# Patient Record
Sex: Male | Born: 1955 | Race: Black or African American | Hispanic: No | Marital: Single | Smoking: Current every day smoker
Health system: Southern US, Community
[De-identification: ages and names within clinical notes are randomized; demographics above are authoritative.]

## PROBLEM LIST (undated history)

## (undated) DIAGNOSIS — F1410 Cocaine abuse, uncomplicated: Secondary | ICD-10-CM

## (undated) DIAGNOSIS — N186 End stage renal disease: Secondary | ICD-10-CM

## (undated) DIAGNOSIS — H269 Unspecified cataract: Secondary | ICD-10-CM

## (undated) DIAGNOSIS — Z992 Dependence on renal dialysis: Secondary | ICD-10-CM

## (undated) DIAGNOSIS — T82898A Other specified complication of vascular prosthetic devices, implants and grafts, initial encounter: Secondary | ICD-10-CM

## (undated) DIAGNOSIS — E119 Type 2 diabetes mellitus without complications: Secondary | ICD-10-CM

## (undated) DIAGNOSIS — E785 Hyperlipidemia, unspecified: Secondary | ICD-10-CM

## (undated) DIAGNOSIS — J189 Pneumonia, unspecified organism: Secondary | ICD-10-CM

## (undated) DIAGNOSIS — Z720 Tobacco use: Secondary | ICD-10-CM

## (undated) DIAGNOSIS — I639 Cerebral infarction, unspecified: Secondary | ICD-10-CM

## (undated) DIAGNOSIS — K922 Gastrointestinal hemorrhage, unspecified: Secondary | ICD-10-CM

## (undated) DIAGNOSIS — K219 Gastro-esophageal reflux disease without esophagitis: Secondary | ICD-10-CM

## (undated) DIAGNOSIS — I82409 Acute embolism and thrombosis of unspecified deep veins of unspecified lower extremity: Secondary | ICD-10-CM

## (undated) DIAGNOSIS — E1110 Type 2 diabetes mellitus with ketoacidosis without coma: Secondary | ICD-10-CM

## (undated) DIAGNOSIS — G4700 Insomnia, unspecified: Secondary | ICD-10-CM

## (undated) DIAGNOSIS — F329 Major depressive disorder, single episode, unspecified: Secondary | ICD-10-CM

## (undated) DIAGNOSIS — J811 Chronic pulmonary edema: Secondary | ICD-10-CM

## (undated) DIAGNOSIS — I509 Heart failure, unspecified: Secondary | ICD-10-CM

## (undated) DIAGNOSIS — Z9289 Personal history of other medical treatment: Secondary | ICD-10-CM

## (undated) DIAGNOSIS — G8929 Other chronic pain: Secondary | ICD-10-CM

## (undated) DIAGNOSIS — E049 Nontoxic goiter, unspecified: Secondary | ICD-10-CM

## (undated) DIAGNOSIS — Z9981 Dependence on supplemental oxygen: Secondary | ICD-10-CM

## (undated) DIAGNOSIS — F32A Depression, unspecified: Secondary | ICD-10-CM

## (undated) DIAGNOSIS — G709 Myoneural disorder, unspecified: Secondary | ICD-10-CM

## (undated) DIAGNOSIS — E039 Hypothyroidism, unspecified: Secondary | ICD-10-CM

## (undated) DIAGNOSIS — J449 Chronic obstructive pulmonary disease, unspecified: Secondary | ICD-10-CM

## (undated) DIAGNOSIS — I10 Essential (primary) hypertension: Secondary | ICD-10-CM

## (undated) HISTORY — DX: Heart failure, unspecified: I50.9

## (undated) HISTORY — DX: Acute embolism and thrombosis of unspecified deep veins of unspecified lower extremity: I82.409

## (undated) HISTORY — PX: ARTERIOVENOUS GRAFT PLACEMENT: SUR1029

## (undated) HISTORY — PX: CAROTID STENT INSERTION: SHX5766

## (undated) HISTORY — PX: EYE SURGERY: SHX253

## (undated) HISTORY — DX: Chronic obstructive pulmonary disease, unspecified: J44.9

## (undated) HISTORY — PX: OTHER SURGICAL HISTORY: SHX169

---

## 2000-07-25 ENCOUNTER — Emergency Department (HOSPITAL_COMMUNITY): Admission: EM | Admit: 2000-07-25 | Discharge: 2000-07-25 | Payer: Self-pay | Admitting: Emergency Medicine

## 2000-09-22 ENCOUNTER — Inpatient Hospital Stay (HOSPITAL_COMMUNITY): Admission: AC | Admit: 2000-09-22 | Discharge: 2000-09-25 | Payer: Self-pay

## 2000-09-22 ENCOUNTER — Encounter: Payer: Self-pay | Admitting: Surgery

## 2000-09-23 ENCOUNTER — Encounter: Payer: Self-pay | Admitting: General Surgery

## 2002-06-21 ENCOUNTER — Inpatient Hospital Stay (HOSPITAL_COMMUNITY): Admission: AD | Admit: 2002-06-21 | Discharge: 2002-06-26 | Payer: Self-pay | Admitting: *Deleted

## 2002-06-25 ENCOUNTER — Encounter: Payer: Self-pay | Admitting: *Deleted

## 2002-06-30 ENCOUNTER — Emergency Department (HOSPITAL_COMMUNITY): Admission: EM | Admit: 2002-06-30 | Discharge: 2002-06-30 | Payer: Self-pay | Admitting: Emergency Medicine

## 2002-06-30 ENCOUNTER — Encounter: Payer: Self-pay | Admitting: Emergency Medicine

## 2004-03-31 ENCOUNTER — Inpatient Hospital Stay (HOSPITAL_COMMUNITY): Admission: EM | Admit: 2004-03-31 | Discharge: 2004-04-06 | Payer: Self-pay | Admitting: Nephrology

## 2004-06-24 ENCOUNTER — Ambulatory Visit: Payer: Self-pay | Admitting: Ophthalmology

## 2004-06-29 ENCOUNTER — Ambulatory Visit: Payer: Self-pay | Admitting: Ophthalmology

## 2008-03-13 ENCOUNTER — Inpatient Hospital Stay (HOSPITAL_COMMUNITY): Admission: EM | Admit: 2008-03-13 | Discharge: 2008-03-30 | Payer: Self-pay | Admitting: Emergency Medicine

## 2008-03-23 ENCOUNTER — Encounter (INDEPENDENT_AMBULATORY_CARE_PROVIDER_SITE_OTHER): Payer: Self-pay | Admitting: Internal Medicine

## 2008-03-24 ENCOUNTER — Encounter (INDEPENDENT_AMBULATORY_CARE_PROVIDER_SITE_OTHER): Payer: Self-pay | Admitting: Internal Medicine

## 2008-03-24 ENCOUNTER — Ambulatory Visit: Payer: Self-pay | Admitting: Vascular Surgery

## 2008-03-25 HISTORY — PX: AV FISTULA PLACEMENT: SHX1204

## 2008-03-26 ENCOUNTER — Ambulatory Visit: Payer: Self-pay | Admitting: Vascular Surgery

## 2008-04-08 ENCOUNTER — Inpatient Hospital Stay (HOSPITAL_COMMUNITY): Admission: EM | Admit: 2008-04-08 | Discharge: 2008-05-01 | Payer: Self-pay | Admitting: Emergency Medicine

## 2008-05-02 ENCOUNTER — Ambulatory Visit: Payer: Self-pay | Admitting: Internal Medicine

## 2008-05-02 ENCOUNTER — Inpatient Hospital Stay (HOSPITAL_COMMUNITY): Admission: EM | Admit: 2008-05-02 | Discharge: 2008-05-21 | Payer: Self-pay | Admitting: Emergency Medicine

## 2008-05-04 ENCOUNTER — Encounter (INDEPENDENT_AMBULATORY_CARE_PROVIDER_SITE_OTHER): Payer: Self-pay | Admitting: Internal Medicine

## 2009-06-02 IMAGING — CR DG CHEST 2V
2 series · 2 of 2 positions shown · non-contrast
Comparison: Chest x-ray of 05/04/2008

CLINICAL DATA: Weakness, shortness of breath, follow-up

CHEST - 2 VIEW

[w chest pa]
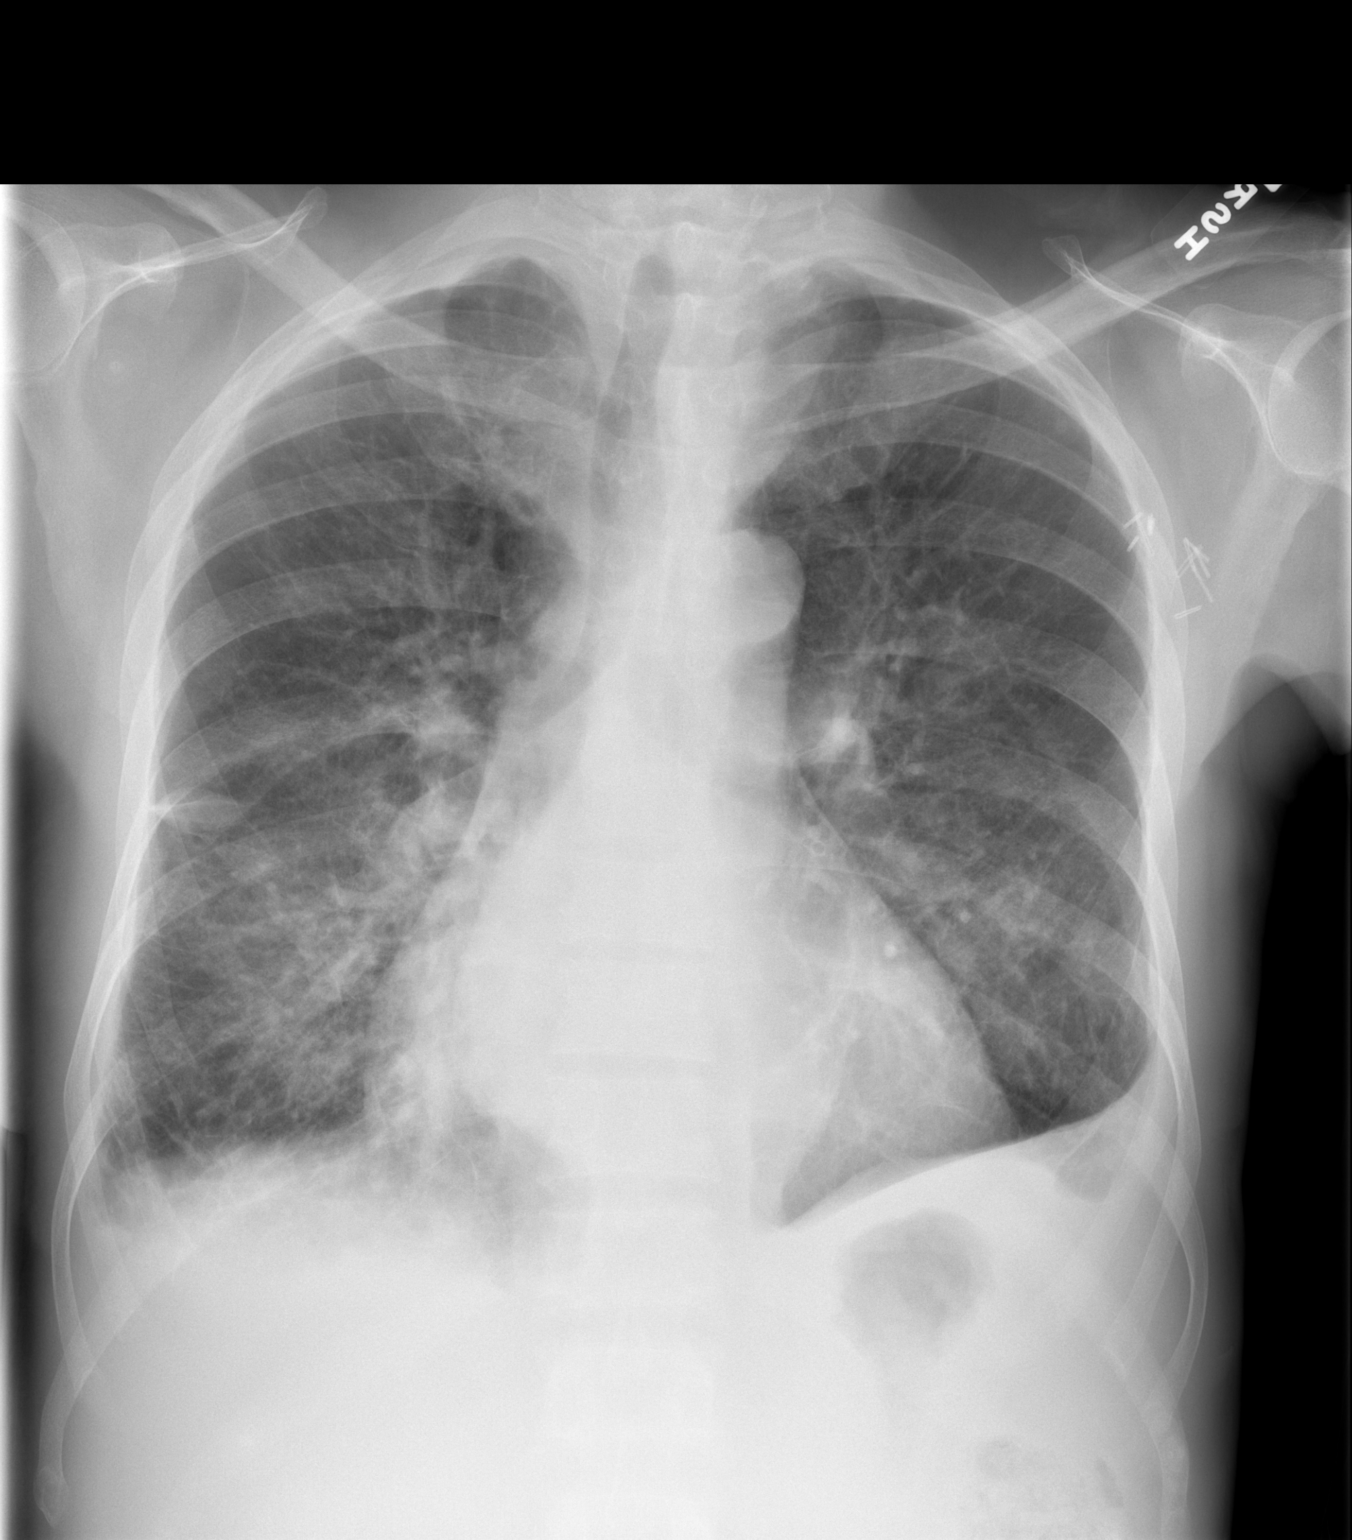

[w chest lat]
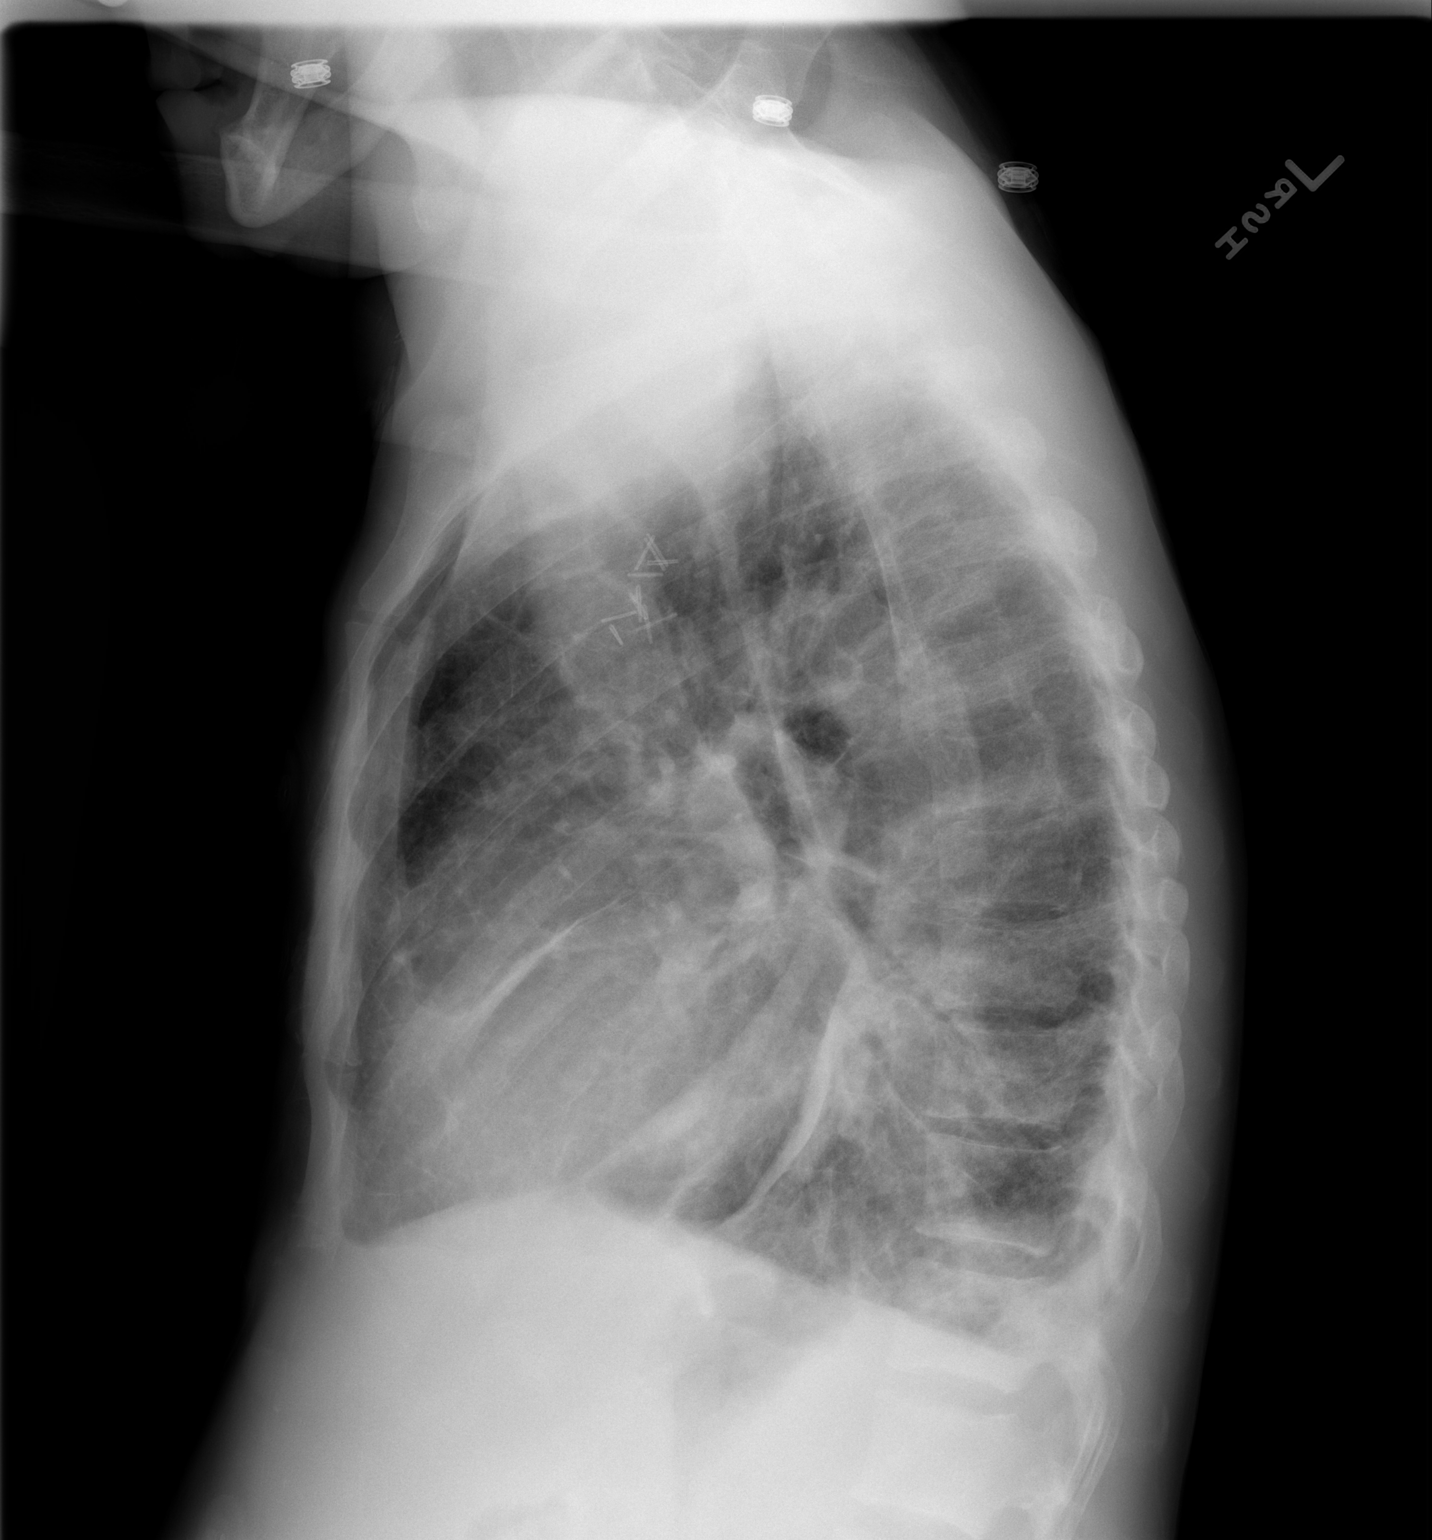

[2 of 2 positions shown; findings below may reference images not displayed]

FINDINGS: There has been some increase in opacity at the right lung
base suspicious for pneumonia primarily involving the right lower
lobe.  There may be a small right effusion present.  Chronic change
at the left base is stable.  Cardiomegaly is stable.
IMPRESSION: Patchy opacity at the right lung base suspicious for pneumonia with
probable small right effusion.

## 2010-02-28 ENCOUNTER — Inpatient Hospital Stay (HOSPITAL_COMMUNITY): Admission: AD | Admit: 2010-02-28 | Discharge: 2010-03-04 | Payer: Self-pay | Admitting: Internal Medicine

## 2010-02-28 ENCOUNTER — Ambulatory Visit: Payer: Self-pay | Admitting: Cardiovascular Disease

## 2010-03-23 ENCOUNTER — Inpatient Hospital Stay (HOSPITAL_COMMUNITY)
Admission: EM | Admit: 2010-03-23 | Discharge: 2010-03-28 | Payer: Self-pay | Attending: Internal Medicine | Admitting: Internal Medicine

## 2010-06-27 LAB — BASIC METABOLIC PANEL
BUN: 18 mg/dL (ref 6–23)
BUN: 24 mg/dL — ABNORMAL HIGH (ref 6–23)
BUN: 28 mg/dL — ABNORMAL HIGH (ref 6–23)
BUN: 37 mg/dL — ABNORMAL HIGH (ref 6–23)
BUN: 40 mg/dL — ABNORMAL HIGH (ref 6–23)
BUN: 41 mg/dL — ABNORMAL HIGH (ref 6–23)
BUN: 42 mg/dL — ABNORMAL HIGH (ref 6–23)
BUN: 43 mg/dL — ABNORMAL HIGH (ref 6–23)
BUN: 45 mg/dL — ABNORMAL HIGH (ref 6–23)
BUN: 50 mg/dL — ABNORMAL HIGH (ref 6–23)
BUN: 98 mg/dL — ABNORMAL HIGH (ref 6–23)
BUN: 99 mg/dL — ABNORMAL HIGH (ref 6–23)
CO2: 19 mEq/L (ref 19–32)
CO2: 19 mEq/L (ref 19–32)
CO2: 24 mEq/L (ref 19–32)
CO2: 25 mEq/L (ref 19–32)
CO2: 25 mEq/L (ref 19–32)
CO2: 25 mEq/L (ref 19–32)
CO2: 26 mEq/L (ref 19–32)
CO2: 26 mEq/L (ref 19–32)
CO2: 27 mEq/L (ref 19–32)
CO2: 27 mEq/L (ref 19–32)
CO2: 27 mEq/L (ref 19–32)
CO2: 28 mEq/L (ref 19–32)
CO2: 29 mEq/L (ref 19–32)
Calcium: 7.9 mg/dL — ABNORMAL LOW (ref 8.4–10.5)
Calcium: 8.1 mg/dL — ABNORMAL LOW (ref 8.4–10.5)
Calcium: 8.7 mg/dL (ref 8.4–10.5)
Calcium: 8.8 mg/dL (ref 8.4–10.5)
Calcium: 9 mg/dL (ref 8.4–10.5)
Calcium: 9 mg/dL (ref 8.4–10.5)
Calcium: 9 mg/dL (ref 8.4–10.5)
Calcium: 9.1 mg/dL (ref 8.4–10.5)
Calcium: 9.2 mg/dL (ref 8.4–10.5)
Calcium: 9.4 mg/dL (ref 8.4–10.5)
Chloride: 100 mEq/L (ref 96–112)
Chloride: 86 mEq/L — ABNORMAL LOW (ref 96–112)
Chloride: 93 mEq/L — ABNORMAL LOW (ref 96–112)
Chloride: 94 mEq/L — ABNORMAL LOW (ref 96–112)
Chloride: 94 mEq/L — ABNORMAL LOW (ref 96–112)
Chloride: 95 mEq/L — ABNORMAL LOW (ref 96–112)
Chloride: 97 mEq/L (ref 96–112)
Chloride: 97 mEq/L (ref 96–112)
Chloride: 97 mEq/L (ref 96–112)
Chloride: 98 mEq/L (ref 96–112)
Chloride: 98 mEq/L (ref 96–112)
Creatinine, Ser: 2.57 mg/dL — ABNORMAL HIGH (ref 0.4–1.5)
Creatinine, Ser: 5.16 mg/dL — ABNORMAL HIGH (ref 0.4–1.5)
Creatinine, Ser: 5.32 mg/dL — ABNORMAL HIGH (ref 0.4–1.5)
Creatinine, Ser: 6.36 mg/dL — ABNORMAL HIGH (ref 0.4–1.5)
Creatinine, Ser: 6.43 mg/dL — ABNORMAL HIGH (ref 0.4–1.5)
Creatinine, Ser: 6.7 mg/dL — ABNORMAL HIGH (ref 0.4–1.5)
Creatinine, Ser: 7.15 mg/dL — ABNORMAL HIGH (ref 0.4–1.5)
Creatinine, Ser: 7.39 mg/dL — ABNORMAL HIGH (ref 0.4–1.5)
Creatinine, Ser: 7.59 mg/dL — ABNORMAL HIGH (ref 0.4–1.5)
Creatinine, Ser: 8.06 mg/dL — ABNORMAL HIGH (ref 0.4–1.5)
GFR calc Af Amer: 10 mL/min — ABNORMAL LOW (ref >60)
GFR calc Af Amer: 11 mL/min — ABNORMAL LOW (ref >60)
GFR calc Af Amer: 14 mL/min — ABNORMAL LOW (ref >60)
GFR calc Af Amer: 18 mL/min — ABNORMAL LOW (ref >60)
GFR calc Af Amer: 19 mL/min — ABNORMAL LOW (ref >60)
GFR calc Af Amer: 23 mL/min — ABNORMAL LOW (ref >60)
GFR calc Af Amer: 32 mL/min — ABNORMAL LOW (ref >60)
GFR calc Af Amer: 9 mL/min — ABNORMAL LOW (ref >60)
GFR calc non Af Amer: 12 mL/min — ABNORMAL LOW (ref >60)
GFR calc non Af Amer: 14 mL/min — ABNORMAL LOW (ref >60)
GFR calc non Af Amer: 15 mL/min — ABNORMAL LOW (ref >60)
GFR calc non Af Amer: 26 mL/min — ABNORMAL LOW (ref >60)
GFR calc non Af Amer: 7 mL/min — ABNORMAL LOW (ref >60)
GFR calc non Af Amer: 8 mL/min — ABNORMAL LOW (ref >60)
GFR calc non Af Amer: 8 mL/min — ABNORMAL LOW (ref >60)
GFR calc non Af Amer: 9 mL/min — ABNORMAL LOW (ref >60)
GFR calc non Af Amer: 9 mL/min — ABNORMAL LOW (ref >60)
GFR calc non Af Amer: 9 mL/min — ABNORMAL LOW (ref >60)
Glucose, Bld: 111 mg/dL — ABNORMAL HIGH (ref 70–99)
Glucose, Bld: 145 mg/dL — ABNORMAL HIGH (ref 70–99)
Glucose, Bld: 145 mg/dL — ABNORMAL HIGH (ref 70–99)
Glucose, Bld: 147 mg/dL — ABNORMAL HIGH (ref 70–99)
Glucose, Bld: 187 mg/dL — ABNORMAL HIGH (ref 70–99)
Glucose, Bld: 243 mg/dL — ABNORMAL HIGH (ref 70–99)
Glucose, Bld: 246 mg/dL — ABNORMAL HIGH (ref 70–99)
Glucose, Bld: 247 mg/dL — ABNORMAL HIGH (ref 70–99)
Glucose, Bld: 268 mg/dL — ABNORMAL HIGH (ref 70–99)
Glucose, Bld: 287 mg/dL — ABNORMAL HIGH (ref 70–99)
Glucose, Bld: 298 mg/dL — ABNORMAL HIGH (ref 70–99)
Glucose, Bld: 622 mg/dL (ref 70–99)
Glucose, Bld: 685 mg/dL (ref 70–99)
Glucose, Bld: 77 mg/dL (ref 70–99)
Potassium: 4.2 mEq/L (ref 3.5–5.1)
Potassium: 4.3 mEq/L (ref 3.5–5.1)
Potassium: 4.3 mEq/L (ref 3.5–5.1)
Potassium: 4.4 mEq/L (ref 3.5–5.1)
Potassium: 4.5 mEq/L (ref 3.5–5.1)
Potassium: 4.5 mEq/L (ref 3.5–5.1)
Potassium: 4.6 mEq/L (ref 3.5–5.1)
Potassium: 4.6 mEq/L (ref 3.5–5.1)
Potassium: 4.7 mEq/L (ref 3.5–5.1)
Potassium: 4.8 mEq/L (ref 3.5–5.1)
Potassium: 4.9 mEq/L (ref 3.5–5.1)
Potassium: 7.1 mEq/L (ref 3.5–5.1)
Potassium: 7.2 mEq/L (ref 3.5–5.1)
Sodium: 129 mEq/L — ABNORMAL LOW (ref 135–145)
Sodium: 130 mEq/L — ABNORMAL LOW (ref 135–145)
Sodium: 132 mEq/L — ABNORMAL LOW (ref 135–145)
Sodium: 132 mEq/L — ABNORMAL LOW (ref 135–145)
Sodium: 133 mEq/L — ABNORMAL LOW (ref 135–145)
Sodium: 137 mEq/L (ref 135–145)
Sodium: 137 mEq/L (ref 135–145)
Sodium: 137 mEq/L (ref 135–145)
Sodium: 138 mEq/L (ref 135–145)
Sodium: 140 mEq/L (ref 135–145)

## 2010-06-27 LAB — RENAL FUNCTION PANEL
BUN: 64 mg/dL — ABNORMAL HIGH (ref 6–23)
CO2: 24 mEq/L (ref 19–32)
Chloride: 92 mEq/L — ABNORMAL LOW (ref 96–112)
Creatinine, Ser: 8.3 mg/dL — ABNORMAL HIGH (ref 0.4–1.5)
GFR calc Af Amer: 8 mL/min — ABNORMAL LOW (ref >60)
GFR calc non Af Amer: 7 mL/min — ABNORMAL LOW (ref >60)

## 2010-06-27 LAB — GLUCOSE, CAPILLARY
Glucose-Capillary: 100 mg/dL — ABNORMAL HIGH (ref 70–99)
Glucose-Capillary: 100 mg/dL — ABNORMAL HIGH (ref 70–99)
Glucose-Capillary: 107 mg/dL — ABNORMAL HIGH (ref 70–99)
Glucose-Capillary: 135 mg/dL — ABNORMAL HIGH (ref 70–99)
Glucose-Capillary: 135 mg/dL — ABNORMAL HIGH (ref 70–99)
Glucose-Capillary: 138 mg/dL — ABNORMAL HIGH (ref 70–99)
Glucose-Capillary: 139 mg/dL — ABNORMAL HIGH (ref 70–99)
Glucose-Capillary: 140 mg/dL — ABNORMAL HIGH (ref 70–99)
Glucose-Capillary: 146 mg/dL — ABNORMAL HIGH (ref 70–99)
Glucose-Capillary: 147 mg/dL — ABNORMAL HIGH (ref 70–99)
Glucose-Capillary: 155 mg/dL — ABNORMAL HIGH (ref 70–99)
Glucose-Capillary: 158 mg/dL — ABNORMAL HIGH (ref 70–99)
Glucose-Capillary: 162 mg/dL — ABNORMAL HIGH (ref 70–99)
Glucose-Capillary: 165 mg/dL — ABNORMAL HIGH (ref 70–99)
Glucose-Capillary: 174 mg/dL — ABNORMAL HIGH (ref 70–99)
Glucose-Capillary: 183 mg/dL — ABNORMAL HIGH (ref 70–99)
Glucose-Capillary: 195 mg/dL — ABNORMAL HIGH (ref 70–99)
Glucose-Capillary: 214 mg/dL — ABNORMAL HIGH (ref 70–99)
Glucose-Capillary: 214 mg/dL — ABNORMAL HIGH (ref 70–99)
Glucose-Capillary: 299 mg/dL — ABNORMAL HIGH (ref 70–99)
Glucose-Capillary: 305 mg/dL — ABNORMAL HIGH (ref 70–99)
Glucose-Capillary: 442 mg/dL — ABNORMAL HIGH (ref 70–99)
Glucose-Capillary: 449 mg/dL — ABNORMAL HIGH (ref 70–99)
Glucose-Capillary: 517 mg/dL — ABNORMAL HIGH (ref 70–99)
Glucose-Capillary: 63 mg/dL — ABNORMAL LOW (ref 70–99)
Glucose-Capillary: 65 mg/dL — ABNORMAL LOW (ref 70–99)
Glucose-Capillary: 69 mg/dL — ABNORMAL LOW (ref 70–99)
Glucose-Capillary: 71 mg/dL (ref 70–99)
Glucose-Capillary: 89 mg/dL (ref 70–99)
Glucose-Capillary: >600 mg/dL (ref 70–99)

## 2010-06-27 LAB — CBC
HCT: 35.7 % — ABNORMAL LOW (ref 39.0–52.0)
HCT: 37.1 % — ABNORMAL LOW (ref 39.0–52.0)
Hemoglobin: 11.2 g/dL — ABNORMAL LOW (ref 13.0–17.0)
Hemoglobin: 12.1 g/dL — ABNORMAL LOW (ref 13.0–17.0)
MCH: 29.9 pg (ref 26.0–34.0)
MCV: 91.6 fL (ref 78.0–100.0)
Platelets: 232 10*3/uL (ref 150–400)
RBC: 4.05 MIL/uL — ABNORMAL LOW (ref 4.22–5.81)
RBC: 4.21 MIL/uL — ABNORMAL LOW (ref 4.22–5.81)
RDW: 16.9 % — ABNORMAL HIGH (ref 11.5–15.5)
RDW: 17 % — ABNORMAL HIGH (ref 11.5–15.5)
WBC: 4.8 10*3/uL (ref 4.0–10.5)
WBC: 8.9 10*3/uL (ref 4.0–10.5)

## 2010-06-27 LAB — APTT: aPTT: 32 seconds (ref 24–37)

## 2010-06-27 LAB — PROTIME-INR
INR: 1.14 (ref 0.00–1.49)
Prothrombin Time: 14.8 seconds (ref 11.6–15.2)

## 2010-06-27 LAB — CULTURE, BLOOD (ROUTINE X 2): Culture: NO GROWTH

## 2010-06-27 LAB — DRUG SCREEN PANEL (SERUM)

## 2010-06-27 LAB — CARDIAC PANEL(CRET KIN+CKTOT+MB+TROPI)
CK, MB: 3.5 ng/mL (ref 0.3–4.0)
CK, MB: 3.8 ng/mL (ref 0.3–4.0)
Relative Index: INVALID (ref 0.0–2.5)
Total CK: 57 U/L (ref 7–232)
Total CK: 64 U/L (ref 7–232)
Troponin I: 0.02 ng/mL (ref 0.00–0.06)
Troponin I: 0.08 ng/mL — ABNORMAL HIGH (ref 0.00–0.06)

## 2010-06-27 LAB — BRAIN NATRIURETIC PEPTIDE: Pro B Natriuretic peptide (BNP): 2285 pg/mL — ABNORMAL HIGH (ref 0.0–100.0)

## 2010-06-27 LAB — MAGNESIUM: Magnesium: 2 mg/dL (ref 1.5–2.5)

## 2010-06-27 LAB — GLUCOSE, RANDOM: Glucose, Bld: 707 mg/dL (ref 70–99)

## 2010-06-27 LAB — HEPATITIS B SURFACE ANTIGEN: Hepatitis B Surface Ag: NEGATIVE

## 2010-06-27 LAB — LACTIC ACID, PLASMA: Lactic Acid, Venous: 1.6 mmol/L (ref 0.5–2.2)

## 2010-06-27 LAB — PHOSPHORUS: Phosphorus: 7.2 mg/dL — ABNORMAL HIGH (ref 2.3–4.6)

## 2010-06-28 LAB — GLUCOSE, CAPILLARY
Glucose-Capillary: 119 mg/dL — ABNORMAL HIGH (ref 70–99)
Glucose-Capillary: 122 mg/dL — ABNORMAL HIGH (ref 70–99)
Glucose-Capillary: 125 mg/dL — ABNORMAL HIGH (ref 70–99)
Glucose-Capillary: 156 mg/dL — ABNORMAL HIGH (ref 70–99)
Glucose-Capillary: 159 mg/dL — ABNORMAL HIGH (ref 70–99)
Glucose-Capillary: 186 mg/dL — ABNORMAL HIGH (ref 70–99)
Glucose-Capillary: 207 mg/dL — ABNORMAL HIGH (ref 70–99)
Glucose-Capillary: 248 mg/dL — ABNORMAL HIGH (ref 70–99)
Glucose-Capillary: 265 mg/dL — ABNORMAL HIGH (ref 70–99)
Glucose-Capillary: 313 mg/dL — ABNORMAL HIGH (ref 70–99)
Glucose-Capillary: 313 mg/dL — ABNORMAL HIGH (ref 70–99)
Glucose-Capillary: 344 mg/dL — ABNORMAL HIGH (ref 70–99)
Glucose-Capillary: 370 mg/dL — ABNORMAL HIGH (ref 70–99)
Glucose-Capillary: 38 mg/dL — CL (ref 70–99)
Glucose-Capillary: 525 mg/dL — ABNORMAL HIGH (ref 70–99)
Glucose-Capillary: 582 mg/dL (ref 70–99)
Glucose-Capillary: 96 mg/dL (ref 70–99)

## 2010-06-28 LAB — CARDIAC PANEL(CRET KIN+CKTOT+MB+TROPI)
CK, MB: 1.4 ng/mL (ref 0.3–4.0)
CK, MB: 2 ng/mL (ref 0.3–4.0)
Relative Index: INVALID (ref 0.0–2.5)
Relative Index: INVALID (ref 0.0–2.5)
Total CK: 54 U/L (ref 7–232)

## 2010-06-28 LAB — CULTURE, BLOOD (ROUTINE X 2)
Culture  Setup Time: 201111150825
Culture: NO GROWTH

## 2010-06-28 LAB — LIPID PANEL
HDL: 34 mg/dL — ABNORMAL LOW (ref >39)
Total CHOL/HDL Ratio: 2.6 RATIO
VLDL: 19 mg/dL (ref 0–40)

## 2010-06-28 LAB — RENAL FUNCTION PANEL
BUN: 18 mg/dL (ref 6–23)
CO2: 24 mEq/L (ref 19–32)
CO2: 29 mEq/L (ref 19–32)
Calcium: 8.8 mg/dL (ref 8.4–10.5)
Calcium: 8.9 mg/dL (ref 8.4–10.5)
Calcium: 9.5 mg/dL (ref 8.4–10.5)
GFR calc Af Amer: 22 mL/min — ABNORMAL LOW (ref >60)
GFR calc non Af Amer: 18 mL/min — ABNORMAL LOW (ref >60)
Glucose, Bld: 367 mg/dL — ABNORMAL HIGH (ref 70–99)
Glucose, Bld: 409 mg/dL — ABNORMAL HIGH (ref 70–99)
Phosphorus: 1.7 mg/dL — ABNORMAL LOW (ref 2.3–4.6)
Phosphorus: 3.4 mg/dL (ref 2.3–4.6)
Potassium: 4 mEq/L (ref 3.5–5.1)
Sodium: 125 mEq/L — ABNORMAL LOW (ref 135–145)
Sodium: 133 mEq/L — ABNORMAL LOW (ref 135–145)

## 2010-06-28 LAB — CBC
HCT: 31.2 % — ABNORMAL LOW (ref 39.0–52.0)
HCT: 33.3 % — ABNORMAL LOW (ref 39.0–52.0)
Hemoglobin: 10.1 g/dL — ABNORMAL LOW (ref 13.0–17.0)
MCH: 28.8 pg (ref 26.0–34.0)
MCH: 29.6 pg (ref 26.0–34.0)
MCHC: 32.4 g/dL (ref 30.0–36.0)
MCV: 95.1 fL (ref 78.0–100.0)
Platelets: 230 10*3/uL (ref 150–400)
RBC: 3.26 MIL/uL — ABNORMAL LOW (ref 4.22–5.81)
RBC: 3.52 MIL/uL — ABNORMAL LOW (ref 4.22–5.81)
RDW: 16.2 % — ABNORMAL HIGH (ref 11.5–15.5)
WBC: 4.5 10*3/uL (ref 4.0–10.5)

## 2010-06-28 LAB — HEMOGLOBIN A1C
Hgb A1c MFr Bld: 7.2 % — ABNORMAL HIGH (ref <5.7)
Mean Plasma Glucose: 160 mg/dL — ABNORMAL HIGH (ref <117)

## 2010-06-28 LAB — COMPREHENSIVE METABOLIC PANEL
Albumin: 2.9 g/dL — ABNORMAL LOW (ref 3.5–5.2)
BUN: 28 mg/dL — ABNORMAL HIGH (ref 6–23)
CO2: 30 mEq/L (ref 19–32)
Chloride: 90 mEq/L — ABNORMAL LOW (ref 96–112)
Creatinine, Ser: 5.68 mg/dL — ABNORMAL HIGH (ref 0.4–1.5)
GFR calc non Af Amer: 10 mL/min — ABNORMAL LOW (ref >60)
Glucose, Bld: 523 mg/dL — ABNORMAL HIGH (ref 70–99)
Total Bilirubin: 0.4 mg/dL (ref 0.3–1.2)

## 2010-06-28 LAB — MRSA PCR SCREENING: MRSA by PCR: NEGATIVE

## 2010-06-28 LAB — MAGNESIUM: Magnesium: 2 mg/dL (ref 1.5–2.5)

## 2010-08-01 LAB — CROSSMATCH: ABO/RH(D): A POS

## 2010-08-01 LAB — GLUCOSE, CAPILLARY
Comment 3: 110041268
Glucose-Capillary: 102 mg/dL — ABNORMAL HIGH (ref 70–99)
Glucose-Capillary: 109 mg/dL — ABNORMAL HIGH (ref 70–99)
Glucose-Capillary: 112 mg/dL — ABNORMAL HIGH (ref 70–99)
Glucose-Capillary: 116 mg/dL — ABNORMAL HIGH (ref 70–99)
Glucose-Capillary: 125 mg/dL — ABNORMAL HIGH (ref 70–99)
Glucose-Capillary: 151 mg/dL — ABNORMAL HIGH (ref 70–99)
Glucose-Capillary: 153 mg/dL — ABNORMAL HIGH (ref 70–99)
Glucose-Capillary: 156 mg/dL — ABNORMAL HIGH (ref 70–99)
Glucose-Capillary: 160 mg/dL — ABNORMAL HIGH (ref 70–99)
Glucose-Capillary: 161 mg/dL — ABNORMAL HIGH (ref 70–99)
Glucose-Capillary: 171 mg/dL — ABNORMAL HIGH (ref 70–99)
Glucose-Capillary: 172 mg/dL — ABNORMAL HIGH (ref 70–99)
Glucose-Capillary: 178 mg/dL — ABNORMAL HIGH (ref 70–99)
Glucose-Capillary: 180 mg/dL — ABNORMAL HIGH (ref 70–99)
Glucose-Capillary: 210 mg/dL — ABNORMAL HIGH (ref 70–99)
Glucose-Capillary: 234 mg/dL — ABNORMAL HIGH (ref 70–99)
Glucose-Capillary: 249 mg/dL — ABNORMAL HIGH (ref 70–99)
Glucose-Capillary: 253 mg/dL — ABNORMAL HIGH (ref 70–99)
Glucose-Capillary: 255 mg/dL — ABNORMAL HIGH (ref 70–99)
Glucose-Capillary: 264 mg/dL — ABNORMAL HIGH (ref 70–99)
Glucose-Capillary: 281 mg/dL — ABNORMAL HIGH (ref 70–99)
Glucose-Capillary: 287 mg/dL — ABNORMAL HIGH (ref 70–99)
Glucose-Capillary: 295 mg/dL — ABNORMAL HIGH (ref 70–99)
Glucose-Capillary: 297 mg/dL — ABNORMAL HIGH (ref 70–99)
Glucose-Capillary: 312 mg/dL — ABNORMAL HIGH (ref 70–99)
Glucose-Capillary: 340 mg/dL — ABNORMAL HIGH (ref 70–99)
Glucose-Capillary: 379 mg/dL — ABNORMAL HIGH (ref 70–99)
Glucose-Capillary: 394 mg/dL — ABNORMAL HIGH (ref 70–99)
Glucose-Capillary: 473 mg/dL — ABNORMAL HIGH (ref 70–99)
Glucose-Capillary: 570 mg/dL (ref 70–99)
Glucose-Capillary: 59 mg/dL — ABNORMAL LOW (ref 70–99)
Glucose-Capillary: 66 mg/dL — ABNORMAL LOW (ref 70–99)
Glucose-Capillary: 66 mg/dL — ABNORMAL LOW (ref 70–99)
Glucose-Capillary: 71 mg/dL (ref 70–99)
Glucose-Capillary: 81 mg/dL (ref 70–99)
Glucose-Capillary: 83 mg/dL (ref 70–99)
Glucose-Capillary: >600 mg/dL (ref 70–99)
Glucose-Capillary: >600 mg/dL (ref 70–99)
Glucose-Capillary: >600 mg/dL (ref 70–99)
Glucose-Capillary: 108 mg/dL — ABNORMAL HIGH (ref 70–99)
Glucose-Capillary: 113 mg/dL — ABNORMAL HIGH (ref 70–99)
Glucose-Capillary: 113 mg/dL — ABNORMAL HIGH (ref 70–99)
Glucose-Capillary: 130 mg/dL — ABNORMAL HIGH (ref 70–99)
Glucose-Capillary: 130 mg/dL — ABNORMAL HIGH (ref 70–99)
Glucose-Capillary: 143 mg/dL — ABNORMAL HIGH (ref 70–99)
Glucose-Capillary: 144 mg/dL — ABNORMAL HIGH (ref 70–99)
Glucose-Capillary: 155 mg/dL — ABNORMAL HIGH (ref 70–99)
Glucose-Capillary: 160 mg/dL — ABNORMAL HIGH (ref 70–99)
Glucose-Capillary: 162 mg/dL — ABNORMAL HIGH (ref 70–99)
Glucose-Capillary: 178 mg/dL — ABNORMAL HIGH (ref 70–99)
Glucose-Capillary: 200 mg/dL — ABNORMAL HIGH (ref 70–99)
Glucose-Capillary: 201 mg/dL — ABNORMAL HIGH (ref 70–99)
Glucose-Capillary: 218 mg/dL — ABNORMAL HIGH (ref 70–99)
Glucose-Capillary: 223 mg/dL — ABNORMAL HIGH (ref 70–99)
Glucose-Capillary: 228 mg/dL — ABNORMAL HIGH (ref 70–99)
Glucose-Capillary: 242 mg/dL — ABNORMAL HIGH (ref 70–99)
Glucose-Capillary: 245 mg/dL — ABNORMAL HIGH (ref 70–99)
Glucose-Capillary: 257 mg/dL — ABNORMAL HIGH (ref 70–99)
Glucose-Capillary: 279 mg/dL — ABNORMAL HIGH (ref 70–99)
Glucose-Capillary: 280 mg/dL — ABNORMAL HIGH (ref 70–99)
Glucose-Capillary: 304 mg/dL — ABNORMAL HIGH (ref 70–99)
Glucose-Capillary: 307 mg/dL — ABNORMAL HIGH (ref 70–99)
Glucose-Capillary: 318 mg/dL — ABNORMAL HIGH (ref 70–99)
Glucose-Capillary: 322 mg/dL — ABNORMAL HIGH (ref 70–99)
Glucose-Capillary: 329 mg/dL — ABNORMAL HIGH (ref 70–99)
Glucose-Capillary: 345 mg/dL — ABNORMAL HIGH (ref 70–99)
Glucose-Capillary: 350 mg/dL — ABNORMAL HIGH (ref 70–99)
Glucose-Capillary: 353 mg/dL — ABNORMAL HIGH (ref 70–99)
Glucose-Capillary: 377 mg/dL — ABNORMAL HIGH (ref 70–99)
Glucose-Capillary: 397 mg/dL — ABNORMAL HIGH (ref 70–99)
Glucose-Capillary: 407 mg/dL — ABNORMAL HIGH (ref 70–99)
Glucose-Capillary: 415 mg/dL — ABNORMAL HIGH (ref 70–99)
Glucose-Capillary: 427 mg/dL — ABNORMAL HIGH (ref 70–99)
Glucose-Capillary: 428 mg/dL — ABNORMAL HIGH (ref 70–99)
Glucose-Capillary: 433 mg/dL — ABNORMAL HIGH (ref 70–99)
Glucose-Capillary: 460 mg/dL — ABNORMAL HIGH (ref 70–99)
Glucose-Capillary: 460 mg/dL — ABNORMAL HIGH (ref 70–99)
Glucose-Capillary: 470 mg/dL — ABNORMAL HIGH (ref 70–99)
Glucose-Capillary: 482 mg/dL — ABNORMAL HIGH (ref 70–99)
Glucose-Capillary: 54 mg/dL — ABNORMAL LOW (ref 70–99)
Glucose-Capillary: >600 mg/dL (ref 70–99)

## 2010-08-01 LAB — CBC
HCT: 27.4 % — ABNORMAL LOW (ref 39.0–52.0)
HCT: 31.1 % — ABNORMAL LOW (ref 39.0–52.0)
HCT: 31.3 % — ABNORMAL LOW (ref 39.0–52.0)
HCT: 34.7 % — ABNORMAL LOW (ref 39.0–52.0)
HCT: 23 % — ABNORMAL LOW (ref 39.0–52.0)
HCT: 24.4 % — ABNORMAL LOW (ref 39.0–52.0)
HCT: 31 % — ABNORMAL LOW (ref 39.0–52.0)
Hemoglobin: 12.6 g/dL — ABNORMAL LOW (ref 13.0–17.0)
Hemoglobin: 8.8 g/dL — ABNORMAL LOW (ref 13.0–17.0)
Hemoglobin: 8.9 g/dL — ABNORMAL LOW (ref 13.0–17.0)
Hemoglobin: 9 g/dL — ABNORMAL LOW (ref 13.0–17.0)
Hemoglobin: 10.4 g/dL — ABNORMAL LOW (ref 13.0–17.0)
Hemoglobin: 7.8 g/dL — CL (ref 13.0–17.0)
Hemoglobin: 8.1 g/dL — ABNORMAL LOW (ref 13.0–17.0)
MCHC: 32.2 g/dL (ref 30.0–36.0)
MCHC: 32.2 g/dL (ref 30.0–36.0)
MCHC: 32.7 g/dL (ref 30.0–36.0)
MCHC: 32.8 g/dL (ref 30.0–36.0)
MCHC: 33.2 g/dL (ref 30.0–36.0)
MCHC: 33.4 g/dL (ref 30.0–36.0)
MCHC: 33.7 g/dL (ref 30.0–36.0)
MCV: 86 fL (ref 78.0–100.0)
MCV: 87.2 fL (ref 78.0–100.0)
MCV: 87.3 fL (ref 78.0–100.0)
MCV: 88.4 fL (ref 78.0–100.0)
MCV: 88.8 fL (ref 78.0–100.0)
MCV: 86.3 fL (ref 78.0–100.0)
MCV: 87.2 fL (ref 78.0–100.0)
Platelets: 301 10*3/uL (ref 150–400)
Platelets: 312 10*3/uL (ref 150–400)
Platelets: 324 10*3/uL (ref 150–400)
Platelets: 333 10*3/uL (ref 150–400)
Platelets: 356 10*3/uL (ref 150–400)
Platelets: 255 10*3/uL (ref 150–400)
Platelets: 272 10*3/uL (ref 150–400)
Platelets: 299 10*3/uL (ref 150–400)
RBC: 3.01 MIL/uL — ABNORMAL LOW (ref 4.22–5.81)
RBC: 3.64 MIL/uL — ABNORMAL LOW (ref 4.22–5.81)
RBC: 3.72 MIL/uL — ABNORMAL LOW (ref 4.22–5.81)
RBC: 4.43 MIL/uL (ref 4.22–5.81)
RBC: 4.7 MIL/uL (ref 4.22–5.81)
RBC: 2.78 MIL/uL — ABNORMAL LOW (ref 4.22–5.81)
RBC: 2.82 MIL/uL — ABNORMAL LOW (ref 4.22–5.81)
RBC: 2.87 MIL/uL — ABNORMAL LOW (ref 4.22–5.81)
RBC: 3.1 MIL/uL — ABNORMAL LOW (ref 4.22–5.81)
RBC: 3.15 MIL/uL — ABNORMAL LOW (ref 4.22–5.81)
RBC: 3.5 MIL/uL — ABNORMAL LOW (ref 4.22–5.81)
RDW: 17.5 % — ABNORMAL HIGH (ref 11.5–15.5)
RDW: 17.7 % — ABNORMAL HIGH (ref 11.5–15.5)
RDW: 18.1 % — ABNORMAL HIGH (ref 11.5–15.5)
RDW: 18.3 % — ABNORMAL HIGH (ref 11.5–15.5)
RDW: 18.4 % — ABNORMAL HIGH (ref 11.5–15.5)
RDW: 19.5 % — ABNORMAL HIGH (ref 11.5–15.5)
RDW: 20.3 % — ABNORMAL HIGH (ref 11.5–15.5)
RDW: 17.4 % — ABNORMAL HIGH (ref 11.5–15.5)
RDW: 18 % — ABNORMAL HIGH (ref 11.5–15.5)
RDW: 18.2 % — ABNORMAL HIGH (ref 11.5–15.5)
RDW: 18.9 % — ABNORMAL HIGH (ref 11.5–15.5)
RDW: 19.4 % — ABNORMAL HIGH (ref 11.5–15.5)
WBC: 5 10*3/uL (ref 4.0–10.5)
WBC: 5.3 10*3/uL (ref 4.0–10.5)
WBC: 5.4 10*3/uL (ref 4.0–10.5)
WBC: 3.2 10*3/uL — ABNORMAL LOW (ref 4.0–10.5)
WBC: 6.1 10*3/uL (ref 4.0–10.5)
WBC: 6.2 10*3/uL (ref 4.0–10.5)
WBC: 7.3 10*3/uL (ref 4.0–10.5)

## 2010-08-01 LAB — RENAL FUNCTION PANEL
Albumin: 2.5 g/dL — ABNORMAL LOW (ref 3.5–5.2)
Albumin: 2.6 g/dL — ABNORMAL LOW (ref 3.5–5.2)
Albumin: 2.7 g/dL — ABNORMAL LOW (ref 3.5–5.2)
Albumin: 2.7 g/dL — ABNORMAL LOW (ref 3.5–5.2)
Albumin: 2.8 g/dL — ABNORMAL LOW (ref 3.5–5.2)
Albumin: 2.9 g/dL — ABNORMAL LOW (ref 3.5–5.2)
Albumin: 2.9 g/dL — ABNORMAL LOW (ref 3.5–5.2)
Albumin: 2.8 g/dL — ABNORMAL LOW (ref 3.5–5.2)
Albumin: 2.8 g/dL — ABNORMAL LOW (ref 3.5–5.2)
Albumin: 2.9 g/dL — ABNORMAL LOW (ref 3.5–5.2)
BUN: 106 mg/dL — ABNORMAL HIGH (ref 6–23)
BUN: 107 mg/dL — ABNORMAL HIGH (ref 6–23)
BUN: 68 mg/dL — ABNORMAL HIGH (ref 6–23)
BUN: 94 mg/dL — ABNORMAL HIGH (ref 6–23)
CO2: 23 mEq/L (ref 19–32)
CO2: 24 mEq/L (ref 19–32)
CO2: 24 mEq/L (ref 19–32)
CO2: 24 mEq/L (ref 19–32)
CO2: 24 mEq/L (ref 19–32)
CO2: 25 mEq/L (ref 19–32)
CO2: 23 mEq/L (ref 19–32)
CO2: 24 mEq/L (ref 19–32)
CO2: 26 mEq/L (ref 19–32)
CO2: 28 mEq/L (ref 19–32)
Calcium: 8.9 mg/dL (ref 8.4–10.5)
Calcium: 9.1 mg/dL (ref 8.4–10.5)
Calcium: 8.9 mg/dL (ref 8.4–10.5)
Calcium: 9.1 mg/dL (ref 8.4–10.5)
Calcium: 9.1 mg/dL (ref 8.4–10.5)
Chloride: 83 mEq/L — ABNORMAL LOW (ref 96–112)
Chloride: 89 mEq/L — ABNORMAL LOW (ref 96–112)
Chloride: 91 mEq/L — ABNORMAL LOW (ref 96–112)
Chloride: 92 mEq/L — ABNORMAL LOW (ref 96–112)
Chloride: 93 mEq/L — ABNORMAL LOW (ref 96–112)
Chloride: 96 mEq/L (ref 96–112)
Chloride: 97 mEq/L (ref 96–112)
Chloride: 87 mEq/L — ABNORMAL LOW (ref 96–112)
Chloride: 91 mEq/L — ABNORMAL LOW (ref 96–112)
Chloride: 94 mEq/L — ABNORMAL LOW (ref 96–112)
Creatinine, Ser: 3.89 mg/dL — ABNORMAL HIGH (ref 0.4–1.5)
Creatinine, Ser: 4.2 mg/dL — ABNORMAL HIGH (ref 0.4–1.5)
Creatinine, Ser: 4.39 mg/dL — ABNORMAL HIGH (ref 0.4–1.5)
Creatinine, Ser: 4.62 mg/dL — ABNORMAL HIGH (ref 0.4–1.5)
Creatinine, Ser: 5.36 mg/dL — ABNORMAL HIGH (ref 0.4–1.5)
Creatinine, Ser: 5.74 mg/dL — ABNORMAL HIGH (ref 0.4–1.5)
Creatinine, Ser: 6.17 mg/dL — ABNORMAL HIGH (ref 0.4–1.5)
Creatinine, Ser: 3.5 mg/dL — ABNORMAL HIGH (ref 0.4–1.5)
Creatinine, Ser: 4.4 mg/dL — ABNORMAL HIGH (ref 0.4–1.5)
GFR calc Af Amer: 15 mL/min — ABNORMAL LOW (ref >60)
GFR calc Af Amer: 15 mL/min — ABNORMAL LOW (ref >60)
GFR calc Af Amer: 15 mL/min — ABNORMAL LOW (ref >60)
GFR calc Af Amer: 16 mL/min — ABNORMAL LOW (ref >60)
GFR calc Af Amer: 17 mL/min — ABNORMAL LOW (ref >60)
GFR calc Af Amer: 20 mL/min — ABNORMAL LOW (ref >60)
GFR calc Af Amer: 14 mL/min — ABNORMAL LOW (ref >60)
GFR calc Af Amer: 15 mL/min — ABNORMAL LOW (ref >60)
GFR calc Af Amer: 17 mL/min — ABNORMAL LOW (ref >60)
GFR calc Af Amer: 22 mL/min — ABNORMAL LOW (ref >60)
GFR calc Af Amer: 23 mL/min — ABNORMAL LOW (ref >60)
GFR calc non Af Amer: 10 mL/min — ABNORMAL LOW (ref >60)
GFR calc non Af Amer: 11 mL/min — ABNORMAL LOW (ref >60)
GFR calc non Af Amer: 12 mL/min — ABNORMAL LOW (ref >60)
GFR calc non Af Amer: 12 mL/min — ABNORMAL LOW (ref >60)
GFR calc non Af Amer: 13 mL/min — ABNORMAL LOW (ref >60)
GFR calc non Af Amer: 13 mL/min — ABNORMAL LOW (ref >60)
GFR calc non Af Amer: 14 mL/min — ABNORMAL LOW (ref >60)
GFR calc non Af Amer: 16 mL/min — ABNORMAL LOW (ref >60)
GFR calc non Af Amer: 11 mL/min — ABNORMAL LOW (ref >60)
GFR calc non Af Amer: 12 mL/min — ABNORMAL LOW (ref >60)
GFR calc non Af Amer: 14 mL/min — ABNORMAL LOW (ref >60)
GFR calc non Af Amer: 18 mL/min — ABNORMAL LOW (ref >60)
GFR calc non Af Amer: 19 mL/min — ABNORMAL LOW (ref >60)
Glucose, Bld: 271 mg/dL — ABNORMAL HIGH (ref 70–99)
Glucose, Bld: 345 mg/dL — ABNORMAL HIGH (ref 70–99)
Glucose, Bld: 302 mg/dL — ABNORMAL HIGH (ref 70–99)
Glucose, Bld: 84 mg/dL (ref 70–99)
Phosphorus: 3.6 mg/dL (ref 2.3–4.6)
Phosphorus: 3.7 mg/dL (ref 2.3–4.6)
Phosphorus: 3.8 mg/dL (ref 2.3–4.6)
Phosphorus: 4 mg/dL (ref 2.3–4.6)
Phosphorus: 4.2 mg/dL (ref 2.3–4.6)
Potassium: 4.3 mEq/L (ref 3.5–5.1)
Potassium: 4.3 mEq/L (ref 3.5–5.1)
Potassium: 4.3 mEq/L (ref 3.5–5.1)
Potassium: 4.6 mEq/L (ref 3.5–5.1)
Potassium: 4.9 mEq/L (ref 3.5–5.1)
Potassium: 6.6 mEq/L (ref 3.5–5.1)
Potassium: 4.4 mEq/L (ref 3.5–5.1)
Potassium: 4.5 mEq/L (ref 3.5–5.1)
Potassium: 5.1 mEq/L (ref 3.5–5.1)
Potassium: 5.1 mEq/L (ref 3.5–5.1)
Sodium: 128 mEq/L — ABNORMAL LOW (ref 135–145)
Sodium: 128 mEq/L — ABNORMAL LOW (ref 135–145)
Sodium: 128 mEq/L — ABNORMAL LOW (ref 135–145)
Sodium: 129 mEq/L — ABNORMAL LOW (ref 135–145)
Sodium: 131 mEq/L — ABNORMAL LOW (ref 135–145)
Sodium: 126 mEq/L — ABNORMAL LOW (ref 135–145)
Sodium: 128 mEq/L — ABNORMAL LOW (ref 135–145)
Sodium: 130 mEq/L — ABNORMAL LOW (ref 135–145)
Sodium: 131 mEq/L — ABNORMAL LOW (ref 135–145)
Sodium: 131 mEq/L — ABNORMAL LOW (ref 135–145)

## 2010-08-01 LAB — CULTURE, BLOOD (ROUTINE X 2)
Culture: NO GROWTH
Culture: NO GROWTH
Culture: NO GROWTH
Culture: NO GROWTH

## 2010-08-01 LAB — BASIC METABOLIC PANEL
BUN: 41 mg/dL — ABNORMAL HIGH (ref 6–23)
BUN: 103 mg/dL — ABNORMAL HIGH (ref 6–23)
CO2: 23 mEq/L (ref 19–32)
Calcium: 8.6 mg/dL (ref 8.4–10.5)
Calcium: 9.9 mg/dL (ref 8.4–10.5)
Chloride: 96 mEq/L (ref 96–112)
Chloride: 91 mEq/L — ABNORMAL LOW (ref 96–112)
Creatinine, Ser: 3.09 mg/dL — ABNORMAL HIGH (ref 0.4–1.5)
Creatinine, Ser: 5.97 mg/dL — ABNORMAL HIGH (ref 0.4–1.5)
GFR calc Af Amer: 26 mL/min — ABNORMAL LOW (ref >60)
GFR calc Af Amer: 12 mL/min — ABNORMAL LOW (ref >60)
GFR calc Af Amer: 13 mL/min — ABNORMAL LOW (ref >60)
GFR calc non Af Amer: 21 mL/min — ABNORMAL LOW (ref >60)
GFR calc non Af Amer: 11 mL/min — ABNORMAL LOW (ref >60)
Glucose, Bld: 153 mg/dL — ABNORMAL HIGH (ref 70–99)
Potassium: 4.8 mEq/L (ref 3.5–5.1)
Sodium: 130 mEq/L — ABNORMAL LOW (ref 135–145)

## 2010-08-01 LAB — HEMOGLOBIN A1C: Mean Plasma Glucose: 180 mg/dL

## 2010-08-01 LAB — COMPREHENSIVE METABOLIC PANEL
ALT: 38 U/L (ref 0–53)
ALT: 56 U/L — ABNORMAL HIGH (ref 0–53)
ALT: 63 U/L — ABNORMAL HIGH (ref 0–53)
AST: 88 U/L — ABNORMAL HIGH (ref 0–37)
AST: 117 U/L — ABNORMAL HIGH (ref 0–37)
AST: 122 U/L — ABNORMAL HIGH (ref 0–37)
AST: 162 U/L — ABNORMAL HIGH (ref 0–37)
Albumin: 3.1 g/dL — ABNORMAL LOW (ref 3.5–5.2)
Albumin: 2.9 g/dL — ABNORMAL LOW (ref 3.5–5.2)
Albumin: 3 g/dL — ABNORMAL LOW (ref 3.5–5.2)
Albumin: 3.1 g/dL — ABNORMAL LOW (ref 3.5–5.2)
Alkaline Phosphatase: 539 U/L — ABNORMAL HIGH (ref 39–117)
CO2: 26 mEq/L (ref 19–32)
Calcium: 8.8 mg/dL (ref 8.4–10.5)
Calcium: 8.9 mg/dL (ref 8.4–10.5)
Calcium: 8.9 mg/dL (ref 8.4–10.5)
Chloride: 91 mEq/L — ABNORMAL LOW (ref 96–112)
Creatinine, Ser: 4.86 mg/dL — ABNORMAL HIGH (ref 0.4–1.5)
Creatinine, Ser: 5 mg/dL — ABNORMAL HIGH (ref 0.4–1.5)
GFR calc Af Amer: 16 mL/min — ABNORMAL LOW (ref >60)
GFR calc Af Amer: 15 mL/min — ABNORMAL LOW (ref >60)
GFR calc Af Amer: 15 mL/min — ABNORMAL LOW (ref >60)
GFR calc Af Amer: 17 mL/min — ABNORMAL LOW (ref >60)
Potassium: 4.6 mEq/L (ref 3.5–5.1)
Sodium: 122 mEq/L — ABNORMAL LOW (ref 135–145)
Sodium: 122 mEq/L — ABNORMAL LOW (ref 135–145)
Sodium: 127 mEq/L — ABNORMAL LOW (ref 135–145)
Total Bilirubin: 0.7 mg/dL (ref 0.3–1.2)
Total Protein: 9.1 g/dL — ABNORMAL HIGH (ref 6.0–8.3)
Total Protein: 8.4 g/dL — ABNORMAL HIGH (ref 6.0–8.3)
Total Protein: 8.5 g/dL — ABNORMAL HIGH (ref 6.0–8.3)
Total Protein: 9 g/dL — ABNORMAL HIGH (ref 6.0–8.3)

## 2010-08-01 LAB — RETICULOCYTES
RBC.: 3.28 MIL/uL — ABNORMAL LOW (ref 4.22–5.81)
Retic Ct Pct: 2.9 % (ref 0.4–3.1)

## 2010-08-01 LAB — BLOOD GAS, ARTERIAL
Acid-base deficit: 2.4 mmol/L — ABNORMAL HIGH (ref 0.0–2.0)
O2 Content: 2 L/min
pCO2 arterial: 33.4 mmHg — ABNORMAL LOW (ref 35.0–45.0)

## 2010-08-01 LAB — TSH
TSH: 79.752 u[IU]/mL — ABNORMAL HIGH (ref 0.350–4.500)
TSH: 18.471 u[IU]/mL — ABNORMAL HIGH (ref 0.350–4.500)

## 2010-08-01 LAB — CARDIAC PANEL(CRET KIN+CKTOT+MB+TROPI)
Relative Index: INVALID (ref 0.0–2.5)
Total CK: 49 U/L (ref 7–232)
Troponin I: 0.01 ng/mL (ref 0.00–0.06)
Troponin I: 0.05 ng/mL (ref 0.00–0.06)

## 2010-08-01 LAB — BRAIN NATRIURETIC PEPTIDE
Pro B Natriuretic peptide (BNP): 2491 pg/mL — ABNORMAL HIGH (ref 0.0–100.0)
Pro B Natriuretic peptide (BNP): 2989 pg/mL — ABNORMAL HIGH (ref 0.0–100.0)

## 2010-08-01 LAB — POCT I-STAT 3, ART BLOOD GAS (G3+)
Acid-base deficit: 1 mmol/L (ref 0.0–2.0)
Bicarbonate: 23.5 mEq/L (ref 20.0–24.0)
O2 Saturation: 93 %
pO2, Arterial: 66 mmHg — ABNORMAL LOW (ref 80.0–100.0)

## 2010-08-01 LAB — PHOSPHORUS
Phosphorus: 2.2 mg/dL — ABNORMAL LOW (ref 2.3–4.6)
Phosphorus: 3.3 mg/dL (ref 2.3–4.6)

## 2010-08-01 LAB — DIFFERENTIAL
Basophils Absolute: 0.1 10*3/uL (ref 0.0–0.1)
Eosinophils Absolute: 0.7 10*3/uL (ref 0.0–0.7)
Eosinophils Relative: 14 % — ABNORMAL HIGH (ref 0–5)
Lymphocytes Relative: 14 % (ref 12–46)
Lymphocytes Relative: 15 % (ref 12–46)
Lymphs Abs: 0.8 10*3/uL (ref 0.7–4.0)
Monocytes Absolute: 0.5 10*3/uL (ref 0.1–1.0)
Monocytes Relative: 7 % (ref 3–12)
Monocytes Relative: 11 % (ref 3–12)
Neutro Abs: 4.2 10*3/uL (ref 1.7–7.7)
Neutrophils Relative %: 59 % (ref 43–77)

## 2010-08-01 LAB — MAGNESIUM
Magnesium: 2.7 mg/dL — ABNORMAL HIGH (ref 1.5–2.5)
Magnesium: 2.2 mg/dL (ref 1.5–2.5)

## 2010-08-01 LAB — POCT I-STAT 4, (NA,K, GLUC, HGB,HCT)
Potassium: 2.1 mEq/L — CL (ref 3.5–5.1)
Sodium: 141 mEq/L (ref 135–145)

## 2010-08-01 LAB — POTASSIUM: Potassium: 4.6 mEq/L (ref 3.5–5.1)

## 2010-08-01 LAB — TROPONIN I: Troponin I: 0.04 ng/mL (ref 0.00–0.06)

## 2010-08-01 LAB — TECHNOLOGIST SMEAR REVIEW

## 2010-08-01 LAB — GLUCOSE, RANDOM: Glucose, Bld: 643 mg/dL (ref 70–99)

## 2010-08-02 LAB — GLUCOSE, CAPILLARY
Glucose-Capillary: 135 mg/dL — ABNORMAL HIGH (ref 70–99)
Glucose-Capillary: 162 mg/dL — ABNORMAL HIGH (ref 70–99)
Glucose-Capillary: 281 mg/dL — ABNORMAL HIGH (ref 70–99)
Glucose-Capillary: 354 mg/dL — ABNORMAL HIGH (ref 70–99)
Glucose-Capillary: 66 mg/dL — ABNORMAL LOW (ref 70–99)

## 2010-08-02 LAB — COMPREHENSIVE METABOLIC PANEL
AST: 62 U/L — ABNORMAL HIGH (ref 0–37)
Albumin: 2.7 g/dL — ABNORMAL LOW (ref 3.5–5.2)
Alkaline Phosphatase: 644 U/L — ABNORMAL HIGH (ref 39–117)
Alkaline Phosphatase: 698 U/L — ABNORMAL HIGH (ref 39–117)
BUN: 94 mg/dL — ABNORMAL HIGH (ref 6–23)
Chloride: 87 mEq/L — ABNORMAL LOW (ref 96–112)
Chloride: 89 mEq/L — ABNORMAL LOW (ref 96–112)
GFR calc Af Amer: 10 mL/min — ABNORMAL LOW (ref >60)
GFR calc Af Amer: 12 mL/min — ABNORMAL LOW (ref >60)
Glucose, Bld: 69 mg/dL — ABNORMAL LOW (ref 70–99)
Potassium: 5.5 mEq/L — ABNORMAL HIGH (ref 3.5–5.1)
Potassium: 5.7 mEq/L — ABNORMAL HIGH (ref 3.5–5.1)
Sodium: 125 mEq/L — ABNORMAL LOW (ref 135–145)
Total Bilirubin: 0.7 mg/dL (ref 0.3–1.2)
Total Bilirubin: 0.8 mg/dL (ref 0.3–1.2)
Total Protein: 8.7 g/dL — ABNORMAL HIGH (ref 6.0–8.3)

## 2010-08-02 LAB — CBC
MCHC: 32.5 g/dL (ref 30.0–36.0)
MCV: 89.7 fL (ref 78.0–100.0)
Platelets: 344 10*3/uL (ref 150–400)
RBC: 3.39 MIL/uL — ABNORMAL LOW (ref 4.22–5.81)

## 2010-08-02 LAB — GLUCOSE, RANDOM: Glucose, Bld: 89 mg/dL (ref 70–99)

## 2010-08-02 LAB — AMMONIA
Ammonia: 21 umol/L (ref 11–35)
Ammonia: 37 umol/L — ABNORMAL HIGH (ref 11–35)

## 2010-08-02 LAB — IRON AND TIBC
Iron: 50 ug/dL (ref 42–135)
TIBC: 208 ug/dL — ABNORMAL LOW (ref 215–435)

## 2010-08-02 LAB — HEPATITIS B SURFACE ANTIGEN: Hepatitis B Surface Ag: NEGATIVE

## 2010-08-30 NOTE — Consult Note (Signed)
Marcus [name], MCCOMBIE NO.:  [id number]   MEDICAL RECORD NO.:  [id number]          PATIENT TYPE:  INP   LOCATION:  5505                         FACILITY:  [facility]   PHYSICIAN:  Terrial Rhodes, M.D.DATE OF BIRTH:  10/23/1955   DATE OF CONSULTATION:  03/13/2008  DATE OF DISCHARGE:                                 CONSULTATION   REASON FOR CONSULTATION:  Chronic renal failure, diabetes, hypertension,  and abdominal pain.   HISTORY OF PRESENT ILLNESS:  [name] is a 55 year old African  American male with past medical history significant for end-stage renal  disease secondary to diabetes, on chronic dialysis Tuesday, Thursday,  and Saturday at [facility].  He had transferred to a new  nursing home in [place] this week, but was having increased  blood sugars despite increasing insulin.  He was sent to the emergency  room.  The patient became hypoglycemic and was also complaining of  abdominal pain for 1 week with occasional nausea and vomiting.  Denies  any changes in bowel movements, chest pain, shortness of breath, fevers,  or chills.  He was admitted by the Hospital Service when were asked to  see the patient to help manage his end-stage renal disease.   ALLERGIES:  He has no known drug allergies.   PAST MEDICAL HISTORY:  1. End-stage renal disease, on dialysis every Tuesday, Thursday, and      Saturday at [facility].  2. Insulin-dependent diabetes mellitus since the age of 66.  3. Hypertension.  4. Peripheral vascular disease.  5. COPD.  6. Coronary artery disease with a history of MI.  7. Depression.  8. Diverticular disease.  9. Gastroparesis.  10.Hypothyroidism.  11.History of medical noncompliance.  12.History of substance abuse.  13.History of urethral stricture.  14.Hyperlipidemia.  15.Dementia.   CURRENT MEDICATIONS:  1. Lisinopril 40 a day.  2. Synthroid 175 mcg a day.  3. Labetalol 400 mg t.i.d.  4.  Hydralazine 50 mg q.i.d.  5. Clonidine 0.2 mg t.i.d.  6. Procardia XL 60 mg a day.  7. Levemir 6 units subcu each day.  8. Calcium carbonate chew with each meal.  9. MiraLax daily.   FAMILY HISTORY:  Significant for diabetes, hypertension.  Father died of  suicide.   SOCIAL HISTORY:  Lives in a nursing home on disability, single, has a 30  pack-year tobacco history.  History of cocaine and alcohol in the past,  none now.  His mother lives in [place], which is why transferred to the  nursing home closer to her.   REVIEW OF SYSTEMS:  As per HPI.  Denies any fevers or chills.  HEENT:  No headaches, vertigo, or epistaxis.  CARDIAC:  No chest pain,  palpitations, orthopnea, or PND.  PULMONARY:  He has a cough, it is  nonproductive.  No hemoptysis.  GI:  Has had some nausea, vomiting, and  abdominal pain intermittently over the last week.  Denies any diarrhea.  No hematochezia, melena, or bright red blood per rectum.  GU:  No  dysuria or pyuria.  All other systems negative.  PHYSICAL EXAMINATION:  GENERAL:  He is a frail, chronically ill-  appearing man, in no apparent distress.  VITAL SIGNS:  Temperature 97.4, pulse 65, blood pressure 164/84,  respiratory rate 20, and pulse ox 100% on room air.  HEENT:  Head, normocephalic and atraumatic.  Extraocular muscles intact.  Sclerae were clear.  NECK:  Supple.  He has a right IJ catheter, no drainage.  LUNGS:  Scattered rhonchi with bibasilar crackles.  CARDIAC:  Regular rate and rhythm.  No precordial rub appreciated.  ABDOMEN:  Normoactive bowel sounds, distended.  No guarding or rebound.  EXTREMITIES:  No edema.   Chest x-ray showed bilateral pleural effusions with patchy opacities.  CT of abdomen and pelvis with ascites.   LABORATORIES:  White blood cell count 7.9, hemoglobin 10.6, and  platelets 379.  Sodium 134, potassium 4, chloride 99, BUN 30, creatinine  4.8, and glucose 181.   ASSESSMENT/PLAN:  1. End-stage renal disease.   We will plan for dialysis tomorrow with      ultrafiltration.  We will try to obtain outpatient records from      [facility].  2. Abdominal pain.  The patient did have a large bowel movement in his      pants that is loose stools.  We will send stool for Clostridium      difficile, but also would recommend paracentesis to rule out      spontaneous peritonitis.  3. Brittle diabetes, per Primary Service.  4. Hypertension, on multiple medications.  Blood pressure is elevated      now.  We will ultrafilter and follow.  5. Anemia.  We will check outpatient EPO and iron in the records.  6. Secondary hyperparathyroidism.  We will check outpatient records      and dose vitamin D appropriately.  7. Disposition.  We will plan to transfer the patient back to the      [facility] and follow up with [facility] when      stable for discharge.   Thank you for this consultation.           ______________________________  Terrial Rhodes, M.D.     JC/MEDQ  D:  03/13/2008  T:  03/13/2008  Job:  [id number]

## 2010-08-30 NOTE — Discharge Summary (Signed)
Marcus [name], Marcus [name]              ACCOUNT NO.:  [id number]   MEDICAL RECORD NO.:  [id number]          PATIENT TYPE:  INP   LOCATION:  5505                         FACILITY:  [facility]   PHYSICIAN:  Herbie Saxon, MDDATE OF BIRTH:  [birth year]   DATE OF ADMISSION:  03/13/2008  DATE OF DISCHARGE:                               DISCHARGE SUMMARY   DATE OF DISCHARGE:  To be determined.   MEDICATIONS:  Home medications to be dictated on final discharge.   DISCHARGE DIAGNOSES:  1. End-stage renal disease on the hemodialysis.  2. Anemia, on chronic disease.  3. Hypertension, uncontrolled, but improved.  4. Insulin-dependent diabetes, uncontrolled.  5. Hyponatremia.  6. Hyperkalemia.  7. Hypothyroidism.  8. Spontaneous bacterial peritonitis, improved on IV Rocephin.  9. History of chronic obstructive pulmonary disease.  10.Peripheral vascular disease,  protein-calorie malnutrition.  11.History of depression.  12.History of diverticular disease.  13.Medical noncompliance.  14.History of substance abuse.  15.Hyperlipidemia.  16.Dementia.  17.History of ureteral stricture.  18.Osteoporosis.  19.History of coronary artery disease.   RADIOLOGY:  He had an ultrasound-guided thoracentesis on March 16, 2008, yield 1.8 L of dark yellow fluid.  CT of the abdomen and pelvis of  March 13, 2008, showed extensive ascites, bibasilar atelectasis, tiny  effusions, questionable bibasilar infiltrates, lobular liver without  focal mass, questionable small subcapsular flocculation at the lateral  margin of the spleen, sigmoid diverticulosis and extensive pelvic  ascites, signs of mild diffuse trabecular sclerosis representing renal  osteodystrophy.   CONSULTS:  1. Terrial Rhodes, M.D, Nephrology.  2. Dr. Corrie Mckusick, Vascular Surgery.   HOSPITAL COURSE:  This is a 55 year old African American male presented  to the emergency room from the [facility] at [facility]  with increasing abdominal pain, nausea, vomiting.  At presentation, he  was noticed to have extensive ascites and diarrhea.  Blood sugar was  also uncontrolled.  He was admitted and started on IV Rocephin,  Zithromax, IV fluid hydration, insulin coverage, and basal insulin with  Lantus.  He had successful paracentesis on March 16, 2008, yield 1.8  L of yellow liquid.  Abdominal pain has been resolving.  The hepatitis  serology was sent and his hepatitis B and C are negative.  Also C. diff  toxin was sent on presentation, which also came back negative.  The  patient has prone to hypoglycemic episodes, so he has been given Juel Burrow-  Pravastatin sliding scale insulin coverage and his Lantus insulin is  being titrated with caution.  He has recurrent hypoglycemia and has  __________ on March 19, 2008.  He has been having daily Kayexalate to  ameliorate his hyperkalemia.  Rocephin has been discontinued on March 21, 2008, as the patient's abdominal pain is much improved.  He has been  seen by Dr. Vernell Leep who is scheduling him for vein mapping on March 25, 2008.  Blood pressure has been very brittle and Minoxidil, Cardizem  was added his antihypertensive regimen on March 22, 2008.   PHYSICAL EXAMINATION:  GENERAL:  On examination today, he is an elderly  middle-aged man not  in acute distress.  Clinically ill-looking,  cachectic.  VITAL SIGNS:  Temperature 98, pulse 60, respiratory rate is 18, and  blood pressure 170/80.  SKIN:  He is pale, not jaundiced.  NECK:  Supple.  CHEST:  Reduced breath sounds at the bases.  ABDOMEN:  Has minimal ascites.  Soft and nontender.  Bowel sounds  present.  NEUROLOGIC:  He is alert, he is demented.  Peripheral pulses reduced.  No pedal edema.   LABORATORY DATA:  WBC is 4.9, hematocrit 33, platelet count is 336.  Chemistry, glucose of 260, sodium 127, potassium 6.1, chloride 93,  bicarbonate 26, BUN 45, creatinine 4.9.   PLAN:  The patient is to  have Kayexalate to conservatively address  recurrent hyperkalemia.We will restrict free fluid intake  less than one  liter a day, increase his Lantus insulin to 10 units in the morning, 5  units in the evening.  Monitor renal panel in the morning.  Further  discharge instructions as hospital course progresses.  Follow Vascular  Surgery plans.  Follow Nephrology plans.      Herbie Saxon, MD  Electronically Signed     MIO/MEDQ  D:  03/23/2008  T:  03/24/2008  Job:  [id number]

## 2010-08-30 NOTE — Group Therapy Note (Signed)
NAMELEDFORD, GOODSON NO.:  [id number]   MEDICAL RECORD NO.:  [id number]          PATIENT TYPE:  INP   LOCATION:  6712                         FACILITY:  [facility]   PHYSICIAN:  Michelene Gardener, MD    DATE OF BIRTH:  [birth year]                                 PROGRESS NOTE   CURRENT DIAGNOSES:  1. Uncontrolled diabetes mellitus with no recurrent episodes of      hypoglycemia.  2. Malignant hypertension.  3. Hyponatremia.  4. Secondary hyperthyroidism.  5. End-stage renal disease.  6. History of bilateral pleural effusion.  7. History of liver cirrhosis.  8. Chronic anemia.  9. Remote history of substance abuse.  10.Failure to thrive.  11.Protein-calorie malnutrition.   DISCHARGE MEDICATIONS:  To be determined.   CONSULTATIONS:  Nephrology consult.   RADIOLOGY STUDIES:  1. Abdominal ultrasound December 24 showed no evidence of gallstones      or cholecystitis and showed ascites and bilateral pleural      effusions.  2. Abdominal x-ray on December 23 showed possible ascites.  3. MRI of the brain without contrast on December 24 showed mastoiditis      without evidence of stroke.   COURSE OF HOSPITALIZATION:  1. Diabetes mellitus with recurrent episodes of hypoglycemia.  This      patient was recently hospitalized, and he was discharged on      December 14.  At that time, he had multiple episodes of      hypoglycemia, and he was taken off his insulin.  He was brought to      the hospital with elevated glucose to more than 600.  The patient      does not have any evidence of diabetic ketoacidosis.  The patient      was admitted to the hospital for further evaluation.  He was      started on insulin drip through the protocol.  His insulin drip was      discontinued the next day, and he was switched to sliding scale.      Since that time, he had multiple episodes of hypoglycemia.      Following that, his sugars started to rise, and he was restarted on  small dose of Lantus at 5 units which was increased then to 10      units.  On that regimen, his sugar continued to go up and down, and      his insulin was adjusted to 10 units in the morning and 5 units at      night.  Today, on December 29, his sugar went very high at 600, and      he has to be treated with multiple injections of insulin.  I again      adjusted his insulin Lantus to 10 units twice daily.  We need to      follow this very carefully because of his uncontrolled sugar and      recurrent episodes of hypoglycemia.  Further adjustment will be      done according to his finger sticks.  2. Malignant hypertension.  Again,  his blood pressure has been very      hard to control.  The patient was started on 20 mg of lisinopril      which was then increased to 40 mg.  Norvasc 10 mg was added.  Last      night on December 28, his blood pressure is still on the high side.      Today, his blood pressure improved to 147/85.  Will continue him on      the same medicine and will adjust his medications depending on his      blood pressure readings.  3. Hyponatremia that was mild and does not need further workup.  4. Secondary hyperthyroidism.  This patient was diagnosed with      hypothyroidism which I think was misdiagnosis.  He had TSH and free      T4, and both of them came to be elevated.  That raised the      possibility of secondary hyperthyroidism.  I ordered more tests to      confirm the diagnosis, and his growth hormone came to be elevated.      His ACHT was elevated, and his prolactin was elevated.  Those will      definitely go with the diagnosis of secondary hyperthyroidism      secondary to central course.  MRI of the brain was done and showed      no evidence of pituitary tumor.  This patient definitely needs to      be evaluated by endocrinology, and that will be done as an      outpatient.  I took him off his Synthroid.  5. End-stage renal disease.  Management has been done  by nephrology,      and the patient was continued on his hemodialysis.   Otherwise, his other medical conditions were stable at this point.  The  most important task of management is his blood sugar and blood pressure  at this point and to follow with endocrinology regarding his thyroid  following his discharge.  An updated discharge summary will be dictated  by the discharging physician.   Total assessment time is 40 minutes.      Michelene Gardener, MD  Electronically Signed     NAE/MEDQ  D:  04/14/2008  T:  04/14/2008  Job:  [id number]

## 2010-08-30 NOTE — Consult Note (Signed)
NAMEJOCK, MAHON NO.:  [id number]   MEDICAL RECORD NO.:  [id number]          PATIENT TYPE:  INP   LOCATION:  3741                         FACILITY:  [facility]   PHYSICIAN:  Doylene Canning. Ladona Ridgel, MD    DATE OF BIRTH:  November 09, 1955   DATE OF CONSULTATION:  05/07/2008  DATE OF DISCHARGE:                                 CONSULTATION   CONSULTATION IS REQUESTED BY:  [facility] for evaluation of PVCs  in the setting of multiple cardiac risk factors.   HISTORY OF PRESENT ILLNESS:  The patient is a 55 year old male with  longstanding diabetes, hypothyroidism, hypertension, and end-stage renal  disease, who is now on hemodialysis.  He developed shortness of breath  and was taken from the nursing home where he resides to the hospital and  he was thought to have pneumonia.  His blood sugars were quite elevated.  He had an elevated BNP.  His hospital course has otherwise been  remarkable and that his dyspnea is improved.  However, he has had  frequent episodes of PVCs, and a 2-D echo was obtained demonstrating  preserved LV systolic function.  He is referred for additional  evaluation.  The patient denies chest pain.  He does have dyspnea on  exertion.  Others symptoms are as noted.  He denies syncope.  He does  not have palpitations.   PAST MEDICAL HISTORY:  Notable for:  1. Hypertension.  2. Diabetes.  3. Hypothyroidism.  4. End-stage renal disease, on hemodialysis.   FAMILY HISTORY:  Noncontributory.   SOCIAL HISTORY:  The patient lives in a nursing facility.  He has a  history of tobacco and alcohol use, but denies all of those presently.   SURGICAL HISTORY:  Notable for multiple AV fistula and graft placements.   MEDICATIONS:  1. Norvasc 10 a day.  2. Aranesp 200 mcg with dialysis.  3. Prozac 10 a day.  4. Lantus insulin.  5. Synthroid 200 mcg daily.  6. Lisinopril 40 twice a day.  7. Metoprolol 50 q.6 h.  8. Protonix 40 a day.  9. MiraLax.  10.Nephro-Vite.   REVIEW OF SYSTEMS:  As noted in the HPI.   PHYSICAL EXAMINATION:  GENERAL:  He is a pleasant chronically ill-  appearing middle-aged man, in no acute distress.  VITAL SIGNS:  His blood pressure today is 114/55, pulse was 72 and  irregular, respirations were 18, and temperature is 98.  HEENT:  Normocephalic and atraumatic.  Pupils equal and round.  Oropharynx is moist.  Sclerae anicteric.  NECK:  7- to 8-cm jugular distention.  There is occasional cannon A-  wave.  There is no obvious thyromegaly.  Trachea is midline.  LUNGS:  Minimal rales in the bases.  No wheezes or rhonchi are noted.  CARDIOVASCULAR:  Irregular rate and rhythm with normal S1 and S2.  There  is a soft S4 gallop present.  PMI was not enlarged nor was it laterally  displaced.  ABDOMEN:  Soft and nontender, but somewhat scaphoid.  EXTREMITIES:  No cyanosis, clubbing, or edema.  Pulses were 2+ and  symmetric.  NEUROLOGIC:  Alert and oriented x3.  His cranial nerves are intact.  Strength is 5/5 and symmetric.   EKG demonstrates sinus rhythm with PVCs with a right bundle-branch block  QRS morphology.   IMPRESSION:  1. Frequent, but asymptomatic premature ventricular contractions.  2. End-stage renal disease, on hemodialysis.  3. Pneumonia, on antibiotics.  4. Hypertension.  5. Diabetes, not well controlled.   DISCUSSION:  The PVCs are not symptomatic to this patient.  He has  multiple cardiac risk factors.  With the PVCs and multiple cardiac risk  factors, I would recommend adenosine Myoview to rule out occult  ischemia.  He is on beta-blocker and I would continue this as his BNP is  quite elevated.  It is unclear what his clinical significance of this is  in the setting of end-stage renal disease and dialysis, but I wonder if  he is not being adequately dialyzed in the terms of removal volume.      Doylene Canning. Ladona Ridgel, MD  Electronically Signed     GWT/MEDQ  D:  05/07/2008  T:  05/08/2008   Job:  [id number]   cc:   Barry Dienes. Eloise Harman, M.D.

## 2010-08-30 NOTE — Discharge Summary (Signed)
NAMERAHMON, HEIGL NO.:  [id number]   MEDICAL RECORD NO.:  [id number]          PATIENT TYPE:  INP   LOCATION:  6709                         FACILITY:  [facility]   PHYSICIAN:  Beckey Rutter, MD  DATE OF BIRTH:  11-Apr-1956   DATE OF ADMISSION:  05/02/2008  DATE OF DISCHARGE:                               DISCHARGE SUMMARY   1. For the last week, the patient was continued with hemodialysis as      scheduled.  Nephrology Service was kindly following through with      [place].  2. Diabetes.  The patient has brittle diabetes, has episodes of      hypoglycemia last week and the meal coverage was stopped.  As of      today, going to start the patient with meal coverage, but with only      3 units of NovoLog t.i.d. before meals.  The patient has very      brittle diabetes and he had episodes of hyperglycemia and      hypoglycemia with minimal amount of Lantus.  That is why the      patient might benefit from long-term acute care facility to better      control his diabetes rather than a skilled nursing facility.  3. Multiple PVCs.  The patient was seen by Dr. Ladona Ridgel for PVCs in the      setting of multiple cardiac risk factors.  The patient has      undergone a stress test which essentially negative.  The impression      is,      a.     Reading negative for pharmacologic stress-induced ischemia.      b.     Left ventricular ejection fraction is 59%.   RADIOLOGY:  During this hospital stay, the patient had x-ray on the day  of admission, May 02, 2008.  The impression is,  1. Newly patchy airspace disease, right greater than left.  Possibly      asymmetrical edema, but difficult to as pneumonia such as      aspiration.  2. Bilateral pleural effusions.   On May 02, 2008, the patient had CT abdomen without contrast.  The  impression is,  1. Large amount of abdominal ascites.  This is similar compared to      November 2009.  2. Mildly prominent globular appearance  of the liver, unchanged,      question cirrhosis.  3. ? Distal esophageal wall thickening which can be seen in the      setting of esophagitis or neoplasm.  4. Finding in the lung bases are compatible with CHF.  5. Some of the small bowel wall might be mildly thickened, question if      this could be related to hypoproteinemia given the ascites.      Findings are nonspecific.   The patient had CT pelvis on the same day.  The impression is,  1. Large volume of pelvic ascites.  2. Diverticulosis.   Chest x-ray on May 04, 2008.  The impression,  1. Interval resolution of bilateral  edema or infiltrate.  2. Persistent pleural effusions.  3. Mild cardiomegaly.   On May 09, 2008, the patient had a stress test with [facility]  [facility].  The impression is,  1. Negative for pharmacologic stress-induced ischemia.  2. Left ventricular ejection fraction is 59.   PROBLEMS DURING THE HOSPITAL STAY:  1. End-stage renal disease with fluid overload.  The patient has      bilateral pleural effusion and fluid overload.  The patient      responded to hemodialysis well and continued on hemodialysis as      scheduled without problem.  2. Premature ventricular contractions with multiple risk factors.  The      patient's workup is negative for reversible ischemia.  No further      cardiology workup warranted at this time.  3. Diabetes.  The patient has brittle diabetes.  He will be started on      a small dose of insulin for meal coverage.  We will continue to      manage his diabetes carefully.  As stated, he has very brittle      diabetes.  What make things worse is the fact that he is not eating      his meal portion consistently, sometimes he does and sometimes he      does not, which would make meal coverage and diabetes management      difficult.  4. Hypothyroidism, stable.  5. Right arm pain.  The patient had right arteriovenous shunt/graft a      few months back, and since then, the  patient has some pain and      swelling in the area.  Nevertheless, there is no evidence of      infection and the patient's pain usually responds to painkillers.      His capillary flush remained stable.  I would recommend further      assessment to his capillary flush in the right arm on a daily      basis.   DISCHARGE DIAGNOSES:  1. Fluid overload/congestive heart failure, resolved by hemodialysis.  2. Multiple premature ventricular contractions with no evidence of      ischemia on pharmacological stress test.  3. End-stage renal disease, on hemodialysis Tuesday, Thursday, and      Saturdays.  4. Hypertension.  5. Depression.  6. Diabetes brittle, difficult to control.  7. Hypothyroidism.  8. Constipation.   DISCHARGE MEDICATION:  Discharge medication will be dictated on the day  of actual discharge.      Beckey Rutter, MD  Electronically Signed     EME/MEDQ  D:  05/12/2008  T:  05/13/2008  Job:  [id number]

## 2010-08-30 NOTE — H&P (Signed)
NAMEADD, DINAPOLI NO.:  [id number]   MEDICAL RECORD NO.:  [id number]          PATIENT TYPE:  INP   LOCATION:  1847                         FACILITY:  [facility]   PHYSICIAN:  Eduard Clos, MDDATE OF BIRTH:  26-Sep-1955   DATE OF ADMISSION:  05/02/2008  DATE OF DISCHARGE:                              HISTORY & PHYSICAL   PRIMARY CARE PHYSICIAN:  Barry Dienes. Eloise Harman, M.D., at [facility].   NEPHROLOGIST:  [facility].   CHIEF COMPLAINT:  Hypoxia and high blood pressure.   HISTORY OF PRESENT ILLNESS:  A 55 year old male with known history of  diabetes mellitus type 2 which is very brittle, hypothyroidism,  hypertension. He was started on hemodialysis, was discharged yesterday,  was brought back today from the nursing home after he was found to be  hypoxic, shortness of breath and having high blood pressure. In the ER,  the patient had a chest x-ray which showed fluid overload with possible  pneumonia. The patient has been admitted for further management and  evaluation. The patient also was found to have blood sugars around 600.  The patient has a known history of very brittle diabetes. The patient at  this time only complains of mild shortness of breath and some abdominal  pain which is diffuse. Denies any nausea, vomiting, diarrhea, chest  pain, fevers, chills, headache, loss of consciousness. The patient  stated he has been short of breath since last evening and denies any  productive cough.   PAST MEDICAL HISTORY:  1. Hypertension.  2. Diabetes mellitus type 2.  3. Hypothyroidism.  4. ESRD on hemodialysis.   PAST SURGICAL HISTORY:  AV fistula placement, AV graft placement.   MEDICATIONS PRIOR TO ADMISSION:  1. Norvasc 10 mg p.o. daily.  2. Aranesp 200 mcg IV with hemodialysis.  3. Hectorol 0.5 mg IV during dialysis.  4. Prozac 10 mg daily.  5. Lantus insulin 5 units subcutaneous q.12 h.  6. Synthroid 200 mcg p.o.  daily.  7. Lisinopril 40 mg p.o. b.i.d.  8. Metoprolol 50 mg p.o. q.6 h.  9. Nepro 237 mL p.o. t.i.d.  10.Protonix 40 mg p.o. daily.  11.MiraLax 17 g p.o. daily.  12.Nephro-Vite 1 tablet daily.  13.Senokot.   ALLERGIES:  PENICILLIN.   SOCIAL HISTORY:  The patient lives in a home. Has previous history of  cigarettes smoking, drinking alcohol and substance abuse.   FAMILY HISTORY:  Nothing contributory.   REVIEW OF SYSTEMS:  As in the history of present illness. Nothing else  significant.   PHYSICAL EXAMINATION:  The patient examined at bedside. Not in acute  distress but appears mildly short of breath.  VITAL SIGNS:  Blood pressure is 208/87, pulse 64 per minute, temperature  98.6, respirations 18 per minute, O2 saturation 98%.  HEENT:  Anicteric. No pallor.  CHEST:  Bilateral air entry present. Mild basilar crepitation on both  sides. No rhonchi.  HEART:  S1-S2 heard.  ABDOMEN:  Soft. Mild tenderness in the left lower quadrant, though  patient has pain all over the abdomen. Bowel sounds heard. No guarding.  No  rigidity.  CENTRAL NERVOUS SYSTEM:  Alert, awake, oriented to time, place and  person. Moves upper and lower extremities.  EXTREMITIES:  Peripheral pulses felt. No edema.   LABORATORY DATA:  Acute abdominal series shows near patchy airspace  disease, the right greater than left; possible asymmetric edema,  possible pneumonia - aspiration, bilateral pleural effusion. ABG:  PH is  7.4, pCO2 37, pO2 66, bicarbonate 33.5, oxygen saturation 98%. CBC:  WBC  is 5.2, hemoglobin 8.9, hematocrit 26.8, platelets 272. Blood sugar  presently is 416. Complete metabolic panel:  Sodium 122, potassium 5,  chloride 87, carbon dioxide 22, glucose 628, BUN 91, creatinine 5. AST  117, ALT 56, albumin 3.1, calcium 8.9. BNP 2902. Anion gap 13.   ASSESSMENT:  1. Shortness of breath, probably from fluid overload.  2. Possible pneumonia.  3. Abdominal pain with history of spontaneous  bacterial peritonitis.  4. Uncontrolled diabetes mellitus type 2.  5. Metabolic acidosis, mild.  6. Accelerated hypertension.  7. Hyponatremia, probably from fluid overload and hyperglycemia.  8. Hypothyroidism.  9. ESRD on hemodialysis.   PLAN:  Will admit patient to step down. Will start patient on empiric  antibiotics. Get blood cultures and urine cultures. The patient has  already received vancomycin and tobramycin. Will place patient on  glucose stabilizers. Will place on a clear liquid diet. Get a CT  urogram. Will cover patient with vancomycin and cephalosporins.  Nephrology has already been consulted. The patient's blood pressure and  shortness of breath are likely to improve after his dialysis. Will place  patient on p.r.n. IV hydralazine for his blood pressure and further  recommendations as his condition evolves.      Eduard Clos, MD  Electronically Signed     ANK/MEDQ  D:  05/02/2008  T:  05/02/2008  Job:  [id number]

## 2010-08-30 NOTE — H&P (Signed)
NAMEDENY, CHEVEZ NO.:  [id number]   MEDICAL RECORD NO.:  [id number]          PATIENT TYPE:  INP   LOCATION:  6712                         FACILITY:  [facility]   PHYSICIAN:  Vania Rea, M.D. DATE OF BIRTH:  [birth year]   DATE OF ADMISSION:  04/08/2008  DATE OF DISCHARGE:                              HISTORY & PHYSICAL   PRIMARY CARE PHYSICIAN:  Barry Dienes. Eloise Harman, M.D. at [facility]   CHIEF COMPLAINT:  Uncontrolled blood sugar and persistent nausea and  vomiting for about a week.   HISTORY OF PRESENT ILLNESS:  This 55 year old African American gentleman  with a history of end-stage renal disease, diabetes type 2, and failure  to thrive, who was recently discharged from our facility after treatment  for sepsis, questionable pneumonia, spontaneous bacterial peritonitis,  uncontrolled diabetes, and uncontrolled hypertension.  The patient had  an extensive hospital stay, and prior to discharge, he had placement of  right forearm arterial venous graft.  When readmitted to his nursing  facility on December 14th, the patient was apparently in good condition  but now patient reports that he has been vomiting everything he tries to  eat for at least a week, unable to keep anything down.  Denies vomiting  up any blood.  Denies diarrhea.  Has persistent abdominal pain.   At the time of discharge from the hospital on December 14th, patient had  been having hypoglycemic episodes, and his Lantus has been temporarily  discontinued.  Review of the nursing-home record indicates that patient  has been on a sliding scale and has been getting NovoLog episodically,  presumably because his blood sugars have been controlled; however, since  today, patient has been apparently running high.   On arrival in the emergency room, the patient's blood sugars have been  in the 600s and have reportedly not been responding to Glucomander.   Patient denies fever, cough, or  cold.  He denies chest pain.  He denies  any diarrhea.  Patient says he is still able to ambulate with the  assistance of a walker.   Patient was discharged home on a 10-day course of doxycycline, and about  4 days ago full-dose Levaquin was added, for reasons unclear.   PAST MEDICAL HISTORY:  1. End-stage renal disease, dialysis dependent.  2. Diabetes type 2.  3. History of bilateral pleural effusions.  4. History of liver cirrhosis.  5. Hypothyroidism.  6. Chronic anemia.  7. History of spontaneous bacterial peritonitis.  8. Remote history of substance abuse.  9. Remote history of tobacco abuse.  10.Failure to thrive.  11.Protein calorie malnutrition.  12.Remote left arm AV graft placement.  13.Remote right arm AV graft placement.  14.Right forearm AV graft placement, March 26, 2008.   MEDICATIONS:  1. Levaquin 500 mg daily.  2. Calcium carbonate 500 mg 3 times daily.  3. Clonidine 0.1 mg 3 times daily.  4. Synthroid 175 mcg daily.  5. MiraLax 17 gm daily.  6. Senokot-S 1 tablet at bedtime.  7. Omeprazole 20 mg daily.  8. Oxycodone 10 mg every 4 hours p.r.n.  9. Doxycycline 100 mg twice daily.  10.Fluoxetine 10 mg daily.   ALLERGIES:  PENICILLIN.   SOCIAL HISTORY:  He is a nursing-home resident.  A past history of  tobacco and polysubstance abuse.   FAMILY HISTORY:  Significant for father who died of suicide when the  patient was in childhood and a sister who died of cancer.  There is also  a family history of diabetes.   REVIEW OF SYSTEMS:  Other than noted above, a 10-point review of systems  is unable to yield much information, as the patient is quite distressed  with retching.   PHYSICAL EXAMINATION:  An ill-looking middle-aged African American  gentleman sitting up in the stretcher, retching frequently.  VITALS:  Temperature 97.8, pulse 74, respirations 20, blood pressure  104/95.  He is saturating at 97% on 2 liters.  Pupils are round and equal.  Mucous  membranes are pink and anicteric.  No cervical lymphadenopathy.  He does have enlargement of both lobes of  the thyroid and distended external jugular veins.  There is no carotid  bruit.  CHEST:  He has coarse bibasilar crackles.  CARDIOVASCULAR:  Regular rhythm.  ABDOMEN:  Mildly distended.  He is exquisitely tender in the epigastrium  in the right upper quadrant, but other parts of his abdomen are much  softer than on previous admissions.  EXTREMITIES:  His lower extremities are wasted and thin.  The ankles are  deformed.  CENTRAL NERVOUS SYSTEM:  Cranial nerves II-XII are grossly intact.  He  has no lateralizing signs.   LABS:  CBC is unremarkable with a white count of 4.8, hemoglobin 12.8,  MCV 87, platelets 237.  His monocyte count is slightly elevated.  His  sodium is 125, potassium 5, chloride 95, BUN 48, creatinine 6, glucose  617.  His ABG:  The pH is 7.41, pCO2 36, pO2 111, saturating at 98%.  This is a venous sample.  Ammonia level is 35.  A lipase is 12.   A 2-view chest x-ray shows stable cardiomegaly and small bilateral  pleural effusions.  No frank pulmonary edema.   ASSESSMENT:  1. Diabetes type 2, uncontrolled.  2. Acute gastritis of unclear etiology.  3. Abdominal pain, rule out acute cholecystitis.  4. End-stage renal disease, dialysis dependent.   PLAN:  We will admit this gentleman to a medical floor.  We will  continue intravenous glucose titrated with glucose stabilizing.  Will  get an abdominal ultrasound to rule out acute cholecystitis.  Will  discontinue doxycycline since this is a common part of GI upset.  Will  also discontinue Levaquin for the time being, as the dose needs to be  renally adjusted, in any case.  Will cover with proton pump inhibitors.  If no obvious cause of his acute abdominal problems are discovered, will  consider gastrointestinal consult for assistance with management.  Other  plans as per orders.      Vania Rea, M.D.   Electronically Signed     LC/MEDQ  D:  04/08/2008  T:  04/08/2008  Job:  [id number]   cc:   Barry Dienes. Eloise Harman, M.D.  [facility]

## 2010-08-30 NOTE — Discharge Summary (Signed)
NAMEAB, LEAMING NO.:  [id number]   MEDICAL RECORD NO.:  [id number]          PATIENT TYPE:  INP   LOCATION:  5505                         FACILITY:  [facility]   PHYSICIAN:  Beckey Rutter, MD  DATE OF BIRTH:  1955-06-16   DATE OF ADMISSION:  03/12/2008  DATE OF DISCHARGE:  03/30/2008                               DISCHARGE SUMMARY   PRIMARY CARE PHYSICIAN:  Unassigned to [facility].   Please refer to the previously dictated H and P on the day of admission  as well as previously dictated discharge summary on March 23, 2008, by  Dr. Christella Noa.   The following issues transpired while I am the attending to this patient  this week:  1. The patient continued on hemodialysis.  2. The patient had right forearm arteriovenous graft procedure done on      March 26, 2008.  The patient had some swelling in the right arm      afterwards and for suspicion of steal syndrome, the patient was      kept for monitoring.  The patient is cleared now by vascular      surgery at least with frequent evaluation and with recommendation      to elevate the right arm above the level of the heart level.  3. The patient had elevated potassium level that he continued to have      at dialysis.  He had dialysis on Friday, and, apparently, he is in      dialysis.  If the patient remains stable, he will be discharged to      skilled nursing facility.   DISCHARGE DIAGNOSES:  Please refer to the previously dictated discharge  summary for discharge diagnoses.  The patient is currently status post  right forearm arteriovenous graft done by Dr. Fabienne Bruns.   DISCHARGE MEDICATIONS:  1. Calcium carbonate 500 mg p.o. t.i.d. with meals.  2. Catapres/clonidine 0.1 mg p.o. t.i.d.  3. Aranesp 200 mcg IV Friday with hemodialysis.  4. Hectorol 0.5 mcg IV Monday/Wednesday/Friday.  5. Insulin sliding scale Vision t.i.d. before meals.  6. Synthroid 175 mcg p.o. daily.  7. Protonix 40 mg p.o.  b.i.d.  8. MiraLax 17 grams p.o. daily.  Hold for loose stool.  9. Senokot S 1-2 tabs p.o. at bedtime.  Hold for loose stool.  10.Kayexalate 30 grams p.o. p.r.n.  This should be administered after      checking the potassium level only.  11.Tylenol 650 mg p.o. q.4 h. p.r.n.  12.Dulcolax 20 mg PR q.48 h.  13.Morphine sulfate 1-2 mg IV q.3 h. p.r.n. for severe pain on the      right arm.  14.Oxycodone 5 mg 2 tabs q.4 h. p.r.n.   Please note the patient was taking antihypertensive medication which has  been on hold since yesterday.  Those medications should be reinstated  once the blood pressure has stabilized.  The patient is continued on  clonidine for hypertension, but he should be continued on the following:  1. Cardizem 240 mg p.o. b.i.d.  2. Hydralazine 5 mg IV q.6 h. p.r.n.  3.  Lopressor 2.5 mg IV q.6 h.   Please also note the patient was taking insulin sliding scale which was  moved to sensitive sliding scale.  He was taking Lantus which was  discontinued because of hypoglycemic event.  Reconsideration of long-  acting insulin like Lantus should be considered in the facility.   The patient is stable for discharge.      Beckey Rutter, MD  Electronically Signed     EME/MEDQ  D:  03/30/2008  T:  03/30/2008  Job:  [id number]

## 2010-08-30 NOTE — Discharge Summary (Signed)
NAMEDAEGAN, ARIZMENDI NO.:  [id number]   MEDICAL RECORD NO.:  [id number]          PATIENT TYPE:  INP   LOCATION:  5511                         FACILITY:  [facility]   PHYSICIAN:  Isidor Holts, M.D.  DATE OF BIRTH:  Nov 07, 1955   DATE OF ADMISSION:  04/08/2008  DATE OF DISCHARGE:                               DISCHARGE SUMMARY   PRIMARY CARE PHYSICIAN:  Dr. Barry Dienes. Jarold Motto   DISCHARGE DIAGNOSES:  For discharge diagnoses, refer to interim summary  dictated April 14, 2008 by Dr. Jannette Spanner.  In addition:  1. Right upper extremity cellulitis.  2. Right upper extremity edema, secondary to stenosed right innominate      vein.  3. Fluid overload.   CLINICAL COURSE:  For details of admission history, refer to H&P notes  of 04/08/2008, dictated by Dr. Vania Rea.  For details of the  rest of the patient's clinical course, refer to interim summary dated  April 14, 2008, as well as for details of procedures and  consultations.  However, from April 15, 2008, to April 26, 2008,  i.e. the date of this dictation, the following are pertinent:  The  patient was plagued by recurrent episodes of hypoglycemia, necessitating  frequent adjustments of insulin regimen and sometimes having to withhold  insulin altogether. This was mainly against a background of failure to  thrive and poor oral intake.  However, over the course of his  hospitalization, the patient's oral intake has gradually picked up.  We  have therefore been able to achieve an acceptable insulin regimen with  scheduled Lantus insulin on a twice-daily basis, as well as sliding-  scale insulin coverage.  As of April 26 2008, CBGs were reasonable at  between 83 and 210, and over the past 72 hours, no hypoglycemic episodes  were recorded.  The patient did have persistent right upper extremity  edema secondary to stenosis of right innominate vein, associated with  his Diatek catheter.  Diatek  catheter was removed on April 19, 2008, by  Dr. Darrick Penna, vascular surgeon and then the patient underwent a right  upper extremity dialysis graft shuntogram with central venous  angioplasty of right innominate vein on April 22, 2008.  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  It is felt that given severity of  stenosis, it is possible that a shunt may be required in the future.  The patient continues to have obvious right upper extremity edema.  At  time of this dictation, it was uncertain whether angioplasty had been  satisfactory.  The patient's right upper extremity edema was complicated  by cellulitis of that extremity, necessitating management with  intravenous Vancomycin with hemodialysis.  On April 24, 2008, the  patient had completed a 1-week course of Vancomycin, which was then  discontinued.  Local inflammatory phenomena, i.e., redness and  tenderness appear to have resolved.  His hepatic cirrhosis is clinically  compensated.  Although he has obvious ascites, he has not had abdominal  pain or respiratory distress, and is on beta-blocker treatment.  He was  evaluated by nutritionist during the course of this hospitalization.  Appropriate recommendations re his  severe protein calorie malnutrition  were made.  On April 24, 2008, the patient underwent chest x-ray which  demonstrated cardiomegaly with interval development of edema and  increasing pleural effusions, as well as patchy bilateral air space  disease.  He underwent hemodialysis with removal of the appropriate  amount of fluid, and clinical improvement.  He was once again seen on  April 22, 2008 by the nephrologist and more fluid removal, as well as  fluid restriction has been recommended.  Hemoglobin has remained  stable/reasonable and was 8.9 on April 25, 2008.  The patient  is  currently on Aranesp for this anemia of chronic disease. TSH done during  this hospitalization, was profoundly elevated at 79.752. Patient has  been placed on 200 mcg of Synthroid, but follow up and further  adjustment of thyroxine replacement therapy, will be needed.   DISPOSITION:  The patient was as of April 26, 2008 obviously nearing  discharge, and provided no further acute problems arise, it is  anticipated that he will be discharged on April 27, 2008 or April 28, 2008.   DISCHARGE MEDICATIONS:  1. Senokot-S one p.o. at bedtime.  2. MiraLax 17 grams p.o. daily.  3. Prozac 10 mg p.o. daily.  4. Aranesp 200 mcg IV on Fridays with hemodialysis.  5. Nephro-Vite one p.o. at bedtime.  6. Protonix 40 mg p.o. daily.  7. Norvasc 10 mg p.o. daily.  8. Lisinopril 40 mg p.o. b.i.d..  9. Lopressor 50 mg p.o. q.6h.  10.Synthroid 200 mcg p.o. daily.  11.Hectorol 0.5 mcg with hemodialysis on Mondays, Wednesdays and      Fridays.  12.Nepro w/carb vanilla liquid nutritional supplement 235 mL p.o.      t.i.d..  13.Lantus 5 units subcutaneously q.12h.  14.Sliding scale insulin coverage with NovoLog as follows:  CBG 70-100      no units, CBG 101-150 no units, CBG 151-200 3 units, CBG 201-255      units, CBG 251-300 7 units, CBG 301-358 9 units, CBG 351-400 11      units.   Note:  It is not recommended in this particular patient, to utilize  sliding scale insulin coverage at CBG below 200, for fear of profound  hypoglycemia, as in the past.   DIET:  Renal 60-2-2.   ACTIVITY:  As tolerated, otherwise per PT /OT.   FOLLOW-UP INSTRUCTIONS:  The patient is to continue follow-up with his  regular MD at the nursing facility.  He is to continue with hemodialysis  per pre-admission schedule, follow up routinely with his primary  nephrologist, Dr. Annie Sable, and follow up with Dr fields,  vascular surgeon.   Note:  The patient did have profound hypothyroidism with the TSH  of  79.752.  His Synthroid dosage has therefore been increased to 200 mcg  p.o. daily.  However, it is recommended that his primary MD recheck TSH  in about four weeks' time, and adjust his medications as indicated.      Isidor Holts, M.D.  Electronically Signed     CO/MEDQ  D:  04/26/2008  T:  04/26/2008  Job:  [id number]   cc:   Ivery Quale, MD

## 2010-08-30 NOTE — Discharge Summary (Signed)
Marcus [name], Marcus [name] NO.:  [id number]   MEDICAL RECORD NO.:  [id number]          PATIENT TYPE:  INP   LOCATION:  6709                         FACILITY:  [facility]   PHYSICIAN:  Theodosia Paling, MD    DATE OF BIRTH:  1956/01/12   DATE OF ADMISSION:  05/02/2008  DATE OF DISCHARGE:  05/21/2008                               DISCHARGE SUMMARY   ADDENDUM:  Please refer to the excellent discharge summary dictated by  Dr. Tamsen Roers for hospital course, discharge diagnosis, imaging and  procedure performed.   DISCHARGE DIAGNOSES:  1. Brittle diabetes.  2. Multiple cerebrovascular accidents.  3. End-stage renal disease with fluid overload.  4. Premature ventricular contraction with multiple risk factor.  5. Hypothyroidism.  6. Right arm pain.  The patient had right AV shunt or graft few months      back and intermittently gets pain and swelling in the area without      any evidence of infection and intact capillary flush.   DISCHARGE MEDICATIONS:  1. Amlodipine 10 mg p.o. nightly.  2. Hectorol 0.5 mcg IV Monday, Wednesday and Friday.  3. Prozac 10 mg p.o. daily.  4. Lopressor 50 mg p.o. q.12 hours.  5. Renal formula vitamin 1 tablet p.o. nightly.  6. Aranesp 200 mcg IV q. Wednesday.  7. Protonix 40 mg p.o. q.12 hours.  8. Sliding scale insulin for 1 unit for 101-150 glucose, from 151-200,      2 units, 201-250, 3 units, 251-300, 5 units, 301-350, 7 units, 351-      400, 9 units.  9. Insulin 2-5 units subcu at bedtime based on 72-100, 0 units, 201-      250, 2 units, 251-300, 3 units, 301-350, 4 units, 351-400, 5 units.      More than 400 call M.D.  10.Synthroid 200 mcg p.o. daily.  11.Meal coverage with NovoLog insulin 4 units subcu 8 hours, meal      coverage, given with correctional sliding scale insulin.  12.Lantus insulin 10 units subcu q.8 p.m.  13.Lantus 13 units subcu q.8 a.m.  14.Nutritional supplement liquid 237 mL p.o. q.8 hours.  15.Tylenol 325-650 p.o. q.6  hours p.r.n.  16.Restoril or temazepam 15 mg p.o. nightly p.r.n.  17.Sorbitol 30-60 mL 70% solution p.o. nightly p.r.n.  18.Docusate 1 tablet p.o. daily p.r.n.  19.Aluminum hydroxide 15-30 mL p.o. q.8 hours p.r.n.  20.Phenergan 12.5 mg p.o. q.6 hours p.r.n. nausea.  21.Hydroxyzine 25 mg p.o. q.8 hours p.r.n. itching.  22.Quinine sulfate 224 mg p.r.n. during hemodialysis for cramps.  23.Phenergan suppository 25 mg b.i.d. p.r.n.  24.Oxycodone 5 mg p.o. q.4 hours p.r.n.   Total time spent in discharge of this patient, around 45 minutes.      Theodosia Paling, MD  Electronically Signed     NP/MEDQ  D:  05/21/2008  T:  05/21/2008  Job:  [id number]   cc:   Barry Dienes. Eloise Harman, M.D.

## 2010-08-30 NOTE — Discharge Summary (Signed)
[name], [name] NO.:  [id number]   MEDICAL RECORD NO.:  [id number]          PATIENT TYPE:  INP   LOCATION:  5511                         FACILITY:  [facility]   PHYSICIAN:  Elliot Cousin, M.D.    DATE OF BIRTH:  10-26-1955   DATE OF ADMISSION:  04/08/2008  DATE OF DISCHARGE:  05/01/2008                               DISCHARGE SUMMARY   ADDENDUM:  This is an addendum discharge summary on Marcus [name].   Please see the previous discharge summary dictated by Dr. Isidor Holts.  The patient's discharge was delayed because of a lack of  availability of skilled nursing facility beds.  He has been accepted at  [facility] skilled nursing facility today.  He is in improved and stable  condition.   HOSPITAL COURSE AND DISCHARGE DIAGNOSES:  1. Very brittle diabetes mellitus.  There continued to be wide swings      in the patient's capillary blood glucose.  He was treated      accordingly with sliding scale NovoLog and Lantus as well as D50      when his capillary blood glucose fell into the 20s to 30s.  It is      probably safer to err on the side of hyperglycemia rather than      profound hypoglycemia.  It is strongly recommended that the sliding      scale coverage not be utilized in this patient if his capillary      blood glucose falls below 200 for fear of recurrent profound      hypoglycemia.  2. End-stage renal disease.  The patient continued to receive      hemodialysis during the hospitalization under the care of the      [facility]' nephrologists.  He was dialyzed      yesterday.  He will need to be dialyzed tomorrow to follow his      usual schedule of Tuesday, Thursday, Saturday dialysis.  3. Hyperthyroidism and hypothyroidism.  The patient apparently has had      iatrogenic hyperthyroidism in the past.  He has a chronic history      of hypothyroidism.  He is currently receiving 200 mcg of Synthroid      daily.  His latest TSH on April 22, 2008, was 79.752.  The patient      will need to have a followup TSH and free T4 in approximately 3-4      weeks for adjustments in the dosing of Synthroid as needed.   DISCHARGE MEDICATIONS:  1. Hemodialysis Tuesday, Thursday and Saturday.  2. Norvasc 10 mg daily.  3. Aranesp 200 mcg IV with hemodialysis every Thursday.  4. Hectorol 0.5 mcg IV on Tuesday, Thursday and Saturday at      hemodialysis.  5. Prozac 10 mg daily.  6. Sliding scale NovoLog as follows, for a CBG 70-100 zero units of      insulin, for a CBG 101-150 no units of insulin, for a CBG 151-200      zero to one unit of NovoLog, for a CBG 201-255 three units  of      NovoLog, for a CBG 251-300 five to seven units of NovoLog, for a      CBG 301-358 nine units of NovoLog, for a CBG 351-400 eleven units      of NovoLog.  7. Lantus insulin 5 units subcu q.12 hours.  8. Synthroid 200 mcg daily.  9. Lisinopril 40 mg b.i.d.  10.Metoprolol 50 mg every 6 hours.  11.Nepro 237 mL t.i.d.  12.Protonix 40 mg daily.  13.MiraLax laxative 17 grams added to 8 ounces of beverage daily.  14.Nephro-Vite 1 tablet q.h.s.  15.Senokot-S 1 tablet q.h.s.  16.Prozac 10 mg daily.      Elliot Cousin, M.D.  Electronically Signed     DF/MEDQ  D:  05/01/2008  T:  05/01/2008  Job:  [id number]

## 2010-08-30 NOTE — H&P (Signed)
Marcus [name], Marcus [name] NO.:  [id number]   MEDICAL RECORD NO.:  [id number]          PATIENT TYPE:  EMS   LOCATION:  MAJO                         FACILITY:  [facility]   PHYSICIAN:  Vania Rea, M.D. DATE OF BIRTH:  [birth year]   DATE OF ADMISSION:  03/12/2008  DATE OF DISCHARGE:                              HISTORY & PHYSICAL   PRIMARY CARE PHYSICIAN:  Unassigned.   CHIEF COMPLAINT:  Abdominal pain and uncontrollable blood sugars.   HISTORY OF PRESENT ILLNESS:  This is a 55 year old African American  gentleman with end stage renal disease, dialysis-dependent and multiple  medical problems including diabetes, who is a new resident at [facility] since November 25th, two days ago.  Prior to that was  apparently at a facility called [facility].  In any event, the  patient was sent to the emergency room with a report that his blood  sugars had been uncontrollable all day and he was registering only hi  on the glucometer.  Nursing home records indicate that the patient  usually takes Levemir 6 units each morning along with sliding scale  NovoLog before meals,  but when his blood sugar registered high, he  received NovoLog 15 units and subsequently another 12 units for a total  dose of 25 units.  The patient was transported to our emergency room and  blood sugar on initial labs recorded as 181, but patient remained in the  emergency room and after midnight his blood sugar was checked again and  was found to be 21 and even with treatment, blood sugar rose only to 56  before rising up to 154.  Additionally in the emergency room the patient  complained of severe abdominal pain and was noted to be exquisitely  tender in his abdomen and he was evaluated for that and eventually the  Hospitalist Service called to assist with management since the patient  was considered too complicated to return to the nursing facility at this  time.   Currently, the patient is  complaining only of abdominal pain.  He says  he is not clear why he was transferred to the hospital.  He does not  know which hospital he is at but he is aware of the date.  He says he  has been having abdominal pain for about a week.  It does not interfere  with his ability to ambulate with the assistance of his walker.  It does  not interfere with his ability to eat and he is eating well.  He has  been having no nausea or vomiting, no syncope and no chest pain.  He  does not pass urine.  He usually gets dialyzed Tuesday's, Thursday's,  Saturday's, but because of the Holiday, he was dialyzed on Tuesday and  Wednesday.   PAST MEDICAL HISTORY:  1. End stage renal disease, started dialysis in December, 2005.  2. Coronary artery disease, status post a non-ST elevation MI in 2004.  3. h/o Diabetes type 1.  4. Hypertension.  5. Migraines.  6. Depression.  7. Peptic ulcer disease.  8. History of cocaine  abuse.  9. 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  10.History of neuropathy.   MEDICATIONS:  1. Lisinopril 40 mg daily.  2. Synthroid 175 mcg daily.  3. Labetalol 400 mg four times daily.  4. Hydralazine 50 mg four times daily.  5. Clonidine 0.2 mg three times daily.  6. Levemir 6 units subQ each morning.  7. NovoLog by sliding scale 3 to 9 units three times daily with meals.  8. Procardia XL 60 mg daily.  9. Calcium carbonate 1000 mg three times daily.  10.Multivitamin one daily.  11.GlycoLax 17 grams daily.  12.Epogen with dialysis.  13.He has other p.r.n. medicines on his MARS, which he has not taken      since being at the skilled nursing facility.   ALLERGIES:  No known drug allergies.   SOCIAL HISTORY:  History of tobacco and polysubstance abuse.  Currently  a nursing home resident.   FAMILY HISTORY:  His father died of a suicide when the patient was age  89.  He had a sister who died of cancer.   REVIEW OF SYSTEMS:  Other than noted above, the  patient denies any  problems on a 10 point review of systems.   PHYSICAL EXAMINATION:  GENERAL:  Ill-looking, middle-aged, African  American gentleman lying on the stretcher that complains only of  abdominal pain.  VITAL SIGNS:  His temperature is 98.6, pulse 58, respirations 20, blood  pressure 143/75.  He is saturating at 100% on 2L.  HEENT:  His pupils are round and equal.  Mucous membranes pale.  He is  anicteric.  No cervical lymphadenopathy or thyromegaly.  CHEST:  He has bibasilar crackles.  CARDIOVASCULAR SYSTEM:  Regular rhythm without murmurs.  ABDOMEN:  Is distended.  He is exquisitely tender.  He has flank  dullness.  Unable to feel any masses because of the tenderness.  He does  not allow deep palpation.  EXTREMITIES:  Are wasted, yet he does have a trace edema bilaterally.  Pulses are 1+ bilaterally.  CENTRAL NERVOUS SYSTEM:  Cranial nerves II-XII appear to be grossly  intact.  He has no lateralizing signs.   LABORATORY DATA:  His white count is 7.9, hemoglobin 10.6, MCV 85.5.  His platelet count are 379,000.  He has a normal differential on his  white count.  His sodium is 134, potassium 4.0, chloride 99, BUN 30,  creatinine 4.8.  Liver function tests are not seen.  A CT scan of his  abdomen with contrast shows questionable small subcapsular fluid  collection of the lateral margin of his spleen, a globular liver,  questionably mildly enlarged, without focal mass, bilateral atelectasis,  tiny effusions, ? basilar infiltrates.  A CT scan of the pelvis shows  sigmoid diverticulosis without wall thickening.  Pelvic small bowel  loops unremarkable.  No pelvic mass, adenopathy or hernia.  Extensive  pelvic ascites.  The bladder is decompressed.  No focal bony  abnormalities.  Question mild diffuse osteosclerosis potentially  represents renal osteodystrophy.   ASSESSMENT:  1. Diabetes, uncontrolled.  2. Acute abdominal pain, questionable peritoneal infection  3.  Ascites.  4. Multiple medical problems.   PLAN:  Will admit this gentleman for monitoring of his blood sugar.  Will start him on Rocephin and Zithromax for a potential chest infection  and will check his LFT's and arrange ultrasound-guided paracentesis in  case he does have spontaneous bacterial peritonitis.  Management of his  chronic medical problems as per orders.  Vania Rea, M.D.  Electronically Signed     LC/MEDQ  D:  03/13/2008  T:  03/13/2008  Job:  [id number]

## 2010-08-30 NOTE — Op Note (Signed)
[name], [name] NO.:  [id number]   MEDICAL RECORD NO.:  [id number]          PATIENT TYPE:  INP   LOCATION:  5505                         FACILITY:  [facility]   PHYSICIAN:  Janetta Hora. Fields, MD  DATE OF BIRTH:  07-26-1955   DATE OF PROCEDURE:  03/26/2008  DATE OF DISCHARGE:                               OPERATIVE REPORT   PROCEDURE:  Right forearm arteriovenous graft.   PREOPERATIVE DIAGNOSIS:  End-stage renal disease.   POSTOPERATIVE DIAGNOSIS:  End-stage renal disease.   ANESTHESIA:  Local with IV sedation.   OPERATIVE FINDINGS:  A 3-mm deep brachial vein.   OPERATIVE DETAILS:  After obtaining informed consent, the patient was  taken to the operating.  The patient was placed in supine position on  the operating table.  After adequate sedation, the patient's entire  right upper extremity was prepped and draped in the usual sterile  fashion.  Local anesthesia was infiltrated near the antecubital crease.  A longitudinal incision was made in this location and carried down  through subcutaneous tissues down level of the brachial artery.  Brachial was dissected free circumferentially.  Vessel loops were placed  proximal and distal at the planned site of arteriotomy.  The adjacent  deep brachial vein was then dissected free circumferentially.  This was  approximately 3 mm in diameter.  Small side branches were ligated and  divided between silk ties.  Next, a subcutaneous tunnel was created in  loop configuration down the forearm with a transverse incision in the  distal forearm for assistance in tunneling.  A 6-mm PTFE graft was then  brought through the subcutaneous tunnel.  The patient was then given  5000 units of intravenous heparin.  Vessel loops were used to control  the brachial artery proximally and distally.  A longitudinal opening was  made in the brachial artery.  The graft was beveled on one end and sewn  end of graft to side of artery using a  running 6-0 Prolene suture.  Just  prior to completion anastomosis, this was fore-bled, back-bled, and  thoroughly flushed.  Anastomosis was secured.  Vessel loops were  released.  There was good pulsatile flow in the graft immediately.  Next, the vein was controlled proximally and distally with fine bulldog  clamps.  A longitudinal opening was made in the vein.  The graft was  beveled and sewn end of graft to side of vein using a running 6-0  Prolene suture.  Just prior to completion anastomosis, this was fore-  bled, back-bled, and thoroughly flushed.  Anastomosis was secured.  Clamps were released.  There was a palpable thrill above the graft  immediately.  Hemostasis was then obtained with thrombin and Gelfoam.  Both incisions were closed with running 3-0 Vicryl suture in the  subcutaneous layer and a 4-0 Vicryl subcuticular stitch in the skin.  The patient had palpable radial pulse at the end of the case.  The  patient tolerated the procedure well.  There were no immediate  complications.  The instrument, sponge, and needle counts were correct  at the end of the  case.  The patient was taken to recovery room in  stable condition.     Janetta Hora. Fields, MD  Electronically Signed    CEF/MEDQ  D:  03/26/2008  T:  03/26/2008  Job:  [id number]

## 2010-09-02 NOTE — H&P (Signed)
NAMEDEQUANTE, TREMAINE NO.:  [id number]   MEDICAL RECORD NO.:  [id number]                   PATIENT TYPE:  INP   LOCATION:  3732                                 FACILITY:  [facility]   PHYSICIAN:  Creta Levin, M.D. [facility]      DATE OF BIRTH:  19-Nov-1955   DATE OF ADMISSION:  06/21/2002  DATE OF DISCHARGE:                                HISTORY & PHYSICAL   CHIEF COMPLAINT:  Non-ST elevation myocardial infarction.   HISTORY OF PRESENT ILLNESS:  The patient is a 55 year old male with a  history of insulin-dependent diabetes who was transferred from [facility] for cardiac catheterization.  He was admitted there on February 24,  unresponsive, with a non-ST elevation myocardial infarction, DKA, and shock.  This was all in the setting of cocaine abuse.  The patient received  excellent medical care, and made a favorable recovery.  Today he was  transferred for further evaluation of his coronary artery disease.  When I  first met the patient, he was somnolent and found to have a blood glucose of  25.  After response to D50, he was able to provide a limited history.  He  denied chest discomfort, dyspnea, orthopnea, PND, or edema.  His history was  limited, however, due to his immense sadness due to the death of his sister  yesterday.   PAST MEDICAL HISTORY:  1. Coronary artery disease as evidence by his recent non-ST elevation     myocardial infarction.  2. Diabetes.  3. Hypertension.  4. Migraine headache.  5. Depression.  6. Peptic ulcer disease.   SOCIAL HISTORY:  [place]  He has a 20-  pack smoking history, and smokes approximately one pack per day now.  He  does drink alcohol on occasion.  He admits to cocaine use, and was found to  be positive on 06/10/02 at the time of his admission with DKA and shock.   ALLERGIES:  The patient has no known drug allergies.   MEDICATIONS:  On transfer:  1. Aspirin  325 mg p.o. every day  2. Zocor 20 mg every day  3. Plavix 75 mg every day  4. Coreg 37.5 mg b.i.d.  5. Clonidine 0.1 mg t.i.d.  6. Prevacid 30 mg every day  7. Valsartan 160 mg every day  8. Lantus 25 units q.h.s.  9. NPH 20 units every day  10.      Sliding scale insulin.  11.      Synthroid 50 micrograms every day  12.      Albuterol and Atrovent.   FAMILY HISTORY:  His mother is alive and in her 28's.  His father died of  suicide when the patient was age 31.  Siblings as mentioned, he did have a  sister who died yesterday of cancer.  Details of this are unknown.   REVIEW OF SYSTEMS:  Constitutional symptoms were negative.  HEENT:  Negative.  Skin:  Negative.  Cardiac and pulmonary:  Currently negative.  GU:  Negative.  Neurologic/Psyche:  Depression and crying.  Musculoskeletal:  He does admit to generalized weakness in the lower extremity, and loss of  sensation which is chronic.  GI:  Currently negative.  Endocrine negative.  He is a full code.   PHYSICAL EXAMINATION:  VITAL SIGNS:  Temperature 36.5, blood pressure  160/92, heart rate 66 and regular.  He is saturating 94% on room air, and  has a respiratory rate of 18.  GENERAL:  He is an ill-appearing, middle-aged man who is in no acute  distress and is slightly somnolent.  HEENT:  Unremarkable except for some poor dentition, and looks to be mostly  edentulous.  Mucous membranes are moist.  NECK:  Exam reveals no thyromegaly.  He had normal jugular venous pulses.  He had a normal carotid upstroke and no bruits.  CARDIOVASCULAR:  Exam revealed a regular rate and rhythm with an S4 gallop,  but no murmur or rub.  LUNGS:  Significant for some mild bibasilar crackles that cleared with  coughing.  SKIN:  Exam revealed hyperpigmented patches throughout his lower  extremities.  ABDOMEN:  Exam was soft, nondistended, nontender without organomegaly.  RECTAL:  Exam revealed grossly heme-negative stool.  EXTREMITIES:  Revealed no  edema.  He had increased sensation to fine touch  to the calves bilaterally.  He had 2+ femoral pulses.  He did have a right  femoral vein triple lumen catheter in place which appeared to be clean, dry  and intact.  NEUROLOGIC:  He was initially somnolent but aroused with D50.  He was  nonfocal, and alert and oriented x3.   LABORATORY DATA:  Chest x-ray is pending.  His ECG from the outside hospital  revealed some nonspecific ST-T wave changes, but otherwise no abnormalities.  His labs from the outside hospital on 06/17/02, white count was 8.0,  hemoglobin and hematocrit were 10.2 and 28.7 respectively.  Platelet count  was 100.  His Chem-7 from 06/21/02:  Sodium was 134, potassium 3.9, chloride  104, bicarbonate 26, BUN 16, creatinine 1.6, glucose 88.  (His creatinine  peaked at 4 on 07/12/02).  His albumin on 06/15/02 was 2.0.  His CK-MB and  troponin peaked on 06/13/02 at 2,192, 84, and 44.2.  Labs from this hospital  are currently pending.   ASSESSMENT AND PLAN:  1. Non ST elevation myocardial infarction.  This occurred in the setting of     cocaine abuse and diabetic ketoacidosis.  His peak biomarkers were on     06/13/02, and he is currently without symptoms.  No heparin was given     because he had reportedly had coffee grounds on placement of the NG tube.     He was also anemic, and it was felt that he may have had some upper GI     bleeding.  I am not sure whether he received a blood transfusion, but his     hematocrit has been stable in the recent past, though it is low.  We will     continue aspirin at 162 mg per day.  We will continue Coreg at 25 mg     b.i.d.  We will change the ACE angiotension receptor blocker to an ACE     inhibitor given that his creatinine has normalized.  We will also     continue Zocor.  The patient will likely undergo cardiac catheterization  on Monday for risk stratification. 2. Diabetes.  His blood glucose was 25 on arrival.  He did respond to D50.      We will change his regimen to something more conservative, and adjust it     as needed.  3. Hypertension.  We will wean the clonidine as this is probably not the     best medicine for him.  We will add hydrochlorothiazide and amlodipine in     addition to the beta-blocker and ACE inhibitor.  4. Anemia.  We will check a ferritin and a reticulocyte count.  If there is     evidence of ongoing GI blood loss, we will consider a GI consultation for     a colonoscopy or perhaps even an upper endoscopy.  We will continue the     proton pump inhibitor on a b.i.d. regimen for now.  5. Hypothyroidism.  This was recently diagnosed, and he was started on     Synthroid only in the past week.  We will continue the current dose, and     follow this up as an outpatient.  6. Chronic renal insufficiency.  We will keep him euvolemic and add Mucomyst     prior to his cardiac catheterization.  7. Cocaine abuse.  The patient was not interested in discussing this at this     time.  We will likely need help from a multidisciplinary prospective, and     I have already consulted physical therapy, occupational therapy and     social work.  If we do have any programs that my help him to avoid     cocaine in the future, this will certainly be of benefit.  Similarly, I     have discussed smoking cessation, and the patient is interested in     discontinuing smoking at this time.  8. Deconditioning neuropathy.  It is unclear how deconditioned the patient     is, but I am concerned that his albumin is 2.0, and that he does appear     to have wasted lower-extremity muscles.  We will see what physical     therapy and occupational therapy have to say.  He may need rehabilitation     or at least home health nursing to get him back on his feet.                                               Creta Levin, M.D. [facility]    RPK/MEDQ  D:  06/21/2002  T:  06/22/2002  Job:  [id number]

## 2010-09-02 NOTE — Cardiovascular Report (Signed)
   Marcus [name], Marcus [name] NO.:  [id number]   MEDICAL RECORD NO.:  [id number]                   PATIENT TYPE:  INP   LOCATION:  3732                                 FACILITY:  [facility]   PHYSICIAN:  Rollene Rotunda, M.D. [facility]            DATE OF BIRTH:  Oct 02, 1955   DATE OF PROCEDURE:  06/23/2002  DATE OF DISCHARGE:                              CARDIAC CATHETERIZATION   PROCEDURE:  Left heart catheterization/coronary arteriography.   INDICATIONS FOR PROCEDURE:  Evaluate patient with non-Q-wave myocardial  infarction.   PROCEDURAL NOTE:  Left heart catheterization was performed via the right  femoral artery.  The artery was cannulated using an anterior wall puncture.  A 6-French arterial sheath was inserted via the modified Seldinger  technique.  Preformed Judkins and a pigtail catheter were utilized.  The  patient tolerated the procedure well and left the lab in stable condition.   RESULTS:  1. Hemodynamics     A. LV:  117/14.     B. AO:  118/82.  2. Coronaries     A. The left main had 25% mid stenosis.     B. The LAD had proximal 25% mid stenosis and mid luminal irregularities.        The first diagonal was moderate-sized and free of disease.  A second        diagonal was moderate size with luminal irregularities.     C. The circumflex was a large vessel with a proximal AV groove, having        25% stenosis.     D. The right coronary artery was a dominant vessel.  There was a long        proximal and mid 25% stenosis.     E. Left ventriculogram:  The left ventricle was not injected secondary to        renal insufficiency.   CONCLUSION:  Minimal coronary artery plaque.  The patient will continue to  have aggressive primary risk reduction.                                                  Rollene Rotunda, M.D. [facility]    JH/MEDQ  D:  06/23/2002  T:  06/23/2002  Job:  [id number]   cc:   Gilford Raid Harsh  [address]  [place]  [place] [address]  Fax:  [phone]

## 2010-09-02 NOTE — Op Note (Signed)
Marcus [name], Marcus [name] NO.:  [id number]   MEDICAL RECORD NO.:  [id number]          PATIENT TYPE:  INP   LOCATION:  5502                         FACILITY:  [facility]   PHYSICIAN:  Balinda Quails, M.D.    DATE OF BIRTH:  18-Mar-1956   DATE OF PROCEDURE:  04/01/2004  DATE OF DISCHARGE:                                 OPERATIVE REPORT   PREOPERATIVE DIAGNOSIS:  End-stage renal failure.   POSTOPERATIVE DIAGNOSIS:  End-stage renal failure.   PROCEDURE:  1.  Ultrasound, right neck.  2.  Insertion of right internal jugular Diatek catheter.   SURGEON:  Balinda Quails, M.D.   ASSISTANT:  Nurse.   ANESTHESIA:  Local with MAC.   DESCRIPTION OF PROCEDURE:  The patient was brought to the operating room in  stable condition, placed in the supine position.  The right neck prepped and  draped in a sterile fashion.  Ultrasound of the right neck was carried out.  The right internal jugular vein appeared widely patent.  This is a large  caliber vein with normal compressibility.   Skin and subcutaneous tissue in the base of the neck was instilled with 1%  Xylocaine.  A needle was used to introduce the right internal jugular vein.  The +2.5 K-wire was passed through the needle into the superior vena cava.  The needle was removed at the site of #11 blade.  The 12, 14 and 16 dilators  were advanced over the guidewire.  A 16 dilator and tear away sheath were  advanced over the guidewire.  The dilator and guidewire were removed.  The  catheter was then placed through the sheath, positioned to the superior vena  cava at the superior vena cava right atrial junction.  The sheath was  removed.  Subcutaneous tunnel was created.  The capsule was brought through  the tunnel and the hub mechanism assembled.  Catheter was flushed with  heparin and saline solution and capped with heparin.  Insertion site was  closed with interrupted 3-0 nylon suture.  The catheter was affixed to the  skin with  interrupted 2-0 silk suture.  Sterile dressing was applied.   The patient tolerated the procedure well.  No apparent complications.  Transferred to the recovery room in stable condition.       PGH/MEDQ  D:  04/01/2004  T:  04/03/2004  Job:  [id number]

## 2010-09-02 NOTE — Consult Note (Signed)
NAME:  Marcus [name], Marcus [name] NO.:  [id number]   MEDICAL RECORD NO.:  [id number]                   PATIENT TYPE:  EMS   LOCATION:  MAJO                                 FACILITY:  [facility]   PHYSICIAN:  Marcus [name], M.D. [facility]         DATE OF BIRTH:  17-Sep-1955   DATE OF CONSULTATION:  06/30/2002  DATE OF DISCHARGE:                                   CONSULTATION   PRIMARY CARE PHYSICIAN:  Marcus [name], M.D.   PRIMARY CARDIOLOGIST:  Marcus [name], M.D., [place].   REFERRING PHYSICIAN:  Doug [name], M.D., [facility].   CHIEF COMPLAINT:  Chest and epigastric discomfort.   HISTORY OF PRESENT ILLNESS:  Marcus [name] is a 55 year old gentleman who was  hospitalized from 06/21/02 to 06/26/02 with non ST segment elevation myocardial  infarction in the setting of cocaine abuse.  Cardiac catheterization  demonstrated minimal coronary plaque.  He was discharged on Friday.  He  represents today with substernal chest discomfort that he says has been  essentially constant since discharge, but exacerbated with each meal he has  eaten since then.  He has had mild associated nausea and questionable  diaphoresis.  He has had no emesis, diarrhea, fever, palpitations, syncope,  or dyspnea.  He denies any cocaine and alcohol use.  Prior to my evaluation,  he was administered a GI cocktail with relief of his discomfort.   PAST MEDICAL HISTORY:  Diabetes mellitus, hypothyroidism, chronic renal  insufficiency, depression, peptic ulcer disease, diabetic neuropathy,  diabetic nephropathy, hypertension, status post cocaine-induced non ST  segment elevation myocardial infarction.   CURRENT MEDICATIONS:  (Compliance is questionable, but discharge medications  were):  Lantus insulin 10 units q.h.s., Humalog insulin 6 units three times  daily with meals, Synthroid 50 mcg daily, Zoloft 50 mg daily to be increased  to 100 mg daily yesterday, the patient did not make  this increase today;  trazodone 50 mg p.o. q.h.s. p.r.n. sleep, Zocor 40 mg at bedtime, aspirin  162 mg per day, Coreg 25 mg twice per day, HCTZ 25 mg per day, Norvasc 5 mg  per day, Nexium 40 mg per day, Catapres 0.1 mg twice per day, Humibid LA 600  mg twice per day, Neurontin 600 mg three times per day, Altace 10 mg per  day.   ALLERGIES:  NO KNOWN DRUG ALLERGIES.   SOCIAL HISTORY:  [place]  He smokes  approximately one pack per day, drinks occasional alcohol, denies cocaine  use since discharge.   FAMILY HISTORY:  Mother is alive in her 66s.  Father died of suicide when  the patient was 59 years old.  Sister recently died of cancer.   REVIEW OF SYSTEMS:  Negative in detail except as above.   PHYSICAL EXAMINATION:  GENERAL:  He is a chronically ill-appearing,  disheveled man who fails to make eye contact with the examiner.  Affect  is  quite depressed.  NECK:  He has no jugular venous distention.  LUNGS:  Clear to auscultation.  HEART:  He has a regular rate and rhythm with an S4.  There is no murmur,  rub, or S3.  ABDOMEN:  Soft, non-distended, nontender.  There is no hepatosplenomegaly.  Bowel sounds are normal.  EXTREMITIES:  Warm, without clubbing, cyanosis, edema, or ulceration.  Femoral pulses are 2+ bilaterally.  NEUROLOGIC:  While his affect was depressed, he was awake, alert, oriented,  and conversant.  SKIN:  He has hyperpigmented patches throughout his lower extremities.   Electrocardiogram:  Normal sinus rhythm with nonspecific ST-T abnormalities  in the lateral leads.   LABORATORY DATA:  Sodium 129, potassium 5.1, bicarbonate 25, glucose 506,  BUN 40, creatinine 1.8, albumin 2.3.  Troponin 0.02.  WBC 9.6, hematocrit  32, platelets 695,000.  INR 0.9, PTT 28.  Toxicity screen negative for  opiates, cocaine, benzodiazepines, amphetamines.  CK is 52 and CK-MB is 2.3.   IMPRESSION/RECOMMENDATIONS:  A 55 year old gentleman with  recent cocaine-  induced myocardial infarction with minimal coronary disease at  catheterization.  He now presents with constant pain for three days with the  pain exacerbated by meals and relieved with a GI cocktail in the emergency  room.  I suspect that this represents peptic ulcer disease versus  gastroesophageal reflux.  The normal troponin and long-standing pain  essentially exclude myocardial ischemia.  I am gratified that has apparently  not been using cocaine since his discharge.   We have asked internal medicine to evaluate him for admission regarding his  hyperglycemia, hypertension, and probable medical noncompliance.  We would  ask them to please reconsult should any cardiac issues arise.                                               Marcus [name], M.D. [facility]    WED/MEDQ  D:  06/30/2002  T:  07/01/2002  Job:  [id number]   cc:   Marcus [name], M.D.   Marcus [name], M.D.  [place], [place]   Marcus [name], M.D.  [address]  [place] [address]  Fax: [phone]

## 2010-09-02 NOTE — H&P (Signed)
NAMESEICHI, KAUFHOLD NO.:  [id number]   MEDICAL RECORD NO.:  [id number]          PATIENT TYPE:  INP   LOCATION:  2906                         FACILITY:  [facility]   PHYSICIAN:  Aram Beecham B. Eliott Nine, M.D.DATE OF BIRTH:  1955-11-02   DATE OF ADMISSION:  03/31/2004  DATE OF DISCHARGE:                                HISTORY & PHYSICAL   HISTORY OF PRESENT ILLNESS:  This is a 55 year old black male recent  resident (three days) of [facility] in [place] with a past medical history  significant for diabetes, hypertension, coronary artery disease by report,  and chronic kidney disease with the last known creatinine prior to present  values of 2.5 in July of 2005.  He is transferred from [facility]  with oliguria and renal failure.  He was hospitalized there on March 28, 2004 with complaints of chest pain, dyspnea, and swelling.  He was found to  have a creatinine of 15.5, CO2 of 13, and a hemoglobin of 5.8.  He received  IV fluids, sodium bicarbonate and packed red blood cells with continued  oliguria at 500 to 600 cc per day and no appreciable improvement in  creatinine, falling to only 13.   Renal ultrasound revealed 11.2 and 11.1 cm kidney right and left  respectively with no hydronephrosis.  There was some increase in  echogenicity.  Because of his lack of improvement in renal function, he is  transferred here for further observation, blood pressure control, and  possibly dialysis.   PAST MEDICAL HISTORY:  1.  Diabetes since age 28.  2.  Hypertension, longstanding with questionable degree of control.  3.  Coronary artery disease by report with no details available and evidence      for normal left ventricular function by 2-D echocardiogram in February      and December of 2005.  4.  Diverticular disease with history of prior colonoscopy.  5.  Gastroparesis.  6.  Hyperlipidemia.  7.  Hypothyroidism.  8.  History of urethral stricture, dilated by Dr.  Suann Larry.  9.  History of substance abuse including cocaine.   ALLERGIES:  PENICILLIN.   MEDICATIONS:  1.  Lantus insulin 10 units q.d.  2.  Nicotine patch 7 mg q.d.  3.  Nitroglycerin paste 1 inch q.6h. plus a nitroglycerin drip.  4.  Norvasc 10 mg q.d.  5.  Synthroid 0.150 mg q.d.  6.  Toprol XL 100 mg b.i.d.  7.  Zoloft 100 mg q.d.  8.  Protonix 40 mg q.d.  9.  Lipitor 20 mg q.d.  10. Clonidine 0.1 mg t.i.d. p.r.n.  11. Sliding scale Regular insulin.  12. Sodium bicarbonate drip at [facility].  13. Reglan 10 mg IV a.c. and h.s.  14. Zaroxolyn 10 mg q.d.  15. Lasix 160 mg q.6h. (Lasix and Zaroxolyn just increased to these doses      prior to his transfer).  16. Other medications included Restoril, laxative of choice, Vicodin,      alprazolam, Mylanta, benzonatate, Tussionex, and Morphine p.r.n.   FAMILY HISTORY:  Noncontributory.   SOCIAL HISTORY:  The patient  just moved to [place] three days  prior to his current admission.  He does have a history of cocaine abuse and  is not currently using tobacco or alcohol but says he smoked until three  days ago.   REVIEW OF SYMPTOMS:  Positive for swelling and particularly leg swelling,  severe scrotal edema, shortness of breath.  He denies chest pain presently.  He does report but no anorexia.  He has had some sleep disturbance but no  pruritus, rash, dysuria, or hematuria.  He does report some epigastric  abdominal discomfort.   PHYSICAL EXAMINATION:  GENERAL:  A pleasant, somewhat slow gentleman in no  acute distress.  VITAL SIGNS:  Blood pressure 156/87, heart rate 74 and regular.  O2  saturation 97% on two liters.  HEENT:  He is edentulous.  NECK:  He has JVD to the jaw angle but no carotid bruits heard.  LUNGS:  He has crackles at both lung bases bilaterally.  CARDIOVASCULAR:  Somewhat distant heart sounds.  Positive S4.  Normal S1 and  S2.  No audible S3.  ABDOMEN:  He has pitting edema of the abdominal  wall which extends down into  the scrotal and penile areas.  Liver edge is felt 2 cm below the right  costal margin.  He has some mild epigastric abdominal tenderness.  There are  no abdominal bruits.  There is no rebound tenderness.  EXTREMITIES:  There is 2 to 3+ pretibial pitting edema to the hips.  Dorsalis pedis pulses and posterior tibial pulses are 1+ and symmetric.  Femoral pulses are 1+.  He has no femoral bruits.   LABORATORY DATA:  From [facility] today included WBC 8600, hemoglobin  10.6 after two units of packed cells.  BUN and creatinine 113 and 13  respectively.  Potassium 4.1, calcium 5.9, albumin 2.8, CPK 1000 (down from  1244) with MBs of 18.2, 16.3, and 19.1.  TSH was elevated at 19.04 with a T4  less than 0.1.  Urinalysis on admission showed a specific gravity of 1.025,  3+ protein, 1 to 3 white cells, and 2 to 5 red cells.  CPKs are as  previously mentioned.  Troponins were flat 0.02, 0.02, and 0.00.   Chest x-ray from March 28, 2004 showed edema with bilateral pleural  effusions.  Renal ultrasound showed no hydronephrosis, mass, stone, or cyst.  Kidneys measured 11.1 and 11.2 cm bilaterally with a slight increase in  echogenicity.  Bilateral effusions and ascites were also noted on the  ultrasound.  Echocardiogram showed normal sinus rhythm with no acute  abnormalities.  A 2-D echocardiogram showed an ejection fraction of 65%,  mild left atrial enlargement, and concentric LVH.  There was not much  difference compared to an echocardiogram of May 31, 2003 which showed  LVH but was otherwise a normal study.   IMPRESSION:  1.  A 55 year old black male with chronic kidney disease, diabetes, and      hypertension with one advanced renal failure.  It is unclear if this is      acute on chronic or if it represents progression of his underling     disease.  He has not shown appreciable improvement over the past three      days and now is hypertensive and  volume overloaded with some uremic      symptoms.  We will treat aggressively with Lasix 160 mg IV q.6h. (this      dose was just increased at the  time of transfer) along with Zaroxolyn      and if no response then he will certainly require dialysis within the      next 24 hours.  We will save his left arm, avoid intravenous and needle      sticks there, show dialysis videos, and initiate a renal diet.  2.  Hypertension, poor control:  He has had recent increases in doses of      Toprol and Norvasc.  We will continue these along with diuretics and      p.r.n. Clonidine.  We will stop his intravenous nitroglycerin drip.  3.  Anemia:  Hemoglobin is greater than 10 after packed cells.  We will      check iron total iron binding capacity and start EPO.  We will check      stools for occult blood.  4.  Chest pain with questionable history of coronary artery disease:      Myocardial infarction was ruled out by enzymes.  He had no      electrocardiogram changes.  A 2-D echocardiogram was negative for wall      motion abnormalities.  We will consider a cardiology consult and a      Cardiolite study at some point during this admission.  We will continue      nitrates topically and discontinue the intravenous nitroglycerin drip.  5.  Diabetes:  Continue Lantus and sliding scale insulin.  6.  Hyperlipidemia:  Continue Lipitor.  7.  Hypothyroidism:  Thyroid-stimulating hormones was markedly elevated with      a low free T4 on his current thyroid dose of 1.15 mg a day.  We will      increase to 0.175 mg per day and continue to follow.  8.  Gastroparesis:  We will continue Reglan but change intravenous doses to      p.o.  9.  Hypocalcemia:  Check phosphorus.  Start Os-Cal t.i.d. with meals.  Check      vile intact PTH.       CBD/MEDQ  D:  03/31/2004  T:  03/31/2004  Job:  [id number]

## 2010-09-02 NOTE — Discharge Summary (Signed)
Marcus [name], ROSTRON NO.:  [id number]   MEDICAL RECORD NO.:  [id number]          PATIENT TYPE:  INP   LOCATION:  5502                         FACILITY:  [facility]   PHYSICIAN:  Cecille Aver, M.D.DATE OF BIRTH:  [birth year]   DATE OF ADMISSION:  03/31/2004  DATE OF DISCHARGE:                                 DISCHARGE SUMMARY   DISCHARGE DIAGNOSES:  1.  Chronic kidney disease now with end-stage renal disease.      1.  New start to hemodialysis.  2.  Diabetes mellitus type 2.  3.  Anemia of chronic disease.  4.  Poor mobility.  5.  Hypertension.   PROCEDURE:  On April 01, 2004:  1.  Ultrasound right neck.  2.  Insertion of right internal jugular Diatek catheter.  Procedure      performed by Dr. Liliane Bade.   Hemodialysis:  a.  First hemodialysis April 01, 2004.   HISTORY OF PRESENT ILLNESS:  Mr. Burchill is a 56 year old black male recent  resident, (three days), of [facility] in [place] with a past medical history  significant for diabetes, hypertension, coronary artery disease by report,  and chronic kidney disease with the last BUN and creatinine prior to present  values of 2.5 in July of 2005.  He is transferred from [facility]  with oliguria and renal failure.  He was hospitalized there on March 28, 2004 with complaints of chest pain, dyspnea and swelling.  He was found to  have a creatinine of 15.5, CO2 of 13 and a hemoglobin of 5.8.  He received  IV fluids, sodium bicarbonate and packed red blood cells with continued  oliguria at 500-600 cc per day and no appreciable improvement in creatinine,  falling to only 13.   Renal ultrasound revealed 11.2 and 11.1 cm kidney, right and left,  respectively with no hydronephrosis.  There was some increase in  echogenicity.  Because of his lack of improvement in renal function, he is  transferred here for further observation, blood pressure control, and  possibly dialysis.   ADMISSION  LABORATORY DATA:  From [facility] on date of admission:  White blood cell count 8600, hemoglobin 10.6, status post two units of  packed red blood cells.  BUN and creatinine 113 and 13, respectively.  potassium 4.1, calcium 5.9, albumin 2.8, CPK 1000, (down from less than 0.1)  with MB of 18.2, 16.3 and 19.1.  TSH was elevated at 19.04 with a T4 less  than 0.1.  Urinalysis on admission showed a specific gravity of 1.025, 3+  protein, 1-3 white cells, and 2-5 red cells.  CPK's are previously  mentioned.  Troponins were flat, 0.02, 0.02, and 0.00.   ADMISSION X-RAYS:  Chest x-ray from March 28, 2004 showed edema with  bilateral pleural effusions.  Renal ultrasound showed no hydronephrosis,  mass, or cyst.  Kidneys measured 11.2 and 11.2 cm bilaterally with a slight  increase in echogenicity. Bilateral effusions and ascites were also noted on  the ultrasound.  Echocardiogram showed normal sinus rhythm with no acute  abnormalities.  A 2D echocardiogram showed an  ejection fraction of 65%, mild  left atrial enlargement, and concentric left ventricular hypertrophy.  There  was no much difference compared to an echocardiogram of May 31, 2003  which showed left ventricular hypertrophy but was otherwise a normal study.   HOSPITAL COURSE:  1.  Previous chronic kidney disease now end-stage renal disease.  The      patient was admitted.  It was decided by Dr. Kathrene Bongo to treat      aggressive with Lasix 160 mg IV q.6h. along with Zaroxolyn.  He would be      monitored over the next 24 hours.  It was also decided that we would      save his left arm, avoid intravenous and needle sticks there, educate      the patient with dialysis video and initiate a renal diet.  It was noted      on April 01, 2004 that the patient was not having great response to      the IV Lasix.  He still experienced bilateral crackles on physical      examination with lower extremity 1+ edema.  The plan was  made to insert      a right internal jugular which was done by Dr. Madilyn Fireman as listed above.      The patient began his first dialysis treatment on April 01, 2004.  He      had a net UF of 3000 at that time.  His blood pressures did remain in      the 180's systolic.  The patient did generally well with his first      dialysis treatment.  The patient's IV Lasix was discontinued.  He did      begin another dialysis treatment on April 02, 2004.  Ultrafiltration      was done per patient's weight which was monitored daily.  He was placed      on a renal vitamin.  The patient had a total of four hemodialysis      treatments during this hospital stay.  The patient will begin      hemodialysis at [facility].  It will be noted that he needs to be      evaluated for a permanent access in the near future as we have saved his      left arm for that.  There was no further intervention.   1.  Diabetes mellitus type 2.  CBG's were checked q.a.c. and h.s.  He was      placed on his usual home regimen to include Lantus.  He was also placed      on a sliding scale insulin.  CBG's were excessively spontaneous to      include a range of low 90's to the upper 400's.  Adjustments were made      to the sliding scale insulin as well as to his Lantus dose which was      increased to 15 units daily.  There were no further interventions noted.      During his hemodialysis in [facility], it should be noted that the      patient should have his Accu-chek's performed with each dialysis      treatment.  The patient's final dose of Lantus was increased to 20 units      daily.   1.  Iron-deficiency anemia.  The patient's hemoglobin was found to be at      lowest, 9.5.  Iron studies were  performed and iron was 26 with percent      saturation 16 and a ferritin of 447.  The patient was started on InFeD     to include an initial test dose.  The patient was started on Epogen to      begin an IV every dialysis treatment  as well as the iron therapy.  There      was no further intervention.   1.  Poor mobility.  The patient exhibited deconditioning during this      hospital stay.  Orders were written for the patient to be out of bed to      chair.  A physical therapy consult was also initiated.  The patient will      get physical therapy evaluation and treatment at [facility].   1.  Hypertension.  The patient's home medications to include Toprol and      Norvasc were continued.  He was also placed on p.r.n. clonidine upon      admission.  His nitroglycerin drip was discontinued.  With poor control      and response to Lasix therapy and after initiation of hemodialysis,      ultrafiltration was increased and approximately 5 liters were removed      with each dialysis treatment.  His blood pressures were monitored      closely and last hemodialysis treatment on April 05, 2004 showed a      systolic blood pressure in the 90's.  The patient will be monitored      closely in the hemodialysis center.  We will continue to push his      estimated dry weight as appropriate.  At present, the patient will      continue on Toprol and Norvasc as well as Lisinopril at home and this      will be adjusted per nephrologist in [facility].   DISCHARGE MEDICATIONS:  1.  Norvasc 10 mg p.o. q.h.s.  2.  Synthroid 175 mcg one p.o. daily.  3.  Protonix 40 mg daily.  4.  Reglan 10 mg a.c. and h.s.  5.  Zoloft 100 mg daily.  6.  Toprol XL 100 mg b.i.d.  7.  Lantus 20 units subcutaneous daily.  8.  Lisinopril 20 mg b.i.d.  9.  NephroVite one tablet p.o. daily.  10. Tylenol 650 mg q.4-6h. p.r.n. pain.  11. Ultram 50 mg p.o. b.i.d. p.r.n. pain.  12. Calcium carbonate 500 mg three tablets t.i.d. a.c.  13. Sliding scale Humalog insulin with the following instructions:  CBG's      101-150 one unit, CBG's 151-200 two units, CBG's 201-250 four units,      CBG's 251-300 six units, CBG's 301-350 eight units, CBG's  351-400 10      units, CBG's 401-450 12 units and CBG's greater than 451 14 units and      recheck in one hour.   HEMODIALYSIS ORDERS:  The patient will be dialyzed for four hours.  He will  be on Venofer 100 mg IV every week.  Calcitriol dose will be 0.5 mcg IV  every hemodialysis treatment.  He will be on a 400 blood flow rate and an  800 dialysis rate.  He will be on the Optilux 200 NR and standard heparin.  His new estimated dry weight will be 63.0 kg.  However, this will need to be  monitored closely to establish a more permanent estimated dry weight.   DISCHARGE INSTRUCTIONS:  The  patient will be discharged to [facility].  He will need to remain on a renal diet to include  ProMod one scoop t.i.d. with meals.  He will be on 80 grams of protein, 2  grams of salt, 2 grams of potassium with a 1200 cc fluid restriction daily.  SPECIAL INSTRUCTIONS:  The patient will have no left arm IV's or blood  pressure draws.  The patient will need a permanent access in the near future  and we would like to save his left arm.       MY/MEDQ  D:  04/06/2004  T:  04/06/2004  Job:  [id number]   cc:   Lorraine Lax [facility]ialysis [facility]   Cecille Aver, M.D.  [address]  [place]  [place] [address]  Fax: [phone]

## 2010-09-02 NOTE — Consult Note (Signed)
[place]  Patient:    Marcus [name], Marcus [name]                       MRN: [id number] Proc. Date: 09/22/00 Adm. Date:  [id number] Attending:  Trauma, Md                          Consultation Report  HISTORY OF PRESENT ILLNESS:  I was called to see him today on 09/22/00 with the history that he was under a car, the car jack slipped, the car fell on him and he injured his neck and left ribs.  Apparently he was admitted via the trauma service.  I was called to see him in consultation for his neck.  He complained of some pain in his neck and his left upper back region.  PAST MEDICAL HISTORY:  He is diabetic, apparently uncontrolled.  The nurses informed me that he was tested positive for multiple drugs.  PHYSICAL EXAMINATION:  NECK/BACK:  He is not able to give me a good history but he is alert enough to obey commands.  I examined his neck.  I removed his cervical collar.  He is tender along the posterior aspect of his neck and his left trapezius region. He has a small hematoma on the left trapezius with a superficial abrasion.  No deep lacerations.  His AC joints and clavicles appear to be negative.  His shoulders per say are negative.  I get no signs of any fractures in his upper or lower extremities.  He has no pain in his lower back.  Minimal pain in the upper back.  His hips and legs have no signs of any fractures or dislocations.  NEUROLOGIC: Neurologically, he appears to be intact in regard to the injury to his neck.  Circulation is intact.  In regard to his feet, he has large, dried, blistered areas of the plantar aspect of both great toes.  LABORATORY AND X-RAY DATA:  I reviewed the CT scan of his neck and his x-rays of his chest.  1) He has multiple fractures of the left ribs.  2) He has multiple transverse fractures of the lower cervical and upper    thoracic.  IMPRESSION: 1. Diabetes mellitus, uncontrolled. 2. Cervical spine transverse process  fractures as well as thoracic    transverse process fractures. 3. Rib fractures on the left.  NOTE:  I am going to simply get a soft cervical collar for him.  He is going to need to get the skin team for treatment of his toes and he is going to need to get a diabetic consult in to get his diabetes under control. DD:  09/22/00 TD:  09/23/00 Job: [id number]

## 2010-09-02 NOTE — Discharge Summary (Signed)
[place]  Patient:    Marcus [name], Marcus [name]                       MRN: [id number] Proc. Date: 10/18/00 Adm. Date:  [id number] Disc. Date: [id number] Attending:  Trauma, Md Dictator:   Eugenia Pancoast, P.A.                           Discharge Summary  DATE OF BIRTH:  Aug 12, 1955.  ADMITTING PHYSICIAN:  Velora Heckler, M.D.  FINAL DIAGNOSES: 1. Crush injury to chest. 2. Multiple transverse process fractures, C1. 3. Upper thoracic rib fracture. 4. Left second and third rib fractures. 5. History of diabetes mellitus. 6. History of hypertension. 7. Substance abuse.  HISTORY OF PRESENT ILLNESS:  This is a 55 year old who was working on his car when it fell off the jack stand and caught the patient across the right upper chest wall area.  He was subsequently brought to emergency room by EMS.  He was awake and alert at time of arrival and was subsequently worked in the emergency room.  X-rays were performed and chest x-ray showed upper thoracic and rib fractures and no acute pulmonary findings noted.  CT of the head was negative.  CT of the cervical spine showed left-sided transverse process fractures C7, T1, T2, and T3 and fracture of the second and third left rib.  Otherwise no other issues were noted.  The patient was subsequently hospitalized for follow up pain management.  He did well over the next few days.  His pain was well controlled.  He did have some episodes of nausea and vomiting which resolved on their own.  He was up and ambulating satisfactorily.  His diabetes was controlled.  He had an episode of hypoglycemia.  He also had vomiting and this was resolved with insulin.  It was discussed with the patient about following up for his diabetes.  He was subsequently prepared for discharge.  DISCHARGE MEDICATIONS:  Discharge was performed and the patient was given: 1. Lentis Insulin and Humalog insulin to take home. 2. He is to also  continue on his Altace 10 mg q.d. 3. Zoloft 100 mg q.d. 4. Trazodone 50 mg at bedtime. 5. Nexium 40 mg at bedtime. 6. Vicodin 1-2 p.o. q.4-6h. p.r.n. for pain.  FOLLOWUP:  The patient has not followed up on his diabetes in a while based on his follow up for his diabetes.  He follows with Dr. Smitty Cords in [place].  He is supposed to see him within 1 week.  Also, because of his fractures he is to see Dr. Darrelyn Hillock, orthopedics, as needed.  He was given Dr. Joellyn Rued number.  He is to follow up with the trauma clinic as needed.  The patient is subsequently discharged in satisfactory and stable condition on September 25, 2000. DD:  10/18/00 TD:  10/18/00 Job: [id number]

## 2010-09-02 NOTE — Discharge Summary (Signed)
Marcus [name], Marcus [name] NO.:  [id number]   MEDICAL RECORD NO.:  [id number]                   PATIENT TYPE:  INP   LOCATION:  3732                                 FACILITY:  [facility]   PHYSICIAN:  Vida Roller, M.D.                DATE OF BIRTH:  [birth year]   DATE OF ADMISSION:  06/21/2002  DATE OF DISCHARGE:  06/26/2002                                 DISCHARGE SUMMARY   DISCHARGE DIAGNOSES:  1. Status post cocaine-induced myocardial infarction (non-ST-elevation     myocardial infarction).  2. Labile hypertension.  3. Diabetes mellitus, type 1.  4. Anemia.  5. History of minor gastrointestinal bleed this admission.  6. Hypothyroidism.  7. Chronic renal insufficiency.  8. Cocaine abuse.  9. 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  10.      History of peptic ulcer disease.  11.      Neuropathy.   PROCEDURES PERFORMED THIS ADMISSION:  Cardiac catheterization by Dr. Rollene Rotunda on June 23, 2002 revealing left main 25% midstenosis, left anterior  descending proximal 25% and midstenosis and mid-luminal irregularities,  first diagonal moderate-sized and free of disease and second diagonal was  moderate-sized with luminal irregularities.  Circumflex was a large vessel  with proximal atrioventricular groove having 25% stenosis.  The right  coronary artery was a dominant vessel with long proximal and mid 25%  stenosis.  Left ventriculogram not performed secondary to renal  insufficiency.   HOSPITAL COURSE:  Please refer to the admission history and physical done by  Dr. Creta Levin on June 21, 2002 for complete details.  This 55-  year-old male was transferred from [facility] for cardiac  catheterization on June 21, 2002 after having suffered non-ST-elevation,  cocaine-induced myocardial infarction.  While at [facility], he also  experienced a minor GI bleed, DKA and shock.  He had a peaked creatinine  of  4.4 during his admission in [facility].  After being stabilized, he  was transferred to [facility] for further evaluation for his  myocardial infarction.  On admission to [facility], the patient's  creatinine was down to 1.6.  He was sent for a cardiac catheterization on  June 23, 2002 by Dr. Rollene Rotunda.  Results are noted above.  Dr. Antoine Poche  noted that patient had minimal coronary artery plaque and that continued  aggressive primary risk reduction would be performed.  The patient was  stabilized on medications over the next several days and was ready for  discharge on March 11th.   Regarding the patient's hypertension, his blood pressure was somewhat labile  this admission.  His diltiazem had been discontinued on admission at  [facility].  He had Norvasc, hydrochlorothiazide, Coreg and Catapres  all added to his regimen for hypertensive control.  Still, his blood  pressure was somewhat labile.  It has been recommended that the patient  follow up with his primary care physician to continue adjusting his  medications for control of his blood pressure.   Regarding the patient's diabetes, he was seen by our primary care team in  consultation.  His diabetic regimen was adjusted to Lantus insulin 10 units  subcu q.h.s. and Humalog insulin 6 units t.i.d. with meals.   Our primary care team also saw the patient regarding his hypothyroidism.  His TSH was noted to be 20.25.  It was felt that the patient may be  noncompliant with his medication.  It was suggested that he remain on the  current dose of 50 mcg a day, given his recent MI.  This can be adjusted  later as an outpatient.   Regarding the patient's mental health, he was seen by our psychiatry team at  [facility].  Dr. Antonietta Breach saw the patient on June 25, 2002  and diagnosed him with a depressive disorder, recurrent, moderate versus  dependency.  He discussed the indications,  alternative medications and side-  effects of Zoloft and trazodone with the patient.  It was decided that he  would continue on Zoloft, titrating by 50 mg per week to a maximum dose of  150 mg a day.  He will take trazodone at bedtime as needed for sleep.  He  would be arranged for outpatient followup with mental health through our  case management team.   The patient was noted to have a minor GI bleed while at [facility].  He was consulted by the gastroenterology team at that time.  He was placed  on IV proton pump inhibitors for a short period of time and then reverted  back to p.o. proton pump inhibitors.  The patient remained stable throughout  the rest of his admission in this regard.  Stool guaiac on the date of  discharge was negative.  Recommended followup with his primary care  physician was planned.  The patient's hemoglobin at discharge was 9.5 and  hematocrit was 27.3.   The patient had an abnormal-appearing chest x-ray on June 20, 2000.  This  revealed bibasilar opacities, effusions and possible atelectasis.  Followup  was suggested.  Followup chest x-ray on June 25, 2002 revealed an interval  improvement in bibasilar aeration.  Residual changes were present with  bilateral pleural effusions.   LABORATORY DATA:  White blood cell count 10,000, hemoglobin 9.5, hematocrit  27.3, platelets 477,000, MCV 88.4, reticulocyte percentage 1.1%, absolute  reticulocyte count 37.4.  INR on March 6th was 0.8.  On March 10th, sodium  131, potassium 4.3, chloride 98, CO2 25, glucose 147, BUN 20, creatinine  1.8, calcium 7.9.  Magnesium on March 6th 1.8.  Repeat TSH here at [facility] on March 10th, 45.516.  Ferritin 175 ng/mL.  On March 6th,  total protein 4.9, albumin 1.5, AST 24, ALT 10, alkaline phosphatase 53,  total bilirubin 0.2.   DISCHARGE MEDICATIONS:  1. Lantus insulin 10 units subcutaneously at bedtime.  2. Humalog insulin 6 units three times a day with  meals. 3. Synthroid 50 mcg daily.  4. Zoloft 50 mg daily until March 14th, then increase to 100 mg daily for a     total of seven days, then increase to 150 mg daily.  (The patient already     has a prescription for Zoloft and he has 100 mg tablets.  He has been     advised he can take a half a tablet of the 100 mg  to make up the dose     regimen that has just been prescribed.)  5. Trazodone 50 to 100 mg nightly for sleep.  6. Zocor 40 mg q.h.s.  7. Aspirin 81 mg two tablets daily.  8. Coreg 25 mg b.i.d.  9. Hydrochlorothiazide 25 mg daily.  10.      Norvasc 5 mg daily.  11.      Nexium 40 mg daily.  12.      Catapres 0.1 mg b.i.d.  13.      Humibid LA 600 mg two tablets twice daily as needed.  14.      Neurontin as taken previously.  15.      Altace 10 mg daily.   ACTIVITY:  Activity as tolerated.   DIET:  Low-fat, low-sodium, 2000-calorie diabetic diet.   SPECIAL DISCHARGE INSTRUCTIONS:  He is to call our office in [facility] for  any concerns over groin swelling, bleeding or bruising or drainage.   FOLLOWUP:  At the time of this dictation, the case management team is still  working on getting him set up with mental health followup.  Hopefully, he  will be set up to be seen at [facility] in [place] within the [place]  system, otherwise, he is agreeable to contact the [facility] for followup.   Internal medicine saw the patient and advised him to keep checking his blood  sugars four times daily before meals and at bedtime and document them.  He  will bring these numbers to his appointment with Dr. Maisie Fus.  The  patient is to follow up with Dr. Rebecca Eaton next week and he should call for an  appointment.     Tereso Newcomer, P.A.                        Vida Roller, M.D.    SW/MEDQ  D:  06/26/2002  T:  06/27/2002  Job:  [id number]   cc:   Maisie Fus M.D.   Antonietta Breach, M.D.  [address]  [place], [place] [address]   Fax: [phone]

## 2011-01-17 LAB — COMPREHENSIVE METABOLIC PANEL
Albumin: 2.4 g/dL — ABNORMAL LOW (ref 3.5–5.2)
BUN: 40 mg/dL — ABNORMAL HIGH (ref 6–23)
CO2: 23 mEq/L (ref 19–32)
Calcium: 8.2 mg/dL — ABNORMAL LOW (ref 8.4–10.5)
Chloride: 90 mEq/L — ABNORMAL LOW (ref 96–112)
Creatinine, Ser: 5.68 mg/dL — ABNORMAL HIGH (ref 0.4–1.5)
GFR calc Af Amer: 13 mL/min — ABNORMAL LOW (ref >60)
Glucose, Bld: 130 mg/dL — ABNORMAL HIGH (ref 70–99)
Potassium: 5.1 mEq/L (ref 3.5–5.1)
Sodium: 124 mEq/L — ABNORMAL LOW (ref 135–145)
Total Protein: 7 g/dL (ref 6.0–8.3)

## 2011-01-17 LAB — CBC
HCT: 32.2 % — ABNORMAL LOW (ref 39.0–52.0)
HCT: 33.9 % — ABNORMAL LOW (ref 39.0–52.0)
Hemoglobin: 10.6 g/dL — ABNORMAL LOW (ref 13.0–17.0)
Hemoglobin: 10.9 g/dL — ABNORMAL LOW (ref 13.0–17.0)
MCHC: 32.5 g/dL (ref 30.0–36.0)
MCHC: 32.9 g/dL (ref 30.0–36.0)
MCV: 86.9 fL (ref 78.0–100.0)
MCV: 86.9 fL (ref 78.0–100.0)
MCV: 87.3 fL (ref 78.0–100.0)
Platelets: 309 10*3/uL (ref 150–400)
Platelets: 368 10*3/uL (ref 150–400)
RBC: 3.74 MIL/uL — ABNORMAL LOW (ref 4.22–5.81)
RBC: 3.76 MIL/uL — ABNORMAL LOW (ref 4.22–5.81)
RBC: 3.78 MIL/uL — ABNORMAL LOW (ref 4.22–5.81)
RBC: 3.91 MIL/uL — ABNORMAL LOW (ref 4.22–5.81)
WBC: 4.8 10*3/uL (ref 4.0–10.5)
WBC: 7.9 10*3/uL (ref 4.0–10.5)

## 2011-01-17 LAB — GLUCOSE, CAPILLARY
Glucose-Capillary: 110 mg/dL — ABNORMAL HIGH (ref 70–99)
Glucose-Capillary: 143 mg/dL — ABNORMAL HIGH (ref 70–99)
Glucose-Capillary: 154 mg/dL — ABNORMAL HIGH (ref 70–99)
Glucose-Capillary: 171 mg/dL — ABNORMAL HIGH (ref 70–99)
Glucose-Capillary: 21 mg/dL — CL (ref 70–99)
Glucose-Capillary: 226 mg/dL — ABNORMAL HIGH (ref 70–99)
Glucose-Capillary: 249 mg/dL — ABNORMAL HIGH (ref 70–99)
Glucose-Capillary: 256 mg/dL — ABNORMAL HIGH (ref 70–99)
Glucose-Capillary: 260 mg/dL — ABNORMAL HIGH (ref 70–99)
Glucose-Capillary: 411 mg/dL — ABNORMAL HIGH (ref 70–99)
Glucose-Capillary: 56 mg/dL — ABNORMAL LOW (ref 70–99)
Glucose-Capillary: 65 mg/dL — ABNORMAL LOW (ref 70–99)
Glucose-Capillary: 78 mg/dL (ref 70–99)

## 2011-01-17 LAB — PHOSPHORUS: Phosphorus: 3.5 mg/dL (ref 2.3–4.6)

## 2011-01-17 LAB — LIPASE, BLOOD: Lipase: 14 U/L (ref 11–59)

## 2011-01-17 LAB — HEPATIC FUNCTION PANEL
ALT: <8 U/L (ref 0–53)
Alkaline Phosphatase: 120 U/L — ABNORMAL HIGH (ref 39–117)
Alkaline Phosphatase: 137 U/L — ABNORMAL HIGH (ref 39–117)
Bilirubin, Direct: 0.1 mg/dL (ref 0.0–0.3)
Indirect Bilirubin: 0.5 mg/dL (ref 0.3–0.9)
Indirect Bilirubin: 1 mg/dL — ABNORMAL HIGH (ref 0.3–0.9)
Total Bilirubin: 0.6 mg/dL (ref 0.3–1.2)
Total Bilirubin: 1.1 mg/dL (ref 0.3–1.2)

## 2011-01-17 LAB — BASIC METABOLIC PANEL
BUN: 23 mg/dL (ref 6–23)
Chloride: 95 mEq/L — ABNORMAL LOW (ref 96–112)
Creatinine, Ser: 3.95 mg/dL — ABNORMAL HIGH (ref 0.4–1.5)
GFR calc Af Amer: 19 mL/min — ABNORMAL LOW (ref >60)
GFR calc non Af Amer: 16 mL/min — ABNORMAL LOW (ref >60)

## 2011-01-17 LAB — DIFFERENTIAL
Basophils Absolute: 0.1 10*3/uL (ref 0.0–0.1)
Basophils Relative: 0 % (ref 0–1)
Eosinophils Relative: 5 % (ref 0–5)
Lymphocytes Relative: 12 % (ref 12–46)
Lymphocytes Relative: 12 % (ref 12–46)
Lymphs Abs: 0.9 10*3/uL (ref 0.7–4.0)
Lymphs Abs: 0.9 10*3/uL (ref 0.7–4.0)
Monocytes Relative: 8 % (ref 3–12)
Monocytes Relative: 9 % (ref 3–12)
Neutro Abs: 5.8 10*3/uL (ref 1.7–7.7)
Neutro Abs: 5.9 10*3/uL (ref 1.7–7.7)

## 2011-01-17 LAB — LACTATE DEHYDROGENASE, PLEURAL OR PERITONEAL FLUID

## 2011-01-17 LAB — POCT I-STAT, CHEM 8
Chloride: 99 mEq/L (ref 96–112)
Creatinine, Ser: 4.8 mg/dL — ABNORMAL HIGH (ref 0.4–1.5)
HCT: 35 % — ABNORMAL LOW (ref 39.0–52.0)
Hemoglobin: 11.9 g/dL — ABNORMAL LOW (ref 13.0–17.0)
Potassium: 4 mEq/L (ref 3.5–5.1)
Sodium: 134 mEq/L — ABNORMAL LOW (ref 135–145)

## 2011-01-17 LAB — AMMONIA: Ammonia: 37 umol/L — ABNORMAL HIGH (ref 11–35)

## 2011-01-17 LAB — BODY FLUID CELL COUNT WITH DIFFERENTIAL
Lymphs, Fluid: 65 %
Monocyte-Macrophage-Serous Fluid: 30 % — ABNORMAL LOW (ref 50–90)
Neutrophil Count, Fluid: 4 % (ref 0–25)
Total Nucleated Cell Count, Fluid: 755 cu mm (ref 0–1000)

## 2011-01-17 LAB — GLUCOSE, SEROUS FLUID: Glucose, Fluid: 149 mg/dL

## 2011-01-17 LAB — PROTIME-INR: Prothrombin Time: 13.9 seconds (ref 11.6–15.2)

## 2011-01-17 LAB — BODY FLUID CULTURE: Culture: NO GROWTH

## 2011-01-17 LAB — FOLATE: Folate: >20 ng/mL

## 2011-01-17 LAB — CLOSTRIDIUM DIFFICILE EIA

## 2011-01-19 LAB — BASIC METABOLIC PANEL
BUN: 18 mg/dL (ref 6–23)
BUN: 21 mg/dL (ref 6–23)
BUN: 26 mg/dL — ABNORMAL HIGH (ref 6–23)
BUN: 26 mg/dL — ABNORMAL HIGH (ref 6–23)
BUN: 28 mg/dL — ABNORMAL HIGH (ref 6–23)
BUN: 38 mg/dL — ABNORMAL HIGH (ref 6–23)
BUN: 41 mg/dL — ABNORMAL HIGH (ref 6–23)
BUN: 45 mg/dL — ABNORMAL HIGH (ref 6–23)
BUN: 62 mg/dL — ABNORMAL HIGH (ref 6–23)
CO2: 25 mEq/L (ref 19–32)
CO2: 27 mEq/L (ref 19–32)
CO2: 27 mEq/L (ref 19–32)
CO2: 27 mEq/L (ref 19–32)
CO2: 28 mEq/L (ref 19–32)
CO2: 30 mEq/L (ref 19–32)
Calcium: 8.1 mg/dL — ABNORMAL LOW (ref 8.4–10.5)
Calcium: 8.1 mg/dL — ABNORMAL LOW (ref 8.4–10.5)
Calcium: 8.5 mg/dL (ref 8.4–10.5)
Calcium: 8.6 mg/dL (ref 8.4–10.5)
Calcium: 8.8 mg/dL (ref 8.4–10.5)
Calcium: 8.9 mg/dL (ref 8.4–10.5)
Calcium: 9.1 mg/dL (ref 8.4–10.5)
Calcium: 9.6 mg/dL (ref 8.4–10.5)
Chloride: 91 mEq/L — ABNORMAL LOW (ref 96–112)
Chloride: 92 mEq/L — ABNORMAL LOW (ref 96–112)
Chloride: 92 mEq/L — ABNORMAL LOW (ref 96–112)
Chloride: 93 mEq/L — ABNORMAL LOW (ref 96–112)
Chloride: 93 mEq/L — ABNORMAL LOW (ref 96–112)
Chloride: 93 mEq/L — ABNORMAL LOW (ref 96–112)
Chloride: 95 mEq/L — ABNORMAL LOW (ref 96–112)
Chloride: 95 mEq/L — ABNORMAL LOW (ref 96–112)
Chloride: 96 mEq/L (ref 96–112)
Creatinine, Ser: 3.24 mg/dL — ABNORMAL HIGH (ref 0.4–1.5)
Creatinine, Ser: 3.26 mg/dL — ABNORMAL HIGH (ref 0.4–1.5)
Creatinine, Ser: 3.45 mg/dL — ABNORMAL HIGH (ref 0.4–1.5)
Creatinine, Ser: 3.53 mg/dL — ABNORMAL HIGH (ref 0.4–1.5)
Creatinine, Ser: 3.95 mg/dL — ABNORMAL HIGH (ref 0.4–1.5)
Creatinine, Ser: 4.3 mg/dL — ABNORMAL HIGH (ref 0.4–1.5)
Creatinine, Ser: 4.67 mg/dL — ABNORMAL HIGH (ref 0.4–1.5)
Creatinine, Ser: 5.18 mg/dL — ABNORMAL HIGH (ref 0.4–1.5)
GFR calc Af Amer: 13 mL/min — ABNORMAL LOW (ref >60)
GFR calc Af Amer: 14 mL/min — ABNORMAL LOW (ref >60)
GFR calc Af Amer: 14 mL/min — ABNORMAL LOW (ref >60)
GFR calc Af Amer: 15 mL/min — ABNORMAL LOW (ref >60)
GFR calc Af Amer: 19 mL/min — ABNORMAL LOW (ref >60)
GFR calc Af Amer: 23 mL/min — ABNORMAL LOW (ref >60)
GFR calc Af Amer: 24 mL/min — ABNORMAL LOW (ref >60)
GFR calc Af Amer: 24 mL/min — ABNORMAL LOW (ref >60)
GFR calc Af Amer: 25 mL/min — ABNORMAL LOW (ref >60)
GFR calc non Af Amer: 10 mL/min — ABNORMAL LOW (ref >60)
GFR calc non Af Amer: 11 mL/min — ABNORMAL LOW (ref >60)
GFR calc non Af Amer: 11 mL/min — ABNORMAL LOW (ref >60)
GFR calc non Af Amer: 12 mL/min — ABNORMAL LOW (ref >60)
GFR calc non Af Amer: 12 mL/min — ABNORMAL LOW (ref >60)
GFR calc non Af Amer: 13 mL/min — ABNORMAL LOW (ref >60)
GFR calc non Af Amer: 13 mL/min — ABNORMAL LOW (ref >60)
GFR calc non Af Amer: 16 mL/min — ABNORMAL LOW (ref >60)
GFR calc non Af Amer: 18 mL/min — ABNORMAL LOW (ref >60)
GFR calc non Af Amer: 18 mL/min — ABNORMAL LOW (ref >60)
GFR calc non Af Amer: 20 mL/min — ABNORMAL LOW (ref >60)
GFR calc non Af Amer: 21 mL/min — ABNORMAL LOW (ref >60)
Glucose, Bld: 136 mg/dL — ABNORMAL HIGH (ref 70–99)
Glucose, Bld: 161 mg/dL — ABNORMAL HIGH (ref 70–99)
Glucose, Bld: 204 mg/dL — ABNORMAL HIGH (ref 70–99)
Glucose, Bld: 275 mg/dL — ABNORMAL HIGH (ref 70–99)
Glucose, Bld: 38 mg/dL — CL (ref 70–99)
Glucose, Bld: 50 mg/dL — ABNORMAL LOW (ref 70–99)
Glucose, Bld: 83 mg/dL (ref 70–99)
Potassium: 3.8 mEq/L (ref 3.5–5.1)
Potassium: 4.1 mEq/L (ref 3.5–5.1)
Potassium: 4.6 mEq/L (ref 3.5–5.1)
Potassium: 5 mEq/L (ref 3.5–5.1)
Potassium: 5 mEq/L (ref 3.5–5.1)
Potassium: 5.2 mEq/L — ABNORMAL HIGH (ref 3.5–5.1)
Potassium: 5.5 mEq/L — ABNORMAL HIGH (ref 3.5–5.1)
Potassium: 5.6 mEq/L — ABNORMAL HIGH (ref 3.5–5.1)
Potassium: 5.6 mEq/L — ABNORMAL HIGH (ref 3.5–5.1)
Potassium: 5.9 mEq/L — ABNORMAL HIGH (ref 3.5–5.1)
Sodium: 126 mEq/L — ABNORMAL LOW (ref 135–145)
Sodium: 128 mEq/L — ABNORMAL LOW (ref 135–145)
Sodium: 129 mEq/L — ABNORMAL LOW (ref 135–145)
Sodium: 129 mEq/L — ABNORMAL LOW (ref 135–145)
Sodium: 130 mEq/L — ABNORMAL LOW (ref 135–145)
Sodium: 130 mEq/L — ABNORMAL LOW (ref 135–145)
Sodium: 130 mEq/L — ABNORMAL LOW (ref 135–145)
Sodium: 131 mEq/L — ABNORMAL LOW (ref 135–145)
Sodium: 132 mEq/L — ABNORMAL LOW (ref 135–145)
Sodium: 132 mEq/L — ABNORMAL LOW (ref 135–145)
Sodium: 134 mEq/L — ABNORMAL LOW (ref 135–145)

## 2011-01-19 LAB — POCT I-STAT 4, (NA,K, GLUC, HGB,HCT)
HCT: 36 % — ABNORMAL LOW (ref 39.0–52.0)
Hemoglobin: 12.2 g/dL — ABNORMAL LOW (ref 13.0–17.0)
Sodium: 129 mEq/L — ABNORMAL LOW (ref 135–145)

## 2011-01-19 LAB — RENAL FUNCTION PANEL
Albumin: 2.2 g/dL — ABNORMAL LOW (ref 3.5–5.2)
Albumin: 2.4 g/dL — ABNORMAL LOW (ref 3.5–5.2)
Albumin: 2.6 g/dL — ABNORMAL LOW (ref 3.5–5.2)
BUN: 37 mg/dL — ABNORMAL HIGH (ref 6–23)
BUN: 42 mg/dL — ABNORMAL HIGH (ref 6–23)
BUN: 43 mg/dL — ABNORMAL HIGH (ref 6–23)
BUN: 45 mg/dL — ABNORMAL HIGH (ref 6–23)
BUN: 54 mg/dL — ABNORMAL HIGH (ref 6–23)
CO2: 24 mEq/L (ref 19–32)
CO2: 26 mEq/L (ref 19–32)
CO2: 27 mEq/L (ref 19–32)
Calcium: 8.4 mg/dL (ref 8.4–10.5)
Calcium: 8.7 mg/dL (ref 8.4–10.5)
Calcium: 8.7 mg/dL (ref 8.4–10.5)
Chloride: 90 mEq/L — ABNORMAL LOW (ref 96–112)
Chloride: 93 mEq/L — ABNORMAL LOW (ref 96–112)
Chloride: 95 mEq/L — ABNORMAL LOW (ref 96–112)
Chloride: 96 mEq/L (ref 96–112)
Chloride: 97 mEq/L (ref 96–112)
Creatinine, Ser: 5.19 mg/dL — ABNORMAL HIGH (ref 0.4–1.5)
Creatinine, Ser: 5.32 mg/dL — ABNORMAL HIGH (ref 0.4–1.5)
GFR calc Af Amer: 14 mL/min — ABNORMAL LOW (ref >60)
GFR calc Af Amer: 15 mL/min — ABNORMAL LOW (ref >60)
GFR calc Af Amer: 16 mL/min — ABNORMAL LOW (ref >60)
GFR calc non Af Amer: 10 mL/min — ABNORMAL LOW (ref >60)
GFR calc non Af Amer: 11 mL/min — ABNORMAL LOW (ref >60)
GFR calc non Af Amer: 12 mL/min — ABNORMAL LOW (ref >60)
GFR calc non Af Amer: 13 mL/min — ABNORMAL LOW (ref >60)
Glucose, Bld: 175 mg/dL — ABNORMAL HIGH (ref 70–99)
Glucose, Bld: 187 mg/dL — ABNORMAL HIGH (ref 70–99)
Glucose, Bld: 260 mg/dL — ABNORMAL HIGH (ref 70–99)
Glucose, Bld: 343 mg/dL — ABNORMAL HIGH (ref 70–99)
Phosphorus: 1.7 mg/dL — ABNORMAL LOW (ref 2.3–4.6)
Phosphorus: 3.4 mg/dL (ref 2.3–4.6)
Phosphorus: 4.8 mg/dL — ABNORMAL HIGH (ref 2.3–4.6)
Phosphorus: 5.1 mg/dL — ABNORMAL HIGH (ref 2.3–4.6)
Potassium: 4.5 mEq/L (ref 3.5–5.1)
Potassium: 4.9 mEq/L (ref 3.5–5.1)
Potassium: 5.5 mEq/L — ABNORMAL HIGH (ref 3.5–5.1)
Potassium: 5.6 mEq/L — ABNORMAL HIGH (ref 3.5–5.1)
Potassium: 5.9 mEq/L — ABNORMAL HIGH (ref 3.5–5.1)
Potassium: 6.1 mEq/L — ABNORMAL HIGH (ref 3.5–5.1)
Sodium: 127 mEq/L — ABNORMAL LOW (ref 135–145)
Sodium: 128 mEq/L — ABNORMAL LOW (ref 135–145)
Sodium: 129 mEq/L — ABNORMAL LOW (ref 135–145)
Sodium: 133 mEq/L — ABNORMAL LOW (ref 135–145)

## 2011-01-19 LAB — CBC
HCT: 31.3 % — ABNORMAL LOW (ref 39.0–52.0)
HCT: 31.7 % — ABNORMAL LOW (ref 39.0–52.0)
HCT: 32 % — ABNORMAL LOW (ref 39.0–52.0)
HCT: 33 % — ABNORMAL LOW (ref 39.0–52.0)
HCT: 45.6 % (ref 39.0–52.0)
Hemoglobin: 10.5 g/dL — ABNORMAL LOW (ref 13.0–17.0)
Hemoglobin: 10.6 g/dL — ABNORMAL LOW (ref 13.0–17.0)
Hemoglobin: 11.6 g/dL — ABNORMAL LOW (ref 13.0–17.0)
Hemoglobin: 12.3 g/dL — ABNORMAL LOW (ref 13.0–17.0)
Hemoglobin: 12.4 g/dL — ABNORMAL LOW (ref 13.0–17.0)
Hemoglobin: 12.6 g/dL — ABNORMAL LOW (ref 13.0–17.0)
Hemoglobin: 14.7 g/dL (ref 13.0–17.0)
MCHC: 32.6 g/dL (ref 30.0–36.0)
MCHC: 33.5 g/dL (ref 30.0–36.0)
MCV: 86.8 fL (ref 78.0–100.0)
MCV: 88 fL (ref 78.0–100.0)
MCV: 89 fL (ref 78.0–100.0)
MCV: 89.3 fL (ref 78.0–100.0)
Platelets: 131 10*3/uL — ABNORMAL LOW (ref 150–400)
Platelets: 205 10*3/uL (ref 150–400)
Platelets: 237 10*3/uL (ref 150–400)
Platelets: 285 10*3/uL (ref 150–400)
Platelets: 302 10*3/uL (ref 150–400)
Platelets: 315 10*3/uL (ref 150–400)
Platelets: 349 10*3/uL (ref 150–400)
RBC: 3.58 MIL/uL — ABNORMAL LOW (ref 4.22–5.81)
RBC: 3.65 MIL/uL — ABNORMAL LOW (ref 4.22–5.81)
RBC: 4.06 MIL/uL — ABNORMAL LOW (ref 4.22–5.81)
RBC: 4.23 MIL/uL (ref 4.22–5.81)
RBC: 4.27 MIL/uL (ref 4.22–5.81)
RBC: 4.41 MIL/uL (ref 4.22–5.81)
RBC: 5.11 MIL/uL (ref 4.22–5.81)
RDW: 20.5 % — ABNORMAL HIGH (ref 11.5–15.5)
RDW: 21.5 % — ABNORMAL HIGH (ref 11.5–15.5)
RDW: 21.9 % — ABNORMAL HIGH (ref 11.5–15.5)
RDW: 21.9 % — ABNORMAL HIGH (ref 11.5–15.5)
RDW: 23.3 % — ABNORMAL HIGH (ref 11.5–15.5)
RDW: 23.4 % — ABNORMAL HIGH (ref 11.5–15.5)
WBC: 4.2 10*3/uL (ref 4.0–10.5)
WBC: 4.4 10*3/uL (ref 4.0–10.5)
WBC: 4.8 10*3/uL (ref 4.0–10.5)
WBC: 4.9 10*3/uL (ref 4.0–10.5)
WBC: 4.9 10*3/uL (ref 4.0–10.5)
WBC: 5 10*3/uL (ref 4.0–10.5)
WBC: 5 10*3/uL (ref 4.0–10.5)
WBC: 5.2 10*3/uL (ref 4.0–10.5)
WBC: 5.4 10*3/uL (ref 4.0–10.5)
WBC: 5.6 10*3/uL (ref 4.0–10.5)

## 2011-01-19 LAB — GLUCOSE, CAPILLARY
Glucose-Capillary: 104 mg/dL — ABNORMAL HIGH (ref 70–99)
Glucose-Capillary: 105 mg/dL — ABNORMAL HIGH (ref 70–99)
Glucose-Capillary: 113 mg/dL — ABNORMAL HIGH (ref 70–99)
Glucose-Capillary: 114 mg/dL — ABNORMAL HIGH (ref 70–99)
Glucose-Capillary: 120 mg/dL — ABNORMAL HIGH (ref 70–99)
Glucose-Capillary: 121 mg/dL — ABNORMAL HIGH (ref 70–99)
Glucose-Capillary: 127 mg/dL — ABNORMAL HIGH (ref 70–99)
Glucose-Capillary: 137 mg/dL — ABNORMAL HIGH (ref 70–99)
Glucose-Capillary: 142 mg/dL — ABNORMAL HIGH (ref 70–99)
Glucose-Capillary: 144 mg/dL — ABNORMAL HIGH (ref 70–99)
Glucose-Capillary: 153 mg/dL — ABNORMAL HIGH (ref 70–99)
Glucose-Capillary: 156 mg/dL — ABNORMAL HIGH (ref 70–99)
Glucose-Capillary: 156 mg/dL — ABNORMAL HIGH (ref 70–99)
Glucose-Capillary: 162 mg/dL — ABNORMAL HIGH (ref 70–99)
Glucose-Capillary: 169 mg/dL — ABNORMAL HIGH (ref 70–99)
Glucose-Capillary: 185 mg/dL — ABNORMAL HIGH (ref 70–99)
Glucose-Capillary: 193 mg/dL — ABNORMAL HIGH (ref 70–99)
Glucose-Capillary: 195 mg/dL — ABNORMAL HIGH (ref 70–99)
Glucose-Capillary: 200 mg/dL — ABNORMAL HIGH (ref 70–99)
Glucose-Capillary: 21 mg/dL — CL (ref 70–99)
Glucose-Capillary: 221 mg/dL — ABNORMAL HIGH (ref 70–99)
Glucose-Capillary: 226 mg/dL — ABNORMAL HIGH (ref 70–99)
Glucose-Capillary: 230 mg/dL — ABNORMAL HIGH (ref 70–99)
Glucose-Capillary: 241 mg/dL — ABNORMAL HIGH (ref 70–99)
Glucose-Capillary: 258 mg/dL — ABNORMAL HIGH (ref 70–99)
Glucose-Capillary: 261 mg/dL — ABNORMAL HIGH (ref 70–99)
Glucose-Capillary: 262 mg/dL — ABNORMAL HIGH (ref 70–99)
Glucose-Capillary: 273 mg/dL — ABNORMAL HIGH (ref 70–99)
Glucose-Capillary: 276 mg/dL — ABNORMAL HIGH (ref 70–99)
Glucose-Capillary: 276 mg/dL — ABNORMAL HIGH (ref 70–99)
Glucose-Capillary: 278 mg/dL — ABNORMAL HIGH (ref 70–99)
Glucose-Capillary: 285 mg/dL — ABNORMAL HIGH (ref 70–99)
Glucose-Capillary: 293 mg/dL — ABNORMAL HIGH (ref 70–99)
Glucose-Capillary: 293 mg/dL — ABNORMAL HIGH (ref 70–99)
Glucose-Capillary: 294 mg/dL — ABNORMAL HIGH (ref 70–99)
Glucose-Capillary: 294 mg/dL — ABNORMAL HIGH (ref 70–99)
Glucose-Capillary: 314 mg/dL — ABNORMAL HIGH (ref 70–99)
Glucose-Capillary: 323 mg/dL — ABNORMAL HIGH (ref 70–99)
Glucose-Capillary: 332 mg/dL — ABNORMAL HIGH (ref 70–99)
Glucose-Capillary: 334 mg/dL — ABNORMAL HIGH (ref 70–99)
Glucose-Capillary: 342 mg/dL — ABNORMAL HIGH (ref 70–99)
Glucose-Capillary: 348 mg/dL — ABNORMAL HIGH (ref 70–99)
Glucose-Capillary: 367 mg/dL — ABNORMAL HIGH (ref 70–99)
Glucose-Capillary: 414 mg/dL — ABNORMAL HIGH (ref 70–99)
Glucose-Capillary: 419 mg/dL — ABNORMAL HIGH (ref 70–99)
Glucose-Capillary: 426 mg/dL — ABNORMAL HIGH (ref 70–99)
Glucose-Capillary: 443 mg/dL — ABNORMAL HIGH (ref 70–99)
Glucose-Capillary: 476 mg/dL — ABNORMAL HIGH (ref 70–99)
Glucose-Capillary: 497 mg/dL — ABNORMAL HIGH (ref 70–99)
Glucose-Capillary: 51 mg/dL — ABNORMAL LOW (ref 70–99)
Glucose-Capillary: 56 mg/dL — ABNORMAL LOW (ref 70–99)
Glucose-Capillary: 587 mg/dL (ref 70–99)
Glucose-Capillary: 62 mg/dL — ABNORMAL LOW (ref 70–99)
Glucose-Capillary: 65 mg/dL — ABNORMAL LOW (ref 70–99)
Glucose-Capillary: 67 mg/dL — ABNORMAL LOW (ref 70–99)
Glucose-Capillary: 70 mg/dL (ref 70–99)
Glucose-Capillary: 76 mg/dL (ref 70–99)
Glucose-Capillary: 81 mg/dL (ref 70–99)
Glucose-Capillary: 82 mg/dL (ref 70–99)
Glucose-Capillary: 84 mg/dL (ref 70–99)
Glucose-Capillary: 93 mg/dL (ref 70–99)
Glucose-Capillary: 95 mg/dL (ref 70–99)
Glucose-Capillary: >600 mg/dL (ref 70–99)
Glucose-Capillary: >600 mg/dL (ref 70–99)
Glucose-Capillary: >600 mg/dL (ref 70–99)
Glucose-Capillary: >600 mg/dL (ref 70–99)

## 2011-01-19 LAB — CARDIAC PANEL(CRET KIN+CKTOT+MB+TROPI)
CK, MB: 3.8 ng/mL (ref 0.3–4.0)
CK, MB: 4 ng/mL (ref 0.3–4.0)
Relative Index: INVALID (ref 0.0–2.5)
Total CK: 53 U/L (ref 7–232)
Total CK: 63 U/L (ref 7–232)
Total CK: 74 U/L (ref 7–232)
Troponin I: 0.03 ng/mL (ref 0.00–0.06)

## 2011-01-19 LAB — DIFFERENTIAL
Lymphocytes Relative: 16 % (ref 12–46)
Lymphs Abs: 0.8 10*3/uL (ref 0.7–4.0)
Monocytes Relative: 13 % — ABNORMAL HIGH (ref 3–12)
Neutrophils Relative %: 67 % (ref 43–77)

## 2011-01-19 LAB — POCT I-STAT, CHEM 8
Chloride: 95 mEq/L — ABNORMAL LOW (ref 96–112)
HCT: 43 % (ref 39.0–52.0)
Hemoglobin: 14.6 g/dL (ref 13.0–17.0)
Potassium: 5 mEq/L (ref 3.5–5.1)
Sodium: 125 mEq/L — ABNORMAL LOW (ref 135–145)

## 2011-01-19 LAB — PROLACTIN: Prolactin: 17.6 ng/mL — ABNORMAL HIGH (ref 2.1–17.1)

## 2011-01-19 LAB — HEPARIN LEVEL (UNFRACTIONATED): Heparin Unfractionated: <0.1 IU/mL — ABNORMAL LOW (ref 0.30–0.70)

## 2011-01-19 LAB — GLUCOSE, RANDOM
Glucose, Bld: 383 mg/dL — ABNORMAL HIGH (ref 70–99)
Glucose, Bld: 393 mg/dL — ABNORMAL HIGH (ref 70–99)
Glucose, Bld: 499 mg/dL — ABNORMAL HIGH (ref 70–99)
Glucose, Bld: 543 mg/dL (ref 70–99)
Glucose, Bld: 626 mg/dL (ref 70–99)

## 2011-01-19 LAB — POCT I-STAT 3, ART BLOOD GAS (G3+)
Acid-Base Excess: 1 mmol/L (ref 0.0–2.0)
pH, Arterial: 7.398 (ref 7.350–7.450)
pO2, Arterial: 70 mmHg — ABNORMAL LOW (ref 80.0–100.0)

## 2011-01-19 LAB — HEMOGLOBIN A1C
Hgb A1c MFr Bld: 7.1 % — ABNORMAL HIGH (ref 4.6–6.1)
Mean Plasma Glucose: 157 mg/dL

## 2011-01-19 LAB — T4, FREE: Free T4: 2.24 ng/dL — ABNORMAL HIGH (ref 0.89–1.80)

## 2011-01-19 LAB — TSH: TSH: 15.634 u[IU]/mL — ABNORMAL HIGH (ref 0.350–4.500)

## 2011-01-19 LAB — HEPATIC FUNCTION PANEL
ALT: 9 U/L (ref 0–53)
AST: 19 U/L (ref 0–37)
Albumin: 2.7 g/dL — ABNORMAL LOW (ref 3.5–5.2)
Alkaline Phosphatase: 96 U/L (ref 39–117)
Total Protein: 8.1 g/dL (ref 6.0–8.3)

## 2011-01-19 LAB — POCT I-STAT 3, VENOUS BLOOD GAS (G3P V)
O2 Saturation: 98 %
pCO2, Ven: 36.4 mmHg — ABNORMAL LOW (ref 45.0–50.0)
pH, Ven: 7.41 — ABNORMAL HIGH (ref 7.250–7.300)

## 2011-01-19 LAB — LIPASE, BLOOD: Lipase: 12 U/L (ref 11–59)

## 2011-01-19 LAB — IRON AND TIBC: Saturation Ratios: 32 % (ref 20–55)

## 2011-01-19 LAB — AMMONIA: Ammonia: 35 umol/L (ref 11–35)

## 2011-01-19 LAB — PHOSPHORUS
Phosphorus: 3.4 mg/dL (ref 2.3–4.6)
Phosphorus: 3.4 mg/dL (ref 2.3–4.6)

## 2011-01-19 LAB — HEPATITIS C ANTIBODY: HCV Ab: NEGATIVE

## 2011-09-21 ENCOUNTER — Encounter (INDEPENDENT_AMBULATORY_CARE_PROVIDER_SITE_OTHER): Payer: Medicare Other | Admitting: Surgery

## 2011-09-22 ENCOUNTER — Inpatient Hospital Stay (HOSPITAL_COMMUNITY): Payer: Medicare Other

## 2011-09-22 ENCOUNTER — Inpatient Hospital Stay (HOSPITAL_COMMUNITY)
Admission: AD | Admit: 2011-09-22 | Discharge: 2011-09-25 | DRG: 682 | Disposition: A | Discharge status: Home / Self Care | Payer: Medicare Other | Source: Other Acute Inpatient Hospital | Attending: Internal Medicine | Admitting: Internal Medicine

## 2011-09-22 ENCOUNTER — Encounter (HOSPITAL_COMMUNITY): Payer: Self-pay | Admitting: Nephrology

## 2011-09-22 DIAGNOSIS — D631 Anemia in chronic kidney disease: Secondary | ICD-10-CM | POA: Diagnosis present

## 2011-09-22 DIAGNOSIS — Z7901 Long term (current) use of anticoagulants: Secondary | ICD-10-CM

## 2011-09-22 DIAGNOSIS — E785 Hyperlipidemia, unspecified: Secondary | ICD-10-CM | POA: Diagnosis present

## 2011-09-22 DIAGNOSIS — E1029 Type 1 diabetes mellitus with other diabetic kidney complication: Secondary | ICD-10-CM | POA: Diagnosis present

## 2011-09-22 DIAGNOSIS — E1101 Type 2 diabetes mellitus with hyperosmolarity with coma: Secondary | ICD-10-CM

## 2011-09-22 DIAGNOSIS — Z992 Dependence on renal dialysis: Secondary | ICD-10-CM

## 2011-09-22 DIAGNOSIS — N186 End stage renal disease: Secondary | ICD-10-CM | POA: Diagnosis present

## 2011-09-22 DIAGNOSIS — I10 Essential (primary) hypertension: Secondary | ICD-10-CM

## 2011-09-22 DIAGNOSIS — I120 Hypertensive chronic kidney disease with stage 5 chronic kidney disease or end stage renal disease: Principal | ICD-10-CM | POA: Diagnosis present

## 2011-09-22 DIAGNOSIS — E039 Hypothyroidism, unspecified: Secondary | ICD-10-CM | POA: Diagnosis present

## 2011-09-22 DIAGNOSIS — Z87891 Personal history of nicotine dependence: Secondary | ICD-10-CM

## 2011-09-22 DIAGNOSIS — E871 Hypo-osmolality and hyponatremia: Secondary | ICD-10-CM | POA: Diagnosis present

## 2011-09-22 DIAGNOSIS — J811 Chronic pulmonary edema: Secondary | ICD-10-CM | POA: Diagnosis present

## 2011-09-22 DIAGNOSIS — N039 Chronic nephritic syndrome with unspecified morphologic changes: Secondary | ICD-10-CM | POA: Diagnosis present

## 2011-09-22 DIAGNOSIS — N2581 Secondary hyperparathyroidism of renal origin: Secondary | ICD-10-CM | POA: Diagnosis present

## 2011-09-22 DIAGNOSIS — E1100 Type 2 diabetes mellitus with hyperosmolarity without nonketotic hyperglycemic-hyperosmolar coma (NKHHC): Secondary | ICD-10-CM | POA: Diagnosis present

## 2011-09-22 DIAGNOSIS — Z8673 Personal history of transient ischemic attack (TIA), and cerebral infarction without residual deficits: Secondary | ICD-10-CM

## 2011-09-22 DIAGNOSIS — I161 Hypertensive emergency: Secondary | ICD-10-CM | POA: Diagnosis present

## 2011-09-22 HISTORY — DX: Type 2 diabetes mellitus with ketoacidosis without coma: E11.10

## 2011-09-22 HISTORY — DX: Tobacco use: Z72.0

## 2011-09-22 HISTORY — DX: Chronic pulmonary edema: J81.1

## 2011-09-22 HISTORY — DX: Gastrointestinal hemorrhage, unspecified: K92.2

## 2011-09-22 HISTORY — DX: Type 2 diabetes mellitus without complications: E11.9

## 2011-09-22 HISTORY — DX: Dependence on renal dialysis: Z99.2

## 2011-09-22 HISTORY — DX: End stage renal disease: N18.6

## 2011-09-22 HISTORY — DX: Hyperlipidemia, unspecified: E78.5

## 2011-09-22 HISTORY — DX: Cocaine abuse, uncomplicated: F14.10

## 2011-09-22 HISTORY — DX: Nontoxic goiter, unspecified: E04.9

## 2011-09-22 HISTORY — DX: Major depressive disorder, single episode, unspecified: F32.9

## 2011-09-22 HISTORY — DX: Hypothyroidism, unspecified: E03.9

## 2011-09-22 HISTORY — DX: Cerebral infarction, unspecified: I63.9

## 2011-09-22 HISTORY — DX: Depression, unspecified: F32.A

## 2011-09-22 HISTORY — DX: Essential (primary) hypertension: I10

## 2011-09-22 LAB — BASIC METABOLIC PANEL
CO2: 27 mEq/L (ref 19–32)
CO2: 29 mEq/L (ref 19–32)
Calcium: 9.1 mg/dL (ref 8.4–10.5)
Chloride: 89 mEq/L — ABNORMAL LOW (ref 96–112)
GFR calc non Af Amer: 19 mL/min — ABNORMAL LOW (ref >90)
Glucose, Bld: 209 mg/dL — ABNORMAL HIGH (ref 70–99)
Potassium: 4.2 mEq/L (ref 3.5–5.1)
Sodium: 131 mEq/L — ABNORMAL LOW (ref 135–145)
Sodium: 136 mEq/L (ref 135–145)

## 2011-09-22 LAB — COMPREHENSIVE METABOLIC PANEL
ALT: 19 U/L (ref 0–53)
AST: 32 U/L (ref 0–37)
Alkaline Phosphatase: 92 U/L (ref 39–117)
CO2: 29 mEq/L (ref 19–32)
Calcium: 9 mg/dL (ref 8.4–10.5)
GFR calc non Af Amer: 7 mL/min — ABNORMAL LOW (ref >90)
Potassium: 4.1 mEq/L (ref 3.5–5.1)
Sodium: 128 mEq/L — ABNORMAL LOW (ref 135–145)

## 2011-09-22 LAB — CBC
HCT: 29.4 % — ABNORMAL LOW (ref 39.0–52.0)
MCV: 88.8 fL (ref 78.0–100.0)
RBC: 3.31 MIL/uL — ABNORMAL LOW (ref 4.22–5.81)
WBC: 8.1 10*3/uL (ref 4.0–10.5)

## 2011-09-22 LAB — CARDIAC PANEL(CRET KIN+CKTOT+MB+TROPI)
CK, MB: 3.8 ng/mL (ref 0.3–4.0)
Relative Index: 3.2 — ABNORMAL HIGH (ref 0.0–2.5)
Relative Index: 3 — ABNORMAL HIGH (ref 0.0–2.5)
Total CK: 117 U/L (ref 7–232)
Total CK: 115 U/L (ref 7–232)

## 2011-09-22 LAB — GLUCOSE, CAPILLARY: Glucose-Capillary: 522 mg/dL — ABNORMAL HIGH (ref 70–99)

## 2011-09-22 LAB — OSMOLALITY: Osmolality: 326 mOsm/kg — ABNORMAL HIGH (ref 275–300)

## 2011-09-22 LAB — POCT I-STAT 3, ART BLOOD GAS (G3+)
Bicarbonate: 29.4 mEq/L — ABNORMAL HIGH (ref 20.0–24.0)
pH, Arterial: 7.346 — ABNORMAL LOW (ref 7.350–7.450)
pO2, Arterial: 41 mmHg — ABNORMAL LOW (ref 80.0–100.0)

## 2011-09-22 LAB — MRSA PCR SCREENING: MRSA by PCR: NEGATIVE

## 2011-09-22 MED ORDER — ZOLPIDEM TARTRATE 5 MG PO TABS: 5.0000 mg | ORAL_TABLET | Freq: Every evening | ORAL | Status: DC | PRN

## 2011-09-22 MED ORDER — ONDANSETRON HCL 4 MG PO TABS: 4.0000 mg | ORAL_TABLET | Freq: Four times a day (QID) | ORAL | Status: DC | PRN

## 2011-09-22 MED ORDER — NICARDIPINE HCL IN NACL 20-0.86 MG/200ML-% IV SOLN
5.0000 mg/h | INTRAVENOUS | Status: DC
  Administered 2011-09-22 – 2011-09-23 (×4): 5 mg/h via INTRAVENOUS
  Filled 2011-09-22 (×9): qty 200

## 2011-09-22 MED ORDER — ACETAMINOPHEN 325 MG PO TABS
650.0000 mg | ORAL_TABLET | Freq: Four times a day (QID) | ORAL | Status: DC | PRN
  Administered 2011-09-24: 650 mg via ORAL
  Filled 2011-09-22: qty 2

## 2011-09-22 MED ORDER — DOCUSATE SODIUM 283 MG RE ENEM
1.0000 | ENEMA | RECTAL | Status: DC | PRN
  Filled 2011-09-22: qty 1

## 2011-09-22 MED ORDER — LIDOCAINE-PRILOCAINE 2.5-2.5 % EX CREA
1.0000 "application " | TOPICAL_CREAM | CUTANEOUS | Status: DC | PRN
  Filled 2011-09-22: qty 5

## 2011-09-22 MED ORDER — SODIUM CHLORIDE 0.9 % IV SOLN: 100.0000 mL | INTRAVENOUS | Status: DC | PRN

## 2011-09-22 MED ORDER — NEPRO/CARBSTEADY PO LIQD
237.0000 mL | Freq: Three times a day (TID) | ORAL | Status: DC | PRN
  Filled 2011-09-22: qty 237

## 2011-09-22 MED ORDER — SODIUM CHLORIDE 0.9 % IV SOLN: 250.0000 mL | INTRAVENOUS | Status: DC | PRN

## 2011-09-22 MED ORDER — WARFARIN - PHARMACIST DOSING INPATIENT: Freq: Every day | Status: DC

## 2011-09-22 MED ORDER — NEPRO/CARBSTEADY PO LIQD
237.0000 mL | ORAL | Status: DC | PRN
  Filled 2011-09-22: qty 237

## 2011-09-22 MED ORDER — SORBITOL 70 % SOLN
30.0000 mL | Status: DC | PRN
  Filled 2011-09-22: qty 30

## 2011-09-22 MED ORDER — PENTAFLUOROPROP-TETRAFLUOROETH EX AERO: 1.0000 "application " | INHALATION_SPRAY | CUTANEOUS | Status: DC | PRN

## 2011-09-22 MED ORDER — HYDROXYZINE HCL 25 MG PO TABS
25.0000 mg | ORAL_TABLET | Freq: Three times a day (TID) | ORAL | Status: DC | PRN
  Filled 2011-09-22: qty 1

## 2011-09-22 MED ORDER — HEPARIN SODIUM (PORCINE) 5000 UNIT/ML IJ SOLN
5000.0000 [IU] | Freq: Three times a day (TID) | INTRAMUSCULAR | Status: DC
  Filled 2011-09-22 (×3): qty 1

## 2011-09-22 MED ORDER — LIDOCAINE HCL (PF) 1 % IJ SOLN: 5.0000 mL | INTRAMUSCULAR | Status: DC | PRN

## 2011-09-22 MED ORDER — ALTEPLASE 2 MG IJ SOLR: 2.0000 mg | Freq: Once | INTRAMUSCULAR | Status: AC | PRN

## 2011-09-22 MED ORDER — PARICALCITOL 5 MCG/ML IV SOLN
INTRAVENOUS | Status: AC
  Administered 2011-09-22: 2 ug via INTRAVENOUS
  Filled 2011-09-22: qty 1

## 2011-09-22 MED ORDER — FENTANYL CITRATE 0.05 MG/ML IJ SOLN
25.0000 ug | INTRAMUSCULAR | Status: DC | PRN
  Administered 2011-09-22: 100 ug via INTRAVENOUS
  Filled 2011-09-22: qty 2

## 2011-09-22 MED ORDER — CAMPHOR-MENTHOL 0.5-0.5 % EX LOTN
1.0000 "application " | TOPICAL_LOTION | Freq: Three times a day (TID) | CUTANEOUS | Status: DC | PRN
  Filled 2011-09-22: qty 222

## 2011-09-22 MED ORDER — SODIUM CHLORIDE 0.9 % IV SOLN
INTRAVENOUS | Status: DC
  Administered 2011-09-22 – 2011-09-23 (×2): via INTRAVENOUS

## 2011-09-22 MED ORDER — HEPARIN SODIUM (PORCINE) 1000 UNIT/ML DIALYSIS
1000.0000 [IU] | INTRAMUSCULAR | Status: DC | PRN
  Filled 2011-09-22: qty 1

## 2011-09-22 MED ORDER — PARICALCITOL 5 MCG/ML IV SOLN
2.0000 ug | INTRAVENOUS | Status: DC
  Administered 2011-09-22 – 2011-09-25 (×2): 2 ug via INTRAVENOUS
  Filled 2011-09-22 (×2): qty 0.4

## 2011-09-22 MED ORDER — CALCIUM CARBONATE 1250 MG/5ML PO SUSP
500.0000 mg | Freq: Four times a day (QID) | ORAL | Status: DC | PRN
  Filled 2011-09-22: qty 5

## 2011-09-22 MED ORDER — DARBEPOETIN ALFA-POLYSORBATE 100 MCG/0.5ML IJ SOLN
100.0000 ug | INTRAMUSCULAR | Status: DC
  Administered 2011-09-25: 100 ug via INTRAVENOUS
  Filled 2011-09-22: qty 0.5

## 2011-09-22 MED ORDER — LEVOTHYROXINE SODIUM 100 MCG IV SOLR
125.0000 ug | Freq: Every day | INTRAVENOUS | Status: DC
  Administered 2011-09-22: 100 ug via INTRAVENOUS
  Administered 2011-09-23: 125 ug via INTRAVENOUS
  Filled 2011-09-22 (×2): qty 6.3

## 2011-09-22 MED ORDER — ACETAMINOPHEN 650 MG RE SUPP: 650.0000 mg | Freq: Four times a day (QID) | RECTAL | Status: DC | PRN

## 2011-09-22 MED ORDER — PANTOPRAZOLE SODIUM 40 MG IV SOLR
40.0000 mg | Freq: Every day | INTRAVENOUS | Status: DC
  Administered 2011-09-23: 40 mg via INTRAVENOUS
  Filled 2011-09-22 (×2): qty 40

## 2011-09-22 MED ORDER — HEPARIN SODIUM (PORCINE) 1000 UNIT/ML DIALYSIS
2400.0000 [IU] | Freq: Once | INTRAMUSCULAR | Status: AC
  Administered 2011-09-22: 2400 [IU] via INTRAVENOUS_CENTRAL

## 2011-09-22 MED ORDER — INSULIN REGULAR HUMAN 100 UNIT/ML IJ SOLN
INTRAMUSCULAR | Status: DC
  Administered 2011-09-22: 5.4 [IU]/h via INTRAVENOUS
  Filled 2011-09-22 (×2): qty 1

## 2011-09-22 MED ORDER — ONDANSETRON HCL 4 MG/2ML IJ SOLN: 4.0000 mg | Freq: Four times a day (QID) | INTRAMUSCULAR | Status: DC | PRN

## 2011-09-22 NOTE — Consult Note (Signed)
Marcus [name] 09/22/2011 Jansen Sciuto D Requesting Physician:  Dr. Tyson Alias    Reason for Consult:  resp distress, ESRD HPI: The patient is a 56 y.o. year-old AAM with hx of ESRD, DM2, HTN, CVAx2 and hypothyroidism presented to [facility] ED this am with SOB, HA, N/V.  Poor historian. Found to have BS 1215, pulm edema, INR 2.9 on coumadin (? Indication). Transferred to MICU at [facility] on Bipap, renal asked to see for HD, acute pulm edema. Pt poor historian, reports SOB this am, no other complaints. No recent fever, chills, prod cough. Says he doesn't miss HD sessions. Not sure if he's taking all his BP medication, he is not in charge (lives with sister and her friend who manage his meds).      Past Medical History:  Past Medical History  Diagnosis Date  . ESRD (end stage renal disease) on dialysis     uncertain cause/duration, prob DM  . Diabetes mellitus   . HTN (hypertension)   . Hypothyroidism   . Hyperlipidemia   . CVA (cerebrovascular accident)     hx CVA x 2  . GI bleed     2006, mallory-weiss tear  . Pulmonary edema     nov 2011, dry wt decreased  . DKA (diabetic ketoacidosis)     severe, Dec 2011  . Goiter     by CT Apr 2013  . Depression   . Cocaine abuse     past history  . Tobacco abuse     history of    Past Surgical History: No past surgical history on file.  Family History: No family history on file. Social History:  does not have a smoking history on file. He does not have any smokeless tobacco history on file. His alcohol and drug histories not on file.  Allergies: Allergies not on file  Home medications: Prior to Admission medications   Not on File    Inpatient medications:    . darbepoetin  100 mcg Intravenous Q Mon-HD  . heparin  2,400 Units Dialysis Once in dialysis  . heparin subcutaneous  5,000 Units Subcutaneous Q8H  . pantoprazole (PROTONIX) IV  40 mg Intravenous QHS  . paricalcitol  2 mcg Intravenous Q M,W,F-HD    Review of Systems Gen:   Denies headache, fever, chills, sweats.  No weight loss. HEENT:  No visual change, sore throat, difficulty swallowing. Resp:  As above.  Cardiac:  No chest pain.  Denies edema. GI:   Denies abdominal pain.   No nausea, vomiting, diarrhea.  No constipation. GU:  Denies difficulty or change in voiding.  No change in urine color.     MS:  Denies joint pain or swelling.   Derm:  Denies skin rash or itching.  No chronic skin conditions.  Neuro:   Denies focal weakness, memory problems, hx stroke or TIA.   Psych:  Denies symptoms of depression of anxiety.  No hallucination.    Labs: Basic Metabolic Panel: No results found for this basename: NA:7,K:7,CL:7,CO2:7,GLUCOSE:7,BUN:7,CREATININE:7,ALB:7,CALCIUM:7,PHOS:7 in the last 168 hours Liver Function Tests: No results found for this basename: AST:3,ALT:3,ALKPHOS:3,BILITOT:3,PROT:3,ALBUMIN:3 in the last 168 hours No results found for this basename: LIPASE:3,AMYLASE:3 in the last 168 hours No results found for this basename: AMMONIA:3 in the last 168 hours CBC:  Lab 09/22/11 1130  WBC 8.1  NEUTROABS --  HGB 9.7*  HCT 29.4*  MCV 88.8  PLT 265   PT/INR: @labrcntip (inr:5) Cardiac Enzymes: No results found for this basename: CKTOTAL:5,CKMB:5,CKMBINDEX:5,TROPONINI:5 in the last 168 hours  CBG: No results found for this basename: GLUCAP:5 in the last 168 hours  Iron Studies: No results found for this basename: IRON:30,TIBC:30,TRANSFERRIN:30,FERRITIN:30 in the last 168 hours  Xrays/Other Studies: Dg Chest Port 1 View  09/22/2011  *RADIOLOGY REPORT*  Clinical Data: Shortness of breath, pulmonary edema  PORTABLE CHEST - 1 VIEW  Comparison: Portable chest x-ray of 09/22/2011 and 09/09/2011  Findings: There does appear to be some increase in the degree of pulmonary edema which is primarily perihilar in location.  Small bilateral effusions are noted.  Moderate cardiomegaly is stable. Prominent soft tissues in the superior mediastinum are again noted  and on the prior CT of the neck, a large thyroid goiter was demonstrated.  IMPRESSION: Increase in pulmonary edema.  Small effusions.  Original Report Authenticated By: Juline Patch, M.D.    Physical Exam:  Blood pressure 156/66, pulse 70, resp. rate 15, SpO2 98.00%.  Gen: alert, chronically ill appearing, on bipap, not in distress, communicating effectively, poor memory Skin: no rash, cyanosis Neck: + JVD to angle of jaw, no bruits or LAN Chest: crackles L base, R clear Heart: regular, no rub or gallop +SEM faint Abdomen: soft, nontender, nondistended, no mass or HSM Ext: no sig edema, hypopigmented areas over shins bilat, good pulses in feet bilat Neuro: alert, Ox3, no focal deficit Heme/Lymph: no bruising or LAN  Outpatient HD: MWF at [facility]. 4 hrs 15 min. Hep 2400u. 2K/2.50 bath.  EDW 63.5kg. EPO 12,400   Zemplar 2 ug  IV Fe none mg  Access R FA AVGG   Impression/Plan 1. Resp distress due to pulm edema- acute HD today 2. ESRD / MWF at [facility]- about 5kg up, pulm edema > UF max tolerated today with HD in ICU.  3. Volume/HTN - vol overloaded, hold IV Cardene during HD 4. HTN crisis - agree with current mgmnt 5. Anemia of CKD- give darbepoeitin IV weekly 100ug with HD on Mondays. 6. Metabolic bone disease- PTH low 300's, cont Zemplar 2ug w HD, not on binder due to controlled phos (3-5).  7. DM with uncontrolled BS - on insulin gtt 8. Hx CVA x 2 in past 9. Hypothyroidism w goiter - was going to be referred for outpt workup recently  Will follow.  Marcus Moselle  MD [facility] [phone] pgr    [phone] cell 09/22/2011, 12:29 PM

## 2011-09-22 NOTE — Procedures (Signed)
Pt seen on HD Ap 120 VP 250  SBP 176.  Trying for 5 liters.  Pt awake and alert and appears comfortable on bipap

## 2011-09-22 NOTE — Progress Notes (Signed)
ANTICOAGULATION CONSULT NOTE - Initial Consult  Pharmacy Consult for Warfarin Indication: On prior to admission - has history of CVA  Allergies not on file  Vital Signs: BP: 156/66 mmHg (06/07 1200) Pulse Rate: 70  (06/07 1200)  Labs:  Basename 09/22/11 1130  HGB 9.7*  HCT 29.4*  PLT 265  APTT --  LABPROT 35.3*  INR 3.46*  HEPARINUNFRC --  CREATININE 7.59*  CKTOTAL 117  CKMB 3.8  TROPONINI <0.30    CrCl is unknown because there is no height on file for the current visit.   Medical History: Past Medical History  Diagnosis Date  . ESRD (end stage renal disease) on dialysis     uncertain cause/duration, prob DM  . Diabetes mellitus   . HTN (hypertension)   . Hypothyroidism   . Hyperlipidemia   . CVA (cerebrovascular accident)     hx CVA x 2  . GI bleed     2006, mallory-weiss tear  . Pulmonary edema     nov 2011, dry wt decreased  . DKA (diabetic ketoacidosis)     severe, Dec 2011  . Goiter     by CT Apr 2013  . Depression   . Cocaine abuse     past history  . Tobacco abuse     history of    Home warfarin dose reported to be 5mg  daily per patient records.  Patient unable to communicate currently  Assessment: 56 year old man with ESRD on warfarin prior to admission to continue on warfarin while hospitalized.  INR above goal of 2-3 today Goal of Therapy:  INR 2-3   Plan:  Will not give warfarin today as INR elevated.   Daily protimes.  Mickeal Skinner 09/22/2011,12:45 PM

## 2011-09-22 NOTE — H&P (Signed)
Name: Marcus [name] MRN: [id number] DOB: 15-Oct-1955    LOS: 0  Referring Provider:  [facility] Reason for Referral: HD  PULMONARY / CRITICAL CARE MEDICINE  HPI: 56 year old male with history of ESRD (M, W, F) presented to [facility] 6/7 with SOB, HA, and chest pain. Was found to be hypertensive (242/112) hyperglycemic (glucose 1215) and hyperosmolar (323). Patient transferred to [facility] for emergent HD.   Past Medical History  Diagnosis Date  . ESRD (end stage renal disease) on dialysis     uncertain cause/duration, prob DM  . Diabetes mellitus   . HTN (hypertension)   . Hypothyroidism   . Hyperlipidemia   . CVA (cerebrovascular accident)     hx CVA x 2  . GI bleed     2006, mallory-weiss tear  . Pulmonary edema     nov 2011, dry wt decreased  . DKA (diabetic ketoacidosis)     severe, Dec 2011  . Goiter     by CT Apr 2013  . Depression   . Cocaine abuse     past history  . Tobacco abuse     history of   No past surgical history on file. Prior to Admission medications   Not on File   Allergies Allergies  Allergen Reactions  . Penicillins     Family History No family history on file. Social History  does not have a smoking history on file. He does not have any smokeless tobacco history on file. His alcohol and drug histories not on file.  Review Of Systems:  Unable to obtain due to patients mental status  Brief patient description: 56 yo male with history of ESRD presented to [facility] 6/7 with SOB, HA, and chest pain (SBP 240's). Glucose >1000 and serum osmo 323. Evolving resp failure. Patient transferred to [facility] 6/7 for emergent HD.   Events Since Admission: Admit 6/7  Current Status: On Bipap, continues to be hypertensive   Awake and interacting but tangential  Vital Signs: Pulse Rate:  [68-70] 70  (06/07 1200) Resp:  [15-27] 15  (06/07 1200) BP: (156-212)/(66-95) 156/66 mmHg (06/07 1200) SpO2:  [97 %-100 %] 98 % (06/07 1200) FiO2  (%):  [40 %] 40 % (06/07 1100)  Intake/Output Summary (Last 24 hours) at 09/22/11 1432 Last data filed at 09/22/11 1232  Gross per 24 hour  Intake  36.88 ml  Output      0 ml  Net  36.88 ml    Physical Examination: General:  Ill appearing confused male Neuro: Confused, follows commands HEENT: dry MM Neck:  + JVD Cardiovascular: regular S1/S2 Lungs: bilateral crackles, tachypnea   Abdomen: Soft, NT  Musculoskeletal: lower leg edema Skin: no rash  Active Problems:  ESRD (end stage renal disease) on dialysis  Hypertensive emergency  Diabetic hyperosmolar non-ketotic state  Pulmonary edema  Hyponatremia   ASSESSMENT AND PLAN  PULMONARY  Lab 09/22/11 1111  PHART 7.346*  PCO2ART 53.4*  PO2ART 41.0*  HCO3 29.4*  O2SAT 74.0   Ventilator Settings: Vent Mode:  [-]  FiO2 (%):  [40 %] 40 % CXR:  Pulmonary edema, ? Right lower lobe effusion ETT:    A: Respiratory Distress in the setting of pulmonary edema- currently on Bipap P:   -Continue bipap -HD, hopefully to avoid intubation, planning for volume removal ~4kg -f/u CXR/ABG  CARDIOVASCULAR  Lab 09/22/11 1130  TROPONINI <0.30  LATICACIDVEN 2.8*  PROBNP >70000.0*    ECG: NSR, no acute findings  Lines:  R arm Fistula R fem CVL 6/7 [facility])>>>  A: Hypertensive Emergency (inital BP 242/112) P:  -Start Nicardipine drip Goal SBP 180, transition off NTG gtt -HD today  -tele  -trend BNP and CE -place a-line if not better post HD -consider TTE to eval diastolic, systolic fxn (read as normal on 05/04/08)  A: Chronic Anticoagulation- unknown reason P: -Coumadin per Pharm   RENAL  Lab 09/22/11 1130  NA 128*  K 4.1  CL 86*  CO2 29  BUN 50*  CREATININE 7.59*  CALCIUM 9.0  MG 2.3  PHOS 2.4   Intake/Output      06/06 0701 - 06/07 0700 06/07 0701 - 06/08 0700   I.V.  36.9   Total Intake  36.9   Net  +36.9         Foley: no  A:  ESRD P:   -Consult renal for HD asap   A: Hyponatremia    P:  -HD -Monitor BMP  GASTROINTESTINAL  Lab 09/22/11 1130  AST 32  ALT 19  ALKPHOS 92  BILITOT 0.4  PROT 8.5*  ALBUMIN 3.5    A:  No acute issues P:   -NPO for now  HEMATOLOGIC  Lab 09/22/11 1130  HGB 9.7*  HCT 29.4*  PLT 265  INR 3.46*  APTT --    A:  Mild Anemia- likely d/t ESRD P:  -no indication for transfusion -f/u CBC  INFECTIOUS  Lab 09/22/11 1130  WBC 8.1  PROCALCITON 3.73   Cultures: Urine Cx 6/7>>> BC X2 6/7>>>  Antibiotics:   A: no clear infection P:   -monitor for fever -trend PCT and lactic acid  -f/u CBC  ENDOCRINE  Lab 09/22/11 1355 09/22/11 1323 09/22/11 1230 09/22/11 1058  GLUCAP 522* 526* >600* >600*    A: Hyperosmolar/hyperglycemic state  P:   -check serum Ketones -insulin drip, monitor BMP frequently  -no need for aggressive hydration in hyperosmolar state due to ESRD -low threshold for CVL to monitor CVP   A: hypothyroidism P:  -convert home synthroid to IV  NEUROLOGIC  A: Altered Mental Status P:   -Supportive care -avoid benzos   BEST PRACTICE / DISPOSITION Level of Care:  ICU Primary Service: PCCM Consultants:  Renal Code Status:  full Diet: NPO DVT Px: Heparin SQ GI Px:  Protonix Skin Integrity: intact Social / Family: no family available   60 min CC time  Levy Pupa, MD, PhD 09/22/2011, 2:40 PM [facility] [phone] or if no answer [phone]

## 2011-09-22 NOTE — Progress Notes (Signed)
Nitro gtt that patient arrived on from outside hospital has been weaned off.

## 2011-09-23 ENCOUNTER — Inpatient Hospital Stay (HOSPITAL_COMMUNITY): Payer: Medicare Other

## 2011-09-23 LAB — CBC
Hemoglobin: 10.2 g/dL — ABNORMAL LOW (ref 13.0–17.0)
MCH: 29.4 pg (ref 26.0–34.0)
MCHC: 33 g/dL (ref 30.0–36.0)
MCV: 89 fL (ref 78.0–100.0)

## 2011-09-23 LAB — CARDIAC PANEL(CRET KIN+CKTOT+MB+TROPI): Total CK: 105 U/L (ref 7–232)

## 2011-09-23 LAB — BASIC METABOLIC PANEL
BUN: 16 mg/dL (ref 6–23)
BUN: 18 mg/dL (ref 6–23)
BUN: 20 mg/dL (ref 6–23)
CO2: 27 mEq/L (ref 19–32)
CO2: 28 mEq/L (ref 19–32)
Calcium: 8.8 mg/dL (ref 8.4–10.5)
Calcium: 8.8 mg/dL (ref 8.4–10.5)
Chloride: 96 mEq/L (ref 96–112)
Chloride: 95 mEq/L — ABNORMAL LOW (ref 96–112)
Creatinine, Ser: 4.45 mg/dL — ABNORMAL HIGH (ref 0.50–1.35)
Creatinine, Ser: 4.72 mg/dL — ABNORMAL HIGH (ref 0.50–1.35)
GFR calc Af Amer: 18 mL/min — ABNORMAL LOW (ref >90)
GFR calc non Af Amer: 13 mL/min — ABNORMAL LOW (ref >90)
Glucose, Bld: 148 mg/dL — ABNORMAL HIGH (ref 70–99)
Glucose, Bld: 153 mg/dL — ABNORMAL HIGH (ref 70–99)
Glucose, Bld: 169 mg/dL — ABNORMAL HIGH (ref 70–99)
Potassium: 3.5 mEq/L (ref 3.5–5.1)
Potassium: 4.6 mEq/L (ref 3.5–5.1)
Sodium: 136 mEq/L (ref 135–145)
Sodium: 135 mEq/L (ref 135–145)

## 2011-09-23 LAB — GLUCOSE, CAPILLARY
Glucose-Capillary: 82 mg/dL (ref 70–99)
Glucose-Capillary: 175 mg/dL — ABNORMAL HIGH (ref 70–99)
Glucose-Capillary: 343 mg/dL — ABNORMAL HIGH (ref 70–99)
Glucose-Capillary: 238 mg/dL — ABNORMAL HIGH (ref 70–99)

## 2011-09-23 MED ORDER — PANTOPRAZOLE SODIUM 40 MG PO TBEC
40.0000 mg | DELAYED_RELEASE_TABLET | Freq: Every day | ORAL | Status: DC
  Administered 2011-09-23 – 2011-09-25 (×3): 40 mg via ORAL
  Filled 2011-09-23 (×2): qty 1

## 2011-09-23 MED ORDER — RENA-VITE PO TABS
1.0000 | ORAL_TABLET | Freq: Every day | ORAL | Status: DC
  Administered 2011-09-23 – 2011-09-24 (×2): 1 via ORAL
  Filled 2011-09-23 (×4): qty 1

## 2011-09-23 MED ORDER — LATANOPROST 0.005 % OP SOLN
1.0000 [drp] | Freq: Every day | OPHTHALMIC | Status: DC
  Administered 2011-09-23 – 2011-09-24 (×2): 1 [drp] via OPHTHALMIC
  Filled 2011-09-23 (×2): qty 2.5

## 2011-09-23 MED ORDER — METOPROLOL TARTRATE 50 MG PO TABS
75.0000 mg | ORAL_TABLET | Freq: Two times a day (BID) | ORAL | Status: DC
  Administered 2011-09-23 – 2011-09-25 (×5): 75 mg via ORAL
  Filled 2011-09-23 (×6): qty 1

## 2011-09-23 MED ORDER — TEMAZEPAM 15 MG PO CAPS
15.0000 mg | ORAL_CAPSULE | Freq: Every day | ORAL | Status: DC
  Administered 2011-09-23 – 2011-09-24 (×2): 15 mg via ORAL
  Filled 2011-09-23 (×2): qty 1

## 2011-09-23 MED ORDER — VITAMIN D3 25 MCG (1000 UNIT) PO TABS
2000.0000 [IU] | ORAL_TABLET | Freq: Every day | ORAL | Status: DC
  Administered 2011-09-23 – 2011-09-25 (×3): 2000 [IU] via ORAL
  Filled 2011-09-23 (×3): qty 2

## 2011-09-23 MED ORDER — INSULIN GLARGINE 100 UNIT/ML ~~LOC~~ SOLN
5.0000 [IU] | Freq: Every day | SUBCUTANEOUS | Status: DC
  Administered 2011-09-23 – 2011-09-24 (×2): 5 [IU] via SUBCUTANEOUS

## 2011-09-23 MED ORDER — SIMVASTATIN 5 MG PO TABS
5.0000 mg | ORAL_TABLET | Freq: Every day | ORAL | Status: DC
  Administered 2011-09-24 – 2011-09-25 (×2): 5 mg via ORAL
  Filled 2011-09-23 (×4): qty 1

## 2011-09-23 MED ORDER — INSULIN ASPART 100 UNIT/ML ~~LOC~~ SOLN
0.0000 [IU] | Freq: Three times a day (TID) | SUBCUTANEOUS | Status: DC
Start: 2011-09-24 — End: 2011-09-25
  Administered 2011-09-24: 8 [IU] via SUBCUTANEOUS
  Administered 2011-09-24: 11 [IU] via SUBCUTANEOUS
  Administered 2011-09-25: 3 [IU] via SUBCUTANEOUS
  Administered 2011-09-25: 15 [IU] via SUBCUTANEOUS

## 2011-09-23 MED ORDER — LEVOTHYROXINE SODIUM 125 MCG PO TABS
250.0000 ug | ORAL_TABLET | Freq: Every day | ORAL | Status: DC
  Administered 2011-09-24 – 2011-09-25 (×2): 250 ug via ORAL
  Filled 2011-09-23 (×3): qty 2

## 2011-09-23 MED ORDER — INSULIN ASPART 100 UNIT/ML ~~LOC~~ SOLN
0.0000 [IU] | SUBCUTANEOUS | Status: DC
  Administered 2011-09-23: 3 [IU] via SUBCUTANEOUS

## 2011-09-23 MED ORDER — DOCUSATE SODIUM 100 MG PO CAPS
200.0000 mg | ORAL_CAPSULE | Freq: Every day | ORAL | Status: DC
  Administered 2011-09-23 – 2011-09-25 (×3): 200 mg via ORAL
  Filled 2011-09-23 (×3): qty 2

## 2011-09-23 MED ORDER — FLUOXETINE HCL 10 MG PO TABS: 5.0000 mg | ORAL_TABLET | Freq: Every day | ORAL | Status: DC

## 2011-09-23 MED ORDER — FLUOXETINE HCL 20 MG/5ML PO SOLN
5.0000 mg | Freq: Every day | ORAL | Status: DC
  Administered 2011-09-23 – 2011-09-24 (×2): 5 mg via ORAL
  Filled 2011-09-23 (×3): qty 5

## 2011-09-23 MED ORDER — AMLODIPINE BESYLATE 10 MG PO TABS
10.0000 mg | ORAL_TABLET | Freq: Every day | ORAL | Status: DC
  Administered 2011-09-23 – 2011-09-24 (×2): 10 mg via ORAL
  Filled 2011-09-23 (×3): qty 1

## 2011-09-23 MED ORDER — CLONIDINE HCL 0.1 MG PO TABS
0.1000 mg | ORAL_TABLET | Freq: Three times a day (TID) | ORAL | Status: DC
  Administered 2011-09-23 (×3): 0.1 mg via ORAL
  Filled 2011-09-23 (×6): qty 1

## 2011-09-23 NOTE — Progress Notes (Signed)
Name: Marcus [name] MRN: [id number] DOB: [birth year]    LOS: 1  Referring Provider:  [facility] Reason for Referral: HD  PULMONARY / CRITICAL CARE MEDICINE  HPI: 56 year old male with history of ESRD (M, W, F) presented to [facility] 6/7 with SOB, HA, and chest pain. Was found to be hypertensive (242/112) hyperglycemic (glucose 1215) and hyperosmolar (323). Patient transferred to [facility] for emergent HD.    Current Status: Off bipap.  No c/o.  Wants to eat.    Vital Signs: Temp:  [97.6 F (36.4 C)-99.5 F (37.5 C)] 98.5 F (36.9 C) (06/08 1200) Pulse Rate:  [65-90] 70  (06/08 1300) Resp:  [10-22] 13  (06/08 1300) BP: (120-220)/(40-157) 136/64 mmHg (06/08 1300) SpO2:  [90 %-100 %] 98 % (06/08 1300) FiO2 (%):  [40 %] 40 % (06/07 1815) Weight:  [140 lb 3.4 oz (63.6 kg)-150 lb 2.1 oz (68.1 kg)] 140 lb 3.4 oz (63.6 kg) (06/08 0630)  Intake/Output Summary (Last 24 hours) at 09/23/11 1359 Last data filed at 09/23/11 1300  Gross per 24 hour  Intake 1472.17 ml  Output   4000 ml  Net -2527.83 ml    Physical Examination: General:  Chronically ill appearing male, pleasant, NAD  Neuro: awake and alert, MAE  HEENT: dry MM Neck:  + JVD Cardiovascular: regular S1/S2 Lungs: resps even non labored on [place], diminished bases, few scattered crackles  Abdomen: Soft, NT  Musculoskeletal: lower leg edema Skin: no rash  Active Problems:  ESRD (end stage renal disease) on dialysis  Hypertensive emergency  Diabetic hyperosmolar non-ketotic state  Pulmonary edema  Hyponatremia   ASSESSMENT AND PLAN  PULMONARY  Lab 09/22/11 1111  PHART 7.346*  PCO2ART 53.4*  PO2ART 41.0*  HCO3 29.4*  O2SAT 74.0   CXR:  6/8 small bilat effusion, slight improved aeration  ETT:   None  A: Respiratory Distress in the setting of pulmonary edema- Much improved with HD.  Now off Bipap  P:   -pulmonary hygiene  -f/u CXR -d/c bipap   CARDIOVASCULAR  Lab 09/23/11 0247 09/22/11 2100  09/22/11 1130  TROPONINI <0.30 <0.30 <0.30  LATICACIDVEN -- -- 2.8*  PROBNP -- -- >70000.0*    ECG: NSR, no acute findings  Lines:  R arm Fistula R fem CVL 6/7 [facility])>>>  A: Hypertensive Emergency (inital BP 242/112) P:  -Cont cardene gtt - Goal SBP 180 -HD per renal (MWF)  -tele  -trend BNP and CE -consider TTE to eval diastolic, systolic fxn (read as normal on 05/04/08)  A: Chronic Anticoagulation- unknown reason P: -Coumadin per Pharm   RENAL  Lab 09/23/11 1200 09/23/11 0800 09/23/11 0400 09/23/11 09/22/11 2100 09/22/11 1130  NA 133* 135 138 136 136 --  K 4.6 4.0 -- -- -- --  CL 95* 95* 97 96 94* --  CO2 28 27 27 28 29  --  BUN 21 20 18 16 14  --  CREATININE 4.98* 4.72* 4.45* 4.04* 3.40* --  CALCIUM 8.5 8.8 8.8 8.7 8.7 --  MG -- -- -- -- -- 2.3  PHOS -- -- -- -- -- 2.4   Intake/Output      06/07 0701 - 06/08 0700 06/08 0701 - 06/09 0700   I.V. (mL/kg) 1140.1 (17.9) 424 (6.7)   Total Intake(mL/kg) 1140.1 (17.9) 424 (6.7)   Other 4000    Total Output 4000    Net -2859.9 +424         Foley: no  A:  ESRD P:   -HD  per renal    A: Hyponatremia  P:  -Monitor BMP  GASTROINTESTINAL  Lab 09/22/11 1130  AST 32  ALT 19  ALKPHOS 92  BILITOT 0.4  PROT 8.5*  ALBUMIN 3.5    A:  No acute issues P:   -start diet   HEMATOLOGIC  Lab 09/23/11 0400 09/22/11 1130  HGB 10.2* 9.7*  HCT 30.9* 29.4*  PLT 228 265  INR -- 3.46*  APTT -- --    A:  Mild Anemia- likely d/t ESRD P:  -no indication for transfusion -f/u CBC  INFECTIOUS  Lab 09/23/11 0400 09/22/11 1130  WBC 8.7 8.1  PROCALCITON -- 3.73   Cultures: Urine Cx 6/7>>> BC X2 6/7>>>  Antibiotics: none  A: no clear infection P:   -monitor for fever -trend PCT and lactic acid  -f/u CBC  ENDOCRINE  Lab 09/23/11 0704 09/22/11 2315 09/22/11 2121 09/22/11 1902 09/22/11 1603  GLUCAP 238* 343* 175* 82 216*    A: Hyperosmolar/hyperglycemic state - no gap, serum ketones neg  P:     -d/c insulin gtt -add lantus, transition to SSI  -diet - watch sugars   A: hypothyroidism P:  -convert home synthroid to IV  NEUROLOGIC  A: Altered Mental Status - resolved.  P:   -Supportive care -avoid benzos  -resume home prozac  BEST PRACTICE / DISPOSITION Level of Care:  ICU Primary Service: PCCM Consultants:  Renal Code Status:  full Diet: NPO DVT Px: Heparin SQ GI Px:  Protonix Skin Integrity: intact Social / Family: no family available   WHITEHEART,KATHRYN, NP 09/23/2011  2:00 PM Pager: [phone]  *Care during the described time interval was provided by me and/or other providers on the critical care team. I have reviewed this patient's available data, including medical history, events of note, physical examination and test results as part of my evaluation.  Patient seen and examined, agree with above note.  I dictated the care and orders written for this patient under my direction.  Koren Bound, M.D. [phone]

## 2011-09-23 NOTE — Progress Notes (Signed)
Subjective:  Much better, came off bipap shortly after HD yesterday. No sob, no complaints.   Objective:    Vital signs in last 24 hours: Filed Vitals:   09/23/11 0600 09/23/11 0615 09/23/11 0630 09/23/11 0645  BP: 162/71 144/65  142/65  Pulse: 90 88 87 82  Temp:      TempSrc:      Resp: 18 15 16 21   Weight:   63.6 kg (140 lb 3.4 oz)   SpO2: 90% 98% 99% 98%   Weight change:   Intake/Output Summary (Last 24 hours) at 09/23/11 0725 Last data filed at 09/23/11 0700  Gross per 24 hour  Intake 1143.69 ml  Output   4000 ml  Net -2856.31 ml   Labs: Basic Metabolic Panel:  Lab 09/23/11 0981 09/23/11 09/22/11 2100 09/22/11 1535 09/22/11 1130  NA 138 136 136 131* 128*  K 4.3 3.5 4.2 3.7 4.1  CL 97 96 94* 89* 86*  CO2 27 28 29 27 29   GLUCOSE 148* 199* 209* 416* 775*  BUN 18 16 14  50* 50*  CREATININE 4.45* 4.04* 3.40* 7.64* 7.59*  ALB -- -- -- -- --  CALCIUM 8.8 8.7 8.7 9.1 9.0  PHOS -- -- -- -- 2.4   Liver Function Tests:  Lab 09/22/11 1130  AST 32  ALT 19  ALKPHOS 92  BILITOT 0.4  PROT 8.5*  ALBUMIN 3.5   No results found for this basename: LIPASE:3,AMYLASE:3 in the last 168 hours No results found for this basename: AMMONIA:3 in the last 168 hours CBC:  Lab 09/23/11 0400 09/22/11 1130  WBC 8.7 8.1  NEUTROABS -- --  HGB 10.2* 9.7*  HCT 30.9* 29.4*  MCV 89.0 88.8  PLT 228 265   Cardiac Enzymes:  Lab 09/23/11 0247 09/22/11 2100 09/22/11 1130  CKTOTAL 105 115 117  CKMB 3.1 3.4 3.8  CKMBINDEX -- -- --  TROPONINI <0.30 <0.30 <0.30   CBG:  Lab 09/23/11 0704 09/22/11 2315 09/22/11 2121 09/22/11 1902 09/22/11 1603  GLUCAP 238* 343* 175* 82 216*    Iron Studies: No results found for this basename: IRON:30,TIBC:30,SATURATION RATIOS:30,TRANSFERRIN:30,FERRITIN:30 in the last 168 hours  Physical Exam:  Blood pressure 142/65, pulse 82, temperature 98.7 F (37.1 C), temperature source Oral, resp. rate 21, weight 63.6 kg (140 lb 3.4 oz), SpO2 98.00%.  Gen: alert,  chronically ill, off of bipap, comfortable Skin: no rash, cyanosis  Neck: no JVD, no bruits or LAN  Chest: cleat bilat no rales Heart: regular, no rub or gallop +SEM faint  Abdomen: soft, nontender, nondistended, no mass or HSM  Ext: no sig edema, hypopigmented areas over shins bilat, good pulses in feet bilat  Neuro: alert, Ox3, no focal deficit  Heme/Lymph: no bruising or LAN   Outpatient HD: MWF at [facility]. 4 hrs 15 min. Hep 2400u. 2K/2.50 bath. EDW 63.5kg. EPO 12,400 Zemplar 2 ug IV Fe none mg Access R FA AVGG    Impression/Plan  1. Resp distress due to pulm edema- resolved with HD yest.  He is right at his dry weight.  2. ESRD / MWF at [facility]- see above 3. Volume/HTN - resume outpt BP meds by mouth (clonidine 0.1 tid, amlodipine 10/d and metoprolol tartrate 75 bid) 4. Anemia of CKD- give darbepoeitin IV weekly 100ug with HD on Mondays. 5. Metabolic bone disease- PTH low 300's, cont Zemplar 2ug w HD, not on binder due to controlled phos (3-5).  6. DM with uncontrolled BS - on insulin gtt 7. Hx CVA x 2  in past 8. Hypothyroidism w goiter - was going to be referred for outpt workup recently 9. Dispo- per primary service, OK for d/c from renal standpoint.    [name]  MD [employer] [phone] pgr    [phone] cell 09/23/2011, 7:25 AM

## 2011-09-23 NOTE — Progress Notes (Signed)
ANTICOAGULATION CONSULT NOTE - Initial Consult  Pharmacy Consult for Warfarin Indication: On prior to admission - has history of CVA  Allergies  Allergen Reactions  . Penicillins     Vital Signs: Temp: 98.5 F (36.9 C) (06/08 1200) Temp src: Oral (06/08 1200) BP: 136/64 mmHg (06/08 1300) Pulse Rate: 70  (06/08 1300)  Labs:  Basename 09/23/11 1430 09/23/11 1200 09/23/11 0800 09/23/11 0400 09/23/11 0247 09/22/11 2100 09/22/11 1130  HGB -- -- -- 10.2* -- -- 9.7*  HCT -- -- -- 30.9* -- -- 29.4*  PLT -- -- -- 228 -- -- 265  APTT -- -- -- -- -- -- --  LABPROT 38.7* -- -- -- -- -- 35.3*  INR 3.89* -- -- -- -- -- 3.46*  HEPARINUNFRC -- -- -- -- -- -- --  CREATININE -- 4.98* 4.72* 4.45* -- -- --  CKTOTAL -- -- -- -- 105 115 117  CKMB -- -- -- -- 3.1 3.4 3.8  TROPONINI -- -- -- -- <0.30 <0.30 <0.30    CrCl is unknown because there is no height on file for the current visit.   Medical History: Past Medical History  Diagnosis Date  . ESRD (end stage renal disease) on dialysis     uncertain cause/duration, prob DM  . Diabetes mellitus   . HTN (hypertension)   . Hypothyroidism   . Hyperlipidemia   . CVA (cerebrovascular accident)     hx CVA x 2  . GI bleed     2006, mallory-weiss tear  . Pulmonary edema     nov 2011, dry wt decreased  . DKA (diabetic ketoacidosis)     severe, Dec 2011  . Goiter     by CT Apr 2013  . Depression   . Cocaine abuse     past history  . Tobacco abuse     history of    Home warfarin dose reported to be 5mg  daily per patient records.    Assessment: 56 year old man with ESRD on warfarin prior to admission to continue on warfarin while hospitalized.  INR above goal of 2-3 today Goal of Therapy:  INR 2-3   Plan:  Will not give warfarin today as INR elevated.   Daily protimes.  Mickeal Skinner 09/23/2011,3:30 PM

## 2011-09-24 LAB — BASIC METABOLIC PANEL
BUN: 33 mg/dL — ABNORMAL HIGH (ref 6–23)
Creatinine, Ser: 6.31 mg/dL — ABNORMAL HIGH (ref 0.50–1.35)
GFR calc Af Amer: 10 mL/min — ABNORMAL LOW (ref >90)
GFR calc non Af Amer: 9 mL/min — ABNORMAL LOW (ref >90)
Glucose, Bld: 246 mg/dL — ABNORMAL HIGH (ref 70–99)

## 2011-09-24 LAB — CBC
HCT: 29.3 % — ABNORMAL LOW (ref 39.0–52.0)
Hemoglobin: 9.4 g/dL — ABNORMAL LOW (ref 13.0–17.0)
MCHC: 32.1 g/dL (ref 30.0–36.0)
MCV: 91.3 fL (ref 78.0–100.0)
RDW: 15.5 % (ref 11.5–15.5)

## 2011-09-24 LAB — GLUCOSE, CAPILLARY
Glucose-Capillary: 104 mg/dL — ABNORMAL HIGH (ref 70–99)
Glucose-Capillary: 227 mg/dL — ABNORMAL HIGH (ref 70–99)
Glucose-Capillary: 187 mg/dL — ABNORMAL HIGH (ref 70–99)
Glucose-Capillary: 328 mg/dL — ABNORMAL HIGH (ref 70–99)
Glucose-Capillary: 65 mg/dL — ABNORMAL LOW (ref 70–99)
Glucose-Capillary: 80 mg/dL (ref 70–99)
Glucose-Capillary: 142 mg/dL — ABNORMAL HIGH (ref 70–99)

## 2011-09-24 MED ORDER — HYDRALAZINE HCL 20 MG/ML IJ SOLN
10.0000 mg | INTRAMUSCULAR | Status: DC | PRN
  Administered 2011-09-24: 10 mg via INTRAVENOUS
  Filled 2011-09-24 (×2): qty 0.5

## 2011-09-24 MED ORDER — WARFARIN SODIUM 5 MG PO TABS
5.0000 mg | ORAL_TABLET | Freq: Once | ORAL | Status: AC
  Administered 2011-09-24: 5 mg via ORAL
  Filled 2011-09-24 (×2): qty 1

## 2011-09-24 MED ORDER — CLONIDINE HCL 0.2 MG PO TABS
0.2000 mg | ORAL_TABLET | Freq: Three times a day (TID) | ORAL | Status: DC
  Administered 2011-09-24: 0.2 mg via ORAL
  Filled 2011-09-24 (×3): qty 1

## 2011-09-24 MED ORDER — HEPARIN SODIUM (PORCINE) 1000 UNIT/ML DIALYSIS: 2400.0000 [IU] | INTRAMUSCULAR | Status: DC | PRN

## 2011-09-24 MED ORDER — CLONIDINE HCL 0.3 MG PO TABS
0.3000 mg | ORAL_TABLET | Freq: Three times a day (TID) | ORAL | Status: DC
  Administered 2011-09-24 – 2011-09-25 (×4): 0.3 mg via ORAL
  Filled 2011-09-24 (×5): qty 1

## 2011-09-24 MED ORDER — INSULIN GLARGINE 100 UNIT/ML ~~LOC~~ SOLN
10.0000 [IU] | Freq: Every day | SUBCUTANEOUS | Status: DC
  Administered 2011-09-25: 10 [IU] via SUBCUTANEOUS

## 2011-09-24 NOTE — Progress Notes (Signed)
Subjective:  Doing well, f/u CXR after HD showed clearly of edema. No complaints, still in ICU (?no stepdown beds most likely).   Objective:    Vital signs in last 24 hours: Filed Vitals:   09/24/11 0645 09/24/11 0700 09/24/11 0758 09/24/11 0828  BP: 180/79 184/78 215/120   Pulse: 65 66    Temp:    98.6 F (37 C)  TempSrc:      Resp: 12 9    Weight:      SpO2: 92% 92%     Weight change: -3.3 kg (-7 lb 4.4 oz)  Intake/Output Summary (Last 24 hours) at 09/24/11 1046 Last data filed at 09/24/11 0700  Gross per 24 hour  Intake 1539.2 ml  Output      0 ml  Net 1539.2 ml   Labs: Basic Metabolic Panel:  Lab 09/24/11 1610 09/23/11 1200 09/23/11 0800 09/23/11 0400 09/23/11 09/22/11 2100 09/22/11 1535 09/22/11 1130  NA 132* 133* 135 138 136 136 131* --  K 4.7 4.6 4.0 4.3 3.5 4.2 3.7 --  CL 95* 95* 95* 97 96 94* 89* --  CO2 25 28 27 27 28 29 27  --  GLUCOSE 246* 169* 153* 148* 199* 209* 416* --  BUN 33* 21 20 18 16 14  50* --  CREATININE 6.31* 4.98* 4.72* 4.45* 4.04* 3.40* 7.64* --  ALB -- -- -- -- -- -- -- --  CALCIUM 8.7 8.5 8.8 8.8 8.7 8.7 9.1 --  PHOS 4.6 -- -- -- -- -- -- 2.4   Liver Function Tests:  Lab 09/22/11 1130  AST 32  ALT 19  ALKPHOS 92  BILITOT 0.4  PROT 8.5*  ALBUMIN 3.5   No results found for this basename: LIPASE:3,AMYLASE:3 in the last 168 hours No results found for this basename: AMMONIA:3 in the last 168 hours CBC:  Lab 09/24/11 0443 09/23/11 0400 09/22/11 1130  WBC 7.4 8.7 8.1  NEUTROABS -- -- --  HGB 9.4* 10.2* 9.7*  HCT 29.3* 30.9* 29.4*  MCV 91.3 89.0 88.8  PLT 224 228 265   Cardiac Enzymes:  Lab 09/23/11 0247 09/22/11 2100 09/22/11 1130  CKTOTAL 105 115 117  CKMB 3.1 3.4 3.8  CKMBINDEX -- -- --  TROPONINI <0.30 <0.30 <0.30   CBG:  Lab 09/24/11 0829 09/23/11 1553 09/23/11 1314 09/23/11 1122 09/23/11 1018  GLUCAP 328* 187* 227* 140* 104*    Iron Studies: No results found for this basename: IRON:30,TIBC:30,SATURATION  RATIOS:30,TRANSFERRIN:30,FERRITIN:30 in the last 168 hours  Physical Exam:  Blood pressure 215/120, pulse 66, temperature 98.6 F (37 C), temperature source Oral, resp. rate 9, weight 64.8 kg (142 lb 13.7 oz), SpO2 92.00%.  Gen: alert, chronically ill, off of bipap, comfortable Skin: no rash, cyanosis  Neck: no JVD, no bruits or LAN  Chest: cleat bilat no rales Heart: regular, no rub or gallop +SEM faint  Abdomen: soft, nontender, nondistended, no mass or HSM  Ext: no sig edema, hypopigmented areas over shins bilat, good pulses in feet bilat  Neuro: alert, Ox3, no focal deficit  Heme/Lymph: no bruising or LAN   Outpatient HD: MWF at [facility] 4 hrs 15 min. Hep 2400u. 2K/2.50 bath. EDW 63.5kg. EPO 12,400 Zemplar 2 ug IV Fe none mg Access R FA AVGG    Impression/Plan  1. Resp distress due to pulm edema- resolved with HD yest.  2. ESRD / MWF at [facility]- see above. He is 1-2 kg over dry weight today. OK for d/c from renal standpoint. Will order HD tomorrow  in case he is still here.    3. Volume/HTN - resume outpt BP meds by mouth (clonidine 0.1 tid, amlodipine 10/d and metoprolol tartrate 75 bid). 4. Anemia of CKD- give darbepoeitin IV weekly 100ug with HD on Mondays. 5. Metabolic bone disease- PTH low 300's, cont Zemplar 2ug w HD, not on binder due to controlled phos (3-5).  6. DM with uncontrolled BS - on insulin gtt 7. Hx CVA x 2 in past 8. Hypothyroidism w goiter - was going to be referred for outpt workup recently  Marcus Moselle  MD [facility] [phone] pgr    [phone] cell 09/24/2011, 10:46 AM

## 2011-09-24 NOTE — Progress Notes (Signed)
Patient transferred to [id number] via wheelchair, no acute distress noted.  o2 1L n/c, skin warm and dry and resp. Even and regular. Call light in reach, bed in low position.

## 2011-09-24 NOTE — Progress Notes (Signed)
ANTICOAGULATION CONSULT NOTE - Initial Consult  Pharmacy Consult for Warfarin Indication: On prior to admission - has history of CVA  Allergies  Allergen Reactions  . Penicillins     Vital Signs: Temp: 99.4 F (37.4 C) (06/09 1230) Temp src: Oral (06/09 0400) BP: 170/77 mmHg (06/09 1100) Pulse Rate: 59  (06/09 1100)  Labs:  Basename 09/24/11 0443 09/23/11 1430 09/23/11 1200 09/23/11 0800 09/23/11 0400 09/23/11 0247 09/22/11 2100 09/22/11 1130  HGB 9.4* -- -- -- 10.2* -- -- --  HCT 29.3* -- -- -- 30.9* -- -- 29.4*  PLT 224 -- -- -- 228 -- -- 265  APTT -- -- -- -- -- -- -- --  LABPROT 30.2* 38.7* -- -- -- -- -- 35.3*  INR 2.83* 3.89* -- -- -- -- -- 3.46*  HEPARINUNFRC -- -- -- -- -- -- -- --  CREATININE 6.31* -- 4.98* 4.72* -- -- -- --  CKTOTAL -- -- -- -- -- 105 115 117  CKMB -- -- -- -- -- 3.1 3.4 3.8  TROPONINI -- -- -- -- -- <0.30 <0.30 <0.30    CrCl is unknown because there is no height on file for the current visit.   Home warfarin dose reported to be 5mg  daily per patient records.    Assessment: 56 year old man with ESRD on warfarin prior to admission to continue on warfarin while hospitalized.  INR 2.83 today which is at goal Goal of Therapy:  INR 2-3   Plan:  Warfarin 5mg  x 1 dose today. Daily protimes.  Mickeal Skinner 09/24/2011,2:04 PM

## 2011-09-24 NOTE — Progress Notes (Signed)
Name: Marcus [name] MRN: [id number] DOB: [birth year]    LOS: 2  Referring Provider:  [facility] Reason for Referral: HD  PULMONARY / CRITICAL CARE MEDICINE  HPI: 56 year old male with history of ESRD (M, W, F) presented to [facility] 6/7 with SOB, HA, and chest pain. Was found to be hypertensive (242/112) hyperglycemic (glucose 1215) and hyperosmolar (323). Patient transferred to [facility] for emergent HD.    Current Status: Remains off bipap. Off insulin gtt. No c/o.    Vital Signs: Temp:  [97.4 F (36.3 C)-99.4 F (37.4 C)] 99.4 F (37.4 C) (06/09 1230) Pulse Rate:  [57-75] 59  (06/09 1100) Resp:  [9-26] 17  (06/09 1100) BP: (128-215)/(51-120) 170/77 mmHg (06/09 1100) SpO2:  [91 %-100 %] 100 % (06/09 1100) Weight:  [142 lb 13.7 oz (64.8 kg)] 142 lb 13.7 oz (64.8 kg) (06/09 0600)  Intake/Output Summary (Last 24 hours) at 09/24/11 1326 Last data filed at 09/24/11 1100  Gross per 24 hour  Intake 1767.5 ml  Output      0 ml  Net 1767.5 ml    Physical Examination: General:  Chronically ill appearing male, pleasant, NAD  Neuro: awake and alert, MAE  HEENT: dry MM Neck:  + JVD Cardiovascular: regular S1/S2 Lungs: resps even non labored on [place], diminished bases, few scattered crackles  Abdomen: Soft, NT  Musculoskeletal: lower leg edema Skin: no rash  Active Problems:  ESRD (end stage renal disease) on dialysis  Hypertensive emergency  Diabetic hyperosmolar non-ketotic state  Pulmonary edema  Hyponatremia   ASSESSMENT AND PLAN  PULMONARY  Lab 09/22/11 1111  PHART 7.346*  PCO2ART 53.4*  PO2ART 41.0*  HCO3 29.4*  O2SAT 74.0   CXR:  6/8 small bilat effusion, slight improved aeration  ETT:   None  A: Respiratory Distress in the setting of pulmonary edema- Much improved with HD.  Now off Bipap  P:   -pulmonary hygiene  -f/u CXR -wean O2 to off    CARDIOVASCULAR  Lab 09/23/11 0247 09/22/11 2100 09/22/11 1130  TROPONINI <0.30 <0.30 <0.30    LATICACIDVEN -- -- 2.8*  PROBNP -- -- >70000.0*    ECG: NSR, no acute findings  Lines:  R arm Fistula R fem CVL 6/7 [facility])>>>  A: Hypertensive Emergency (inital BP 242/112) P:  -d/c cardene gtt  -resume outpt rx  -HD per renal (MWF)  -increase clonidine to 0.3mg  TID 6/9  A: Chronic Anticoagulation- unknown reason P: -Coumadin per Pharm   RENAL  Lab 09/24/11 0443 09/23/11 1200 09/23/11 0800 09/23/11 0400 09/23/11 09/22/11 1130  NA 132* 133* 135 138 136 --  K 4.7 4.6 -- -- -- --  CL 95* 95* 95* 97 96 --  CO2 25 28 27 27 28  --  BUN 33* 21 20 18 16  --  CREATININE 6.31* 4.98* 4.72* 4.45* 4.04* --  CALCIUM 8.7 8.5 8.8 8.8 8.7 --  MG 1.9 -- -- -- -- 2.3  PHOS 4.6 -- -- -- -- 2.4   Intake/Output      06/08 0701 - 06/09 0700 06/09 0701 - 06/10 0700   P.O. 600 360   I.V. (mL/kg) 1151.5 (17.8) 80 (1.2)   Total Intake(mL/kg) 1751.5 (27) 440 (6.8)   Other     Total Output     Net +1751.5 +440         Foley: no  A:  ESRD P:   -HD per renal    A: Hyponatremia  P:  -Monitor BMP  GASTROINTESTINAL  Lab 09/22/11 1130  AST 32  ALT 19  ALKPHOS 92  BILITOT 0.4  PROT 8.5*  ALBUMIN 3.5    A:  No acute issues P:   -tol diet   HEMATOLOGIC  Lab 09/24/11 0443 09/23/11 1430 09/23/11 0400 09/22/11 1130  HGB 9.4* -- 10.2* 9.7*  HCT 29.3* -- 30.9* 29.4*  PLT 224 -- 228 265  INR 2.83* 3.89* -- 3.46*  APTT -- -- -- --    A:  Mild Anemia- likely d/t ESRD P:  -no indication for transfusion -f/u CBC  INFECTIOUS  Lab 09/24/11 0443 09/23/11 0400 09/22/11 1130  WBC 7.4 8.7 8.1  PROCALCITON -- -- 3.73   Cultures: Urine Cx 6/7>>> BC X2 6/7>>>  Antibiotics: none  A: no clear infection P:   -monitor for fever -trend PCT and lactic acid  -f/u CBC  ENDOCRINE  Lab 09/24/11 1226 09/24/11 0829 09/23/11 1553 09/23/11 1314 09/23/11 1122  GLUCAP 285* 328* 187* 227* 140*    A: Hyperosmolar/hyperglycemic state - no gap, serum ketones neg  P:    -d/c insulin gtt -increase lantus 6/9 -Cont SSI   A: hypothyroidism P:  -cont home synthroid - was previously being w/u for goiter - f/u with PCP  NEUROLOGIC  A: Altered Mental Status - resolved.  P:   -Supportive care -avoid benzos  -resume home prozac  BEST PRACTICE / DISPOSITION Level of Care:  Med surg  Primary Service: PCCM Consultants:  Renal Code Status:  full Diet: NPO DVT Px: Heparin SQ GI Px:  Protonix Skin Integrity: intact Social / Family: no family available   Will tx to floor 6/9 and likely d/c home in am if able to wean off O2, control BP and ambulate in hall.   Danford Bad, NP 09/24/2011  1:26 PM Pager: [phone]) [phone]  *Care during the described time interval was provided by me and/or other providers on the critical care team. I have reviewed this patient's available data, including medical history, events of note, physical examination and test results as part of my evaluation.  Will transfer to tele, patient likely to discharge in AM so will keep on PCCM service.  Patient seen and examined, agree with above note.  I dictated the care and orders written for this patient under my direction.  Koren Bound, M.D. [phone]

## 2011-09-25 ENCOUNTER — Inpatient Hospital Stay (HOSPITAL_COMMUNITY): Payer: Medicare Other

## 2011-09-25 LAB — PROTIME-INR
INR: 1.63 — ABNORMAL HIGH (ref 0.00–1.49)
Prothrombin Time: 19.6 seconds — ABNORMAL HIGH (ref 11.6–15.2)

## 2011-09-25 LAB — GLUCOSE, CAPILLARY: Glucose-Capillary: 190 mg/dL — ABNORMAL HIGH (ref 70–99)

## 2011-09-25 MED ORDER — WARFARIN SODIUM 7.5 MG PO TABS
7.5000 mg | ORAL_TABLET | Freq: Once | ORAL | Status: AC
  Administered 2011-09-25: 7.5 mg via ORAL
  Filled 2011-09-25: qty 1

## 2011-09-25 MED ORDER — WARFARIN SODIUM 5 MG PO TABS: ORAL_TABLET | ORAL | Status: DC

## 2011-09-25 MED ORDER — CLONIDINE HCL 0.1 MG PO TABS: 0.3000 mg | ORAL_TABLET | Freq: Three times a day (TID) | ORAL | Status: DC

## 2011-09-25 MED ORDER — DARBEPOETIN ALFA-POLYSORBATE 100 MCG/0.5ML IJ SOLN
INTRAMUSCULAR | Status: AC
  Administered 2011-09-25: 100 ug via INTRAVENOUS
  Filled 2011-09-25: qty 0.5

## 2011-09-25 MED ORDER — PARICALCITOL 5 MCG/ML IV SOLN
INTRAVENOUS | Status: AC
  Administered 2011-09-25: 2 ug via INTRAVENOUS
  Filled 2011-09-25: qty 1

## 2011-09-25 NOTE — Progress Notes (Signed)
Pt escorted to exit of facility via wheelchair. Marcus [name], Marcus [name]

## 2011-09-25 NOTE — Progress Notes (Signed)
[facility] Progress Note  Subjective: I don't know why I'm here.  Just woke up and I was in the hospital. Lives with sister and her friend.  Objective Filed Vitals:   09/24/11 1650 09/25/11 0525 09/25/11 0707 09/25/11 0723  BP: 157/72 164/70 184/85 185/84  Pulse: 55 56 58 57  Temp: 98.9 F (37.2 C) 97.3 F (36.3 C) 97.1 F (36.2 C)   TempSrc: Oral Oral Oral   Resp: 14 16 18 18   Weight:   67 kg (147 lb 11.3 oz)   SpO2: 99% 98% 97%    Physical Exam General:on HD NAD Heart: RRR  Lungs:grossly clear without wheezes or rales Abdomen soft NT Extremities: no significant LE edema; SCDs in place Dialysis Access: R LE AVGG Qb 400  Assessment/Plan: 1. Resp distress due to pulm edema- resolved with HD; back on schedule today; need to check sats off )2 2. ESRD - MWF per routine;  3. Anemia - Hgb stable; on Aranesp 100; increase Epo at d/c to 15,000 4. Secondary hyperparathyroidism - P controlled; on zemplar 2; / efficacy of oral vitamin D in ESRD pt. 5. HTN/volume - I think he needs a lower EDW; had been getting slightly below EDW at outpt.  Attempt for post EDW of 63 today and challenge further at his outpt center 6. Nutrition - changed to high protein renal diet due to high K content of carb mod diet. 7. DM with uncontrolled BS - on insulin per primary - Lucianne Muss is endocrinologist 8. Hx CVA x 2 in past - chronic coumadin per pharmcy 9. Hypothyroidism w goiter - No surgery per Dr. Lucianne Muss from notes from his HD center 10. Disp - hopefully d/c today; may need closer supervision at home. He has complex medical management and has little insight into this.  Additional Objective Labs: Basic Metabolic Panel:  Lab 09/24/11 9811 09/23/11 1200 09/23/11 0800 09/22/11 1130  NA 132* 133* 135 --  K 4.7 4.6 4.0 --  CL 95* 95* 95* --  CO2 25 28 27  --  GLUCOSE 246* 169* 153* --  BUN 33* 21 20 --  CREATININE 6.31* 4.98* 4.72* --  CALCIUM 8.7 8.5 8.8 --  ALB -- -- -- --  PHOS 4.6  -- -- 2.4   Liver Function Tests:  Lab 09/22/11 1130  AST 32  ALT 19  ALKPHOS 92  BILITOT 0.4  PROT 8.5*  ALBUMIN 3.5  CBC:  Lab 09/24/11 0443 09/23/11 0400 09/22/11 1130  WBC 7.4 8.7 8.1  NEUTROABS -- -- --  HGB 9.4* 10.2* 9.7*  HCT 29.3* 30.9* 29.4*  MCV 91.3 89.0 88.8  PLT 224 228 265   Blood Culture    Component Value Date/Time   SDES BLOOD RIGHT HAND 09/22/2011 1145   SPECREQUEST BOTTLES DRAWN AEROBIC ONLY 5CC 09/22/2011 1145   CULT        BLOOD CULTURE RECEIVED NO GROWTH TO DATE CULTURE WILL BE HELD FOR 5 DAYS BEFORE ISSUING A FINAL NEGATIVE REPORT 09/22/2011 1145   REPTSTATUS PENDING 09/22/2011 1145   Lab Results  Component Value Date   INR 1.63* 09/25/2011   INR 2.83* 09/24/2011   INR 3.89* 09/23/2011     Cardiac Enzymes:  Lab 09/23/11 0247 09/22/11 2100 09/22/11 1130  CKTOTAL 105 115 117  CKMB 3.1 3.4 3.8  CKMBINDEX -- -- --  TROPONINI <0.30 <0.30 <0.30   CBG:  Lab 09/24/11 2136 09/24/11 1754 09/24/11 1720 09/24/11 1648 09/24/11 1226  GLUCAP 142* 80 65* 45* 285*  Medications:     . amLODipine  10 mg Oral QHS  . cholecalciferol  2,000 Units Oral Daily  . cloNIDine  0.3 mg Oral TID  . darbepoetin  100 mcg Intravenous Q Mon-HD  . docusate sodium  200 mg Oral Daily  . FLUoxetine  5 mg Oral QHS  . insulin aspart  0-15 Units Subcutaneous TID WC  . insulin glargine  10 Units Subcutaneous Daily  . latanoprost  1 drop Both Eyes QHS  . levothyroxine  250 mcg Oral Daily  . metoprolol tartrate  75 mg Oral BID  . multivitamin  1 tablet Oral QHS  . pantoprazole  40 mg Oral Q1200  . paricalcitol  2 mcg Intravenous Q M,W,F-HD  . simvastatin  5 mg Oral q1800  . temazepam  15 mg Oral QHS  . warfarin  5 mg Oral ONCE-1800  . Warfarin - Pharmacist Dosing Inpatient   Does not apply q1800    I  have reviewed scheduled and prn medications.  Sheffield Slider, PA-C [facility] Beeper [phone]  09/25/2011,7:42 AM  LOS: 3 days

## 2011-09-25 NOTE — Progress Notes (Signed)
ANTICOAGULATION CONSULT NOTE - Follow Up Consult  Pharmacy Consult for Coumadin Indication: On prior to admission - has history of CVA  Allergies  Allergen Reactions  . Penicillins     Patient Measurements: Weight: 147 lb 11.3 oz (67 kg) Heparin Dosing Weight:   Vital Signs: Temp: 97.1 F (36.2 C) (06/10 0707) Temp src: Oral (06/10 0707) BP: 154/77 mmHg (06/10 0800) Pulse Rate: 55  (06/10 0800)  Labs:  Basename 09/25/11 0621 09/24/11 0443 09/23/11 1430 09/23/11 1200 09/23/11 0800 09/23/11 0400 09/23/11 0247 09/22/11 2100 09/22/11 1130  HGB -- 9.4* -- -- -- 10.2* -- -- --  HCT -- 29.3* -- -- -- 30.9* -- -- 29.4*  PLT -- 224 -- -- -- 228 -- -- 265  APTT -- -- -- -- -- -- -- -- --  LABPROT 19.6* 30.2* 38.7* -- -- -- -- -- --  INR 1.63* 2.83* 3.89* -- -- -- -- -- --  HEPARINUNFRC -- -- -- -- -- -- -- -- --  CREATININE -- 6.31* -- 4.98* 4.72* -- -- -- --  CKTOTAL -- -- -- -- -- -- 105 115 117  CKMB -- -- -- -- -- -- 3.1 3.4 3.8  TROPONINI -- -- -- -- -- -- <0.30 <0.30 <0.30    CrCl is unknown because there is no height on file for the current visit.  Assessment: 56yom with ESRD and hx CVA on Coumadin PTA to continue on Coumadin during hospitalization. INR (1.63) is now subtherapeutic after significant decrease despite 5mg  dose charted on 6/9. Large INR decrease most likely due to held doses upon admission. PTA regimen: Coumadin 5mg  daily. Will plan to give increased dose tonight to hopefully stop INR fall and begin to trend back up. - No CBC this AM - No significant bleeding reported  Goal of Therapy:  INR 2-3   Plan:  1. Coumadin 7.5mg  po x 1 today 2. Follow-up AM INR and discharge plans  Cleon Dew [phone] 09/25/2011,8:52 AM

## 2011-09-25 NOTE — Discharge Summary (Signed)
Physician Discharge Summary  Patient ID: Marcus [name] MRN: [id number] DOB/AGE: [birth year] 56 y.o.  Admit date: 09/22/2011 Discharge date: 09/25/2011    Discharge Diagnoses:  Active Problems:  ESRD (end stage renal disease) on dialysis  Hypertensive emergency  Diabetic hyperosmolar non-ketotic state  Pulmonary edema  Hyponatremia    Brief Summary: Marcus [name] is a 56 year old male with history of ESRD (M, W, F) presented to [facility] 6/7 with SOB, HA, and chest pain. Was found to be hypertensive (242/112) hyperglycemic (glucose 1215) and hyperosmolar (323).  He was in significant respiratory distress r/t pulmonary edema.  Patient transferred to [facility] for emergent HD and placed on bipap.  His resp status improved quickly with volume removal/ HD.  Serum ketones were neg and blood sugars improved quickly with insulin gtt, and transition back to lantus and SSI. He was followed closely by renal and at time of d/c appears back to baseline.  He remains weak which is near baseline per his sister and should cont his home PT which he was receiving prior to admit.  He will f/u with his PCP and endocrine and cont his MWF HD as outpt.   Respiratory Distress in the setting of pulmonary edema r/t HTN crisis and ESRD- Much improved with HD. Off bipap, on RA.  CXR improved.   HTN crisis - initial BP [id number].  ?compliance with medications at home.  Initially required cardene gtt. Now controlled with PO although did have to increase his home dose clonidine.  F/u with PCP and renal.   ESRD -- per renal.  Had emergent HD 6/7.  Now back on MWF schedule.    Anticoagulation - unclear reason for chronic coumadin.  Was cont on this and monitored by pharmacy.   Mild anemia - likely r/t ESRD.  CBC followed closely, cont on aranesp per renal.   DM - with acute hyperosmolar/hyperglycemic state.  No gap and no serum ketones but initial blood sugar >1200.  Rx with insulin gtt initially, now tol transition back  to lantus and SSI.  He will f/u with endocrinology.   Hypothyroid - Goiter and hypothyroid followed by endocrine as well.  Cont on home dose synthroid with further w/u as outpt.  AMS - resp distress, HTN crisis and acute hyperglyemia.  Quickly resolved.  Now at baseline.    Consults: Renal - [place]  Lines/tubes: R fem CVL ([place]) 6/7>>>6/10  Microbiology/Sepsis markers: none  Discharge Exam: General: chronically ill appearing male, NAD seen on HD Neuro: drowsy, easily arousable, appropriate, gen weakness  CV: s1s2 rrr, NSR/SB PULM: resps even non labored on RA, diminished bases, few scattered ronchi, improved GI: abd soft, +bs Extremities:  Warm and dry, no edema     Discharge Labs  BMET  Lab 09/24/11 0443 09/23/11 1200 09/23/11 0800 09/23/11 0400 09/23/11 09/22/11 1130  NA 132* 133* 135 138 136 --  K 4.7 4.6 -- -- -- --  CL 95* 95* 95* 97 96 --  CO2 25 28 27 27 28  --  GLUCOSE 246* 169* 153* 148* 199* --  BUN 33* 21 20 18 16  --  CREATININE 6.31* 4.98* 4.72* 4.45* 4.04* --  CALCIUM 8.7 8.5 8.8 8.8 8.7 --  MG 1.9 -- -- -- -- 2.3  PHOS 4.6 -- -- -- -- 2.4     CBC   Lab 09/24/11 0443 09/23/11 0400 09/22/11 1130  HGB 9.4* 10.2* 9.7*  HCT 29.3* 30.9* 29.4*  WBC 7.4 8.7 8.1  PLT 224 228 265  Anti-Coagulation  Lab 09/25/11 0621 09/24/11 0443 09/23/11 1430 09/22/11 1130  INR 1.63* 2.83* 3.89* 3.46*    Discharge Orders    Future Orders Please Complete By Expires   Diet general      Comments:   High protein renal AND carb modified diet   Increase activity slowly      Comments:   Home health PT       Follow-up Information    Follow up with [facility], MD on 10/10/2011. (9:45am )    Contact information:   [address] [facility] [place] [address] [phone]       Follow up with Galvin Proffer, MD on 10/03/2011. (1:45pm )    Contact information:   [address] [place]  [address] [phone]           Medication List  As of 09/25/2011  9:34 AM   CHANGE how you take these medications         cloNIDine 0.1 MG tablet   Commonly known as: CATAPRES   Take 3 tablets (0.3 mg total) by mouth 3 (three) times daily. Hold on Hemodialysis days.   What changed: dose      warfarin 5 MG tablet   Commonly known as: COUMADIN   Take 1 1/2 tabs 6/10 then back to 1 tab po daily until further instructions by Dr. Ardelle Park   What changed: - dose - route (how to take the med) - how often to take the med - doctor's instructions         CONTINUE taking these medications         acetaminophen 325 MG tablet   Commonly known as: TYLENOL      amLODipine 10 MG tablet   Commonly known as: NORVASC      aspirin EC 81 MG tablet      cholecalciferol 1000 UNITS tablet   Commonly known as: VITAMIN D      docusate sodium 100 MG capsule   Commonly known as: COLACE      epoetin alfa 2000 UNIT/ML injection   Commonly known as: EPOGEN,PROCRIT      feeding supplement (NEPRO CARB STEADY) Liqd      FLUoxetine 10 MG tablet   Commonly known as: PROZAC      heparin 1000 unit/mL Soln injection      insulin aspart 100 UNIT/ML injection   Commonly known as: novoLOG      insulin glargine 100 UNIT/ML injection   Commonly known as: LANTUS      latanoprost 0.005 % ophthalmic solution   Commonly known as: XALATAN      levothyroxine 100 MCG tablet   Commonly known as: SYNTHROID, LEVOTHROID      metoprolol tartrate 25 MG tablet   Commonly known as: LOPRESSOR      multivitamin Tabs tablet      omeprazole 20 MG capsule   Commonly known as: PRILOSEC      paricalcitol 2 MCG/ML injection   Commonly known as: ZEMPLAR      pravastatin 10 MG tablet   Commonly known as: PRAVACHOL      promethazine 12.5 MG tablet   Commonly known as: PHENERGAN      sorbitol 70 % solution      temazepam 15 MG capsule   Commonly known as: RESTORIL          Where to get your medications     These are the prescriptions that  you need to pick up. We sent them to a specific pharmacy, so you will need to go there to get them.   [facility] [address] [address] Levan, [place] - [address] AT [facility] [address]    [address] [place] [address]    Phone: [phone]        cloNIDine 0.1 MG tablet         Information on where to get these meds is not yet available. Ask your nurse or doctor.         warfarin 5 MG tablet             Disposition: Home with Home health PT and RN  Discharged Condition: Zekiah Caruth has met maximum benefit of inpatient care and is medically stable and cleared for discharge.  Patient is pending follow up as above.      Time spent on disposition:  Greater than 35 minutes.   SignedDanford Bad, NP 09/25/2011  9:34 AM Pager: [phone]  *Care during the described time interval was provided by me and/or other providers on the critical care team. I have reviewed this patient's available data, including medical history, events of note, physical examination and test results as part of my evaluation.   Reviewed above, and examined pt.  Coralyn Helling, MD [facility] Pulmonary/Critical Care 09/25/2011, 4:39 PM Pager:  [phone] After 3pm call: [phone]

## 2011-09-25 NOTE — Progress Notes (Addendum)
   CARE MANAGEMENT NOTE 09/25/2011  Patient:  Marcus [name], Marcus [name]   Account Number:  [id number]  Date Initiated:  09/22/2011  Documentation initiated by:  Marcus [name]  Subjective/Objective Assessment:   Admitted from outside hospital with resp failure - on bipap, hypertension on cardene drip, and hyperglycemic on insulin drip.     Action/Plan:   Per sister pt actice with [facility] and will continue with [facility] for [facility] needs.   Anticipated DC Date:  09/26/2011   Anticipated DC Plan:  HOME W HOME HEALTH SERVICES      DC Planning Services  CM consult      Choice offered to / List presented to:          [facility] arranged  HH-1 RN  HH-2 PT      [facility] agency  [facility]   Status of service:  Completed, signed off Medicare Important Message given?   (If response is "NO", the following Medicare IM given date fields will be blank) Date Medicare IM given:   Date Additional Medicare IM given:    Discharge Disposition:    Per UR Regulation:  Reviewed for med. necessity/level of care/duration of stay  If discussed at Long Length of Stay Meetings, dates discussed:    Comments:  Contact Marcus [name] [address] Marcus [name], Marcus [name] at [phone] or Marcus [name] at [phone] 09/25/2011 [name] needs and info faxed, orders, face to face and d/c summary to [place] office at [phone] and fax number [phone].          Marcus Shock RN MPH Pt states that he also has a [facility] aide who stays with him 24 hr per day that his sister has arranged. CRoyal RN MPH 09-22-11 2:30pm Marcus [name], RNBSN [phone] patient has no visitors at this time,  nurse reports had a call from a "sister" who was coming in.  Did not get name. Attempted to call Marcus [name] at above number with no answer. Attempted to call Marcus [name] at above number and there was no answer and her voice mail was full.

## 2011-09-25 NOTE — Progress Notes (Signed)
I have seen and examined this patient and agree with the plan of care seen on dialysis  [facility] W 09/25/2011, 8:45 AM

## 2011-09-25 NOTE — Progress Notes (Signed)
AVS reviewed with pt. Pt able to verbalize understanding of AVS instructions and questions were answered. Central line removed by IV nurse. Dressing dry and intact. Pt remains stable. Marcus [name], pt's wife, informed of pt DC and need for pt to be picked up from hospital. Family to come get pt. [place], Rosanna Randy

## 2011-09-25 NOTE — Progress Notes (Signed)
Per MD order, central line removed from groin. IV cathter intact. Vaseline pressure gauze to site, pressure held x 5 min, no bleeding to site. Pt instructed not to get out of bed for 30 min after the removal of the central line. Instucted to keep dressing CDI x 24hours, if bleeding occurs hold pressure, if bleeding does not stop contact MD or go to the ED. Pt verbalized understanding and did not have any questions. Pressure held by 15 min by me and 15 min by the student nurse. Marcus [name]

## 2011-09-25 NOTE — Procedures (Signed)
I have seen and examined this patient and agree with the plan of care seen on dialysis  [facility] W 09/25/2011, 8:46 AM

## 2011-09-26 LAB — GLUCOSE, CAPILLARY
Glucose-Capillary: 128 mg/dL — ABNORMAL HIGH (ref 70–99)
Glucose-Capillary: 78 mg/dL (ref 70–99)
Glucose-Capillary: 117 mg/dL — ABNORMAL HIGH (ref 70–99)
Glucose-Capillary: 294 mg/dL — ABNORMAL HIGH (ref 70–99)
Glucose-Capillary: 107 mg/dL — ABNORMAL HIGH (ref 70–99)
Glucose-Capillary: 84 mg/dL (ref 70–99)

## 2011-09-29 LAB — CULTURE, BLOOD (ROUTINE X 2): Culture: NO GROWTH

## 2011-10-31 ENCOUNTER — Encounter (INDEPENDENT_AMBULATORY_CARE_PROVIDER_SITE_OTHER): Payer: Medicare Other | Admitting: Surgery

## 2012-02-20 ENCOUNTER — Other Ambulatory Visit: Payer: Self-pay | Admitting: *Deleted

## 2012-02-20 DIAGNOSIS — N186 End stage renal disease: Secondary | ICD-10-CM

## 2012-02-20 DIAGNOSIS — T82598A Other mechanical complication of other cardiac and vascular devices and implants, initial encounter: Secondary | ICD-10-CM

## 2012-03-06 ENCOUNTER — Encounter: Payer: Self-pay | Admitting: Vascular Surgery

## 2012-03-07 ENCOUNTER — Ambulatory Visit: Payer: Medicare Other | Admitting: Vascular Surgery

## 2012-03-20 ENCOUNTER — Encounter: Payer: Self-pay | Admitting: Vascular Surgery

## 2012-03-21 ENCOUNTER — Encounter: Payer: Self-pay | Admitting: Vascular Surgery

## 2012-03-21 ENCOUNTER — Ambulatory Visit (INDEPENDENT_AMBULATORY_CARE_PROVIDER_SITE_OTHER): Payer: Medicaid Other | Admitting: Vascular Surgery

## 2012-03-21 ENCOUNTER — Encounter (INDEPENDENT_AMBULATORY_CARE_PROVIDER_SITE_OTHER): Payer: Medicare Other | Admitting: *Deleted

## 2012-03-21 VITALS — BP 109/56 | HR 69 | Wt 142.0 lb

## 2012-03-21 DIAGNOSIS — T82598A Other mechanical complication of other cardiac and vascular devices and implants, initial encounter: Secondary | ICD-10-CM

## 2012-03-21 DIAGNOSIS — N186 End stage renal disease: Secondary | ICD-10-CM

## 2012-03-21 NOTE — Progress Notes (Signed)
Patient is a 56 year old male on chronic hemodialysis Monday Wednesday and Friday. He has been having episodes of prolonged bleeding after cannulation of his AV graft. He has had a graft for several years. He is on chronic Coumadin for what is family member suggests was a "blockage in his neck". There is no obvious history of atrial fibrillation or prosthetic cardiac valve. Other chronic medical problems include diabetes hypertension hyperlipidemia all of which are currently stable.  Past Medical History  Diagnosis Date  . ESRD (end stage renal disease) on dialysis     uncertain cause/duration, prob DM  . Diabetes mellitus   . HTN (hypertension)   . Hypothyroidism   . Hyperlipidemia   . CVA (cerebrovascular accident)     hx CVA x 2  . GI bleed     2006, mallory-weiss tear  . Pulmonary edema     nov 2011, dry wt decreased  . DKA (diabetic ketoacidosis)     severe, Dec 2011  . Goiter     by CT Apr 2013  . Depression   . Cocaine abuse     past history  . Tobacco abuse     history of     Review of systems: He denies shortness of breath. He denies chest pain.  Current Outpatient Prescriptions on File Prior to Visit  Medication Sig Dispense Refill  . acetaminophen (TYLENOL) 325 MG tablet Take 650 mg by mouth every 6 (six) hours as needed. For pain.      Marland Kitchen amLODipine (NORVASC) 10 MG tablet Take 10 mg by mouth daily.      Marland Kitchen aspirin EC 81 MG tablet Take 81 mg by mouth daily.      . cholecalciferol (VITAMIN D) 1000 UNITS tablet Take 2,000 Units by mouth daily.      . cloNIDine (CATAPRES) 0.1 MG tablet Take 3 tablets (0.3 mg total) by mouth 3 (three) times daily. Hold on Hemodialysis days.  60 tablet  0  . docusate sodium (COLACE) 100 MG capsule Take 200 mg by mouth daily.      Marland Kitchen epoetin alfa (EPOGEN,PROCRIT) 2000 UNIT/ML injection Inject 12,400 Units into the skin every Monday, Wednesday, and Friday with hemodialysis.      Marland Kitchen FLUoxetine (PROZAC) 10 MG tablet Take 5 mg by mouth at bedtime.       . heparin 1000 unit/mL SOLN injection 2,400 Units by Dialysis route every Monday, Wednesday, and Friday with hemodialysis. 2400 Units = 2.4 mL      . insulin aspart (NOVOLOG) 100 UNIT/ML injection Inject 3-5 Units into the skin 3 (three) times daily before meals. Inject 3 units after breakfast, 4 units after lunch, and 5 units after supper.      . insulin glargine (LANTUS) 100 UNIT/ML injection Inject 6-10 Units into the skin 2 (two) times daily. Inject 10 units in the morning and 6 units at night.      . latanoprost (XALATAN) 0.005 % ophthalmic solution Place 1 drop into both eyes at bedtime.      Marland Kitchen levothyroxine (SYNTHROID, LEVOTHROID) 100 MCG tablet Take 125 mcg by mouth daily.       . metoprolol tartrate (LOPRESSOR) 25 MG tablet Take 75 mg by mouth 2 (two) times daily. Hold on Hemodialysis days.      . multivitamin (RENA-VIT) TABS tablet Take 1 tablet by mouth daily.      . Nutritional Supplements (FEEDING SUPPLEMENT, NEPRO CARB STEADY,) LIQD Take 237 mLs by mouth 3 (three) times daily as needed.      Marland Kitchen  omeprazole (PRILOSEC) 20 MG capsule Take 20 mg by mouth daily.      . paricalcitol (ZEMPLAR) 2 MCG/ML injection Inject 2 mcg into the vein every Monday, Wednesday, and Friday with hemodialysis.      Marland Kitchen pravastatin (PRAVACHOL) 10 MG tablet Take 10 mg by mouth at bedtime.      . promethazine (PHENERGAN) 12.5 MG tablet Take 12.5 mg by mouth daily as needed. For nausea/vomiting.      . sorbitol 70 % solution Take 30 mLs by mouth daily as needed. For constipation.      . temazepam (RESTORIL) 15 MG capsule Take 15 mg by mouth at bedtime.      Marland Kitchen warfarin (COUMADIN) 5 MG tablet Take 1 1/2 tabs 6/10 then back to 1 tab po daily until further instructions by Dr. Ardelle Park  30 tablet  0     Physical exam: Filed Vitals:   03/21/12 1217  BP: 109/56  Pulse: 69  Weight: 142 lb (64.411 kg)  SpO2: 95%    Right upper extremity: Right forearm AV graft with audible bruit palpable thrill above graft no  aneurysmal degeneration, right hand slightly cool absent radial pulse  Data: The patient had a duplex of his AV graft today. Along the venous limb there was an area of thrombus lining the graft as well as elevated velocities of 525 cm/s. I reviewed and interpreted this study.  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  Plan: The patient will be scheduled for shuntogram on his nondialysis day 03/28/2012 by mouth for Dr. Imogene Burn. Possible angioplasty at that time. We will stop his Coumadin on December 9.  Fabienne Bruns, MD [facility] Office: [phone] Pager: [phone]

## 2012-03-22 ENCOUNTER — Encounter (HOSPITAL_COMMUNITY): Payer: Self-pay | Admitting: Pharmacy Technician

## 2012-03-26 ENCOUNTER — Other Ambulatory Visit: Payer: Self-pay

## 2012-03-28 ENCOUNTER — Ambulatory Visit (HOSPITAL_COMMUNITY)
Admission: RE | Admit: 2012-03-28 | Discharge: 2012-03-28 | Disposition: A | Discharge status: Home / Self Care | Payer: Medicare Other | Source: Ambulatory Visit | Attending: Vascular Surgery | Admitting: Vascular Surgery

## 2012-03-28 ENCOUNTER — Encounter (HOSPITAL_COMMUNITY)
Admission: RE | Disposition: A | Discharge status: Home / Self Care | Payer: Self-pay | Source: Ambulatory Visit | Attending: Vascular Surgery

## 2012-03-28 DIAGNOSIS — Y849 Medical procedure, unspecified as the cause of abnormal reaction of the patient, or of later complication, without mention of misadventure at the time of the procedure: Secondary | ICD-10-CM | POA: Insufficient documentation

## 2012-03-28 DIAGNOSIS — N186 End stage renal disease: Secondary | ICD-10-CM | POA: Insufficient documentation

## 2012-03-28 DIAGNOSIS — Z8673 Personal history of transient ischemic attack (TIA), and cerebral infarction without residual deficits: Secondary | ICD-10-CM | POA: Insufficient documentation

## 2012-03-28 DIAGNOSIS — E785 Hyperlipidemia, unspecified: Secondary | ICD-10-CM | POA: Insufficient documentation

## 2012-03-28 DIAGNOSIS — T82898A Other specified complication of vascular prosthetic devices, implants and grafts, initial encounter: Secondary | ICD-10-CM

## 2012-03-28 DIAGNOSIS — E039 Hypothyroidism, unspecified: Secondary | ICD-10-CM | POA: Insufficient documentation

## 2012-03-28 DIAGNOSIS — E119 Type 2 diabetes mellitus without complications: Secondary | ICD-10-CM | POA: Insufficient documentation

## 2012-03-28 DIAGNOSIS — I120 Hypertensive chronic kidney disease with stage 5 chronic kidney disease or end stage renal disease: Secondary | ICD-10-CM | POA: Insufficient documentation

## 2012-03-28 DIAGNOSIS — Z87891 Personal history of nicotine dependence: Secondary | ICD-10-CM | POA: Insufficient documentation

## 2012-03-28 HISTORY — PX: SHUNTOGRAM: SHX5491

## 2012-03-28 LAB — POCT I-STAT, CHEM 8
BUN: 35 mg/dL — ABNORMAL HIGH (ref 6–23)
Creatinine, Ser: 5.8 mg/dL — ABNORMAL HIGH (ref 0.50–1.35)
Glucose, Bld: 338 mg/dL — ABNORMAL HIGH (ref 70–99)
Potassium: 4.7 mEq/L (ref 3.5–5.1)
Sodium: 131 mEq/L — ABNORMAL LOW (ref 135–145)

## 2012-03-28 SURGERY — ASSESSMENT, SHUNT FUNCTION, WITH CONTRAST RADIOGRAPHIC STUDY
Anesthesia: LOCAL | Laterality: Right

## 2012-03-28 MED ORDER — LIDOCAINE HCL (PF) 1 % IJ SOLN
INTRAMUSCULAR | Status: AC
  Filled 2012-03-28: qty 30

## 2012-03-28 MED ORDER — SODIUM CHLORIDE 0.9 % IJ SOLN: 3.0000 mL | INTRAMUSCULAR | Status: DC | PRN

## 2012-03-28 MED ORDER — ATROPINE SULFATE 1 MG/ML IJ SOLN
INTRAMUSCULAR | Status: AC
  Filled 2012-03-28: qty 2

## 2012-03-28 MED ORDER — HEPARIN (PORCINE) IN NACL 2-0.9 UNIT/ML-% IJ SOLN
INTRAMUSCULAR | Status: AC
  Filled 2012-03-28: qty 500

## 2012-03-28 MED ORDER — HYDRALAZINE HCL 20 MG/ML IJ SOLN
INTRAMUSCULAR | Status: AC
  Filled 2012-03-28: qty 1

## 2012-03-28 NOTE — H&P (Signed)
[facility]  Brief History and Physical  History of Present Illness  Marcus [name] is a 56 y.o. male who presents with chief complaint: bleeding from L FA AVG.  The patient presents today for R arm shuntogram, possible intervention.    Past Medical History  Diagnosis Date  . ESRD (end stage renal disease) on dialysis     uncertain cause/duration, prob DM  . Diabetes mellitus   . HTN (hypertension)   . Hypothyroidism   . Hyperlipidemia   . CVA (cerebrovascular accident)     hx CVA x 2  . GI bleed     2006, mallory-weiss tear  . Pulmonary edema     nov 2011, dry wt decreased  . DKA (diabetic ketoacidosis)     severe, Dec 2011  . Goiter     by CT Apr 2013  . Depression   . Cocaine abuse     past history  . Tobacco abuse     history of    No past surgical history on file.  History   Social History  . Marital Status: Married    Spouse Name: N/A    Number of Children: N/A  . Years of Education: N/A   Occupational History  . Not on file.   Social History Main Topics  . Smoking status: Current Every Day Smoker -- 0.5 packs/day    Types: Cigarettes  . Smokeless tobacco: Never Used     Comment: pt states that he has been thinking about giving it up  . Alcohol Use: No  . Drug Use: No  . Sexually Active: Not on file   Other Topics Concern  . Not on file   Social History Narrative  . No narrative on file    No family history on file.  No current facility-administered medications on file prior to encounter.   Current Outpatient Prescriptions on File Prior to Encounter  Medication Sig Dispense Refill  . amLODipine (NORVASC) 10 MG tablet Take 10 mg by mouth daily.      Marland Kitchen aspirin EC 81 MG tablet Take 81 mg by mouth daily.      . calcium acetate (PHOSLO) 667 MG capsule Take by mouth daily. 4 capsules in the morning, 3 capsules @ noon, 4 capsules @night       . cloNIDine (CATAPRES) 0.1 MG tablet Take 3 tablets (0.3 mg total) by mouth 3  (three) times daily. Hold on Hemodialysis days.  60 tablet  0  . docusate sodium (COLACE) 100 MG capsule Take 200 mg by mouth daily.      Marland Kitchen FLUoxetine (PROZAC) 10 MG tablet Take 5 mg by mouth at bedtime.      Marland Kitchen HYDROcodone-acetaminophen (NORCO) 10-325 MG per tablet Take by mouth as needed.      . insulin aspart (NOVOLOG) 100 UNIT/ML injection Inject 3-5 Units into the skin 3 (three) times daily before meals. Inject 3 units after breakfast, 4 units after lunch, and 5 units after supper.      . insulin glargine (LANTUS) 100 UNIT/ML injection Inject 6-13 Units into the skin 2 (two) times daily. Inject 13 units in the morning, and 6 units in the evening.      . latanoprost (XALATAN) 0.005 % ophthalmic solution Place 1 drop into both eyes at bedtime.      Marland Kitchen levothyroxine (SYNTHROID, LEVOTHROID) 100 MCG tablet Take 125 mcg by mouth daily.       . metoprolol tartrate (LOPRESSOR) 25 MG tablet Take  75 mg by mouth 2 (two) times daily. Hold on Hemodialysis days.      . Nutritional Supplements (FEEDING SUPPLEMENT, NEPRO CARB STEADY,) LIQD Take 237 mLs by mouth 3 (three) times daily as needed.      Marland Kitchen omeprazole (PRILOSEC) 20 MG capsule Take 20 mg by mouth daily.      . pravastatin (PRAVACHOL) 10 MG tablet Take 10 mg by mouth at bedtime.      . promethazine (PHENERGAN) 12.5 MG tablet Take 12.5 mg by mouth daily as needed. For nausea/vomiting.      . temazepam (RESTORIL) 15 MG capsule Take 15 mg by mouth at bedtime.      Marland Kitchen warfarin (COUMADIN) 1 MG tablet Tues., Thurs., Sat., and Sun., patient uses 5 mg.  On Mon., Wed., and Fri., patient uses 4 mg.      . warfarin (COUMADIN) 5 MG tablet Take 1 1/2 tabs 6/10 then back to 1 tab po daily until further instructions by Dr. Ardelle Park  30 tablet  0  . epoetin alfa (EPOGEN,PROCRIT) 2000 UNIT/ML injection Inject 12,400 Units into the skin every Monday, Wednesday, and Friday with hemodialysis.      . heparin 1000 unit/mL SOLN injection 2,400 Units by Dialysis route every Monday,  Wednesday, and Friday with hemodialysis. 2400 Units = 2.4 mL      . paricalcitol (ZEMPLAR) 2 MCG/ML injection Inject 2 mcg into the vein every Monday, Wednesday, and Friday with hemodialysis.        Allergies  Allergen Reactions  . Penicillins     Review of Systems: As listed above, otherwise negative.  Physical Examination  Filed Vitals:   03/28/12 0904  BP: 158/78  Pulse: 65  Temp: 98.5 F (36.9 C)  TempSrc: Oral  Resp: 18  Height: 6\' 2"  (1.88 m)  Weight: 145 lb (65.772 kg)  SpO2: 98%    General:minimally responsive, older than stated age  Pulmonary: Sym exp, good air movt, CTAB, no rales, rhonchi, & wheezing  Cardiac: RRR, Nl S1, S2, no Murmurs, rubs or gallops  Gastrointestinal: soft, NTND, -G/R, - HSM, - masses, - CVAT B  Musculoskeletal: M/S 5/5 throughout , Extremities without ischemic changes , R FA AVG with thrill and bruit and pulsatile character over venous limb  Laboratory See iStat  Medical Decision Making  Marcus [name] is a 56 y.o. male who presents with: bleeding from R FA AVG.   The patient is scheduled for: R arm shuntogram, possible intervention. I discussed with the patient the nature of angiographic procedures, especially the limited patencies of any endovascular intervention.  The patient is aware of that the risks of an angiographic procedure include but are not limited to: bleeding, infection, access site complications, renal failure, embolization, rupture of vessel, dissection, possible need for emergent surgical intervention, possible need for surgical procedures to treat the patient's pathology, and stroke and death.    The patient is aware of the risks and agrees to proceed.  Leonides Sake, MD [facility] Office: [phone] Pager: [phone]  03/28/2012, 8:47 AM

## 2012-03-28 NOTE — Op Note (Addendum)
OPERATIVE NOTE   PROCEDURE: 1. right forearm arteriovenous graft cannulation under ultrasound guidance 2. right arm shuntogram  PRE-OPERATIVE DIAGNOSIS: Malfunctioning right arteriovenous arteriovenous graft   POST-OPERATIVE DIAGNOSIS: same as above   SURGEON: Leonides Sake, MD  ANESTHESIA: local  ESTIMATED BLOOD LOSS: 5 cc  FINDING(S): 1. 50% stenosis at venous anastomosis 2. Patent mid-upper arm stent 3. Occluded innominate stent 4. Venous drainage via chest collaterals and mediastinal collaterals  SPECIMEN(S):  None  CONTRAST: 25 cc  INDICATIONS: Marcus [name] is a 56 y.o. male who  presents with bleeding right forearm arteriovenous graft.  The patient is scheduled for right arm shuntogram.  The patient is aware the risks include but are not limited to: bleeding, infection, thrombosis of the cannulated access, and possible anaphylactic reaction to the contrast.  The patient is aware of the risks of the procedure and elects to proceed forward.  DESCRIPTION: After full informed written consent was obtained, the patient was brought back to the angiography suite and placed supine upon the angiography table.  The patient was connected to monitoring equipment.  The right forearm was prepped and draped in the standard fashion for a right arm shuntogram.  Under ultrasound guidance, the right forearm arteriovenous was cannulated with a micropuncture needle.  The microwire was advanced into the fistula and the needle was exchanged for the a microsheath, which was lodged 2 cm into the access.  The wire was removed and the sheath was connected to the IV extension tubing.  Hand injections were completed to image the access from the antecubitum up to the level of axilla.  The central venous structures were also imaged by hand injections.  Based on the images, this patient will need: need new arteriovenous graft placement.  The right innominate vein is occluded so intervening on the anastomotic  stenosis is futile.  A 4-0 Monocryl purse-string suture was sewn around the sheath.  The sheath was removed while tying down the suture.  A sterile bandage was applied to the puncture site.  COMPLICATIONS: none  CONDITION: stable  Leonides Sake, MD [facility] [place] Office: [phone] Pager: [phone]  03/28/2012 12:41 PM

## 2012-03-29 ENCOUNTER — Telehealth: Payer: Self-pay | Admitting: Vascular Surgery

## 2012-03-29 NOTE — Telephone Encounter (Addendum)
Message copied by Rosalyn Charters on Fri Mar 29, 2012 10:19 AM ------      Message from: Melene Plan      Created: Thu Mar 28, 2012  3:30 PM                   ----- Message -----         From: Fransisco Hertz, MD         Sent: 03/28/2012  12:50 PM           To: Reuel Derby, Melene Plan, RN            Mac Dowdell      [phone]      [birth year]            PROCEDURE:      1. right forearm arteriovenous graft cannulation under ultrasound guidance      2.  right arm shuntogram            Follow-up: 2-4 week w/ Dr. Darrick Penna  notified patient of fu appt. on 04-19-11 at 9:15 am as per staff message on 03-28-12

## 2012-04-16 ENCOUNTER — Encounter: Payer: Self-pay | Admitting: Vascular Surgery

## 2012-04-18 ENCOUNTER — Other Ambulatory Visit: Payer: Self-pay | Admitting: *Deleted

## 2012-04-18 ENCOUNTER — Ambulatory Visit (INDEPENDENT_AMBULATORY_CARE_PROVIDER_SITE_OTHER): Payer: Medicare Other | Admitting: Vascular Surgery

## 2012-04-18 ENCOUNTER — Encounter: Payer: Self-pay | Admitting: *Deleted

## 2012-04-18 ENCOUNTER — Encounter: Payer: Self-pay | Admitting: Vascular Surgery

## 2012-04-18 VITALS — BP 115/70 | HR 65 | Ht 74.0 in | Wt 147.3 lb

## 2012-04-18 DIAGNOSIS — N186 End stage renal disease: Secondary | ICD-10-CM

## 2012-04-18 NOTE — Progress Notes (Signed)
Patient is a 57-year-old male who returns today following her right arm shuntogram for evaluation of prolonged bleeding from his AV graft. Shuntogram performed on December 12 showed a patent right upper arm stent and a patent graft. 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 He has had previous grafts in his left upper extremity all of which have failed at this point. He states the bleeding problem from his graft has slightly improved. He dialyzes on Monday Wednesday and Friday. Chronic medical problems remain diabetes, hypertension, hyperlipidemia, prior stroke.  These are all currently stable. He is on Coumadin.  Review of systems: Neuro: He has fairly severe diabetic neuropathy which causes some balance issues with him. Cardiac: Denies chest pain respiratory: Denies shortness of breath  Past Medical History  Diagnosis Date  . ESRD (end stage renal disease) on dialysis     uncertain cause/duration, prob DM  . Diabetes mellitus   . HTN (hypertension)   . Hypothyroidism   . Hyperlipidemia   . CVA (cerebrovascular accident)     hx CVA x 2  . GI bleed     2006, mallory-weiss tear  . Pulmonary edema     nov 2011, dry wt decreased  . DKA (diabetic ketoacidosis)     severe, Dec 2011  . Goiter     by CT Apr 2013  . Depression   . Cocaine abuse     past history  . 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  Tentatively the patient will go home the same day as the operation as he would like to dialyze at his normal center on Wednesday morning early. We will stop his Coumadin today.  Lorcan Fields, MD [facility] Office: [phone] Pager: [phone]  

## 2012-04-19 ENCOUNTER — Encounter (HOSPITAL_COMMUNITY): Payer: Self-pay | Admitting: Respiratory Therapy

## 2012-04-22 ENCOUNTER — Encounter (HOSPITAL_COMMUNITY): Payer: Self-pay

## 2012-04-22 MED ORDER — VANCOMYCIN HCL 10 G IV SOLR
1500.0000 mg | INTRAVENOUS | Status: DC
  Filled 2012-04-22: qty 1500

## 2012-04-23 ENCOUNTER — Encounter (HOSPITAL_COMMUNITY): Payer: Self-pay | Admitting: *Deleted

## 2012-04-23 ENCOUNTER — Encounter (HOSPITAL_COMMUNITY): Payer: Self-pay | Admitting: Anesthesiology

## 2012-04-23 ENCOUNTER — Ambulatory Visit (HOSPITAL_COMMUNITY)
Admission: RE | Admit: 2012-04-23 | Discharge: 2012-04-23 | Disposition: A | Discharge status: Home / Self Care | Payer: Medicare Other | Source: Ambulatory Visit | Attending: Vascular Surgery | Admitting: Vascular Surgery

## 2012-04-23 ENCOUNTER — Telehealth: Payer: Self-pay | Admitting: Vascular Surgery

## 2012-04-23 ENCOUNTER — Encounter (HOSPITAL_COMMUNITY)
Admission: RE | Disposition: A | Discharge status: Home / Self Care | Payer: Self-pay | Source: Ambulatory Visit | Attending: Vascular Surgery

## 2012-04-23 ENCOUNTER — Ambulatory Visit (HOSPITAL_COMMUNITY): Payer: Medicare Other | Admitting: Anesthesiology

## 2012-04-23 DIAGNOSIS — Z8673 Personal history of transient ischemic attack (TIA), and cerebral infarction without residual deficits: Secondary | ICD-10-CM | POA: Insufficient documentation

## 2012-04-23 DIAGNOSIS — E785 Hyperlipidemia, unspecified: Secondary | ICD-10-CM | POA: Insufficient documentation

## 2012-04-23 DIAGNOSIS — E1142 Type 2 diabetes mellitus with diabetic polyneuropathy: Secondary | ICD-10-CM | POA: Insufficient documentation

## 2012-04-23 DIAGNOSIS — Z992 Dependence on renal dialysis: Secondary | ICD-10-CM | POA: Insufficient documentation

## 2012-04-23 DIAGNOSIS — Y832 Surgical operation with anastomosis, bypass or graft as the cause of abnormal reaction of the patient, or of later complication, without mention of misadventure at the time of the procedure: Secondary | ICD-10-CM | POA: Insufficient documentation

## 2012-04-23 DIAGNOSIS — I120 Hypertensive chronic kidney disease with stage 5 chronic kidney disease or end stage renal disease: Secondary | ICD-10-CM | POA: Insufficient documentation

## 2012-04-23 DIAGNOSIS — T82598A Other mechanical complication of other cardiac and vascular devices and implants, initial encounter: Secondary | ICD-10-CM | POA: Insufficient documentation

## 2012-04-23 DIAGNOSIS — N186 End stage renal disease: Secondary | ICD-10-CM

## 2012-04-23 DIAGNOSIS — Z7901 Long term (current) use of anticoagulants: Secondary | ICD-10-CM | POA: Insufficient documentation

## 2012-04-23 DIAGNOSIS — E1149 Type 2 diabetes mellitus with other diabetic neurological complication: Secondary | ICD-10-CM | POA: Insufficient documentation

## 2012-04-23 DIAGNOSIS — E039 Hypothyroidism, unspecified: Secondary | ICD-10-CM | POA: Insufficient documentation

## 2012-04-23 HISTORY — PX: AV FISTULA PLACEMENT: SHX1204

## 2012-04-23 HISTORY — DX: Other specified complication of vascular prosthetic devices, implants and grafts, initial encounter: T82.898A

## 2012-04-23 HISTORY — DX: Myoneural disorder, unspecified: G70.9

## 2012-04-23 HISTORY — DX: Gastro-esophageal reflux disease without esophagitis: K21.9

## 2012-04-23 HISTORY — DX: Unspecified cataract: H26.9

## 2012-04-23 HISTORY — DX: Insomnia, unspecified: G47.00

## 2012-04-23 HISTORY — DX: Other chronic pain: G89.29

## 2012-04-23 LAB — POCT I-STAT 4, (NA,K, GLUC, HGB,HCT)
Glucose, Bld: 54 mg/dL — ABNORMAL LOW (ref 70–99)
HCT: 36 % — ABNORMAL LOW (ref 39.0–52.0)
Hemoglobin: 12.2 g/dL — ABNORMAL LOW (ref 13.0–17.0)

## 2012-04-23 LAB — GLUCOSE, CAPILLARY
Glucose-Capillary: 37 mg/dL — CL (ref 70–99)
Glucose-Capillary: 111 mg/dL — ABNORMAL HIGH (ref 70–99)

## 2012-04-23 LAB — SURGICAL PCR SCREEN: MRSA, PCR: NEGATIVE

## 2012-04-23 SURGERY — INSERTION OF ARTERIOVENOUS (AV) GORE-TEX GRAFT THIGH
Anesthesia: General | Site: Leg Upper | Laterality: Left | Wound class: Clean

## 2012-04-23 MED ORDER — ONDANSETRON HCL 4 MG/2ML IJ SOLN
4.0000 mg | Freq: Once | INTRAMUSCULAR | Status: AC
  Administered 2012-04-23: 4 mg via INTRAVENOUS

## 2012-04-23 MED ORDER — SODIUM CHLORIDE 0.9 % IJ SOLN
INTRAMUSCULAR | Status: AC
  Filled 2012-04-23: qty 3

## 2012-04-23 MED ORDER — OXYCODONE HCL 5 MG/5ML PO SOLN: 5.0000 mg | Freq: Once | ORAL | Status: DC | PRN

## 2012-04-23 MED ORDER — SODIUM CHLORIDE 0.9 % IV SOLN
INTRAVENOUS | Status: DC | PRN
  Administered 2012-04-23: 08:00:00 via INTRAVENOUS

## 2012-04-23 MED ORDER — EPHEDRINE SULFATE 50 MG/ML IJ SOLN
INTRAMUSCULAR | Status: DC | PRN
  Administered 2012-04-23 (×2): 10 mg via INTRAVENOUS

## 2012-04-23 MED ORDER — ONDANSETRON HCL 4 MG/2ML IJ SOLN
INTRAMUSCULAR | Status: AC
  Filled 2012-04-23: qty 2

## 2012-04-23 MED ORDER — LIDOCAINE HCL (CARDIAC) 20 MG/ML IV SOLN
INTRAVENOUS | Status: DC | PRN
  Administered 2012-04-23: 40 mg via INTRAVENOUS

## 2012-04-23 MED ORDER — THROMBIN 20000 UNITS EX SOLR
CUTANEOUS | Status: DC | PRN
  Administered 2012-04-23: 10:00:00 via TOPICAL

## 2012-04-23 MED ORDER — HYDROCODONE-ACETAMINOPHEN 10-325 MG PO TABS: 1.0000 | ORAL_TABLET | Freq: Four times a day (QID) | ORAL | Status: DC | PRN

## 2012-04-23 MED ORDER — VANCOMYCIN HCL IN DEXTROSE 1-5 GM/200ML-% IV SOLN
1000.0000 mg | INTRAVENOUS | Status: AC
  Administered 2012-04-23: 1000 mg via INTRAVENOUS
  Filled 2012-04-23 (×2): qty 200

## 2012-04-23 MED ORDER — SODIUM CHLORIDE 0.9 % IV SOLN: INTRAVENOUS | Status: DC

## 2012-04-23 MED ORDER — MUPIROCIN 2 % EX OINT
TOPICAL_OINTMENT | Freq: Two times a day (BID) | CUTANEOUS | Status: DC
  Administered 2012-04-23: 07:00:00 via NASAL

## 2012-04-23 MED ORDER — FENTANYL CITRATE 0.05 MG/ML IJ SOLN
INTRAMUSCULAR | Status: DC | PRN
  Administered 2012-04-23 (×4): 25 ug via INTRAVENOUS

## 2012-04-23 MED ORDER — SODIUM CHLORIDE 0.9 % IR SOLN
Status: DC | PRN
  Administered 2012-04-23: 08:00:00

## 2012-04-23 MED ORDER — ONDANSETRON HCL 4 MG/2ML IJ SOLN
INTRAMUSCULAR | Status: DC | PRN
  Administered 2012-04-23: 4 mg via INTRAVENOUS

## 2012-04-23 MED ORDER — OXYCODONE HCL 5 MG PO TABS
5.0000 mg | ORAL_TABLET | Freq: Once | ORAL | Status: DC | PRN
Start: 2012-04-23 — End: 2012-04-23

## 2012-04-23 MED ORDER — FENTANYL CITRATE 0.05 MG/ML IJ SOLN
25.0000 ug | INTRAMUSCULAR | Status: DC | PRN
  Administered 2012-04-23: 25 ug via INTRAVENOUS

## 2012-04-23 MED ORDER — MIDAZOLAM HCL 2 MG/2ML IJ SOLN: 0.5000 mg | Freq: Once | INTRAMUSCULAR | Status: DC | PRN

## 2012-04-23 MED ORDER — GLUCOSE 40 % PO GEL
ORAL | Status: AC
  Administered 2012-04-23: 37.5 g via ORAL
  Filled 2012-04-23: qty 1

## 2012-04-23 MED ORDER — DEXTROSE 50 % IV SOLN
INTRAVENOUS | Status: AC
  Administered 2012-04-23: 12.5 g via INTRAVENOUS
  Filled 2012-04-23: qty 50

## 2012-04-23 MED ORDER — MEPERIDINE HCL 25 MG/ML IJ SOLN: 6.2500 mg | INTRAMUSCULAR | Status: DC | PRN

## 2012-04-23 MED ORDER — 0.9 % SODIUM CHLORIDE (POUR BTL) OPTIME
TOPICAL | Status: DC | PRN
  Administered 2012-04-23: 1000 mL

## 2012-04-23 MED ORDER — THROMBIN 20000 UNITS EX SOLR
CUTANEOUS | Status: AC
  Filled 2012-04-23: qty 20000

## 2012-04-23 MED ORDER — PROMETHAZINE HCL 25 MG/ML IJ SOLN: 6.2500 mg | INTRAMUSCULAR | Status: DC | PRN

## 2012-04-23 MED ORDER — FENTANYL CITRATE 0.05 MG/ML IJ SOLN
INTRAMUSCULAR | Status: AC
  Filled 2012-04-23: qty 2

## 2012-04-23 MED ORDER — PROTAMINE SULFATE 10 MG/ML IV SOLN
INTRAVENOUS | Status: DC | PRN
  Administered 2012-04-23: 20 mg via INTRAVENOUS
  Administered 2012-04-23: 10 mg via INTRAVENOUS
  Administered 2012-04-23: 20 mg via INTRAVENOUS

## 2012-04-23 MED ORDER — DEXTROSE 50 % IV SOLN
INTRAVENOUS | Status: AC
  Filled 2012-04-23: qty 50

## 2012-04-23 MED ORDER — MUPIROCIN 2 % EX OINT
TOPICAL_OINTMENT | CUTANEOUS | Status: AC
  Filled 2012-04-23: qty 22

## 2012-04-23 MED ORDER — PROPOFOL 10 MG/ML IV BOLUS
INTRAVENOUS | Status: DC | PRN
  Administered 2012-04-23: 160 mg via INTRAVENOUS

## 2012-04-23 MED ORDER — GLUCOSE 40 % PO GEL
1.0000 | Freq: Once | ORAL | Status: AC
  Administered 2012-04-23: 37.5 g via ORAL

## 2012-04-23 MED ORDER — HEPARIN SODIUM (PORCINE) 1000 UNIT/ML IJ SOLN
INTRAMUSCULAR | Status: DC | PRN
  Administered 2012-04-23: 5000 [IU] via INTRAVENOUS

## 2012-04-23 SURGICAL SUPPLY — 46 items
ADH SKN CLS APL DERMABOND .7 (GAUZE/BANDAGES/DRESSINGS) ×2
CANISTER SUCTION 2500CC (MISCELLANEOUS) ×2 IMPLANT
CLIP TI MEDIUM 6 (CLIP) ×2 IMPLANT
CLIP TI WIDE RED SMALL 6 (CLIP) ×2 IMPLANT
CLOTH BEACON ORANGE TIMEOUT ST (SAFETY) ×2 IMPLANT
COVER SURGICAL LIGHT HANDLE (MISCELLANEOUS) ×2 IMPLANT
DERMABOND ADVANCED (GAUZE/BANDAGES/DRESSINGS) ×2
DERMABOND ADVANCED .7 DNX12 (GAUZE/BANDAGES/DRESSINGS) ×1 IMPLANT
DRAPE INCISE IOBAN 66X45 STRL (DRAPES) ×2 IMPLANT
ELECT REM PT RETURN 9FT ADLT (ELECTROSURGICAL) ×2
ELECTRODE REM PT RTRN 9FT ADLT (ELECTROSURGICAL) ×1 IMPLANT
GEL ULTRASOUND 20GR AQUASONIC (MISCELLANEOUS) IMPLANT
GLOVE BIO SURGEON STRL SZ 6.5 (GLOVE) ×2 IMPLANT
GLOVE BIO SURGEON STRL SZ7.5 (GLOVE) ×2 IMPLANT
GLOVE BIOGEL PI IND STRL 6.5 (GLOVE) IMPLANT
GLOVE BIOGEL PI IND STRL 7.0 (GLOVE) IMPLANT
GLOVE BIOGEL PI INDICATOR 6.5 (GLOVE) ×2
GLOVE BIOGEL PI INDICATOR 7.0 (GLOVE) ×1
GLOVE SS BIOGEL STRL SZ 6.5 (GLOVE) IMPLANT
GLOVE SUPERSENSE BIOGEL SZ 6.5 (GLOVE) ×2
GLOVE SURG SS PI 7.0 STRL IVOR (GLOVE) ×2 IMPLANT
GOWN PREVENTION PLUS XLARGE (GOWN DISPOSABLE) ×2 IMPLANT
GOWN STRL NON-REIN LRG LVL3 (GOWN DISPOSABLE) ×3 IMPLANT
GOWN STRL REIN XL XLG (GOWN DISPOSABLE) ×4 IMPLANT
GRAFT GORETEX STRT 6X50 (Vascular Products) ×1 IMPLANT
KIT BASIN OR (CUSTOM PROCEDURE TRAY) ×2 IMPLANT
KIT ROOM TURNOVER OR (KITS) ×2 IMPLANT
LOOP VESSEL MAXI BLUE (MISCELLANEOUS) ×2 IMPLANT
LOOP VESSEL MINI RED (MISCELLANEOUS) IMPLANT
NS IRRIG 1000ML POUR BTL (IV SOLUTION) ×2 IMPLANT
PACK CV ACCESS (CUSTOM PROCEDURE TRAY) ×2 IMPLANT
PAD ARMBOARD 7.5X6 YLW CONV (MISCELLANEOUS) ×4 IMPLANT
SPONGE GAUZE 4X4 12PLY (GAUZE/BANDAGES/DRESSINGS) ×2 IMPLANT
SPONGE SURGIFOAM ABS GEL 100 (HEMOSTASIS) ×1 IMPLANT
SUT PROLENE 6 0 CC (SUTURE) ×5 IMPLANT
SUT SILK 3 0 (SUTURE) ×2
SUT SILK 3-0 18XBRD TIE 12 (SUTURE) IMPLANT
SUT VIC AB 2-0 CT1 27 (SUTURE) ×2
SUT VIC AB 2-0 CT1 TAPERPNT 27 (SUTURE) ×1 IMPLANT
SUT VIC AB 3-0 SH 27 (SUTURE) ×6
SUT VIC AB 3-0 SH 27X BRD (SUTURE) ×2 IMPLANT
SUT VICRYL 4-0 PS2 18IN ABS (SUTURE) ×4 IMPLANT
TOWEL OR 17X24 6PK STRL BLUE (TOWEL DISPOSABLE) ×2 IMPLANT
TOWEL OR 17X26 10 PK STRL BLUE (TOWEL DISPOSABLE) ×2 IMPLANT
UNDERPAD 30X30 INCONTINENT (UNDERPADS AND DIAPERS) ×2 IMPLANT
WATER STERILE IRR 1000ML POUR (IV SOLUTION) ×2 IMPLANT

## 2012-04-23 NOTE — Progress Notes (Signed)
Attempted IV access x 2 without success. Unable to obtain labs Dr. Katrinka Blazing aware. Patient states he took some pills this am but unsure what and family member with him has stepped away. Patient heart rate does not meet criteria for beta blocker

## 2012-04-23 NOTE — Transfer of Care (Signed)
Immediate Anesthesia Transfer of Care Note  Patient: Marcus [name]  Procedure(s) Performed: Procedure(s) (LRB) with comments: INSERTION OF ARTERIOVENOUS (AV) GORE-TEX GRAFT THIGH (Left)  Patient Location: PACU  Anesthesia Type:General  Level of Consciousness: awake, alert , oriented and patient cooperative  Airway & Oxygen Therapy: Patient Spontanous Breathing and Patient connected to nasal cannula oxygen  Post-op Assessment: Report given to PACU RN, Post -op Vital signs reviewed and stable and Patient moving all extremities  Post vital signs: Reviewed and stable  Complications: No apparent anesthesia complications

## 2012-04-23 NOTE — Interval H&P Note (Signed)
History and Physical Interval Note:  04/23/2012 7:36 AM  Marcus [name]  has presented today for surgery, with the diagnosis of ESRD  The various methods of treatment have been discussed with the patient and family. After consideration of risks, benefits and other options for treatment, the patient has consented to  Procedure(s) (LRB) with comments: INSERTION OF ARTERIOVENOUS (AV) GORE-TEX GRAFT THIGH (Left) as a surgical intervention .  The patient's history has been reviewed, patient examined, no change in status, stable for surgery.  I have reviewed the patient's chart and labs.  Questions were answered to the patient's satisfaction.     FIELDS,Thijs E

## 2012-04-23 NOTE — Progress Notes (Signed)
Call to Dr. Diamantina Monks to report cbg of 37. Order obtained. About 3/4 of tube of glutose 15 administered, which was as much as patient could tolerate.  Unable to obtain IV or labs. Dr. Diamantina Monks notified of this and asked if previous 1 view cxr  Showing some pleural effusion is okay for surgery.  Okay to send him to holding per Dr. Diamantina Monks.

## 2012-04-23 NOTE — Op Note (Signed)
Procedure: Left Thigh AV graft  Preoperative diagnosis: End-stage renal disease  Postoperative diagnosis: Same  Anesthesia: Gen.  Assistant: Doreatha Massed, PA-C  Operative findings: 6 mm PTFE graft to superficial femoral artery end to side and saphenofemoral junction end to side  Operative details: After obtaining informed consent, the patient was taken to the operating room. The patient was placed in supine position operating table. After induction of general anesthesia and endotracheal intubation the patient's entire left anterior thigh and groin were prepped and draped in usual sterile fashion. Next an oblique incision was made in the left groin crease and carried down through the subcutaneous tissues to the level of the saphenous femoral junction. Greater saphenous vein was dissected free for several centimeters and several small side branches ligated and divided between silk ties. The anterior surface of the left common femoral vein was also dissected free in preparation for clamping. Next, the left common femoral artery was dissected free circumferentially. The superficial femoral artery and profunda were dissected free circumferentially for several centimeters after it's origin. Vessel loops were placed proximal and distal the planned site of arteriotomy. Next a subcutaneous tunnel was created in a loop configuration on the anterior aspect of the left thigh. The lateral aspect of the graft was the arterial limb the medial aspect was the venous limb. A transverse incision was made in the distal thigh for assistance in tunneling. The patient was then given 5000 units of intravenous heparin. Vessel loops were used to control the superficial femoral artery proximally and distally. The graft was beveled on the arterial end and sewn end of graft to side of artery using running 6-0 Prolene suture. Just prior to completion of the anastomosis it was forebled backbled and thoroughly flushed.   The  anastomosis was secured; vesseloops were released; and there was pulsatile flow in the graft immediately. There was also still pulsatile flow in the superficial femoral artery below this. Attention was then turned to the venous anastomosis. The graft was pulled taut to length. The distal saphenous vein was ligated with a 2 0 silk tie.  The common femoral and was controlled proximally with a fistula clamp. The saphenous vein was transected and opened longitudinally from the proximal sapenous into the common femoral vein.   The graft was cut to length and beveled and sewn end to end to the saphenofemoral junction with the tip of the graft extending out onto the common femoral vein. This was done with a running 6-0 Prolene suture. Just prior completion anastomosis this was forebled backbled and thoroughly flushed.   The anastomosis was secured,  clamps released, and there was a palpable thrill in the vein immediately. Hemostasis was obtained with thrombin and gelfoam as well as 50 mg protamine. Subcutaneous tissues of both incisions were closed with a running 3-0 Vicryl suture. The skin of both incisions was closed with 4 Vicryl subcuticular stitch. Dermabond was applied to both incisions. Patient tolerated the procedure well and there were no complications. Instrument, sponge, and needle counts were correct at the end of the case. The patient was taken to the recovery room in stable condition.  Fabienne Bruns, MD [facility] [place] Office: [phone] Pager: [phone]

## 2012-04-23 NOTE — Anesthesia Procedure Notes (Signed)
Procedure Name: LMA Insertion Date/Time: 04/23/2012 8:38 AM Performed by: Jerilee Hoh Pre-anesthesia Checklist: Patient identified, Emergency Drugs available, Suction available and Patient being monitored Patient Re-evaluated:Patient Re-evaluated prior to inductionOxygen Delivery Method: Circle system utilized Preoxygenation: Pre-oxygenation with 100% oxygen Intubation Type: IV induction LMA: LMA inserted LMA Size: 4.0 Tube type: Oral Number of attempts: 1 Placement Confirmation: positive ETCO2 and breath sounds checked- equal and bilateral Tube secured with: Tape Dental Injury: Teeth and Oropharynx as per pre-operative assessment

## 2012-04-23 NOTE — Telephone Encounter (Addendum)
Message copied by Shari Prows on Tue Apr 23, 2012 11:20 AM ------      Message from: Sharee Pimple      Created: Tue Apr 23, 2012 10:55 AM      Regarding: schedule                   ----- Message -----         From: Dara Lords, PA         Sent: 04/23/2012  10:24 AM           To: Sharee Pimple, CMA            S/p left thigh AVG placement 04/23/12 by Dr. Darrick Penna.  F/u with him in 2 weeks.            Thanks,      Lelon Mast  I scheduled an appt for the above patient on 05/16/12 at 9am w/ [facility]. I mailed letter to the patient and also left a message for the pt.awt

## 2012-04-23 NOTE — Preoperative (Signed)
Beta Blockers   Reason not to administer Beta Blockers:Hold  beta blocker due to Bradycardia (HR less than 50 bpm) 

## 2012-04-23 NOTE — Anesthesia Postprocedure Evaluation (Signed)
  Anesthesia Post-op Note  Patient: Marcus [name]  Procedure(s) Performed: Procedure(s) (LRB) with comments: INSERTION OF ARTERIOVENOUS (AV) GORE-TEX GRAFT THIGH (Left)  Patient Location: PACU  Anesthesia Type:General  Level of Consciousness: awake, alert  and oriented  Airway and Oxygen Therapy: Patient Spontanous Breathing and Patient connected to nasal cannula oxygen  Post-op Pain: mild  Post-op Assessment: Post-op Vital signs reviewed, Patient's Cardiovascular Status Stable, Respiratory Function Stable, Patent Airway and Pain level controlled  Post-op Vital Signs: stable  Complications: No apparent anesthesia complications

## 2012-04-23 NOTE — Progress Notes (Signed)
Spoke with david zefang renal pa notifed of pts surgery,[place] and K=4.3 instruct pt to return to dialysis tom @ regular time in [facility] dialysis accesss record faxed to [employer]

## 2012-04-23 NOTE — Anesthesia Preprocedure Evaluation (Signed)
Anesthesia Evaluation  Patient identified by MRN, date of birth, ID band Patient awake    Reviewed: Allergy & Precautions, H&P , NPO status , Patient's Chart, lab work & pertinent test results, reviewed documented beta blocker date and time   History of Anesthesia Complications Negative for: history of anesthetic complications  Airway Mallampati: II TM Distance: >3 FB Neck ROM: Full    Dental  (+) Edentulous Upper and Edentulous Lower   Pulmonary COPDCurrent Smoker,  breath sounds clear to auscultation  Pulmonary exam normal       Cardiovascular hypertension, Pt. on medications and Pt. on home beta blockers Rhythm:Regular Rate:Normal  '10 ECHO: normal LVF, EF 60%, mild MR   Neuro/Psych PSYCHIATRIC DISORDERS CVA (uses walker), Residual Symptoms    GI/Hepatic Neg liver ROS, GERD-  Controlled,  Endo/Other  diabetes, Type 1, Insulin Dependent and Oral Hypoglycemic AgentsHypothyroidism   Renal/GU ESRF and DialysisRenal disease (MWF dialysis)     Musculoskeletal   Abdominal   Peds  Hematology coumadin   Anesthesia Other Findings   Reproductive/Obstetrics                           Anesthesia Physical Anesthesia Plan  ASA: III  Anesthesia Plan: General   Post-op Pain Management:    Induction: Intravenous  Airway Management Planned: LMA  Additional Equipment:   Intra-op Plan:   Post-operative Plan:   Informed Consent: I have reviewed the patients History and Physical, chart, labs and discussed the procedure including the risks, benefits and alternatives for the proposed anesthesia with the patient or authorized representative who has indicated his/her understanding and acceptance.   Dental advisory given  Plan Discussed with: Surgeon and CRNA  Anesthesia Plan Comments: (Plan routine monitors, GA- LMA OK)        Anesthesia Quick Evaluation

## 2012-04-23 NOTE — H&P (View-Only) (Signed)
Patient is a 57 year old male who returns today following her right arm shuntogram for evaluation of prolonged bleeding from his AV graft. Shuntogram performed on December 12 showed a patent right upper arm stent and a patent graft. 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 He has had previous grafts in his left upper extremity all of which have failed at this point. He states the bleeding problem from his graft has slightly improved. He dialyzes on Monday Wednesday and Friday. Chronic medical problems remain diabetes, hypertension, hyperlipidemia, prior stroke.  These are all currently stable. He is on Coumadin.  Review of systems: Neuro: He has fairly severe diabetic neuropathy which causes some balance issues with him. Cardiac: Denies chest pain respiratory: Denies shortness of breath  Past Medical History  Diagnosis Date  . ESRD (end stage renal disease) on dialysis     uncertain cause/duration, prob DM  . Diabetes mellitus   . HTN (hypertension)   . Hypothyroidism   . Hyperlipidemia   . CVA (cerebrovascular accident)     hx CVA x 2  . GI bleed     2006, mallory-weiss tear  . Pulmonary edema     nov 2011, dry wt decreased  . DKA (diabetic ketoacidosis)     severe, Dec 2011  . Goiter     by CT Apr 2013  . Depression   . Cocaine abuse     past history  . 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  Tentatively the patient will go home the same day as the operation as he would like to dialyze at his normal center on Wednesday morning early. We will stop his Coumadin today.  Fabienne Bruns, MD [facility] Office: [phone] Pager: [phone]

## 2012-04-23 NOTE — Progress Notes (Signed)
Discussed pain management with patient and Dr. Jean Rosenthal.  Pt suffers from chronic pain and has a prescription for Norco 10 mg tablets for pain.  Will treat pain with IV meds here and recommend using prescriptions to control long term pain.

## 2012-04-24 ENCOUNTER — Encounter (HOSPITAL_COMMUNITY): Payer: Self-pay | Admitting: Vascular Surgery

## 2012-05-01 ENCOUNTER — Telehealth: Payer: Self-pay

## 2012-05-01 NOTE — Telephone Encounter (Signed)
Pt's caregiver, Marcus [name], called 04/30/12 to report that pt. Was in constant pain at surgical site, left thigh.  Stated she had given him Hydrocodone/Acetaminophen 10/325 mg, 1.5 tablets for pain, but that it only helped for a short time, and the pain was back.  Stated the pt. C/o left thigh being bruised, swollen, and hot.  Pt. C/o intermittent sweating.  Advised pt. The nurse would call his dialysis center on 05/01/12 and have an RN at dialysis assess his surgical site.  Verb. Understanding. At 9:00 AM, called the [facility] and spoke with Morrie Sheldon, RN regarding assessing pt's left thigh surgical site.  Morrie Sheldon, RN ret'd call and stated that pt's surgical site looked good; stated there was only slight bruising, no warmth or redness, and the incision was well-approximated.  Stated "the pt's main issue was pain", and that the pt. Stated he had pain medication left at home.  11:00 Called pt's home and spoke with his caregiver, Marcus [name].  Advised that pt. should try to elevate left leg/thigh area above level of heart, due to the swelling.  Also, advised that he should try to transition to an over the counter pain medication, when the Hydrocodone/Acetaminophen runs out.  Enc. To call office if other concerns.  Verb. Understanding.

## 2012-05-15 ENCOUNTER — Encounter: Payer: Self-pay | Admitting: Vascular Surgery

## 2012-05-16 ENCOUNTER — Ambulatory Visit: Payer: Medicare Other | Admitting: Vascular Surgery

## 2012-05-22 ENCOUNTER — Encounter: Payer: Self-pay | Admitting: Vascular Surgery

## 2012-05-23 ENCOUNTER — Ambulatory Visit (INDEPENDENT_AMBULATORY_CARE_PROVIDER_SITE_OTHER): Payer: Medicare Other | Admitting: Vascular Surgery

## 2012-05-23 VITALS — BP 109/53 | HR 54

## 2012-05-23 DIAGNOSIS — N186 End stage renal disease: Secondary | ICD-10-CM

## 2012-05-23 NOTE — Patient Instructions (Signed)
OK to use left thigh graft in HD tomorrow 05/24/12

## 2012-05-23 NOTE — Progress Notes (Signed)
  VASCULAR AND VEIN SURGERY PROGRESS NOTE  POST-OP HEMODIALYSIS ACCESS  Date of Surgery: 04/23/12 Surgeon: Darrick Penna, MD Left thigh graft HD - M,W,F in [place]  HPI: Marcus [name] is a 57 y.o. male who is 1 month S/P creation of left lower extremity Hemodialysis access. The patient denies symptoms of numbness, tingling, weakness; denies pain in the operative limb.  Pt had been Dialyzing through right FA loop graft  Significant Diagnostic Studies: CBC Lab Results  Component Value Date   WBC 7.4 09/24/2011   HGB 12.2* 04/23/2012   HCT 36.0* 04/23/2012   MCV 91.3 09/24/2011   PLT 224 09/24/2011    BMET    Component Value Date/Time   NA 141 04/23/2012 0754   K 4.3 04/23/2012 0754   CL 97 03/28/2012 0939   CO2 25 09/24/2011 0443   GLUCOSE 54* 04/23/2012 0754   BUN 35* 03/28/2012 0939   CREATININE 5.80* 03/28/2012 0939   CALCIUM 8.7 09/24/2011 0443   CALCIUM 8.8 05/20/2008 0705   GFRNONAA 9* 09/24/2011 0443   GFRAA 10* 09/24/2011 0443    COAG Lab Results  Component Value Date   INR 1.01 04/23/2012   INR 1.32 03/28/2012   INR 1.63* 09/25/2011   No results found for this basename: PTT    Vital Signs  BP Readings from Last 3 Encounters:  04/23/12 122/52  04/23/12 122/52  04/18/12 115/70   Temp Readings from Last 3 Encounters:  04/23/12 98.2 F (36.8 C) Oral  04/23/12 98.2 F (36.8 C) Oral  03/28/12 98.5 F (36.9 C) Oral   SpO2 Readings from Last 3 Encounters:  04/23/12 100%  04/23/12 100%  04/18/12 100%   Pulse Readings from Last 3 Encounters:  04/23/12 64  04/23/12 64  04/18/12 65     Physical Examination  left lower extremity Incision is healing well, skin color is normal.  There is a good thrill and good bruit in the left thigh graft Wounds are well healed without drainage. Right forearm loop still has min thrill and bruit  Assessment/Plan Marcus [name] is a 57 y.o. year old male who presents s/p creation/revision of left lower extremity Hemodialysis access.  The  patient's access will be ready for use in HD tomorrow.  Marcus [name] J 05/23/2012 1:45 PM

## 2012-12-12 ENCOUNTER — Telehealth: Payer: Self-pay | Admitting: *Deleted

## 2012-12-12 NOTE — Telephone Encounter (Signed)
Romualdo Bolk, home health nurse called about patient, he was having physical therapy and they called nursing in because his blood sugars would drop, his PCP doesn't want to cover his diabetic orders and she wanted to know if you would? CB # [phone]

## 2012-12-12 NOTE — Telephone Encounter (Signed)
Noted, they are aware. 

## 2012-12-12 NOTE — Telephone Encounter (Signed)
Yes, needs to be seen soon if sugars are low

## 2012-12-31 ENCOUNTER — Ambulatory Visit (INDEPENDENT_AMBULATORY_CARE_PROVIDER_SITE_OTHER): Payer: Medicare Other | Admitting: Endocrinology

## 2012-12-31 ENCOUNTER — Encounter: Payer: Self-pay | Admitting: Endocrinology

## 2012-12-31 VITALS — BP 128/48 | HR 70 | Temp 98.3°F | Resp 12 | Ht 74.0 in | Wt 140.8 lb

## 2012-12-31 DIAGNOSIS — E039 Hypothyroidism, unspecified: Secondary | ICD-10-CM | POA: Insufficient documentation

## 2012-12-31 DIAGNOSIS — E1065 Type 1 diabetes mellitus with hyperglycemia: Secondary | ICD-10-CM

## 2012-12-31 DIAGNOSIS — I10 Essential (primary) hypertension: Secondary | ICD-10-CM

## 2012-12-31 NOTE — Patient Instructions (Signed)
If am sugar is < 110 often the reduce pm Lantus to 6; Use 5-8 Novolog at supper

## 2012-12-31 NOTE — Progress Notes (Signed)
Patient ID: Marcus [name], male   DOB: 05-Oct-1955, 57 y.o.   MRN: [id number]  Marcus [name] is an 57 y.o. male.   Reason for Appointment: Diabetes follow-up   History of Present Illness   Diagnosis: Type 1 DIABETES MELITUS, date of diagnosis: age 37    Previous history: The patient has poorly controlled diabetes for several years with labile blood sugars and difficulty with control. He is not capable of doing self-management and is being cared for by his companion. He also has a history of severe hypoglycemia with hypoglycemic unawareness and this prevents any attempts to have good control  Recent history:     Insulin regimen: Lantus 13 units in the morning and 8 in the evening; Novolog 4-6 units after meals, mostly at supper Proper timing of medications in relation to meals:  taking after eating and adjusted based on amount of food eaten  His caretaker says that he has not been getting blood sugar checked regularly because of personal issues and recently has minimal glucose monitoring. Previously he has had relatively good readings in the morning and no recent hypoglycemia He may occasionally have relatively low blood sugar after coming back from dialysis but apparently he is getting a sandwich to eat during his dialysis which take 6 hours. He is usually not getting any coverage for his lunch as his caretaker is afraid of causing hypoglycemia  Also he is getting his Lantus insulin after dialysis but in the mornings on the other days   Compliance with suppertime NovoLog is also inadequate and his caretaker refuses to give more than 6 units of insulin for fear of nocturnal hypoglycemia and blood sugars are often high in the evening       Monitors blood glucose: Once a day.    Glucometer: ? Accu-Chek         Blood Glucose readings from recall: Fasting readings not recalled, sometimes 200. Variable after supper, frequently over 300 Hypoglycemia frequency: rare, 59 post  dialysis; no other  recent hyperglycemia    Meals: 3 meals per day.           The last HbgA1c report is not available  Wt Readings from Last 3 Encounters:  12/31/12 140 lb 12.8 oz (63.866 kg)  04/18/12 147 lb 4.8 oz (66.815 kg)  03/28/12 145 lb (65.772 kg)       Medication List       This list is accurate as of: 12/31/12 10:23 AM.  Always use your most recent med list.               amLODipine 10 MG tablet  Commonly known as:  NORVASC  Take 10 mg by mouth daily.     aspirin EC 81 MG tablet  Take 81 mg by mouth daily.     calcium acetate 667 MG tablet  Commonly known as:  PHOSLO     cloNIDine 0.1 MG tablet  Commonly known as:  CATAPRES  Take 3 tablets (0.3 mg total) by mouth 3 (three) times daily. Hold on Hemodialysis days.     docusate sodium 100 MG capsule  Commonly known as:  COLACE  Take 200 mg by mouth daily.     EASY TOUCH INSULIN SYRINGE 31G X 5/16" 0.5 ML Misc  Generic drug:  Insulin Syringe-Needle U-100     epoetin alfa 2000 UNIT/ML injection  Commonly known as:  EPOGEN,PROCRIT  Inject 12,400 Units into the skin every Monday, Wednesday, and Friday with hemodialysis.  feeding supplement (NEPRO CARB STEADY) Liqd  Take 237 mLs by mouth 3 (three) times daily as needed.     FLUoxetine 10 MG tablet  Commonly known as:  PROZAC  Take 5 mg by mouth at bedtime.     heparin 1000 unit/mL Soln injection  2,400 Units by Dialysis route every Monday, Wednesday, and Friday with hemodialysis. 2400 Units = 2.4 mL     HYDROcodone-acetaminophen 10-325 MG per tablet  Commonly known as:  NORCO  Take 1 tablet by mouth every 6 (six) hours as needed. For pain     insulin aspart 100 UNIT/ML injection  Commonly known as:  novoLOG  Inject 3-5 Units into the skin 3 (three) times daily before meals. Inject 3 units after breakfast, 4 units after lunch, and 5 units after supper.     insulin glargine 100 UNIT/ML injection  Commonly known as:  LANTUS  Inject 8-13 Units into the skin 2 (two)  times daily. Inject 13 units in the morning, and 8 units in the evening.     latanoprost 0.005 % ophthalmic solution  Commonly known as:  XALATAN  Place 1 drop into both eyes at bedtime.     levothyroxine 125 MCG tablet  Commonly known as:  SYNTHROID, LEVOTHROID  Take 125 mcg by mouth daily.     metoprolol 50 MG tablet  Commonly known as:  LOPRESSOR  Take 75 mg by mouth 2 (two) times daily. Hold morning dose on dialysis days     omeprazole 20 MG capsule  Commonly known as:  PRILOSEC  Take 20 mg by mouth daily.     paricalcitol 2 MCG/ML injection  Commonly known as:  ZEMPLAR  Inject 2 mcg into the vein every Monday, Wednesday, and Friday with hemodialysis.     pravastatin 10 MG tablet  Commonly known as:  PRAVACHOL  Take 10 mg by mouth at bedtime.     PROAIR HFA 108 (90 BASE) MCG/ACT inhaler  Generic drug:  albuterol     promethazine 25 MG tablet  Commonly known as:  PHENERGAN  Take 25 mg by mouth every 6 (six) hours as needed. For nausea     sevelamer carbonate 800 MG tablet  Commonly known as:  RENVELA     temazepam 15 MG capsule  Commonly known as:  RESTORIL  Take 15 mg by mouth at bedtime.     warfarin 1 MG tablet  Commonly known as:  COUMADIN  Take 5 mg by mouth daily. Take with coumadin 5mg  tablet     warfarin 5 MG tablet  Commonly known as:  COUMADIN  Take 5 mg by mouth daily. Take with coumadin 1mg  tablets        Allergies:  Allergies  Allergen Reactions  . Penicillins     Past Medical History  Diagnosis Date  . ESRD (end stage renal disease) on dialysis     uncertain cause/duration, prob DM  . Hypothyroidism   . Hyperlipidemia   . CVA (cerebrovascular accident)     hx CVA x 2  . GI bleed     2006, mallory-weiss tear  . Pulmonary edema     nov 2011, dry wt decreased  . Goiter     by CT Apr 2013  . Depression   . Cocaine abuse     past history  . Tobacco abuse     history of  . GERD (gastroesophageal reflux disease)   . Cataract   .  Insomnia   . Chronic pain   .  HTN (hypertension)     sees Dr. Ardelle Park, Rosalita Levan  . Diabetes mellitus     Sees Dr. Reather Littler [phone]  . DKA (diabetic ketoacidosis)     severe, Dec 2011  . Neuromuscular disorder     diabetic neuropathy  . Occlusion of carotid stent     Per patients nursing aid    Past Surgical History  Procedure Laterality Date  . Av fistula placement  03/25/2008    right forearm  . Dyalisis catheter  04/01/2004    diatek, right jugular vein  . Av fistula placement  04/23/2012    Procedure: INSERTION OF ARTERIOVENOUS (AV) GORE-TEX GRAFT THIGH;  Surgeon: Sherren Kerns, MD;  Location: [facility] OR;  Service: Vascular;  Laterality: Left;    No family history on file.  Social History:  reports that he has been smoking Cigarettes.  He has been smoking about 0.50 packs per day. He has never used smokeless tobacco. He reports that he does not drink alcohol or use illicit drugs.  Review of Systems:  Hypertension: Currently managed by nephrologist, has had long-standing hypertension treated with amlodipine, metoprolol and clonidine     Lipids: Recent levels not available, currently on low-dose pravastatin only  He has had long-standing primary hypothyroidism with history of large goiter. No recent labs available  He has been on dialysis for ESRD for several years      Examination:   BP 128/48  Pulse 70  Temp(Src) 98.3 F (36.8 C)  Resp 12  Ht 6\' 2"  (1.88 m)  Wt 140 lb 12.8 oz (63.866 kg)  BMI 18.07 kg/m2  SpO2 99%  Body mass index is 18.07 kg/(m^2).    ASSESSMENT/ PLAN::   Diabetes type 1   Blood glucose control is difficult to assess because of lack of home glucose monitoring and no labs available today He has had stress and personal issues preventing compliance and not clear if he has been taking his insulin consistently His mealtime coverage is usually inadequate but often no coverage for lunch and inadequate coverage at dinner time for fear of  hypoglycemia overnight Even though this was discussed in detail with the caretaker that evening NovoLog will not cause overnight hypoglycemia and the fasting readings will be determined by Lantus doses only she is refusing to change her usual regimen She can continue to give the insulin right after eating since his appetite is variable Not capable of carbohydrate counting and will continue empiric doses Will need to review blood sugar monitoring once this is restarted with meter download  History of multiple diabetic complications including neuropathy and nephropathy  HYPOTHYROIDISM TSH will be checked and dosage adjusted as needed Hypertension: Good control today  Counseling time over 50% of today's 25 minute visit  Hazley Dezeeuw 12/31/2012, 10:23 AM   Addendum: Unable to draw his labs today because of poor venous access

## 2013-01-01 ENCOUNTER — Telehealth: Payer: Self-pay | Admitting: Endocrinology

## 2013-01-01 NOTE — Telephone Encounter (Signed)
Need to have his labs drawn at the dialysis center, please fax order of pending labs

## 2013-01-02 DIAGNOSIS — I10 Essential (primary) hypertension: Secondary | ICD-10-CM | POA: Insufficient documentation

## 2013-01-15 ENCOUNTER — Encounter (INDEPENDENT_AMBULATORY_CARE_PROVIDER_SITE_OTHER): Payer: Medicare Other | Admitting: Surgery

## 2013-01-16 ENCOUNTER — Encounter (INDEPENDENT_AMBULATORY_CARE_PROVIDER_SITE_OTHER): Payer: Self-pay | Admitting: Surgery

## 2013-01-16 ENCOUNTER — Ambulatory Visit (INDEPENDENT_AMBULATORY_CARE_PROVIDER_SITE_OTHER): Payer: Medicare Other | Admitting: Surgery

## 2013-01-16 VITALS — BP 133/88 | HR 70 | Temp 98.1°F | Resp 12 | Ht 72.0 in | Wt 136.6 lb

## 2013-01-16 DIAGNOSIS — E049 Nontoxic goiter, unspecified: Secondary | ICD-10-CM | POA: Insufficient documentation

## 2013-01-16 NOTE — Progress Notes (Signed)
General Surgery - [facility]  Chief Complaint  Patient presents with  . New Evaluation    thyroid goiter - referral from Rupa Patel, NP, and Imran Haque, MD    HISTORY: The patient is a 57-year-old male referred by his primary care physician for evaluation of thyroid goiter with compressive symptoms. Patient has had a long-standing history of thyroid goiter which has gradually increased in size. He is currently taking Synthroid 112 mcg daily. Patient had an ultrasound which demonstrates a markedly enlarged thyroid gland with each lobe measuring at least 7 cm in greatest dimension. It extends to the sternal notch and thoracic inlet. There is extrinsic compression on the trachea. There are no discrete or dominant masses or nodules identified.  Patient complains of dysphagia and occasional choking sensation. He denies pain. He does note shortness of breath and sleeves were 3 or 4 pillows behind him at night.  There is no family history of thyroid disease and specifically no history of thyroid cancer. There is no family history of other endocrine neoplasm.  Past Medical History  Diagnosis Date  . ESRD (end stage renal disease) on dialysis     uncertain cause/duration, prob DM  . Hypothyroidism   . Hyperlipidemia   . CVA (cerebrovascular accident)     hx CVA x 2  . GI bleed     2006, mallory-weiss tear  . Pulmonary edema     nov 2011, dry wt decreased  . Goiter     by CT Apr 2013  . Depression   . Cocaine abuse     past history  . Tobacco abuse     history of  . GERD (gastroesophageal reflux disease)   . Cataract   . Insomnia   . Chronic pain   . HTN (hypertension)     sees Dr. Haque, [place]  . Diabetes mellitus     Sees Dr. Ajay Kumar [phone]  . DKA (diabetic ketoacidosis)     severe, Dec 2011  . Neuromuscular disorder     diabetic neuropathy  . Occlusion of carotid stent     Per patients nursing aid    Current Outpatient Prescriptions   Medication Sig Dispense Refill  . amLODipine (NORVASC) 10 MG tablet Take 10 mg by mouth daily.      . aspirin EC 81 MG tablet Take 81 mg by mouth daily.      . calcium acetate (PHOSLO) 667 MG tablet       . cloNIDine (CATAPRES) 0.1 MG tablet Take 3 tablets (0.3 mg total) by mouth 3 (three) times daily. Hold on Hemodialysis days.  60 tablet  0  . docusate sodium (COLACE) 100 MG capsule Take 200 mg by mouth daily.      . EASY TOUCH INSULIN SYRINGE 31G X 5/16" 0.5 ML MISC       . epoetin alfa (EPOGEN,PROCRIT) 2000 UNIT/ML injection Inject 12,400 Units into the skin every Monday, Wednesday, and Friday with hemodialysis.      . FLUoxetine (PROZAC) 10 MG tablet Take 5 mg by mouth at bedtime.      . heparin 1000 unit/mL SOLN injection 2,400 Units by Dialysis route every Monday, Wednesday, and Friday with hemodialysis. 2400 Units = 2.4 mL      . HYDROcodone-acetaminophen (NORCO) 10-325 MG per tablet Take 1 tablet by mouth every 6 (six) hours as needed. For pain  30 tablet  0  . insulin aspart (NOVOLOG) 100 UNIT/ML injection Inject 3-5 Units into the skin   3 (three) times daily before meals. Inject 3 units after breakfast, 4 units after lunch, and 5 units after supper.      . insulin glargine (LANTUS) 100 UNIT/ML injection Inject 8-13 Units into the skin 2 (two) times daily. Inject 13 units in the morning, and 8 units in the evening.      . latanoprost (XALATAN) 0.005 % ophthalmic solution Place 1 drop into both eyes at bedtime.      . levothyroxine (SYNTHROID, LEVOTHROID) 125 MCG tablet Take 125 mcg by mouth daily.      . metoprolol (LOPRESSOR) 50 MG tablet Take 75 mg by mouth 2 (two) times daily. Hold morning dose on dialysis days      . Nutritional Supplements (FEEDING SUPPLEMENT, NEPRO CARB STEADY,) LIQD Take 237 mLs by mouth 3 (three) times daily as needed.      . omeprazole (PRILOSEC) 20 MG capsule Take 20 mg by mouth daily.      . paricalcitol (ZEMPLAR) 2 MCG/ML injection Inject 2 mcg into the vein  every Monday, Wednesday, and Friday with hemodialysis.      . pravastatin (PRAVACHOL) 10 MG tablet Take 10 mg by mouth at bedtime.      . PROAIR HFA 108 (90 BASE) MCG/ACT inhaler       . promethazine (PHENERGAN) 25 MG tablet Take 25 mg by mouth every 6 (six) hours as needed. For nausea      . sevelamer carbonate (RENVELA) 800 MG tablet       . temazepam (RESTORIL) 15 MG capsule Take 15 mg by mouth at bedtime.      . warfarin (COUMADIN) 1 MG tablet Take 5 mg by mouth daily. Take with coumadin 5mg tablet      . warfarin (COUMADIN) 5 MG tablet Take 5 mg by mouth daily. Take with coumadin 1mg tablets       No current facility-administered medications for this visit.    Allergies  Allergen Reactions  . Penicillins     History reviewed. No pertinent family history.  History   Social History  . Marital Status: Married    Spouse Name: N/A    Number of Children: N/A  . Years of Education: N/A   Social History Main Topics  . Smoking status: Current Every Day Smoker -- 0.50 packs/day    Types: Cigarettes  . Smokeless tobacco: Never Used     Comment: pt states that he has been thinking about giving it up  . Alcohol Use: No  . Drug Use: No  . Sexual Activity: None   Other Topics Concern  . None   Social History Narrative  . None    REVIEW OF SYSTEMS - PERTINENT POSITIVES ONLY: Moderate dysphagia to both solids and liquids. Mild to moderate shortness of breath. Sleeps in the upright position. Denies discomfort.  EXAM: Filed Vitals:   01/16/13 1346  BP: 133/88  Pulse: 70  Temp: 98.1 F (36.7 C)  Resp: 12    HEENT: normocephalic; pupils equal and reactive; sclerae clear; dentition poor; mucous membranes moist; partial paralysis left face NECK:  Large soft thyroid goiter, relatively symmetric, without discrete or dominant nodule, nontender; symmetric on extension; no palpable anterior or posterior cervical lymphadenopathy; no supraclavicular masses; no tenderness CHEST: clear  to auscultation bilaterally without rales, rhonchi, or wheezes CARDIAC: regular rate and rhythm without significant murmur; peripheral pulses are full EXT:  non-tender without edema; no deformity; previous access procedures bilateral upper extremities; current dialysis graft in the left anterior thigh   NEURO: no sign of tremor   LABORATORY RESULTS: See [facility] (CHL-Epic) for most recent results  RADIOLOGY RESULTS: See [facility] (CHL-Epic) for most recent results  IMPRESSION: #1 large thyroid goiter with compressive symptoms #2 hypertension #3 insulin-dependent diabetes #4 history of peripheral vascular disease  PLAN: I discussed with the patient and his medical aid the above findings. We discussed total thyroidectomy for management of thyroid goiter with compressive symptoms. We discussed the fact that there was no absolute indication to have surgery but certainly he had several relative indications for thyroid surgery. We discussed the risk and benefits of the procedure. I told him there was approximately a 5% risk of injury to recurrent laryngeal nerves. We discussed potential injury to parathyroid glands. We discussed the hospital stay to be anticipated. We discussed the need for dialysis while he was at the hospital. We discussed the need for lifelong thyroid hormone replacement to be managed by his primary care physician. Patient understands and wishes to proceed with surgery. We will find a time convenient for him in the near future.  The risks and benefits of the procedure have been discussed at length with the patient.  The patient understands the proposed procedure, potential alternative treatments, and the course of recovery to be expected.  All of the patient's questions have been answered at this time.  The patient wishes to proceed with surgery.  Dynastie Knoop M. Caylin Raby, MD, FACS General & Endocrine Surgery [facility]  Primary Care Physician: [name],  [name], MD   

## 2013-01-16 NOTE — Patient Instructions (Signed)
Thyroidectomy °Thyroidectomy is the removal of part or all of your thyroid gland. Your thyroid gland is a butterfly-shaped gland at the base of your neck. It produces a substance called thyroid hormone, which regulates the physical and chemical processes that keep your body functioning and make energy available to your body (metabolism). °The amount of thyroid gland tissue that is removed during a thyroidectomy depends on the reason for the procedure. Typically, if only a part of your gland is removed, enough thyroid gland tissue remains to maintain normal function. If your entire thyroid gland is removed or if the amount of thyroid gland tissue remaining is inadequate to maintain normal function, you will need life-long treatment with thyroid hormone on a daily basis. °Thyroidectomy maybe performed when you have the following conditions: °· Thyroid nodules. These are small, abnormal collections of tissue that form inside the thyroid gland. If these nodules begin to enlarge at a rapid rate, a sample of tissue from the nodule is taken through a needle and examined (needle biopsy). This is done to determine if the nodules are cancerous. Depending on the outcome of this exam, thyroidectomy may be necessary. °· Thyroid cancer. °· Goiter, which is an enlarged thyroid gland. All or part of the thyroid gland may be removed if the gland has become so large that it causes difficulty breathing or swallowing. °· Hyperthyroidism. This is when the thyroid gland produces too much thyroid hormone. Hypothyroidism can cause symptoms of fluctuating weight, intolerance to heat, irritability, shortness of breath, and chest pain. °LET YOUR CAREGIVER KNOW ABOUT:  °· Allergies to food or medicine. °· Medicines that you are taking, including vitamins, herbs, eyedrops, over-the-counter medicines, and creams. °· Previous problems you have had with anesthetics or numbing medicines. °· History of bleeding problems or blood clots. °· Previous  surgeries you have had. °· Other health problems, including diabetes and kidney problems, you have had. °· Possibility of pregnancy, if this applies. °BEFORE THE PROCEDURE  °· Do not eat or drink anything, including water, for at least 6 hours before the procedure. °· Ask your caregiver whether you should stop taking certain medicines before the day of the procedure. °PROCEDURE  °There are different ways that thyroidectomy is performed. For each type, you will be given a medicine to make you sleep (general anesthetic). The three main types of thyroidectomy are listed as follows: °· Conventional thyroidectomy A cut (incision) in the center portion of your lower neck is made with a scalpel. Muscles below your skin are separated to gain access to your thyroid gland. Your thyroid gland is dissected from your windpipe (trachea). Often a drain is placed at the incision site to drain any blood that accumulates under the skin after the procedure. This drain will be removed before you go home. The wound from the incision should heal within 2 weeks. °· Endoscopic thyroidectomy Small incisions are made in your lower neck. A small instrument (endoscope) is inserted under your skin at the incision sites. The endoscope used for thyroidectomy consists of 2 flexible tubes. Inside one of the tubes is a video camera that is used to guide the surgeon. Tools to remove the thyroid gland, including a tool to cut the gland (dissectors) and a suction device, are inserted through the other tube. The surgeon uses the dissectors to dissect the thyroid gland from the trachea and remove it. °· Robotic thyroidectomy This procedure allows your thyroid gland to be removed through incisions in your armpit, your chest, or high in your   neck. Instruments similar to endoscopes provide a 3-dimensional picture of the surgical site. Dissecting instruments are controlled by devices similar to joysticks. These devices allow more accurate manipulation of the  instruments. After the blood supply to the gland is removed, the gland is cut into several pieces and removed through the incisions. RISKS AND COMPLICATIONS Complications associated with thyroidectomy are rare, but they can occur. Possible complications include:  A decrease in parathyroid hormone levels (hypoparathyroidism) Your parathyroid glands are located close behind your thyroid gland. They are responsible for maintaining calcium levels inthe body. If they are damaged or removed, levels of calcium in the blood become low and nerves become irritable, which can cause muscle spasms. Medicines are available to treat this.  Bacterial infection This can often be treated with medicines that kill bacteria (antibiotics).  Damage to your voice box nerves This could cause hoarseness or complete loss of voice.  Bleeding or airway obstruction. AFTER THE PROCEDURE   You will rest in the recovery room as you wake up.  When you first wake up, your throat may feel slightly sore.  You will not be allowed to eat or drink until instructed otherwise.  You will be taken to your hospital room. You will usually stay at the hospital for 1 or 2 nights.  If a drain is placed during the procedure, it usually is removed the next day.  You may have some mild neck pain.  Your voice may be weak. This usually is temporary. Document Released: 09/27/2000 Document Revised: 06/26/2011 Document Reviewed: 07/06/2010 [facility] Patient Information 2014 [place], [place].

## 2013-01-23 ENCOUNTER — Encounter (HOSPITAL_COMMUNITY)
Admission: RE | Admit: 2013-01-23 | Discharge: 2013-01-23 | Disposition: A | Discharge status: Home / Self Care | Payer: Medicare Other | Source: Ambulatory Visit | Attending: Surgery | Admitting: Surgery

## 2013-01-23 ENCOUNTER — Encounter (HOSPITAL_COMMUNITY)
Admission: RE | Admit: 2013-01-23 | Discharge: 2013-01-23 | Disposition: A | Discharge status: Home / Self Care | Payer: Medicare Other | Source: Ambulatory Visit | Attending: Anesthesiology | Admitting: Anesthesiology

## 2013-01-23 ENCOUNTER — Encounter (HOSPITAL_COMMUNITY): Payer: Self-pay | Admitting: Pharmacy Technician

## 2013-01-23 ENCOUNTER — Encounter (HOSPITAL_COMMUNITY): Payer: Self-pay

## 2013-01-23 DIAGNOSIS — Z01810 Encounter for preprocedural cardiovascular examination: Secondary | ICD-10-CM | POA: Insufficient documentation

## 2013-01-23 DIAGNOSIS — Z01818 Encounter for other preprocedural examination: Secondary | ICD-10-CM | POA: Insufficient documentation

## 2013-01-23 DIAGNOSIS — Z01812 Encounter for preprocedural laboratory examination: Secondary | ICD-10-CM | POA: Insufficient documentation

## 2013-01-23 HISTORY — DX: Pneumonia, unspecified organism: J18.9

## 2013-01-23 HISTORY — DX: Personal history of other medical treatment: Z92.89

## 2013-01-23 HISTORY — DX: Dependence on supplemental oxygen: Z99.81

## 2013-01-23 NOTE — Progress Notes (Signed)
01/23/13 1024  OBSTRUCTIVE SLEEP APNEA  Have you ever been diagnosed with sleep apnea through a sleep study? No  Do you snore loudly (loud enough to be heard through closed doors)?  1  Do you often feel tired, fatigued, or sleepy during the daytime? 0  Has anyone observed you stop breathing during your sleep? 1  Do you have, or are you being treated for high blood pressure? 1  BMI more than 35 kg/m2? 0  Age over 57 years old? 1  Gender: 1  Obstructive Sleep Apnea Score 5  Score 4 or greater  Results sent to PCP

## 2013-01-23 NOTE — Pre-Procedure Instructions (Signed)
Marcus [name]  01/23/2013   Your procedure is scheduled on:  Tuesday, October 14th  Report to Main Entrance "A" at 0715 AM.  Call this number if you have problems the morning of surgery: [phone]   Remember:   Do not eat food or drink liquids after midnight.   Take these medicines the morning of surgery with A SIP OF WATER: Norvasc, Clonidine, Lopressor, Synthroid, Prilosec. Hydrocodone if needed  Do NOT take any insulin or diabetes medication on morning of surgery.  Stop taking coumadin, aspirin, ibuprofen as of today.   Do not wear jewelry.  Do not wear lotions, powders, or perfumes. You may wear deodorant.  Do not shave 48 hours prior to surgery. Men may shave face and neck.  Do not bring valuables to the hospital.  [facility] is not responsible   for any belongings or valuables.               Contacts, dentures or bridgework may not be worn into surgery.  Leave suitcase in the car. After surgery it may be brought to your room.  For patients admitted to the hospital, discharge time is determined by your   treatment team.    Special Instructions: Shower using CHG 2 nights before surgery and the night before surgery.  If you shower the day of surgery use CHG.  Use special wash - you have one bottle of CHG for all showers.  You should use approximately 1/3 of the bottle for each shower.   Please read over the following fact sheets that you were given: Pain Booklet, Coughing and Deep Breathing and Surgical Site Infection Prevention

## 2013-01-23 NOTE — Progress Notes (Signed)
Primary physician - dr. Ardelle Park in [place] Does not have a cardiologist No recent cardiac testing stress from 2010 in epic

## 2013-01-27 MED ORDER — CIPROFLOXACIN IN D5W 400 MG/200ML IV SOLN
400.0000 mg | INTRAVENOUS | Status: AC
  Administered 2013-01-28: 400 mg via INTRAVENOUS
  Filled 2013-01-27: qty 200

## 2013-01-28 ENCOUNTER — Observation Stay (HOSPITAL_COMMUNITY)
Admission: RE | Admit: 2013-01-28 | Discharge: 2013-01-29 | Disposition: A | Discharge status: Home / Self Care | Payer: Medicare Other | Source: Ambulatory Visit | Attending: Surgery | Admitting: Surgery

## 2013-01-28 ENCOUNTER — Encounter (HOSPITAL_COMMUNITY)
Admission: RE | Disposition: A | Discharge status: Home / Self Care | Payer: Self-pay | Source: Ambulatory Visit | Attending: Surgery

## 2013-01-28 ENCOUNTER — Ambulatory Visit (HOSPITAL_COMMUNITY): Payer: Medicare Other | Admitting: Anesthesiology

## 2013-01-28 ENCOUNTER — Encounter (HOSPITAL_COMMUNITY): Payer: Self-pay | Admitting: *Deleted

## 2013-01-28 ENCOUNTER — Encounter (HOSPITAL_COMMUNITY): Payer: Medicare Other | Admitting: Anesthesiology

## 2013-01-28 DIAGNOSIS — Z538 Procedure and treatment not carried out for other reasons: Secondary | ICD-10-CM | POA: Insufficient documentation

## 2013-01-28 DIAGNOSIS — I120 Hypertensive chronic kidney disease with stage 5 chronic kidney disease or end stage renal disease: Secondary | ICD-10-CM | POA: Insufficient documentation

## 2013-01-28 DIAGNOSIS — Z992 Dependence on renal dialysis: Secondary | ICD-10-CM | POA: Insufficient documentation

## 2013-01-28 DIAGNOSIS — E063 Autoimmune thyroiditis: Secondary | ICD-10-CM

## 2013-01-28 DIAGNOSIS — E119 Type 2 diabetes mellitus without complications: Secondary | ICD-10-CM | POA: Insufficient documentation

## 2013-01-28 DIAGNOSIS — N186 End stage renal disease: Secondary | ICD-10-CM | POA: Insufficient documentation

## 2013-01-28 DIAGNOSIS — K219 Gastro-esophageal reflux disease without esophagitis: Secondary | ICD-10-CM | POA: Insufficient documentation

## 2013-01-28 DIAGNOSIS — J4489 Other specified chronic obstructive pulmonary disease: Secondary | ICD-10-CM | POA: Insufficient documentation

## 2013-01-28 DIAGNOSIS — Z794 Long term (current) use of insulin: Secondary | ICD-10-CM | POA: Insufficient documentation

## 2013-01-28 DIAGNOSIS — F17200 Nicotine dependence, unspecified, uncomplicated: Secondary | ICD-10-CM | POA: Insufficient documentation

## 2013-01-28 DIAGNOSIS — E049 Nontoxic goiter, unspecified: Principal | ICD-10-CM | POA: Insufficient documentation

## 2013-01-28 DIAGNOSIS — J449 Chronic obstructive pulmonary disease, unspecified: Secondary | ICD-10-CM | POA: Insufficient documentation

## 2013-01-28 DIAGNOSIS — Z9981 Dependence on supplemental oxygen: Secondary | ICD-10-CM | POA: Insufficient documentation

## 2013-01-28 DIAGNOSIS — F1410 Cocaine abuse, uncomplicated: Secondary | ICD-10-CM | POA: Insufficient documentation

## 2013-01-28 HISTORY — PX: MASS BIOPSY: SHX5445

## 2013-01-28 HISTORY — PX: LYMPH NODE BIOPSY: SHX201

## 2013-01-28 HISTORY — PX: THYROID SURGERY: SHX805

## 2013-01-28 LAB — POCT I-STAT 4, (NA,K, GLUC, HGB,HCT)
Hemoglobin: 11.9 g/dL — ABNORMAL LOW (ref 13.0–17.0)
Potassium: 4.3 mEq/L (ref 3.5–5.1)

## 2013-01-28 LAB — GLUCOSE, CAPILLARY
Glucose-Capillary: 109 mg/dL — ABNORMAL HIGH (ref 70–99)
Glucose-Capillary: 67 mg/dL — ABNORMAL LOW (ref 70–99)
Glucose-Capillary: 105 mg/dL — ABNORMAL HIGH (ref 70–99)
Glucose-Capillary: 94 mg/dL (ref 70–99)
Glucose-Capillary: 248 mg/dL — ABNORMAL HIGH (ref 70–99)

## 2013-01-28 SURGERY — LYMPH NODE BIOPSY
Anesthesia: General | Site: Neck | Laterality: Right | Wound class: Clean

## 2013-01-28 MED ORDER — FENTANYL CITRATE 0.05 MG/ML IJ SOLN
INTRAMUSCULAR | Status: DC | PRN
  Administered 2013-01-28: 50 ug via INTRAVENOUS

## 2013-01-28 MED ORDER — SODIUM CHLORIDE 0.9 % IV SOLN
INTRAVENOUS | Status: DC
  Administered 2013-01-28 (×2): via INTRAVENOUS

## 2013-01-28 MED ORDER — TEMAZEPAM 15 MG PO CAPS: 15.0000 mg | ORAL_CAPSULE | ORAL | Status: DC

## 2013-01-28 MED ORDER — INSULIN ASPART 100 UNIT/ML ~~LOC~~ SOLN: 5.0000 [IU] | Freq: Every day | SUBCUTANEOUS | Status: DC

## 2013-01-28 MED ORDER — SUCCINYLCHOLINE CHLORIDE 20 MG/ML IJ SOLN
INTRAMUSCULAR | Status: DC | PRN
  Administered 2013-01-28: 120 mg via INTRAVENOUS

## 2013-01-28 MED ORDER — INSULIN GLARGINE 100 UNIT/ML ~~LOC~~ SOLN
13.0000 [IU] | Freq: Every day | SUBCUTANEOUS | Status: DC
  Administered 2013-01-29: 13 [IU] via SUBCUTANEOUS
  Filled 2013-01-28: qty 0.13

## 2013-01-28 MED ORDER — HYDROCODONE-ACETAMINOPHEN 5-325 MG PO TABS
1.0000 | ORAL_TABLET | ORAL | Status: DC | PRN
  Administered 2013-01-28 (×2): 1 via ORAL
  Administered 2013-01-29 (×2): 2 via ORAL
  Filled 2013-01-28 (×2): qty 2
  Filled 2013-01-28 (×2): qty 1

## 2013-01-28 MED ORDER — DEXTROSE 50 % IV SOLN
25.0000 mL | Freq: Once | INTRAVENOUS | Status: AC | PRN
  Filled 2013-01-28: qty 50

## 2013-01-28 MED ORDER — HYDROMORPHONE HCL PF 1 MG/ML IJ SOLN: 1.0000 mg | INTRAMUSCULAR | Status: DC | PRN

## 2013-01-28 MED ORDER — SODIUM CHLORIDE 0.9 % IV SOLN: INTRAVENOUS | Status: DC

## 2013-01-28 MED ORDER — LEVOTHYROXINE SODIUM 112 MCG PO TABS
112.0000 ug | ORAL_TABLET | Freq: Every day | ORAL | Status: DC
  Administered 2013-01-29: 112 ug via ORAL
  Filled 2013-01-28: qty 1

## 2013-01-28 MED ORDER — DEXTROSE 50 % IV SOLN
INTRAVENOUS | Status: AC
  Filled 2013-01-28: qty 50

## 2013-01-28 MED ORDER — INSULIN GLARGINE 100 UNIT/ML ~~LOC~~ SOLN: 9.0000 [IU] | Freq: Two times a day (BID) | SUBCUTANEOUS | Status: DC

## 2013-01-28 MED ORDER — ROCURONIUM BROMIDE 100 MG/10ML IV SOLN
INTRAVENOUS | Status: DC | PRN
  Administered 2013-01-28: 40 mg via INTRAVENOUS

## 2013-01-28 MED ORDER — INSULIN ASPART 100 UNIT/ML ~~LOC~~ SOLN: 4.0000 [IU] | Freq: Every day | SUBCUTANEOUS | Status: DC

## 2013-01-28 MED ORDER — ONDANSETRON HCL 4 MG/2ML IJ SOLN: 4.0000 mg | Freq: Four times a day (QID) | INTRAMUSCULAR | Status: DC | PRN

## 2013-01-28 MED ORDER — INSULIN ASPART 100 UNIT/ML ~~LOC~~ SOLN: 4.0000 [IU] | Freq: Three times a day (TID) | SUBCUTANEOUS | Status: DC

## 2013-01-28 MED ORDER — PANTOPRAZOLE SODIUM 40 MG PO TBEC
40.0000 mg | DELAYED_RELEASE_TABLET | Freq: Every day | ORAL | Status: DC
  Administered 2013-01-29: 40 mg via ORAL
  Filled 2013-01-28: qty 1

## 2013-01-28 MED ORDER — FLUOXETINE HCL 10 MG PO TABS
5.0000 mg | ORAL_TABLET | Freq: Every morning | ORAL | Status: DC
  Filled 2013-01-28: qty 1

## 2013-01-28 MED ORDER — CALCIUM ACETATE (PHOS BINDER) 667 MG PO TABS: 1334.0000 mg | ORAL_TABLET | Freq: Three times a day (TID) | ORAL | Status: DC

## 2013-01-28 MED ORDER — GLYCOPYRROLATE 0.2 MG/ML IJ SOLN: INTRAMUSCULAR | Status: DC | PRN

## 2013-01-28 MED ORDER — HEPARIN SODIUM (PORCINE) 1000 UNIT/ML DIALYSIS: 2400.0000 [IU] | INTRAMUSCULAR | Status: DC

## 2013-01-28 MED ORDER — FENTANYL CITRATE 0.05 MG/ML IJ SOLN: 50.0000 ug | Freq: Once | INTRAMUSCULAR | Status: DC

## 2013-01-28 MED ORDER — ASPIRIN EC 81 MG PO TBEC
81.0000 mg | DELAYED_RELEASE_TABLET | Freq: Every day | ORAL | Status: DC
  Administered 2013-01-28 – 2013-01-29 (×2): 81 mg via ORAL
  Filled 2013-01-28 (×2): qty 1

## 2013-01-28 MED ORDER — ONDANSETRON HCL 4 MG PO TABS: 4.0000 mg | ORAL_TABLET | Freq: Four times a day (QID) | ORAL | Status: DC | PRN

## 2013-01-28 MED ORDER — CLONIDINE HCL 0.1 MG PO TABS
0.1000 mg | ORAL_TABLET | Freq: Three times a day (TID) | ORAL | Status: DC
  Administered 2013-01-28 – 2013-01-29 (×3): 0.1 mg via ORAL
  Filled 2013-01-28 (×5): qty 1

## 2013-01-28 MED ORDER — METOPROLOL TARTRATE 50 MG PO TABS
75.0000 mg | ORAL_TABLET | Freq: Two times a day (BID) | ORAL | Status: DC
  Administered 2013-01-28: 75 mg via ORAL
  Filled 2013-01-28 (×8): qty 1

## 2013-01-28 MED ORDER — PROPOFOL 10 MG/ML IV BOLUS
INTRAVENOUS | Status: DC | PRN
  Administered 2013-01-28: 180 mg via INTRAVENOUS

## 2013-01-28 MED ORDER — AMLODIPINE BESYLATE 10 MG PO TABS
10.0000 mg | ORAL_TABLET | Freq: Every day | ORAL | Status: DC
  Administered 2013-01-28: 10 mg via ORAL
  Filled 2013-01-28 (×3): qty 1

## 2013-01-28 MED ORDER — INSULIN GLARGINE 100 UNIT/ML ~~LOC~~ SOLN
9.0000 [IU] | Freq: Every day | SUBCUTANEOUS | Status: DC
  Administered 2013-01-28: 9 [IU] via SUBCUTANEOUS
  Filled 2013-01-28 (×2): qty 0.09

## 2013-01-28 MED ORDER — ACETAMINOPHEN 325 MG PO TABS: 650.0000 mg | ORAL_TABLET | ORAL | Status: DC | PRN

## 2013-01-28 MED ORDER — NA FERRIC GLUC CPLX IN SUCROSE 12.5 MG/ML IV SOLN
62.5000 mg | INTRAVENOUS | Status: DC
  Administered 2013-01-29: 62.5 mg via INTRAVENOUS
  Filled 2013-01-28 (×2): qty 5

## 2013-01-28 MED ORDER — INSULIN ASPART 100 UNIT/ML ~~LOC~~ SOLN
6.0000 [IU] | Freq: Every day | SUBCUTANEOUS | Status: DC
  Administered 2013-01-28: 6 [IU] via SUBCUTANEOUS

## 2013-01-28 MED ORDER — NEOSTIGMINE METHYLSULFATE 1 MG/ML IJ SOLN
INTRAMUSCULAR | Status: DC | PRN
  Administered 2013-01-28: 2 mg via INTRAVENOUS
  Administered 2013-01-28: 3 mg via INTRAVENOUS

## 2013-01-28 MED ORDER — LIDOCAINE HCL (CARDIAC) 20 MG/ML IV SOLN
INTRAVENOUS | Status: DC | PRN
  Administered 2013-01-28: 100 mg via INTRAVENOUS

## 2013-01-28 MED ORDER — ONDANSETRON HCL 4 MG/2ML IJ SOLN
INTRAMUSCULAR | Status: DC | PRN
  Administered 2013-01-28: 4 mg via INTRAMUSCULAR

## 2013-01-28 MED ORDER — MIDAZOLAM HCL 2 MG/2ML IJ SOLN: 1.0000 mg | INTRAMUSCULAR | Status: DC | PRN

## 2013-01-28 MED ORDER — NEOSTIGMINE METHYLSULFATE 1 MG/ML IJ SOLN: INTRAMUSCULAR | Status: DC | PRN

## 2013-01-28 MED ORDER — GLYCOPYRROLATE 0.2 MG/ML IJ SOLN
INTRAMUSCULAR | Status: DC | PRN
  Administered 2013-01-28: .3 mg via INTRAVENOUS
  Administered 2013-01-28: .5 mg via INTRAVENOUS

## 2013-01-28 MED ORDER — HEMOSTATIC AGENTS (NO CHARGE) OPTIME
TOPICAL | Status: DC | PRN
  Administered 2013-01-28: 1 via TOPICAL

## 2013-01-28 MED ORDER — DEXTROSE 5 % IV SOLN
INTRAVENOUS | Status: DC | PRN
  Administered 2013-01-28: 11:00:00 via INTRAVENOUS

## 2013-01-28 MED ORDER — DOCUSATE SODIUM 100 MG PO CAPS
100.0000 mg | ORAL_CAPSULE | Freq: Two times a day (BID) | ORAL | Status: DC
  Administered 2013-01-28 – 2013-01-29 (×2): 100 mg via ORAL
  Filled 2013-01-28 (×2): qty 1

## 2013-01-28 MED ORDER — 0.9 % SODIUM CHLORIDE (POUR BTL) OPTIME
TOPICAL | Status: DC | PRN
  Administered 2013-01-28: 1000 mL

## 2013-01-28 MED ORDER — GLYCOPYRROLATE 0.2 MG/ML IJ SOLN
INTRAMUSCULAR | Status: DC | PRN
  Administered 2013-01-28: .5 mg via INTRAVENOUS
  Administered 2013-01-28: .3 mg via INTRAVENOUS

## 2013-01-28 MED ORDER — DEXTROSE 50 % IV SOLN
25.0000 mL | Freq: Once | INTRAVENOUS | Status: AC
  Administered 2013-01-28: 25 mL via INTRAVENOUS
  Filled 2013-01-28: qty 50

## 2013-01-28 MED ORDER — ALBUTEROL SULFATE HFA 108 (90 BASE) MCG/ACT IN AERS
2.0000 | INHALATION_SPRAY | RESPIRATORY_TRACT | Status: DC | PRN
  Filled 2013-01-28: qty 6.7

## 2013-01-28 MED ORDER — SEVELAMER CARBONATE 800 MG PO TABS: 800.0000 mg | ORAL_TABLET | Freq: Three times a day (TID) | ORAL | Status: DC

## 2013-01-28 SURGICAL SUPPLY — 50 items
APL SKNCLS STERI-STRIP NONHPOA (GAUZE/BANDAGES/DRESSINGS) ×2
BENZOIN TINCTURE PRP APPL 2/3 (GAUZE/BANDAGES/DRESSINGS) ×3 IMPLANT
BLADE SURG 10 STRL SS (BLADE) ×3 IMPLANT
BLADE SURG 15 STRL LF DISP TIS (BLADE) ×2 IMPLANT
BLADE SURG 15 STRL SS (BLADE) ×3
BLADE SURG ROTATE 9660 (MISCELLANEOUS) ×1 IMPLANT
CANISTER SUCTION 2500CC (MISCELLANEOUS) ×3 IMPLANT
CHLORAPREP W/TINT 10.5 ML (MISCELLANEOUS) ×3 IMPLANT
CLIP TI MEDIUM 24 (CLIP) ×3 IMPLANT
CLIP TI WIDE RED SMALL 24 (CLIP) ×3 IMPLANT
CLOTH BEACON ORANGE TIMEOUT ST (SAFETY) ×3 IMPLANT
CONT SPEC 4OZ CLIKSEAL STRL BL (MISCELLANEOUS) ×1 IMPLANT
COVER SURGICAL LIGHT HANDLE (MISCELLANEOUS) ×3 IMPLANT
CRADLE DONUT ADULT HEAD (MISCELLANEOUS) ×3 IMPLANT
DRAPE PED LAPAROTOMY (DRAPES) ×3 IMPLANT
DRAPE UTILITY 15X26 W/TAPE STR (DRAPE) ×6 IMPLANT
ELECT CAUTERY BLADE 6.4 (BLADE) ×3 IMPLANT
ELECT REM PT RETURN 9FT ADLT (ELECTROSURGICAL) ×3
ELECTRODE REM PT RTRN 9FT ADLT (ELECTROSURGICAL) ×2 IMPLANT
GAUZE SPONGE 4X4 16PLY XRAY LF (GAUZE/BANDAGES/DRESSINGS) ×3 IMPLANT
GLOVE BIO SURGEON STRL SZ7.5 (GLOVE) ×1 IMPLANT
GLOVE BIOGEL PI IND STRL 7.0 (GLOVE) IMPLANT
GLOVE BIOGEL PI IND STRL 7.5 (GLOVE) IMPLANT
GLOVE BIOGEL PI INDICATOR 7.0 (GLOVE) ×2
GLOVE BIOGEL PI INDICATOR 7.5 (GLOVE) ×1
GLOVE SURG ORTHO 8.0 STRL STRW (GLOVE) ×3 IMPLANT
GLOVE SURG SS PI 7.0 STRL IVOR (GLOVE) ×1 IMPLANT
GOWN STRL NON-REIN LRG LVL3 (GOWN DISPOSABLE) ×6 IMPLANT
GOWN STRL REIN XL XLG (GOWN DISPOSABLE) ×3 IMPLANT
HEMOSTAT SURGICEL 2X4 FIBR (HEMOSTASIS) ×2 IMPLANT
KIT BASIN OR (CUSTOM PROCEDURE TRAY) ×3 IMPLANT
KIT ROOM TURNOVER OR (KITS) ×3 IMPLANT
NS IRRIG 1000ML POUR BTL (IV SOLUTION) ×3 IMPLANT
PACK SURGICAL SETUP 50X90 (CUSTOM PROCEDURE TRAY) ×3 IMPLANT
PAD ARMBOARD 7.5X6 YLW CONV (MISCELLANEOUS) ×3 IMPLANT
PENCIL BUTTON HOLSTER BLD 10FT (ELECTRODE) ×3 IMPLANT
SHEARS HARMONIC 9CM CVD (BLADE) ×3 IMPLANT
SPECIMEN JAR MEDIUM (MISCELLANEOUS) IMPLANT
SPONGE GAUZE 4X4 12PLY (GAUZE/BANDAGES/DRESSINGS) ×3 IMPLANT
SPONGE INTESTINAL PEANUT (DISPOSABLE) ×3 IMPLANT
STRIP CLOSURE SKIN 1/2X4 (GAUZE/BANDAGES/DRESSINGS) ×3 IMPLANT
SUT MNCRL AB 4-0 PS2 18 (SUTURE) ×3 IMPLANT
SUT SILK 2 0 (SUTURE) ×3
SUT SILK 2-0 18XBRD TIE 12 (SUTURE) ×2 IMPLANT
SUT VIC AB 3-0 SH 18 (SUTURE) ×4 IMPLANT
SYR BULB 3OZ (MISCELLANEOUS) ×3 IMPLANT
TAPE CLOTH SURG 4X10 WHT LF (GAUZE/BANDAGES/DRESSINGS) ×1 IMPLANT
TOWEL OR 17X24 6PK STRL BLUE (TOWEL DISPOSABLE) ×3 IMPLANT
TOWEL OR 17X26 10 PK STRL BLUE (TOWEL DISPOSABLE) ×3 IMPLANT
TUBE CONNECTING 12X1/4 (SUCTIONS) ×3 IMPLANT

## 2013-01-28 NOTE — Progress Notes (Signed)
Report given to maryann rn as caregiver 

## 2013-01-28 NOTE — Progress Notes (Signed)
Hypoglycemic Event  CBG: 67  Treatment: D50 IV 25 mL  Symptoms: None  Follow-up CBG: Time:0815 CBG Result: 109  Possible Reasons for Event: Inadequate meal intake  Comments/MD notified:Dr Philis Pique, [facility]imb [name]  Remember to initiate Hypoglycemia Order Set & complete

## 2013-01-28 NOTE — Brief Op Note (Signed)
01/28/2013  11:57 AM  PATIENT:  Marcus [name]  57 y.o. male  PRE-OPERATIVE DIAGNOSIS:  THYROID GOITER  POST-OPERATIVE DIAGNOSIS:  THYROID GOITER  PROCEDURE:  Procedure(s): LYMPH NODE BIOPSY (Right) NECK EXPLORATION (N/A)  SURGEON:  Surgeon(s) and Role:    * Velora Heckler, MD - Primary  ASSISTANTS: Magnus Ivan, RNFA   ANESTHESIA:   general  EBL:  Total I/O In: 250 [I.V.:250] Out: 5 [Blood:5]  BLOOD ADMINISTERED:none  DRAINS: none   LOCAL MEDICATIONS USED:  NONE  SPECIMEN:  Excision  DISPOSITION OF SPECIMEN:  PATHOLOGY  COUNTS:  YES  TOURNIQUET:  * No tourniquets in log *  DICTATION: .Other Dictation: Dictation Number [id number]  PLAN OF CARE: Admit for overnight observation  PATIENT DISPOSITION:  PACU - hemodynamically stable.   Delay start of Pharmacological VTE agent (>24hrs) due to surgical blood loss or risk of bleeding: yes  Velora Heckler, MD, [facility] Office: [phone]

## 2013-01-28 NOTE — Transfer of Care (Signed)
Immediate Anesthesia Transfer of Care Note  Patient: Marcus [name]  Procedure(s) Performed: Procedure(s): LYMPH NODE BIOPSY (Right) NECK EXPLORATION (N/A)  Patient Location: PACU  Anesthesia Type:General  Level of Consciousness: awake and sedated  Airway & Oxygen Therapy: Patient Spontanous Breathing and Patient connected to nasal cannula oxygen  Post-op Assessment: Report given to PACU RN, Post -op Vital signs reviewed and stable and Patient moving all extremities  Post vital signs: Reviewed and stable  Complications: No apparent anesthesia complications

## 2013-01-28 NOTE — Anesthesia Preprocedure Evaluation (Addendum)
Anesthesia Evaluation  Patient identified by MRN, date of birth, ID band Patient awake    Reviewed: Allergy & Precautions, H&P , NPO status , Patient's Chart, lab work & pertinent test results, reviewed documented beta blocker date and time   Airway Mallampati: II TM Distance: <3 FB Neck ROM: Limited  Mouth opening: Limited Mouth Opening  Dental  (+) Edentulous Upper, Edentulous Lower and Dental Advisory Given   Pulmonary COPD oxygen dependent, Current Smoker,  O2 at night; half pack cigs/day + rhonchi   + wheezing      Cardiovascular hypertension, Pt. on home beta blockers Rhythm:Regular Rate:Normal  Neck veins appear distended   Neuro/Psych  Neuromuscular disease CVA    GI/Hepatic GERD-  Controlled and Medicated,(+)     substance abuse  cocaine use,   Endo/Other  diabetes, Well Controlled, Type 2, Insulin DependentHypothyroidism   Renal/GU ESRF and DialysisRenal diseaseM-W-Fr, last cycle of HD yesterday     Musculoskeletal   Abdominal   Peds  Hematology   Anesthesia Other Findings Large goiter  Reproductive/Obstetrics                         Anesthesia Physical Anesthesia Plan  ASA: IV  Anesthesia Plan: General   Post-op Pain Management:    Induction: Intravenous  Airway Management Planned: Oral ETT and Video Laryngoscope Planned  Additional Equipment:   Intra-op Plan:   Post-operative Plan: Extubation in OR  Informed Consent: I have reviewed the patients History and Physical, chart, labs and discussed the procedure including the risks, benefits and alternatives for the proposed anesthesia with the patient or authorized representative who has indicated his/her understanding and acceptance.     Plan Discussed with: CRNA and Surgeon  Anesthesia Plan Comments:         Anesthesia Quick Evaluation

## 2013-01-28 NOTE — Anesthesia Procedure Notes (Signed)
Procedure Name: Intubation Date/Time: 01/28/2013 10:35 AM Performed by: Darcey Nora B Pre-anesthesia Checklist: Patient identified, Emergency Drugs available, Suction available and Patient being monitored Patient Re-evaluated:Patient Re-evaluated prior to inductionOxygen Delivery Method: Circle system utilized Preoxygenation: Pre-oxygenation with 100% oxygen Intubation Type: IV induction Laryngoscope Size: Mac and 4 (McGrath) Grade View: Grade I Tube type: Oral Tube size: 7.5 mm Number of attempts: 1 Airway Equipment and Method: Stylet Placement Confirmation: ETT inserted through vocal cords under direct vision,  breath sounds checked- equal and bilateral and positive ETCO2 Secured at: 22 (cm at gum) cm Tube secured with: Tape Dental Injury: Teeth and Oropharynx as per pre-operative assessment

## 2013-01-28 NOTE — H&P (View-Only) (Signed)
General Surgery [facility]  Chief Complaint  Patient presents with  . New Evaluation    thyroid goiter - referral from Gatha Mayer, NP, and Donnel Saxon, MD    HISTORY: The patient is a 57 year old male referred by his primary care physician for evaluation of thyroid goiter with compressive symptoms. Patient has had a long-standing history of thyroid goiter which has gradually increased in size. He is currently taking Synthroid 112 mcg daily. Patient had an ultrasound which demonstrates a markedly enlarged thyroid gland with each lobe measuring at least 7 cm in greatest dimension. It extends to the sternal notch and thoracic inlet. There is extrinsic compression on the trachea. There are no discrete or dominant masses or nodules identified.  Patient complains of dysphagia and occasional choking sensation. He denies pain. He does note shortness of breath and sleeves were 3 or 4 pillows behind him at night.  There is no family history of thyroid disease and specifically no history of thyroid cancer. There is no family history of other endocrine neoplasm.  Past Medical History  Diagnosis Date  . ESRD (end stage renal disease) on dialysis     uncertain cause/duration, prob DM  . Hypothyroidism   . Hyperlipidemia   . CVA (cerebrovascular accident)     hx CVA x 2  . GI bleed     2006, mallory-weiss tear  . Pulmonary edema     nov 2011, dry wt decreased  . Goiter     by CT Apr 2013  . Depression   . Cocaine abuse     past history  . Tobacco abuse     history of  . GERD (gastroesophageal reflux disease)   . Cataract   . Insomnia   . Chronic pain   . HTN (hypertension)     sees Dr. Ardelle Park, Rosalita Levan  . Diabetes mellitus     Sees Dr. Reather Littler [phone]  . DKA (diabetic ketoacidosis)     severe, Dec 2011  . Neuromuscular disorder     diabetic neuropathy  . Occlusion of carotid stent     Per patients nursing aid    Current Outpatient Prescriptions   Medication Sig Dispense Refill  . amLODipine (NORVASC) 10 MG tablet Take 10 mg by mouth daily.      Marland Kitchen aspirin EC 81 MG tablet Take 81 mg by mouth daily.      . calcium acetate (PHOSLO) 667 MG tablet       . cloNIDine (CATAPRES) 0.1 MG tablet Take 3 tablets (0.3 mg total) by mouth 3 (three) times daily. Hold on Hemodialysis days.  60 tablet  0  . docusate sodium (COLACE) 100 MG capsule Take 200 mg by mouth daily.      Marland Kitchen EASY TOUCH INSULIN SYRINGE 31G X 5/16" 0.5 ML MISC       . epoetin alfa (EPOGEN,PROCRIT) 2000 UNIT/ML injection Inject 12,400 Units into the skin every Monday, Wednesday, and Friday with hemodialysis.      Marland Kitchen FLUoxetine (PROZAC) 10 MG tablet Take 5 mg by mouth at bedtime.      . heparin 1000 unit/mL SOLN injection 2,400 Units by Dialysis route every Monday, Wednesday, and Friday with hemodialysis. 2400 Units = 2.4 mL      . HYDROcodone-acetaminophen (NORCO) 10-325 MG per tablet Take 1 tablet by mouth every 6 (six) hours as needed. For pain  30 tablet  0  . insulin aspart (NOVOLOG) 100 UNIT/ML injection Inject 3-5 Units into the skin  3 (three) times daily before meals. Inject 3 units after breakfast, 4 units after lunch, and 5 units after supper.      . insulin glargine (LANTUS) 100 UNIT/ML injection Inject 8-13 Units into the skin 2 (two) times daily. Inject 13 units in the morning, and 8 units in the evening.      . latanoprost (XALATAN) 0.005 % ophthalmic solution Place 1 drop into both eyes at bedtime.      Marland Kitchen levothyroxine (SYNTHROID, LEVOTHROID) 125 MCG tablet Take 125 mcg by mouth daily.      . metoprolol (LOPRESSOR) 50 MG tablet Take 75 mg by mouth 2 (two) times daily. Hold morning dose on dialysis days      . Nutritional Supplements (FEEDING SUPPLEMENT, NEPRO CARB STEADY,) LIQD Take 237 mLs by mouth 3 (three) times daily as needed.      Marland Kitchen omeprazole (PRILOSEC) 20 MG capsule Take 20 mg by mouth daily.      . paricalcitol (ZEMPLAR) 2 MCG/ML injection Inject 2 mcg into the vein  every Monday, Wednesday, and Friday with hemodialysis.      Marland Kitchen pravastatin (PRAVACHOL) 10 MG tablet Take 10 mg by mouth at bedtime.      Marland Kitchen PROAIR HFA 108 (90 BASE) MCG/ACT inhaler       . promethazine (PHENERGAN) 25 MG tablet Take 25 mg by mouth every 6 (six) hours as needed. For nausea      . sevelamer carbonate (RENVELA) 800 MG tablet       . temazepam (RESTORIL) 15 MG capsule Take 15 mg by mouth at bedtime.      Marland Kitchen warfarin (COUMADIN) 1 MG tablet Take 5 mg by mouth daily. Take with coumadin 5mg  tablet      . warfarin (COUMADIN) 5 MG tablet Take 5 mg by mouth daily. Take with coumadin 1mg  tablets       No current facility-administered medications for this visit.    Allergies  Allergen Reactions  . Penicillins     History reviewed. No pertinent family history.  History   Social History  . Marital Status: Married    Spouse Name: N/A    Number of Children: N/A  . Years of Education: N/A   Social History Main Topics  . Smoking status: Current Every Day Smoker -- 0.50 packs/day    Types: Cigarettes  . Smokeless tobacco: Never Used     Comment: pt states that he has been thinking about giving it up  . Alcohol Use: No  . Drug Use: No  . Sexual Activity: None   Other Topics Concern  . None   Social History Narrative  . None    REVIEW OF SYSTEMS - PERTINENT POSITIVES ONLY: Moderate dysphagia to both solids and liquids. Mild to moderate shortness of breath. Sleeps in the upright position. Denies discomfort.  EXAM: Filed Vitals:   01/16/13 1346  BP: 133/88  Pulse: 70  Temp: 98.1 F (36.7 C)  Resp: 12    HEENT: normocephalic; pupils equal and reactive; sclerae clear; dentition poor; mucous membranes moist; partial paralysis left face NECK:  Large soft thyroid goiter, relatively symmetric, without discrete or dominant nodule, nontender; symmetric on extension; no palpable anterior or posterior cervical lymphadenopathy; no supraclavicular masses; no tenderness CHEST: clear  to auscultation bilaterally without rales, rhonchi, or wheezes CARDIAC: regular rate and rhythm without significant murmur; peripheral pulses are full EXT:  non-tender without edema; no deformity; previous access procedures bilateral upper extremities; current dialysis graft in the left anterior thigh  NEURO: no sign of tremor   LABORATORY RESULTS: See [facility] (CHL-Epic) for most recent results  RADIOLOGY RESULTS: See [facility] (CHL-Epic) for most recent results  IMPRESSION: #1 large thyroid goiter with compressive symptoms #2 hypertension #3 insulin-dependent diabetes #4 history of peripheral vascular disease  PLAN: I discussed with the patient and his medical aid the above findings. We discussed total thyroidectomy for management of thyroid goiter with compressive symptoms. We discussed the fact that there was no absolute indication to have surgery but certainly he had several relative indications for thyroid surgery. We discussed the risk and benefits of the procedure. I told him there was approximately a 5% risk of injury to recurrent laryngeal nerves. We discussed potential injury to parathyroid glands. We discussed the hospital stay to be anticipated. We discussed the need for dialysis while he was at the hospital. We discussed the need for lifelong thyroid hormone replacement to be managed by his primary care physician. Patient understands and wishes to proceed with surgery. We will find a time convenient for him in the near future.  The risks and benefits of the procedure have been discussed at length with the patient.  The patient understands the proposed procedure, potential alternative treatments, and the course of recovery to be expected.  All of the patient's questions have been answered at this time.  The patient wishes to proceed with surgery.  Velora Heckler, MD, FACS General & Endocrine Surgery [facility]  Primary Care Physician: Galvin Proffer, MD

## 2013-01-28 NOTE — Consult Note (Signed)
[facility] Renal Consultation Note    Indication for Consultation:  Management of ESRD/hemodialysis; anemia, hypertension/volume and secondary hyperparathyroidism  HPI: Marcus [name] is a 57 y.o. male ESRD patient with past medical history significant for hypertension, poorly controlled DM, CVA x 2 and thyroid goiter with compressive symptoms (dysphagia and occasional choking sensation) who presented for elective total thyroidectomy with Dr. Gerrit Friends. A neck exploration and lymph node biopsy was subsequently performed and will be admitted overnight for observation.  At the time of this encounter, he is resting in bed complaining of severe neck pain and wanting to eat. He denies HA, dizziness, nausea or vomiting.   His regular dialysis days are MWF at [facility] and pt states that he as no current issues with treatment. Per outpatient records, he gets close to his dry weight.   Past Medical History  Diagnosis Date  . Hypothyroidism   . Hyperlipidemia   . CVA (cerebrovascular accident)     hx CVA x 2  . GI bleed     2006, mallory-weiss tear  . Pulmonary edema     nov 2011, dry wt decreased  . Goiter     by CT Apr 2013  . Depression   . Cocaine abuse     past history  . Tobacco abuse     history of  . GERD (gastroesophageal reflux disease)   . Cataract   . Insomnia   . Chronic pain   . HTN (hypertension)     sees Dr. Ardelle Park, Rosalita Levan  . Neuromuscular disorder     diabetic neuropathy  . Occlusion of carotid stent     Per patients nursing aid  . ESRD (end stage renal disease) on dialysis     hd - mwf - [place]  . Pneumonia     hx of  . Diabetes mellitus     Sees Dr. Reather Littler [phone] -- fasting blood 70-110s  . DKA (diabetic ketoacidosis)     severe, Dec 2011  . History of blood transfusion   . On home oxygen therapy     2l nasal cannula at night   Past Surgical History  Procedure Laterality Date  . Av fistula placement  03/25/2008    right forearm   . Av fistula placement  04/23/2012    Procedure: INSERTION OF ARTERIOVENOUS (AV) GORE-TEX GRAFT THIGH;  Surgeon: Sherren Kerns, MD;  Location: [facility] OR;  Service: Vascular;  Laterality: Left;  . Arteriovenous graft placement Left     thigh  . Carotid stent insertion Right   . Dyalisis catheter    . Eye surgery Bilateral     cataracts   History reviewed. No pertinent family history. Social History:  reports that he has been smoking Cigarettes.  He has been smoking about 0.25 packs per day. He has never used smokeless tobacco. He reports that he does not drink alcohol or use illicit drugs. Allergies  Allergen Reactions  . Penicillins    Prior to Admission medications   Medication Sig Start Date End Date Taking? Authorizing Provider  amLODipine (NORVASC) 10 MG tablet Take 10 mg by mouth at bedtime.    Yes Historical Provider, MD  aspirin EC 81 MG tablet Take 81 mg by mouth daily.   Yes Historical Provider, MD  calcium acetate (PHOSLO) 667 MG tablet Take 1,334 mg by mouth 3 (three) times daily.  12/19/12  Yes Historical Provider, MD  cloNIDine (CATAPRES) 0.1 MG tablet Take 0.1 mg by mouth 3 (three) times daily.  Yes Historical Provider, MD  docusate sodium (COLACE) 100 MG capsule Take 100 mg by mouth 2 (two) times daily.    Yes Historical Provider, MD  EASY TOUCH INSULIN SYRINGE 31G X 5/16" 0.5 ML MISC  12/19/12  Yes Historical Provider, MD  epoetin alfa (EPOGEN,PROCRIT) 2000 UNIT/ML injection Inject 12,400 Units into the skin every Monday, Wednesday, and Friday with hemodialysis.   Yes Historical Provider, MD  FLUoxetine (PROZAC) 10 MG tablet Take 5 mg by mouth every morning.    Yes Historical Provider, MD  heparin 1000 unit/mL SOLN injection 2,400 Units by Dialysis route every Monday, Wednesday, and Friday with hemodialysis. 2400 Units = 2.4 mL   Yes Historical Provider, MD  HYDROcodone-acetaminophen (NORCO) 10-325 MG per tablet Take 1 tablet by mouth every 6 (six) hours as needed. For pain  04/23/12  Yes Samantha J Rhyne, PA-C  insulin aspart (NOVOLOG) 100 UNIT/ML injection Inject 4-6 Units into the skin 3 (three) times daily before meals. Inject 4 units after breakfast, 5 units after lunch, and 6 units after night   Yes Historical Provider, MD  insulin glargine (LANTUS) 100 UNIT/ML injection Inject 9-13 Units into the skin 2 (two) times daily. Inject 13 units in the morning, and 9 units in the evening.   Yes Historical Provider, MD  levothyroxine (SYNTHROID, LEVOTHROID) 112 MCG tablet Take 112 mcg by mouth daily before breakfast.   Yes Historical Provider, MD  metoprolol (LOPRESSOR) 50 MG tablet Take 75 mg by mouth 2 (two) times daily. Hold morning dose on dialysis days   Yes Historical Provider, MD  omeprazole (PRILOSEC) 20 MG capsule Take 20 mg by mouth daily.   Yes Historical Provider, MD  paricalcitol (ZEMPLAR) 2 MCG/ML injection Inject 2 mcg into the vein every Monday, Wednesday, and Friday with hemodialysis.   Yes Historical Provider, MD  pravastatin (PRAVACHOL) 10 MG tablet Take 10 mg by mouth at bedtime.   Yes Historical Provider, MD  PROAIR HFA 108 (90 BASE) MCG/ACT inhaler Inhale 2 puffs into the lungs every 4 (four) hours as needed for wheezing or shortness of breath.  12/08/12  Yes Historical Provider, MD  promethazine (PHENERGAN) 25 MG tablet Take 25 mg by mouth every 6 (six) hours as needed. For nausea   Yes Historical Provider, MD  sevelamer carbonate (RENVELA) 800 MG tablet Take 800 mg by mouth 3 (three) times daily with meals.  10/23/12  Yes Historical Provider, MD  temazepam (RESTORIL) 15 MG capsule Take 15 mg by mouth 4 (four) times a week. Monday, Wednesday, Friday, saturday   Yes Historical Provider, MD   Current Facility-Administered Medications  Medication Dose Route Frequency Provider Last Rate Last Dose  . 0.9 %  sodium chloride infusion   Intravenous Continuous Bedelia Person, MD 10 mL/hr at 01/28/13 0753    . dextrose 50 % solution 25 mL  25 mL Intravenous Once PRN Bedelia Person, MD      . fentaNYL (SUBLIMAZE) injection 50-100 mcg  50-100 mcg Intravenous Once Bedelia Person, MD      . midazolam (VERSED) injection 1-2 mg  1-2 mg Intravenous PRN Bedelia Person, MD       Labs: Basic Metabolic Panel:  Recent Labs Lab 01/28/13 0730  NA 139  K 4.3  GLUCOSE 50*   CBC:  Recent Labs Lab 01/28/13 0730  HGB 11.9*  HCT 35.0*   CBG:  Recent Labs Lab 01/28/13 0756 01/28/13 0813 01/28/13 0912 01/28/13 1015 01/28/13 1210  GLUCAP 67* 109* 105* 94 114*  Studies/Results: No results found.  ROS: Neck pain, hunger. 10 pt ROS asked and answered. All systems negative except at above.   Physical Exam: Filed Vitals:   01/28/13 1215 01/28/13 1219 01/28/13 1222 01/28/13 1236  BP:  147/79  152/59  Pulse: 55  54 54  Temp:      TempSrc:      Resp: 13  11 11   SpO2: 100%  100% 100%     General: Thin, chronically ill-appearing AAM, in obvious discomfort but no distress Head: Normocephalic, atraumatic, sclera non-icteric, mucus membranes are moist Neck: Supple. JVD not elevated. Surgical incision with clean dressing and ice pack Lungs: Mostly clear bilaterally. No appreciable, rales, or rhonchi. Breathing is unlabored. Poor effort Heart: RRR with S1 S2. No murmurs, rubs, or gallops appreciated. Abdomen: Soft, non-tender, non-distended with normoactive bowel sounds. No rebound/guarding. No obvious abdominal masses. M-S:  Strength and tone appear normal for age. Lower extremities: No pedal edema. SCDs present, no open wounds  Neuro: Alert and oriented X 3. Moves all extremities spontaneously. Psych:  Responds to questions appropriately with a normal affect. Dialysis Access: Left thigh AVG + bruit  Dialysis Orders: Center: [facility]  on MWF. EDW 61.5 kg HD [facility] 2K/2.25Ca  Time 4:15 Heparin 2400 Access L thigh AVG BFR 450 DFR A1.5   Hectorol 0 mcg IV/HD Epogen 7200   Units IV/HD  Venofer  50 mg Q week  Recent labs: Hgb 9.4. P 3.1, Tsat 48%, PTH  104.9  Assessment/Plan: 1. Thyroid goiter with compressive symptoms - Neck exploration + Rt lymph node biopsy with Dr. Gerrit Friends on 10/14. On synthroid.  2. ESRD -  MWF, K+ 4.3. Next HD 1st shift tomorrow in anticipation of discharge. No heparin. 3. Hypertension/volume  - SBPs 140s-150s.  Home meds resumed. No overt signs of vol xs. UF to edw as tolerated 4. Anemia  - Hgb 11.9 on op Epo. Hold for now. Continue weekly IV Fe. Follow CBC. 5. Metabolic bone disease -  Ca controlled op. Last P 3.1. Hold op binders (Phoslo 2 and Renvela 1) until swallowing better - might not need both at discharge. Last PTH 104.9 - no op Vit D. Check Renal panel 6. Nutrition - Dys 3 diet for now. Add fluid restriction. Renal diet when appropriate. Hold multivitamin until swallowing better. 7. DM - Labile BG per notes. On insulin per primary 8. Dispo - Possibly tomorrow after HD  Claud Kelp, PA-C [facility] Pager [phone] 01/28/2013, 12:53 PM   I have seen and examined this patient and agree with plan per Claud Kelp.  57yo BM with ESRD on HD in [place] admitted for neck exploration and LN BX.  On HD MWF and dialyzed yest.  Will plan HD in am.  Hold EPO and Vit D for now. Pleshette Tomasini T,MD 01/28/2013 2:32 PM

## 2013-01-28 NOTE — Preoperative (Signed)
Beta Blockers   Reason not to administer Beta Blockers:Metoprolol 0530 today

## 2013-01-28 NOTE — Anesthesia Postprocedure Evaluation (Signed)
  Anesthesia Post-op Note  Patient: Marcus [name]  Procedure(s) Performed: Procedure(s): LYMPH NODE BIOPSY (Right) NECK EXPLORATION (N/A)  Patient Location: PACU  Anesthesia Type:General  Level of Consciousness: awake  Airway and Oxygen Therapy: Patient Spontanous Breathing  Post-op Pain: mild  Post-op Assessment: Post-op Vital signs reviewed, Patient's Cardiovascular Status Stable, Respiratory Function Stable, Patent Airway, No signs of Nausea or vomiting and Pain level controlled  Post-op Vital Signs: Reviewed and stable  Complications: No apparent anesthesia complications

## 2013-01-28 NOTE — Progress Notes (Signed)
Hypoglycemic Event  CBG: 50  Treatment: D50 IV 25 mL  Symptoms: None  Follow-up CBG: Time:0755 CBG Result:67  Possible Reasons for Event: Inadequate meal intake  Comments/MD notified:Dr Philis Pique, Rainah Kirshner [name]  Remember to initiate Hypoglycemia Order Set & complete

## 2013-01-28 NOTE — Progress Notes (Signed)
Received from PACU A/ox4, denies nausea/pain at this time, oriented to room and surroundings

## 2013-01-28 NOTE — Progress Notes (Signed)
Pt has pre-existing left thigh AVF, positive bruit & thrill.

## 2013-01-28 NOTE — Progress Notes (Deleted)
DC to facility by employee worker, DC papers read to and verbally understood by caregiver. No questions ask

## 2013-01-28 NOTE — Progress Notes (Signed)
Rosey Bath pt Aide states that his CBG was 58 this am and she had to give him 3 Tablespoons of   Jelly.  CBG this am with Istat 50 Dr Gypsy Balsam called and informed of above, new orders noted.

## 2013-01-28 NOTE — Progress Notes (Signed)
Care of pt assumed by MA Trevelle Mcgurn RN 

## 2013-01-28 NOTE — Interval H&P Note (Signed)
History and Physical Interval Note:  01/28/2013 7:28 AM  Marcus [name]  has presented today for surgery, with the diagnosis of thyroid goiter.   The various methods of treatment have been discussed with the patient and family. After consideration of risks, benefits and other options for treatment, the patient has consented to    Procedure(s): THYROIDECTOMY total  (N/A) as a surgical intervention .    The patient's history has been reviewed, patient examined, no change in status, stable for surgery.  I have reviewed the patient's chart and labs.  Questions were answered to the patient's satisfaction.    Velora Heckler, MD, FACS General & Endocrine Surgery [facility] Office: [phone]   Lanai Conlee Judie Petit

## 2013-01-29 ENCOUNTER — Telehealth (INDEPENDENT_AMBULATORY_CARE_PROVIDER_SITE_OTHER): Payer: Self-pay

## 2013-01-29 ENCOUNTER — Telehealth (INDEPENDENT_AMBULATORY_CARE_PROVIDER_SITE_OTHER): Payer: Self-pay | Admitting: General Surgery

## 2013-01-29 ENCOUNTER — Encounter (HOSPITAL_COMMUNITY): Payer: Self-pay | Admitting: Surgery

## 2013-01-29 DIAGNOSIS — E049 Nontoxic goiter, unspecified: Secondary | ICD-10-CM

## 2013-01-29 DIAGNOSIS — J9859 Other diseases of mediastinum, not elsewhere classified: Secondary | ICD-10-CM

## 2013-01-29 LAB — CBC
MCV: 96.4 fL (ref 78.0–100.0)
Platelets: 281 10*3/uL (ref 150–400)
RBC: 3.08 MIL/uL — ABNORMAL LOW (ref 4.22–5.81)
WBC: 5.4 10*3/uL (ref 4.0–10.5)

## 2013-01-29 LAB — RENAL FUNCTION PANEL
Albumin: 3 g/dL — ABNORMAL LOW (ref 3.5–5.2)
CO2: 29 mEq/L (ref 19–32)
Chloride: 91 mEq/L — ABNORMAL LOW (ref 96–112)
Creatinine, Ser: 6.56 mg/dL — ABNORMAL HIGH (ref 0.50–1.35)
GFR calc Af Amer: 10 mL/min — ABNORMAL LOW (ref >90)
GFR calc non Af Amer: 8 mL/min — ABNORMAL LOW (ref >90)
Potassium: 4.4 mEq/L (ref 3.5–5.1)
Sodium: 129 mEq/L — ABNORMAL LOW (ref 135–145)

## 2013-01-29 LAB — GLUCOSE, CAPILLARY
Glucose-Capillary: 112 mg/dL — ABNORMAL HIGH (ref 70–99)
Glucose-Capillary: 59 mg/dL — ABNORMAL LOW (ref 70–99)
Glucose-Capillary: 72 mg/dL (ref 70–99)

## 2013-01-29 MED ORDER — FLUOXETINE HCL 20 MG/5ML PO SOLN
5.0000 mg | Freq: Every day | ORAL | Status: DC
  Administered 2013-01-29: 5 mg via ORAL
  Filled 2013-01-29: qty 5

## 2013-01-29 MED ORDER — DEXTROSE 50 % IV SOLN
INTRAVENOUS | Status: AC
  Administered 2013-01-29: 50 mL
  Filled 2013-01-29: qty 50

## 2013-01-29 MED ORDER — PENTAFLUOROPROP-TETRAFLUOROETH EX AERO
INHALATION_SPRAY | CUTANEOUS | Status: AC
  Administered 2013-01-29: 08:00:00
  Filled 2013-01-29: qty 103.5

## 2013-01-29 MED ORDER — HYDROCODONE-ACETAMINOPHEN 5-325 MG PO TABS: 1.0000 | ORAL_TABLET | ORAL | Status: DC | PRN

## 2013-01-29 NOTE — Telephone Encounter (Signed)
PO appt made. LMOM for pt to call for appt date.

## 2013-01-29 NOTE — Telephone Encounter (Signed)
Message copied by Joanette Gula on Wed Jan 29, 2013  9:35 AM ------      Message from: Velora Heckler      Created: Wed Jan 29, 2013  7:35 AM       Patient should go home this afternoon.            Will need to see in office in 3-4 weeks for wound check.            Will need to schedule CT of chest with contrast in 6 weeks to evaluate substernal goiter.            tmg ------

## 2013-01-29 NOTE — Progress Notes (Signed)
CRITICAL VALUE ALERT  Critical value received:  Blood glucose 44  Date of notification:  01/29/13  Time of notification:  0931  Critical value read back: yes  Nurse who received alert: Earle Gell, RN  MD notified (1st page):  Dr. Briant Cedar   Time of first page:  On the unit   MD notified (2nd page):  Time of second page:  Responding MD:  Briant Cedar  Time MD responded:  [id number]

## 2013-01-29 NOTE — Op Note (Signed)
Marcus [name], Marcus [name] NO.:  [id number]  MEDICAL RECORD NO.:  [id number]  LOCATION:  6N04C                        FACILITY:  [facility]  PHYSICIAN:  Velora Heckler, MD      DATE OF BIRTH:  [birth year]  DATE OF PROCEDURE:  01/28/2013                               OPERATIVE REPORT   PREOPERATIVE DIAGNOSIS:  Thyroid goiter with compressive symptoms.  POSTOPERATIVE DIAGNOSIS:  Substernal thyroid goiter, lymphocytic thyroiditis.  PROCEDURE: 1. Neck exploration. 2. Excisional lymph node biopsy.  SURGEON:  Velora Heckler, MD, FACS  ASSISTANT:  Magnus Ivan, RNFA.  ESTIMATED BLOOD LOSS:  Minimal.  PREPARATION:  ChloraPrep.  COMPLICATIONS:  None.  INDICATIONS:  The patient is a 57 year old male referred by his primary care physician for thyroid goiter with compressive symptoms.  The patient has a long-standing history of goiter which has gradually increased in size.  He is currently taking Synthroid 112 mcg daily. Ultrasound demonstrated a markedly enlarged thyroid gland with each lobe measuring at least 7 cm in greatest dimension.  This extends to the sternal notch and thoracic inlet.  There is extrinsic compression of the trachea.  The patient now comes to Surgery for thyroidectomy.  BODY OF REPORT:  Procedures done in OR #9 at the [facility].  The patient was brought to the operating room, placed in supine position on the operating room table.  Following administration of general anesthesia, the patient was positioned and then prepped and draped in the usual aseptic fashion.  After ascertaining that an adequate level of anesthesia had been achieved, a Kocher incision was made with a #15 blade.  Dissection was carried through subcutaneous tissues and platysma.  Hemostasis was obtained with the electrocautery. Skin flaps were elevated cephalad and caudad.  Of note, there was marked venous hypertension in the external jugular veins.  There were  multiple collaterals.  Mahorner self-retaining retractor was placed for exposure. Strap muscles were incised in the midline.  The larynx is located quite low in the neck.  There is limited distance between the thyroid cartilage and the sternal notch.  Strap muscles were incised in the midline.  Again, venous hypertension is evident.  There are multiple small firm lymph nodes which appear inflammatory.  Three of these lymph nodes were excised and one of them was submitted to frozen section. This confirms thyroid tissue which has the appearance of lymphocytic thyroiditis.  No evidence of malignancy was identified.  The remaining 2 lymph nodes were submitted for permanent section only.  Exploration reveals a markedly enlarged very firm pale thyroid gland which is adherent to the surrounding tissues.  It extends beneath the manubrium and into the chest bilaterally.  It is somewhat fixed in position and was not easily mobilized.  Again, venous collaterals are prominent.  A decision was made to not proceed with thyroidectomy due to the potential need for median sternotomy for which we are not prepared at this time.  Therefore, good hemostasis is achieved.  Fibrillar was placed in the limited operative field.  Strap muscles were reapproximated in the midline with interrupted 3-0 Vicryl sutures.  Platysma was closed with interrupted 3-0 Vicryl sutures.  Skin  was closed with a running 4-0 Monocryl subcuticular suture.  Wound was washed and dried and benzoin and Steri-Strips were applied.  Sterile dressings were applied.  The patient was awakened from anesthesia and brought to the recovery room. The patient tolerated the procedure well.   Velora Heckler, MD, FACS General & Endocrine Surgery [facility] Office: [phone]   TMG/MEDQ  D:  01/28/2013  T:  01/29/2013  Job:  [id number]  cc:   Donnel Saxon, MD

## 2013-01-29 NOTE — Telephone Encounter (Signed)
Spoke with Aggie Cosier and informed her that we have the patient set up for a CT chest at [facility] located at [address] on 03/12/13 at 9:15.  Explained that he cannot have any solid foods 4 hours prior to the test.  Also explained that I would mail this information to her as well considering the patient is still inpatient at the hospital.

## 2013-01-29 NOTE — Procedures (Signed)
Pt seen on HD.  Ap 120 Vp 210.  BS in the 40's, to get D50.  Should be able to go later today after HD.

## 2013-01-29 NOTE — Progress Notes (Signed)
Patient ID: Marcus [name], male   DOB: 1956-03-30, 57 y.o.   MRN: [id number]  General Surgery - [facility] - Progress Note  POD# 1  Subjective: Patient in HD.  No complaints.  Discussed procedure with patient and discussed possibility of more surgery in the future.  Patient expresses understanding.  Anticipating discharge after dialysis today.  Objective: Vital signs in last 24 hours: Temp:  [97.3 F (36.3 C)-98.9 F (37.2 C)] 97.9 F (36.6 C) (10/15 0628) Pulse Rate:  [53-80] 60 (10/15 0628) Resp:  [11-18] 18 (10/15 0628) BP: (98-178)/(43-79) 98/43 mmHg (10/15 0628) SpO2:  [96 %-100 %] 100 % (10/15 0628)    Intake/Output from previous day: 10/14 0701 - 10/15 0700 In: 1080 [P.O.:390; I.V.:690] Out: 5 [Blood:5]  Exam: HEENT - clear, not icteric Neck - wound clear and dry; steristrips in place; mild soft tissue swelling; voice unchanged Chest - clear bilaterally Cor - RRR, no murmur Abd - soft Ext - no significant edema  Lab Results:   Recent Labs  01/28/13 0730  HGB 11.9*  HCT 35.0*     Recent Labs  01/28/13 0730  NA 139  K 4.3  GLUCOSE 50*    Studies/Results: No results found.  Assessment / Plan: 1.  Status post neck exploration and lymph node biopsies  HD this AM per renal  Anticipate discharge after HD this afternoon  Will see in [facility] office 3 weeks to discuss further work-up  Velora Heckler, MD, [facility] Office: [phone]  01/29/2013

## 2013-01-29 NOTE — Telephone Encounter (Signed)
Ct order in epic and to Stevie to set up Ct for Nov and call pt with date.

## 2013-01-30 ENCOUNTER — Telehealth (INDEPENDENT_AMBULATORY_CARE_PROVIDER_SITE_OTHER): Payer: Self-pay

## 2013-01-30 NOTE — Telephone Encounter (Signed)
Pt home doing well. Given po appt date and reminded sister of CT 03-12-13.

## 2013-01-30 NOTE — Discharge Summary (Signed)
Physician Discharge Summary [facility]  Patient ID: Marcus [name] MRN: [id number] DOB/AGE: 1955/04/29 57 y.o.  Admit date: 01/28/2013 Discharge date: 01/29/2013  Admission Diagnoses:  Thyroid goiter with compressive symptoms  Discharge Diagnoses:  Principal Problem:   Thyroid goiter   Discharged Condition: good  Hospital Course: patient admitted for observation after neck exploration and lymph node biopsies.  Thyroidectomy not performed due to mediastinal location of thyroid.  Post op course stable.  Seen in consultation by renal medicine for hemodialysis.  Prepared for discharge POD#1 after HD.  Consults: nephrology  Significant Diagnostic Studies: labs   Treatments: dialysis: Hemodialysis  Discharge Exam: Blood pressure 137/66, pulse 67, temperature 98.7 F (37.1 C), temperature source Oral, resp. rate 18, height 6' (1.829 m), weight 133 lb 2.5 oz (60.4 kg), SpO2 100.00%. HEENT - clear Neck - incision clear and dry, mild STS; voice unchanged from pre-op Chest - clear bilaterally Cor - RRR  Disposition: discharge POD#1  Discharge Orders   Future Appointments Provider Department Dept Phone   02/20/2013 2:45 PM Velora Heckler, MD [facility], [place] [phone]   03/12/2013 9:30 AM [facility] [place] [facility] AT [facility] [phone]   Patient to arrive 15 minutes prior to appointment time.   04/01/2013 10:45 AM Reather Littler, MD [facility] [phone]   Future Orders Complete By Expires   Diet - low sodium heart healthy  As directed    Discharge instructions  As directed    Comments:     THYROID & PARATHYROID SURGERY - POST OP INSTRUCTIONS  Always review your discharge instruction sheet from the facility where your surgery was performed.  A prescription for pain medication may be given to you upon discharge.  Take your pain medication as prescribed.  If narcotic pain medicine is not needed, then  you may take acetaminophen (Tylenol) or ibuprofen (Advil) as needed.  Take your usually prescribed medications unless otherwise directed.  If you need a refill on your pain medication, please contact your pharmacy. They will contact our office to request authorization.  Prescriptions will not be processed after 5 pm or on weekends.  Start with a light diet upon arrival home, such as soup and crackers or toast.  Be sure to drink plenty of fluids daily.  Resume your normal diet the day after surgery.  Most patients will experience some swelling and bruising on the chest and neck area.  Ice packs will help.  Swelling and bruising can take several days to resolve.   It is common to experience some constipation if taking pain medication after surgery.  Increasing fluid intake and taking a stool softener will usually help or prevent this problem.  A mild laxative (Milk of Magnesia or Miralax) should be taken according to package directions if there are no bowel movements after 48 hours.  You may remove your bandages 24-48 hours after surgery, and you may shower at that time.  You have steri-strips (small skin tapes) in place directly over the incision.  These strips should be left on the skin for 7-10 days and then removed.  You may resume regular (light) daily activities beginning the next day-such as daily self-care, walking, climbing stairs-gradually increasing activities as tolerated.  You may have sexual intercourse when it is comfortable.  Refrain from any heavy lifting or straining until approved by your doctor.  You may drive when you no longer are taking prescription pain medication, you can comfortably wear a seatbelt, and  you can safely maneuver your car and apply brakes.  You should see your doctor in the office for a follow-up appointment approximately two to three weeks after your surgery.  Make sure that you call for this appointment within a day or two after you arrive home to insure a  convenient appointment time.  WHEN TO CALL YOUR DOCTOR: -- Fever greater than 101.5 -- Inability to urinate -- Nausea and/or vomiting - persistent -- Extreme swelling or bruising -- Continued bleeding from incision -- Increased pain, redness, or drainage from the incision -- Difficulty swallowing or breathing -- Muscle cramping or spasms -- Numbness or tingling in hands or around lips  The clinic staff is available to answer your questions during regular business hours.  Please don't hesitate to call and ask to speak to one of the nurses if you have concerns.  Velora Heckler, MD, FACS General & Endocrine Surgery [facility] Office: [phone]   Increase activity slowly  As directed    Remove dressing in 24 hours  As directed        Medication List         amLODipine 10 MG tablet  Commonly known as:  NORVASC  Take 10 mg by mouth at bedtime.     aspirin EC 81 MG tablet  Take 81 mg by mouth daily.     calcium acetate 667 MG tablet  Commonly known as:  PHOSLO  Take 1,334 mg by mouth 3 (three) times daily.     cloNIDine 0.1 MG tablet  Commonly known as:  CATAPRES  Take 0.1 mg by mouth 3 (three) times daily.     docusate sodium 100 MG capsule  Commonly known as:  COLACE  Take 100 mg by mouth 2 (two) times daily.     EASY TOUCH INSULIN SYRINGE 31G X 5/16" 0.5 ML Misc  Generic drug:  Insulin Syringe-Needle U-100     epoetin alfa 2000 UNIT/ML injection  Commonly known as:  EPOGEN,PROCRIT  Inject 12,400 Units into the skin every Monday, Wednesday, and Friday with hemodialysis.     FLUoxetine 10 MG tablet  Commonly known as:  PROZAC  Take 5 mg by mouth every morning.     heparin 1000 unit/mL Soln injection  2,400 Units by Dialysis route every Monday, Wednesday, and Friday with hemodialysis. 2400 Units = 2.4 mL     HYDROcodone-acetaminophen 10-325 MG per tablet  Commonly known as:  NORCO  Take 1 tablet by mouth every 6 (six) hours as needed. For  pain     HYDROcodone-acetaminophen 5-325 MG per tablet  Commonly known as:  NORCO/VICODIN  Take 1-2 tablets by mouth every 4 (four) hours as needed for pain.     insulin aspart 100 UNIT/ML injection  Commonly known as:  novoLOG  Inject 4-6 Units into the skin 3 (three) times daily before meals. Inject 4 units after breakfast, 5 units after lunch, and 6 units after night     insulin glargine 100 UNIT/ML injection  Commonly known as:  LANTUS  Inject 9-13 Units into the skin 2 (two) times daily. Inject 13 units in the morning, and 9 units in the evening.     levothyroxine 112 MCG tablet  Commonly known as:  SYNTHROID, LEVOTHROID  Take 112 mcg by mouth daily before breakfast.     metoprolol 50 MG tablet  Commonly known as:  LOPRESSOR  Take 75 mg by mouth 2 (two) times daily. Hold morning dose on dialysis days  omeprazole 20 MG capsule  Commonly known as:  PRILOSEC  Take 20 mg by mouth daily.     paricalcitol 2 MCG/ML injection  Commonly known as:  ZEMPLAR  Inject 2 mcg into the vein every Monday, Wednesday, and Friday with hemodialysis.     pravastatin 10 MG tablet  Commonly known as:  PRAVACHOL  Take 10 mg by mouth at bedtime.     PROAIR HFA 108 (90 BASE) MCG/ACT inhaler  Generic drug:  albuterol  Inhale 2 puffs into the lungs every 4 (four) hours as needed for wheezing or shortness of breath.     promethazine 25 MG tablet  Commonly known as:  PHENERGAN  Take 25 mg by mouth every 6 (six) hours as needed. For nausea     sevelamer carbonate 800 MG tablet  Commonly known as:  RENVELA  Take 800 mg by mouth 3 (three) times daily with meals.     temazepam 15 MG capsule  Commonly known as:  RESTORIL  Take 15 mg by mouth 4 (four) times a week. Monday, Wednesday, Friday, saturday           Follow-up Information   Follow up with Velora Heckler, MD. Schedule an appointment as soon as possible for a visit in 3 weeks.   Specialty:  General Surgery   Contact information:    [address] [place] [address] [phone]       Velora Heckler, MD, [facility] Office: [phone]   Signed: Velora Heckler 01/30/2013, 9:30 AM

## 2013-02-06 ENCOUNTER — Telehealth (INDEPENDENT_AMBULATORY_CARE_PROVIDER_SITE_OTHER): Payer: Self-pay

## 2013-02-06 ENCOUNTER — Other Ambulatory Visit (INDEPENDENT_AMBULATORY_CARE_PROVIDER_SITE_OTHER): Payer: Self-pay

## 2013-02-06 DIAGNOSIS — G8918 Other acute postprocedural pain: Secondary | ICD-10-CM

## 2013-02-06 MED ORDER — HYDROCODONE-ACETAMINOPHEN 5-325 MG PO TABS: 1.0000 | ORAL_TABLET | ORAL | Status: DC | PRN

## 2013-02-06 NOTE — Telephone Encounter (Signed)
Vicodin 5/325 #20 rx written per Dr Ardine Eng request. Signed by Dr Andrey Campanile and to front desk for pick up.

## 2013-02-06 NOTE — Telephone Encounter (Signed)
Joanette Gula, LPN at 16/01/9603 1:24 PM   Status: Signed            Pt called stating he is still having a lot of throat pain when he swallows. No fever. No swelling. No sob. Pt requests more pain med. Pt states he cannot eat. I asked pt if he could swallow his pills and he states "yes when he has his coffee and scrabbled egg in the morning". I spoke with care giver and she states he is eating very soft food. She states he is out of pain med and was taking vicodin prior to surgery by his PCP for multiple medical issues. She states he takes pain med 3 times a day. Pt has appt today with his PCP. I advised care giver to have PCP check pts mouth for any irritation such as thrush that may be causing pt discomfort when he swallows. I advised her I will check with Dr Gerrit Friends also re: this and pts request for more narcotics.     OK to refill pain Rx - #20.  Velora Heckler, MD, [facility] Office: [phone]

## 2013-02-06 NOTE — Telephone Encounter (Signed)
Pt called stating he is still having a lot of throat pain when he swallows. No fever. No swelling. No sob. Pt requests more pain med.  Pt states he cannot eat. I asked pt if he could swallow his pills and he states "yes when he has his coffee and scrabbled egg in the morning". I spoke with care giver and she states he is eating very soft food. She states he is out of pain med and was taking vicodin prior to surgery by his PCP for multiple medical issues. She states he takes pain med 3 times a day.  Pt has appt today with his PCP. I advised care giver to have PCP check pts mouth for any irritation such as thrush that may be causing pt discomfort when he swallows. I advised her I will check with Dr Gerrit Friends also re: this and pts request for more narcotics.

## 2013-02-20 ENCOUNTER — Ambulatory Visit (INDEPENDENT_AMBULATORY_CARE_PROVIDER_SITE_OTHER): Payer: Medicare Other | Admitting: Surgery

## 2013-02-20 ENCOUNTER — Encounter (INDEPENDENT_AMBULATORY_CARE_PROVIDER_SITE_OTHER): Payer: Self-pay | Admitting: Surgery

## 2013-02-20 ENCOUNTER — Encounter (INDEPENDENT_AMBULATORY_CARE_PROVIDER_SITE_OTHER): Payer: Self-pay

## 2013-02-20 VITALS — BP 142/72 | HR 92 | Temp 97.5°F | Resp 14 | Ht 74.0 in | Wt 140.0 lb

## 2013-02-20 DIAGNOSIS — E049 Nontoxic goiter, unspecified: Secondary | ICD-10-CM

## 2013-02-20 NOTE — Patient Instructions (Signed)
  COCOA BUTTER & VITAMIN E CREAM  ([employer] or other brand)  Apply cocoa butter/vitamin E cream to your incision 2 - 3 times daily.  Massage cream into incision for one minute with each application.  Use sunscreen (50 SPF or higher) for first 6 months after surgery if area is exposed to sun.  You may substitute Mederma or other scar reducing creams as desired.   

## 2013-02-20 NOTE — Progress Notes (Signed)
General Surgery [facility]  Chief Complaint  Patient presents with  . Routine Post Op    lymph node biopsy / aborted thyroidectomy    HISTORY: Patient is a 57 year old male with a large thyroid goiter. Patient was taken to the operating room in October 2014. Neck was explored. At operation he was found to have a markedly enlarged thyroid gland with significant inflammatory changes. The thyroid was largely retrosternal. Biopsies were taken which showed lymphocytic thyroiditis. 2 left nodes were also excised which were benign. Thyroidectomy was not performed due to the location of the gland, the extensive inflammatory reaction, and the probability of a need for a median sternotomy to complete the operation. Patient returns today for postoperative care.  EXAM: Surgical wound is healing nicely. Steri-Strips are removed. Mild soft tissue swelling. No sign of infection. Voice quality is normal.  IMPRESSION: #1 lymphocytic thyroiditis #2 substernal thyroid goiter #3 end-stage renal disease on hemodialysis  PLAN: Patient is scheduled to undergo a CT scan of the neck and chest in late November to evaluate his substernal goiter. I will review this study when it is available. At that time we will make a determination about whether or not to pursue surgical resection. He is likely to require median sternotomy if we are to proceed with surgery. He may require referral to a tertiary care center pending the results of the CT scan.  I will contact the patient with the results of his CT scan when it is available.  Velora Heckler, MD, FACS General & Endocrine Surgery [facility]   Visit Diagnoses: 1. Thyroid goiter

## 2013-02-25 ENCOUNTER — Inpatient Hospital Stay (HOSPITAL_COMMUNITY)
Admission: AD | Admit: 2013-02-25 | Discharge: 2013-03-11 | DRG: 252 | Disposition: A | Discharge status: Home / Self Care | Payer: Medicare Other | Source: Other Acute Inpatient Hospital | Attending: Internal Medicine | Admitting: Internal Medicine

## 2013-02-25 DIAGNOSIS — I82629 Acute embolism and thrombosis of deep veins of unspecified upper extremity: Secondary | ICD-10-CM | POA: Diagnosis present

## 2013-02-25 DIAGNOSIS — N2581 Secondary hyperparathyroidism of renal origin: Secondary | ICD-10-CM | POA: Diagnosis present

## 2013-02-25 DIAGNOSIS — E1065 Type 1 diabetes mellitus with hyperglycemia: Secondary | ICD-10-CM | POA: Diagnosis present

## 2013-02-25 DIAGNOSIS — I82890 Acute embolism and thrombosis of other specified veins: Secondary | ICD-10-CM | POA: Diagnosis present

## 2013-02-25 DIAGNOSIS — E039 Hypothyroidism, unspecified: Secondary | ICD-10-CM

## 2013-02-25 DIAGNOSIS — Z8673 Personal history of transient ischemic attack (TIA), and cerebral infarction without residual deficits: Secondary | ICD-10-CM | POA: Diagnosis present

## 2013-02-25 DIAGNOSIS — I82409 Acute embolism and thrombosis of unspecified deep veins of unspecified lower extremity: Secondary | ICD-10-CM

## 2013-02-25 DIAGNOSIS — Z794 Long term (current) use of insulin: Secondary | ICD-10-CM | POA: Diagnosis present

## 2013-02-25 DIAGNOSIS — I82A19 Acute embolism and thrombosis of unspecified axillary vein: Secondary | ICD-10-CM | POA: Diagnosis present

## 2013-02-25 DIAGNOSIS — I120 Hypertensive chronic kidney disease with stage 5 chronic kidney disease or end stage renal disease: Secondary | ICD-10-CM | POA: Diagnosis present

## 2013-02-25 DIAGNOSIS — E1029 Type 1 diabetes mellitus with other diabetic kidney complication: Secondary | ICD-10-CM | POA: Diagnosis present

## 2013-02-25 DIAGNOSIS — I82C19 Acute embolism and thrombosis of unspecified internal jugular vein: Principal | ICD-10-CM | POA: Diagnosis present

## 2013-02-25 DIAGNOSIS — E785 Hyperlipidemia, unspecified: Secondary | ICD-10-CM | POA: Diagnosis present

## 2013-02-25 DIAGNOSIS — E049 Nontoxic goiter, unspecified: Secondary | ICD-10-CM | POA: Diagnosis present

## 2013-02-25 DIAGNOSIS — E43 Unspecified severe protein-calorie malnutrition: Secondary | ICD-10-CM | POA: Diagnosis present

## 2013-02-25 DIAGNOSIS — N186 End stage renal disease: Secondary | ICD-10-CM | POA: Diagnosis present

## 2013-02-25 DIAGNOSIS — E079 Disorder of thyroid, unspecified: Secondary | ICD-10-CM | POA: Diagnosis present

## 2013-02-25 DIAGNOSIS — I82A11 Acute embolism and thrombosis of right axillary vein: Secondary | ICD-10-CM

## 2013-02-25 DIAGNOSIS — E1142 Type 2 diabetes mellitus with diabetic polyneuropathy: Secondary | ICD-10-CM | POA: Diagnosis present

## 2013-02-25 DIAGNOSIS — I10 Essential (primary) hypertension: Secondary | ICD-10-CM

## 2013-02-25 DIAGNOSIS — M899 Disorder of bone, unspecified: Secondary | ICD-10-CM | POA: Diagnosis present

## 2013-02-25 DIAGNOSIS — E063 Autoimmune thyroiditis: Secondary | ICD-10-CM | POA: Diagnosis present

## 2013-02-25 DIAGNOSIS — K219 Gastro-esophageal reflux disease without esophagitis: Secondary | ICD-10-CM | POA: Diagnosis present

## 2013-02-25 DIAGNOSIS — E1049 Type 1 diabetes mellitus with other diabetic neurological complication: Secondary | ICD-10-CM | POA: Diagnosis present

## 2013-02-25 DIAGNOSIS — IMO0002 Reserved for concepts with insufficient information to code with codable children: Secondary | ICD-10-CM | POA: Diagnosis present

## 2013-02-25 DIAGNOSIS — T82898A Other specified complication of vascular prosthetic devices, implants and grafts, initial encounter: Secondary | ICD-10-CM

## 2013-02-25 DIAGNOSIS — E1010 Type 1 diabetes mellitus with ketoacidosis without coma: Secondary | ICD-10-CM | POA: Diagnosis present

## 2013-02-25 DIAGNOSIS — R1310 Dysphagia, unspecified: Secondary | ICD-10-CM | POA: Diagnosis present

## 2013-02-25 DIAGNOSIS — E0789 Other specified disorders of thyroid: Secondary | ICD-10-CM | POA: Diagnosis present

## 2013-02-25 DIAGNOSIS — Z992 Dependence on renal dialysis: Secondary | ICD-10-CM | POA: Diagnosis present

## 2013-02-25 LAB — CBC
Hemoglobin: 11 g/dL — ABNORMAL LOW (ref 13.0–17.0)
MCHC: 31.4 g/dL (ref 30.0–36.0)
Platelets: 256 10*3/uL (ref 150–400)

## 2013-02-25 LAB — MRSA PCR SCREENING: MRSA by PCR: NEGATIVE

## 2013-02-25 LAB — PROTIME-INR
INR: 0.96 (ref 0.00–1.49)
Prothrombin Time: 12.6 seconds (ref 11.6–15.2)

## 2013-02-25 LAB — BASIC METABOLIC PANEL
GFR calc Af Amer: 13 mL/min — ABNORMAL LOW (ref >90)
GFR calc non Af Amer: 11 mL/min — ABNORMAL LOW (ref >90)
Glucose, Bld: 281 mg/dL — ABNORMAL HIGH (ref 70–99)
Potassium: 3.9 mEq/L (ref 3.5–5.1)
Sodium: 131 mEq/L — ABNORMAL LOW (ref 135–145)

## 2013-02-25 LAB — GLUCOSE, CAPILLARY
Glucose-Capillary: 249 mg/dL — ABNORMAL HIGH (ref 70–99)
Glucose-Capillary: 59 mg/dL — ABNORMAL LOW (ref 70–99)

## 2013-02-25 LAB — HEPARIN LEVEL (UNFRACTIONATED): Heparin Unfractionated: <0.1 IU/mL — ABNORMAL LOW (ref 0.30–0.70)

## 2013-02-25 MED ORDER — ASPIRIN EC 81 MG PO TBEC
81.0000 mg | DELAYED_RELEASE_TABLET | Freq: Every day | ORAL | Status: DC
  Administered 2013-02-25 – 2013-03-11 (×15): 81 mg via ORAL
  Filled 2013-02-25 (×15): qty 1

## 2013-02-25 MED ORDER — INSULIN GLARGINE 100 UNIT/ML ~~LOC~~ SOLN
13.0000 [IU] | Freq: Every day | SUBCUTANEOUS | Status: DC
  Administered 2013-02-25 – 2013-02-26 (×2): 13 [IU] via SUBCUTANEOUS
  Filled 2013-02-25 (×2): qty 0.13

## 2013-02-25 MED ORDER — CLONIDINE HCL 0.1 MG PO TABS
0.1000 mg | ORAL_TABLET | Freq: Three times a day (TID) | ORAL | Status: DC
  Administered 2013-02-25 – 2013-03-04 (×18): 0.1 mg via ORAL
  Filled 2013-02-25 (×24): qty 1

## 2013-02-25 MED ORDER — HEPARIN (PORCINE) IN NACL 100-0.45 UNIT/ML-% IJ SOLN
1200.0000 [IU]/h | INTRAMUSCULAR | Status: DC
  Administered 2013-02-25: 1000 [IU]/h via INTRAVENOUS
  Administered 2013-02-25: 1200 [IU]/h via INTRAVENOUS
  Administered 2013-02-25: 800 [IU]/h via INTRAVENOUS
  Filled 2013-02-25 (×4): qty 250

## 2013-02-25 MED ORDER — VANCOMYCIN HCL 1000 MG IV SOLR
1000.0000 mg | INTRAVENOUS | Status: AC
  Administered 2013-02-27: 1000 mg
  Filled 2013-02-25 (×2): qty 1000

## 2013-02-25 MED ORDER — PARICALCITOL 2 MCG/ML IV SOLN: 2.0000 ug | INTRAVENOUS | Status: DC

## 2013-02-25 MED ORDER — INSULIN GLARGINE 100 UNIT/ML ~~LOC~~ SOLN
9.0000 [IU] | Freq: Every day | SUBCUTANEOUS | Status: DC
  Administered 2013-02-25: 9 [IU] via SUBCUTANEOUS
  Filled 2013-02-25 (×2): qty 0.09

## 2013-02-25 MED ORDER — WARFARIN - PHARMACIST DOSING INPATIENT: Freq: Every day | Status: DC

## 2013-02-25 MED ORDER — WARFARIN SODIUM 7.5 MG PO TABS
7.5000 mg | ORAL_TABLET | Freq: Once | ORAL | Status: AC
  Administered 2013-02-25: 7.5 mg via ORAL
  Filled 2013-02-25: qty 1

## 2013-02-25 MED ORDER — METOPROLOL TARTRATE 25 MG PO TABS
75.0000 mg | ORAL_TABLET | Freq: Two times a day (BID) | ORAL | Status: DC
  Administered 2013-02-25 – 2013-03-11 (×28): 75 mg via ORAL
  Filled 2013-02-25 (×31): qty 1

## 2013-02-25 MED ORDER — PROMETHAZINE HCL 25 MG PO TABS
25.0000 mg | ORAL_TABLET | Freq: Four times a day (QID) | ORAL | Status: DC | PRN
  Administered 2013-03-01: 25 mg via ORAL
  Filled 2013-02-25: qty 1

## 2013-02-25 MED ORDER — HEPARIN BOLUS VIA INFUSION
2000.0000 [IU] | Freq: Once | INTRAVENOUS | Status: AC
  Administered 2013-02-25: 2000 [IU] via INTRAVENOUS
  Filled 2013-02-25: qty 2000

## 2013-02-25 MED ORDER — NEPRO/CARBSTEADY PO LIQD
237.0000 mL | Freq: Two times a day (BID) | ORAL | Status: DC
  Administered 2013-02-25: 237 mL via ORAL

## 2013-02-25 MED ORDER — TEMAZEPAM 15 MG PO CAPS: 15.0000 mg | ORAL_CAPSULE | ORAL | Status: DC

## 2013-02-25 MED ORDER — INSULIN ASPART 100 UNIT/ML ~~LOC~~ SOLN: 6.0000 [IU] | Freq: Every day | SUBCUTANEOUS | Status: DC

## 2013-02-25 MED ORDER — DOCUSATE SODIUM 100 MG PO CAPS
100.0000 mg | ORAL_CAPSULE | Freq: Two times a day (BID) | ORAL | Status: DC
  Administered 2013-02-25 – 2013-03-11 (×28): 100 mg via ORAL
  Filled 2013-02-25 (×30): qty 1

## 2013-02-25 MED ORDER — ALBUTEROL SULFATE HFA 108 (90 BASE) MCG/ACT IN AERS
2.0000 | INHALATION_SPRAY | RESPIRATORY_TRACT | Status: DC | PRN
  Administered 2013-03-05: 2 via RESPIRATORY_TRACT
  Filled 2013-02-25: qty 6.7

## 2013-02-25 MED ORDER — TEMAZEPAM 15 MG PO CAPS
15.0000 mg | ORAL_CAPSULE | ORAL | Status: DC
  Administered 2013-02-25 – 2013-03-09 (×7): 15 mg via ORAL
  Filled 2013-02-25 (×9): qty 1

## 2013-02-25 MED ORDER — INSULIN ASPART 100 UNIT/ML ~~LOC~~ SOLN: 5.0000 [IU] | Freq: Every day | SUBCUTANEOUS | Status: DC

## 2013-02-25 MED ORDER — SEVELAMER CARBONATE 800 MG PO TABS
800.0000 mg | ORAL_TABLET | Freq: Three times a day (TID) | ORAL | Status: DC
  Administered 2013-02-25 (×2): 800 mg via ORAL
  Filled 2013-02-25 (×5): qty 1

## 2013-02-25 MED ORDER — AMLODIPINE BESYLATE 10 MG PO TABS
10.0000 mg | ORAL_TABLET | Freq: Every day | ORAL | Status: DC
  Administered 2013-02-25 – 2013-03-08 (×12): 10 mg via ORAL
  Filled 2013-02-25 (×15): qty 1

## 2013-02-25 MED ORDER — HYDROCODONE-ACETAMINOPHEN 5-325 MG PO TABS
1.0000 | ORAL_TABLET | ORAL | Status: DC | PRN
  Administered 2013-02-26 (×2): 1 via ORAL
  Administered 2013-02-27 – 2013-02-28 (×3): 2 via ORAL
  Administered 2013-03-01: 1 via ORAL
  Administered 2013-03-04: 2 via ORAL
  Filled 2013-02-25: qty 2
  Filled 2013-02-25: qty 1
  Filled 2013-02-25: qty 2
  Filled 2013-02-25: qty 1
  Filled 2013-02-25 (×2): qty 2

## 2013-02-25 MED ORDER — FLUOXETINE HCL 10 MG PO TABS
5.0000 mg | ORAL_TABLET | Freq: Every morning | ORAL | Status: DC
  Filled 2013-02-25: qty 1

## 2013-02-25 MED ORDER — FLUOXETINE HCL 20 MG/5ML PO SOLN
5.0000 mg | Freq: Every day | ORAL | Status: DC
Start: 2013-02-25 — End: 2013-03-04
  Administered 2013-02-25 – 2013-03-04 (×8): 5 mg via ORAL
  Filled 2013-02-25 (×8): qty 5

## 2013-02-25 MED ORDER — SODIUM CHLORIDE 0.9 % IJ SOLN
3.0000 mL | Freq: Two times a day (BID) | INTRAMUSCULAR | Status: DC
  Administered 2013-02-25 – 2013-03-11 (×13): 3 mL via INTRAVENOUS

## 2013-02-25 MED ORDER — WARFARIN VIDEO
Freq: Once | Status: AC
  Administered 2013-02-26: 09:00:00

## 2013-02-25 MED ORDER — INFLUENZA VAC SPLIT QUAD 0.5 ML IM SUSP
0.5000 mL | INTRAMUSCULAR | Status: DC
  Filled 2013-02-25: qty 0.5

## 2013-02-25 MED ORDER — DARBEPOETIN ALFA-POLYSORBATE 100 MCG/0.5ML IJ SOLN
100.0000 ug | INTRAMUSCULAR | Status: DC
  Administered 2013-03-05: 100 ug via INTRAVENOUS
  Filled 2013-02-25 (×2): qty 0.5

## 2013-02-25 MED ORDER — INSULIN ASPART 100 UNIT/ML ~~LOC~~ SOLN
0.0000 [IU] | Freq: Three times a day (TID) | SUBCUTANEOUS | Status: DC
  Administered 2013-02-25: 1 [IU] via SUBCUTANEOUS
  Administered 2013-02-25: 3 [IU] via SUBCUTANEOUS
  Administered 2013-02-26: 2 [IU] via SUBCUTANEOUS
  Administered 2013-02-27: 5 [IU] via SUBCUTANEOUS
  Administered 2013-02-28: 3 [IU] via SUBCUTANEOUS
  Administered 2013-02-28 (×2): 2 [IU] via SUBCUTANEOUS
  Administered 2013-03-01 (×2): 7 [IU] via SUBCUTANEOUS
  Administered 2013-03-02: 2 [IU] via SUBCUTANEOUS
  Administered 2013-03-02: 3 [IU] via SUBCUTANEOUS
  Administered 2013-03-03: 1 [IU] via SUBCUTANEOUS

## 2013-02-25 MED ORDER — INSULIN ASPART 100 UNIT/ML ~~LOC~~ SOLN
4.0000 [IU] | Freq: Every day | SUBCUTANEOUS | Status: DC
  Administered 2013-02-25: 4 [IU] via SUBCUTANEOUS

## 2013-02-25 MED ORDER — PANTOPRAZOLE SODIUM 40 MG PO TBEC
40.0000 mg | DELAYED_RELEASE_TABLET | Freq: Every day | ORAL | Status: DC
  Administered 2013-02-25 – 2013-03-02 (×6): 40 mg via ORAL
  Filled 2013-02-25 (×6): qty 1

## 2013-02-25 MED ORDER — COUMADIN BOOK
Freq: Once | Status: AC
  Administered 2013-02-25: 22:00:00
  Filled 2013-02-25: qty 1

## 2013-02-25 MED ORDER — SIMVASTATIN 5 MG PO TABS
5.0000 mg | ORAL_TABLET | Freq: Every day | ORAL | Status: DC
  Administered 2013-02-25 – 2013-03-10 (×14): 5 mg via ORAL
  Filled 2013-02-25 (×15): qty 1

## 2013-02-25 MED ORDER — LEVOTHYROXINE SODIUM 112 MCG PO TABS
112.0000 ug | ORAL_TABLET | Freq: Every day | ORAL | Status: DC
  Administered 2013-02-25 – 2013-03-10 (×14): 112 ug via ORAL
  Filled 2013-02-25 (×18): qty 1

## 2013-02-25 MED ORDER — NA FERRIC GLUC CPLX IN SUCROSE 12.5 MG/ML IV SOLN
62.5000 mg | INTRAVENOUS | Status: DC
  Administered 2013-02-25: 62.5 mg via INTRAVENOUS
  Filled 2013-02-25 (×2): qty 5

## 2013-02-25 MED ORDER — CALCIUM ACETATE (PHOS BINDER) 667 MG PO TABS
1334.0000 mg | ORAL_TABLET | Freq: Three times a day (TID) | ORAL | Status: DC
  Administered 2013-02-25 – 2013-02-26 (×3): 2 via ORAL
  Filled 2013-02-25 (×6): qty 2

## 2013-02-25 MED ORDER — INSULIN GLARGINE 100 UNIT/ML ~~LOC~~ SOLN
6.0000 [IU] | Freq: Every day | SUBCUTANEOUS | Status: DC
Start: 2013-02-25 — End: 2013-02-25

## 2013-02-25 NOTE — Consult Note (Signed)
HPI: Marcus [name] is an 57 y.o. male with complex medical history. Recently underwent open thyroid exploration for enlarged goiter with substernal extension and compressive sxs. Resection was not possible, but LN and thyroid biopsies were taken. The patient subsequently developed acute (R)UE edema and was seen at [facility]. Venous US study at [facility] showed Rt IJ DVT. CT scan s showed occlusion of brachiocephalic stent. IR asked to review for poss thrombectomy/thrombolysis. Chart and all new and prior imaging reviewed with Dr. Fredia Sorrow. Pt has (R)UE AVF/shuntogram study from 2013 showing that central/stent occlusion was present at that time. There were numerous collaterals as well feeding into the jugular system. Pt reports (R)UE edema has significantly improved since onset as well.  Past Medical History:  Past Medical History  Diagnosis Date  . Hypothyroidism   . Hyperlipidemia   . CVA (cerebrovascular accident)     hx CVA x 2  . GI bleed     2006, mallory-weiss tear  . Pulmonary edema     nov 2011, dry wt decreased  . Goiter     by CT Apr 2013  . Depression   . Cocaine abuse     past history  . Tobacco abuse     history of  . GERD (gastroesophageal reflux disease)   . Cataract   . Insomnia   . Chronic pain   . HTN (hypertension)     sees Dr. Ardelle Park, Rosalita Levan  . Neuromuscular disorder     diabetic neuropathy  . Occlusion of carotid stent     Per patients nursing aid  . ESRD (end stage renal disease) on dialysis     hd - [facility] - [facility]  . Pneumonia     hx of  . Diabetes mellitus     Sees Dr. Reather Littler [phone] -- fasting blood 70-110s  . DKA (diabetic ketoacidosis)     severe, Dec 2011  . History of blood transfusion   . On home oxygen therapy     2l nasal cannula at night    Surgical History:  Past Surgical History  Procedure Laterality Date  . Av fistula placement  03/25/2008    right forearm  . Av fistula placement  04/23/2012    Procedure:  INSERTION OF ARTERIOVENOUS (AV) GORE-TEX GRAFT THIGH;  Surgeon: Sherren Kerns, MD;  Location: [facility] OR;  Service: Vascular;  Laterality: Left;  . Arteriovenous graft placement Left     thigh  . Carotid stent insertion Right   . Dyalisis catheter    . Eye surgery Bilateral     cataracts  . Thyroid surgery  01/28/2013    Dr Gerrit Friends  . Lymph node biopsy Right 01/28/2013    Procedure: LYMPH NODE BIOPSY;  Surgeon: Velora Heckler, MD;  Location: [facility] OR;  Service: General;  Laterality: Right;  . Mass biopsy N/A 01/28/2013    Procedure: NECK EXPLORATION;  Surgeon: Velora Heckler, MD;  Location: [facility] OR;  Service: General;  Laterality: N/A;    Family History: No family history on file.  Social History:  reports that he has been smoking Cigarettes.  He has been smoking about 0.25 packs per day. He has never used smokeless tobacco. He reports that he does not drink alcohol or use illicit drugs.  Allergies:  Allergies  Allergen Reactions  . Penicillins     Medications:  Prior to Admission:  Prescriptions prior to admission  Medication Sig Dispense Refill  . amLODipine (NORVASC) 10 MG tablet Take 10 mg by  mouth at bedtime.       Marland Kitchen aspirin EC 81 MG tablet Take 81 mg by mouth daily.      . calcium acetate (PHOSLO) 667 MG tablet Take 1,334 mg by mouth 3 (three) times daily.       . cloNIDine (CATAPRES) 0.1 MG tablet Take 0.1 mg by mouth 3 (three) times daily.      Marland Kitchen docusate sodium (COLACE) 100 MG capsule Take 100 mg by mouth 2 (two) times daily.       Marland Kitchen EASY TOUCH INSULIN SYRINGE 31G X 5/16" 0.5 ML MISC       . epoetin alfa (EPOGEN,PROCRIT) 2000 UNIT/ML injection Inject 12,400 Units into the skin every Monday, Wednesday, and Friday with hemodialysis.      Marland Kitchen FLUoxetine (PROZAC) 10 MG tablet Take 5 mg by mouth every morning.       . heparin 1000 unit/mL SOLN injection 2,400 Units by Dialysis route every Monday, Wednesday, and Friday with hemodialysis. 2400 Units = 2.4 mL      .  HYDROcodone-acetaminophen (NORCO/VICODIN) 5-325 MG per tablet Take 1-2 tablets by mouth every 4 (four) hours as needed for pain.  20 tablet  0  . insulin aspart (NOVOLOG) 100 UNIT/ML injection Inject 4-6 Units into the skin 3 (three) times daily before meals. Inject 4 units after breakfast, 5 units after lunch, and 6 units after night      . insulin glargine (LANTUS) 100 UNIT/ML injection Inject 9-13 Units into the skin 2 (two) times daily. Inject 13 units in the morning, and 9 units in the evening.      Marland Kitchen levothyroxine (SYNTHROID, LEVOTHROID) 112 MCG tablet Take 112 mcg by mouth daily before breakfast.      . metoprolol (LOPRESSOR) 50 MG tablet Take 75 mg by mouth 2 (two) times daily. Hold morning dose on dialysis days      . nystatin (MYCOSTATIN) 100000 UNIT/ML suspension       . omeprazole (PRILOSEC) 20 MG capsule Take 20 mg by mouth daily.      . paricalcitol (ZEMPLAR) 2 MCG/ML injection Inject 2 mcg into the vein every Monday, Wednesday, and Friday with hemodialysis.      Marland Kitchen pravastatin (PRAVACHOL) 10 MG tablet Take 10 mg by mouth at bedtime.      Marland Kitchen PROAIR HFA 108 (90 BASE) MCG/ACT inhaler Inhale 2 puffs into the lungs every 4 (four) hours as needed for wheezing or shortness of breath.       . promethazine (PHENERGAN) 25 MG tablet Take 25 mg by mouth every 6 (six) hours as needed. For nausea      . sevelamer carbonate (RENVELA) 800 MG tablet Take 800 mg by mouth 3 (three) times daily with meals.       . temazepam (RESTORIL) 15 MG capsule Take 15 mg by mouth 4 (four) times a week. Monday, Wednesday, Friday, saturday        ROS: See HPI for pertinent findings, otherwise complete 10 system review negative.  Physical Exam: Blood pressure 151/63, pulse 70, temperature 97.4 F (36.3 C), temperature source Oral, resp. rate 16, height 6\' 2"  (1.88 m), weight 137 lb 2 oz (62.2 kg), SpO2 100.00%. Anterior neck with significant edema and mass effect. Edema extending from right neck/shoulder region to  mid-forearm. Edema is soft and minimally tender. Hand is warm, excellent radial pulse and normal sensation at the fingertips.    Labs: CBC  Recent Labs  02/25/13 0556  WBC 5.1  HGB 11.0*  HCT 35.0*  PLT 256   MET  Recent Labs  02/25/13 0556  NA 131*  K 3.9  CL 90*  CO2 25  GLUCOSE 281*  BUN 24*  CREATININE 5.27*  CALCIUM 8.6     Assessment/Plan: (R)IJ DVT (R)UE edema likely from DVT or venous insufficiency of collaterals returning via jugular system. Native veins from subclavian distally were noted to be patent on [place]. Chronic central occlusion including stent. Pt has improved significantly with regards to edema, on IV heparin. Dr. Fredia Sorrow does not feel thrombectomy or lysis is appropriate at this time. Continue with IV heparin, (R) UE elevation. Call IR/Dr. Fredia Sorrow with questions.  Brayton El PA-C 02/25/2013, 11:10 AM

## 2013-02-25 NOTE — H&P (Signed)
[facility] History and Physical  Marcus [name] ZOX:[id number] DOB: 02/04/1956 DOA: 02/25/2013  Referring physician: ED PCP: Galvin Proffer, MD  Chief Complaint: R arm swelling  HPI: Marcus [name] is a 57 y.o. male with h/o ESRD (HD MWF) who presents to  hospital this evening with worsening R arm swelling over the past week.  His current HD access is in his L thigh and unrelated to this.  This occurs in the context of recent attempt at thyroidectomy performed last month by Dr. Gerrit Friends.  In the ED at [facility] he was found to have not only RUE DVT but thrombus in his R IJ as well.  The enlargement of the thyroid appears to be extending down into his thoracic cavity (unchanged since prior CT).  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  Given the recent surgery and IJ thrombus, I spoke with Dr. Gerrit Friends on the phone, but Dr. Gerrit Friends assures me that the surgery came no where near the IJ.  He states that after making his initial incision, he found a "rock hard, woody indurated thyroid tissue" he was highly concerned for malignancy, and this tissue extended well down into the thoracic cavity, he performed 2 lymph node biopsies and a thyroid biopsy and then ended the procedure (for further details please see his op note in Epic on 01/29/13 which is also reviewed).  Biopsy sections were suggestive of thyroiditis and not malignancy.  Review of Systems: 12 systems reviewed and otherwise negative.  Past Medical History  Diagnosis Date  . Hypothyroidism   . Hyperlipidemia   . CVA (cerebrovascular accident)     hx CVA x 2  . GI bleed     2006, mallory-weiss tear  . Pulmonary edema     nov 2011, dry wt decreased  . Goiter     by CT Apr 2013  . Depression   . Cocaine abuse     past history  . Tobacco abuse     history of  . GERD (gastroesophageal reflux disease)   . Cataract   . Insomnia   . Chronic pain   . HTN  (hypertension)     sees Dr. Ardelle Park, Rosalita Levan  . Neuromuscular disorder     diabetic neuropathy  . Occlusion of carotid stent     Per patients nursing aid  . ESRD (end stage renal disease) on dialysis     hd - [facility] - [place]  . Pneumonia     hx of  . Diabetes mellitus     Sees Dr. Reather Littler [phone] -- fasting blood 70-110s  . DKA (diabetic ketoacidosis)     severe, Dec 2011  . History of blood transfusion   . On home oxygen therapy     2l nasal cannula at night   Past Surgical History  Procedure Laterality Date  . Av fistula placement  03/25/2008    right forearm  . Av fistula placement  04/23/2012    Procedure: INSERTION OF ARTERIOVENOUS (AV) GORE-TEX GRAFT THIGH;  Surgeon: Sherren Kerns, MD;  Location: [facility] OR;  Service: Vascular;  Laterality: Left;  . Arteriovenous graft placement Left     thigh  . Carotid stent insertion Right   . Dyalisis catheter    . Eye surgery Bilateral     cataracts  . Thyroid surgery  01/28/2013    Dr Gerrit Friends  . Lymph node biopsy Right 01/28/2013    Procedure:  LYMPH NODE BIOPSY;  Surgeon: Velora Heckler, MD;  Location: [facility] OR;  Service: General;  Laterality: Right;  . Mass biopsy N/A 01/28/2013    Procedure: NECK EXPLORATION;  Surgeon: Velora Heckler, MD;  Location: [facility] OR;  Service: General;  Laterality: N/A;   Social History:  reports that he has been smoking Cigarettes.  He has been smoking about 0.25 packs per day. He has never used smokeless tobacco. He reports that he does not drink alcohol or use illicit drugs.   Allergies  Allergen Reactions  . Penicillins     No family history on file. Prior to Admission medications   Medication Sig Start Date End Date Taking? Authorizing Provider  amLODipine (NORVASC) 10 MG tablet Take 10 mg by mouth at bedtime.     Historical Provider, MD  aspirin EC 81 MG tablet Take 81 mg by mouth daily.    Historical Provider, MD  calcium acetate (PHOSLO) 667 MG tablet Take 1,334 mg by mouth 3 (three) times  daily.  12/19/12   Historical Provider, MD  cloNIDine (CATAPRES) 0.1 MG tablet Take 0.1 mg by mouth 3 (three) times daily.    Historical Provider, MD  docusate sodium (COLACE) 100 MG capsule Take 100 mg by mouth 2 (two) times daily.     Historical Provider, MD  EASY TOUCH INSULIN SYRINGE 31G X 5/16" 0.5 ML MISC  12/19/12   Historical Provider, MD  epoetin alfa (EPOGEN,PROCRIT) 2000 UNIT/ML injection Inject 12,400 Units into the skin every Monday, Wednesday, and Friday with hemodialysis.    Historical Provider, MD  FLUoxetine (PROZAC) 10 MG tablet Take 5 mg by mouth every morning.     Historical Provider, MD  heparin 1000 unit/mL SOLN injection 2,400 Units by Dialysis route every Monday, Wednesday, and Friday with hemodialysis. 2400 Units = 2.4 mL    Historical Provider, MD  HYDROcodone-acetaminophen (NORCO/VICODIN) 5-325 MG per tablet Take 1-2 tablets by mouth every 4 (four) hours as needed for pain. 02/06/13   Velora Heckler, MD  insulin aspart (NOVOLOG) 100 UNIT/ML injection Inject 4-6 Units into the skin 3 (three) times daily before meals. Inject 4 units after breakfast, 5 units after lunch, and 6 units after night    Historical Provider, MD  insulin glargine (LANTUS) 100 UNIT/ML injection Inject 9-13 Units into the skin 2 (two) times daily. Inject 13 units in the morning, and 9 units in the evening.    Historical Provider, MD  levothyroxine (SYNTHROID, LEVOTHROID) 112 MCG tablet Take 112 mcg by mouth daily before breakfast.    Historical Provider, MD  metoprolol (LOPRESSOR) 50 MG tablet Take 75 mg by mouth 2 (two) times daily. Hold morning dose on dialysis days    Historical Provider, MD  nystatin (MYCOSTATIN) 100000 UNIT/ML suspension  02/06/13   Historical Provider, MD  omeprazole (PRILOSEC) 20 MG capsule Take 20 mg by mouth daily.    Historical Provider, MD  paricalcitol [facility]) 2 MCG/ML injection Inject 2 mcg into the vein every Monday, Wednesday, and Friday with hemodialysis.    Historical  Provider, MD  pravastatin (PRAVACHOL) 10 MG tablet Take 10 mg by mouth at bedtime.    Historical Provider, MD  PROAIR HFA 108 (90 BASE) MCG/ACT inhaler Inhale 2 puffs into the lungs every 4 (four) hours as needed for wheezing or shortness of breath.  12/08/12   Historical Provider, MD  promethazine (PHENERGAN) 25 MG tablet Take 25 mg by mouth every 6 (six) hours as needed. For nausea    Historical  Provider, MD  sevelamer carbonate (RENVELA) 800 MG tablet Take 800 mg by mouth 3 (three) times daily with meals.  10/23/12   Historical Provider, MD  temazepam (RESTORIL) 15 MG capsule Take 15 mg by mouth 4 (four) times a week. Monday, Wednesday, Friday, saturday    Historical Provider, MD   Physical Exam: Filed Vitals:   02/25/13 0103  BP: 162/68  Pulse: 62  Temp: 97.1 F (36.2 C)  Resp: 16    General:  NAD, resting comfortably in bed Eyes: PEERLA EOMI ENT: mucous membranes moist Neck: supple w/o JVD Cardiovascular: RRR w/o MRG Respiratory: CTA B Abdomen: soft, nt, nd, bs+ Skin: no rash nor lesion Musculoskeletal: MAE, full ROM all 4 extremities Psychiatric: normal tone and affect Neurologic: AAOx3, grossly non-focal  Labs on Admission:  Basic Metabolic Panel: No results found for this basename: NA, K, CL, CO2, GLUCOSE, BUN, CREATININE, CALCIUM, MG, PHOS,  in the last 168 hours Liver Function Tests: No results found for this basename: AST, ALT, ALKPHOS, BILITOT, PROT, ALBUMIN,  in the last 168 hours No results found for this basename: LIPASE, AMYLASE,  in the last 168 hours No results found for this basename: AMMONIA,  in the last 168 hours CBC: No results found for this basename: WBC, NEUTROABS, HGB, HCT, MCV, PLT,  in the last 168 hours Cardiac Enzymes: No results found for this basename: CKTOTAL, CKMB, CKMBINDEX, TROPONINI,  in the last 168 hours  BNP (last 3 results) No results found for this basename: PROBNP,  in the last 8760 hours CBG: No results found for this basename:  GLUCAP,  in the last 168 hours  Radiological Exams on Admission: No results found.  EKG: Independently reviewed.  Assessment/Plan Principal Problem:   Jugular vein thrombosis, right Active Problems:   ESRD (end stage renal disease) on dialysis   Type I (juvenile type) diabetes mellitus without mention of complication, uncontrolled   Thyroid goiter   DVT of axillary vein, acute right   Thyroid mass   1. Thrombus of axillary and brachiocephalic vein on the R side, as well as R IJ - patient put on heparin gtt very complex situation, seems most likely that the RIJ thrombus is secondary to direct compression of the thyroid which is already known to be causing compression of the patients airway (airway compression unchanged since last CT), regarding the RIJ likely will need IR to evaluate along with general surgery and CTS in AM (spoke with Dr. Gerrit Friends on phone and he is unsure if surgery would even want to attempt this here of if patient may need to ultimately be transferred to a tertiary care center for operative management).  IR evaluation regarding possible thrombectomy of any intracranial component of clot or if this is even indicated. 2. ESRD - called and left message with nephrology, dialysis MWF, next dialysis wed 3. DM1 - continue home lantus, mealtime coverage, and putting patient on low dose SSI     Code Status: Full Code (must indicate code status--if unknown or must be presumed, indicate so) Family Communication: No family in room (indicate person spoken with, if applicable, with phone number if by telephone) Disposition Plan: Admit to inpatient (indicate anticipated LOS)  Time spent: 70 min  GARDNER, JARED M. [facility] Pager [phone]  If 7PM-7AM, please contact night-coverage [email or web address] Password [email or web address] 02/25/2013, 3:06 AM

## 2013-02-25 NOTE — Progress Notes (Signed)
INITIAL NUTRITION ASSESSMENT  DOCUMENTATION CODES Per approved criteria  -Severe malnutrition in the context of chronic illness   INTERVENTION: Downgrade diet to Dysphagia 3; maintain renal restrictions. Add Nepro Shake po BID, each supplement provides 425 kcal and 19 grams protein. RD to complete education at a later date. RD to continue to follow nutrition care plan.  NUTRITION DIAGNOSIS: Increased nutrient needs related to HD as evidenced by estimated needs.   Goal: Intake to meet >90% of estimated nutrition needs.  Monitor:  weight trends, lab trends, I/O's, PO intake, supplement tolerance  Reason for Assessment: Education Consult  57 y.o. male  Admitting Dx: Jugular vein thrombosis, right  ASSESSMENT: PMHx significant for ESRD (HD on MWF), CVA x 2, hypothyroidism, cocaine abuse. Admitted with worsening R arm swelling x 1 week. Work-up reveals RUE DVT and thrombus in R IJ. Pt is s/p recent thyroidectomy attempt.  RD consulted 2/2 patient's need for renal education. Pt is not appropriate for education at this time, asking this RD to return at later time.  Pt is underweight. Very thin lower extremities and swollen R arm. Pt has difficulty speaking (hx of CVA) - will downgrade diet, discussed with RN. Pt states that he feels as though he has lost weight, however per weight hx, weight is stable, however pt also currently has a very swollen upper extremity. Pt reports poor appetite, agreeable to Nepro Shakes.  Nutrition Focused Physical Exam:  Subcutaneous Fat:  Orbital Region: WNL Upper Arm Region: WNL Thoracic and Lumbar Region: moderate depletion  Muscle:  Temple Region: WNL Clavicle Bone Region: WNL Clavicle and Acromion Bone Region: WNL Scapular Bone Region: n/a Dorsal Hand: n/a Patellar Region: severe depletion Anterior Thigh Region: severe depletion Posterior Calf Region: severe depletion  Edema: R upper extremity very edematous  Pt meets criteria for severe  MALNUTRITION in the context of chronic illness as evidenced by severe muscle depletion and suspected intake of <75% x at least 1 month.   Height: Ht Readings from Last 1 Encounters:  02/25/13 6\' 2"  (1.88 m)    Weight: Wt Readings from Last 1 Encounters:  02/25/13 137 lb 2 oz (62.2 kg)    Ideal Body Weight: 190 lb  % Ideal Body Weight: 72%  Wt Readings from Last 10 Encounters:  02/25/13 137 lb 2 oz (62.2 kg)  02/20/13 140 lb (63.504 kg)  01/29/13 133 lb 2.5 oz (60.4 kg)  01/29/13 133 lb 2.5 oz (60.4 kg)  01/23/13 140 lb 6.4 oz (63.685 kg)  01/16/13 136 lb 9.6 oz (61.961 kg)  12/31/12 140 lb 12.8 oz (63.866 kg)  04/18/12 147 lb 4.8 oz (66.815 kg)  03/28/12 145 lb (65.772 kg)  03/28/12 145 lb (65.772 kg)    Usual Body Weight: 135 - 145 lb  % Usual Body Weight: 100%  BMI:  Body mass index is 17.6 kg/(m^2). Underweight  Estimated Nutritional Needs: Kcal: 1800 - 2000 Protein: 75 - 90 g  Fluid: 1.2 liters  Skin: L foot skin tear  Diet Order: Renal 67-70  EDUCATION NEEDS: -Education not appropriate at this time   Intake/Output Summary (Last 24 hours) at 02/25/13 0914 Last data filed at 02/25/13 0300  Gross per 24 hour  Intake    240 ml  Output      0 ml  Net    240 ml    Last BM: skin tear of L foot   Labs:   Recent Labs Lab 02/25/13 0556  NA 131*  K 3.9  CL 90*  CO2 25  BUN 24*  CREATININE 5.27*  CALCIUM 8.6  GLUCOSE 281*    CBG (last 3)   Recent Labs  02/25/13 0343  GLUCAP 254*    Scheduled Meds: . amLODipine  10 mg Oral QHS  . aspirin EC  81 mg Oral Daily  . calcium acetate  1,334 mg Oral TID  . cloNIDine  0.1 mg Oral TID  . docusate sodium  100 mg Oral BID  . FLUoxetine  5 mg Oral Daily  . [START ON 02/26/2013] influenza vac split quadrivalent PF  0.5 mL Intramuscular Tomorrow-1000  . insulin aspart  0-9 Units Subcutaneous TID WC  . insulin aspart  4 Units Subcutaneous QAC breakfast  . insulin aspart  5 Units Subcutaneous Q  lunch  . insulin aspart  6 Units Subcutaneous QPC supper  . insulin glargine  13 Units Subcutaneous Daily  . insulin glargine  9 Units Subcutaneous QHS  . levothyroxine  112 mcg Oral QAC breakfast  . metoprolol  75 mg Oral BID  . pantoprazole  40 mg Oral Daily  . [START ON 02/26/2013] paricalcitol  2 mcg Intravenous Q M,W,F-HD  . sevelamer carbonate  800 mg Oral TID WC  . simvastatin  5 mg Oral q1800  . sodium chloride  3 mL Intravenous Q12H  . temazepam  15 mg Oral Custom    Continuous Infusions: . heparin 1,000 Units/hr (02/25/13 1191)    Past Medical History  Diagnosis Date  . Hypothyroidism   . Hyperlipidemia   . CVA (cerebrovascular accident)     hx CVA x 2  . GI bleed     2006, mallory-weiss tear  . Pulmonary edema     nov 2011, dry wt decreased  . Goiter     by CT Apr 2013  . Depression   . Cocaine abuse     past history  . Tobacco abuse     history of  . GERD (gastroesophageal reflux disease)   . Cataract   . Insomnia   . Chronic pain   . HTN (hypertension)     sees Dr. Ardelle Park, Rosalita Levan  . Neuromuscular disorder     diabetic neuropathy  . Occlusion of carotid stent     Per patients nursing aid  . ESRD (end stage renal disease) on dialysis     hd - [facility] - [place]  . Pneumonia     hx of  . Diabetes mellitus     Sees Dr. Reather Littler [phone] -- fasting blood 70-110s  . DKA (diabetic ketoacidosis)     severe, Dec 2011  . History of blood transfusion   . On home oxygen therapy     2l nasal cannula at night    Past Surgical History  Procedure Laterality Date  . Av fistula placement  03/25/2008    right forearm  . Av fistula placement  04/23/2012    Procedure: INSERTION OF ARTERIOVENOUS (AV) GORE-TEX GRAFT THIGH;  Surgeon: Sherren Kerns, MD;  Location: [facility] OR;  Service: Vascular;  Laterality: Left;  . Arteriovenous graft placement Left     thigh  . Carotid stent insertion Right   . Dyalisis catheter    . Eye surgery Bilateral     cataracts  .  Thyroid surgery  01/28/2013    Dr Gerrit Friends  . Lymph node biopsy Right 01/28/2013    Procedure: LYMPH NODE BIOPSY;  Surgeon: Velora Heckler, MD;  Location: [facility] OR;  Service: General;  Laterality: Right;  . Mass  biopsy N/A 01/28/2013    Procedure: NECK EXPLORATION;  Surgeon: Velora Heckler, MD;  Location: [facility] OR;  Service: General;  Laterality: N/A;    Jarold Motto MS, RD, LDN Pager: [phone] After-hours pager: [phone]

## 2013-02-25 NOTE — Progress Notes (Signed)
CBG: 59 Treatment: 15 GM carbohydrate snack  Symptoms: Shaky and Hungry  Follow-up CBG: Time:1400 CBG Result:128  Possible Reasons for Event: Inadequate meal intake  Comments/MD notified: Dhungel

## 2013-02-25 NOTE — Consult Note (Signed)
General Surgery [facility]  CC:  Right upper extremity swelling   HISTORY: Patient is a 57 yo BM known to my surgical service.  Patient underwent neck exploration last month for thyroid goiter with compressive symptoms.  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  Biopsy shows lymphocytic thyroiditis.  Procedure aborted due to extensive nature of "tumor" and probable need for median sternotomy for resection.  Patient presented to ER at [facility] yesterday evening with right upper extremity swelling and pain.  Evaluation by report shows acute thrombosis of right venous system extending into the right internal jugular vein.  Patient has had numerous procedures with stenting in the past.  Studies from [place] are not yet available for review on our Epic system.  Patient states that swelling and pain is much improved this AM.  Anticoagulation initiated last night with IV heparin.  Past Medical History  Diagnosis Date  . Hypothyroidism   . Hyperlipidemia   . CVA (cerebrovascular accident)     hx CVA x 2  . GI bleed     2006, mallory-weiss tear  . Pulmonary edema     nov 2011, dry wt decreased  . Goiter     by CT Apr 2013  . Depression   . Cocaine abuse     past history  . Tobacco abuse     history of  . GERD (gastroesophageal reflux disease)   . Cataract   . Insomnia   . Chronic pain   . HTN (hypertension)     sees Dr. Ardelle Park, Rosalita Levan  . Neuromuscular disorder     diabetic neuropathy  . Occlusion of carotid stent     Per patients nursing aid  . ESRD (end stage renal disease) on dialysis     hd - [facility] - [place]  . Pneumonia     hx of  . Diabetes mellitus     Sees Dr. Reather Littler [phone] -- fasting blood 70-110s  . DKA (diabetic ketoacidosis)     severe, Dec 2011  . History of blood transfusion   . On home oxygen therapy     2l nasal cannula at night    Current  Facility-Administered Medications  Medication Dose Route Frequency Provider Last Rate Last Dose  . albuterol (PROVENTIL HFA;VENTOLIN HFA) 108 (90 BASE) MCG/ACT inhaler 2 puff  2 puff Inhalation Q4H PRN Hillary Bow, DO      . amLODipine (NORVASC) tablet 10 mg  10 mg Oral QHS Hillary Bow, DO      . aspirin EC tablet 81 mg  81 mg Oral Daily Hillary Bow, DO   81 mg at 02/25/13 8657  . calcium acetate (PHOSLO) tablet 2 tablet  1,334 mg Oral TID Hillary Bow, DO   2 tablet at 02/25/13 0915  . cloNIDine (CATAPRES) tablet 0.1 mg  0.1 mg Oral TID Hillary Bow, DO      . docusate sodium (COLACE) capsule 100 mg  100 mg Oral BID Hillary Bow, DO   100 mg at 02/25/13 0427  . FLUoxetine (PROZAC) 20 MG/5ML solution 5 mg  5 mg Oral Daily Nishant Dhungel, MD      . heparin ADULT infusion 100 units/mL (25000 units/250 mL)  1,000 Units/hr Intravenous Continuous Anh P Pham, RPH 10 mL/hr at 02/25/13 0839 1,000 Units/hr at 02/25/13 0839  . HYDROcodone-acetaminophen (NORCO/VICODIN) 5-325 MG per tablet 1-2 tablet  1-2 tablet Oral  Q4H PRN Hillary Bow, DO      . [START ON 02/26/2013] influenza vac split quadrivalent PF (FLUARIX) injection 0.5 mL  0.5 mL Intramuscular Tomorrow-1000 Nishant Dhungel, MD      . insulin aspart (novoLOG) injection 0-9 Units  0-9 Units Subcutaneous TID WC Hillary Bow, DO   3 Units at 02/25/13 1610  . insulin aspart (novoLOG) injection 4 Units  4 Units Subcutaneous QAC breakfast Hillary Bow, DO   4 Units at 02/25/13 9604  . insulin aspart (novoLOG) injection 5 Units  5 Units Subcutaneous Q lunch Nishant Dhungel, MD      . insulin aspart (novoLOG) injection 6 Units  6 Units Subcutaneous QPC supper Nishant Dhungel, MD      . insulin glargine (LANTUS) injection 13 Units  13 Units Subcutaneous Daily Hillary Bow, DO   13 Units at 02/25/13 0915  . insulin glargine (LANTUS) injection 9 Units  9 Units Subcutaneous QHS Nishant Dhungel, MD      . levothyroxine (SYNTHROID,  LEVOTHROID) tablet 112 mcg  112 mcg Oral QAC breakfast Hillary Bow, DO   112 mcg at 02/25/13 0743  . metoprolol tartrate (LOPRESSOR) tablet 75 mg  75 mg Oral BID Hillary Bow, DO   75 mg at 02/25/13 0913  . pantoprazole (PROTONIX) EC tablet 40 mg  40 mg Oral Daily Hillary Bow, DO   40 mg at 02/25/13 0913  . [START ON 02/26/2013] paricalcitol (ZEMPLAR) injection 2 mcg  2 mcg Intravenous Q M,W,F-HD Hillary Bow, DO      . promethazine (PHENERGAN) tablet 25 mg  25 mg Oral Q6H PRN Hillary Bow, DO      . sevelamer carbonate (RENVELA) tablet 800 mg  800 mg Oral TID WC Hillary Bow, DO   800 mg at 02/25/13 0914  . simvastatin (ZOCOR) tablet 5 mg  5 mg Oral q1800 Hillary Bow, DO      . sodium chloride 0.9 % injection 3 mL  3 mL Intravenous Q12H Hillary Bow, DO   3 mL at 02/25/13 0918  . temazepam (RESTORIL) capsule 15 mg  15 mg Oral Custom Nishant Dhungel, MD        Allergies  Allergen Reactions  . Penicillins     No family history on file.  History   Social History  . Marital Status: Married    Spouse Name: N/A    Number of Children: N/A  . Years of Education: N/A   Social History Main Topics  . Smoking status: Current Every Day Smoker -- 0.25 packs/day    Types: Cigarettes  . Smokeless tobacco: Never Used     Comment: pt states that he has been thinking about giving it up  . Alcohol Use: No  . Drug Use: No  . Sexual Activity: Not on file   Other Topics Concern  . Not on file   Social History Narrative  . No narrative on file    REVIEW OF SYSTEMS - PERTINENT POSITIVES ONLY: Right upper extremity swelling and pain, somewhat improved this AM  EXAM: Filed Vitals:   02/25/13 0410  BP: 151/63  Pulse: 70  Temp: 97.4 F (36.3 C)  Resp: 16    HEENT: normocephalic; pupils equal and reactive; sclerae clear; dentition poor; mucous membranes moist NECK:  symmetric on extension; no palpable anterior or posterior cervical lymphadenopathy; healing  surgical incision without evidence of infection; moderate soft tissue swelling with underlying goiter CHEST: Coarse breath  sounds bilaterally CARDIAC: regular rate and rhythm without significant murmur ABDOMEN: soft without distension; bowel sounds present; no mass; no hepatosplenomegaly EXT:  Right upper extremity with moderate soft tissue swelling, mild tenderness   LABORATORY RESULTS: See [facility] ([facility]-Epic) for most recent results  RADIOLOGY RESULTS: See [facility] (CHL-Epic) for most recent results  IMPRESSION: 1. Acute thrombosis right upper extremity venous system extending into right internal jugular vein  On heparin drip per medical service  Would request vascular surgery to evaluate - history of access procedures and stent placements in right upper extremity  Please have CT chest from [facility] 02/24/2013 released to Epic for viewing  2.  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  ESRD on hemodialysis  Per renal service  4.  DM  [facility] medical service  Will follow with you.  Velora Heckler, MD, FACS General & Endocrine Surgery [facility]  Primary Care Physician: Galvin Proffer, MD

## 2013-02-25 NOTE — Progress Notes (Signed)
ANTICOAGULATION CONSULT NOTE - Initial Consult  Pharmacy Consult for heparin Indication: DVT  Allergies  Allergen Reactions  . Penicillins     Patient Measurements: Height: 6\' 2"  (188 cm) Weight: 137 lb 2 oz (62.2 kg) IBW/kg (Calculated) : 82.2  Vital Signs: Temp: 97.1 F (36.2 C) (11/11 0103) Temp src: Oral (11/11 0103) BP: 162/68 mmHg (11/11 0103) Pulse Rate: 62 (11/11 0103)  Estimated Creatinine Clearance: 10.9 ml/min (by C-G formula based on Cr of 6.56).   Medical History: Past Medical History  Diagnosis Date  . Hypothyroidism   . Hyperlipidemia   . CVA (cerebrovascular accident)     hx CVA x 2  . GI bleed     2006, mallory-weiss tear  . Pulmonary edema     nov 2011, dry wt decreased  . Goiter     by CT Apr 2013  . Depression   . Cocaine abuse     past history  . Tobacco abuse     history of  . GERD (gastroesophageal reflux disease)   . Cataract   . Insomnia   . Chronic pain   . HTN (hypertension)     sees Dr. Ardelle Park, Rosalita Levan  . Neuromuscular disorder     diabetic neuropathy  . Occlusion of carotid stent     Per patients nursing aid  . ESRD (end stage renal disease) on dialysis     hd - [facility] - [place]  . Pneumonia     hx of  . Diabetes mellitus     Sees Dr. Reather Littler [phone] -- fasting blood 70-110s  . DKA (diabetic ketoacidosis)     severe, Dec 2011  . History of blood transfusion   . On home oxygen therapy     2l nasal cannula at night    Medications:  Prescriptions prior to admission  Medication Sig Dispense Refill  . amLODipine (NORVASC) 10 MG tablet Take 10 mg by mouth at bedtime.       Marland Kitchen aspirin EC 81 MG tablet Take 81 mg by mouth daily.      . calcium acetate (PHOSLO) 667 MG tablet Take 1,334 mg by mouth 3 (three) times daily.       . cloNIDine (CATAPRES) 0.1 MG tablet Take 0.1 mg by mouth 3 (three) times daily.      Marland Kitchen docusate sodium (COLACE) 100 MG capsule Take 100 mg by mouth 2 (two) times daily.       Marland Kitchen EASY TOUCH INSULIN  SYRINGE 31G X 5/16" 0.5 ML MISC       . epoetin alfa (EPOGEN,PROCRIT) 2000 UNIT/ML injection Inject 12,400 Units into the skin every Monday, Wednesday, and Friday with hemodialysis.      Marland Kitchen FLUoxetine (PROZAC) 10 MG tablet Take 5 mg by mouth every morning.       . heparin 1000 unit/mL SOLN injection 2,400 Units by Dialysis route every Monday, Wednesday, and Friday with hemodialysis. 2400 Units = 2.4 mL      . HYDROcodone-acetaminophen (NORCO/VICODIN) 5-325 MG per tablet Take 1-2 tablets by mouth every 4 (four) hours as needed for pain.  20 tablet  0  . insulin aspart (NOVOLOG) 100 UNIT/ML injection Inject 4-6 Units into the skin 3 (three) times daily before meals. Inject 4 units after breakfast, 5 units after lunch, and 6 units after night      . insulin glargine (LANTUS) 100 UNIT/ML injection Inject 9-13 Units into the skin 2 (two) times daily. Inject 13 units in the morning, and 9  units in the evening.      Marland Kitchen levothyroxine (SYNTHROID, LEVOTHROID) 112 MCG tablet Take 112 mcg by mouth daily before breakfast.      . metoprolol (LOPRESSOR) 50 MG tablet Take 75 mg by mouth 2 (two) times daily. Hold morning dose on dialysis days      . nystatin (MYCOSTATIN) 100000 UNIT/ML suspension       . omeprazole (PRILOSEC) 20 MG capsule Take 20 mg by mouth daily.      . paricalcitol (ZEMPLAR) 2 MCG/ML injection Inject 2 mcg into the vein every Monday, Wednesday, and Friday with hemodialysis.      Marland Kitchen pravastatin (PRAVACHOL) 10 MG tablet Take 10 mg by mouth at bedtime.      Marland Kitchen PROAIR HFA 108 (90 BASE) MCG/ACT inhaler Inhale 2 puffs into the lungs every 4 (four) hours as needed for wheezing or shortness of breath.       . promethazine (PHENERGAN) 25 MG tablet Take 25 mg by mouth every 6 (six) hours as needed. For nausea      . sevelamer carbonate (RENVELA) 800 MG tablet Take 800 mg by mouth 3 (three) times daily with meals.       . temazepam (RESTORIL) 15 MG capsule Take 15 mg by mouth 4 (four) times a week. Monday,  Wednesday, Friday, saturday         Assessment: 57yo male transferred from [facility] with new DVT in IJ, to continue heparin that was started there.  Goal of Therapy:  Heparin level 0.3-0.7 units/ml Monitor platelets by anticoagulation protocol: Yes   Plan:  OSH started heparin with bolus 4000 units and gtt at 800 units/hr at 2200; will continue gtt at current rate for now and monitor heparin levels and CBC.  Vernard Gambles, PharmD, BCPS  02/25/2013,1:35 AM

## 2013-02-25 NOTE — Consult Note (Signed)
[facility] Renal Consultation Note  Indication for Consultation:  Management of ESRD/hemodialysis; anemia, hypertension/volume and secondary hyperparathyroidism  HPI: Marcus [name] is a 57 y.o. male present with progressive right UE edema past week and pain in the right arm and was seen at [facility]. Venous US study at [facility] showed DVT thrombus Axillary and Brachiocephalic Veins and   Rt IJ DVT. 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 Sid Falcon Notes" underwent neck exploration last month for thyroid goiter with compressive symptoms. 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 Biopsy shows lymphocytic thyroiditis. Procedure aborted due to extensive nature of "tumor" and probable need for median sternotomy for resection."  His current HD access is in his L thigh and no reported problems with it yesterday at oupt dialysis in [place]. Currently feeling better with IV heparin drip.   Past Medical History  Diagnosis Date  . Hypothyroidism   . Hyperlipidemia   . CVA (cerebrovascular accident)     hx CVA x 2  . GI bleed     2006, mallory-weiss tear  . Pulmonary edema     nov 2011, dry wt decreased  . Goiter     by CT Apr 2013  . Depression   . Cocaine abuse     past history  . Tobacco abuse     history of  . GERD (gastroesophageal reflux disease)   . Cataract   . Insomnia   . Chronic pain   . HTN (hypertension)     sees Dr. Ardelle Park, Rosalita Levan  . Neuromuscular disorder     diabetic neuropathy  . Occlusion of carotid stent     Per patients nursing aid  . ESRD (end stage renal disease) on dialysis     hd - mwf - [place]  . Pneumonia     hx of  . Diabetes mellitus     Sees Dr.  Reather Littler [phone] -- fasting blood 70-110s  . DKA (diabetic ketoacidosis)     severe, Dec 2011  . History of blood transfusion   . On home oxygen therapy     2l nasal cannula at night    Past Surgical History  Procedure Laterality Date  . Av fistula placement  03/25/2008    right forearm  . Av fistula placement  04/23/2012    Procedure: INSERTION OF ARTERIOVENOUS (AV) GORE-TEX GRAFT THIGH;  Surgeon: Sherren Kerns, MD;  Location: [facility] OR;  Service: Vascular;  Laterality: Left;  . Arteriovenous graft placement Left     thigh  . Carotid stent insertion Right   . Dyalisis catheter    . Eye surgery Bilateral     cataracts  . Thyroid surgery  01/28/2013    Dr Gerrit Friends  . Lymph node biopsy Right 01/28/2013    Procedure: LYMPH NODE BIOPSY;  Surgeon: Velora Heckler, MD;  Location: [facility] OR;  Service: General;  Laterality: Right;  . Mass biopsy N/A 01/28/2013    Procedure: NECK EXPLORATION;  Surgeon: Velora Heckler, MD;  Location: [facility] OR;  Service: General;  Laterality: N/A;     No family history on file. Social = Lives in [place] with daughter and in remote past Cocaine use and   reports  that he has been smoking Cigarettes.  He has been smoking about 0.25 packs per day. He has never used smokeless tobacco. He reports that he does not drink alcohol or use illicit drugs.   Allergies  Allergen Reactions  . Penicillins     Prior to Admission medications   Medication Sig Start Date End Date Taking? Authorizing Provider  amLODipine (NORVASC) 10 MG tablet Take 10 mg by mouth at bedtime.     Historical Provider, MD  aspirin EC 81 MG tablet Take 81 mg by mouth daily.    Historical Provider, MD  calcium acetate (PHOSLO) 667 MG tablet Take 1,334 mg by mouth 3 (three) times daily.  12/19/12   Historical Provider, MD  cloNIDine (CATAPRES) 0.1 MG tablet Take 0.1 mg by mouth 3 (three) times daily.    Historical Provider, MD  docusate sodium (COLACE) 100 MG capsule Take 100 mg by mouth 2 (two) times  daily.     Historical Provider, MD  EASY TOUCH INSULIN SYRINGE 31G X 5/16" 0.5 ML MISC  12/19/12   Historical Provider, MD  epoetin alfa (EPOGEN,PROCRIT) 2000 UNIT/ML injection Inject 12,400 Units into the skin every Monday, Wednesday, and Friday with hemodialysis.    Historical Provider, MD  FLUoxetine (PROZAC) 10 MG tablet Take 5 mg by mouth every morning.     Historical Provider, MD  heparin 1000 unit/mL SOLN injection 2,400 Units by Dialysis route every Monday, Wednesday, and Friday with hemodialysis. 2400 Units = 2.4 mL    Historical Provider, MD  HYDROcodone-acetaminophen (NORCO/VICODIN) 5-325 MG per tablet Take 1-2 tablets by mouth every 4 (four) hours as needed for pain. 02/06/13   Velora Heckler, MD  insulin aspart (NOVOLOG) 100 UNIT/ML injection Inject 4-6 Units into the skin 3 (three) times daily before meals. Inject 4 units after breakfast, 5 units after lunch, and 6 units after night    Historical Provider, MD  insulin glargine (LANTUS) 100 UNIT/ML injection Inject 9-13 Units into the skin 2 (two) times daily. Inject 13 units in the morning, and 9 units in the evening.    Historical Provider, MD  levothyroxine (SYNTHROID, LEVOTHROID) 112 MCG tablet Take 112 mcg by mouth daily before breakfast.    Historical Provider, MD  metoprolol (LOPRESSOR) 50 MG tablet Take 75 mg by mouth 2 (two) times daily. Hold morning dose on dialysis days    Historical Provider, MD  nystatin (MYCOSTATIN) 100000 UNIT/ML suspension  02/06/13   Historical Provider, MD  omeprazole (PRILOSEC) 20 MG capsule Take 20 mg by mouth daily.    Historical Provider, MD  paricalcitol [facility]) 2 MCG/ML injection Inject 2 mcg into the vein every Monday, Wednesday, and Friday with hemodialysis.    Historical Provider, MD  pravastatin (PRAVACHOL) 10 MG tablet Take 10 mg by mouth at bedtime.    Historical Provider, MD  PROAIR HFA 108 (90 BASE) MCG/ACT inhaler Inhale 2 puffs into the lungs every 4 (four) hours as needed for wheezing or  shortness of breath.  12/08/12   Historical Provider, MD  promethazine (PHENERGAN) 25 MG tablet Take 25 mg by mouth every 6 (six) hours as needed. For nausea    Historical Provider, MD  sevelamer carbonate (RENVELA) 800 MG tablet Take 800 mg by mouth 3 (three) times daily with meals.  10/23/12   Historical Provider, MD  temazepam (RESTORIL) 15 MG capsule Take 15 mg by mouth 4 (four) times a week. Monday, Wednesday, Friday, saturday    Historical Provider, MD    YNW:GNFAOZHYQ, HYDROcodone-acetaminophen,  promethazine  Results for orders placed during the hospital encounter of 02/25/13 (from the past 48 hour(s))  MRSA PCR SCREENING     Status: None   Collection Time    02/25/13  1:36 AM      Result Value Range   MRSA by PCR NEGATIVE  NEGATIVE   Comment:            The GeneXpert MRSA Assay (FDA     approved for NASAL specimens     only), is one component of a     comprehensive MRSA colonization     surveillance program. It is not     intended to diagnose MRSA     infection nor to guide or     monitor treatment for     MRSA infections.  GLUCOSE, CAPILLARY     Status: Abnormal   Collection Time    02/25/13  3:43 AM      Result Value Range   Glucose-Capillary 254 (*) 70 - 99 mg/dL  HEPARIN LEVEL (UNFRACTIONATED)     Status: Abnormal   Collection Time    02/25/13  5:56 AM      Result Value Range   Heparin Unfractionated <0.10 (*) 0.30 - 0.70 IU/mL   Comment:            IF HEPARIN RESULTS ARE BELOW     EXPECTED VALUES, AND PATIENT     DOSAGE HAS BEEN CONFIRMED,     SUGGEST FOLLOW UP TESTING     OF ANTITHROMBIN III LEVELS.     REPEATED TO VERIFY  CBC     Status: Abnormal   Collection Time    02/25/13  5:56 AM      Result Value Range   WBC 5.1  4.0 - 10.5 K/uL   RBC 3.57 (*) 4.22 - 5.81 MIL/uL   Hemoglobin 11.0 (*) 13.0 - 17.0 g/dL   HCT 16.1 (*) 09.6 - 04.5 %   MCV 98.0  78.0 - 100.0 fL   MCH 30.8  26.0 - 34.0 pg   MCHC 31.4  30.0 - 36.0 g/dL   RDW 40.9 (*) 81.1 - 91.4 %    Platelets 256  150 - 400 K/uL  BASIC METABOLIC PANEL     Status: Abnormal   Collection Time    02/25/13  5:56 AM      Result Value Range   Sodium 131 (*) 135 - 145 mEq/L   Potassium 3.9  3.5 - 5.1 mEq/L   Chloride 90 (*) 96 - 112 mEq/L   CO2 25  19 - 32 mEq/L   Glucose, Bld 281 (*) 70 - 99 mg/dL   BUN 24 (*) 6 - 23 mg/dL   Creatinine, Ser 7.82 (*) 0.50 - 1.35 mg/dL   Calcium 8.6  8.4 - 95.6 mg/dL   GFR calc non Af Amer 11 (*) >90 mL/min   GFR calc Af Amer 13 (*) >90 mL/min   Comment: (NOTE)     The eGFR has been calculated using the CKD EPI equation.     This calculation has not been validated in all clinical situations.     eGFR's persistently <90 mL/min signify possible Chronic Kidney     Disease.  GLUCOSE, CAPILLARY     Status: Abnormal   Collection Time    02/25/13  8:03 AM      Result Value Range   Glucose-Capillary 249 (*) 70 - 99 mg/dL   Comment 1 Notify RN  Comment 2 Documented in Chart    GLUCOSE, CAPILLARY     Status: Abnormal   Collection Time    02/25/13 11:51 AM      Result Value Range   Glucose-Capillary 59 (*) 70 - 99 mg/dL   Comment 1 Notify RN     Comment 2 Documented in Chart     .  ROS:  See only positives in HPI   Physical Exam: Filed Vitals:   02/25/13 0410  BP: 151/63  Pulse: 70  Temp: 97.4 F (36.3 C)  Resp: 16     General: Thin BM awoken from sleep and in NAD . Slightly slow to response with questions but OX3 HEENT:  [place]  MMM   Eyes: EOMI Neck: swollenl aspect and middle accept with healed surgical scar at Thyroid  Heart:  RRR . No rub or mur Lungs: CTA  Abdomen: BS Pos. Soft ,nontender Extremities: No pedaledema/  R Upper extremity  Edema/  Skin: No overt ulcer, or rash , hard scaly skin  lower extremities Neuro: OX# , moves all extrm Dialysis Access: Pos bruit  L FEM AVGG/ R FA AVGG pos bruit and extensive swelling of area.  Dialysis Orders: Center: [facility]  on mwf . EDW 61 kg HD Bath 2.0 k  Time 2.25 Heparin 2400. Access  l fem  AVGG BFR 450  DFR af 1.5     Hectorol 0 (pth 56) mcg IV/HD Epogen 6800   Units IV/HD  Venofer  50mg  per week     Assessment/Plan 1. R IJ DVT/  andThrombus Axillary and Brachiocephalic Veins= IV heparin IR seeing/ CTS to see and Dr. Gerrit Friends 2. ESRD -  HD MWF ( [facility]) contiue on schedule  3. Hypertension/volume  - 151/63 on Amlodipine 10mg  hs, Clonidine.1mg  tid  Hold am hd, lopressor 50mg  hs( not sure takes appropriately at home) monitor for BP drops with HD / may need to taper down 4. Anemia  - HGB 11.0 continue epo/ venofer on  5. Metabolic bone disease -  No hectorol / Renvela binder and calcium acetate listed/ last phos 2.8on 10/22= dc renvela  6. Thyroiditis lymphocytic-  Dr. Gerrit Friends managing 7. Nutrition - renal . Carb  Mod / renavite 8. Type 1 dm - per admit  Lenny Pastel, PA-C [facility] Beeper [phone] 02/25/2013, 12:06 PM

## 2013-02-25 NOTE — Clinical Documentation Improvement (Signed)
Possible Clinical Conditions? Severe Malnutrition   Protein Calorie Malnutrition Severe Protein Calorie Malnutrition Other Condition Cannot clinically determine  Supporting Information:  INITIAL NUTRITION ASSESSMENT BY Haynes Bast, RD at 02/25/2013 9:14 AM   DOCUMENTATION CODES  Per approved criteria   -Severe malnutrition in the context of chronic illness    Risk Factors:Admitting Dx: Jugular vein thrombosis, right   Signs & Symptoms:"Pt is underweight", "Pt reports poor appetite", "he feels as though he has lost weight"  Diagnostics:" Pt meets criteria for severe MALNUTRITION in the context of chronic illness as evidenced by severe muscle depletion and suspected intake of <75% x at least 1 month"  Treatment: INTERVENTION:  Downgrade diet to Dysphagia 3; maintain renal restrictions.  Add Nepro Shake po BID, each supplement provides 425 kcal and 19 grams protein.  RD to complete education at a later date.  RD to continue to follow nutrition care plan.   Thank You, Dollene Primrose, BSN, CCDS Clinical Documentation Specialist:  [phone]  [facility]- Health Information Management

## 2013-02-25 NOTE — Consult Note (Signed)
I saw the patient and agree with the above assessment and plan.  .  Plan for HD tomorrow. To OR 11/13 for R UE AVG.  On hep gtt.

## 2013-02-25 NOTE — Progress Notes (Signed)
Lab tech unable to draw labs on pt because he has heparin running in his left forearm and his right arm is restricted. On call NP notified, a one time order for a foot draw was given.

## 2013-02-25 NOTE — Progress Notes (Signed)
ANTICOAGULATION CONSULT NOTE - Initial Consult  Pharmacy Consult for heparin Indication: DVT  Allergies  Allergen Reactions  . Penicillins     Patient Measurements: Height: 6\' 2"  (188 cm) Weight: 137 lb 2 oz (62.2 kg) IBW/kg (Calculated) : 82.2 Heparin Dosing Weight: 62kg  Vital Signs: Temp: 97.4 F (36.3 C) (11/11 0410) Temp src: Oral (11/11 0410) BP: 151/63 mmHg (11/11 0410) Pulse Rate: 70 (11/11 0410)  Labs:  Recent Labs  02/25/13 0556  HGB 11.0*  HCT 35.0*  PLT 256  HEPARINUNFRC <0.10*  CREATININE 5.27*    Estimated Creatinine Clearance: 13.6 ml/min (by C-G formula based on Cr of 5.27).   Medical History: Past Medical History  Diagnosis Date  . Hypothyroidism   . Hyperlipidemia   . CVA (cerebrovascular accident)     hx CVA x 2  . GI bleed     2006, mallory-weiss tear  . Pulmonary edema     nov 2011, dry wt decreased  . Goiter     by CT Apr 2013  . Depression   . Cocaine abuse     past history  . Tobacco abuse     history of  . GERD (gastroesophageal reflux disease)   . Cataract   . Insomnia   . Chronic pain   . HTN (hypertension)     sees Dr. Ardelle Park, Rosalita Levan  . Neuromuscular disorder     diabetic neuropathy  . Occlusion of carotid stent     Per patients nursing aid  . ESRD (end stage renal disease) on dialysis     hd - [facility] - [place]  . Pneumonia     hx of  . Diabetes mellitus     Sees Dr. Reather Littler [phone] -- fasting blood 70-110s  . DKA (diabetic ketoacidosis)     severe, Dec 2011  . History of blood transfusion   . On home oxygen therapy     2l nasal cannula at night    Assessment: Patient is a 57 y.o M on heparin for RUE DVT and R IJ thrombus.  Heparin level now back undetectable.  No issue with IV line per RN and no bleeding noted.    Goal of Therapy:  Heparin level 0.3-0.7 units/ml Monitor platelets by anticoagulation protocol: Yes   Plan:  1) heparin 2000 unit bolus, then increase drip to 1000 units/hr 2) check 8  hour heparin level  Salley Boxley P 02/25/2013,8:18 AM

## 2013-02-25 NOTE — Progress Notes (Signed)
[facility] PROGRESS NOTE  Marcus [name] ZOX:[id number] DOB: 1955-10-18 DOA: 02/25/2013 PCP: Galvin Proffer, MD  Assessment/Plan: Rt IJ thrombus placed on heparin drip. Possible from direct compression of IJ by enlarged thyroid. Appreciate gen surgery andf IR evaluation. Vascular  consulted.  CT reports unchanged airway compression. patient in no resp distress. does c/o some dysphagia which is new. Will get swallow eval.  Thyroid mass Thyroiditis per recent bx  ESRD on HD ( M,W,F)  renal following  Type 1 DM Noted for low fsg today. Hold premeal aspart. Continue lantus and SSI only  HTN  continue home meds  Hx of CVA  continue ASA, statin  GERD  continue PPI  DIET: dys level 3    DVT prophylaxis: IV heparin  Code Status: full code Family Communication: none Disposition Plan: pending   Consultants:  CCS  vascular surgery ( Dr Darrick Penna)  consulted   IR  Procedures: none  Antibiotics:  none  HPI/Subjective: Admission H&P reviewed  Objective: Filed Vitals:   02/25/13 0800  BP: 155/77  Pulse: 69  Temp: 98.5 F (36.9 C)  Resp:     Intake/Output Summary (Last 24 hours) at 02/25/13 1431 Last data filed at 02/25/13 1100  Gross per 24 hour  Intake  81191 ml  Output      0 ml  Net  12265 ml   Filed Weights   02/25/13 0103  Weight: 62.2 kg (137 lb 2 oz)    Exam:   General: Middle aged male in NAD  HEENT: no pallor, moist mucosa, enlarged thyroid   Chest: clear b/l, no added sounds   CVS: NS1&S2, no murmurs  Abd: soft, NT, ND BS+  Ext: swollen rt arm, left groin AV graft   CNS: AAOX3    Data Reviewed: Basic Metabolic Panel:  Recent Labs Lab 02/25/13 0556  NA 131*  K 3.9  CL 90*  CO2 25  GLUCOSE 281*  BUN 24*  CREATININE 5.27*  CALCIUM 8.6   Liver Function Tests: No results found for this basename: AST, ALT, ALKPHOS, BILITOT, PROT, ALBUMIN,  in the last 168 hours No results found for this basename: LIPASE,  AMYLASE,  in the last 168 hours No results found for this basename: AMMONIA,  in the last 168 hours CBC:  Recent Labs Lab 02/25/13 0556  WBC 5.1  HGB 11.0*  HCT 35.0*  MCV 98.0  PLT 256   Cardiac Enzymes: No results found for this basename: CKTOTAL, CKMB, CKMBINDEX, TROPONINI,  in the last 168 hours BNP (last 3 results) No results found for this basename: PROBNP,  in the last 8760 hours CBG:  Recent Labs Lab 02/25/13 0343 02/25/13 0803 02/25/13 1151 02/25/13 1404  GLUCAP 254* 249* 59* 128*    Recent Results (from the past 240 hour(s))  MRSA PCR SCREENING     Status: None   Collection Time    02/25/13  1:36 AM      Result Value Range Status   MRSA by PCR NEGATIVE  NEGATIVE Final   Comment:            The GeneXpert MRSA Assay (FDA     approved for NASAL specimens     only), is one component of a     comprehensive MRSA colonization     surveillance program. It is not     intended to diagnose MRSA     infection nor to guide or     monitor treatment for     MRSA  infections.     Studies: No results found.  Scheduled Meds: . amLODipine  10 mg Oral QHS  . aspirin EC  81 mg Oral Daily  . calcium acetate  1,334 mg Oral TID  . cloNIDine  0.1 mg Oral TID  . [START ON 02/26/2013] darbepoetin (ARANESP) injection - DIALYSIS  100 mcg Intravenous Q Wed-HD  . docusate sodium  100 mg Oral BID  . feeding supplement (NEPRO CARB STEADY)  237 mL Oral BID BM  . ferric gluconate (FERRLECIT/NULECIT) IV  62.5 mg Intravenous Weekly  . FLUoxetine  5 mg Oral Daily  . [START ON 02/26/2013] influenza vac split quadrivalent PF  0.5 mL Intramuscular Tomorrow-1000  . insulin aspart  0-9 Units Subcutaneous TID WC  . insulin aspart  4 Units Subcutaneous QAC breakfast  . insulin aspart  5 Units Subcutaneous Q lunch  . insulin aspart  6 Units Subcutaneous QPC supper  . insulin glargine  13 Units Subcutaneous Daily  . insulin glargine  9 Units Subcutaneous QHS  . levothyroxine  112 mcg Oral  QAC breakfast  . metoprolol  75 mg Oral BID  . pantoprazole  40 mg Oral Daily  . simvastatin  5 mg Oral q1800  . sodium chloride  3 mL Intravenous Q12H  . temazepam  15 mg Oral Custom   Continuous Infusions: . heparin 1,000 Units/hr (02/25/13 0839)      Time spent: 25 MINUTES    Marcus [name]  [facility] Pager [phone] 7PM-7AM, please contact night-coverage at [email or web address], password [facility] 02/25/2013, 2:31 PM  LOS: 0 days

## 2013-02-25 NOTE — Consult Note (Signed)
Agree with assessment.  The stent is chronically deformed and occluded, confirmed by prior study in 2013.  In setting of jugular thrombus, arm swelling likely secondary to disruption/occlusion of some collateral flow.  Would not consider catheter thrombolysis unless there is clear propagation of clot by ultrasound into the subclavian and axillary veins with corresponding worsening of edema clinically.

## 2013-02-25 NOTE — Progress Notes (Signed)
Pt admitted to the unit. Pt is alert and oriented. Pt oriented to room, staff, and call bell. Bed in lowest position. Full assessment to Epic. Call bell with in reach. Told to call for assists. Will continue to monitor.  Jamiyla Ishee E  

## 2013-02-25 NOTE — Consult Note (Addendum)
[facility] [place] HISTORY AND PHYSICAL   History of Present Illness:  Patient is a 57 y.o. year old male who presents for evaluation of right arm swelling.    He has had chronic right arm swelling but was acutely worse the last two weeks, no headaches, no airway difficulty. Pt with multiple prior access procedures.  He has end stage renal disease CKD V and has a known right innominate vein stent that has been occluded since at least Dec 13.  He has a right forearm graft and prior right upper arm procedure.  He currently is dialyzing via a left femoral graft.  Recently had a neck exploration for goiter.   The goiter is quite extensive and may have mediastinal component.  CT done at [facility] not available for review but per report RIJ thrombosis as well as collapse of right innominate stent.  Pt currently on heparin for DVT or right arm/jugular vein.  Other medical problems include hyperlipidemia, prior CVA, GERD, remote cocaine use, GI bleed from [employer] remote, hypertension, diabetes, home O2.  All of these are currently stable.  Past Medical History  Diagnosis Date  . Hypothyroidism   . Hyperlipidemia   . CVA (cerebrovascular accident)     hx CVA x 2  . GI bleed     2006, mallory-weiss tear  . Pulmonary edema     nov 2011, dry wt decreased  . Goiter     by CT Apr 2013  . Depression   . Cocaine abuse     past history  . Tobacco abuse     history of  . GERD (gastroesophageal reflux disease)   . Cataract   . Insomnia   . Chronic pain   . HTN (hypertension)     sees Dr. Ardelle Park, Rosalita Levan  . Neuromuscular disorder     diabetic neuropathy  . Occlusion of carotid stent     Per patients nursing aid  . ESRD (end stage renal disease) on dialysis     hd - [facility] - [place]  . Pneumonia     hx of  . Diabetes mellitus     Sees Dr. Reather Littler [phone] -- fasting blood 70-110s  . DKA (diabetic ketoacidosis)     severe, Dec 2011  . History of blood  transfusion   . On home oxygen therapy     2l nasal cannula at night    Past Surgical History  Procedure Laterality Date  . Av fistula placement  03/25/2008    right forearm  . Av fistula placement  04/23/2012    Procedure: INSERTION OF ARTERIOVENOUS (AV) GORE-TEX GRAFT THIGH;  Surgeon: Sherren Kerns, MD;  Location: [facility] OR;  Service: Vascular;  Laterality: Left;  . Arteriovenous graft placement Left     thigh  . Carotid stent insertion Right   . Dyalisis catheter    . Eye surgery Bilateral     cataracts  . Thyroid surgery  01/28/2013    Dr Gerrit Friends  . Lymph node biopsy Right 01/28/2013    Procedure: LYMPH NODE BIOPSY;  Surgeon: Velora Heckler, MD;  Location: [facility] OR;  Service: General;  Laterality: Right;  . Mass biopsy N/A 01/28/2013    Procedure: NECK EXPLORATION;  Surgeon: Velora Heckler, MD;  Location: [facility] OR;  Service: General;  Laterality: N/A;    Social History History  Substance Use Topics  . Smoking status: Current Every Day Smoker -- 0.25 packs/day    Types: Cigarettes  .  Smokeless tobacco: Never Used     Comment: pt states that he has been thinking about giving it up  . Alcohol Use: No    Family History No family history on file.  Allergies  Allergies  Allergen Reactions  . Penicillins      Current Facility-Administered Medications  Medication Dose Route Frequency Provider Last Rate Last Dose  . albuterol (PROVENTIL HFA;VENTOLIN HFA) 108 (90 BASE) MCG/ACT inhaler 2 puff  2 puff Inhalation Q4H PRN Hillary Bow, DO      . amLODipine (NORVASC) tablet 10 mg  10 mg Oral QHS Hillary Bow, DO      . aspirin EC tablet 81 mg  81 mg Oral Daily Hillary Bow, DO   81 mg at 02/25/13 1610  . calcium acetate (PHOSLO) tablet 2 tablet  1,334 mg Oral TID Hillary Bow, DO   2 tablet at 02/25/13 0915  . cloNIDine (CATAPRES) tablet 0.1 mg  0.1 mg Oral TID Hillary Bow, DO   0.1 mg at 02/25/13 1030  . [START ON 02/26/2013] darbepoetin (ARANESP) injection 100 mcg  100  mcg Intravenous Q Wed-HD Donald Pore, PA-C      . docusate sodium (COLACE) capsule 100 mg  100 mg Oral BID Hillary Bow, DO   100 mg at 02/25/13 1030  . feeding supplement (NEPRO CARB STEADY) liquid 237 mL  237 mL Oral BID BM Haynes Bast, RD   237 mL at 02/25/13 1030  . ferric gluconate (NULECIT) 62.5 mg in sodium chloride 0.9 % 100 mL IVPB  62.5 mg Intravenous Weekly Donald Pore, PA-C      . FLUoxetine (PROZAC) 20 MG/5ML solution 5 mg  5 mg Oral Daily Nishant Dhungel, MD   5 mg at 02/25/13 1030  . heparin ADULT infusion 100 units/mL (25000 units/250 mL)  1,000 Units/hr Intravenous Continuous Anh P Pham, RPH 10 mL/hr at 02/25/13 0839 1,000 Units/hr at 02/25/13 0839  . HYDROcodone-acetaminophen (NORCO/VICODIN) 5-325 MG per tablet 1-2 tablet  1-2 tablet Oral Q4H PRN Hillary Bow, DO      . [START ON 02/26/2013] influenza vac split quadrivalent PF (FLUARIX) injection 0.5 mL  0.5 mL Intramuscular Tomorrow-1000 Nishant Dhungel, MD      . insulin aspart (novoLOG) injection 0-9 Units  0-9 Units Subcutaneous TID WC Hillary Bow, DO   3 Units at 02/25/13 9604  . insulin glargine (LANTUS) injection 13 Units  13 Units Subcutaneous Daily Hillary Bow, DO   13 Units at 02/25/13 0915  . insulin glargine (LANTUS) injection 9 Units  9 Units Subcutaneous QHS Nishant Dhungel, MD      . levothyroxine (SYNTHROID, LEVOTHROID) tablet 112 mcg  112 mcg Oral QAC breakfast Hillary Bow, DO   112 mcg at 02/25/13 0743  . metoprolol tartrate (LOPRESSOR) tablet 75 mg  75 mg Oral BID Hillary Bow, DO   75 mg at 02/25/13 0913  . pantoprazole (PROTONIX) EC tablet 40 mg  40 mg Oral Daily Hillary Bow, DO   40 mg at 02/25/13 0913  . promethazine (PHENERGAN) tablet 25 mg  25 mg Oral Q6H PRN Hillary Bow, DO      . simvastatin (ZOCOR) tablet 5 mg  5 mg Oral q1800 Hillary Bow, DO      . sodium chloride 0.9 % injection 3 mL  3 mL Intravenous Q12H Hillary Bow, DO   3 mL at 02/25/13 0918  .  temazepam (RESTORIL)  capsule 15 mg  15 mg Oral Custom Nishant Dhungel, MD        ROS:   General:  No weight loss, Fever, chills  HEENT: No recent headaches, no nasal bleeding, no visual changes, no sore throat  Neurologic: No dizziness, blackouts, seizures. No recent symptoms of stroke or mini- stroke. No recent episodes of slurred speech, or temporary blindness.  Cardiac: No recent episodes of chest pain/pressure, no shortness of breath at rest.  + shortness of breath with exertion.  Denies history of atrial fibrillation or irregular heartbeat  Vascular: No history of rest pain in feet.  No history of claudication.  No history of non-healing ulcer, No history of DVT   Pulmonary: + home oxygen, no productive cough, no hemoptysis,  No asthma or wheezing  Musculoskeletal:  [ ]  Arthritis, [ ]  Low back pain,  [x ] Joint pain  Hematologic:No history of hypercoagulable state.  No history of easy bleeding.  No history of anemia  Gastrointestinal: No hematochezia or melena,  No gastroesophageal reflux, no trouble swallowing  Urinary: [x ] chronic Kidney disease, [ ]  on HD - [x ] MWF or [ ]  TTHS, [ ]  Burning with urination, [ ]  Frequent urination, [ ]  Difficulty urinating;   Skin: No rashes  Psychological: No history of anxiety,  No history of depression   Physical Examination  Filed Vitals:   02/25/13 0103 02/25/13 0410 02/25/13 0800  BP: 162/68 151/63 155/77  Pulse: 62 70 69  Temp: 97.1 F (36.2 C) 97.4 F (36.3 C) 98.5 F (36.9 C)  TempSrc: Oral Oral Oral  Resp: 16 16   Height: 6\' 2"  (1.88 m)    Weight: 137 lb 2 oz (62.2 kg)    SpO2: 100% 100% 100%    Body mass index is 17.6 kg/(m^2).  General:  Alert and oriented, no acute distress HEENT: Normal Neck: No JVD, anterior neck mass consistant with thyroid mass, no stridor, no muffled voice Pulmonary: Clear to auscultation bilaterally Cardiac: Regular Rate and Rhythm Abdomen: Soft, non-tender, non-distended Skin: No  rash Extremity Pulses:  Right forearm graft pulsatile with some warmth in mid upper arm compared to left, forearm and right hand edema, right hand flexion contracture chronic, right mid upper arm and forearm and hand 2 x compared to left, left thigh graft + bruit Musculoskeletal: see above  Neurologic: Upper and lower extremity motor 5/5 and symmetric   ASSESSMENT:  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  Most likely some disruption of collateral flow after recent neck operation or right IJ thrombosis.  Right forearm graft is patent which probably makes the venous hypertension worse.   Possible graft infection in the right arm but less likely and warmth is probably more related to inflammation from venous hypertension.He has a functioning left thigh graft.    PLAN:  Continue heparin followed by coumadin for 6 months.  We will plan to ligate graft or remove it if infected on his next non dialysis day which is Thursday 11/13 by my partner Dr Edilia Bo.  Procedure details risk benefit discussed.  This procedure may not totally eliminate his swelling but will hopefully improve somewhat.  Fabienne Bruns, MD [facility] [place] Office: [phone] Pager: [phone]

## 2013-02-25 NOTE — Progress Notes (Addendum)
ANTICOAGULATION CONSULT NOTE - Initial Consult  Pharmacy Consult for heparin Indication: DVT  Allergies  Allergen Reactions  . Penicillins     Patient Measurements: Height: 6\' 2"  (188 cm) Weight: 137 lb 2 oz (62.2 kg) IBW/kg (Calculated) : 82.2 Heparin Dosing Weight: 62kg  Vital Signs: Temp: 98.4 F (36.9 C) (11/11 1752) Temp src: Oral (11/11 1752) BP: 148/80 mmHg (11/11 1752) Pulse Rate: 80 (11/11 1752)  Labs:  Recent Labs  02/25/13 0556 02/25/13 1620  HGB 11.0*  --   HCT 35.0*  --   PLT 256  --   HEPARINUNFRC <0.10* <0.10*  CREATININE 5.27*  --     Estimated Creatinine Clearance: 13.6 ml/min (by C-G formula based on Cr of 5.27).   Medical History: Past Medical History  Diagnosis Date  . Hypothyroidism   . Hyperlipidemia   . CVA (cerebrovascular accident)     hx CVA x 2  . GI bleed     2006, mallory-weiss tear  . Pulmonary edema     nov 2011, dry wt decreased  . Goiter     by CT Apr 2013  . Depression   . Cocaine abuse     past history  . Tobacco abuse     history of  . GERD (gastroesophageal reflux disease)   . Cataract   . Insomnia   . Chronic pain   . HTN (hypertension)     sees Dr. Ardelle Park, Rosalita Levan  . Neuromuscular disorder     diabetic neuropathy  . Occlusion of carotid stent     Per patients nursing aid  . ESRD (end stage renal disease) on dialysis     hd - [facility] - [place]  . Pneumonia     hx of  . Diabetes mellitus     Sees Dr. Reather Littler [phone] -- fasting blood 70-110s  . DKA (diabetic ketoacidosis)     severe, Dec 2011  . History of blood transfusion   . On home oxygen therapy     2l nasal cannula at night    Assessment: Patient is a 57 y.o M on heparin for RUE DVT and R IJ thrombus.  Heparin level remains undetectable.  No issue with IV line per RN and no bleeding noted.    Goal of Therapy:  Heparin level 0.3-0.7 units/ml Monitor platelets by anticoagulation protocol: Yes   Plan:  1) heparin 2000 unit bolus, then  increase drip to 1200 units/hr 2) check 8 hour heparin level  [employer], Pharm.D., BCPS Clinical Pharmacist Pager [phone] 02/25/2013 6:59 PM   Addendum: Now to start Coumadin therapy as well.  I have asked lab to add on a PT/INR to last heparin level to establish a baseline.  Will give Coumadin 7.5mg  PO x 1 tonight.  Follow up with daily INR.  [employer], Pharm.D., BCPS Clinical Pharmacist Pager [phone] 02/25/2013 8:02 PM

## 2013-02-25 NOTE — Progress Notes (Signed)
Utilization review completed.  

## 2013-02-26 ENCOUNTER — Encounter (HOSPITAL_COMMUNITY): Payer: Self-pay | Admitting: General Practice

## 2013-02-26 DIAGNOSIS — E1065 Type 1 diabetes mellitus with hyperglycemia: Secondary | ICD-10-CM

## 2013-02-26 LAB — RENAL FUNCTION PANEL
BUN: 41 mg/dL — ABNORMAL HIGH (ref 6–23)
CO2: 24 mEq/L (ref 19–32)
Calcium: 8.6 mg/dL (ref 8.4–10.5)
Chloride: 88 mEq/L — ABNORMAL LOW (ref 96–112)
GFR calc Af Amer: 9 mL/min — ABNORMAL LOW (ref >90)
GFR calc non Af Amer: 8 mL/min — ABNORMAL LOW (ref >90)
Phosphorus: 4.1 mg/dL (ref 2.3–4.6)
Potassium: 4.7 mEq/L (ref 3.5–5.1)
Sodium: 128 mEq/L — ABNORMAL LOW (ref 135–145)

## 2013-02-26 LAB — CBC
MCH: 30.9 pg (ref 26.0–34.0)
MCHC: 31.9 g/dL (ref 30.0–36.0)
MCV: 96.7 fL (ref 78.0–100.0)
Platelets: 253 10*3/uL (ref 150–400)
RBC: 3.37 MIL/uL — ABNORMAL LOW (ref 4.22–5.81)
WBC: 6.5 10*3/uL (ref 4.0–10.5)

## 2013-02-26 LAB — GLUCOSE, CAPILLARY
Glucose-Capillary: 159 mg/dL — ABNORMAL HIGH (ref 70–99)
Glucose-Capillary: 46 mg/dL — ABNORMAL LOW (ref 70–99)

## 2013-02-26 LAB — PROTIME-INR
INR: 1.06 (ref 0.00–1.49)
Prothrombin Time: 13.6 seconds (ref 11.6–15.2)

## 2013-02-26 LAB — HEPARIN LEVEL (UNFRACTIONATED): Heparin Unfractionated: <0.1 IU/mL — ABNORMAL LOW (ref 0.30–0.70)

## 2013-02-26 MED ORDER — DARBEPOETIN ALFA-POLYSORBATE 100 MCG/0.5ML IJ SOLN
INTRAMUSCULAR | Status: AC
  Filled 2013-02-26: qty 0.5

## 2013-02-26 MED ORDER — CALCIUM ACETATE 667 MG PO CAPS
1334.0000 mg | ORAL_CAPSULE | Freq: Three times a day (TID) | ORAL | Status: DC
  Administered 2013-02-26 – 2013-03-01 (×8): 1334 mg via ORAL
  Filled 2013-02-26 (×12): qty 2

## 2013-02-26 MED ORDER — HEPARIN BOLUS VIA INFUSION
2000.0000 [IU] | Freq: Once | INTRAVENOUS | Status: AC
  Administered 2013-02-26: 2000 [IU] via INTRAVENOUS
  Filled 2013-02-26: qty 2000

## 2013-02-26 MED ORDER — BOOST / RESOURCE BREEZE PO LIQD
1.0000 | Freq: Two times a day (BID) | ORAL | Status: DC
  Administered 2013-02-27 – 2013-03-11 (×17): 1 via ORAL
  Filled 2013-02-26 (×2): qty 1

## 2013-02-26 MED ORDER — HYDROCODONE-ACETAMINOPHEN 5-325 MG PO TABS
ORAL_TABLET | ORAL | Status: AC
  Filled 2013-02-26: qty 1

## 2013-02-26 MED ORDER — HEPARIN (PORCINE) IN NACL 100-0.45 UNIT/ML-% IJ SOLN
1450.0000 [IU]/h | INTRAMUSCULAR | Status: DC
  Filled 2013-02-26 (×2): qty 250

## 2013-02-26 MED ORDER — HEPARIN (PORCINE) IN NACL 100-0.45 UNIT/ML-% IJ SOLN
1800.0000 [IU]/h | INTRAMUSCULAR | Status: DC
  Administered 2013-02-26 – 2013-02-27 (×2): 1650 [IU]/h via INTRAVENOUS
  Administered 2013-02-27: 1700 [IU]/h via INTRAVENOUS
  Administered 2013-02-27: 1650 [IU]/h via INTRAVENOUS
  Administered 2013-02-28: 1700 [IU]/h via INTRAVENOUS
  Administered 2013-03-01 – 2013-03-02 (×4): 1800 [IU]/h via INTRAVENOUS
  Filled 2013-02-26 (×5): qty 250

## 2013-02-26 NOTE — Progress Notes (Signed)
ANTICOAGULATION CONSULT NOTE- HEPARIN  INDICATION: DVT  Heparin level = 0.17 on 1450 units/hr Goal heparin level = 0.3-0.7  The heparin level remains suptherapeutic. Will give a 2000 units bolus and increase the rate to 1650 units/hr. Pt for surgical procedure tomorrow.  Cardell Peach, PharmD

## 2013-02-26 NOTE — Procedures (Signed)
I was present at this dialysis session. I have reviewed the session itself and made appropriate changes.   Sabra Heck  MD 02/26/2013, 2:04 PM

## 2013-02-26 NOTE — Progress Notes (Signed)
I saw the patient and agree with the above assessment and plan.    

## 2013-02-26 NOTE — Progress Notes (Signed)
ANTICOAGULATION CONSULT NOTE - Follow Up Consult  Pharmacy Consult: Heparin Indication:  DVT  Allergies  Allergen Reactions  . Penicillins     Patient Measurements: Height: 6\' 2"  (188 cm) Weight: 137 lb 2 oz (62.199 kg) IBW/kg (Calculated) : 82.2 Heparin Dosing Weight: 62 kg  Vital Signs: Temp: 97.8 F (36.6 C) (11/12 0433) Temp src: Oral (11/12 0433) BP: 132/59 mmHg (11/12 0433) Pulse Rate: 58 (11/12 0433)  Labs:  Recent Labs  02/25/13 0556 02/25/13 1620 02/26/13 0400 02/26/13 0602  HGB 11.0*  --  10.4*  --   HCT 35.0*  --  32.6*  --   PLT 256  --  253  --   LABPROT  --  12.6 13.6  --   INR  --  0.96 1.06  --   HEPARINUNFRC <0.10* <0.10*  --  <0.10*  CREATININE 5.27*  --   --   --     Estimated Creatinine Clearance: 13.6 ml/min (by C-G formula based on Cr of 5.27).      Assessment: 65 YOM with new RUE DVT and right IJ thrombus to continue on IV heparin.  Hold Coumadin today per MD due to plan for ligation of graft and possible removal on 02/17/13.  Heparin level remains undetectable.  RN reported no complications with heparin infusion.  No bleeding reported.   Goal of Therapy:  INR 2-3 Heparin level 0.3-0.7 units/ml Monitor platelets by anticoagulation protocol: Yes    Plan:  - Heparin 2000 units IV bolus, then - Increase heparin gtt to 1450 units/hr - Check 8 hr HL - Hold Coumadin today per MD instruction - Daily HL / CBC / PT / INR - Watch hgb while on Aranesp, vitals - F/U CBG and insulin adjustment    Tesla Bochicchio D. Laney Potash, PharmD, BCPS Pager:  [phone] 02/26/2013, 9:27 AM

## 2013-02-26 NOTE — Progress Notes (Signed)
Subjective:  Some improving arm pain Objective Vital signs in last 24 hours: Filed Vitals:   02/25/13 1400 02/25/13 1752 02/25/13 2048 02/26/13 0433  BP: 154/85 148/80 151/59 132/59  Pulse: 72 80 65 58  Temp: 98 F (36.7 C) 98.4 F (36.9 C) 98.8 F (37.1 C) 97.8 F (36.6 C)  TempSrc: Oral Oral Oral Oral  Resp:  20 20 20   Height:   6\' 2"  (1.88 m)   Weight:   62.199 kg (137 lb 2 oz)   SpO2: 100% 100% 100% 99%   Weight change: -0.001 kg (-0 oz)  Intake/Output Summary (Last 24 hours) at 02/26/13 0824 Last data filed at 02/26/13 0436  Gross per 24 hour  Intake 1271.7 ml  Output      0 ml  Net 1271.7 ml   Labs: Basic Metabolic Panel:  Recent Labs Lab 02/25/13 0556  NA 131*  K 3.9  CL 90*  CO2 25  GLUCOSE 281*  BUN 24*  CREATININE 5.27*  CALCIUM 8.6   Liver Function Tests: No results found for this basename: AST, ALT, ALKPHOS, BILITOT, PROT, ALBUMIN,  in the last 168 hours No results found for this basename: LIPASE, AMYLASE,  in the last 168 hours No results found for this basename: AMMONIA,  in the last 168 hours CBC:  Recent Labs Lab 02/25/13 0556 02/26/13 0400  WBC 5.1 6.5  HGB 11.0* 10.4*  HCT 35.0* 32.6*  MCV 98.0 96.7  PLT 256 253   Cardiac Enzymes: No results found for this basename: CKTOTAL, CKMB, CKMBINDEX, TROPONINI,  in the last 168 hours CBG:  Recent Labs Lab 02/25/13 0803 02/25/13 1151 02/25/13 1404 02/25/13 1721 02/25/13 2050  GLUCAP 249* 59* 128* 147* 173*    Iron Studies: No results found for this basename: IRON, TIBC, TRANSFERRIN, FERRITIN,  in the last 72 hours Studies/Results: No results found. Medications: . heparin 1,200 Units/hr (02/25/13 2323)    Physical Exam: General: Thin BM  in NAD  Heart: RRR . No rub or mur  Lungs: CTA  Abdomen: BS Pos. Soft ,nontender  Extremities: No pedal edema/ R Upper extremity Edema persists  Dialysis Access: Pos bruit L FEM AVGG/ R Fore Arm  AVGG pos bruit and extensive swelling of  area.   Dialysis Orders: Center: [facility] on mwf .  EDW 61 kg HD Bath 2.0 k Time 2.25 Heparin 2400. Access  l fem AVGG BFR 450 DFR af 1.5  Hectorol 0 (pth 56) mcg IV/HD Epogen 6800 Units IV/HD Venofer 50mg  per week   Assessment/Plan  1. R IJ DVT/ andThrombus Axillary and Brachiocephalic Veins= IV heparin IR seeing/ Noted VVS plans Ligation of R FA AVGG in am 2. ESRD - HD MWF ( [facility]) on schedule  For today 3. Hypertension/volume - 132/59 on Amlodipine 10mg  hs, Clonidine.1mg  tid Hold am hd, lopressor 50mg  hs( not sure takes appropriately at home) monitor for BP drops with HD / may need to taper down 4. Anemia - HGB 11.0 >10.4 continue epo/ venofer on  5. Metabolic bone disease - No hectorol / Renvela binder and calcium acetate listed/ last phos 2.8on 10/22= dc renvela  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 Nutrition - renal . Carb Mod / renavite 8. Type 1 dm - per admit   Lenny Pastel, PA-C [facility] Beeper [phone] 02/26/2013,8:24 AM  LOS: 1 day

## 2013-02-26 NOTE — Progress Notes (Signed)
SPEECH PATHOLOGY  Attempted to see pt. For swallow evaluation.  Pt. Currently in dialysis.  Will return 02/27/13.  Maryjo Rochester, CCC-SLP

## 2013-02-26 NOTE — Progress Notes (Signed)
NUTRITION FOLLOW-UP/CONSULT  DOCUMENTATION CODES Per approved criteria  -Severe malnutrition in the context of chronic illness   INTERVENTION: Diet texture and liquid consistency per SLP. Change [employer] to [employer]. Encouraged meal intake. RD to continue to follow nutrition care plan.  NUTRITION DIAGNOSIS: Increased nutrient needs related to HD as evidenced by estimated needs.  Ongoing.  Goal: Intake to meet >90% of estimated nutrition needs.  Monitor:  weight trends, lab trends, I/O's, PO intake, supplement tolerance  ASSESSMENT: PMHx significant for ESRD (HD on MWF), CVA x 2, hypothyroidism, cocaine abuse. Admitted with worsening R arm swelling x 1 week. Work-up reveals RUE DVT and thrombus in R IJ. Pt is s/p recent thyroidectomy attempt.  RD consulted for Nutrition Assessment - please see full assessment completed yesterday, 11/11.  Radiology evaluated pt on 11/11 and did feel as though a thrombectomy or lysis is appropriate at this time. Vascular evaluated pt on 11/11 and plan to ligate or remove graft tomorrow.  Low blood sugar on 11/11, 11/12. Pt is now complaining of dysphagia, SLP consult pending at this time. Pt is currently on a Dysphagia 3 diet with thin liquids - renal restrictions have been removed. Potassium is WNL, no phosphorus is available. Also currently ordered for Nepro Shake PO BID. RD discussed oral intake with patient. He states that for breakfast, he ate all of his eggs and "some" of his potatoes. When asked why he didn't eat all of his potatoes, he stated "I don't want all of those starchy foods." Encouraged adequate intake of carbohydrates for blood sugar control. Discussed with RN.  Pt meets criteria for severe MALNUTRITION in the context of chronic illness as evidenced by severe muscle depletion and suspected intake of <75% x at least 1 month.   Height: Ht Readings from Last 1 Encounters:  02/25/13 6\' 2"  (1.88 m)    Weight: Wt Readings from Last  1 Encounters:  02/25/13 137 lb 2 oz (62.199 kg)  Admit wt 137 lb - stable  BMI:  Body mass index is 17.6 kg/(m^2). Underweight  Estimated Nutritional Needs: Kcal: 1800 - 2000 Protein: 75 - 90 g  Fluid: 1.2 liters  Skin: L foot skin tear  Diet Order: Dysphagia 3; thin  EDUCATION NEEDS: -Education not appropriate at this time   Intake/Output Summary (Last 24 hours) at 02/26/13 1303 Last data filed at 02/26/13 0950  Gross per 24 hour  Intake 1631.7 ml  Output      0 ml  Net 1631.7 ml    Last BM: 11/10  Labs:   Recent Labs Lab 02/25/13 0556  NA 131*  K 3.9  CL 90*  CO2 25  BUN 24*  CREATININE 5.27*  CALCIUM 8.6  GLUCOSE 281*    CBG (last 3)   Recent Labs  02/26/13 0750 02/26/13 0824 02/26/13 1141  GLUCAP 52* 147* 159*    Scheduled Meds: . amLODipine  10 mg Oral QHS  . aspirin EC  81 mg Oral Daily  . calcium acetate  1,334 mg Oral TID WC  . cloNIDine  0.1 mg Oral TID  . darbepoetin (ARANESP) injection - DIALYSIS  100 mcg Intravenous Q Wed-HD  . docusate sodium  100 mg Oral BID  . feeding supplement (NEPRO CARB STEADY)  237 mL Oral BID BM  . ferric gluconate (FERRLECIT/NULECIT) IV  62.5 mg Intravenous Weekly  . FLUoxetine  5 mg Oral Daily  . influenza vac split quadrivalent PF  0.5 mL Intramuscular Tomorrow-1000  . insulin aspart  0-9  Units Subcutaneous TID WC  . levothyroxine  112 mcg Oral QAC breakfast  . metoprolol  75 mg Oral BID  . pantoprazole  40 mg Oral Daily  . simvastatin  5 mg Oral q1800  . sodium chloride  3 mL Intravenous Q12H  . temazepam  15 mg Oral Custom  . [START ON 02/27/2013] vancomycin  1,000 mg Other To OR  . Warfarin - Pharmacist Dosing Inpatient   Does not apply q1800    Continuous Infusions: . heparin 1,450 Units/hr (02/26/13 0945)    Jarold Motto MS, RD, LDN Pager: [phone] After-hours pager: [phone]

## 2013-02-26 NOTE — Progress Notes (Signed)
[facility] PROGRESS NOTE  Marcus [name] ZOX:[id number] DOB: [birth year] DOA: 02/25/2013 PCP: Galvin Proffer, MD  Brief narrative 57 y.o. male with h/o ESRD (HD MWF) who presents to [facility] with worsening R arm swelling over the past week. His current HD access is in his L thigh and unrelated to this. Patient had a recent  attempt at thyroidectomy performed last month by Dr. Gerrit Friends. 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  In the ED at [facility] he was found to have not only RUE DVT but thrombus in his R IJ as well. The enlargement of the thyroid appears to be extending down into his thoracic cavity (unchanged since prior CT). 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  [facility] thus [facility].    Assessment/Plan:  Rt IJ thrombus  placed on heparin drip. Possible from direct compression of IJ by enlarged thyroid. Appreciate gen surgery andf IR evaluation. Vascular sx consulted. Recommend IV heparin and start on coumadin. Plan on ligating the rt AV graft or removing it tomorrow.  CT reports unchanged airway compression. patient in no resp distress. does c/o some dysphagia which is new. Will get swallow eval.   Thyroid mass  Thyroiditis per recent bx   ESRD on HD ( M,W,F)  renal following .has left thigh graft   Type 1 DM  Noted for low fsg in the morning. Will hold lantus and premeal aspart. Monitor on SSI only. Patient has poor po intake. Nutrition consulted. Marland Kitchen   HTN  continue home meds   Hx of CVA  continue ASA, statin   GERD  continue PPI   DIET: dys level 3   DVT prophylaxis: IV heparin , hold coumadin until procedure tomorrow.  Code Status: full code  Family Communication: none  Disposition Plan: pending    Consultants:  [facility]  vascular  surgery ( Dr Darrick Penna) consulted  IR   Procedures:  For right AV graft ligation in am  Antibiotics:  None  HPI/Subjective:  deneis any symptoms . Noted to have low fsg this am    Objective: Filed Vitals:   02/26/13 0834  BP: 142/61  Pulse: 83  Temp: 97 F (36.1 C)  Resp: 20    Intake/Output Summary (Last 24 hours) at 02/26/13 1218 Last data filed at 02/26/13 0950  Gross per 24 hour  Intake 1631.7 ml  Output      0 ml  Net 1631.7 ml   Filed Weights   02/25/13 0103 02/25/13 2048  Weight: 62.2 kg (137 lb 2 oz) 62.199 kg (137 lb 2 oz)    Exam: General: Middle aged male in NAD  HEENT: no pallor, moist mucosa, enlarged thyroid  Chest: clear b/l, no added sounds  CVS: NS1&S2, no murmurs  Abd: soft, NT, ND BS+ Ext: swollen rt arm and forearm , left groin AV graft  CNS: AAOX3  Data Reviewed: Basic Metabolic Panel:  Recent Labs Lab 02/25/13 0556  NA 131*  K 3.9  CL 90*  CO2 25  GLUCOSE 281*  BUN 24*  CREATININE 5.27*  CALCIUM 8.6   Liver Function Tests: No results found for this basename: AST, ALT, ALKPHOS, BILITOT, PROT, ALBUMIN,  in the last 168 hours No results found for this basename: LIPASE, AMYLASE,  in  the last 168 hours No results found for this basename: AMMONIA,  in the last 168 hours CBC:  Recent Labs Lab 02/25/13 0556 02/26/13 0400  WBC 5.1 6.5  HGB 11.0* 10.4*  HCT 35.0* 32.6*  MCV 98.0 96.7  PLT 256 253   Cardiac Enzymes: No results found for this basename: CKTOTAL, CKMB, CKMBINDEX, TROPONINI,  in the last 168 hours BNP (last 3 results) No results found for this basename: PROBNP,  in the last 8760 hours CBG:  Recent Labs Lab 02/25/13 1721 02/25/13 2050 02/26/13 0750 02/26/13 0824 02/26/13 1141  GLUCAP 147* 173* 52* 147* 159*    Recent Results (from the past 240 hour(s))  MRSA PCR SCREENING     Status: None   Collection Time    02/25/13  1:36 AM      Result Value Range Status   MRSA by PCR NEGATIVE  NEGATIVE Final    Comment:            The GeneXpert MRSA Assay (FDA     approved for NASAL specimens     only), is one component of a     comprehensive MRSA colonization     surveillance program. It is not     intended to diagnose MRSA     infection nor to guide or     monitor treatment for     MRSA infections.     Studies: No results found.  Scheduled Meds: . amLODipine  10 mg Oral QHS  . aspirin EC  81 mg Oral Daily  . calcium acetate  1,334 mg Oral TID WC  . cloNIDine  0.1 mg Oral TID  . darbepoetin (ARANESP) injection - DIALYSIS  100 mcg Intravenous Q Wed-HD  . docusate sodium  100 mg Oral BID  . feeding supplement (NEPRO CARB STEADY)  237 mL Oral BID BM  . ferric gluconate (FERRLECIT/NULECIT) IV  62.5 mg Intravenous Weekly  . FLUoxetine  5 mg Oral Daily  . influenza vac split quadrivalent PF  0.5 mL Intramuscular Tomorrow-1000  . insulin aspart  0-9 Units Subcutaneous TID WC  . levothyroxine  112 mcg Oral QAC breakfast  . metoprolol  75 mg Oral BID  . pantoprazole  40 mg Oral Daily  . simvastatin  5 mg Oral q1800  . sodium chloride  3 mL Intravenous Q12H  . temazepam  15 mg Oral Custom  . [START ON 02/27/2013] vancomycin  1,000 mg Other To OR  . Warfarin - Pharmacist Dosing Inpatient   Does not apply q1800   Continuous Infusions: . heparin 1,450 Units/hr (02/26/13 0945)      Time spent: 25 minutes    Marcus [name]  [facility] Pager [phone] If 7PM-7AM, please contact night-coverage at [email or web address], password [facility] 02/26/2013, 12:18 PM  LOS: 1 day

## 2013-02-26 NOTE — Progress Notes (Signed)
Hypoglycemic Event  CBG: 52  Treatment: 15 GM carbohydrate snack  Symptoms: Hungry  Follow-up CBG: Time: 8:24 CBG Result: 147  Possible Reasons for Event: Medication regimen: Too much Lantus at bedtime  Comments/MD notified: MD Dhungel notified    Marcus [name] L  Remember to initiate Hypoglycemia Order Set & complete

## 2013-02-26 NOTE — Progress Notes (Signed)
Inpatient Diabetes Program Recommendations  AACE/ADA: New Consensus Statement on Inpatient Glycemic Control (2013)  Target Ranges:  Prepandial:   less than 140 mg/dL      Peak postprandial:   less than 180 mg/dL (1-2 hours)      Critically ill patients:  140 - 180 mg/dL     Results for JAMIESON, HETLAND (MRN [id number]) as of 02/26/2013 09:45  Ref. Range 02/25/2013 08:03 02/25/2013 11:51 02/25/2013 14:04 02/25/2013 17:21 02/25/2013 20:50  Glucose-Capillary Latest Range: 70-99 mg/dL 086 (H) 59 (L) 578 (H) 147 (H) 173 (H)    Results for JAQUARIUS, SEDER (MRN [id number]) as of 02/26/2013 09:45  Ref. Range 02/26/2013 07:50 02/26/2013 08:24  Glucose-Capillary Latest Range: 70-99 mg/dL 52 (L) 413 (H)    **Patient with hypoglycemia yesterday at lunch.  Novolog meal coverage d/c'd yesterday as a result.  **Noted patient hypoglycemic this morning (CBG 52 mg/dl).  MD- Please consider discontinuing Lantus 9 units QHS (but continue Lantus 13 units daily for now)   Will follow. Ambrose Finland RN, MSN, CDE Diabetes Coordinator Inpatient Diabetes Program Team Pager: [phone] (8a-10p)

## 2013-02-27 ENCOUNTER — Encounter (HOSPITAL_COMMUNITY): Payer: Self-pay | Admitting: Anesthesiology

## 2013-02-27 ENCOUNTER — Inpatient Hospital Stay (HOSPITAL_COMMUNITY): Payer: Medicare Other | Admitting: Anesthesiology

## 2013-02-27 ENCOUNTER — Encounter (HOSPITAL_COMMUNITY)
Admission: AD | Disposition: A | Discharge status: Home / Self Care | Payer: Self-pay | Source: Other Acute Inpatient Hospital | Attending: Internal Medicine

## 2013-02-27 ENCOUNTER — Encounter (HOSPITAL_COMMUNITY): Payer: Medicare Other | Admitting: Anesthesiology

## 2013-02-27 DIAGNOSIS — E43 Unspecified severe protein-calorie malnutrition: Secondary | ICD-10-CM | POA: Diagnosis present

## 2013-02-27 HISTORY — PX: LIGATION ARTERIOVENOUS GORTEX GRAFT: SHX5947

## 2013-02-27 LAB — BASIC METABOLIC PANEL
BUN: 18 mg/dL (ref 6–23)
CO2: 26 mEq/L (ref 19–32)
Calcium: 8.6 mg/dL (ref 8.4–10.5)
GFR calc Af Amer: 17 mL/min — ABNORMAL LOW (ref >90)
GFR calc non Af Amer: 15 mL/min — ABNORMAL LOW (ref >90)
Potassium: 3.5 mEq/L (ref 3.5–5.1)
Sodium: 132 mEq/L — ABNORMAL LOW (ref 135–145)

## 2013-02-27 LAB — GLUCOSE, CAPILLARY
Glucose-Capillary: 121 mg/dL — ABNORMAL HIGH (ref 70–99)
Glucose-Capillary: 64 mg/dL — ABNORMAL LOW (ref 70–99)
Glucose-Capillary: 92 mg/dL (ref 70–99)
Glucose-Capillary: 101 mg/dL — ABNORMAL HIGH (ref 70–99)
Glucose-Capillary: 270 mg/dL — ABNORMAL HIGH (ref 70–99)
Glucose-Capillary: 324 mg/dL — ABNORMAL HIGH (ref 70–99)

## 2013-02-27 LAB — CBC
Hemoglobin: 11.2 g/dL — ABNORMAL LOW (ref 13.0–17.0)
MCHC: 31.7 g/dL (ref 30.0–36.0)
MCV: 97 fL (ref 78.0–100.0)
RDW: 15.4 % (ref 11.5–15.5)
WBC: 7.2 10*3/uL (ref 4.0–10.5)

## 2013-02-27 LAB — PROTIME-INR
INR: 1.29 (ref 0.00–1.49)
Prothrombin Time: 15.8 seconds — ABNORMAL HIGH (ref 11.6–15.2)

## 2013-02-27 LAB — HEPARIN LEVEL (UNFRACTIONATED): Heparin Unfractionated: <0.1 IU/mL — ABNORMAL LOW (ref 0.30–0.70)

## 2013-02-27 SURGERY — LIGATION ARTERIOVENOUS GORTEX GRAFT
Anesthesia: Monitor Anesthesia Care | Site: Arm Upper | Laterality: Right | Wound class: Clean

## 2013-02-27 MED ORDER — 0.9 % SODIUM CHLORIDE (POUR BTL) OPTIME
TOPICAL | Status: DC | PRN
  Administered 2013-02-27: 1000 mL

## 2013-02-27 MED ORDER — LIDOCAINE-EPINEPHRINE (PF) 1 %-1:200000 IJ SOLN
INTRAMUSCULAR | Status: DC | PRN
  Administered 2013-02-27: 30 mL

## 2013-02-27 MED ORDER — VANCOMYCIN HCL IN DEXTROSE 1-5 GM/200ML-% IV SOLN
INTRAVENOUS | Status: AC
  Filled 2013-02-27: qty 200

## 2013-02-27 MED ORDER — LIDOCAINE HCL (PF) 1 % IJ SOLN
INTRAMUSCULAR | Status: AC
  Filled 2013-02-27: qty 30

## 2013-02-27 MED ORDER — WARFARIN SODIUM 7.5 MG PO TABS
7.5000 mg | ORAL_TABLET | Freq: Once | ORAL | Status: AC
  Administered 2013-02-27: 7.5 mg via ORAL
  Filled 2013-02-27: qty 1

## 2013-02-27 MED ORDER — LIDOCAINE-EPINEPHRINE (PF) 1 %-1:200000 IJ SOLN
INTRAMUSCULAR | Status: AC
  Filled 2013-02-27: qty 10

## 2013-02-27 MED ORDER — OXYCODONE-ACETAMINOPHEN 5-325 MG PO TABS
1.0000 | ORAL_TABLET | ORAL | Status: DC | PRN
  Administered 2013-02-28 – 2013-03-01 (×2): 2 via ORAL
  Administered 2013-03-02: 1 via ORAL
  Administered 2013-03-02: 2 via ORAL
  Administered 2013-03-05 – 2013-03-06 (×2): 1 via ORAL
  Administered 2013-03-06 – 2013-03-08 (×5): 2 via ORAL
  Filled 2013-02-27 (×3): qty 2
  Filled 2013-02-27: qty 1
  Filled 2013-02-27: qty 2
  Filled 2013-02-27: qty 1
  Filled 2013-02-27 (×5): qty 2
  Filled 2013-02-27: qty 1

## 2013-02-27 MED ORDER — THROMBIN 20000 UNITS EX SOLR
CUTANEOUS | Status: AC
  Filled 2013-02-27: qty 20000

## 2013-02-27 MED ORDER — FENTANYL CITRATE 0.05 MG/ML IJ SOLN
INTRAMUSCULAR | Status: DC | PRN
  Administered 2013-02-27: 50 ug via INTRAVENOUS

## 2013-02-27 MED ORDER — MIDAZOLAM HCL 5 MG/5ML IJ SOLN
INTRAMUSCULAR | Status: DC | PRN
  Administered 2013-02-27: .5 mg via INTRAVENOUS

## 2013-02-27 MED ORDER — SODIUM CHLORIDE 0.9 % IV SOLN
INTRAVENOUS | Status: DC | PRN
  Administered 2013-02-27: 10:00:00 via INTRAVENOUS

## 2013-02-27 MED ORDER — DEXTROSE 50 % IV SOLN
INTRAVENOUS | Status: AC
  Administered 2013-02-27: 50 mL
  Filled 2013-02-27: qty 50

## 2013-02-27 MED ORDER — DEXMEDETOMIDINE HCL IN NACL 200 MCG/50ML IV SOLN
INTRAVENOUS | Status: DC | PRN
  Administered 2013-02-27: 0.5 ug/kg/h via INTRAVENOUS

## 2013-02-27 MED ORDER — DEXTROSE 50 % IV SOLN
25.0000 mL | Freq: Once | INTRAVENOUS | Status: AC
  Administered 2013-02-27: 25 mL via INTRAVENOUS

## 2013-02-27 MED ORDER — LACTATED RINGERS IV SOLN: INTRAVENOUS | Status: DC

## 2013-02-27 MED ORDER — SODIUM CHLORIDE 0.9 % IV SOLN
INTRAVENOUS | Status: DC
  Administered 2013-02-27 – 2013-03-02 (×3): via INTRAVENOUS

## 2013-02-27 MED ORDER — DEXTROSE 50 % IV SOLN
INTRAVENOUS | Status: AC
  Administered 2013-02-27: 25 mL via INTRAVENOUS
  Filled 2013-02-27: qty 50

## 2013-02-27 SURGICAL SUPPLY — 38 items
ADH SKN CLS APL DERMABOND .7 (GAUZE/BANDAGES/DRESSINGS) ×1
CANISTER SUCTION 2500CC (MISCELLANEOUS) ×2 IMPLANT
COVER SURGICAL LIGHT HANDLE (MISCELLANEOUS) ×2 IMPLANT
DERMABOND ADVANCED (GAUZE/BANDAGES/DRESSINGS) ×1
DERMABOND ADVANCED .7 DNX12 (GAUZE/BANDAGES/DRESSINGS) ×1 IMPLANT
ELECT REM PT RETURN 9FT ADLT (ELECTROSURGICAL) ×2
ELECTRODE REM PT RTRN 9FT ADLT (ELECTROSURGICAL) ×1 IMPLANT
GEL ULTRASOUND 20GR AQUASONIC (MISCELLANEOUS) IMPLANT
GLOVE BIO SURGEON STRL SZ7.5 (GLOVE) ×2 IMPLANT
GLOVE BIOGEL PI IND STRL 7.0 (GLOVE) IMPLANT
GLOVE BIOGEL PI IND STRL 7.5 (GLOVE) IMPLANT
GLOVE BIOGEL PI IND STRL 8 (GLOVE) ×1 IMPLANT
GLOVE BIOGEL PI INDICATOR 7.0 (GLOVE) ×1
GLOVE BIOGEL PI INDICATOR 7.5 (GLOVE) ×1
GLOVE BIOGEL PI INDICATOR 8 (GLOVE) ×1
GLOVE SS BIOGEL STRL SZ 7 (GLOVE) IMPLANT
GLOVE SUPERSENSE BIOGEL SZ 7 (GLOVE) ×1
GOWN STRL NON-REIN LRG LVL3 (GOWN DISPOSABLE) ×6 IMPLANT
GOWN STRL REIN XL XLG (GOWN DISPOSABLE) ×1 IMPLANT
KIT BASIN OR (CUSTOM PROCEDURE TRAY) ×2 IMPLANT
KIT ROOM TURNOVER OR (KITS) ×2 IMPLANT
NS IRRIG 1000ML POUR BTL (IV SOLUTION) ×2 IMPLANT
PACK CV ACCESS (CUSTOM PROCEDURE TRAY) ×2 IMPLANT
PAD ARMBOARD 7.5X6 YLW CONV (MISCELLANEOUS) ×4 IMPLANT
SPONGE GAUZE 4X4 12PLY (GAUZE/BANDAGES/DRESSINGS) ×2 IMPLANT
SPONGE SURGIFOAM ABS GEL 100 (HEMOSTASIS) IMPLANT
SUT ETHILON 3 0 PS 1 (SUTURE) IMPLANT
SUT PROLENE 6 0 BV (SUTURE) IMPLANT
SUT SILK 0 TIES 10X30 (SUTURE) ×2 IMPLANT
SUT VIC AB 3-0 SH 27 (SUTURE) ×2
SUT VIC AB 3-0 SH 27X BRD (SUTURE) ×1 IMPLANT
SUT VICRYL 4-0 PS2 18IN ABS (SUTURE) ×2 IMPLANT
SWAB COLLECTION DEVICE MRSA (MISCELLANEOUS) IMPLANT
TOWEL OR 17X24 6PK STRL BLUE (TOWEL DISPOSABLE) ×2 IMPLANT
TOWEL OR 17X26 10 PK STRL BLUE (TOWEL DISPOSABLE) ×2 IMPLANT
TUBE ANAEROBIC SPECIMEN COL (MISCELLANEOUS) IMPLANT
UNDERPAD 30X30 INCONTINENT (UNDERPADS AND DIAPERS) ×2 IMPLANT
WATER STERILE IRR 1000ML POUR (IV SOLUTION) ×2 IMPLANT

## 2013-02-27 NOTE — Op Note (Signed)
NAME: Marcus [name]   MRN: [id number] DOB: 1956/04/17    DATE OF OPERATION: 02/27/2013  PREOP DIAGNOSIS: venous hypertension right upper extremity  POSTOP DIAGNOSIS: same  PROCEDURE: ligation of right forearm AV graft  SURGEON: Di Kindle. Edilia Bo, MD, FACS  ASSIST: none  ANESTHESIA: local with sedation   EBL: minimal  INDICATIONS: [name] is a 57 y.o. male with a right forearm AV graft. He has a known right brachiocephalic vein occlusion. He had significant arm swelling I was asked to ligate his graft. He  FINDINGS: the graft was patent.  TECHNIQUE: The patient was taken to the operating room and sedated by anesthesia. The right upper extremity was prepped and draped in usual sterile fashion. After the skin was anesthetized with 1% lidocaine, a small transverse incision was made over the graft below the antecubital level along the lateral aspect of the forearm. The graft was dissected free. It was ligated with an 0 silk tie. Hemostasis was obtained in the wound. The wounds closed the deep layer of 3-0 Vicryl and the skin closed with 4-0 Vicryl. Dermabond was applied. The patient tolerated the procedure well and was transferred to the recovery room in stable condition. All needle and sponge counts were correct.  Waverly Ferrari, MD, FACS [facility]  DATE OF DICTATION:   02/27/2013

## 2013-02-27 NOTE — Interval H&P Note (Signed)
History and Physical Interval Note:  02/27/2013 9:42 AM  Marcus [name]  has presented today for surgery, with the diagnosis of ARM SWELLING,ESRD  The various methods of treatment have been discussed with the patient and family. After consideration of risks, benefits and other options for treatment, the patient has consented to  Procedure(s): LIGATION ARTERIOVENOUS GORTEX GRAFT (Right) as a surgical intervention .  The patient's history has been reviewed, patient examined, no change in status, stable for surgery.  I have reviewed the patient's chart and labs.  Questions were answered to the patient's satisfaction.     Devone Tousley S

## 2013-02-27 NOTE — Transfer of Care (Signed)
Immediate Anesthesia Transfer of Care Note  Patient: Marcus [name]  Procedure(s) Performed: Procedure(s): LIGATION ARTERIOVENOUS GORTEX GRAFT (Right)  Patient Location: PACU  Anesthesia Type:MAC  Level of Consciousness: awake, alert  and oriented  Airway & Oxygen Therapy: Patient Spontanous Breathing and Patient connected to nasal cannula oxygen  Post-op Assessment: Report given to PACU RN, Post -op Vital signs reviewed and stable and Patient moving all extremities X 4  Post vital signs: Reviewed and stable  Complications: No apparent anesthesia complications

## 2013-02-27 NOTE — Progress Notes (Signed)
I have seen and examined this patient and agree with the plan of care status post ligation of Right AVG. Central vein stenosis September Mormile W 02/27/2013, 3:13 PM

## 2013-02-27 NOTE — Progress Notes (Addendum)
ANTICOAGULATION CONSULT NOTE - Follow Up Consult  Pharmacy Consult: Heparin + Coumadin (VTE overlap Day #1/5) Indication:  DVT  Allergies  Allergen Reactions  . Penicillins Other (See Comments)    Unknown reaction    Patient Measurements: Height: 6\' 2"  (188 cm) Weight: 143 lb 1.3 oz (64.901 kg) IBW/kg (Calculated) : 82.2 Heparin Dosing Weight: 62 kg  Vital Signs: Temp: 97.7 F (36.5 C) (11/13 1143) Temp src: Oral (11/13 0410) BP: 146/72 mmHg (11/13 1143) Pulse Rate: 55 (11/13 1143)  Labs:  Recent Labs  02/25/13 0556 02/25/13 1620 02/26/13 0400  02/26/13 1326 02/26/13 1955 02/27/13 0500 02/27/13 1340  HGB 11.0*  --  10.4*  --   --   --  11.2*  --   HCT 35.0*  --  32.6*  --   --   --  35.3*  --   PLT 256  --  253  --   --   --  255  --   LABPROT  --  12.6 13.6  --   --   --  15.8*  --   INR  --  0.96 1.06  --   --   --  1.29  --   HEPARINUNFRC <0.10* <0.10*  --   < >  --  0.17* 0.35 <0.10*  CREATININE 5.27*  --   --   --  7.10*  --  4.10*  --   < > = values in this interval not displayed.  Estimated Creatinine Clearance: 18.2 ml/min (by C-G formula based on Cr of 4.1).   Assessment: 87 YOM with new RUE DVT and right IJ thrombus to continue on IV heparin.  Heparin running at 1650 units/hr. Was stopped this AM for ligation of AV graft and resumed approximately 1 hour prior to heparin lab draw. Likely as a result, HL is undetectable. RN reported no other complications with heparin infusion.  INR subtherapeutic at 1.29. No bleeding reported.   Goal of Therapy:  INR 2-3 Heparin level 0.3-0.7 units/ml Monitor platelets by anticoagulation protocol: Yes    Plan:  - Increase heparin gtt to 1700 units/hr - Check 8 hr HL - Coumadin 7.5 mg PO today - Daily HL / CBC / PT / INR - Watch hgb while on Aranesp   Vinnie Level, PharmD.  [place] License #[id number] Application for [place] reciprocity pending  Clinical Pharmacist Pager [phone]   Jaana Brodt D. Laney Potash, PharmD,  BCPS Pager:  [phone] 02/27/2013, 4:02 PM

## 2013-02-27 NOTE — Preoperative (Signed)
Beta Blockers   Reason not to administer Beta Blockers:Not Applicable 

## 2013-02-27 NOTE — Anesthesia Preprocedure Evaluation (Addendum)
Anesthesia Evaluation  Patient identified by MRN, date of birth, ID band Patient awake    Airway Mallampati: I TM Distance: >3 FB Neck ROM: full    Dental  (+) Edentulous Upper, Edentulous Lower and Dental Advidsory Given   Pulmonary Current Smoker,          Cardiovascular hypertension, + Peripheral Vascular Disease     Neuro/Psych PSYCHIATRIC DISORDERS  Neuromuscular disease CVA    GI/Hepatic   Endo/Other  diabetes, Well ControlledHypothyroidism   Renal/GU ESRFRenal disease     Musculoskeletal   Abdominal   Peds  Hematology   Anesthesia Other Findings Poor historian  Reproductive/Obstetrics                          Anesthesia Physical Anesthesia Plan  ASA: III  Anesthesia Plan:    Post-op Pain Management:    Induction:   Airway Management Planned:   Additional Equipment:   Intra-op Plan:   Post-operative Plan:   Informed Consent:   Dental Advisory Given  Plan Discussed with: Anesthesiologist  Anesthesia Plan Comments:        Anesthesia Quick Evaluation

## 2013-02-27 NOTE — H&P (View-Only) (Signed)
[facility] [place] HISTORY AND PHYSICAL   History of Present Illness:  Patient is a 57 y.o. year old male who presents for evaluation of right arm swelling.    He has had chronic right arm swelling but was acutely worse the last two weeks, no headaches, no airway difficulty. Pt with multiple prior access procedures.  He has end stage renal disease CKD V and has a known right innominate vein stent that has been occluded since at least Dec 13.  He has a right forearm graft and prior right upper arm procedure.  He currently is dialyzing via a left femoral graft.  Recently had a neck exploration for goiter.   The goiter is quite extensive and may have mediastinal component.  CT done at [facility] not available for review but per report RIJ thrombosis as well as collapse of right innominate stent.  Pt currently on heparin for DVT or right arm/jugular vein.  Other medical problems include hyperlipidemia, prior CVA, GERD, remote cocaine use, GI bleed from Mallory Weiss remote, hypertension, diabetes, home O2.  All of these are currently stable.  Past Medical History  Diagnosis Date  . Hypothyroidism   . Hyperlipidemia   . CVA (cerebrovascular accident)     hx CVA x 2  . GI bleed     2006, mallory-weiss tear  . Pulmonary edema     nov 2011, dry wt decreased  . Goiter     by CT Apr 2013  . Depression   . Cocaine abuse     past history  . Tobacco abuse     history of  . GERD (gastroesophageal reflux disease)   . Cataract   . Insomnia   . Chronic pain   . HTN (hypertension)     sees Dr. Haque, [facility]  . Neuromuscular disorder     diabetic neuropathy  . Occlusion of carotid stent     Per patients nursing aid  . ESRD (end stage renal disease) on dialysis     hd - mwf - [place]  . Pneumonia     hx of  . Diabetes mellitus     Sees Dr. Ajay Kumar [phone] -- fasting blood 70-110s  . DKA (diabetic ketoacidosis)     severe, Dec 2011  . History of blood  transfusion   . On home oxygen therapy     2l nasal cannula at night    Past Surgical History  Procedure Laterality Date  . Av fistula placement  03/25/2008    right forearm  . Av fistula placement  04/23/2012    Procedure: INSERTION OF ARTERIOVENOUS (AV) GORE-TEX GRAFT THIGH;  Surgeon: Theran E Fields, MD;  Location: [facility] OR;  Service: Vascular;  Laterality: Left;  . Arteriovenous graft placement Left     thigh  . Carotid stent insertion Right   . Dyalisis catheter    . Eye surgery Bilateral     cataracts  . Thyroid surgery  01/28/2013    Dr Gerkin  . Lymph node biopsy Right 01/28/2013    Procedure: LYMPH NODE BIOPSY;  Surgeon: Todd M Gerkin, MD;  Location: [facility] OR;  Service: General;  Laterality: Right;  . Mass biopsy N/A 01/28/2013    Procedure: NECK EXPLORATION;  Surgeon: Todd M Gerkin, MD;  Location: [facility] OR;  Service: General;  Laterality: N/A;    Social History History  Substance Use Topics  . Smoking status: Current Every Day Smoker -- 0.25 packs/day    Types: Cigarettes  .   Smokeless tobacco: Never Used     Comment: pt states that he has been thinking about giving it up  . Alcohol Use: No    Family History No family history on file.  Allergies  Allergies  Allergen Reactions  . Penicillins      Current Facility-Administered Medications  Medication Dose Route Frequency Provider Last Rate Last Dose  . albuterol (PROVENTIL HFA;VENTOLIN HFA) 108 (90 BASE) MCG/ACT inhaler 2 puff  2 puff Inhalation Q4H PRN Jared M Gardner, DO      . amLODipine (NORVASC) tablet 10 mg  10 mg Oral QHS Jared M Gardner, DO      . aspirin EC tablet 81 mg  81 mg Oral Daily Jared M Gardner, DO   81 mg at 02/25/13 0914  . calcium acetate (PHOSLO) tablet 2 tablet  1,334 mg Oral TID Jared M Gardner, DO   2 tablet at 02/25/13 0915  . cloNIDine (CATAPRES) tablet 0.1 mg  0.1 mg Oral TID Jared M Gardner, DO   0.1 mg at 02/25/13 1030  . [START ON 02/26/2013] darbepoetin (ARANESP) injection 100 mcg  100  mcg Intravenous Q Wed-HD David W Zeyfang, PA-C      . docusate sodium (COLACE) capsule 100 mg  100 mg Oral BID Jared M Gardner, DO   100 mg at 02/25/13 1030  . feeding supplement (NEPRO CARB STEADY) liquid 237 mL  237 mL Oral BID BM Samantha J Worley, RD   237 mL at 02/25/13 1030  . ferric gluconate (NULECIT) 62.5 mg in sodium chloride 0.9 % 100 mL IVPB  62.5 mg Intravenous Weekly David W Zeyfang, PA-C      . FLUoxetine (PROZAC) 20 MG/5ML solution 5 mg  5 mg Oral Daily Nishant Dhungel, MD   5 mg at 02/25/13 1030  . heparin ADULT infusion 100 units/mL (25000 units/250 mL)  1,000 Units/hr Intravenous Continuous Anh P Pham, RPH 10 mL/hr at 02/25/13 0839 1,000 Units/hr at 02/25/13 0839  . HYDROcodone-acetaminophen (NORCO/VICODIN) 5-325 MG per tablet 1-2 tablet  1-2 tablet Oral Q4H PRN Jared M Gardner, DO      . [START ON 02/26/2013] influenza vac split quadrivalent PF (FLUARIX) injection 0.5 mL  0.5 mL Intramuscular Tomorrow-1000 Nishant Dhungel, MD      . insulin aspart (novoLOG) injection 0-9 Units  0-9 Units Subcutaneous TID WC Jared M Gardner, DO   3 Units at 02/25/13 0822  . insulin glargine (LANTUS) injection 13 Units  13 Units Subcutaneous Daily Jared M Gardner, DO   13 Units at 02/25/13 0915  . insulin glargine (LANTUS) injection 9 Units  9 Units Subcutaneous QHS Nishant Dhungel, MD      . levothyroxine (SYNTHROID, LEVOTHROID) tablet 112 mcg  112 mcg Oral QAC breakfast Jared M Gardner, DO   112 mcg at 02/25/13 0743  . metoprolol tartrate (LOPRESSOR) tablet 75 mg  75 mg Oral BID Jared M Gardner, DO   75 mg at 02/25/13 0913  . pantoprazole (PROTONIX) EC tablet 40 mg  40 mg Oral Daily Jared M Gardner, DO   40 mg at 02/25/13 0913  . promethazine (PHENERGAN) tablet 25 mg  25 mg Oral Q6H PRN Jared M Gardner, DO      . simvastatin (ZOCOR) tablet 5 mg  5 mg Oral q1800 Jared M Gardner, DO      . sodium chloride 0.9 % injection 3 mL  3 mL Intravenous Q12H Jared M Gardner, DO   3 mL at 02/25/13 0918  .  temazepam (RESTORIL)   capsule 15 mg  15 mg Oral Custom Nishant Dhungel, MD        ROS:   General:  No weight loss, Fever, chills  HEENT: No recent headaches, no nasal bleeding, no visual changes, no sore throat  Neurologic: No dizziness, blackouts, seizures. No recent symptoms of stroke or mini- stroke. No recent episodes of slurred speech, or temporary blindness.  Cardiac: No recent episodes of chest pain/pressure, no shortness of breath at rest.  + shortness of breath with exertion.  Denies history of atrial fibrillation or irregular heartbeat  Vascular: No history of rest pain in feet.  No history of claudication.  No history of non-healing ulcer, No history of DVT   Pulmonary: + home oxygen, no productive cough, no hemoptysis,  No asthma or wheezing  Musculoskeletal:  [ ] Arthritis, [ ] Low back pain,  [x ] Joint pain  Hematologic:No history of hypercoagulable state.  No history of easy bleeding.  No history of anemia  Gastrointestinal: No hematochezia or melena,  No gastroesophageal reflux, no trouble swallowing  Urinary: [x ] chronic Kidney disease, [ ] on HD - [x ] MWF or [ ] TTHS, [ ] Burning with urination, [ ] Frequent urination, [ ] Difficulty urinating;   Skin: No rashes  Psychological: No history of anxiety,  No history of depression   Physical Examination  Filed Vitals:   02/25/13 0103 02/25/13 0410 02/25/13 0800  BP: 162/68 151/63 155/77  Pulse: 62 70 69  Temp: 97.1 F (36.2 C) 97.4 F (36.3 C) 98.5 F (36.9 C)  TempSrc: Oral Oral Oral  Resp: 16 16   Height: 6' 2" (1.88 m)    Weight: 137 lb 2 oz (62.2 kg)    SpO2: 100% 100% 100%    Body mass index is 17.6 kg/(m^2).  General:  Alert and oriented, no acute distress HEENT: Normal Neck: No JVD, anterior neck mass consistant with thyroid mass, no stridor, no muffled voice Pulmonary: Clear to auscultation bilaterally Cardiac: Regular Rate and Rhythm Abdomen: Soft, non-tender, non-distended Skin: No  rash Extremity Pulses:  Right forearm graft pulsatile with some warmth in mid upper arm compared to left, forearm and right hand edema, right hand flexion contracture chronic, right mid upper arm and forearm and hand 2 x compared to left, left thigh graft + bruit Musculoskeletal: see above  Neurologic: Upper and lower extremity motor 5/5 and symmetric   ASSESSMENT:  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  Most likely some disruption of collateral flow after recent neck operation or right IJ thrombosis.  Right forearm graft is patent which probably makes the venous hypertension worse.   Possible graft infection in the right arm but less likely and warmth is probably more related to inflammation from venous hypertension.He has a functioning left thigh graft.    PLAN:  Continue heparin followed by coumadin for 6 months.  We will plan to ligate graft or remove it if infected on his next non dialysis day which is Thursday 11/13 by my partner Dr Dickson.  Procedure details risk benefit discussed.  This procedure may not totally eliminate his swelling but will hopefully improve somewhat.  Daiki Fields, MD [facility] [place] Office: [phone] Pager: [phone]  

## 2013-02-27 NOTE — Progress Notes (Signed)
General Surgery [facility]  Patient preparing for OR this AM.  Vascular surgery to ligate AVF in right forearm.  Will discuss options for non-surgical management of lymphocytic thyroiditis with endocrinologist.  Await SLP evaluation.  Monitor improving symptoms after ligation of fistula.  Will continue to follow with you.  Velora Heckler, MD, [facility] Office: [phone]

## 2013-02-27 NOTE — Evaluation (Signed)
Clinical/Bedside Swallow Evaluation Patient Details  Name: Marcus [name] MRN: [id number] Date of Birth: 1955/08/24  Today's Date: 02/27/2013 Time: 4098-1191 SLP Time Calculation (min): 23 min  Past Medical History:  Past Medical History  Diagnosis Date  . Hypothyroidism   . Hyperlipidemia   . CVA (cerebrovascular accident)     hx CVA x 2  . GI bleed     2006, mallory-weiss tear  . Pulmonary edema     nov 2011, dry wt decreased  . Goiter     by CT Apr 2013  . Depression   . Cocaine abuse     past history  . Tobacco abuse     history of  . GERD (gastroesophageal reflux disease)   . Cataract   . Insomnia   . Chronic pain   . HTN (hypertension)     sees Dr. Ardelle Park, [facility]  . Neuromuscular disorder     diabetic neuropathy  . Occlusion of carotid stent     Per patients nursing aid  . ESRD (end stage renal disease) on dialysis     hd - [facility] - [place]  . Pneumonia     hx of  . Diabetes mellitus     Sees Dr. Reather Littler [phone] -- fasting blood 70-110s  . DKA (diabetic ketoacidosis)     severe, Dec 2011  . History of blood transfusion   . On home oxygen therapy     2l nasal cannula at night   Past Surgical History:  Past Surgical History  Procedure Laterality Date  . Av fistula placement  03/25/2008    right forearm  . Av fistula placement  04/23/2012    Procedure: INSERTION OF ARTERIOVENOUS (AV) GORE-TEX GRAFT THIGH;  Surgeon: Sherren Kerns, MD;  Location: [facility] OR;  Service: Vascular;  Laterality: Left;  . Arteriovenous graft placement Left     thigh  . Carotid stent insertion Right   . Dyalisis catheter    . Eye surgery Bilateral     cataracts  . Thyroid surgery  01/28/2013    Dr Gerrit Friends  . Lymph node biopsy Right 01/28/2013    Procedure: LYMPH NODE BIOPSY;  Surgeon: Velora Heckler, MD;  Location[facility] OR;  Service: General;  Laterality: Right;  . Mass biopsy N/A 01/28/2013    Procedure: NECK EXPLORATION;  Surgeon: Velora Heckler, MD;  Location: [facility] OR;   Service: General;  Laterality: N/A;   HPI:  57 y.o. male with h/o ESRD (HD MWF) who presents to [facility] with worsening R arm swelling over the past week. His current HD access is in his L thigh and unrelated to this. Patient had a recent attempt at thyroidectomy performed last month by Dr. Gerrit Friends. The surgery per him was no where close to rt IJ, and since the thyroid mass extended well into the thoracic cavity, only biopsy was taken which showed thyroiditis. In the ED at [facility] he was found to have not only RUE DVT but thrombus in his R IJ as well. The enlargement of the thyroid appears to be extending down into his thoracic cavity (unchanged since prior CT). 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 [facility]. Per MD notes, pt recently began to c/o difficulty swallowing.   Assessment / Plan / Recommendation Clinical Impression  Pt presents without overt s/s of aspiration across bedside trials, and denies any swallowing difficulty.  Most recent CXR 10/09 is clear of any active lung disease. He does exhibit frequent eructation, and reports occasional symptoms of reflux. May wish to consider GI/esophageal w/u. Given an oropharyngeal swallow that appears to be within gross functional limits, no further speech f/u is recommended at this time. Recommend to continue current diet of Dys 3 textures and thin liquids.    Aspiration Risk  Mild    Diet Recommendation Dysphagia 3 (Mechanical Soft);Thin liquid   Liquid Administration via: Cup;Straw;No straw Medication Administration: Whole meds with liquid Supervision: Patient able to self feed;Intermittent supervision to cue for compensatory strategies Compensations: Slow rate;Small sips/bites;Follow solids with liquid Postural Changes and/or Swallow Maneuvers: Seated upright 90 degrees;Upright 30-60 min after meal    Other   Recommendations Oral Care Recommendations: Oral care BID   Follow Up Recommendations  None    Frequency and Duration        Pertinent Vitals/Pain N/A    SLP Swallow Goals  N/A   Swallow Study Prior Functional Status       General Date of Onset: 02/25/13 HPI: 57 y.o. male with h/o ESRD (HD MWF) who presents to [place] with worsening R arm swelling over the past week. His current HD access is in his L thigh and unrelated to this. Patient had a recent attempt at thyroidectomy performed last month by Dr. Gerrit Friends. The surgery per him was no where close to rt IJ, and since the thyroid mass extended well into the thoracic cavity, only biopsy was taken which showed thyroiditis. In the ED at [facility] he was found to have not only RUE DVT but thrombus in his R IJ as well. The enlargement of the thyroid appears to be extending down into his thoracic cavity (unchanged since prior CT). 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 [facility] was thus transferred [facility]. Per MD notes, pt recently began to c/o difficulty swallowing. Type of Study: Bedside swallow evaluation Previous Swallow Assessment: none in chart Diet Prior to this Study: Dysphagia 3 (soft);Thin liquids Temperature Spikes Noted: No Respiratory Status: Room air History of Recent Intubation: No Behavior/Cognition: Alert;Requires cueing Oral Cavity - Dentition: Edentulous (does not own dentures) Self-Feeding Abilities: Able to feed self Patient Positioning: Upright in bed Baseline Vocal Quality: Clear Volitional Cough: Strong Volitional Swallow: Able to elicit    Oral/Motor/Sensory Function Overall Oral Motor/Sensory Function: Appears within functional limits for tasks assessed   Ice Chips Ice chips: Not tested   Thin Liquid Thin Liquid: Impaired Presentation: Cup;Self Fed;Straw Pharyngeal  Phase Impairments: Decreased hyoid-laryngeal  movement    Nectar Thick Nectar Thick Liquid: Not tested   Honey Thick Honey Thick Liquid: Not tested   Puree Puree: Impaired Presentation: Spoon;Self Fed Pharyngeal Phase Impairments: Decreased hyoid-laryngeal movement   Solid   GO    Solid: Impaired Presentation: Self Fed Oral Phase Functional Implications: Other (comment) (prolonged oral phase suspect due to edentulous state) Pharyngeal Phase Impairments: Decreased hyoid-laryngeal movement       Maxcine Ham 02/27/2013,5:22 PM   Maxcine Ham, M.A. CCC-SLP [phone]

## 2013-02-27 NOTE — Progress Notes (Signed)
[facility] PROGRESS NOTE  Marcus [name] ZOX:[id number] DOB: 1956-03-13 DOA: 02/25/2013 PCP: Galvin Proffer, MD  Brief narrative 57 y.o. male with h/o ESRD (HD MWF) who presents to [facility] with worsening R arm swelling over the past week. His current HD access is in his L thigh and unrelated to this. Patient had a recent  attempt at thyroidectomy performed last month by Dr. Gerrit Friends. 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  In the ED at [facility] he was found to have not only RUE DVT but thrombus in his R IJ as well. The enlargement of the thyroid appears to be extending down into his thoracic cavity (unchanged since prior CT). 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  [facility] was thus [facility].    Assessment/Plan:  Rt IJ thrombus  placed on heparin drip. Possible from direct compression of IJ by enlarged thyroid. Appreciate consultation by general surgery, vascular surgery and interventional radiology. On IV heparin drip. Recommend IV heparin and start on coumadin. Ligation of right AV graft done today CT reports unchanged airway compression. patient in no resp distress. does c/o some dysphagia which is new. Will get swallow eval.   Thyroid mass  Thyroiditis per recent bx   ESRD on HD ( M,W,F)  renal following .has left thigh graft   Type 1 DM  Noted for low fsg in the morning. Will hold lantus and premeal aspart. Monitor on SSI only. Patient has poor po intake. Nutrition consulted. Marland Kitchen   HTN  continue home meds   Hx of CVA  continue ASA, statin   GERD  continue PPI   DIET: dys level 3   DVT prophylaxis: IV heparin , hold coumadin until procedure tomorrow.  Code Status: full code  Family Communication: Left messages family Disposition Plan:  pending    Consultants:  [facility]  vascular surgery ( Dr Darrick Penna)  IR   Procedures:  For right AV graft ligation done 11/13  Antibiotics:  None  HPI/Subjective:  Patient's only complaint is of some neck pain. Denies any shortness of breath    Objective: Filed Vitals:   02/27/13 1651  BP: 157/68  Pulse: 64  Temp: 98.1 F (36.7 C)  Resp: 19    Intake/Output Summary (Last 24 hours) at 02/27/13 1757 Last data filed at 02/27/13 1500  Gross per 24 hour  Intake   1028 ml  Output      2 ml  Net   1026 ml   Filed Weights   02/26/13 1328 02/26/13 1754 02/26/13 2013  Weight: 67 kg (147 lb 11.3 oz) 64.9 kg (143 lb 1.3 oz) 64.901 kg (143 lb 1.3 oz)    Exam: General: Alert and oriented x2 Middle aged male in NAD  HEENT: no pallor, moist mucosa, enlarged thyroid  Chest: Labored breathing, but lungs clear auscultation bilaterally  CVS: Regular rate and rhythm, S1-S2 Abd: Soft, nontender, nondistended, positive bowel sounds  Ext: swollen rt arm and forearm , left groin AV graft    Data Reviewed: Basic Metabolic Panel:  Recent Labs Lab 02/25/13 0556 02/26/13 1326 02/27/13 0500  NA 131* 128* 132*  K 3.9 4.7 3.5  CL 90* 88* 93*  CO2 25 24 26   GLUCOSE 281* 176* 36*  BUN 24* 41* 18  CREATININE 5.27* 7.10* 4.10*  CALCIUM 8.6 8.6 8.6  PHOS  --  4.1  --    Liver Function Tests:  Recent Labs Lab 02/26/13 1326  ALBUMIN 2.9*   No results found for this basename: LIPASE, AMYLASE,  in the last 168 hours No results found for this basename: AMMONIA,  in the last 168 hours CBC:  Recent Labs Lab 02/25/13 0556 02/26/13 0400 02/27/13 0500  WBC 5.1 6.5 7.2  HGB 11.0* 10.4* 11.2*  HCT 35.0* 32.6* 35.3*  MCV 98.0 96.7 97.0  PLT 256 253 255   Cardiac Enzymes: No results found for this basename: CKTOTAL, CKMB, CKMBINDEX, TROPONINI,  in the last 168 hours BNP (last 3 results) No results found for this basename: PROBNP,  in the last 8760 hours CBG:  Recent Labs Lab  02/27/13 0711 02/27/13 0954 02/27/13 1058 02/27/13 1141 02/27/13 1650  GLUCAP 121* 64* 92 101* 270*    Recent Results (from the past 240 hour(s))  MRSA PCR SCREENING     Status: None   Collection Time    02/25/13  1:36 AM      Result Value Range Status   MRSA by PCR NEGATIVE  NEGATIVE Final   Comment:            The GeneXpert MRSA Assay (FDA     approved for NASAL specimens     only), is one component of a     comprehensive MRSA colonization     surveillance program. It is not     intended to diagnose MRSA     infection nor to guide or     monitor treatment for     MRSA infections.     Studies: No results found.  Scheduled Meds: . amLODipine  10 mg Oral QHS  . aspirin EC  81 mg Oral Daily  . calcium acetate  1,334 mg Oral TID WC  . cloNIDine  0.1 mg Oral TID  . darbepoetin (ARANESP) injection - DIALYSIS  100 mcg Intravenous Q Wed-HD  . docusate sodium  100 mg Oral BID  . feeding supplement (RESOURCE BREEZE)  1 Container Oral BID BM  . ferric gluconate (FERRLECIT/NULECIT) IV  62.5 mg Intravenous Weekly  . FLUoxetine  5 mg Oral Daily  . influenza vac split quadrivalent PF  0.5 mL Intramuscular Tomorrow-1000  . insulin aspart  0-9 Units Subcutaneous TID WC  . levothyroxine  112 mcg Oral QAC breakfast  . metoprolol  75 mg Oral BID  . pantoprazole  40 mg Oral Daily  . simvastatin  5 mg Oral q1800  . sodium chloride  3 mL Intravenous Q12H  . temazepam  15 mg Oral Custom  . Warfarin - Pharmacist Dosing Inpatient   Does not apply q1800   Continuous Infusions: . sodium chloride 20 mL/hr at 02/27/13 1211  . heparin 1,700 Units/hr (02/27/13 1645)      Time spent: 20 minutes    [name]  [facility] Pager [phone] If 7PM-7AM, please contact night-coverage at [email or web address], password [facility] 02/27/2013, 5:57 PM  LOS: 2 days

## 2013-02-27 NOTE — Progress Notes (Signed)
ANTICOAGULATION CONSULT NOTE - Follow Up Consult  Pharmacy Consult for heparin Indication: DVT  Labs:  Recent Labs  02/25/13 0556 02/25/13 1620 02/26/13 0400 02/26/13 0602 02/26/13 1326 02/26/13 1955 02/27/13 0500  HGB 11.0*  --  10.4*  --   --   --  11.2*  HCT 35.0*  --  32.6*  --   --   --  35.3*  PLT 256  --  253  --   --   --  255  LABPROT  --  12.6 13.6  --   --   --  15.8*  INR  --  0.96 1.06  --   --   --  1.29  HEPARINUNFRC <0.10* <0.10*  --  <0.10*  --  0.17* 0.35  CREATININE 5.27*  --   --   --  7.10*  --  4.10*    Assessment/Plan:  57yo male now therapeutic on heparin after multiple rate increases.  Will continue gtt at current rate and confirm stable with additional level.  Vernard Gambles, PharmD, BCPS  02/27/2013,7:09 AM

## 2013-02-27 NOTE — Anesthesia Postprocedure Evaluation (Signed)
  Anesthesia Post-op Note  Patient: Marcus [name]  Procedure(s) Performed: Procedure(s): LIGATION ARTERIOVENOUS GORTEX GRAFT (Right)  Patient Location: PACU  Anesthesia Type:MAC  Level of Consciousness: awake, alert  and oriented  Airway and Oxygen Therapy: Patient Spontanous Breathing  Post-op Pain: none  Post-op Assessment: Post-op Vital signs reviewed, Patient's Cardiovascular Status Stable, Respiratory Function Stable, Patent Airway, No signs of Nausea or vomiting and Pain level controlled  Post-op Vital Signs: stable  Complications: No apparent anesthesia complications

## 2013-02-27 NOTE — Progress Notes (Signed)
Subjective:  Arm pain improving . Tolerated hd yesterday Objective Vital signs in last 24 hours: Filed Vitals:   02/26/13 1743 02/26/13 1754 02/26/13 2013 02/27/13 0410  BP: 122/62 150/75 109/45 153/64  Pulse: 66 65 67 67  Temp:  98.1 F (36.7 C) 98.1 F (36.7 C) 97.8 F (36.6 C)  TempSrc:  Oral Oral Oral  Resp: 14 13 13 16   Height:      Weight:  64.9 kg (143 lb 1.3 oz) 64.901 kg (143 lb 1.3 oz)   SpO2:  98% 100% 100%   Weight change: 4.8 kg (10 lb 9.3 oz)  Intake/Output Summary (Last 24 hours) at 02/27/13 0857 Last data filed at 02/27/13 0411  Gross per 24 hour  Intake   1038 ml  Output   2049 ml  Net  -1011 ml   Labs: Basic Metabolic Panel:  Recent Labs Lab 02/25/13 0556 02/26/13 1326 02/27/13 0500  NA 131* 128* 132*  K 3.9 4.7 3.5  CL 90* 88* 93*  CO2 25 24 26   GLUCOSE 281* 176* 36*  BUN 24* 41* 18  CREATININE 5.27* 7.10* 4.10*  CALCIUM 8.6 8.6 8.6  PHOS  --  4.1  --    Liver Function Tests:  Recent Labs Lab 02/26/13 1326  ALBUMIN 2.9*   No results found for this basename: LIPASE, AMYLASE,  in the last 168 hours No results found for this basename: AMMONIA,  in the last 168 hours CBC:  Recent Labs Lab 02/25/13 0556 02/26/13 0400 02/27/13 0500  WBC 5.1 6.5 7.2  HGB 11.0* 10.4* 11.2*  HCT 35.0* 32.6* 35.3*  MCV 98.0 96.7 97.0  PLT 256 253 255   Cardiac Enzymes: No results found for this basename: CKTOTAL, CKMB, CKMBINDEX, TROPONINI,  in the last 168 hours CBG:  Recent Labs Lab 02/26/13 0824 02/26/13 1141 02/26/13 1859 02/26/13 2011 02/27/13 0711  GLUCAP 147* 159* 46* 136* 121*    Iron Studies: No results found for this basename: IRON, TIBC, TRANSFERRIN, FERRITIN,  in the last 72 hours Studies/Results: No results found. Medications: . heparin 1,650 Units/hr (02/26/13 2136)   . [MAR HOLD] amLODipine  10 mg Oral QHS  . [facility] HOLD] aspirin EC  81 mg Oral Daily  . [facility] HOLD] calcium acetate  1,334 mg Oral TID WC  . [facility] HOLD]  cloNIDine  0.1 mg Oral TID  . [MAR HOLD] darbepoetin (ARANESP) injection - DIALYSIS  100 mcg Intravenous Q Wed-HD  . [facility] HOLD] docusate sodium  100 mg Oral BID  . [MAR HOLD] feeding supplement (RESOURCE BREEZE)  1 Container Oral BID BM  . [facility] HOLD] ferric gluconate (FERRLECIT/NULECIT) IV  62.5 mg Intravenous Weekly  . [MAR HOLD] FLUoxetine  5 mg Oral Daily  . [MAR HOLD] influenza vac split quadrivalent PF  0.5 mL Intramuscular Tomorrow-1000  . [MAR HOLD] insulin aspart  0-9 Units Subcutaneous TID WC  . [facility] HOLD] levothyroxine  112 mcg Oral QAC breakfast  . [MAR HOLD] metoprolol  75 mg Oral BID  . [MAR HOLD] pantoprazole  40 mg Oral Daily  . [facility] HOLD] simvastatin  5 mg Oral q1800  . [MAR HOLD] sodium chloride  3 mL Intravenous Q12H  . [MAR HOLD] temazepam  15 mg Oral Custom  . [MAR HOLD] vancomycin  1,000 mg Other To OR  . [facility] HOLD] Warfarin - Pharmacist Dosing Inpatient   Does not apply q1800  .  Physical Exam:  General: Thin BM in NAD  Heart: RRR . No rub or  mur  Lungs: CTA  Abdomen: BS Pos. Soft ,nontender  Extremities: No pedal edema/ R Upper extremity Edema persists  But improving some Dialysis Access: Pos bruit L FEM AVGG/ R Fore Arm AVGG pos bruit and  swelling of area.   Dialysis Orders: Center: [facility] .  EDW 61 kg HD Bath 2.0 k Time 2.25 Heparin 2400. Access  l fem AVGG BFR 450 DFR af 1.5  Hectorol 0 (pth 56) mcg IV/HD Epogen 6800 Units IV/HD Venofer 50mg  per week   Assessment/Plan  1. 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 ESRD - HD MWF ( [facility]) on schedule  3. Hypertension/volume - 153/64 on Amlodipine 10mg  hs, Clonidine.1mg  tid Hold am hd, lopressor 50mg  hs( not sure takes appropriately at home) monitor for BP drops with HD / may need to taper dow post hd 64.9 yesterday BED WT 61 kg ewd/ need standing wt 4. Anemia - HGB 11.0 >10.4>11.2 continue epo/ venofer on  5. Metabolic  bone disease - No hectorol / binder  calcium acetate / last phos 4.1. 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 Nutrition - renal . Carb Mod / renavite 8. Type 1 dm - per adm   Lenny Pastel, PA-C [facility] Beeper [phone] 02/27/2013,8:57 AM  LOS: 2 days

## 2013-02-27 NOTE — Anesthesia Procedure Notes (Signed)
Procedure Name: MAC Date/Time: 02/27/2013 10:20 AM Performed by: Carmela Rima Pre-anesthesia Checklist: Patient identified, Timeout performed, Emergency Drugs available, Suction available and Patient being monitored Oxygen Delivery Method: Nasal cannula Placement Confirmation: positive ETCO2

## 2013-02-27 NOTE — Progress Notes (Signed)
Hypoglycemic Event  CBG: 36  Treatment: D50 IV  Symptoms: none, patient alert and oriented  Follow-up CBG: Time:0710 CBG Result:121  Possible Reasons for Event: poor diet/NPO  Comments/MD notified:dhungel    Colie Josten B  Remember to initiate Hypoglycemia Order Set & complete

## 2013-02-28 LAB — CBC
HCT: 33 % — ABNORMAL LOW (ref 39.0–52.0)
Hemoglobin: 10.5 g/dL — ABNORMAL LOW (ref 13.0–17.0)
MCH: 30.3 pg (ref 26.0–34.0)
MCHC: 31.8 g/dL (ref 30.0–36.0)
MCV: 95.4 fL (ref 78.0–100.0)
RBC: 3.46 MIL/uL — ABNORMAL LOW (ref 4.22–5.81)

## 2013-02-28 LAB — GLUCOSE, CAPILLARY
Glucose-Capillary: 162 mg/dL — ABNORMAL HIGH (ref 70–99)
Glucose-Capillary: 208 mg/dL — ABNORMAL HIGH (ref 70–99)

## 2013-02-28 LAB — RENAL FUNCTION PANEL
Albumin: 3.1 g/dL — ABNORMAL LOW (ref 3.5–5.2)
BUN: 29 mg/dL — ABNORMAL HIGH (ref 6–23)
Creatinine, Ser: 5.38 mg/dL — ABNORMAL HIGH (ref 0.50–1.35)
GFR calc non Af Amer: 11 mL/min — ABNORMAL LOW (ref >90)
Glucose, Bld: 171 mg/dL — ABNORMAL HIGH (ref 70–99)
Phosphorus: 3.7 mg/dL (ref 2.3–4.6)
Potassium: 4.5 mEq/L (ref 3.5–5.1)

## 2013-02-28 LAB — PROTIME-INR: Prothrombin Time: 16.6 seconds — ABNORMAL HIGH (ref 11.6–15.2)

## 2013-02-28 MED ORDER — RENA-VITE PO TABS
1.0000 | ORAL_TABLET | Freq: Every day | ORAL | Status: DC
  Administered 2013-02-28 – 2013-03-09 (×10): 1 via ORAL
  Filled 2013-02-28 (×13): qty 1

## 2013-02-28 MED ORDER — INSULIN DETEMIR 100 UNIT/ML ~~LOC~~ SOLN
5.0000 [IU] | Freq: Every day | SUBCUTANEOUS | Status: DC
  Administered 2013-02-28 – 2013-03-01 (×2): 5 [IU] via SUBCUTANEOUS
  Filled 2013-02-28 (×4): qty 0.05

## 2013-02-28 MED ORDER — WARFARIN SODIUM 7.5 MG PO TABS
7.5000 mg | ORAL_TABLET | Freq: Once | ORAL | Status: AC
  Administered 2013-02-28: 7.5 mg via ORAL
  Filled 2013-02-28: qty 1

## 2013-02-28 MED ORDER — OXYCODONE-ACETAMINOPHEN 5-325 MG PO TABS
ORAL_TABLET | ORAL | Status: AC
  Filled 2013-02-28: qty 2

## 2013-02-28 NOTE — Progress Notes (Signed)
[facility] PROGRESS NOTE  Marcus [name] ZOX:[id number] DOB: [birth year] DOA: 02/25/2013 PCP: Galvin Proffer, MD  Brief narrative 57 y.o. male with h/o ESRD (HD MWF) who presents to [facility] with worsening R arm swelling over the past week. His current HD access is in his L thigh and unrelated to this. Patient had a recent  attempt at thyroidectomy performed last month by Dr. Gerrit Friends. 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  In the ED at [facility] he was found to have not only RUE DVT but thrombus in his R IJ as well. The enlargement of the thyroid appears to be extending down into his thoracic cavity (unchanged since prior CT). 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  He was thus transferred to [facility] for [facility] evaluation.    Assessment/Plan:  Rt IJ thrombus  placed on heparin drip. Possible from direct compression of IJ by enlarged thyroid. Appreciate consultation by general surgery, vascular surgery and interventional radiology. On IV heparin drip. Recommend IV heparin and start on coumadin. Ligation of right AV graft done today CT reports unchanged airway compression. patient in no resp distress. does c/o some dysphagia which is new, awaiting followup by speech therapy for swallow evaluation  Thyroid mass  Thyroiditis per recent bx   ESRD on HD ( M,W,F)  renal following .has left thigh graft   Type 1 DM  Noted for low fsg in the morning. Will hold lantus and premeal aspart. Monitor on SSI only. Patient has poor po intake. Nutrition consulted.  HTN  continue home meds   Hx of CVA  continue ASA, statin   GERD  continue PPI   DIET: dys level 3   DVT prophylaxis: IV heparin , hold coumadin until procedure tomorrow.  Code Status: full code  Family Communication: Left  messages family Disposition Plan: pending    Consultants:  CCS  vascular surgery ( Dr Darrick Penna)  IR   Procedures:  For right AV graft ligation done 11/13  Antibiotics:  None  HPI/Subjective:  Patient's only complaint is of some neck pain. Denies any shortness of breath   Objective: Filed Vitals:   02/28/13 0500  BP: 154/70  Pulse: 58  Temp: 97.2 F (36.2 C)  Resp: 18    Intake/Output Summary (Last 24 hours) at 02/28/13 0844 Last data filed at 02/28/13 0600  Gross per 24 hour  Intake 1506.5 ml  Output      2 ml  Net 1504.5 ml   Filed Weights   02/26/13 1754 02/26/13 2013 02/27/13 2028  Weight: 64.9 kg (143 lb 1.3 oz) 64.901 kg (143 lb 1.3 oz) 64.901 kg (143 lb 1.3 oz)    Exam: General: alert and oriented x2, no acute distress HEENT: no pallor, moist mucosa, enlarged thyroid  Chest: Labored breathing, but lungs clear auscultation bilaterally  CVS: Regular rate and rhythm, S1-S2 Abd: Soft, nontender, nondistended, positive bowel sounds  Ext: swollen rt arm and forearm , left groin AV graft    Data Reviewed: Basic Metabolic Panel:  Recent Labs Lab 02/25/13 0556 02/26/13 1326 02/27/13 0500  NA 131* 128* 132*  K 3.9 4.7 3.5  CL 90* 88* 93*  CO2 25 24 26   GLUCOSE 281* 176* 36*  BUN 24* 41* 18  CREATININE 5.27* 7.10* 4.10*  CALCIUM  8.6 8.6 8.6  PHOS  --  4.1  --    Liver Function Tests:  Recent Labs Lab 02/26/13 1326  ALBUMIN 2.9*   No results found for this basename: LIPASE, AMYLASE,  in the last 168 hours No results found for this basename: AMMONIA,  in the last 168 hours CBC:  Recent Labs Lab 02/25/13 0556 02/26/13 0400 02/27/13 0500 02/28/13 0655  WBC 5.1 6.5 7.2 6.0  HGB 11.0* 10.4* 11.2* 10.5*  HCT 35.0* 32.6* 35.3* 33.0*  MCV 98.0 96.7 97.0 95.4  PLT 256 253 255 240   Cardiac Enzymes: No results found for this basename: CKTOTAL, CKMB, CKMBINDEX, TROPONINI,  in the last 168 hours BNP (last 3 results) No results found for this  basename: PROBNP,  in the last 8760 hours CBG:  Recent Labs Lab 02/27/13 1058 02/27/13 1141 02/27/13 1650 02/27/13 2032 02/28/13 0742  GLUCAP 92 101* 270* 324* 162*    Recent Results (from the past 240 hour(s))  MRSA PCR SCREENING     Status: None   Collection Time    02/25/13  1:36 AM      Result Value Range Status   MRSA by PCR NEGATIVE  NEGATIVE Final   Comment:            The GeneXpert MRSA Assay (FDA     approved for NASAL specimens     only), is one component of a     comprehensive MRSA colonization     surveillance program. It is not     intended to diagnose MRSA     infection nor to guide or     monitor treatment for     MRSA infections.     Studies: No results found.  Scheduled Meds: . amLODipine  10 mg Oral QHS  . aspirin EC  81 mg Oral Daily  . calcium acetate  1,334 mg Oral TID WC  . cloNIDine  0.1 mg Oral TID  . darbepoetin (ARANESP) injection - DIALYSIS  100 mcg Intravenous Q Wed-HD  . docusate sodium  100 mg Oral BID  . feeding supplement (RESOURCE BREEZE)  1 Container Oral BID BM  . ferric gluconate (FERRLECIT/NULECIT) IV  62.5 mg Intravenous Weekly  . FLUoxetine  5 mg Oral Daily  . influenza vac split quadrivalent PF  0.5 mL Intramuscular Tomorrow-1000  . insulin aspart  0-9 Units Subcutaneous TID WC  . levothyroxine  112 mcg Oral QAC breakfast  . metoprolol  75 mg Oral BID  . pantoprazole  40 mg Oral Daily  . simvastatin  5 mg Oral q1800  . sodium chloride  3 mL Intravenous Q12H  . temazepam  15 mg Oral Custom  . Warfarin - Pharmacist Dosing Inpatient   Does not apply q1800   Continuous Infusions: . sodium chloride 20 mL/hr at 02/27/13 1211  . heparin 1,700 Units/hr (02/28/13 0815)      Time spent: 20 minutes    [name]  [facility] Pager [phone] If 7PM-7AM, please contact night-coverage at [email or web address], password [facility] 02/28/2013, 8:44 AM  LOS: 3 days

## 2013-02-28 NOTE — Progress Notes (Signed)
Inpatient Diabetes Program Recommendations  AACE/ADA: New Consensus Statement on Inpatient Glycemic Control (2013)  Target Ranges:  Prepandial:   less than 140 mg/dL      Peak postprandial:   less than 180 mg/dL (1-2 hours)      Critically ill patients:  140 - 180 mg/dL   Inpatient Diabetes Program Recommendations Insulin - Basal: consider adding low dose basal Levemir 5 units Thank you  Piedad Climes BSN, RN,CDE Inpatient Diabetes Coordinator [phone] (team pager)

## 2013-02-28 NOTE — Procedures (Signed)
I was present at this dialysis session. I have reviewed the session itself and made appropriate changes.    AVG Qb 400.  Doing well.    Sabra Heck  MD 02/28/2013, 3:25 PM

## 2013-02-28 NOTE — Progress Notes (Signed)
Some soreness right arm.  Edema already appears better.  Will sign off  Fabienne Bruns, MD [facility] Office: [phone] Pager: [phone]

## 2013-02-28 NOTE — Progress Notes (Signed)
Utilization review completed.  

## 2013-02-28 NOTE — Progress Notes (Signed)
ANTICOAGULATION CONSULT NOTE - Follow Up Consult  Pharmacy Consult for Heparin (also on warfarin) Indication: DVT  Allergies  Allergen Reactions  . Penicillins Other (See Comments)    Unknown reaction    Patient Measurements: Height: 6\' 2"  (188 cm) Weight: 143 lb 1.3 oz (64.901 kg) IBW/kg (Calculated) : 82.2  Vital Signs: Temp: 99 F (37.2 C) (11/13 2028) Temp src: Oral (11/13 2028) BP: 165/72 mmHg (11/13 2151) Pulse Rate: 65 (11/13 2151)  Labs:  Recent Labs  02/25/13 0556 02/25/13 1620 02/26/13 0400  02/26/13 1326  02/27/13 0500 02/27/13 1340 02/28/13 0115  HGB 11.0*  --  10.4*  --   --   --  11.2*  --   --   HCT 35.0*  --  32.6*  --   --   --  35.3*  --   --   PLT 256  --  253  --   --   --  255  --   --   LABPROT  --  12.6 13.6  --   --   --  15.8*  --   --   INR  --  0.96 1.06  --   --   --  1.29  --   --   HEPARINUNFRC <0.10* <0.10*  --   < >  --   < > 0.35 <0.10* 0.31  CREATININE 5.27*  --   --   --  7.10*  --  4.10*  --   --   < > = values in this interval not displayed.  Estimated Creatinine Clearance: 18.2 ml/min (by C-G formula based on Cr of 4.1).   Medications:  Heparin 1700 units/hr  Assessment: 57 y/o M on heparin/warfarin overlap. HL 0.31.   Goal of Therapy:  Heparin level 0.3-0.7 units/ml Monitor platelets by anticoagulation protocol: Yes   Plan:  -Continue heparin drip at 1700 units/hr -8 hour HL at 0900 -Daily CBC/HL -Monitor for bleeding -Warfarin per previous note  Thank you for allowing me to take part in this patient's care,  Abran Duke, PharmD Clinical Pharmacist Phone: [phone] Pager: [phone] 02/28/2013 2:12 AM

## 2013-02-28 NOTE — Progress Notes (Signed)
[facility] Progress Note  Subjective:   Rt arm and neck pain stated as controlled. In good spirits  Objective Filed Vitals:   02/27/13 1651 02/27/13 2028 02/27/13 2151 02/28/13 0500  BP: 157/68 175/68 165/72 154/70  Pulse: 64 63 65 58  Temp: 98.1 F (36.7 C) 99 F (37.2 C)  97.2 F (36.2 C)  TempSrc:  Oral  Oral  Resp: 19 18  18   Height:      Weight:  64.901 kg (143 lb 1.3 oz)    SpO2: 100% 98%  100%   Physical Exam General: Thin, ill-appearing, NAD Heart: RRR, no murmur or rub appreciated Lungs: Rhonchi at bilat bases, No crackles or wheezes Abdomen:Soft, NT, +BS Extremities: RUE edema, No LE edema Dialysis Access: L Fem AVG + bruit, RFA AVG ligated/ significant edema/ clean incision  Dialysis Orders: Center: [facility] on [facility] .  EDW 61 kg HD Bath 2.0 k Time 4:15 2.25 Heparin 2400. Access  l fem AVGG BFR 450 DFR af 1.5  Hectorol 0 (pth 56) mcg IV/HD Epogen 6800 Units IV/HD Venofer 50mg  per week   Assessment/Plan: 1. R IJ DVT/ Thrombus Axillary and Brachiocephalic Veins -   IV heparin and Coumadin bridge per pharmacy, RFA AVG ligated on 11/13 per Dr. Edilia Bo VVS 2. ESRD - HD MWF, K+ 3.5, HD today, added K bath 3. Hypertension/volume - SBPs 150s on Amlodipine 10mg  hs, Clonidine 1mg  tid Hold am hd, lopressor 50mg  hs( not sure takes appropriately at home) monitor for BP drops with HD / may need to taper edw. Standing wgts with HD today 4. Anemia - HGB 10.5 < 11.2. Aranesp 100 q Wed. Weekly Fe on Tuesdays. Follow CBC  5. Metabolic bone disease - Corr Ca 9.5. P controlled. Continue Phoslo. No Vit D. Renal panel pending. 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 Nutrition - Dys 3 diet. Add fluid restr. Cont multivitamin, Breeze 8. Type 1 dm - per adm  [name] Thad Ranger [facility] Pager [phone] 02/28/2013,9:09 AM  LOS: 3 days    Additional Objective Labs: Basic Metabolic Panel:  Recent Labs Lab 02/25/13 0556 02/26/13 1326  02/27/13 0500  NA 131* 128* 132*  K 3.9 4.7 3.5  CL 90* 88* 93*  CO2 25 24 26   GLUCOSE 281* 176* 36*  BUN 24* 41* 18  CREATININE 5.27* 7.10* 4.10*  CALCIUM 8.6 8.6 8.6  PHOS  --  4.1  --    Liver Function Tests:  Recent Labs Lab 02/26/13 1326  ALBUMIN 2.9*   CBC:  Recent Labs Lab 02/25/13 0556 02/26/13 0400 02/27/13 0500 02/28/13 0655  WBC 5.1 6.5 7.2 6.0  HGB 11.0* 10.4* 11.2* 10.5*  HCT 35.0* 32.6* 35.3* 33.0*  MCV 98.0 96.7 97.0 95.4  PLT 256 253 255 240   Blood Culture    Component Value Date/Time   SDES BLOOD RIGHT HAND 09/22/2011 1145   SPECREQUEST BOTTLES DRAWN AEROBIC ONLY 5CC 09/22/2011 1145   CULT NO GROWTH 5 DAYS 09/22/2011 1145   REPTSTATUS 09/29/2011 FINAL 09/22/2011 1145    CBG:  Recent Labs Lab 02/27/13 1058 02/27/13 1141 02/27/13 1650 02/27/13 2032 02/28/13 0742  GLUCAP 92 101* 270* 324* 162*   Studies/Results: No results found.  Medications: . sodium chloride 20 mL/hr at 02/27/13 1211  . heparin 1,700 Units/hr (02/28/13 0815)   . amLODipine  10 mg Oral QHS  . aspirin EC  81 mg Oral Daily  . calcium acetate  1,334 mg Oral TID WC  .  cloNIDine  0.1 mg Oral TID  . darbepoetin (ARANESP) injection - DIALYSIS  100 mcg Intravenous Q Wed-HD  . docusate sodium  100 mg Oral BID  . feeding supplement (RESOURCE BREEZE)  1 Container Oral BID BM  . ferric gluconate (FERRLECIT/NULECIT) IV  62.5 mg Intravenous Weekly  . FLUoxetine  5 mg Oral Daily  . influenza vac split quadrivalent PF  0.5 mL Intramuscular Tomorrow-1000  . insulin aspart  0-9 Units Subcutaneous TID WC  . levothyroxine  112 mcg Oral QAC breakfast  . metoprolol  75 mg Oral BID  . pantoprazole  40 mg Oral Daily  . simvastatin  5 mg Oral q1800  . sodium chloride  3 mL Intravenous Q12H  . temazepam  15 mg Oral Custom  . Warfarin - Pharmacist Dosing Inpatient   Does not apply [phone]

## 2013-02-28 NOTE — Progress Notes (Signed)
I saw the patient and agree with the above assessment and plan.    Pt w/o complaint in dialysis.  Feels well.  Thinks arm is somewhat better.  On hep gtt, transitioning to coumadin.

## 2013-02-28 NOTE — Progress Notes (Addendum)
ANTICOAGULATION CONSULT NOTE - Follow Up Consult  Pharmacy Consult for Heparin / Coumadin (Overlap D#2/5) Indication: DVT  Allergies  Allergen Reactions  . Penicillins Other (See Comments)    Unknown reaction    Patient Measurements: Height: 6\' 2"  (188 cm) Weight: 143 lb 1.3 oz (64.901 kg) IBW/kg (Calculated) : 82.2 Heparin dosing weight = 62 kg  Vital Signs: Temp: 97.8 F (36.6 C) (11/14 0929) Temp src: Oral (11/14 0500) BP: 159/72 mmHg (11/14 0929) Pulse Rate: 62 (11/14 0929)  Labs:  Recent Labs  02/26/13 0400  02/26/13 1326  02/27/13 0500 02/27/13 1340 02/28/13 0115 02/28/13 0655 02/28/13 0840  HGB 10.4*  --   --   --  11.2*  --   --  10.5*  --   HCT 32.6*  --   --   --  35.3*  --   --  33.0*  --   PLT 253  --   --   --  255  --   --  240  --   LABPROT 13.6  --   --   --  15.8*  --   --  16.6*  --   INR 1.06  --   --   --  1.29  --   --  1.38  --   HEPARINUNFRC  --   < >  --   < > 0.35 <0.10* 0.31  --  0.27*  CREATININE  --   --  7.10*  --  4.10*  --   --   --   --   < > = values in this interval not displayed.  Estimated Creatinine Clearance: 18.2 ml/min (by C-G formula based on Cr of 4.1).   Medications:  Heparin 1700 units/hr  Assessment: 57 y/o M on heparin/warfarin overlap. HL 0.27. INR 1.38, trending up nicely. H/H stable. Plt wnl. Per RN, no bleeding noted and line is infusing properly.   Goal of Therapy:  Heparin level 0.3-0.7 units/ml Monitor platelets by anticoagulation protocol: Yes   Plan:  -Increase heparin drip at 1800 units/hr -8 hour HL at 2100 -Daily CBC/HL -Warfarin 7.5 mg PO x 1 dose tonight  -Daily PT/INR  -Monitor for bleeding  Vinnie Level, PharmD.  [place] License #[id number] Application for [place] reciprocity pending  Clinical Pharmacist Pager [phone]   Thuy D. Laney Potash, PharmD, BCPS Pager:  [phone] 02/28/2013, 1:15 PM  Addendum:  Heparin level = 0.63, at goal, currently on 1800 units/hr. No bleeding noted per  chart.  Plan: Continue heparin infusion at current rate F/u AM labs  Bayard Hugger, PharmD, BCPS  Clinical Pharmacist  Pager: [phone]

## 2013-03-01 LAB — CBC
HCT: 32.5 % — ABNORMAL LOW (ref 39.0–52.0)
Hemoglobin: 10.4 g/dL — ABNORMAL LOW (ref 13.0–17.0)
MCH: 30.6 pg (ref 26.0–34.0)
RBC: 3.4 MIL/uL — ABNORMAL LOW (ref 4.22–5.81)

## 2013-03-01 LAB — PROTIME-INR
INR: 1.93 — ABNORMAL HIGH (ref 0.00–1.49)
Prothrombin Time: 21.5 seconds — ABNORMAL HIGH (ref 11.6–15.2)

## 2013-03-01 LAB — GLUCOSE, CAPILLARY
Glucose-Capillary: 153 mg/dL — ABNORMAL HIGH (ref 70–99)
Glucose-Capillary: 47 mg/dL — ABNORMAL LOW (ref 70–99)
Glucose-Capillary: 318 mg/dL — ABNORMAL HIGH (ref 70–99)
Glucose-Capillary: 342 mg/dL — ABNORMAL HIGH (ref 70–99)

## 2013-03-01 LAB — HEPARIN LEVEL (UNFRACTIONATED): Heparin Unfractionated: 0.56 IU/mL (ref 0.30–0.70)

## 2013-03-01 MED ORDER — WARFARIN SODIUM 2.5 MG PO TABS
2.5000 mg | ORAL_TABLET | Freq: Once | ORAL | Status: AC
  Administered 2013-03-01: 2.5 mg via ORAL
  Filled 2013-03-01: qty 1

## 2013-03-01 MED ORDER — CALCIUM ACETATE (PHOS BINDER) 667 MG/5ML PO SOLN
667.0000 mg | Freq: Three times a day (TID) | ORAL | Status: DC
  Administered 2013-03-01 – 2013-03-11 (×22): 667 mg via ORAL
  Filled 2013-03-01 (×37): qty 5

## 2013-03-01 NOTE — Progress Notes (Signed)
[facility] PROGRESS NOTE  Darwyn Ponzo NWG:[id number] DOB: 06-23-1955 DOA: 02/25/2013 PCP: Galvin Proffer, MD  Brief narrative 57 y.o. male with h/o ESRD (HD MWF) who presents to [place] [facility] with worsening R arm swelling over the past week. His current HD access is in his L thigh and unrelated to this. Patient had a recent  attempt at thyroidectomy performed last month by Dr. Gerrit Friends. 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  In the ED at [facility] he was found to have not only RUE DVT but thrombus in his R IJ as well. The enlargement of the thyroid appears to be extending down into his thoracic cavity (unchanged since prior CT). 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  [facility].    Assessment/Plan:  Rt IJ thrombus  placed on heparin drip. Possible from direct compression of IJ by enlarged thyroid. Appreciate consultation by general surgery, vascular surgery and interventional radiology. On IV heparin drip. Recommend IV heparin and start on coumadin. Ligation of right AV graft done today CT reports unchanged airway compression. patient in no resp distress. does c/o some dysphagia which is new, awaiting followup by speech therapy for swallow evaluation  Thyroid mass  Thyroiditis per recent bx   ESRD on HD ( M,W,F)  renal following .has left thigh graft   Type 1 DM  Noted for low fsg in the morning. Will hold lantus and premeal aspart. Monitor on SSI only. Patient has poor po intake. Nutrition consulted.  HTN  continue home meds   Hx of CVA  continue ASA, statin   GERD  continue PPI   DIET: dys level 3   DVT prophylaxis: IV heparin , hold coumadin until procedure tomorrow.  Code Status: full code  Family Communication: Left  messages family Disposition Plan: pending    Consultants:  CCS  vascular surgery ( Dr Darrick Penna)  IR   Procedures:  For right AV graft ligation done 11/13  Antibiotics:  None  HPI/Subjective:  No complaints. Tolerating by mouth   Objective: Filed Vitals:   03/01/13 0500  BP: 161/69  Pulse: 58  Temp: 97.9 F (36.6 C)  Resp: 18    Intake/Output Summary (Last 24 hours) at 03/01/13 0817 Last data filed at 03/01/13 0700  Gross per 24 hour  Intake 1386.27 ml  Output   3000 ml  Net -1613.73 ml   Filed Weights   02/28/13 1412 02/28/13 1853 02/28/13 2037  Weight: 67.2 kg (148 lb 2.4 oz) 64 kg (141 lb 1.5 oz) 64 kg (141 lb 1.5 oz)    Exam: General: alert and oriented x2, no acute distress HEENT: no pallor, moist mucosa, enlarged thyroid  Chest: Labored breathing, but lungs clear auscultation bilaterally  CVS: Regular rate and rhythm, S1-S2 Abd: Soft, nontender, nondistended, positive bowel sounds  Ext: swollen rt arm and forearm , left groin AV graft    Data Reviewed: Basic Metabolic Panel:  Recent Labs Lab 02/25/13 0556 02/26/13 1326 02/27/13 0500 02/28/13 0840  NA 131* 128* 132* 127*  K 3.9 4.7 3.5 4.5  CL 90* 88* 93* 88*  CO2 25 24 26 24   GLUCOSE 281* 176* 36* 171*  BUN 24* 41* 18 29*  CREATININE 5.27* 7.10* 4.10* 5.38*  CALCIUM 8.6 8.6  8.6 8.8  PHOS  --  4.1  --  3.7   Liver Function Tests:  Recent Labs Lab 02/26/13 1326 02/28/13 0840  ALBUMIN 2.9* 3.1*   No results found for this basename: LIPASE, AMYLASE,  in the last 168 hours No results found for this basename: AMMONIA,  in the last 168 hours CBC:  Recent Labs Lab 02/25/13 0556 02/26/13 0400 02/27/13 0500 02/28/13 0655 03/01/13 0556  WBC 5.1 6.5 7.2 6.0 6.4  HGB 11.0* 10.4* 11.2* 10.5* 10.4*  HCT 35.0* 32.6* 35.3* 33.0* 32.5*  MCV 98.0 96.7 97.0 95.4 95.6  PLT 256 253 255 240 258   Cardiac Enzymes: No results found for this basename: CKTOTAL, CKMB, CKMBINDEX, TROPONINI,  in  the last 168 hours BNP (last 3 results) No results found for this basename: PROBNP,  in the last 8760 hours CBG:  Recent Labs Lab 02/27/13 1650 02/27/13 2032 02/28/13 0742 02/28/13 1131 02/28/13 2031  GLUCAP 270* 324* 162* 208* 153*    Recent Results (from the past 240 hour(s))  MRSA PCR SCREENING     Status: None   Collection Time    02/25/13  1:36 AM      Result Value Range Status   MRSA by PCR NEGATIVE  NEGATIVE Final   Comment:            The GeneXpert MRSA Assay (FDA     approved for NASAL specimens     only), is one component of a     comprehensive MRSA colonization     surveillance program. It is not     intended to diagnose MRSA     infection nor to guide or     monitor treatment for     MRSA infections.     Studies: No results found.  Scheduled Meds: . amLODipine  10 mg Oral QHS  . aspirin EC  81 mg Oral Daily  . calcium acetate  1,334 mg Oral TID WC  . cloNIDine  0.1 mg Oral TID  . darbepoetin (ARANESP) injection - DIALYSIS  100 mcg Intravenous Q Wed-HD  . docusate sodium  100 mg Oral BID  . feeding supplement (RESOURCE BREEZE)  1 Container Oral BID BM  . ferric gluconate (FERRLECIT/NULECIT) IV  62.5 mg Intravenous Weekly  . FLUoxetine  5 mg Oral Daily  . influenza vac split quadrivalent PF  0.5 mL Intramuscular Tomorrow-1000  . insulin aspart  0-9 Units Subcutaneous TID WC  . insulin detemir  5 Units Subcutaneous QHS  . levothyroxine  112 mcg Oral QAC breakfast  . metoprolol  75 mg Oral BID  . multivitamin  1 tablet Oral QHS  . pantoprazole  40 mg Oral Daily  . simvastatin  5 mg Oral q1800  . sodium chloride  3 mL Intravenous Q12H  . temazepam  15 mg Oral Custom  . Warfarin - Pharmacist Dosing Inpatient   Does not apply q1800   Continuous Infusions: . sodium chloride 20 mL/hr at 02/28/13 2205  . heparin 1,800 Units/hr (03/01/13 0029)      Time spent: 15 minutes    [name]  [facility] Pager [phone] If 7PM-7AM, please  contact night-coverage at [email or web address], password [facility] 03/01/2013, 8:17 AM  LOS: 4 days

## 2013-03-01 NOTE — Progress Notes (Signed)
I saw the patient and agree with the above assessment and plan.    Pt with some bleeding at surgical site, on warfarin and hep gtt.  HD going well.  Arm seems to be improving overall clinically.  Pt with good appetite, ate >50% of breakfast.

## 2013-03-01 NOTE — Progress Notes (Signed)
ANTICOAGULATION CONSULT NOTE - Follow Up Consult  Pharmacy Consult for Heparin/Coumadin (day #3/5 overlap) Indication: DVT (RUE) and right IJ thrombus  Allergies  Allergen Reactions  . Penicillins Other (See Comments)    Unknown reaction   Patient Measurements: Height: 6\' 2"  (188 cm) Weight: 141 lb 1.5 oz (64 kg) IBW/kg (Calculated) : 82.2 Heparin Dosing Weight: 62 kg Vital Signs: Temp: 97.9 F (36.6 C) (11/15 0500) Temp src: Oral (11/15 0500) BP: 161/69 mmHg (11/15 0500) Pulse Rate: 58 (11/15 0500) Labs:  Recent Labs  02/26/13 1326  02/27/13 0500  02/28/13 0655 02/28/13 0840 02/28/13 2049 03/01/13 0556  HGB  --   < > 11.2*  --  10.5*  --   --  10.4*  HCT  --   --  35.3*  --  33.0*  --   --  32.5*  PLT  --   --  255  --  240  --   --  258  LABPROT  --   --  15.8*  --  16.6*  --   --  21.5*  INR  --   --  1.29  --  1.38  --   --  1.93*  HEPARINUNFRC  --   < > 0.35  < >  --  0.27* 0.63 0.56  CREATININE 7.10*  --  4.10*  --   --  5.38*  --   --   < > = values in this interval not displayed.  Estimated Creatinine Clearance: 13.7 ml/min (by C-G formula based on Cr of 5.38).   Medications:  Heparin at 1800 units/hr Coumadin dosed daily  Assessment: 57 YOM on day # 3 of 5 day recommended overlap Heparin + Coumadin for DVT. INR is trending up at 1.93 (large jump from 1.38 yesterday after 2 doses of 7.5mg . Heparin level remains therapeutic at 0.56. CBC is wnl. No bleeding noted.   Goal of Therapy:  INR 2-3 Heparin level 0.3-0.7 units/ml Monitor platelets by anticoagulation protocol: Yes   Plan:  1. Coumadin 2.5mg  po x1 tonight - lower dose due to sharp rise in INR overnight.  2. Continue heparin at 1800 units/hr.  3. Daily INR, heparin level, and CBC.   Link Snuffer, PharmD, BCPS Clinical Pharmacist [phone] 03/01/2013,10:19 AM

## 2013-03-01 NOTE — Progress Notes (Signed)
Subjective:  Eating brk, arm discomfort improving daily Objective Vital signs in last 24 hours: Filed Vitals:   02/28/13 1830 02/28/13 1853 02/28/13 2037 03/01/13 0500  BP: 163/81 170/80 157/67 161/69  Pulse: 60 63 62 58  Temp:  98.1 F (36.7 C) 98.4 F (36.9 C) 97.9 F (36.6 C)  TempSrc:  Oral Oral Oral  Resp: 16 16 18 18   Height:      Weight:  64 kg (141 lb 1.5 oz) 64 kg (141 lb 1.5 oz)   SpO2:  96% 98% 94%   Weight change: 2.299 kg (5 lb 1.1 oz)  Intake/Output Summary (Last 24 hours) at 03/01/13 0828 Last data filed at 03/01/13 0700  Gross per 24 hour  Intake 1386.27 ml  Output   3000 ml  Net -1613.73 ml   Labs: Basic Metabolic Panel:  Recent Labs Lab 02/26/13 1326 02/27/13 0500 02/28/13 0840  NA 128* 132* 127*  K 4.7 3.5 4.5  CL 88* 93* 88*  CO2 24 26 24   GLUCOSE 176* 36* 171*  BUN 41* 18 29*  CREATININE 7.10* 4.10* 5.38*  CALCIUM 8.6 8.6 8.8  PHOS 4.1  --  3.7   Liver Function Tests:  Recent Labs Lab 02/26/13 1326 02/28/13 0840  ALBUMIN 2.9* 3.1*   No results found for this basename: LIPASE, AMYLASE,  in the last 168 hours No results found for this basename: AMMONIA,  in the last 168 hours CBC:  Recent Labs Lab 02/25/13 0556 02/26/13 0400 02/27/13 0500 02/28/13 0655 03/01/13 0556  WBC 5.1 6.5 7.2 6.0 6.4  HGB 11.0* 10.4* 11.2* 10.5* 10.4*  HCT 35.0* 32.6* 35.3* 33.0* 32.5*  MCV 98.0 96.7 97.0 95.4 95.6  PLT 256 253 255 240 258   Cardiac Enzymes: No results found for this basename: CKTOTAL, CKMB, CKMBINDEX, TROPONINI,  in the last 168 hours CBG:  Recent Labs Lab 02/27/13 1650 02/27/13 2032 02/28/13 0742 02/28/13 1131 02/28/13 2031  GLUCAP 270* 324* 162* 208* 153*    Iron Studies: No results found for this basename: IRON, TIBC, TRANSFERRIN, FERRITIN,  in the last 72 hours Studies/Results: No results found. Medications: . sodium chloride 20 mL/hr at 02/28/13 2205  . heparin 1,800 Units/hr (03/01/13 0029)   . amLODipine  10 mg  Oral QHS  . aspirin EC  81 mg Oral Daily  . calcium acetate  1,334 mg Oral TID WC  . cloNIDine  0.1 mg Oral TID  . darbepoetin (ARANESP) injection - DIALYSIS  100 mcg Intravenous Q Wed-HD  . docusate sodium  100 mg Oral BID  . feeding supplement (RESOURCE BREEZE)  1 Container Oral BID BM  . ferric gluconate (FERRLECIT/NULECIT) IV  62.5 mg Intravenous Weekly  . FLUoxetine  5 mg Oral Daily  . influenza vac split quadrivalent PF  0.5 mL Intramuscular Tomorrow-1000  . insulin aspart  0-9 Units Subcutaneous TID WC  . insulin detemir  5 Units Subcutaneous QHS  . levothyroxine  112 mcg Oral QAC breakfast  . metoprolol  75 mg Oral BID  . multivitamin  1 tablet Oral QHS  . pantoprazole  40 mg Oral Daily  . simvastatin  5 mg Oral q1800  . sodium chloride  3 mL Intravenous Q12H  . temazepam  15 mg Oral Custom  . Warfarin - Pharmacist Dosing Inpatient   Does not apply q1800    Physical Exam: General: Thin BM in NAD  Heart: RRR . No rub or mur  Lungs: CTA  bilaterally Abdomen: BS Pos. Soft ,nontender  Extremities: No pedal edema/ R Upper extremity Edema  improving   Dialysis Access: Pos bruit L FEM AVGG/ R Fore Arm AVGG pos bruit and swelling of area.   Dialysis Orders: Center: [facility] on [facility] .  EDW 61 kg HD Bath 2.0 k Time 2.25 Heparin 2400. Access  l fem AVGG BFR 450 DFR af 1.5  Hectorol 0 (pth 56) mcg IV/HD Epogen 6800 Units IV/HD Venofer 50mg  per week   Assessment/Plan  1. R IJ DVT/ andThrombus Axillary and Brachiocephalic Veins= IV heparin IR seeing/ sp VVS  Ligation of R FA AVGG 02/27/13 2. ESRD - HD MWF ( [facility]) on schedule / using L Fem avgg 3. Hypertension/volume - WT  Up 3 kg and tolerated 3 l uf yesterday  Challenge more uf next hd Monday / on Amlodipine 10mg  hs, Clonidine.1mg  tid Hold am hd, lopressor 50mg  hs/ need standing wts 4. Anemia - HGB 10.4 stable continue Aranesp100 q wed hd/ venofer  5. Metabolic bone disease - No hectorol / binder=2 calcium acetate / last phos 3.7.  Corrected Ca=9.5 decr. To 1 with trending down phos. And wilol change to Phos lyra with swallowing difficulty 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 Nutrition - renal . Carb Mod / renavite/ Dysphagia  8. Type 1 dm - per adm   Lenny Pastel, PA-C [facility] Beeper [phone] 03/01/2013,8:28 AM  LOS: 4 days

## 2013-03-02 LAB — BASIC METABOLIC PANEL
BUN: 29 mg/dL — ABNORMAL HIGH (ref 6–23)
CO2: 22 mEq/L (ref 19–32)
Chloride: 93 mEq/L — ABNORMAL LOW (ref 96–112)
Creatinine, Ser: 4.9 mg/dL — ABNORMAL HIGH (ref 0.50–1.35)
GFR calc non Af Amer: 12 mL/min — ABNORMAL LOW (ref >90)
Sodium: 128 mEq/L — ABNORMAL LOW (ref 135–145)

## 2013-03-02 LAB — CBC
HCT: 33.8 % — ABNORMAL LOW (ref 39.0–52.0)
MCHC: 32.2 g/dL (ref 30.0–36.0)
MCV: 94.9 fL (ref 78.0–100.0)
RDW: 15.3 % (ref 11.5–15.5)

## 2013-03-02 LAB — GLUCOSE, CAPILLARY
Glucose-Capillary: 28 mg/dL — CL (ref 70–99)
Glucose-Capillary: 73 mg/dL (ref 70–99)
Glucose-Capillary: 164 mg/dL — ABNORMAL HIGH (ref 70–99)
Glucose-Capillary: 250 mg/dL — ABNORMAL HIGH (ref 70–99)
Glucose-Capillary: 112 mg/dL — ABNORMAL HIGH (ref 70–99)

## 2013-03-02 MED ORDER — DEXTROSE 50 % IV SOLN: 1.0000 | Freq: Once | INTRAVENOUS | Status: AC | PRN

## 2013-03-02 MED ORDER — WARFARIN SODIUM 4 MG PO TABS
4.0000 mg | ORAL_TABLET | Freq: Once | ORAL | Status: AC
  Administered 2013-03-02: 4 mg via ORAL
  Filled 2013-03-02: qty 1

## 2013-03-02 MED ORDER — DEXTROSE 50 % IV SOLN
INTRAVENOUS | Status: AC
  Administered 2013-03-02: 25 mL
  Filled 2013-03-02: qty 50

## 2013-03-02 NOTE — Progress Notes (Signed)
I saw the patient and agree with the above assessment and plan.    INR 2.3.  Eating well. Still tender at RUA.

## 2013-03-02 NOTE — Progress Notes (Signed)
Subjective:  Eating  brk , no swallowing difficulty this am,  Objective Vital signs in last 24 hours: Filed Vitals:   03/01/13 1400 03/01/13 1817 03/01/13 2131 03/02/13 0537  BP: 147/64 155/60 125/54 157/80  Pulse: 70 78 60 63  Temp: 98 F (36.7 C) 98.7 F (37.1 C) 98.1 F (36.7 C) 98.3 F (36.8 C)  TempSrc: Oral Oral Oral Oral  Resp: 18 18 18 18   Height:      Weight:   64 kg (141 lb 1.5 oz)   SpO2: 96% 96% 100% 100%   Weight change: -3.2 kg (-7 lb 0.9 oz)  Intake/Output Summary (Last 24 hours) at 03/02/13 0825 Last data filed at 03/02/13 0539  Gross per 24 hour  Intake   2080 ml  Output      0 ml  Net   2080 ml   Labs: Basic Metabolic Panel:  Recent Labs Lab 02/26/13 1326 02/27/13 0500 02/28/13 0840  NA 128* 132* 127*  K 4.7 3.5 4.5  CL 88* 93* 88*  CO2 24 26 24   GLUCOSE 176* 36* 171*  BUN 41* 18 29*  CREATININE 7.10* 4.10* 5.38*  CALCIUM 8.6 8.6 8.8  PHOS 4.1  --  3.7   Liver Function Tests:  Recent Labs Lab 02/26/13 1326 02/28/13 0840  ALBUMIN 2.9* 3.1*    CBC:  Recent Labs Lab 02/25/13 0556 02/26/13 0400 02/27/13 0500 02/28/13 0655 03/01/13 0556  WBC 5.1 6.5 7.2 6.0 6.4  HGB 11.0* 10.4* 11.2* 10.5* 10.4*  HCT 35.0* 32.6* 35.3* 33.0* 32.5*  MCV 98.0 96.7 97.0 95.4 95.6  PLT 256 253 255 240 258   CBG:  Recent Labs Lab 03/01/13 1412 03/01/13 1714 03/01/13 2130 03/02/13 0727 03/02/13 0748  GLUCAP 416* 342* 141* 28* 73   Medications: . sodium chloride 20 mL/hr at 02/28/13 2205  . heparin 1,800 Units/hr (03/02/13 0544)   . amLODipine  10 mg Oral QHS  . aspirin EC  81 mg Oral Daily  . calcium acetate (Phos Binder)  667 mg Oral TID WC  . cloNIDine  0.1 mg Oral TID  . darbepoetin (ARANESP) injection - DIALYSIS  100 mcg Intravenous Q Wed-HD  . docusate sodium  100 mg Oral BID  . feeding supplement (RESOURCE BREEZE)  1 Container Oral BID BM  . ferric gluconate (FERRLECIT/NULECIT) IV  62.5 mg Intravenous Weekly  . FLUoxetine  5 mg  Oral Daily  . influenza vac split quadrivalent PF  0.5 mL Intramuscular Tomorrow-1000  . insulin aspart  0-9 Units Subcutaneous TID WC  . insulin detemir  5 Units Subcutaneous QHS  . levothyroxine  112 mcg Oral QAC breakfast  . metoprolol  75 mg Oral BID  . multivitamin  1 tablet Oral QHS  . pantoprazole  40 mg Oral Daily  . simvastatin  5 mg Oral q1800  . sodium chloride  3 mL Intravenous Q12H  . temazepam  15 mg Oral Custom  . Warfarin - Pharmacist Dosing Inpatient   Does not apply q1800    Physical Exam: General: Thin BM in NAD  Heart: RRR . No rub or mur  Lungs: CTA bilaterally , no wheezing , rhonchi or rales Abdomen: BS Pos. Soft ,nontender  Extremities: No pedal edema/ R Upper extremity Edema improving  Dialysis Access: Pos bruit L FEM AVGG/ R Fore Arm AVGG pos bruit and swelling of area.   Dialysis Orders: Center: [facility] .  EDW 61 kg HD Bath 2.0 k  Ca2.25, Time 4hr  Heparin 2400. Access  l fem AVGG BFR 450 DFR af 1.5  Hectorol 0 (pth 56) mcg IV/HD Epogen 6800 Units IV/HD Venofer 50mg  per week   Assessment/Plan  1. R IJ DVT/ andThrombus Axillary and Brachiocephalic Veins= IV heparin and po coumadin with inr this am pending I sp VVS Ligation of R FA AVGG 02/27/13/ LIVES AT HOME will need caregiver= Sister and her friend educated about COUMADIN  And INR fu per op PCP??/ walks at home with walker  2. ESRD - HD MWF ( [facility]) on schedule / using L Fem avgg 3. Hypertension/volume - bp = 157/80/ WT Up 3 kg and tolerated 3 l uf Friday hd / 3 to 3.5l  uf next hd in am / on Amlodipine 10mg  hs, Clonidine.1mg  tid Hold am hd, lopressor 50mg  hs/ need standing wts 4. Anemia - HGB 10.4 stable continue Aranesp100 q wed hd/ venofer  5. Metabolic bone disease - No hectorol / binder=2 calcium acetate / last phos 3.7. Corrected Ca=9.5 decr. To 1 with trending down phos. And will  change to Phos lyra with swallowing difficulty 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 Nutrition -  renal . Carb Mod / renavite/ Dysphagia  8. Type 1 dm - per adm  Lenny Pastel, PA-C [facility] Beeper [phone] 03/02/2013,8:25 AM  LOS: 5 days

## 2013-03-02 NOTE — Progress Notes (Signed)
[facility] PROGRESS NOTE  Marcus [name] WJX:[id number] DOB: [birth year] DOA: 02/25/2013 PCP: Galvin Proffer, MD  Brief narrative 57 y.o. male with h/o ESRD (HD MWF) who presents to [place] [facility] with worsening R arm swelling over the past week. His current HD access is in his L thigh and unrelated to this. Patient had a recent  attempt at thyroidectomy performed last month by Dr. Gerrit Friends. 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  In the ED at [facility] he was found to have not only RUE DVT but thrombus in his R IJ as well. The enlargement of the thyroid appears to be extending down into his thoracic cavity (unchanged since prior CT). 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  [facility] was thus [facility] for [facility].   Assessment/Plan:  Rt IJ thrombus  placed on heparin drip. Possible from direct compression of IJ by enlarged thyroid. Appreciate consultation by general surgery, vascular surgery and interventional radiology. On IV heparin drip. Recommend IV heparin and start on coumadin. Ligation of right AV graft done last week CT reports unchanged airway compression. patient in no resp distress. does c/o some new dysphasia which ceased therapy has seen and put on dysphagia 3 diet  Thyroid mass  Thyroiditis per recent bx   ESRD on HD ( M,W,F)  renal following .has left thigh graft   Type 1 DM  Symptomatic hypoglycemia today. Have stopped Levemir  HTN  continue home meds   Hx of CVA  continue ASA, statin   GERD  continue PPI   DIET: dys level 3   DVT prophylaxis: IV heparin , hold coumadin until procedure tomorrow.  Code Status: full code  Family Communication: Spoke with sister today by phone Disposition Plan: pending    Consultants:  CCS  vascular surgery ( Dr  Darrick Penna)  IR   Procedures:  For right AV graft ligation done 11/13  Antibiotics:  None  HPI/Subjective:  No complaints. Tolerating by mouth   Objective: Filed Vitals:   03/02/13 0537  BP: 157/80  Pulse: 63  Temp: 98.3 F (36.8 C)  Resp: 18    Intake/Output Summary (Last 24 hours) at 03/02/13 0744 Last data filed at 03/02/13 0539  Gross per 24 hour  Intake   2200 ml  Output      0 ml  Net   2200 ml   Filed Weights   02/28/13 1853 02/28/13 2037 03/01/13 2131  Weight: 64 kg (141 lb 1.5 oz) 64 kg (141 lb 1.5 oz) 64 kg (141 lb 1.5 oz)    Exam: General: alert and oriented x2, no acute distress HEENT: no pallor, moist mucosa, enlarged thyroid  Chest: Labored breathing, but lungs clear auscultation bilaterally  CVS: Regular rate and rhythm, S1-S2 Abd: Soft, nontender, nondistended, positive bowel sounds  Ext: swollen rt arm and forearm , left groin AV graft    Data Reviewed: Basic Metabolic Panel:  Recent Labs Lab 02/25/13 0556 02/26/13 1326 02/27/13 0500 02/28/13 0840  NA 131* 128* 132* 127*  K 3.9 4.7 3.5 4.5  CL 90* 88* 93* 88*  CO2 25 24 26 24   GLUCOSE 281* 176* 36* 171*  BUN 24* 41* 18 29*  CREATININE 5.27* 7.10* 4.10* 5.38*  CALCIUM 8.6 8.6 8.6 8.8  PHOS  --  4.1  --  3.7   Liver Function Tests:  Recent Labs Lab 02/26/13 1326 02/28/13 0840  ALBUMIN 2.9* 3.1*   No results found for this basename: LIPASE, AMYLASE,  in the last 168 hours No results found for this basename: AMMONIA,  in the last 168 hours CBC:  Recent Labs Lab 02/25/13 0556 02/26/13 0400 02/27/13 0500 02/28/13 0655 03/01/13 0556  WBC 5.1 6.5 7.2 6.0 6.4  HGB 11.0* 10.4* 11.2* 10.5* 10.4*  HCT 35.0* 32.6* 35.3* 33.0* 32.5*  MCV 98.0 96.7 97.0 95.4 95.6  PLT 256 253 255 240 258   Cardiac Enzymes: No results found for this basename: CKTOTAL, CKMB, CKMBINDEX, TROPONINI,  in the last 168 hours BNP (last 3 results) No results found for this basename: PROBNP,  in the last  8760 hours CBG:  Recent Labs Lab 03/01/13 1212 03/01/13 1412 03/01/13 1714 03/01/13 2130 03/02/13 0727  GLUCAP 318* 416* 342* 141* 28*    Recent Results (from the past 240 hour(s))  MRSA PCR SCREENING     Status: None   Collection Time    02/25/13  1:36 AM      Result Value Range Status   MRSA by PCR NEGATIVE  NEGATIVE Final   Comment:            The GeneXpert MRSA Assay (FDA     approved for NASAL specimens     only), is one component of a     comprehensive MRSA colonization     surveillance program. It is not     intended to diagnose MRSA     infection nor to guide or     monitor treatment for     MRSA infections.     Studies: No results found.  Scheduled Meds: . amLODipine  10 mg Oral QHS  . aspirin EC  81 mg Oral Daily  . calcium acetate (Phos Binder)  667 mg Oral TID WC  . cloNIDine  0.1 mg Oral TID  . darbepoetin (ARANESP) injection - DIALYSIS  100 mcg Intravenous Q Wed-HD  . dextrose      . docusate sodium  100 mg Oral BID  . feeding supplement (RESOURCE BREEZE)  1 Container Oral BID BM  . ferric gluconate (FERRLECIT/NULECIT) IV  62.5 mg Intravenous Weekly  . FLUoxetine  5 mg Oral Daily  . influenza vac split quadrivalent PF  0.5 mL Intramuscular Tomorrow-1000  . insulin aspart  0-9 Units Subcutaneous TID WC  . insulin detemir  5 Units Subcutaneous QHS  . levothyroxine  112 mcg Oral QAC breakfast  . metoprolol  75 mg Oral BID  . multivitamin  1 tablet Oral QHS  . pantoprazole  40 mg Oral Daily  . simvastatin  5 mg Oral q1800  . sodium chloride  3 mL Intravenous Q12H  . temazepam  15 mg Oral Custom  . Warfarin - Pharmacist Dosing Inpatient   Does not apply q1800   Continuous Infusions: . sodium chloride 20 mL/hr at 02/28/13 2205  . heparin 1,800 Units/hr (03/02/13 0544)      Time spent: 20 minutes    [name]  [facility] Pager [phone] If 7PM-7AM, please contact night-coverage at [email or web address], password [facility] 03/02/2013,  7:44 AM  LOS: 5 days

## 2013-03-02 NOTE — Progress Notes (Signed)
ANTICOAGULATION CONSULT NOTE - Follow Up Consult  Pharmacy Consult for Heparin/Coumadin (day #4/5 overlap) Indication: DVT (RUE) and right IJ thrombus  Allergies  Allergen Reactions  . Penicillins Other (See Comments)    Unknown reaction   Patient Measurements: Height: 6\' 2"  (188 cm) Weight: 141 lb 1.5 oz (64 kg) IBW/kg (Calculated) : 82.2 Heparin Dosing Weight: 62 kg Vital Signs: Temp: 97.5 F (36.4 C) (11/16 0924) Temp src: Oral (11/16 0537) BP: 156/59 mmHg (11/16 0924) Pulse Rate: 63 (11/16 0924) Labs:  Recent Labs  02/28/13 0655 02/28/13 0840 02/28/13 2049 03/01/13 0556 03/02/13 0835  HGB 10.5*  --   --  10.4* 10.9*  HCT 33.0*  --   --  32.5* 33.8*  PLT 240  --   --  258 250  LABPROT 16.6*  --   --  21.5* 24.8*  INR 1.38  --   --  1.93* 2.33*  HEPARINUNFRC  --  0.27* 0.63 0.56 0.51  CREATININE  --  5.38*  --   --  4.90*    Estimated Creatinine Clearance: 15.1 ml/min (by C-G formula based on Cr of 4.9).   Medications:  Heparin at 1800 units/hr Coumadin dosed daily  Assessment: 57 YOM on day # 4 of 5 day recommended overlap Heparin + Coumadin for DVT (should continue until INR >2 for 2 days).   INR is therapeutic at 2.33 (large increases with 7.5mg  dosing so utilizing lower dosing currently).   Heparin level remains therapeutic at 0.51.   H/H/plts are stable. No bleeding noted.   Goal of Therapy:  INR 2-3 Heparin level 0.3-0.7 units/ml Monitor platelets by anticoagulation protocol: Yes   Plan:  1. Coumadin 4mg  po x1 tonight - lower dose due to sharp rise in INR on higher dosing.  2. Continue heparin at 1800 units/hr.  3. Daily INR, heparin level, and CBC.   Link Snuffer, PharmD, BCPS Clinical Pharmacist [phone] 03/02/2013,10:35 AM

## 2013-03-03 ENCOUNTER — Telehealth: Payer: Self-pay | Admitting: Vascular Surgery

## 2013-03-03 ENCOUNTER — Inpatient Hospital Stay (HOSPITAL_COMMUNITY): Payer: Medicare Other | Admitting: Anesthesiology

## 2013-03-03 ENCOUNTER — Encounter (HOSPITAL_COMMUNITY): Payer: Medicare Other | Admitting: Anesthesiology

## 2013-03-03 ENCOUNTER — Inpatient Hospital Stay (HOSPITAL_COMMUNITY): Payer: Medicare Other

## 2013-03-03 ENCOUNTER — Encounter (HOSPITAL_COMMUNITY): Payer: Self-pay | Admitting: Vascular Surgery

## 2013-03-03 ENCOUNTER — Encounter (HOSPITAL_COMMUNITY)
Admission: AD | Disposition: A | Discharge status: Home / Self Care | Payer: Self-pay | Source: Other Acute Inpatient Hospital | Attending: Internal Medicine

## 2013-03-03 DIAGNOSIS — T82898A Other specified complication of vascular prosthetic devices, implants and grafts, initial encounter: Secondary | ICD-10-CM

## 2013-03-03 DIAGNOSIS — I82A19 Acute embolism and thrombosis of unspecified axillary vein: Secondary | ICD-10-CM

## 2013-03-03 HISTORY — PX: LIGATION ARTERIOVENOUS GORTEX GRAFT: SHX5947

## 2013-03-03 HISTORY — PX: INSERTION OF DIALYSIS CATHETER: SHX1324

## 2013-03-03 LAB — BLOOD GAS, ARTERIAL
Drawn by: 25203
MECHVT: 650 mL
PEEP: 5 cmH2O
RATE: 16 resp/min
pCO2 arterial: 31.2 mmHg — ABNORMAL LOW (ref 35.0–45.0)
pH, Arterial: 7.495 — ABNORMAL HIGH (ref 7.350–7.450)
pO2, Arterial: 226 mmHg — ABNORMAL HIGH (ref 80.0–100.0)

## 2013-03-03 LAB — GLUCOSE, CAPILLARY
Glucose-Capillary: 149 mg/dL — ABNORMAL HIGH (ref 70–99)
Glucose-Capillary: 146 mg/dL — ABNORMAL HIGH (ref 70–99)
Glucose-Capillary: 148 mg/dL — ABNORMAL HIGH (ref 70–99)
Glucose-Capillary: 158 mg/dL — ABNORMAL HIGH (ref 70–99)
Glucose-Capillary: 106 mg/dL — ABNORMAL HIGH (ref 70–99)

## 2013-03-03 LAB — CBC
HCT: 23.2 % — ABNORMAL LOW (ref 39.0–52.0)
Hemoglobin: 7.6 g/dL — ABNORMAL LOW (ref 13.0–17.0)
MCH: 32 pg (ref 26.0–34.0)
MCH: 30 pg (ref 26.0–34.0)
MCHC: 32.5 g/dL (ref 30.0–36.0)
MCV: 92.8 fL (ref 78.0–100.0)
MCV: 92.5 fL (ref 78.0–100.0)
Platelets: 178 10*3/uL (ref 150–400)
RBC: 2.5 MIL/uL — ABNORMAL LOW (ref 4.22–5.81)
RDW: 16.2 % — ABNORMAL HIGH (ref 11.5–15.5)
WBC: 8.1 10*3/uL (ref 4.0–10.5)

## 2013-03-03 LAB — BASIC METABOLIC PANEL
BUN: 39 mg/dL — ABNORMAL HIGH (ref 6–23)
CO2: 24 mEq/L (ref 19–32)
Calcium: 8.5 mg/dL (ref 8.4–10.5)
Creatinine, Ser: 6.02 mg/dL — ABNORMAL HIGH (ref 0.50–1.35)
GFR calc non Af Amer: 9 mL/min — ABNORMAL LOW (ref >90)
Glucose, Bld: 125 mg/dL — ABNORMAL HIGH (ref 70–99)
Potassium: 4.5 mEq/L (ref 3.5–5.1)
Sodium: 129 mEq/L — ABNORMAL LOW (ref 135–145)

## 2013-03-03 LAB — RENAL FUNCTION PANEL
Albumin: 2.6 g/dL — ABNORMAL LOW (ref 3.5–5.2)
BUN: 42 mg/dL — ABNORMAL HIGH (ref 6–23)
Glucose, Bld: 169 mg/dL — ABNORMAL HIGH (ref 70–99)
Phosphorus: 3.9 mg/dL (ref 2.3–4.6)
Potassium: 5 mEq/L (ref 3.5–5.1)

## 2013-03-03 LAB — HEMOGLOBIN AND HEMATOCRIT, BLOOD: HCT: 24.1 % — ABNORMAL LOW (ref 39.0–52.0)

## 2013-03-03 LAB — PROTIME-INR: INR: 1.96 — ABNORMAL HIGH (ref 0.00–1.49)

## 2013-03-03 LAB — APTT: aPTT: 109 seconds — ABNORMAL HIGH (ref 24–37)

## 2013-03-03 LAB — TRIGLYCERIDES: Triglycerides: 437 mg/dL — ABNORMAL HIGH (ref <150)

## 2013-03-03 LAB — PREPARE RBC (CROSSMATCH)

## 2013-03-03 SURGERY — LIGATION ARTERIOVENOUS GORTEX GRAFT
Anesthesia: General | Site: Leg Upper | Laterality: Right | Wound class: Clean

## 2013-03-03 MED ORDER — PROPOFOL 10 MG/ML IV EMUL
0.0000 ug/kg/min | INTRAVENOUS | Status: DC
  Administered 2013-03-03: 10 ug/kg/min via INTRAVENOUS

## 2013-03-03 MED ORDER — SODIUM CHLORIDE 0.9 % IV SOLN
INTRAVENOUS | Status: DC | PRN
  Administered 2013-03-03: 01:00:00 via INTRAVENOUS

## 2013-03-03 MED ORDER — MIDAZOLAM HCL 2 MG/2ML IJ SOLN: 2.0000 mg | INTRAMUSCULAR | Status: DC | PRN

## 2013-03-03 MED ORDER — LIDOCAINE HCL (CARDIAC) 20 MG/ML IV SOLN
INTRAVENOUS | Status: DC | PRN
  Administered 2013-03-03: 70 mg via INTRAVENOUS

## 2013-03-03 MED ORDER — THROMBIN 20000 UNITS EX SOLR
CUTANEOUS | Status: AC
  Filled 2013-03-03: qty 20000

## 2013-03-03 MED ORDER — HEPARIN SODIUM (PORCINE) 1000 UNIT/ML IJ SOLN
INTRAMUSCULAR | Status: DC | PRN
  Administered 2013-03-03: 7 mL

## 2013-03-03 MED ORDER — VANCOMYCIN HCL 1000 MG IV SOLR
1000.0000 mg | INTRAVENOUS | Status: DC | PRN
  Administered 2013-03-03: 1000 mg via INTRAVENOUS

## 2013-03-03 MED ORDER — 0.9 % SODIUM CHLORIDE (POUR BTL) OPTIME
TOPICAL | Status: DC | PRN
  Administered 2013-03-03: 1000 mL

## 2013-03-03 MED ORDER — PROPOFOL 10 MG/ML IV BOLUS
INTRAVENOUS | Status: DC | PRN
  Administered 2013-03-03: 120 mg via INTRAVENOUS
  Administered 2013-03-03: 50 mg via INTRAVENOUS

## 2013-03-03 MED ORDER — CHLORHEXIDINE GLUCONATE 0.12 % MT SOLN: 15.0000 mL | Freq: Two times a day (BID) | OROMUCOSAL | Status: DC

## 2013-03-03 MED ORDER — MIDAZOLAM HCL 5 MG/5ML IJ SOLN
INTRAMUSCULAR | Status: DC | PRN
  Administered 2013-03-03: 2 mg via INTRAVENOUS

## 2013-03-03 MED ORDER — BIOTENE DRY MOUTH MT LIQD: 15.0000 mL | Freq: Four times a day (QID) | OROMUCOSAL | Status: DC

## 2013-03-03 MED ORDER — VANCOMYCIN HCL IN DEXTROSE 1-5 GM/200ML-% IV SOLN
INTRAVENOUS | Status: AC
  Filled 2013-03-03: qty 200

## 2013-03-03 MED ORDER — DEXTROSE-NACL 5-0.45 % IV SOLN
INTRAVENOUS | Status: DC
  Administered 2013-03-03: 05:00:00 via INTRAVENOUS

## 2013-03-03 MED ORDER — PANTOPRAZOLE SODIUM 40 MG IV SOLR
40.0000 mg | Freq: Every day | INTRAVENOUS | Status: DC
  Administered 2013-03-03 – 2013-03-04 (×2): 40 mg via INTRAVENOUS
  Filled 2013-03-03 (×2): qty 40

## 2013-03-03 MED ORDER — PROPOFOL 10 MG/ML IV EMUL
INTRAVENOUS | Status: AC
  Filled 2013-03-03: qty 100

## 2013-03-03 MED ORDER — INSULIN ASPART 100 UNIT/ML ~~LOC~~ SOLN
0.0000 [IU] | SUBCUTANEOUS | Status: DC
  Administered 2013-03-03: 1 [IU] via SUBCUTANEOUS
  Administered 2013-03-03: 2 [IU] via SUBCUTANEOUS

## 2013-03-03 MED ORDER — FENTANYL CITRATE 0.05 MG/ML IJ SOLN
100.0000 ug | INTRAMUSCULAR | Status: DC | PRN
  Administered 2013-03-03: 100 ug via INTRAVENOUS
  Filled 2013-03-03: qty 2

## 2013-03-03 MED ORDER — INSULIN ASPART 100 UNIT/ML ~~LOC~~ SOLN
1.0000 [IU] | Freq: Three times a day (TID) | SUBCUTANEOUS | Status: DC
  Administered 2013-03-04: 2 [IU] via SUBCUTANEOUS
  Administered 2013-03-04: 1 [IU] via SUBCUTANEOUS
  Administered 2013-03-04 – 2013-03-05 (×2): 2 [IU] via SUBCUTANEOUS
  Administered 2013-03-05: 4 [IU] via SUBCUTANEOUS
  Administered 2013-03-06: 5 [IU] via SUBCUTANEOUS

## 2013-03-03 MED ORDER — CHLORHEXIDINE GLUCONATE 0.12 % MT SOLN
15.0000 mL | Freq: Two times a day (BID) | OROMUCOSAL | Status: DC
  Administered 2013-03-03 (×2): 15 mL via OROMUCOSAL
  Filled 2013-03-03 (×3): qty 15

## 2013-03-03 MED ORDER — SODIUM CHLORIDE 0.9 % IR SOLN
Status: DC | PRN
  Administered 2013-03-03: 02:00:00

## 2013-03-03 MED ORDER — SUCCINYLCHOLINE CHLORIDE 20 MG/ML IJ SOLN
INTRAMUSCULAR | Status: DC | PRN
  Administered 2013-03-03: 120 mg via INTRAVENOUS

## 2013-03-03 MED ORDER — BIOTENE DRY MOUTH MT LIQD
15.0000 mL | Freq: Four times a day (QID) | OROMUCOSAL | Status: DC
  Administered 2013-03-03 – 2013-03-08 (×12): 15 mL via OROMUCOSAL

## 2013-03-03 MED ORDER — FENTANYL CITRATE 0.05 MG/ML IJ SOLN
INTRAMUSCULAR | Status: DC | PRN
  Administered 2013-03-03: 50 ug via INTRAVENOUS
  Administered 2013-03-03 (×2): 100 ug via INTRAVENOUS

## 2013-03-03 MED ORDER — GLUCAGON HCL (RDNA) 1 MG IJ SOLR
INTRAMUSCULAR | Status: AC
  Administered 2013-03-03: 1 mg
  Filled 2013-03-03: qty 1

## 2013-03-03 MED ORDER — "THROMBI-PAD 3""X3"" EX PADS"
1.0000 | MEDICATED_PAD | Freq: Once | CUTANEOUS | Status: DC
  Filled 2013-03-03: qty 1

## 2013-03-03 MED ORDER — HEPARIN SODIUM (PORCINE) 1000 UNIT/ML IJ SOLN
INTRAMUSCULAR | Status: AC
  Filled 2013-03-03: qty 1

## 2013-03-03 SURGICAL SUPPLY — 46 items
ADH SKN CLS APL DERMABOND .7 (GAUZE/BANDAGES/DRESSINGS) ×4
CANISTER SUCTION 2500CC (MISCELLANEOUS) ×3 IMPLANT
CATH CANNON HEMO 15F 50CM (CATHETERS) ×1 IMPLANT
COVER PROBE W GEL 5X96 (DRAPES) ×1 IMPLANT
COVER SURGICAL LIGHT HANDLE (MISCELLANEOUS) ×3 IMPLANT
DERMABOND ADVANCED (GAUZE/BANDAGES/DRESSINGS) ×2
DERMABOND ADVANCED .7 DNX12 (GAUZE/BANDAGES/DRESSINGS) ×2 IMPLANT
ELECT REM PT RETURN 9FT ADLT (ELECTROSURGICAL) ×3
ELECTRODE REM PT RTRN 9FT ADLT (ELECTROSURGICAL) ×2 IMPLANT
GAUZE SPONGE 2X2 8PLY STRL LF (GAUZE/BANDAGES/DRESSINGS) IMPLANT
GEL ULTRASOUND 20GR AQUASONIC (MISCELLANEOUS) IMPLANT
GLOVE BIO SURGEON STRL SZ 6.5 (GLOVE) ×1 IMPLANT
GLOVE BIO SURGEON STRL SZ7 (GLOVE) ×5 IMPLANT
GLOVE BIOGEL PI IND STRL 6.5 (GLOVE) IMPLANT
GLOVE BIOGEL PI IND STRL 7.5 (GLOVE) ×2 IMPLANT
GLOVE BIOGEL PI INDICATOR 6.5 (GLOVE) ×1
GLOVE BIOGEL PI INDICATOR 7.5 (GLOVE) ×4
GLOVE SURG SS PI 7.5 STRL IVOR (GLOVE) ×2 IMPLANT
GOWN STRL NON-REIN LRG LVL3 (GOWN DISPOSABLE) ×11 IMPLANT
KIT BASIN OR (CUSTOM PROCEDURE TRAY) ×3 IMPLANT
KIT ROOM TURNOVER OR (KITS) ×3 IMPLANT
NS IRRIG 1000ML POUR BTL (IV SOLUTION) ×3 IMPLANT
PACK CV ACCESS (CUSTOM PROCEDURE TRAY) ×3 IMPLANT
PAD ARMBOARD 7.5X6 YLW CONV (MISCELLANEOUS) ×6 IMPLANT
SET MICROPUNCTURE 5F STIFF (MISCELLANEOUS) ×1 IMPLANT
SPONGE GAUZE 2X2 STER 10/PKG (GAUZE/BANDAGES/DRESSINGS) ×1
SPONGE GAUZE 4X4 12PLY (GAUZE/BANDAGES/DRESSINGS) ×3 IMPLANT
SPONGE SURGIFOAM ABS GEL 100 (HEMOSTASIS) IMPLANT
SUT ETHILON 3 0 PS 1 (SUTURE) ×1 IMPLANT
SUT MNCRL AB 4-0 PS2 18 (SUTURE) ×6 IMPLANT
SUT PROLENE 3 0 SH DA (SUTURE) ×1 IMPLANT
SUT PROLENE 5 0 C 1 36 (SUTURE) ×1 IMPLANT
SUT PROLENE 6 0 BV (SUTURE) IMPLANT
SUT SILK 0 TIES 10X30 (SUTURE) ×2 IMPLANT
SUT SILK 2 0 SH (SUTURE) ×1 IMPLANT
SUT VIC AB 3-0 SH 27 (SUTURE) ×6
SUT VIC AB 3-0 SH 27X BRD (SUTURE) ×2 IMPLANT
SWAB COLLECTION DEVICE MRSA (MISCELLANEOUS) ×1 IMPLANT
SYR 3ML LL SCALE MARK (SYRINGE) ×1 IMPLANT
SYR 5ML LL (SYRINGE) ×1 IMPLANT
TAPE CLOTH SURG 4X10 WHT LF (GAUZE/BANDAGES/DRESSINGS) ×1 IMPLANT
TOWEL OR 17X24 6PK STRL BLUE (TOWEL DISPOSABLE) ×3 IMPLANT
TOWEL OR 17X26 10 PK STRL BLUE (TOWEL DISPOSABLE) ×3 IMPLANT
TUBE ANAEROBIC SPECIMEN COL (MISCELLANEOUS) ×1 IMPLANT
UNDERPAD 30X30 INCONTINENT (UNDERPADS AND DIAPERS) ×3 IMPLANT
WATER STERILE IRR 1000ML POUR (IV SOLUTION) ×3 IMPLANT

## 2013-03-03 NOTE — Progress Notes (Signed)
Patient's skin overall was cool to touch, could not obtain accurate oral or axillary temp.  Rectal temp obtained, it was 94.5, patient was covered with warm blankets and [employer].  Pulses were palpated in the radial sites and doppler was used for pedal.  New dialysis catheter site was clean, dry, and no evidence of bleeding or hematoma.

## 2013-03-03 NOTE — Progress Notes (Signed)
         [facility]           OF [place]   By report, patient was found confused earlier around midnight in a pool of blood with SBP in 80-90s.  He was found to be bleeding from his left thigh AVG.  By report, his BG at that time was also 15.  The patient was also fully anticoagulated for RIJ DVT.  I elected to emergently take this patient back to the OR.  The patient agreed verbally to proceed, though his mental status was somewhat altered as he continued to perseverate during our discussion.  The plan was ligation of left thigh AVG and placement of tunneled dialysis catheter.  Leonides Sake, MD [facility] [place] Office: [phone] Pager: [phone]

## 2013-03-03 NOTE — Progress Notes (Signed)
[facility] Progress Note  Subjective:   Bled out from L thigh AVG over night.  Emergency ligation and placement of Rt fem TDC. Doing well. No complaints other than wanting food.  Objective Filed Vitals:   03/03/13 0900 03/03/13 1000 03/03/13 1126 03/03/13 1127  BP: 147/78 162/65 140/81 140/81  Pulse: 70 62 59   Temp:      TempSrc:      Resp: 16 11    Height:      Weight:      SpO2: 100% 100%     Physical Exam General: Cooperative, slow mentation, trace facial edema, NAD Heart: RRR, no murmur or rub Lungs: Bilat rhonchi R > L, no wheezes or crackles Abdomen: Soft, NT, non-distended, normal BS Extremities: RUE swollen and tender, No LE edema Dialysis Access: New rt fem TDC. (RFA AVG ligated, L thigh AVG ligated)  Dialysis Orders: Center: [facility] on MWF  EDW 61 kg HD Bath 2.0 k Ca2.25, Time 4hr Heparin 2400. Access  l fem AVGG BFR 450 DFR af 1.5  Hectorol 0 (pth 56) mcg IV/HD Epogen 6800 Units IV/HD Venofer 50mg  per week    Assessment/Plan: 1. RUE edema/ R IJ DVT/ Thrombus Axillary and Brachiocephalic Veins - patent R arm AVG ligated 11/13 due to venous HTN, on anticoagulation per pharmacy 2. Bleed from L thigh AVG- ligated today 11/17 Dr Imogene Burn w placement of thigh cath 3. ESRD - HD MWF, K+ 4.5, HD today no heparin, using new L thigh catheter 4. Hypertension/volume - SBPs 140s on Amlodipine, clonidine and metoprolol. Up 3kg 5. Anemia - Hgb  8.0 > 7.6  S/p 2 units PRBCs overnight. Continue Aranesp 100 q Wed. Weekly Fe on Tuesdays. Transfuse PRN  6. Metabolic bone disease - Ca and phos controlled. Phoslyra here due to swallowing difficulty. No Vit D. Renal panel pending. 7. Goiter - Dr. Gerrit Friends managing, not a surgical candidate. 8. Nutrition - NPO for now. Dys 3 with 1200 cc fluid restriction when appropriate. Cont multivitamin, Breeze 9. DM Type 1 - per primary 10. Dispo - Lives at home. Will need a caregiver and INR follow up with PCP. Rx for Phoslyra 5ml ac if  still having trouble swallowing  Clydie Braun E. Broadus John, PA-C [facility] Pager [phone] 03/03/2013,11:31 AM  LOS: 6 days   I have seen and examined patient, discussed with PA and agree with assessment and plan as outlined above. [name] MD pager [phone]    cell [phone] 03/03/2013, 4:58 PM     Additional Objective Labs: Basic Metabolic Panel:  Recent Labs Lab 02/26/13 1326  02/28/13 0840 03/02/13 0835 03/03/13 0425  NA 128*  < > 127* 128* 129*  K 4.7  < > 4.5 5.3* 4.5  CL 88*  < > 88* 93* 92*  CO2 24  < > 24 22 24   GLUCOSE 176*  < > 171* 60* 125*  BUN 41*  < > 29* 29* 39*  CREATININE 7.10*  < > 5.38* 4.90* 6.02*  CALCIUM 8.6  < > 8.8 9.2 8.5  PHOS 4.1  --  3.7  --   --   < > = values in this interval not displayed. Liver Function Tests:  Recent Labs Lab 02/26/13 1326 02/28/13 0840  ALBUMIN 2.9* 3.1*   No results found for this basename: LIPASE, AMYLASE,  in the last 168 hours CBC:  Recent Labs Lab 02/27/13 0500 02/28/13 0655 03/01/13 0556 03/02/13 0835 03/03/13 0045 03/03/13 0425  WBC 7.2 6.0 6.4  7.4  --  8.1  HGB 11.2* 10.5* 10.4* 10.9* 7.6* 8.0*  HCT 35.3* 33.0* 32.5* 33.8* 24.1* 23.2*  MCV 97.0 95.4 95.6 94.9  --  92.8  PLT 255 240 258 250  --  178   Blood Culture    Component Value Date/Time   SDES BLOOD RIGHT HAND 09/22/2011 1145   SPECREQUEST BOTTLES DRAWN AEROBIC ONLY 5CC 09/22/2011 1145   CULT NO GROWTH 5 DAYS 09/22/2011 1145   REPTSTATUS 09/29/2011 FINAL 09/22/2011 1145    Cardiac Enzymes: No results found for this basename: CKTOTAL, CKMB, CKMBINDEX, TROPONINI,  in the last 168 hours CBG:  Recent Labs Lab 03/03/13 0015 03/03/13 0028 03/03/13 0048 03/03/13 0419 03/03/13 0752  GLUCAP 60* 149* 218* 146* 122*   Iron Studies: No results found for this basename: IRON, TIBC, TRANSFERRIN, FERRITIN,  in the last 72 hours @lablastinr3 @ Studies/Results: Portable Chest Xray  03/03/2013   CLINICAL DATA:  Endotracheal tube  placement.  EXAM: PORTABLE CHEST - 1 VIEW  COMPARISON:  02/24/2013  FINDINGS: Endotracheal tube ends 3 cm above the carina. Gastric suction tube side port ends in the distal esophageal region, approximately 6 cm above the carina. Dialysis catheter, split tip, from below, with tips terminating in the right atrial region. The medial tip it is directed towards the atrioventricular valve.  Unchanged appearance of right brachiocephalic vein stenting, with wasting cranially.  Mild lower lung atelectasis.  Normal heart size. Widened upper mediastinum related to known, large goiter.  For discussion at am rounds, these results were called by telephone at the time of interpretation on 03/03/2013 at 4:04 AM to RN Puja, who verbally acknowledged these results.  IMPRESSION: 1. Endotracheal tube in good position. 2. Mildly short nasogastric tube, with side port in the distal esophageal region. Advancement by 5 cm would provide more optimal positioning. 3. Dialysis catheter from below is redundant, with 1 of the tips in the region of the atrioventricular valve.   Electronically Signed   By: Tiburcio Pea M.D.   On: 03/03/2013 04:06   Dg Fluoro Guide Cv Line-no Report  03/03/2013   CLINICAL DATA: Bleeding Thigh Graft   FLOURO GUIDE CV LINE  Fluoroscopy was utilized by the requesting physician.  No radiographic  interpretation.    Medications: . sodium chloride 20 mL/hr at 03/02/13 1056  . dextrose 5 % and 0.45% NaCl 50 mL/hr at 03/03/13 0900  . propofol Stopped (03/03/13 0840)   . amLODipine  10 mg Oral QHS  . antiseptic oral rinse  15 mL Mouth Rinse QID  . aspirin EC  81 mg Oral Daily  . calcium acetate (Phos Binder)  667 mg Oral TID WC  . chlorhexidine  15 mL Mouth Rinse BID  . cloNIDine  0.1 mg Oral TID  . darbepoetin (ARANESP) injection - DIALYSIS  100 mcg Intravenous Q Wed-HD  . docusate sodium  100 mg Oral BID  . feeding supplement (RESOURCE BREEZE)  1 Container Oral BID BM  . ferric gluconate  (FERRLECIT/NULECIT) IV  62.5 mg Intravenous Weekly  . FLUoxetine  5 mg Oral Daily  . influenza vac split quadrivalent PF  0.5 mL Intramuscular Tomorrow-1000  . insulin aspart  0-9 Units Subcutaneous Q4H  . levothyroxine  112 mcg Oral QAC breakfast  . metoprolol  75 mg Oral BID  . multivitamin  1 tablet Oral QHS  . pantoprazole (PROTONIX) IV  40 mg Intravenous Daily  . simvastatin  5 mg Oral q1800  . sodium chloride  3 mL Intravenous  Q12H  . temazepam  15 mg Oral Custom  . THROMBI-PAD  1 each Topical Once

## 2013-03-03 NOTE — Progress Notes (Signed)
Hypoglycemic Event  CBG: 19  Treatment: D50 IV 50 mL and Glucagon IM 1 mg  Symptoms: Sweaty, Shaky and Nervous/irritable  Follow-up CBG: Time:00:15 CBG Result:60 Follow-up CBG: Time:00:35   CBG Result:149 Follow-up CBG: Time:00:50 CBG Result: 218  Possible Reasons for Event: Inadequate meal intake and Unknown  Comments/MD notified:MD notified, will continue to monitor.    Leim Fabry, Inari Shin  Remember to initiate Hypoglycemia Order Set & complete

## 2013-03-03 NOTE — H&P (Signed)
Name: Marcus [name] MRN: [id number] DOB: December 27, 1955    LOS: 6  Referring Provider:  Dr. Imogene Burn Reason for Referral:  Ventilatory management  PULMONARY / CRITICAL CARE MEDICINE  HPI:  Marcus [name] is a 57 year old man with past medical history of end-stage renal disease, goiter and internal jugular thrombus.  He was being anticoagulated for his DVT when he developed acute bleeding from his dialysis graft in his left thigh.  He was taken emergently to the OR by vascular surgery.  Because of his goiter, anesthesia was uncomfortable extubating in the PACU and decided to admit to the surgical ICU for controlled extubation the following morning.  PCCM is consultation for ventilator management.  Past Medical History  Diagnosis Date  . Hypothyroidism   . Hyperlipidemia   . CVA (cerebrovascular accident)     hx CVA x 2  . GI bleed     2006, mallory-weiss tear  . Pulmonary edema     nov 2011, dry wt decreased  . Goiter     by CT Apr 2013  . Depression   . Cocaine abuse     past history  . Tobacco abuse     history of  . GERD (gastroesophageal reflux disease)   . Cataract   . Insomnia   . Chronic pain   . HTN (hypertension)     sees Dr. Ardelle Park, Rosalita Levan  . Neuromuscular disorder     diabetic neuropathy  . Occlusion of carotid stent     Per patients nursing aid  . ESRD (end stage renal disease) on dialysis     hd - [facility] - [place]  . Pneumonia     hx of  . Diabetes mellitus     Sees Dr. Reather Littler [phone] -- fasting blood 70-110s  . DKA (diabetic ketoacidosis)     severe, Dec 2011  . History of blood transfusion   . On home oxygen therapy     2l nasal cannula at night   Past Surgical History  Procedure Laterality Date  . Av fistula placement  03/25/2008    right forearm  . Av fistula placement  04/23/2012    Procedure: INSERTION OF ARTERIOVENOUS (AV) GORE-TEX GRAFT THIGH;  Surgeon: Sherren Kerns, MD;  Location[facility] OR;  Service: Vascular;  Laterality: Left;  .  Arteriovenous graft placement Left     thigh  . Carotid stent insertion Right   . Dyalisis catheter    . Eye surgery Bilateral     cataracts  . Thyroid surgery  01/28/2013    Dr Gerrit Friends  . Lymph node biopsy Right 01/28/2013    Procedure: LYMPH NODE BIOPSY;  Surgeon: Velora Heckler, MD;  Location: [facility] OR;  Service: General;  Laterality: Right;  . Mass biopsy N/A 01/28/2013    Procedure: NECK EXPLORATION;  Surgeon: Velora Heckler, MD;  Location: [facility] OR;  Service: General;  Laterality: N/A;   Prior to Admission medications   Medication Sig Start Date End Date Taking? Authorizing Provider  amLODipine (NORVASC) 10 MG tablet Take 10 mg by mouth at bedtime.    Yes Historical Provider, MD  aspirin EC 81 MG tablet Take 81 mg by mouth daily.   Yes Historical Provider, MD  calcium acetate (PHOSLO) 667 MG capsule Take 1,334 mg by mouth 3 (three) times daily with meals.   Yes Historical Provider, MD  cloNIDine (CATAPRES) 0.1 MG tablet Take 0.1 mg by mouth 3 (three) times daily.   Yes Historical Provider, MD  docusate sodium (  COLACE) 100 MG capsule Take 100 mg by mouth 2 (two) times daily.    Yes Historical Provider, MD  EASY TOUCH INSULIN SYRINGE 31G X 5/16" 0.5 ML MISC  12/19/12  Yes Historical Provider, MD  FLUoxetine (PROZAC) 10 MG tablet Take 5 mg by mouth every morning.    Yes Historical Provider, MD  HYDROcodone-acetaminophen (NORCO/VICODIN) 5-325 MG per tablet Take 1-2 tablets by mouth every 4 (four) hours as needed for pain. 02/06/13  Yes Velora Heckler, MD  insulin aspart (NOVOLOG) 100 UNIT/ML injection Inject 4-6 Units into the skin 3 (three) times daily before meals. Inject 4 units after breakfast, 5 units after lunch, and 6 units after night   Yes Historical Provider, MD  insulin glargine (LANTUS) 100 UNIT/ML injection Inject 9-13 Units into the skin 2 (two) times daily. Inject 13 units in the morning, and 9 units in the evening.   Yes Historical Provider, MD  levothyroxine (SYNTHROID, LEVOTHROID)  112 MCG tablet Take 112 mcg by mouth daily before breakfast.   Yes Historical Provider, MD  metoprolol (LOPRESSOR) 50 MG tablet Take 75 mg by mouth 2 (two) times daily. Hold morning dose on dialysis days   Yes Historical Provider, MD  omeprazole (PRILOSEC) 20 MG capsule Take 20 mg by mouth daily.   Yes Historical Provider, MD  pravastatin (PRAVACHOL) 10 MG tablet Take 10 mg by mouth at bedtime.   Yes Historical Provider, MD  PROAIR HFA 108 (90 BASE) MCG/ACT inhaler Inhale 2 puffs into the lungs every 4 (four) hours as needed for wheezing or shortness of breath.  12/08/12  Yes Historical Provider, MD  promethazine (PHENERGAN) 25 MG tablet Take 25 mg by mouth every 6 (six) hours as needed. For nausea   Yes Historical Provider, MD  sevelamer carbonate (RENVELA) 800 MG tablet Take 800 mg by mouth 3 (three) times daily with meals.  10/23/12  Yes Historical Provider, MD  temazepam (RESTORIL) 15 MG capsule Take 15 mg by mouth 4 (four) times a week. Monday, Wednesday, Friday, saturday   Yes Historical Provider, MD   Allergies Allergies  Allergen Reactions  . Penicillins Other (See Comments)    Unknown reaction    Family History History reviewed. No pertinent family history. Social History  reports that he has been smoking Cigarettes.  He has been smoking about 0.25 packs per day. He has never used smokeless tobacco. He reports that he does not drink alcohol or use illicit drugs.  Review Of Systems:  A review of 14 systems could not be obtained as patient is intubated and sedated  Brief patient description:  57 year-old with end-stage renal disease, DVTs and goiter, developed acute bleeding on full dose anticoagulation   Current Status:  Vital Signs: Temp:  [94.5 F (34.7 C)-98.7 F (37.1 C)] 94.5 F (34.7 C) (11/17 0615) Pulse Rate:  [44-70] 49 (11/17 0715) Resp:  [12-20] 12 (11/17 0715) BP: (110-220)/(59-114) 131/64 mmHg (11/17 0715) SpO2:  [97 %-100 %] 100 % (11/17 0715) FiO2 (%):  [40  %-100 %] 40 % (11/17 0600) Weight:  [64 kg (141 lb 1.5 oz)] 64 kg (141 lb 1.5 oz) (11/16 2132)  Physical Examination: General:  Lying in bed intubated sedated Neuro:  Does not respond to verbal or tactile stimuli HEENT:  Pupils 3 mm, reactive Neck:  Goiter, large surgical incision clean dry and intact Cardiovascular:  Normal rate regular rhythm Lungs:  Clear to auscultation bilaterally Abdomen:  Soft nondistended nontender Musculoskeletal:  No joint abnormalities Skin:  No  rashes bruises or other lesions  Principal Problem:   Jugular vein thrombosis, right Active Problems:   ESRD (end stage renal disease) on dialysis   Type I (juvenile type) diabetes mellitus without mention of complication, uncontrolled   Thyroid goiter   DVT of axillary vein, acute right   Thyroid mass   Protein-calorie malnutrition, severe   ASSESSMENT AND PLAN  PULMONARY  Recent Labs Lab 03/03/13 0416  PHART 7.495*  PCO2ART 31.2*  PO2ART 226.0*  HCO3 23.8  O2SAT 98.6   Ventilator Settings: Vent Mode:  [-] PRVC FiO2 (%):  [40 %-100 %] 40 % Set Rate:  [12 bmp-16 bmp] 12 bmp Vt Set:  [65 mL-650 mL] 65 mL PEEP:  [5 cmH20] 5 cmH20 Plateau Pressure:  [19 cmH20] 19 cmH20 CXR:  ET tube in good position ETT:  80  A:  Transferred to SICU intubated her controlled extubation P:   Wean ventilator rate to 12 Decrease sedation and perform spontaneous breathing trial in a.m. X-ray uncomfortable there is no further need for surgery in the short term.  RENAL  Recent Labs Lab 02/26/13 1326 02/27/13 0500 02/28/13 0840 03/02/13 0835 03/03/13 0425  NA 128* 132* 127* 128* 129*  K 4.7 3.5 4.5 5.3* 4.5  CL 88* 93* 88* 93* 92*  CO2 24 26 24 22 24   BUN 41* 18 29* 29* 39*  CREATININE 7.10* 4.10* 5.38* 4.90* 6.02*  CALCIUM 8.6 8.6 8.8 9.2 8.5  PHOS 4.1  --  3.7  --   --    Intake/Output     11/16 0701 - 11/17 0700 11/17 0701 - 11/18 0700   P.O. 480    I.V. (mL/kg) 1401.7 (21.9)    Blood 615     Other     NG/GT 30    Total Intake(mL/kg) 2526.7 (39.5)    Emesis/NG output 100    Blood 25    Total Output 125     Net +2401.7          Urine Occurrence     Stool Occurrence       A:  End-stage renal failure P:   Management per nephrology   HEMATOLOGIC  Recent Labs Lab 02/27/13 0500 02/28/13 0655 03/01/13 0556 03/02/13 0835 03/03/13 0045 03/03/13 0425  HGB 11.2* 10.5* 10.4* 10.9* 7.6* 8.0*  HCT 35.3* 33.0* 32.5* 33.8* 24.1* 23.2*  PLT 255 240 258 250  --  178  INR 1.29 1.38 1.93* 2.33*  --  1.96*  APTT  --   --   --   --   --  109*   A:  Acute bleeding in the setting of full dose anticoagulation P:  Will need to discuss with surgery and hematology risk versus benefit of continuing anticoagulation   NEUROLOGIC  A:  Currently sedated with propofol drip P:   Wean drips and perform SBT in a.m.  BEST PRACTICE / DISPOSITION Level of Care:  ICU Primary Service:  Vascular Consultants:  Nephrology Code Status:  Full Diet:  N.p.o. DVT Px:  SCDs GI Px:  PPI Skin Integrity:  Good Social / Family:  No family at bedside at the time of visit  I spent 35 minutes of critical care time in the care of this patient seperate from procedures which are documented elsewhere   Carolan Clines., M.D. Pulmonary and Critical Care Medicine [facility] Pager: [phone]  03/03/2013, 7:22 AM

## 2013-03-03 NOTE — Significant Event (Addendum)
Rapid Response Event Note  Overview: called by bedside RN, pt. W/ CBG 19 and left thigh graft bleeding, found pt. In puddle of blood.      Initial Focused Assessment: Marcus Coons NP present, glucagon x1 and D50 x2 given, continual manual pressure being held on thigh by nurse. Heparin gtt stopped.Pt. Mentating, VSS, 100% on 3L   Interventions: Vascular MD called and en route, NS @ 250cc/hr (renal pt), CBC, T&C. BP slowly decreased with lowest reading being 90/44, pt. remained alert.   Event Summary: Dr. Imogene Burn (vascular) arrived to evaluate graft, pt. Taken directly to OR   at      at          Mallie Darting

## 2013-03-03 NOTE — Progress Notes (Signed)
VVS Progress Note  Pt intubated - weaning in process  VSS  Lab Results  Component Value Date   WBC 8.1 03/03/2013   HGB 8.0* 03/03/2013   HCT 23.2* 03/03/2013   MCV 92.8 03/03/2013   PLT 178 03/03/2013   Left LE warm. Palp DP Left thigh soft, no bleeding  RLE warm, catheter in place  Assessment: surgically stable Vent per CCM OK to restart anticoagulation from our standpoint Discussion re: further access options when pt more stable  Stefana Lodico J 7:33 AM 03/03/2013

## 2013-03-03 NOTE — Op Note (Signed)
OPERATIVE NOTE  PROCEDURE: 1. Ligation of left thigh arteriovenous graft  2. Right femoral vein tunneled dialysis catheter placement 3. Right femoral vein cannulation under ultrasound guidance  PRE-OPERATIVE DIAGNOSIS: acute uncontrolled bleeding from left thigh arteriovenous graft, end-stage renal failure  POST-OPERATIVE DIAGNOSIS: same as above  SURGEON: Leonides Sake, MD  ANESTHESIA: general  ESTIMATED BLOOD LOSS: 30 cc  FINDING(S): 1.  Goretex seroma adjacent to arterial arm of graft with associated fluid 2.  Intact subcutaneous tissue over thigh graft in thinned area over arterial arm of graft 2.  Tips of the catheter in the right atrium on fluoroscopy  SPECIMEN(S):  Anaerobic and aerobic cultures of goretex seroma  INDICATIONS:   Marcus [name] is a 57 y.o. male who presents with acute bleeding from the arterial arm of the left thigh arteriovenous graft.  The patient was found in a pool of blood with a hypotension and altered mental status.  He was emergently taken to the OR for ligation of thigh arteriovenous graft and placement of tunneled dialysis catheter.    DESCRIPTION: After written full informed consent was obtained from the patient, the patient was taken back to the operating room.  Prior to induction, the patient was given IV antibiotics.  After obtaining adequate sedation, the patient was prepped and draped in the standard fashion for a right femoral vein tunneled dialysis catheter placement and left thigh arteriovenous graft ligation.  First, I interrogated the left thigh arteriovenous graft with a Sonosite and identified that the prior bleeding limb was indeed the arterial arm of the graft.  I marked the graft proximal to the bleeding site which had some superficial erosions.  I made an incision and dissected out the graft, which was quite shallow at this location.  I clamped the graft proximally and distally in this incision and transected the graft.  I oversewed the  arterial side of the graft with a running stitch of 5-0 prolene in a double layer.  I then tied off the distal end of this graft with a 3-0 silk suture ligature.  I elected to not dissect and ligate the venous armof this graft, as the venous arm had not bled and this patient was fully anticoagulated currently, though fresh frozen plasma would be given later in the case as it was finishing.  I reapproximated the subcutaneous tissue in the incision with a double layer stitch of 4-0 Vicryl.  This incision closure was reinforced with Dermabond.  At this point, I briefly explored the area of blistered skin adjacent to the bleeding site.  There did not appear to be any exposed graft in this wound, though the graft is easily palpable here.  A sterile bandage was applied.  At this point, I turned my attention to the right groin.  Under ultrasound guidance, the right femoral vein was cannulated with the 18 gauge needle.  A J-wire was then placed into the inferior vena cava under fluroscopic guidance.  The wire was then secured in place with a clamp to the drapes.  I then made stab incisions at the cannulation and exit sites.  I dissected from the exit site to the cannulation site with a metal dissected and dilated the subcutaneous tunnel with a plastic dilator.  The wire was then unclamped and I removed the needle.  The skin tract and venotomy was dilated serially with dilators.  Finally, the dilator-sheath was placed under fluroscopic guidance into the right iliac vein.  The dilator and wire were removed.  A 55  cm Diatek catheter was placed under fluoroscopic guidance into the right atrium.  The sheath was broken and peeled away while holding the catheter cuff at the level of the skin.  The back of the Diatek was connected to the metal dilator and delivered through the subcutaneous tunnel.  The back end of this catheter was transected, revealing the two lumens of this catheter.  The ports were docked onto these two  lumens.  The catheter hub was then screwed into place.  Each port was tested by aspirating and flushing.  No resistance was noted.  Each port was then thoroughly flushed with heparinized saline.  The catheter was secured in placed with two interrupted stitches of 3-0 Nylon tied to the catheter.  The cannulation incision was closed with a U-stitch of 4-0 Monocryl.  The cannulation and exit incisions were cleaned and sterile bandages applied.  Each port was then loaded with concentrated heparin (1000 Units/mL) at the manufacturer recommended volumes to each port.  Sterile caps were applied to each port.  On completion fluoroscopy, the tips of the catheter were in the right atrium, and there was no evidence of pneumothorax.  As I have low suspicion of infected graft, I doubt there is any advantage to empiric antibiotics.  If the cultures come back positive, possible excision of the entire graft might be necessary.  This would be complicated by the Goretex seroma.  COMPLICATIONS: none  CONDITION: guarded  Leonides Sake, MD [facility] [place] Office: [phone] Pager: [phone]  03/03/2013, 2:26 AM

## 2013-03-03 NOTE — Telephone Encounter (Addendum)
Message copied by Fredrich Birks on Mon Mar 03, 2013  3:04 PM ------      Message from: Marlowe Shores      Created: Fri Feb 28, 2013  9:45 AM       Don't know if Edilia Bo wants F/U, please check with him ------  Yes. Pt needs evaluation for new access.  03/03/13: unable to reach pt by phone, mailed letter, dpm

## 2013-03-03 NOTE — Progress Notes (Signed)
Patient has been bradycardiac since the initiation of propofol drip for sedation,  Upon being aroused, patient's HR is in the 50-60s. MD aware.

## 2013-03-03 NOTE — Progress Notes (Addendum)
      [facility]           [facility] [place]   Daily Progress Note  Assessment/Planning: POD #0 s/p Ligation of L thigh AVG, R femoral TDC   No evidence of bleeding  Ok to resume anticoagulation in one day  Subjective  - Day of Surgery  Pt awake and indicates he wants to be extubated  Objective Filed Vitals:   03/03/13 0715 03/03/13 0722 03/03/13 0800 03/03/13 0900  BP: 131/64 131/64  147/78  Pulse: 49 51  70  Temp:   97.2 F (36.2 C)   TempSrc:   Axillary   Resp: 12 12  16   Height:      Weight:      SpO2: 100% 100%  100%    Intake/Output Summary (Last 24 hours) at 03/03/13 0916 Last data filed at 03/03/13 0800  Gross per 24 hour  Intake 2753.51 ml  Output    125 ml  Net 2628.51 ml    PULM  BLL rales, intubated on vent CV  RR, bradycardiac GI  soft, NTND VASC  L thigh incision c/d/i, R TDC site dry without hematoma  Laboratory CBC    Component Value Date/Time   WBC 8.1 03/03/2013 0425   HGB 8.0* 03/03/2013 0425   HCT 23.2* 03/03/2013 0425   PLT 178 03/03/2013 0425    BMET    Component Value Date/Time   NA 129* 03/03/2013 0425   K 4.5 03/03/2013 0425   CL 92* 03/03/2013 0425   CO2 24 03/03/2013 0425   GLUCOSE 125* 03/03/2013 0425   BUN 39* 03/03/2013 0425   CREATININE 6.02* 03/03/2013 0425   CALCIUM 8.5 03/03/2013 0425   CALCIUM 8.8 05/20/2008 0705   GFRNONAA 9* 03/03/2013 0425   GFRAA 11* 03/03/2013 0425    Leonides Sake, MD [facility] [place] Office: [phone] Pager: [phone]  03/03/2013, 9:16 AM

## 2013-03-03 NOTE — Progress Notes (Signed)
[facility] hospitalist progress note. Chief complaint. Hypoglycemia, left thigh dialysis shunt bleed. This 57 year old male in hospital with right IJ thrombus. Patient had been on a heparin drip and Coumadin concurrently. Nursing called me to notify that patient had a hypoglycemic incident with blood sugar reading 19. He was given 2 amps of D50 and glucagon with glucose improving to 200 range. During assessment nursing noted a copious amount of bleeding originating from the left thigh dialysis shunt. Pressure was held and vascular surgery notified. In the interim the heparin drip was discontinued and fluids given for borderline systolic blood pressure in the 90s. Ordered for type and screen stat, hemoglobin and hematocrit stat, and one unit of packed red blood cells. I deferred any reversal of heparin or Coumadin pending arrival of vascular surgery. Doctor Imogene Burn of vascular surgery did arrive and requested that we transport the patient directly to the operating room. Patient arrived alert and in stable condition low blood pressures somewhat marginal with systolic in the mid 90s. Will followup postoperatively.

## 2013-03-03 NOTE — Anesthesia Preprocedure Evaluation (Addendum)
Anesthesia Evaluation  Patient identified by MRN, date of birth, ID band Patient awake    Airway Mallampati: II TM Distance: >3 FB Neck ROM: Limited Positive for:  Tracheal deviation   Dental  (+) Edentulous Upper, Edentulous Lower and Dental Advidsory Given   Pulmonary shortness of breath, Current Smoker,          Cardiovascular hypertension, + Peripheral Vascular Disease Rhythm:Regular Rate:Normal     Neuro/Psych PSYCHIATRIC DISORDERS  Neuromuscular disease CVA    GI/Hepatic GERD-  ,  Endo/Other  diabetes, Well ControlledHypothyroidism   Renal/GU ESRF and DialysisRenal disease     Musculoskeletal   Abdominal   Peds  Hematology   Anesthesia Other Findings Poor historian  Reproductive/Obstetrics                          Anesthesia Physical Anesthesia Plan  ASA: IV and emergent  Anesthesia Plan: General   Post-op Pain Management:    Induction: Intravenous  Airway Management Planned: Oral ETT  Additional Equipment:   Intra-op Plan:   Post-operative Plan: Post-operative intubation/ventilation  Informed Consent: I have reviewed the patients History and Physical, chart, labs and discussed the procedure including the risks, benefits and alternatives for the proposed anesthesia with the patient or authorized representative who has indicated his/her understanding and acceptance.     Plan Discussed with: CRNA and Surgeon  Anesthesia Plan Comments:         Anesthesia Quick Evaluation

## 2013-03-03 NOTE — Anesthesia Postprocedure Evaluation (Signed)
  Anesthesia Post-op Note  Patient: Marcus [name]  Procedure(s) Performed: Procedure(s): LIGATION Left ARTERIOVENOUS GORTEX Thigh GRAFT (Left) INSERTION OF DIALYSIS CATHETER (Right)  Patient Location: SICU  Anesthesia Type:General  Level of Consciousness: sedated and Patient remains intubated per anesthesia plan  Airway and Oxygen Therapy: Patient remains intubated per anesthesia plan  Post-op Pain: none  Post-op Assessment: Post-op Vital signs reviewed  Post-op Vital Signs: stable  Complications: No apparent anesthesia complications

## 2013-03-03 NOTE — Procedures (Signed)
Extubation Procedure Note  Patient Details:   Name: Marcus [name] DOB: May 20, 1955 MRN: [id number]   Airway Documentation:  Airway 7.5 mm (Active)  Secured at (cm) 23 cm 03/03/2013  8:00 AM  Measured From Lips 03/03/2013  8:00 AM  Secured Location Center 03/03/2013  8:00 AM  Secured By [employer] 03/03/2013  8:00 AM  Tube Holder Repositioned Yes 03/03/2013  7:22 AM  Site Condition Dry 03/03/2013  8:00 AM    Evaluation  O2 sats: stable throughout Complications: No apparent complications Patient did tolerate procedure well. Bilateral Breath Sounds: Diminished;Clear Suctioning: Airway Yes. Pt can follow commands and has a positive cuff leak. Pt has good cough/gag. Extubated pt to 4Lnc. No stridor. BBS clr dim. Pt tol well  Romanita Fager M 03/03/2013, 9:09 AM

## 2013-03-03 NOTE — Progress Notes (Signed)
Patient began to yell "help" out into the hallway around 2355. RN and NT went into the room to assess the patient who was disoriented and could concentrate. Only could repeat "help". Same incident happened this morning and it was due to a low CBG. So RN informed NT to check CBG. Resulted at 19. MD notified. D50 was unavailable at the time so IM glucagon was chosen to give patient. As RN was getting ready to turn patient over to give IM injection RN noticed a pool of blood between the patients legs, on gown, on upper back and down to his legs and feet. Staff emergency button was initiated. Pressure was applied to patients left thigh graft. Second RN assisted and gave IM injection and two amps of D50. On call MD notified again, then came to bedside. Rapid response notified, then cam to bedside. Renal notified. Vascular surgeon notified. Labs were drawn. Patient needed OR. Transferred patient via Rapid Response, On call MD, Surgeon and 3 floor RNs. Report given to OR RNs upon arrival.   [facility] BSN, [place]  [id number]

## 2013-03-03 NOTE — Progress Notes (Signed)
Patient ID: Marcus [name], male   DOB: 03-13-1956, 57 y.o.   MRN: [id number]  General Surgery - [facility] - Progress Note  Subjective: Patient in ICU, awake, responsive, on vent.  Objective: Vital signs in last 24 hours: Temp:  [94.5 F (34.7 C)-98.7 F (37.1 C)] 97.2 F (36.2 C) (11/17 0800) Pulse Rate:  [44-70] 51 (11/17 0722) Resp:  [12-20] 12 (11/17 0722) BP: (110-220)/(59-114) 131/64 mmHg (11/17 0722) SpO2:  [97 %-100 %] 100 % (11/17 0722) FiO2 (%):  [40 %-100 %] 40 % (11/17 0800) Weight:  [141 lb 1.5 oz (64 kg)] 141 lb 1.5 oz (64 kg) (11/16 2132) Last BM Date: 02/25/13  Intake/Output from previous day: 11/16 0701 - 11/17 0700 In: 2590.1 [P.O.:480; I.V.:1465.1; Blood:615; NG/GT:30] Out: 125 [Emesis/NG output:100; Blood:25]  Exam: HEENT - clear, not icteric Neck - soft, incision clear and dry and healing Chest - coarse bilaterally Cor - RRR Abd - soft Ext - persistent moderate edema RUE, dressing dry and intact Neuro - grossly intact, no focal deficits  Lab Results:   Recent Labs  03/02/13 0835 03/03/13 0045 03/03/13 0425  WBC 7.4  --  8.1  HGB 10.9* 7.6* 8.0*  HCT 33.8* 24.1* 23.2*  PLT 250  --  178     Recent Labs  03/02/13 0835 03/03/13 0425  NA 128* 129*  K 5.3* 4.5  CL 93* 92*  CO2 22 24  GLUCOSE 60* 125*  BUN 29* 39*  CREATININE 4.90* 6.02*  CALCIUM 9.2 8.5    Studies/Results: Portable Chest Xray  03/03/2013   CLINICAL DATA:  Endotracheal tube placement.  EXAM: PORTABLE CHEST - 1 VIEW  COMPARISON:  02/24/2013  FINDINGS: Endotracheal tube ends 3 cm above the carina. Gastric suction tube side port ends in the distal esophageal region, approximately 6 cm above the carina. Dialysis catheter, split tip, from below, with tips terminating in the right atrial region. The medial tip it is directed towards the atrioventricular valve.  Unchanged appearance of right brachiocephalic vein stenting, with wasting cranially.  Mild lower  lung atelectasis.  Normal heart size. Widened upper mediastinum related to known, large goiter.  For discussion at am rounds, these results were called by telephone at the time of interpretation on 03/03/2013 at 4:04 AM to RN Puja, who verbally acknowledged these results.  IMPRESSION: 1. Endotracheal tube in good position. 2. Mildly short nasogastric tube, with side port in the distal esophageal region. Advancement by 5 cm would provide more optimal positioning. 3. Dialysis catheter from below is redundant, with 1 of the tips in the region of the atrioventricular valve.   Electronically Signed   By: Tiburcio Pea M.D.   On: 03/03/2013 04:06    Assessment / Plan: Events of past 12 hours noted.  Patient weaning this AM and will likely extubate later this morning per CCM.  Anticoagulation restarted for RUE and R IJ thrombosis.  I reviewed CT scan of neck and chest in PACS.  Large goiter extending substernally to aortic arch.  Minimal compression of airway, esophagus is pushed posteriorly but not encircled by thyroid gland.  Patient's swallowing eval by speech path was relatively normal, not showing any obstructive aspects.  Airway is patent.  I would not plan thyroidectomy in this patient.  It would be a major procedure requiring median sternotomy and would be a very high risk procedure for this patient.  I will discuss his case with endocrinology and make referral for out-patient consultation once he has recovered  from the events of this hospitalization.  Perhaps he would benefit from suppressive therapy or radioactive iodine or even external beam irradiation for control of the size of his goiter.  Surgical intervention would be a last resort given his comorbidities.  Will follow with you.  Velora Heckler, MD, [facility] Office: [phone]  03/03/2013

## 2013-03-03 NOTE — Progress Notes (Signed)
Name: Marcus [name] MRN: [id number] DOB: May 23, 1955    LOS: 6  Referring Provider:  Dr. Imogene Burn Reason for Referral:  Ventilatory management  PULMONARY / CRITICAL CARE MEDICINE   Brief patient description:  57 year-old with end-stage renal disease, DVTs and goiter, developed acute bleeding on full dose anticoagulation  Significant events/studies: 11/13 Ligation R forearm AV graft 11/17 Ligation left thigh AV graft, R femoral vein tunnled dialysis catheter placed, R femoral vein cannulation   Cultures:  Antibiotics  Tubes/Lines: 11/16 ETT >>  Current Status:  Vital Signs: Temp:  [94.5 F (34.7 C)-98.7 F (37.1 C)] 97.2 F (36.2 C) (11/17 0800) Pulse Rate:  [44-70] 51 (11/17 0722) Resp:  [12-20] 12 (11/17 0722) BP: (110-220)/(59-114) 131/64 mmHg (11/17 0722) SpO2:  [97 %-100 %] 100 % (11/17 0722) FiO2 (%):  [40 %-100 %] 40 % (11/17 0800) Weight:  [64 kg (141 lb 1.5 oz)] 64 kg (141 lb 1.5 oz) (11/16 2132)  Physical Examination:  Gen: somnolent on vent, arouses HEENT: NCAT, PERRL, EOMi PULM: CTA B,diminished bases CV: RRR, no mgr AB: BS infrequent, nontender Ext: L femoral AV graft site dressing c/d/i, R femoral dialysis catheter in place; R arm swelling throughout Derm; no breakdown Neuro: Somnolent but arouses, follows commands, maew  Principal Problem:   Jugular vein thrombosis, right Active Problems:   ESRD (end stage renal disease) on dialysis   Type I (juvenile type) diabetes mellitus without mention of complication, uncontrolled   Thyroid goiter   DVT of axillary vein, acute right   Thyroid mass   Protein-calorie malnutrition, severe   ASSESSMENT AND PLAN  PULMONARY  Recent Labs Lab 03/03/13 0416  PHART 7.495*  PCO2ART 31.2*  PO2ART 226.0*  HCO3 23.8  O2SAT 98.6   Ventilator Settings: Vent Mode:  [-] PRVC FiO2 (%):  [40 %-100 %] 40 % Set Rate:  [12 bmp-16 bmp] 12 bmp Vt Set:  [65 mL-650 mL] 650 mL PEEP:  [5 cmH20] 5 cmH20 Plateau  Pressure:  [19 cmH20] 19 cmH20 CXR:  ET tube in good position ETT:  80  A:  On vent post vascular procedure Mediastinal thyroid > 02/2013 CT images reviewed, minimal if any airway compromise noted P:   -SBT now -extubate when more awake -Airway kit/Glidescope to bedside for extubation  RENAL  Recent Labs Lab 02/26/13 1326 02/27/13 0500 02/28/13 0840 03/02/13 0835 03/03/13 0425  NA 128* 132* 127* 128* 129*  K 4.7 3.5 4.5 5.3* 4.5  CL 88* 93* 88* 93* 92*  CO2 24 26 24 22 24   BUN 41* 18 29* 29* 39*  CREATININE 7.10* 4.10* 5.38* 4.90* 6.02*  CALCIUM 8.6 8.6 8.8 9.2 8.5  PHOS 4.1  --  3.7  --   --    Intake/Output     11/16 0701 - 11/17 0700 11/17 0701 - 11/18 0700   P.O. 480    I.V. (mL/kg) 1465.1 (22.9) 63.4 (1)   Blood 615    Other     NG/GT 30 100   Total Intake(mL/kg) 2590.1 (40.5) 163.4 (2.6)   Emesis/NG output 100    Blood 25    Total Output 125     Net +2465.1 +163.4        Urine Occurrence     Stool Occurrence       A:  End-stage renal failure P:   HD MWF   CARDIOVASCULAR: A: AV fistula bleed on 11/16 > s/p vascular repair R IJ thrombus P: -post op care per vascular surgery  HEMATOLOGIC  Recent Labs Lab 02/27/13 0500 02/28/13 0655 03/01/13 0556 03/02/13 0835 03/03/13 0045 03/03/13 0425  HGB 11.2* 10.5* 10.4* 10.9* 7.6* 8.0*  HCT 35.3* 33.0* 32.5* 33.8* 24.1* 23.2*  PLT 255 240 258 250  --  178  INR 1.29 1.38 1.93* 2.33*  --  1.96*  APTT  --   --   --   --   --  109*   A:  Acute bleeding in the setting of full dose anticoagulation;  R IJ thrombus Hgb stable post vascular surgery P:  -restarting heparin per vascular protocol  Infectious A:  No acute issues P:  Endocrine: A: DM type I Large Goiter > inflammatory changes P: -continue SSI q4h until eating -General surgery discussing goiter management with Endocrine  NEUROLOGIC  A:  Currently sedated with propofol drip P:   WUA/SBT now  BEST PRACTICE / DISPOSITION Level  of Care:  ICU Primary Service:  Vascular Consultants:  Nephrology Code Status:  Full Diet:  N.p.o. DVT Px:  SCDs GI Px:  PPI Skin Integrity:  Good Social / Family:  No family at bedside at the time of visit  CC time 40 minutes in addition to Dr. Belinda Block.   Max Fickle, M.D. Pulmonary and Critical Care Medicine [facility] Pager: [phone]  03/03/2013, 8:23 AM

## 2013-03-03 NOTE — Progress Notes (Signed)
ANTICOAGULATION CONSULT NOTE - Follow Up Consult  Pharmacy Consult for Heparin/Coumadin  Indication: DVT (RUE) and right IJ thrombus  Allergies  Allergen Reactions  . Penicillins Other (See Comments)    Unknown reaction   Patient Measurements: Height: 6\' 2"  (188 cm) Weight: 141 lb 1.5 oz (64 kg) IBW/kg (Calculated) : 82.2 Heparin Dosing Weight: 62 kg Vital Signs: Temp: 97.2 F (36.2 C) (11/17 0800) Temp src: Axillary (11/17 0800) BP: 162/65 mmHg (11/17 1000) Pulse Rate: 62 (11/17 1000) Labs:  Recent Labs  02/28/13 2049  03/01/13 0556 03/02/13 0835 03/03/13 0045 03/03/13 0425  HGB  --   < > 10.4* 10.9* 7.6* 8.0*  HCT  --   < > 32.5* 33.8* 24.1* 23.2*  PLT  --   --  258 250  --  178  APTT  --   --   --   --   --  109*  LABPROT  --   --  21.5* 24.8*  --  21.7*  INR  --   --  1.93* 2.33*  --  1.96*  HEPARINUNFRC 0.63  --  0.56 0.51  --   --   CREATININE  --   --   --  4.90*  --  6.02*  < > = values in this interval not displayed.  Estimated Creatinine Clearance: 12.3 ml/min (by C-G formula based on Cr of 6.02).    Assessment: 33 YOM with RUE DVT and R IJ thrombus.  Patient noted with left thigh graft bleeding this am and s/p urgent ligation today.  Heparin and coumadin are on hold for now and per MD plans to restart on 03/04/13. INR today = 1.96.  Goal of Therapy:  INR 2-3 Heparin level 0.3-0.7 units/ml Monitor platelets by anticoagulation protocol: Yes   Plan:  -Hold anticoagulation today -Will follow for plans in am -Daily PT/INR  Harland German, Pharm D 03/03/2013 10:50 AM

## 2013-03-03 NOTE — Progress Notes (Signed)
NUTRITION FOLLOW UP  DOCUMENTATION CODES Per approved criteria  -Severe malnutrition in the context of chronic illness   Intervention:    Continue Resource Breeze twice daily (250 kcals, 9 gm protein per 8 fl oz carton) RD to follow for nutrition care plan  Nutrition Dx:   Increased nutrient needs related to HD as evidenced by estimated nutrition needs, ongoing  Goal:   Pt to meet >/= 90% of their estimated nutrition needs, progressing  Monitor:   PO & supplemental intake, weight, labs, I/O's  Assessment:   PMHx significant for ESRD (HD on MWF), CVA x 2, hypothyroidism, cocaine abuse. Admitted with worsening R arm swelling x 1 week. Work-up reveals RUE DVT and thrombus in R IJ. Pt is s/p recent thyroidectomy attempt.  Patient s/p procedures 11/17: LIGATION OF LEFT THIGH ARTERIOVENOUS GRAFT RIGHT FEMORAL VEIN TUNNELED DIALYSIS CATHETER PLACEMENT RIGHT FEMORAL VEIN CANNULATION UNDER ULTRASOUND GUIDANCE  Patient extubated post-op this AM.  S/p bedside swallow evaluation prior to surgery 11/13.  SLP recommending Dys 3--thin liquid diet.  PO intake variable at 45-100% per flowsheet records.  Resource Breeze ordered per RD upon initial nutrition assessment -- remain in place.  Height: Ht Readings from Last 1 Encounters:  03/02/13 6\' 2"  (1.88 m)    Weight Status:   Wt Readings from Last 1 Encounters:  03/02/13 141 lb 1.5 oz (64 kg)    Re-estimated needs:  Kcal: 1800-2000 Protein: 85-95 gm Fluid: 1200 ml  Skin: groin, thigh and arm incisions   Diet Order: Dysphagia 3, thin liquids   Intake/Output Summary (Last 24 hours) at 03/03/13 1359 Last data filed at 03/03/13 1300  Gross per 24 hour  Intake 2856.64 ml  Output    125 ml  Net 2731.64 ml    Labs:   Recent Labs Lab 02/26/13 1326  02/28/13 0840 03/02/13 0835 03/03/13 0425  NA 128*  < > 127* 128* 129*  K 4.7  < > 4.5 5.3* 4.5  CL 88*  < > 88* 93* 92*  CO2 24  < > 24 22 24   BUN 41*  < > 29* 29* 39*   CREATININE 7.10*  < > 5.38* 4.90* 6.02*  CALCIUM 8.6  < > 8.8 9.2 8.5  PHOS 4.1  --  3.7  --   --   GLUCOSE 176*  < > 171* 60* 125*  < > = values in this interval not displayed.  CBG (last 3)   Recent Labs  03/03/13 0419 03/03/13 0752 03/03/13 1150  GLUCAP 146* 122* 148*    Scheduled Meds: . amLODipine  10 mg Oral QHS  . antiseptic oral rinse  15 mL Mouth Rinse QID  . aspirin EC  81 mg Oral Daily  . calcium acetate (Phos Binder)  667 mg Oral TID WC  . chlorhexidine  15 mL Mouth Rinse BID  . cloNIDine  0.1 mg Oral TID  . darbepoetin (ARANESP) injection - DIALYSIS  100 mcg Intravenous Q Wed-HD  . docusate sodium  100 mg Oral BID  . feeding supplement (RESOURCE BREEZE)  1 Container Oral BID BM  . ferric gluconate (FERRLECIT/NULECIT) IV  62.5 mg Intravenous Weekly  . FLUoxetine  5 mg Oral Daily  . influenza vac split quadrivalent PF  0.5 mL Intramuscular Tomorrow-1000  . insulin aspart  0-9 Units Subcutaneous Q4H  . levothyroxine  112 mcg Oral QAC breakfast  . metoprolol  75 mg Oral BID  . multivitamin  1 tablet Oral QHS  . pantoprazole (PROTONIX) IV  40 mg Intravenous Daily  . simvastatin  5 mg Oral q1800  . sodium chloride  3 mL Intravenous Q12H  . temazepam  15 mg Oral Custom  . THROMBI-PAD  1 each Topical Once    Continuous Infusions: . sodium chloride 20 mL/hr at 03/02/13 1056  . dextrose 5 % and 0.45% NaCl 50 mL/hr at 03/03/13 1300  . propofol Stopped (03/03/13 0840)    Maureen Chatters, RD, LDN Pager #: [phone] After-Hours Pager #: [phone]

## 2013-03-03 NOTE — Transfer of Care (Signed)
Immediate Anesthesia Transfer of Care Note  Patient: Marcus [name]  Procedure(s) Performed: Procedure(s): LIGATION Left ARTERIOVENOUS GORTEX Thigh GRAFT (Left) INSERTION OF DIALYSIS CATHETER (Right)  Patient Location: ICU  Anesthesia Type:General  Level of Consciousness: sedated and Patient remains intubated per anesthesia plan  Airway & Oxygen Therapy: Patient remains intubated per anesthesia plan and Patient placed on Ventilator (see vital sign flow sheet for setting)  Post-op Assessment: Report given to PACU RN and Post -op Vital signs reviewed and stable  Post vital signs: Reviewed and stable  Complications: No apparent anesthesia complications

## 2013-03-03 NOTE — Preoperative (Signed)
Beta Blockers   Reason not to administer Beta Blockers:Not Applicable, given this pm per RN in report

## 2013-03-03 NOTE — Progress Notes (Signed)
Results for SAYER, MASINI (MRN [id number]) as of 03/03/2013 11:51  Ref. Range 03/02/2013 07:27 03/02/2013 07:48 03/02/2013 11:08 03/02/2013 16:27 03/02/2013 21:30 03/02/2013 23:53 03/03/2013 00:15 03/03/2013 00:28 03/03/2013 00:48 03/03/2013 04:19 03/03/2013 07:52  Glucose-Capillary Latest Range: 70-99 mg/dL 28 (LL) 73 409 (H) 811 (H) 112 (H)  19 (LL) 60 (L) 149 (H) 218 (H) 146 (H) 122 (H)   CBG's have been labile.  Yesterday, patient only received 5 units total of Novolog and CBG dropped to 19 last PM.  Patient was on Lantus bid prior to admit (a total of 22 units).  May consider consulting Dr. Lucianne Muss since he managed patients diabetes prior to admission for recommendations.   Consider reducing correction Novolog to very sensitive scale starting at 151-200 mg/dL-1 unit, 914-782 mg/dL-2 units, 956-213 mg/dL-3 units, 086-578 mg/dL-4 units, 469-629-5 units, greater than 401 mg/dl-Call MD.   Thanks, Beryl Meager, RN, BC-ADM Inpatient Diabetes Coordinator Pager [phone]

## 2013-03-03 NOTE — Progress Notes (Signed)
[facility] PROGRESS NOTE  Marcus [name] ZOX:[id number] DOB: 1956-03-26 DOA: 02/25/2013 PCP: Galvin Proffer, MD  Brief narrative 57 y.o. male with h/o ESRD (HD MWF) who presents to [facility] with worsening R arm swelling over the past week. His current HD access is in his L thigh and unrelated to this. Patient had a recent  attempt at thyroidectomy performed last month by Dr. Gerrit Friends. 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  In the ED at [facility] he was found to have not only RUE DVT but thrombus in his R IJ as well. The enlargement of the thyroid appears to be extending down into his thoracic cavity (unchanged since prior CT). 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  [facility] was thus transferred [facility].   Assessment/Plan:  Rt IJ thrombus  placed on heparin drip. Possible from direct compression of IJ by enlarged thyroid. Appreciate consultation by general surgery, vascular surgery and interventional radiology. On IV heparin drip. Recommend IV heparin and start on coumadin. Ligation of right AV graft done last week  Left thigh graft bleed: Evidence reviewed for the last 24 hours. Greatly appreciate vascular surgery and critical care help. Status post ligation of left thigh graft and placement of right leg tunnel catheter.   Thyroid mass  Thyroiditis per recent bx. appreciate surgery help. We'll also discuss with endocrinology as to nonsurgical options to decrease thyroid size. CT reports unchanged airway compression. patient in no resp distress. does c/o some new dysphasia which ceased therapy has seen and put on dysphagia 3 diet  ESRD on HD ( M,W,F)  renal following .blood from left thigh graft. See above. Comment catheter placed on right leg   Type 1 DM  Was having  symptomatic hypoglycemia yesterday so stopped Levemir. More hypoglycemia last night. Appreciate diabetes management assistance. Had changed customized very low sliding scale  HTN  continue home meds   Hx of CVA  continue ASA, statin   GERD  continue PPI   DIET: dys level 3   DVT prophylaxis: Anticoagulation on hold today. Restart tomorrow  Code Status: full code  Family Communication: Left message with family Disposition Plan: pending    Consultants:  CCS  vascular surgery ( Dr Darrick Penna)  IR Critical care   Procedures:  For right IJ thrombus ligation done 11/13 Ligation of left AV graft secondary to bleed done 11/17. Placement of tunneled catheter on right leg 11/17  Antibiotics:  None  HPI/Subjective:  Patient doing okay. Tired. No major complaints right now. Currently receiving dialysis   Objective: Filed Vitals:   03/03/13 1645  BP: 136/73  Pulse: 52  Temp:   Resp: 9    Intake/Output Summary (Last 24 hours) at 03/03/13 1702 Last data filed at 03/03/13 1600  Gross per 24 hour  Intake 2854.64 ml  Output    125 ml  Net 2729.64 ml   Filed Weights   02/28/13 2037 03/01/13 2131 03/02/13 2132  Weight: 64 kg (141 lb 1.5 oz) 64 kg (141 lb 1.5 oz) 64 kg (141 lb 1.5 oz)    Exam: General: alert and oriented x2, no acute distress HEENT: no pallor, moist mucosa, enlarged thyroid  Chest: Labored breathing, but lungs clear auscultation bilaterally  CVS: Regular rate and rhythm, S1-S2 Abd: Soft,  nontender, nondistended, positive bowel sounds  Ext: swollen rt arm and forearm , tunneled catheter-right leg   Data Reviewed: Basic Metabolic Panel:  Recent Labs Lab 02/26/13 1326 02/27/13 0500 02/28/13 0840 03/02/13 0835 03/03/13 0425 03/03/13 1500  NA 128* 132* 127* 128* 129* 129*  K 4.7 3.5 4.5 5.3* 4.5 5.0  CL 88* 93* 88* 93* 92* 92*  CO2 24 26 24 22 24 24   GLUCOSE 176* 36* 171* 60* 125* 169*  BUN 41* 18 29* 29* 39* 42*  CREATININE 7.10* 4.10* 5.38*  4.90* 6.02* 6.85*  CALCIUM 8.6 8.6 8.8 9.2 8.5 8.6  PHOS 4.1  --  3.7  --   --  3.9   Liver Function Tests:  Recent Labs Lab 02/26/13 1326 02/28/13 0840 03/03/13 1500  ALBUMIN 2.9* 3.1* 2.6*   No results found for this basename: LIPASE, AMYLASE,  in the last 168 hours No results found for this basename: AMMONIA,  in the last 168 hours CBC:  Recent Labs Lab 02/28/13 0655 03/01/13 0556 03/02/13 0835 03/03/13 0045 03/03/13 0425 03/03/13 1525  WBC 6.0 6.4 7.4  --  8.1 4.2  HGB 10.5* 10.4* 10.9* 7.6* 8.0* 7.6*  HCT 33.0* 32.5* 33.8* 24.1* 23.2* 23.4*  MCV 95.4 95.6 94.9  --  92.8 92.5  PLT 240 258 250  --  178 160   Cardiac Enzymes: No results found for this basename: CKTOTAL, CKMB, CKMBINDEX, TROPONINI,  in the last 168 hours BNP (last 3 results) No results found for this basename: PROBNP,  in the last 8760 hours CBG:  Recent Labs Lab 03/03/13 0048 03/03/13 0419 03/03/13 0752 03/03/13 1150 03/03/13 1536  GLUCAP 218* 146* 122* 148* 158*    Recent Results (from the past 240 hour(s))  MRSA PCR SCREENING     Status: None   Collection Time    02/25/13  1:36 AM      Result Value Range Status   MRSA by PCR NEGATIVE  NEGATIVE Final   Comment:            The GeneXpert MRSA Assay (FDA     approved for NASAL specimens     only), is one component of a     comprehensive MRSA colonization     surveillance program. It is not     intended to diagnose MRSA     infection nor to guide or     monitor treatment for     MRSA infections.     Studies: Portable Chest Xray  03/03/2013   CLINICAL DATA:  Endotracheal tube placement.  EXAM: PORTABLE CHEST - 1 VIEW  COMPARISON:  02/24/2013  FINDINGS: Endotracheal tube ends 3 cm above the carina. Gastric suction tube side port ends in the distal esophageal region, approximately 6 cm above the carina. Dialysis catheter, split tip, from below, with tips terminating in the right atrial region. The medial tip it is directed towards the  atrioventricular valve.  Unchanged appearance of right brachiocephalic vein stenting, with wasting cranially.  Mild lower lung atelectasis.  Normal heart size. Widened upper mediastinum related to known, large goiter.  For discussion at am rounds, these results were called by telephone at the time of interpretation on 03/03/2013 at 4:04 AM to RN Puja, who verbally acknowledged these results.  IMPRESSION: 1. Endotracheal tube in good position. 2. Mildly short nasogastric tube, with side port in the distal esophageal region. Advancement by 5 cm would provide more optimal positioning. 3. Dialysis catheter from below is redundant, with 1 of  the tips in the region of the atrioventricular valve.   Electronically Signed   By: Tiburcio Pea M.D.   On: 03/03/2013 04:06   Dg Fluoro Guide Cv Line-no Report  03/03/2013   CLINICAL DATA: Bleeding Thigh Graft   FLOURO GUIDE CV LINE  Fluoroscopy was utilized by the requesting physician.  No radiographic  interpretation.     Scheduled Meds: . amLODipine  10 mg Oral QHS  . antiseptic oral rinse  15 mL Mouth Rinse QID  . aspirin EC  81 mg Oral Daily  . calcium acetate (Phos Binder)  667 mg Oral TID WC  . chlorhexidine  15 mL Mouth Rinse BID  . cloNIDine  0.1 mg Oral TID  . darbepoetin (ARANESP) injection - DIALYSIS  100 mcg Intravenous Q Wed-HD  . docusate sodium  100 mg Oral BID  . feeding supplement (RESOURCE BREEZE)  1 Container Oral BID BM  . ferric gluconate (FERRLECIT/NULECIT) IV  62.5 mg Intravenous Weekly  . FLUoxetine  5 mg Oral Daily  . influenza vac split quadrivalent PF  0.5 mL Intramuscular Tomorrow-1000  . insulin aspart  1-5 Units Subcutaneous TID WC  . levothyroxine  112 mcg Oral QAC breakfast  . metoprolol  75 mg Oral BID  . multivitamin  1 tablet Oral QHS  . pantoprazole (PROTONIX) IV  40 mg Intravenous Daily  . simvastatin  5 mg Oral q1800  . sodium chloride  3 mL Intravenous Q12H  . temazepam  15 mg Oral Custom  . THROMBI-PAD  1 each  Topical Once   Continuous Infusions: . sodium chloride 20 mL/hr at 03/02/13 1056  . dextrose 5 % and 0.45% NaCl 50 mL/hr at 03/03/13 1600  . propofol Stopped (03/03/13 0840)      Time spent: 20 minutes    [name]  [facility] Pager [phone] If 7PM-7AM, please contact night-coverage at [email or web address], password [facility] 03/03/2013, 5:02 PM  LOS: 6 days

## 2013-03-03 NOTE — Anesthesia Procedure Notes (Signed)
Procedure Name: Intubation Date/Time: 03/03/2013 1:20 AM Performed by: Jennifr Gaeta S Pre-anesthesia Checklist: Patient identified, Timeout performed, Emergency Drugs available, Suction available and Patient being monitored Patient Re-evaluated:Patient Re-evaluated prior to inductionOxygen Delivery Method: Circle system utilized Preoxygenation: Pre-oxygenation with 100% oxygen Intubation Type: IV induction and Rapid sequence Ventilation: Mask ventilation without difficulty Laryngoscope Size: Mac and 4 Grade View: Grade I Tube type: Oral Tube size: 7.5 mm Number of attempts: 1 Airway Equipment and Method: Stylet Placement Confirmation: ETT inserted through vocal cords under direct vision,  breath sounds checked- equal and bilateral and positive ETCO2 Secured at: 23 cm Tube secured with: Tape Dental Injury: Teeth and Oropharynx as per pre-operative assessment

## 2013-03-04 ENCOUNTER — Encounter (INDEPENDENT_AMBULATORY_CARE_PROVIDER_SITE_OTHER): Payer: Self-pay

## 2013-03-04 DIAGNOSIS — E43 Unspecified severe protein-calorie malnutrition: Secondary | ICD-10-CM

## 2013-03-04 DIAGNOSIS — E039 Hypothyroidism, unspecified: Secondary | ICD-10-CM

## 2013-03-04 DIAGNOSIS — I10 Essential (primary) hypertension: Secondary | ICD-10-CM

## 2013-03-04 LAB — PREPARE FRESH FROZEN PLASMA: Unit division: 0

## 2013-03-04 LAB — GLUCOSE, CAPILLARY
Glucose-Capillary: 167 mg/dL — ABNORMAL HIGH (ref 70–99)
Glucose-Capillary: 170 mg/dL — ABNORMAL HIGH (ref 70–99)
Glucose-Capillary: 235 mg/dL — ABNORMAL HIGH (ref 70–99)
Glucose-Capillary: 236 mg/dL — ABNORMAL HIGH (ref 70–99)
Glucose-Capillary: 291 mg/dL — ABNORMAL HIGH (ref 70–99)

## 2013-03-04 LAB — BASIC METABOLIC PANEL
BUN: 23 mg/dL (ref 6–23)
Calcium: 8.8 mg/dL (ref 8.4–10.5)
Chloride: 92 mEq/L — ABNORMAL LOW (ref 96–112)
GFR calc non Af Amer: 12 mL/min — ABNORMAL LOW (ref >90)
Glucose, Bld: 255 mg/dL — ABNORMAL HIGH (ref 70–99)
Potassium: 4.1 mEq/L (ref 3.5–5.1)
Sodium: 128 mEq/L — ABNORMAL LOW (ref 135–145)

## 2013-03-04 LAB — CBC WITH DIFFERENTIAL/PLATELET
Basophils Absolute: 0 10*3/uL (ref 0.0–0.1)
Eosinophils Absolute: 0.4 10*3/uL (ref 0.0–0.7)
Hemoglobin: 8.2 g/dL — ABNORMAL LOW (ref 13.0–17.0)
Lymphocytes Relative: 19 % (ref 12–46)
Lymphs Abs: 1 10*3/uL (ref 0.7–4.0)
Neutro Abs: 3 10*3/uL (ref 1.7–7.7)
Neutrophils Relative %: 60 % (ref 43–77)
Platelets: 168 10*3/uL (ref 150–400)
RBC: 2.68 MIL/uL — ABNORMAL LOW (ref 4.22–5.81)
WBC: 5 10*3/uL (ref 4.0–10.5)

## 2013-03-04 LAB — PROTIME-INR: INR: 2.44 — ABNORMAL HIGH (ref 0.00–1.49)

## 2013-03-04 MED ORDER — ALTEPLASE 2 MG IJ SOLR
2.0000 mg | Freq: Once | INTRAMUSCULAR | Status: AC | PRN
  Filled 2013-03-04: qty 2

## 2013-03-04 MED ORDER — CLONIDINE HCL 0.2 MG PO TABS
0.2000 mg | ORAL_TABLET | Freq: Three times a day (TID) | ORAL | Status: DC
  Administered 2013-03-04 – 2013-03-11 (×16): 0.2 mg via ORAL
  Filled 2013-03-04 (×24): qty 1
  Filled 2013-03-04: qty 2

## 2013-03-04 MED ORDER — LIDOCAINE HCL (PF) 1 % IJ SOLN: 5.0000 mL | INTRAMUSCULAR | Status: DC | PRN

## 2013-03-04 MED ORDER — HEPARIN SODIUM (PORCINE) 1000 UNIT/ML DIALYSIS
1000.0000 [IU] | INTRAMUSCULAR | Status: DC | PRN
  Filled 2013-03-04: qty 1

## 2013-03-04 MED ORDER — SODIUM CHLORIDE 0.9 % IV SOLN: 100.0000 mL | INTRAVENOUS | Status: DC | PRN

## 2013-03-04 MED ORDER — SODIUM CHLORIDE 0.9 % IV SOLN
62.5000 mg | INTRAVENOUS | Status: DC
  Administered 2013-03-05: 62.5 mg via INTRAVENOUS
  Filled 2013-03-04 (×3): qty 5

## 2013-03-04 MED ORDER — NEPRO/CARBSTEADY PO LIQD: 237.0000 mL | ORAL | Status: DC | PRN

## 2013-03-04 MED ORDER — INSULIN GLARGINE 100 UNIT/ML ~~LOC~~ SOLN
9.0000 [IU] | Freq: Once | SUBCUTANEOUS | Status: AC
  Administered 2013-03-04: 9 [IU] via SUBCUTANEOUS
  Filled 2013-03-04: qty 0.09

## 2013-03-04 MED ORDER — LIDOCAINE-PRILOCAINE 2.5-2.5 % EX CREA: 1.0000 "application " | TOPICAL_CREAM | CUTANEOUS | Status: DC | PRN

## 2013-03-04 MED ORDER — PENTAFLUOROPROP-TETRAFLUOROETH EX AERO: 1.0000 "application " | INHALATION_SPRAY | CUTANEOUS | Status: DC | PRN

## 2013-03-04 MED ORDER — INSULIN ASPART 100 UNIT/ML ~~LOC~~ SOLN
5.0000 [IU] | Freq: Once | SUBCUTANEOUS | Status: AC
  Administered 2013-03-04: 5 [IU] via SUBCUTANEOUS

## 2013-03-04 MED ORDER — INSULIN GLARGINE 100 UNIT/ML ~~LOC~~ SOLN
10.0000 [IU] | Freq: Every day | SUBCUTANEOUS | Status: DC
  Filled 2013-03-04: qty 0.1

## 2013-03-04 MED ORDER — PANTOPRAZOLE SODIUM 40 MG PO TBEC
40.0000 mg | DELAYED_RELEASE_TABLET | Freq: Every day | ORAL | Status: DC
  Administered 2013-03-05 – 2013-03-11 (×7): 40 mg via ORAL
  Filled 2013-03-04 (×7): qty 1

## 2013-03-04 MED ORDER — FLUOXETINE HCL 20 MG PO CAPS
20.0000 mg | ORAL_CAPSULE | Freq: Every day | ORAL | Status: DC
  Administered 2013-03-05 – 2013-03-11 (×7): 20 mg via ORAL
  Filled 2013-03-04 (×8): qty 1

## 2013-03-04 NOTE — Plan of Care (Signed)
Problem: Phase III Progression Outcomes Goal: Voiding independently Outcome: Not Applicable Date Met:  03/04/13 Dialysis today 11/17, pulled 3L off, has not voided today.

## 2013-03-04 NOTE — Progress Notes (Signed)
[facility] Progress Note  Subjective:   Stable today, no further bleeding, Hb 8.2 today. Up in chair, c/o swollen arms  Objective Filed Vitals:   03/04/13 0743 03/04/13 0800 03/04/13 0900 03/04/13 0916  BP:  135/62 182/82 182/82  Pulse:  59 65 62  Temp: 98.1 F (36.7 C)     TempSrc: Oral     Resp:      Height:      Weight:      SpO2:  100% 98%    Physical Exam General: alert, no distress Heart: RRR, no murmur or rub Lungs: clear bilat Abdomen: Soft, NT, non-distended, normal BS Extremities: R > L upper ext edema, mild dependent hip edema bilat Dialysis Access: New rt fem TDC. (RFA AVG ligated, L thigh AVG ligated)  Dialysis Orders: [facility] MWF  4hr 15 min    61kg    2K/2.25Ca   Heparin 2400    R thigh cath Hectorol 0 (pth 56)   Epogen 6800   Venofer 50mg  per week    Assessment/Plan: 1. RUE edema/ RUE DVT (IJ, axillary, brachiocephalic veins) - R arm AVG ligated 11/13 due to venous HTN, resuming AC soon per primary 2. L thigh AVGbleed- ligated 11/17 3. ESRD, HD on Wed and use new L thigh catheter 4. HTN/volume excess- up 5-6 kg, on 3 bp meds, max UF w HD tomorrow 5. Anemia - Hgb  8.0 > 7.6  S/p 2 units PRBCs overnight. Continue Aranesp 100 q Wed. Weekly Fe on Tuesdays. Transfuse PRN  6. Metabolic bone disease - Ca and phos controlled. Phoslyra here due to swallowing difficulty. No Vit D. Renal panel pending. 7. Goiter - Dr. Gerrit Friends managing, not a surgical candidate 8. DM Type 1 - per primary 9. Dispo - Lives at home. Will need a caregiver and INR follow up with PCP. Rx for Phoslyra 5ml ac if still having trouble swallowing  [name] MD pager [phone]    cell [phone] 03/04/2013, 10:26 AM     Additional Objective Labs: Basic Metabolic Panel:  Recent Labs Lab 02/26/13 1326  02/28/13 0840  03/03/13 0425 03/03/13 1500 03/04/13 0822  NA 128*  < > 127*  < > 129* 129* 128*  K 4.7  < > 4.5  < > 4.5 5.0 4.1  CL 88*  < > 88*  < > 92* 92* 92*   CO2 24  < > 24  < > 24 24 25   GLUCOSE 176*  < > 171*  < > 125* 169* 255*  BUN 41*  < > 29*  < > 39* 42* 23  CREATININE 7.10*  < > 5.38*  < > 6.02* 6.85* 4.77*  CALCIUM 8.6  < > 8.8  < > 8.5 8.6 8.8  PHOS 4.1  --  3.7  --   --  3.9  --   < > = values in this interval not displayed. Liver Function Tests:  Recent Labs Lab 02/26/13 1326 02/28/13 0840 03/03/13 1500  ALBUMIN 2.9* 3.1* 2.6*   No results found for this basename: LIPASE, AMYLASE,  in the last 168 hours CBC:  Recent Labs Lab 03/01/13 0556 03/02/13 0835  03/03/13 0425 03/03/13 1525 03/04/13 0822  WBC 6.4 7.4  --  8.1 4.2 5.0  NEUTROABS  --   --   --   --   --  3.0  HGB 10.4* 10.9*  < > 8.0* 7.6* 8.2*  HCT 32.5* 33.8*  < > 23.2* 23.4* 24.9*  MCV 95.6  94.9  --  92.8 92.5 92.9  PLT 258 250  --  178 160 168  < > = values in this interval not displayed. Blood Culture    Component Value Date/Time   SDES WOUND LEFT THIGH GRAFT 03/03/2013 0246   SDES WOUND LEFT THIGH GRAFT 03/03/2013 0246   SPECREQUEST NONE PT ON VANCOMYCIN 03/03/2013 0246   SPECREQUEST PT ON VANCOMYCIN 03/03/2013 0246   CULT  Value: NO GROWTH 1 DAY Performed at [facility] 03/03/2013 0246   CULT PENDING 03/03/2013 0246   REPTSTATUS PENDING 03/03/2013 0246   REPTSTATUS PENDING 03/03/2013 0246    Cardiac Enzymes: No results found for this basename: CKTOTAL, CKMB, CKMBINDEX, TROPONINI,  in the last 168 hours CBG:  Recent Labs Lab 03/03/13 1536 03/03/13 1927 03/03/13 2357 03/04/13 0432 03/04/13 0741  GLUCAP 158* 106* 291* 377* 236*   Iron Studies: No results found for this basename: IRON, TIBC, TRANSFERRIN, FERRITIN,  in the last 72 hours @lablastinr3 @ Studies/Results: Portable Chest Xray  03/03/2013   CLINICAL DATA:  Endotracheal tube placement.  EXAM: PORTABLE CHEST - 1 VIEW  COMPARISON:  02/24/2013  FINDINGS: Endotracheal tube ends 3 cm above the carina. Gastric suction tube side port ends in the distal esophageal region,  approximately 6 cm above the carina. Dialysis catheter, split tip, from below, with tips terminating in the right atrial region. The medial tip it is directed towards the atrioventricular valve.  Unchanged appearance of right brachiocephalic vein stenting, with wasting cranially.  Mild lower lung atelectasis.  Normal heart size. Widened upper mediastinum related to known, large goiter.  For discussion at am rounds, these results were called by telephone at the time of interpretation on 03/03/2013 at 4:04 AM to RN Puja, who verbally acknowledged these results.  IMPRESSION: 1. Endotracheal tube in good position. 2. Mildly short nasogastric tube, with side port in the distal esophageal region. Advancement by 5 cm would provide more optimal positioning. 3. Dialysis catheter from below is redundant, with 1 of the tips in the region of the atrioventricular valve.   Electronically Signed   By: Tiburcio Pea M.D.   On: 03/03/2013 04:06   Dg Fluoro Guide Cv Line-no Report  03/03/2013   CLINICAL DATA: Bleeding Thigh Graft   FLOURO GUIDE CV LINE  Fluoroscopy was utilized by the requesting physician.  No radiographic  interpretation.    Medications: . sodium chloride 20 mL/hr at 03/02/13 1056   . amLODipine  10 mg Oral QHS  . antiseptic oral rinse  15 mL Mouth Rinse QID  . aspirin EC  81 mg Oral Daily  . calcium acetate (Phos Binder)  667 mg Oral TID WC  . cloNIDine  0.2 mg Oral TID  . darbepoetin (ARANESP) injection - DIALYSIS  100 mcg Intravenous Q Wed-HD  . docusate sodium  100 mg Oral BID  . feeding supplement (RESOURCE BREEZE)  1 Container Oral BID BM  . ferric gluconate (FERRLECIT/NULECIT) IV  62.5 mg Intravenous Weekly  . FLUoxetine  20 mg Oral Daily  . influenza vac split quadrivalent PF  0.5 mL Intramuscular Tomorrow-1000  . insulin aspart  1-5 Units Subcutaneous TID WC  . insulin glargine  9 Units Subcutaneous Once  . levothyroxine  112 mcg Oral QAC breakfast  . metoprolol  75 mg Oral BID  .  multivitamin  1 tablet Oral QHS  . pantoprazole  40 mg Oral Daily  . simvastatin  5 mg Oral q1800  . sodium chloride  3 mL Intravenous Q12H  .  temazepam  15 mg Oral Custom  . THROMBI-PAD  1 each Topical Once

## 2013-03-04 NOTE — Progress Notes (Signed)
Inpatient Diabetes Program Recommendations  AACE/ADA: New Consensus Statement on Inpatient Glycemic Control (2013)  Target Ranges:  Prepandial:   less than 140 mg/dL      Peak postprandial:   less than 180 mg/dL (1-2 hours)      Critically ill patients:  140 - 180 mg/dL   Reason for Visit: Results for DALLIN, MCCORKEL (MRN [id number]) as of 03/04/2013 10:13  Ref. Range 03/03/2013 15:36 03/03/2013 19:27 03/03/2013 23:57 03/04/2013 04:32 03/04/2013 07:41  Glucose-Capillary Latest Range: 70-99 mg/dL 782 (H) 956 (H) 213 (H) 377 (H) 236 (H)   Note that Lantus 9 units restarted this AM.  Patient is doing much better on very sensitive correction scale.  Will follow.  Beryl Meager, RN, BC-ADM Inpatient Diabetes Coordinator Pager [phone]

## 2013-03-04 NOTE — Progress Notes (Signed)
Patient transferred via wheelchair to room 6E11 with belongings.  Patient A&Ox4 and VSS.  Ambulated 121ft with wheelchair support on way to his new room.  Left with nurse in room comfortable in bed.  Transfer tolerated well by patient.  Tommi Emery

## 2013-03-04 NOTE — Progress Notes (Signed)
[facility] PROGRESS NOTE  Marcus [name] LEX:[id number] DOB: [birth year] DOA: 02/25/2013 PCP: Galvin Proffer, MD  Brief narrative 57 y.o. male with h/o ESRD (HD MWF) who presents to [facility] with worsening R arm swelling over the past week. His current HD access is in his L thigh and unrelated to this. Patient had a recent  attempt at thyroidectomy performed last month by Dr. Gerrit Friends. 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   In the ED at [facility] he was found to have not only RUE DVT but thrombus in his R IJ as well. The enlargement of the thyroid appears to be extending down into his thoracic cavity (unchanged since prior CT). 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  [facility].  Patient was seen by vascular surgery and underwent ligation of right forearm AV graft. Post procedure he was tolerating this well and started on Coumadin when on the evening of 11/16, patient started having significant bleeding from the left thigh AV graft. Patient was taken emergently to the OR where he underwent ligation of this graft. In addition and underwent right femoral vein cannulation and tunneled dialysis catheter was placed. He was monitored in the surgical ICU on 11/17-18 and then transferred back to nephrology floor.    Assessment/Plan:  Rt IJ thrombus  placed on heparin drip. Possible from direct compression of IJ by enlarged thyroid. Appreciate consultation by general surgery, vascular surgery and interventional radiology. On IV heparin drip. Recommend IV heparin and start on coumadin. Ligation of right AV graft done last week  Left thigh graft bleed: Evidence reviewed for the last 24 hours. Greatly appreciate vascular surgery and critical care help. Status  post ligation of left thigh graft and placement of right leg tunnel catheter.   Thyroid mass  Thyroiditis per recent bx. appreciate surgery help. At this time, given patient's ability to still swallow and other comorbidities, surgical resection  Is recommended to only be a last ditch option.   Need to discuss with endocrinology as to nonsurgical options to decrease thyroid size. CT reports unchanged airway compression. patient in no resp distress. does c/o some new dysphasia which ceased therapy has seen and put on dysphagia 3 diet  ESRD on HD ( M,W,F)  renal following .blood from left thigh graft. See above. Access catheter placed on right leg   Type 1 DM  Was having symptomatic hypoglycemia yesterday so stopped Levemir. More hypoglycemia last night. Appreciate diabetes management assistance. Had changed customized very low sliding scale on 11/17 and CBGs have been persistently higher in the 200s since then. As per diabetes recommendations, Lantus 9 units daily started this morning  HTN  continue home meds   Hx of CVA  continue ASA, statin   GERD  continue PPI   DIET: dys level 3   DVT prophylaxis: Heparin restarted today today.  Code Status: full code  Family Communication: Left message with family Disposition Plan: pending    Consultants:  [facility]  vascular surgery ( Dr Darrick Penna)  IR Critical care   Procedures:  For right IJ thrombus ligation done 11/13 Ligation of left AV graft secondary to bleed done 11/17. Placement of tunneled catheter on right leg 11/17  Antibiotics:  None  HPI/Subjective:  Patient tired.  Seen prior to transfer back to nephrology floor. No complaints   Objective: Filed Vitals:   03/04/13 1200  BP: 157/81  Pulse: 56  Temp:   Resp: 20    Intake/Output Summary (Last 24 hours) at 03/04/13 1642 Last data filed at 03/04/13 1030  Gross per 24 hour  Intake    940 ml  Output   3000 ml  Net  -2060 ml   Filed Weights   03/01/13 2131 03/02/13  2132 03/04/13 0600  Weight: 64 kg (141 lb 1.5 oz) 64 kg (141 lb 1.5 oz) 67 kg (147 lb 11.3 oz)    Exam: General: alert and oriented x2, no acute distress HEENT: no pallor, moist mucosa, enlarged thyroid  Chest: Labored breathing, but lungs clear auscultation bilaterally  CVS: Regular rate and rhythm, S1-S2 Abd: Soft, nontender, nondistended, positive bowel sounds  Ext: swollen rt arm and forearm , tunneled catheter-right leg   Data Reviewed: Basic Metabolic Panel:  Recent Labs Lab 02/26/13 1326  02/28/13 0840 03/02/13 0835 03/03/13 0425 03/03/13 1500 03/04/13 0822  NA 128*  < > 127* 128* 129* 129* 128*  K 4.7  < > 4.5 5.3* 4.5 5.0 4.1  CL 88*  < > 88* 93* 92* 92* 92*  CO2 24  < > 24 22 24 24 25   GLUCOSE 176*  < > 171* 60* 125* 169* 255*  BUN 41*  < > 29* 29* 39* 42* 23  CREATININE 7.10*  < > 5.38* 4.90* 6.02* 6.85* 4.77*  CALCIUM 8.6  < > 8.8 9.2 8.5 8.6 8.8  PHOS 4.1  --  3.7  --   --  3.9  --   < > = values in this interval not displayed. Liver Function Tests:  Recent Labs Lab 02/26/13 1326 02/28/13 0840 03/03/13 1500  ALBUMIN 2.9* 3.1* 2.6*   No results found for this basename: LIPASE, AMYLASE,  in the last 168 hours No results found for this basename: AMMONIA,  in the last 168 hours CBC:  Recent Labs Lab 03/01/13 0556 03/02/13 0835 03/03/13 0045 03/03/13 0425 03/03/13 1525 03/04/13 0822  WBC 6.4 7.4  --  8.1 4.2 5.0  NEUTROABS  --   --   --   --   --  3.0  HGB 10.4* 10.9* 7.6* 8.0* 7.6* 8.2*  HCT 32.5* 33.8* 24.1* 23.2* 23.4* 24.9*  MCV 95.6 94.9  --  92.8 92.5 92.9  PLT 258 250  --  178 160 168   Cardiac Enzymes: No results found for this basename: CKTOTAL, CKMB, CKMBINDEX, TROPONINI,  in the last 168 hours BNP (last 3 results) No results found for this basename: PROBNP,  in the last 8760 hours CBG:  Recent Labs Lab 03/03/13 1927 03/03/13 2357 03/04/13 0432 03/04/13 0741 03/04/13 1146  GLUCAP 106* 291* 377* 236* 235*    Recent Results  (from the past 240 hour(s))  MRSA PCR SCREENING     Status: None   Collection Time    02/25/13  1:36 AM      Result Value Range Status   MRSA by PCR NEGATIVE  NEGATIVE Final   Comment:            The GeneXpert MRSA Assay (FDA     approved for NASAL specimens     only), is one component of a     comprehensive MRSA colonization     surveillance program. It is not     intended to diagnose MRSA     infection nor  to guide or     monitor treatment for     MRSA infections.  WOUND CULTURE     Status: None   Collection Time    03/03/13  2:46 AM      Result Value Range Status   Specimen Description WOUND LEFT THIGH GRAFT   Final   Special Requests NONE PT ON VANCOMYCIN   Final   Gram Stain     Final   Value: NO WBC SEEN     NO SQUAMOUS EPITHELIAL CELLS SEEN     NO ORGANISMS SEEN     Performed at [facility]   Culture     Final   Value: NO GROWTH 1 DAY     Performed at [facility]   Report Status PENDING   Incomplete  ANAEROBIC CULTURE     Status: None   Collection Time    03/03/13  2:46 AM      Result Value Range Status   Specimen Description WOUND LEFT THIGH GRAFT   Final   Special Requests PT ON VANCOMYCIN   Final   Gram Stain     Final   Value: NO WBC SEEN     NO SQUAMOUS EPITHELIAL CELLS SEEN     NO ORGANISMS SEEN     Performed at [facility]   Culture     Final   Value: NO ANAEROBES ISOLATED; CULTURE IN PROGRESS FOR 5 DAYS     Performed at [facility]   Report Status PENDING   Incomplete     Studies: Portable Chest Xray  03/03/2013   CLINICAL DATA:  Endotracheal tube placement.  EXAM: PORTABLE CHEST - 1 VIEW  COMPARISON:  02/24/2013  FINDINGS: Endotracheal tube ends 3 cm above the carina. Gastric suction tube side port ends in the distal esophageal region, approximately 6 cm above the carina. Dialysis catheter, split tip, from below, with tips terminating in the right atrial region. The medial tip it is directed towards the  atrioventricular valve.  Unchanged appearance of right brachiocephalic vein stenting, with wasting cranially.  Mild lower lung atelectasis.  Normal heart size. Widened upper mediastinum related to known, large goiter.  For discussion at am rounds, these results were called by telephone at the time of interpretation on 03/03/2013 at 4:04 AM to RN Puja, who verbally acknowledged these results.  IMPRESSION: 1. Endotracheal tube in good position. 2. Mildly short nasogastric tube, with side port in the distal esophageal region. Advancement by 5 cm would provide more optimal positioning. 3. Dialysis catheter from below is redundant, with 1 of the tips in the region of the atrioventricular valve.   Electronically Signed   By: Tiburcio Pea M.D.   On: 03/03/2013 04:06   Dg Fluoro Guide Cv Line-no Report  03/03/2013   CLINICAL DATA: Bleeding Thigh Graft   FLOURO GUIDE CV LINE  Fluoroscopy was utilized by the requesting physician.  No radiographic  interpretation.     Scheduled Meds: . amLODipine  10 mg Oral QHS  . antiseptic oral rinse  15 mL Mouth Rinse QID  . aspirin EC  81 mg Oral Daily  . calcium acetate (Phos Binder)  667 mg Oral TID WC  . cloNIDine  0.2 mg Oral TID  . darbepoetin (ARANESP) injection - DIALYSIS  100 mcg Intravenous Q Wed-HD  . docusate sodium  100 mg Oral BID  . feeding supplement (RESOURCE BREEZE)  1 Container Oral BID BM  . [START ON 03/05/2013] ferric gluconate (  FERRLECIT/NULECIT) IV  62.5 mg Intravenous Q Wed-HD  . FLUoxetine  20 mg Oral Daily  . influenza vac split quadrivalent PF  0.5 mL Intramuscular Tomorrow-1000  . insulin aspart  1-5 Units Subcutaneous TID WC  . levothyroxine  112 mcg Oral QAC breakfast  . metoprolol  75 mg Oral BID  . multivitamin  1 tablet Oral QHS  . pantoprazole  40 mg Oral Daily  . simvastatin  5 mg Oral q1800  . sodium chloride  3 mL Intravenous Q12H  . temazepam  15 mg Oral Custom  . THROMBI-PAD  1 each Topical Once   Continuous  Infusions:      Time spent: 20 minutes    [name]  [facility] Pager [phone] If 7PM-7AM, please contact night-coverage at [email or web address], password [facility] 03/04/2013, 4:42 PM  LOS: 7 days

## 2013-03-04 NOTE — Progress Notes (Signed)
Name: Marcus [name] MRN: [id number] DOB: 05/01/1955    LOS: 7  Referring Provider:  Dr. Imogene Burn Reason for Referral:  Ventilatory management  PULMONARY / CRITICAL CARE MEDICINE   Brief patient description:  57 year-old with end-stage renal disease, DVTs and goiter, developed acute bleeding on full dose anticoagulation  Significant events/studies: 11/13 Ligation R forearm AV graft 11/17 Ligation left thigh AV graft, R femoral vein tunnled dialysis catheter placed, R femoral vein cannulation   Cultures:  Antibiotics  Tubes/Lines: 11/16 ETT >>11/17 11/16 R fem HD cath >>  Current Status: Extubated 11/17 without difficulty  Vital Signs: Temp:  [97.4 F (36.3 C)-98.5 F (36.9 C)] 98.1 F (36.7 C) (11/18 0743) Pulse Rate:  [52-89] 62 (11/18 0916) Resp:  [0-19] 19 (11/18 0600) BP: (119-200)/(59-112) 182/82 mmHg (11/18 0916) SpO2:  [98 %-100 %] 98 % (11/18 0900) Weight:  [67 kg (147 lb 11.3 oz)] 67 kg (147 lb 11.3 oz) (11/18 0600)  Physical Examination:  Gen: sitting up in chair, alert HEENT: NCAT, PERRL, EOMi PULM: CTA B,diminished bases CV: RRR, no mgr AB: BS +, nontender Ext: L femoral AV graft site dressing c/d/i, R femoral dialysis catheter in place; R arm swelling throughout Derm; no breakdown Neuro: awake and alert  Principal Problem:   Jugular vein thrombosis, right Active Problems:   ESRD (end stage renal disease) on dialysis   Type I (juvenile type) diabetes mellitus without mention of complication, uncontrolled   Thyroid goiter   DVT of axillary vein, acute right   Thyroid mass   Protein-calorie malnutrition, severe   ASSESSMENT AND PLAN  PULMONARY  Recent Labs Lab 03/03/13 0416  PHART 7.495*  PCO2ART 31.2*  PO2ART 226.0*  HCO3 23.8  O2SAT 98.6   Ventilator Settings:   CXR:  ET tube in good position ETT:  80  A:  Extubated without difficulty post op Mediastinal thyroid > 02/2013 CT images reviewed, minimal if any airway compromise  noted P:   -O2 as needed  RENAL  Recent Labs Lab 02/26/13 1326  02/28/13 0840 03/02/13 0835 03/03/13 0425 03/03/13 1500 03/04/13 0822  NA 128*  < > 127* 128* 129* 129* 128*  K 4.7  < > 4.5 5.3* 4.5 5.0 4.1  CL 88*  < > 88* 93* 92* 92* 92*  CO2 24  < > 24 22 24 24 25   BUN 41*  < > 29* 29* 39* 42* 23  CREATININE 7.10*  < > 5.38* 4.90* 6.02* 6.85* 4.77*  CALCIUM 8.6  < > 8.8 9.2 8.5 8.6 8.8  PHOS 4.1  --  3.7  --   --  3.9  --   < > = values in this interval not displayed. Intake/Output     11/17 0701 - 11/18 0700 11/18 0701 - 11/19 0700   P.O. 480 240   I.V. (mL/kg) 568.5 (8.5)    Blood     NG/GT 100    Total Intake(mL/kg) 1148.5 (17.1) 240 (3.6)   Emesis/NG output     Other 3000    Blood     Total Output 3000     Net -1851.5 +240        Urine Occurrence       A:  End-stage renal failure P:   HD MWF   CARDIOVASCULAR: A: AV fistula bleed on 11/16 > s/p vascular repair R IJ thrombus P: -post op care per vascular surgery -resume heparin today for thrombus  HEMATOLOGIC  Recent Labs Lab 02/28/13 0655 03/01/13 0556 03/02/13  1610 03/03/13 0045 03/03/13 0425 03/03/13 1525 03/04/13 0822  HGB 10.5* 10.4* 10.9* 7.6* 8.0* 7.6* 8.2*  HCT 33.0* 32.5* 33.8* 24.1* 23.2* 23.4* 24.9*  PLT 240 258 250  --  178 160 168  INR 1.38 1.93* 2.33*  --  1.96*  --  2.44*  APTT  --   --   --   --  109*  --   --    A:   Acute bleeding in the setting of full dose anticoagulation;  R IJ thrombus Hgb stable post vascular surgery P:  -restarting heparin per vascular protocol 11/17  Infectious A:  No acute issues P:  Endocrine: A: DM type I > glucose poorly controlled Large Goiter > inflammatory changes P: -resume lantus, start with 9 Units now, 10 tonight, then re adjust 11/19 as needed -continue SSI qAC -General surgery discussing goiter management with Endocrine  NEUROLOGIC  A:  No acute issues P:   D/c sedatives  BEST PRACTICE / DISPOSITION Level of Care:   ICU Primary Service:  [facility] Consultants:  Nephrology Code Status:  Full Diet:  Renal diet DVT Px:  Heparin gtt GI Px:  PPI to po Skin Integrity:  Good Social / Family:  No family at bedside at the time of visit   Yolonda Kida PCCM Pager: [phone] Cell: [phone] If no response, call [phone]   03/04/2013, 9:59 AM

## 2013-03-04 NOTE — Progress Notes (Signed)
      [facility]           [facility] [place]    Daily Progress Note  Assessment/Planning: POD #1 s/p Ligation of L thigh AVG, R TDC placement   No further active bleeding  Pt can resume anticoagulation  Pt has follow up with Dr. Edilia Bo for determination of next access option and follow up on R AVG ligation  Subjective  - 1 Day Post-Op  No complaints  Objective Filed Vitals:   03/04/13 0500 03/04/13 0600 03/04/13 0700 03/04/13 0743  BP: 146/67 125/59 144/65   Pulse: 60 57 57   Temp:    98.1 F (36.7 C)  TempSrc:    Oral  Resp: 10 19    Height:      Weight:  147 lb 11.3 oz (67 kg)    SpO2: 100% 99% 100%     Intake/Output Summary (Last 24 hours) at 03/04/13 0819 Last data filed at 03/03/13 2300  Gross per 24 hour  Intake 985.13 ml  Output   3000 ml  Net -2014.87 ml   VASC  L thigh: incision c/d/i, no bleeding from prior cannulation sites in L thigh  Laboratory CBC    Component Value Date/Time   WBC 4.2 03/03/2013 1525   HGB 7.6* 03/03/2013 1525   HCT 23.4* 03/03/2013 1525   PLT 160 03/03/2013 1525    BMET    Component Value Date/Time   NA 129* 03/03/2013 1500   K 5.0 03/03/2013 1500   CL 92* 03/03/2013 1500   CO2 24 03/03/2013 1500   GLUCOSE 169* 03/03/2013 1500   BUN 42* 03/03/2013 1500   CREATININE 6.85* 03/03/2013 1500   CALCIUM 8.6 03/03/2013 1500   CALCIUM 8.8 05/20/2008 0705   GFRNONAA 8* 03/03/2013 1500   GFRAA 9* 03/03/2013 1500    Leonides Sake, MD [facility] [place] Office: [phone] Pager: [phone]  03/04/2013, 8:19 AM

## 2013-03-05 LAB — CBC
HCT: 23.8 % — ABNORMAL LOW (ref 39.0–52.0)
Hemoglobin: 7.9 g/dL — ABNORMAL LOW (ref 13.0–17.0)
MCHC: 33.2 g/dL (ref 30.0–36.0)
Platelets: 166 10*3/uL (ref 150–400)
RBC: 2.6 MIL/uL — ABNORMAL LOW (ref 4.22–5.81)

## 2013-03-05 LAB — PROTIME-INR
INR: 1.85 — ABNORMAL HIGH (ref 0.00–1.49)
Prothrombin Time: 20.8 seconds — ABNORMAL HIGH (ref 11.6–15.2)

## 2013-03-05 LAB — GLUCOSE, CAPILLARY
Glucose-Capillary: 174 mg/dL — ABNORMAL HIGH (ref 70–99)
Glucose-Capillary: 109 mg/dL — ABNORMAL HIGH (ref 70–99)
Glucose-Capillary: 312 mg/dL — ABNORMAL HIGH (ref 70–99)

## 2013-03-05 LAB — WOUND CULTURE: Culture: NO GROWTH

## 2013-03-05 MED ORDER — HEPARIN SODIUM (PORCINE) 1000 UNIT/ML IJ SOLN
2400.0000 [IU] | Freq: Once | INTRAMUSCULAR | Status: AC
  Administered 2013-03-05: 2400 [IU] via INTRAVENOUS

## 2013-03-05 MED ORDER — SODIUM CHLORIDE 0.9 % IV SOLN: 100.0000 mL | INTRAVENOUS | Status: DC | PRN

## 2013-03-05 MED ORDER — HEPARIN SODIUM (PORCINE) 1000 UNIT/ML DIALYSIS
2400.0000 [IU] | Freq: Once | INTRAMUSCULAR | Status: DC
  Filled 2013-03-05: qty 3

## 2013-03-05 MED ORDER — WARFARIN - PHARMACIST DOSING INPATIENT
Freq: Every day | Status: DC
  Administered 2013-03-10: 18:00:00

## 2013-03-05 MED ORDER — HEPARIN SODIUM (PORCINE) 1000 UNIT/ML DIALYSIS
1000.0000 [IU] | INTRAMUSCULAR | Status: DC | PRN
  Filled 2013-03-05: qty 1

## 2013-03-05 MED ORDER — PENTAFLUOROPROP-TETRAFLUOROETH EX AERO: 1.0000 "application " | INHALATION_SPRAY | CUTANEOUS | Status: DC | PRN

## 2013-03-05 MED ORDER — ALTEPLASE 2 MG IJ SOLR
2.0000 mg | Freq: Once | INTRAMUSCULAR | Status: AC | PRN
  Filled 2013-03-05: qty 2

## 2013-03-05 MED ORDER — LIDOCAINE-PRILOCAINE 2.5-2.5 % EX CREA: 1.0000 "application " | TOPICAL_CREAM | CUTANEOUS | Status: DC | PRN

## 2013-03-05 MED ORDER — WARFARIN SODIUM 1 MG PO TABS
1.0000 mg | ORAL_TABLET | Freq: Once | ORAL | Status: AC
  Administered 2013-03-05: 1 mg via ORAL
  Filled 2013-03-05: qty 1

## 2013-03-05 MED ORDER — DARBEPOETIN ALFA-POLYSORBATE 100 MCG/0.5ML IJ SOLN
INTRAMUSCULAR | Status: AC
  Filled 2013-03-05: qty 0.5

## 2013-03-05 MED ORDER — LIDOCAINE HCL (PF) 1 % IJ SOLN: 5.0000 mL | INTRAMUSCULAR | Status: DC | PRN

## 2013-03-05 MED ORDER — NEPRO/CARBSTEADY PO LIQD: 237.0000 mL | ORAL | Status: DC | PRN

## 2013-03-05 NOTE — Progress Notes (Addendum)
ANTICOAGULATION CONSULT NOTE - Follow Up Consult  Pharmacy Consult for Coumadin  Indication: DVT (RUE) and right IJ thrombus  Allergies  Allergen Reactions  . Penicillins Other (See Comments)    Unknown reaction   Patient Measurements: Height: 6\' 2"  (188 cm) Weight: 152 lb 8.9 oz (69.2 kg) IBW/kg (Calculated) : 82.2 Heparin Dosing Weight: 62 kg Vital Signs: Temp: 98.1 F (36.7 C) (11/19 0920) Temp src: Oral (11/19 0920) BP: 159/84 mmHg (11/19 0930) Pulse Rate: 57 (11/19 0930) Labs:  Recent Labs  03/03/13 0425 03/03/13 1500 03/03/13 1525 03/04/13 0822 03/05/13 0915  HGB 8.0*  --  7.6* 8.2* 7.9*  HCT 23.2*  --  23.4* 24.9* 23.8*  PLT 178  --  160 168 166  APTT 109*  --   --   --   --   LABPROT 21.7*  --   --  25.7*  --   INR 1.96*  --   --  2.44*  --   CREATININE 6.02* 6.85*  --  4.77*  --     Estimated Creatinine Clearance: 16.7 ml/min (by C-G formula based on Cr of 4.77).    Assessment: 34 YOM with RUE DVT and R IJ thrombus.  Patient noted with left thigh graft bleeding on 11/16 and s/p ligation of L thigh AVG on 11/17. Currently, no evidence of bleeding per RN. INR was therapeutic at 2.44 yesterday. H/H 7.9/23.8, Plt 166. Per IM, okay to restart coumadin at low dose. Heparin has been d/c'ed.   Goal of Therapy:  INR 2-3 Monitor platelets by anticoagulation protocol: Yes   Plan:  -Coumadin 1 mg today  -Daily PT/INR -Monitor for s/s of bleeding   Vinnie Level, PharmD.  Clinical Pharmacist Pager [phone]

## 2013-03-05 NOTE — Progress Notes (Addendum)
[facility] Progress Note  Subjective:   Arms still swollen  Objective Filed Vitals:   03/05/13 1020 03/05/13 1030 03/05/13 1100 03/05/13 1130  BP: 151/75 152/80 173/82 149/79  Pulse: 59 61 64 59  Temp:      TempSrc:      Resp:      Height:      Weight:      SpO2:       Physical Exam General: alert, no distress Heart: RRR, no murmur or rub Lungs: clear bilat Abdomen: Soft, NT, non-distended, normal BS Extremities: R > L upper ext edema, mild dependent hip edema bilat Dialysis Access: New rt fem TDC. (RFA AVG ligated, L thigh AVG ligated)  Dialysis Orders: [facility] MWF  4hr 15 min    61kg    2K/2.25Ca   Heparin 2400    R thigh cath Hectorol 0 (pth 56)   Epogen 6800   Venofer 50mg  per week    Assessment/Plan: 1. RUE edema/ RUE DVT (IJ, axillary, brachiocephalic veins): R arm AVG ligated 11/13 due to venous HTN, hep/coumadin per primary 2. L thigh AVGbleed- ligated 11/17, cx's negative 3. ESRD: HD today 4. HTN/volume excess- remains 5-6 kg over, wt not declining despite UF 3-4kg, plan daily HD, talked w pt about fluid restriction 5. Anemia - Hgb  8.0 > 7.6  S/p 2 units PRBCs overnight. Continue Aranesp 100 q Wed. Weekly Fe on Tuesdays. Transfuse PRN  6. Metabolic bone disease - Ca and phos controlled. Phoslyra here due to swallowing difficulty. No Vit D. Renal panel pending. 7. Goiter - Dr. Gerrit Friends managing, not a surgical candidate 8. DM Type 1 - per primary 9. Dispo - Lives at home. Will need a caregiver and INR follow up with PCP. Rx for Phoslyra 5ml ac if still having trouble swallowing 10. Hypokalemia- doubt this is accurate lab today (K<2.0), will repeat in am  [name] MD pager [phone]    cell [phone] 03/05/2013, 12:12 PM     Additional Objective Labs: Basic Metabolic Panel:  Recent Labs Lab 02/26/13 1326  02/28/13 0840  03/03/13 1500 03/04/13 0822 03/05/13 0915  NA 128*  < > 127*  < > 129* 128* 135  K 4.7  < > 4.5  < > 5.0 4.1  <2.0*  CL 88*  < > 88*  < > 92* 92* 94*  CO2 24  < > 24  < > 24 25 31   GLUCOSE 176*  < > 171*  < > 169* 255* 129*  BUN 41*  < > 29*  < > 42* 23 <3*  CREATININE 7.10*  < > 5.38*  < > 6.85* 4.77* 0.76  CALCIUM 8.6  < > 8.8  < > 8.6 8.8 8.2*  PHOS 4.1  --  3.7  --  3.9  --   --   < > = values in this interval not displayed. Liver Function Tests:  Recent Labs Lab 02/26/13 1326 02/28/13 0840 03/03/13 1500  ALBUMIN 2.9* 3.1* 2.6*   No results found for this basename: LIPASE, AMYLASE,  in the last 168 hours CBC:  Recent Labs Lab 03/02/13 0835  03/03/13 0425 03/03/13 1525 03/04/13 0822 03/05/13 0915  WBC 7.4  --  8.1 4.2 5.0 5.3  NEUTROABS  --   --   --   --  3.0  --   HGB 10.9*  < > 8.0* 7.6* 8.2* 7.9*  HCT 33.8*  < > 23.2* 23.4* 24.9* 23.8*  MCV 94.9  --  92.8 92.5 92.9 91.5  PLT 250  --  178 160 168 166  < > = values in this interval not displayed. Blood Culture    Component Value Date/Time   SDES WOUND LEFT THIGH GRAFT 03/03/2013 0246   SDES WOUND LEFT THIGH GRAFT 03/03/2013 0246   SPECREQUEST NONE PT ON VANCOMYCIN 03/03/2013 0246   SPECREQUEST PT ON VANCOMYCIN 03/03/2013 0246   CULT  Value: NO GROWTH 2 DAYS Performed at [facility] 03/03/2013 0246   CULT  Value: NO ANAEROBES ISOLATED; CULTURE IN PROGRESS FOR 5 DAYS Performed at [facility] 03/03/2013 0246   REPTSTATUS 03/05/2013 FINAL 03/03/2013 0246   REPTSTATUS PENDING 03/03/2013 0246    Cardiac Enzymes: No results found for this basename: CKTOTAL, CKMB, CKMBINDEX, TROPONINI,  in the last 168 hours CBG:  Recent Labs Lab 03/04/13 1146 03/04/13 1807 03/04/13 2001 03/05/13 0005 03/05/13 0723  GLUCAP 235* 170* 167* 174* 109*   Iron Studies: No results found for this basename: IRON, TIBC, TRANSFERRIN, FERRITIN,  in the last 72 hours @lablastinr3 @ Studies/Results: No results found. Medications:   . amLODipine  10 mg Oral QHS  . antiseptic oral rinse  15 mL Mouth Rinse QID  . aspirin EC   81 mg Oral Daily  . calcium acetate (Phos Binder)  667 mg Oral TID WC  . cloNIDine  0.2 mg Oral TID  . darbepoetin      . darbepoetin (ARANESP) injection - DIALYSIS  100 mcg Intravenous Q Wed-HD  . docusate sodium  100 mg Oral BID  . feeding supplement (RESOURCE BREEZE)  1 Container Oral BID BM  . ferric gluconate (FERRLECIT/NULECIT) IV  62.5 mg Intravenous Q Wed-HD  . FLUoxetine  20 mg Oral Daily  . influenza vac split quadrivalent PF  0.5 mL Intramuscular Tomorrow-1000  . insulin aspart  1-5 Units Subcutaneous TID WC  . levothyroxine  112 mcg Oral QAC breakfast  . metoprolol  75 mg Oral BID  . multivitamin  1 tablet Oral QHS  . pantoprazole  40 mg Oral Daily  . simvastatin  5 mg Oral q1800  . sodium chloride  3 mL Intravenous Q12H  . temazepam  15 mg Oral Custom  . THROMBI-PAD  1 each Topical Once  . warfarin  1 mg Oral ONCE-1800  . Warfarin - Pharmacist Dosing Inpatient   Does not apply [phone]

## 2013-03-05 NOTE — Progress Notes (Signed)
[facility] PROGRESS NOTE  Marcus [name] ZOX:[id number] DOB: 1955-11-05 DOA: 02/25/2013 PCP: Galvin Proffer, MD  Brief narrative 56 y.o. male with h/o ESRD (HD MWF) who presents to [facility] with worsening R arm swelling over the past week. His current HD access is in his L thigh and unrelated to this. Patient had a recent  attempt at thyroidectomy performed last month by Dr. Gerrit Friends. 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   In the ED at [facility] he was found to have not only RUE DVT but thrombus in his R IJ as well. The enlargement of the thyroid appears to be extending down into his thoracic cavity (unchanged since prior CT). 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  [facility].  Patient was seen by vascular surgery and underwent ligation of right forearm AV graft. Post procedure he was tolerating this well and started on Coumadin when on the evening of 11/16, patient started having significant bleeding from the left thigh AV graft. Patient was taken emergently to the OR where he underwent ligation of this graft. In addition and underwent right femoral vein cannulation and tunneled dialysis catheter was placed. He was monitored in the surgical ICU on 11/17-18 and then transferred back to nephrology floor.    Assessment/Plan:  Rt IJ thrombus  -was on heparin gtt, which was then on hold due to bleeding fistula,  -INR therapeutic yesterday, will start Coumadin without bridge today.  -Possible from direct compression of IJ by enlarged thyroid.   -per VVS ok to resume anticoagulation Appreciate consultation by general surgery, vascular surgery and interventional radiology.  -s/p Ligation of right AV graft done last week  Left thigh graft bleed:   -Greatly appreciate vascular surgery and critical care help.  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 Per DR.Gerkin -given recent complications, not felt to be a surgical candidate per [facility] at this time -will discuss with endocrinology regarding options -CT reports unchanged airway compression. patient in no resp distress. does c/o some new dysphasia, seen by ST has seen and put on dysphagia 3 diet  ESRD on HD ( M,W,F)  renal following .blood from left thigh graft. See above. Access catheter placed on right leg  Type 1 DM  -continue lantus, SSI  HTN  continue home meds   Hx of CVA  continue ASA, statin   GERD  continue PPI   DIET: dys level 3   DVT prophylaxis: on anticoagulation.  Code Status: full code  Family Communication:none at bedside Disposition Plan: pending    Consultants:  CCS  vascular surgery ( Dr Darrick Penna)  IR Critical care   Procedures:  For right IJ thrombus ligation done 11/13 Ligation of left AV graft secondary to bleed done 11/17. Placement of tunneled catheter on right leg 11/17  Antibiotics:  None  HPI/Subjective:  Patient tired, some intermittent complaints regarding breathing appears comfortable   Objective: Filed Vitals:   03/05/13 1230  BP: 143/78  Pulse: 59  Temp:   Resp:     Intake/Output Summary (Last 24 hours) at 03/05/13 1234 Last data filed at 03/05/13 0659  Gross per 24 hour  Intake    180 ml  Output      0 ml  Net    180 ml   Filed Weights   03/04/13 0600 03/04/13 2002 03/05/13 0920  Weight: 67 kg (147 lb 11.3 oz) 68.221 kg (150 lb 6.4 oz) 69.2 kg (152 lb 8.9 oz)    Exam: General: alert and oriented x2, no acute distress HEENT: no pallor, moist mucosa, enlarged thyroid  Chest: Labored breathing, but lungs clear auscultation bilaterally  CVS: Regular rate and rhythm, S1-S2 Abd: Soft, nontender,  nondistended, positive bowel sounds  Ext: swollen rt arm and forearm , tunneled catheter-right leg   Data Reviewed: Basic Metabolic Panel:  Recent Labs Lab 02/26/13 1326  02/28/13 0840 03/02/13 0835 03/03/13 0425 03/03/13 1500 03/04/13 0822 03/05/13 0915  NA 128*  < > 127* 128* 129* 129* 128* 135  K 4.7  < > 4.5 5.3* 4.5 5.0 4.1 <2.0*  CL 88*  < > 88* 93* 92* 92* 92* 94*  CO2 24  < > 24 22 24 24 25 31   GLUCOSE 176*  < > 171* 60* 125* 169* 255* 129*  BUN 41*  < > 29* 29* 39* 42* 23 <3*  CREATININE 7.10*  < > 5.38* 4.90* 6.02* 6.85* 4.77* 0.76  CALCIUM 8.6  < > 8.8 9.2 8.5 8.6 8.8 8.2*  PHOS 4.1  --  3.7  --   --  3.9  --   --   < > = values in this interval not displayed. Liver Function Tests:  Recent Labs Lab 02/26/13 1326 02/28/13 0840 03/03/13 1500  ALBUMIN 2.9* 3.1* 2.6*   No results found for this basename: LIPASE, AMYLASE,  in the last 168 hours No results found for this basename: AMMONIA,  in the last 168 hours CBC:  Recent Labs Lab 03/02/13 0835 03/03/13 0045 03/03/13 0425 03/03/13 1525 03/04/13 0822 03/05/13 0915  WBC 7.4  --  8.1 4.2 5.0 5.3  NEUTROABS  --   --   --   --  3.0  --   HGB 10.9* 7.6* 8.0* 7.6* 8.2* 7.9*  HCT 33.8* 24.1* 23.2* 23.4* 24.9* 23.8*  MCV 94.9  --  92.8 92.5 92.9 91.5  PLT 250  --  178 160 168 166   Cardiac Enzymes: No results found for this basename: CKTOTAL, CKMB, CKMBINDEX, TROPONINI,  in the last 168 hours BNP (last 3 results) No results found for this basename: PROBNP,  in the last 8760 hours CBG:  Recent Labs Lab 03/04/13 1146 03/04/13 1807 03/04/13 2001 03/05/13 0005 03/05/13 0723  GLUCAP 235* 170* 167* 174* 109*    Recent Results (from the past 240 hour(s))  MRSA PCR SCREENING     Status: None   Collection Time    02/25/13  1:36 AM      Result Value Range Status   MRSA by PCR NEGATIVE  NEGATIVE Final   Comment:            The GeneXpert MRSA Assay (FDA     approved for NASAL specimens     only), is  one component of a     comprehensive MRSA colonization     surveillance program. It is not     intended to diagnose MRSA     infection nor to guide or     monitor treatment for     MRSA infections.  WOUND CULTURE     Status: None   Collection Time    03/03/13  2:46 AM  Result Value Range Status   Specimen Description WOUND LEFT THIGH GRAFT   Final   Special Requests NONE PT ON VANCOMYCIN   Final   Gram Stain     Final   Value: NO WBC SEEN     NO SQUAMOUS EPITHELIAL CELLS SEEN     NO ORGANISMS SEEN     Performed at [facility]   Culture     Final   Value: NO GROWTH 2 DAYS     Performed at [facility]   Report Status 03/05/2013 FINAL   Final  ANAEROBIC CULTURE     Status: None   Collection Time    03/03/13  2:46 AM      Result Value Range Status   Specimen Description WOUND LEFT THIGH GRAFT   Final   Special Requests PT ON VANCOMYCIN   Final   Gram Stain     Final   Value: NO WBC SEEN     NO SQUAMOUS EPITHELIAL CELLS SEEN     NO ORGANISMS SEEN     Performed at [facility]   Culture     Final   Value: NO ANAEROBES ISOLATED; CULTURE IN PROGRESS FOR 5 DAYS     Performed at [facility]   Report Status PENDING   Incomplete     Studies: No results found.  Scheduled Meds: . amLODipine  10 mg Oral QHS  . antiseptic oral rinse  15 mL Mouth Rinse QID  . aspirin EC  81 mg Oral Daily  . calcium acetate (Phos Binder)  667 mg Oral TID WC  . cloNIDine  0.2 mg Oral TID  . darbepoetin      . darbepoetin (ARANESP) injection - DIALYSIS  100 mcg Intravenous Q Wed-HD  . docusate sodium  100 mg Oral BID  . feeding supplement (RESOURCE BREEZE)  1 Container Oral BID BM  . ferric gluconate (FERRLECIT/NULECIT) IV  62.5 mg Intravenous Q Wed-HD  . FLUoxetine  20 mg Oral Daily  . influenza vac split quadrivalent PF  0.5 mL Intramuscular Tomorrow-1000  . insulin aspart  1-5 Units Subcutaneous TID WC  . levothyroxine  112 mcg Oral QAC breakfast  .  metoprolol  75 mg Oral BID  . multivitamin  1 tablet Oral QHS  . pantoprazole  40 mg Oral Daily  . simvastatin  5 mg Oral q1800  . sodium chloride  3 mL Intravenous Q12H  . temazepam  15 mg Oral Custom  . THROMBI-PAD  1 each Topical Once  . warfarin  1 mg Oral ONCE-1800  . Warfarin - Pharmacist Dosing Inpatient   Does not apply q1800   Continuous Infusions:      Time spent: 20 minutes    Alysse Rathe  [facility] Pager [phone] If 7PM-7AM, please contact night-coverage at [email or web address], password [facility] 03/05/2013, 12:34 PM  LOS: 8 days

## 2013-03-05 NOTE — Procedures (Signed)
I was present at this dialysis session, have reviewed the session itself and made  appropriate changes   [name] MD  pager [phone]    cell [phone]  03/05/2013, 11:27 AM

## 2013-03-06 DIAGNOSIS — I82890 Acute embolism and thrombosis of other specified veins: Secondary | ICD-10-CM

## 2013-03-06 DIAGNOSIS — N186 End stage renal disease: Secondary | ICD-10-CM

## 2013-03-06 DIAGNOSIS — E079 Disorder of thyroid, unspecified: Secondary | ICD-10-CM

## 2013-03-06 DIAGNOSIS — E1065 Type 1 diabetes mellitus with hyperglycemia: Secondary | ICD-10-CM

## 2013-03-06 DIAGNOSIS — I82A19 Acute embolism and thrombosis of unspecified axillary vein: Secondary | ICD-10-CM

## 2013-03-06 LAB — HEPARIN LEVEL (UNFRACTIONATED): Heparin Unfractionated: 0.85 IU/mL — ABNORMAL HIGH (ref 0.30–0.70)

## 2013-03-06 LAB — GLUCOSE, CAPILLARY
Glucose-Capillary: 352 mg/dL — ABNORMAL HIGH (ref 70–99)
Glucose-Capillary: 487 mg/dL — ABNORMAL HIGH (ref 70–99)

## 2013-03-06 LAB — BASIC METABOLIC PANEL
GFR calc Af Amer: 16 mL/min — ABNORMAL LOW (ref >90)
GFR calc non Af Amer: 14 mL/min — ABNORMAL LOW (ref >90)
Glucose, Bld: 374 mg/dL — ABNORMAL HIGH (ref 70–99)
Potassium: 4.8 mEq/L (ref 3.5–5.1)
Sodium: 128 mEq/L — ABNORMAL LOW (ref 135–145)

## 2013-03-06 LAB — TRIGLYCERIDES: Triglycerides: 71 mg/dL (ref <150)

## 2013-03-06 LAB — TSH: TSH: 108.773 u[IU]/mL — ABNORMAL HIGH (ref 0.350–4.500)

## 2013-03-06 LAB — PROTIME-INR: INR: 1.59 — ABNORMAL HIGH (ref 0.00–1.49)

## 2013-03-06 LAB — CBC
HCT: 24.2 % — ABNORMAL LOW (ref 39.0–52.0)
Hemoglobin: 8 g/dL — ABNORMAL LOW (ref 13.0–17.0)
MCH: 30.4 pg (ref 26.0–34.0)
MCHC: 33.1 g/dL (ref 30.0–36.0)
Platelets: 161 10*3/uL (ref 150–400)
RBC: 2.63 MIL/uL — ABNORMAL LOW (ref 4.22–5.81)

## 2013-03-06 LAB — T4, FREE: Free T4: 1.93 ng/dL — ABNORMAL HIGH (ref 0.80–1.80)

## 2013-03-06 MED ORDER — WARFARIN SODIUM 5 MG PO TABS
5.0000 mg | ORAL_TABLET | Freq: Once | ORAL | Status: AC
  Administered 2013-03-06: 5 mg via ORAL
  Filled 2013-03-06: qty 1

## 2013-03-06 MED ORDER — INSULIN ASPART 100 UNIT/ML ~~LOC~~ SOLN
15.0000 [IU] | Freq: Once | SUBCUTANEOUS | Status: AC
  Administered 2013-03-06: 15 [IU] via SUBCUTANEOUS

## 2013-03-06 MED ORDER — INSULIN ASPART 100 UNIT/ML ~~LOC~~ SOLN
0.0000 [IU] | Freq: Three times a day (TID) | SUBCUTANEOUS | Status: DC
  Administered 2013-03-07: 3 [IU] via SUBCUTANEOUS
  Administered 2013-03-07: 8 [IU] via SUBCUTANEOUS
  Administered 2013-03-08: 11 [IU] via SUBCUTANEOUS
  Administered 2013-03-09: 15 [IU] via SUBCUTANEOUS
  Administered 2013-03-10 (×2): 2 [IU] via SUBCUTANEOUS

## 2013-03-06 MED ORDER — HEPARIN (PORCINE) IN NACL 100-0.45 UNIT/ML-% IJ SOLN
1700.0000 [IU]/h | INTRAMUSCULAR | Status: DC
  Administered 2013-03-07: 1750 [IU]/h via INTRAVENOUS
  Administered 2013-03-07: 1500 [IU]/h via INTRAVENOUS
  Administered 2013-03-08: 1750 [IU]/h via INTRAVENOUS
  Filled 2013-03-06 (×7): qty 250

## 2013-03-06 MED ORDER — INSULIN GLARGINE 100 UNIT/ML ~~LOC~~ SOLN
12.0000 [IU] | Freq: Every day | SUBCUTANEOUS | Status: DC
  Administered 2013-03-06: 12 [IU] via SUBCUTANEOUS
  Filled 2013-03-06 (×2): qty 0.12

## 2013-03-06 MED ORDER — OXYCODONE-ACETAMINOPHEN 5-325 MG PO TABS
ORAL_TABLET | ORAL | Status: AC
  Administered 2013-03-06: 2 via ORAL
  Filled 2013-03-06: qty 2

## 2013-03-06 MED ORDER — HEPARIN (PORCINE) IN NACL 100-0.45 UNIT/ML-% IJ SOLN
1700.0000 [IU]/h | INTRAMUSCULAR | Status: DC
  Administered 2013-03-06: 1700 [IU]/h via INTRAVENOUS
  Filled 2013-03-06 (×2): qty 250

## 2013-03-06 NOTE — Progress Notes (Signed)
ANTICOAGULATION CONSULT NOTE: HEPARIN  Indication: RUE DVT and RIJ thrombus  Heparin level = .0.85 on 1700 units/hr Goal heparin level: 0.3-0.5  The heparin level is quite a bit above the conservative goal range we were aiming for due to a recent bleed that resulted in heparin being held for a couple of days. The RN verified that the blood was drawn from a foot and not the arm the heparin was infusing into. The level could have been affected by heparin used during HD.   Plan: We will hold the heparin for an hour and restart at 1500 units/hr. The level drawn with AM labs will probably give [place] a better idea of the rate adjustment needed since it won't be affected by HD.  Cardell Peach, PharmD

## 2013-03-06 NOTE — Progress Notes (Addendum)
[facility] PROGRESS NOTE  Marcus [name] ZOX:[id number] DOB: Apr 05, 1956 DOA: 02/25/2013 PCP: Galvin Proffer, MD  Brief narrative 57 y.o. male with h/o ESRD (HD MWF) who presents to [place] [facility] with worsening R arm swelling over the past week. His current HD access is in his L thigh and unrelated to this. Patient had a recent  attempt at thyroidectomy performed last month by Dr. Gerrit Friends. 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   In the ED at [facility] he was found to have not only RUE DVT but thrombus in his R IJ as well. The enlargement of the thyroid appears to be extending down into his thoracic cavity (unchanged since prior CT). 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  [facility].  Patient was seen by vascular surgery and underwent ligation of right forearm AV graft. Post procedure he was tolerating this well and started on Coumadin when on the evening of 11/16, patient started having significant bleeding from the left thigh AV graft. Patient was taken emergently to the OR where he underwent ligation of this graft. In addition and underwent right femoral vein cannulation and tunneled dialysis catheter was placed. He was monitored in the surgical ICU on 11/17-18 and then transferred back to nephrology floor.    Assessment/Plan:  Rt IJ thrombus/RUE DVT -was on heparin gtt, which was then on hold due to bleeding fistula,  -INR therapeutic 11/18 then trended down and hence will bridge Coumadin, with heparin, without bolus today.  -Possibly from direct compression of IJ by enlarged thyroid.   -per VVS ok to resume anticoagulation Appreciate consultation by general surgery, vascular surgery and interventional radiology.  -s/p Ligation of right AV graft  done last week  Left thigh graft bleed:  -Greatly appreciate vascular surgery and critical care help.  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 Per Dr.Gerkin -given recent complications, not felt to be a surgical candidate per [facility] at this time -will discuss with endocrinology regarding options namely radioactive iodine/XRT, TSH/T4 pending -CT reports unchanged airway compression. patient in no resp distress. does c/o some new dysphasia, seen by ST has seen and put on dysphagia 3 diet  ESRD on HD ( M,W,F)  renal following .blood from left thigh graft. See above. HD cath R leg now  Type 1 DM  -CBG trending up, restart lantus, SSI  HTN  continue home meds   Hx of CVA  continue ASA, statin   GERD  continue PPI   DIET: dys level 3   DVT prophylaxis: on anticoagulation.  Ambulate/PT/OT  Code Status: full code  Family Communication:none at bedside Disposition Plan: home vs ST rehab   Consultants:  CCS  vascular surgery ( Dr Darrick Penna)  IR Critical care   Procedures:  For right IJ thrombus ligation done 11/13 Ligation of left AV graft secondary to bleed done 11/17. Placement of tunneled catheter on right leg 11/17  Antibiotics:  None  HPI/Subjective:  Denies any complaints today   Objective: Filed Vitals:   03/06/13 0952  BP: 172/76  Pulse: 56  Temp: 98.6 F (37 C)  Resp: 18    Intake/Output Summary (Last 24  hours) at 03/06/13 1044 Last data filed at 03/06/13 0900  Gross per 24 hour  Intake    480 ml  Output   4867 ml  Net  -4387 ml   Filed Weights   03/05/13 0920 03/05/13 1355 03/05/13 2216  Weight: 69.2 kg (152 lb 8.9 oz) 63.3 kg (139 lb 8.8 oz) 65.227 kg (143 lb 12.8 oz)    Exam: General: alert and oriented x2, no acute distress HEENT: no pallor, moist mucosa, enlarged thyroid  Chest:lungs clear auscultation bilaterally  CVS:  Regular rate and rhythm, S1-S2 Abd: Soft, nontender, nondistended, positive bowel sounds  Ext: swollen rt arm and forearm , tunneled catheter-right leg   Data Reviewed: Basic Metabolic Panel:  Recent Labs Lab 02/28/13 0840  03/03/13 0425 03/03/13 1500 03/04/13 0822 03/05/13 0915 03/06/13 0644  NA 127*  < > 129* 129* 128* 135 128*  K 4.5  < > 4.5 5.0 4.1 <2.0* 4.8  CL 88*  < > 92* 92* 92* 94* 91*  CO2 24  < > 24 24 25 31 22   GLUCOSE 171*  < > 125* 169* 255* 129* 374*  BUN 29*  < > 39* 42* 23 <3* 24*  CREATININE 5.38*  < > 6.02* 6.85* 4.77* 0.76 4.36*  CALCIUM 8.8  < > 8.5 8.6 8.8 8.2* 8.6  PHOS 3.7  --   --  3.9  --   --   --   < > = values in this interval not displayed. Liver Function Tests:  Recent Labs Lab 02/28/13 0840 03/03/13 1500  ALBUMIN 3.1* 2.6*   No results found for this basename: LIPASE, AMYLASE,  in the last 168 hours No results found for this basename: AMMONIA,  in the last 168 hours CBC:  Recent Labs Lab 03/03/13 0425 03/03/13 1525 03/04/13 0822 03/05/13 0915 03/06/13 0710  WBC 8.1 4.2 5.0 5.3 4.7  NEUTROABS  --   --  3.0  --   --   HGB 8.0* 7.6* 8.2* 7.9* 8.0*  HCT 23.2* 23.4* 24.9* 23.8* 24.2*  MCV 92.8 92.5 92.9 91.5 92.0  PLT 178 160 168 166 161   Cardiac Enzymes: No results found for this basename: CKTOTAL, CKMB, CKMBINDEX, TROPONINI,  in the last 168 hours BNP (last 3 results) No results found for this basename: PROBNP,  in the last 8760 hours CBG:  Recent Labs Lab 03/05/13 0005 03/05/13 0723 03/05/13 1435 03/05/13 1643 03/06/13 0733  GLUCAP 174* 109* 206* 312* 352*    Recent Results (from the past 240 hour(s))  MRSA PCR SCREENING     Status: None   Collection Time    02/25/13  1:36 AM      Result Value Range Status   MRSA by PCR NEGATIVE  NEGATIVE Final   Comment:            The GeneXpert MRSA Assay (FDA     approved for NASAL specimens     only), is one component of a     comprehensive MRSA colonization      surveillance program. It is not     intended to diagnose MRSA     infection nor to guide or     monitor treatment for     MRSA infections.  WOUND CULTURE     Status: None   Collection Time    03/03/13  2:46 AM      Result Value Range Status   Specimen Description WOUND LEFT THIGH GRAFT   Final   Special  Requests NONE PT ON VANCOMYCIN   Final   Gram Stain     Final   Value: NO WBC SEEN     NO SQUAMOUS EPITHELIAL CELLS SEEN     NO ORGANISMS SEEN     Performed at [facility]   Culture     Final   Value: NO GROWTH 2 DAYS     Performed at [facility]   Report Status 03/05/2013 FINAL   Final  ANAEROBIC CULTURE     Status: None   Collection Time    03/03/13  2:46 AM      Result Value Range Status   Specimen Description WOUND LEFT THIGH GRAFT   Final   Special Requests PT ON VANCOMYCIN   Final   Gram Stain     Final   Value: NO WBC SEEN     NO SQUAMOUS EPITHELIAL CELLS SEEN     NO ORGANISMS SEEN     Performed at [facility]   Culture     Final   Value: NO ANAEROBES ISOLATED; CULTURE IN PROGRESS FOR 5 DAYS     Performed at [facility]   Report Status PENDING   Incomplete     Studies: No results found.  Scheduled Meds: . amLODipine  10 mg Oral QHS  . antiseptic oral rinse  15 mL Mouth Rinse QID  . aspirin EC  81 mg Oral Daily  . calcium acetate (Phos Binder)  667 mg Oral TID WC  . cloNIDine  0.2 mg Oral TID  . darbepoetin (ARANESP) injection - DIALYSIS  100 mcg Intravenous Q Wed-HD  . docusate sodium  100 mg Oral BID  . feeding supplement (RESOURCE BREEZE)  1 Container Oral BID BM  . ferric gluconate (FERRLECIT/NULECIT) IV  62.5 mg Intravenous Q Wed-HD  . FLUoxetine  20 mg Oral Daily  . heparin  2,400 Units Dialysis Once in dialysis  . influenza vac split quadrivalent PF  0.5 mL Intramuscular Tomorrow-1000  . insulin aspart  1-5 Units Subcutaneous TID WC  . levothyroxine  112 mcg Oral QAC breakfast  . metoprolol  75 mg Oral BID  .  multivitamin  1 tablet Oral QHS  . pantoprazole  40 mg Oral Daily  . simvastatin  5 mg Oral q1800  . sodium chloride  3 mL Intravenous Q12H  . temazepam  15 mg Oral Custom  . warfarin  5 mg Oral ONCE-1800  . Warfarin - Pharmacist Dosing Inpatient   Does not apply q1800   Continuous Infusions: . heparin        Time spent: 20 minutes    Williette Loewe  [facility] Pager [phone] If 7PM-7AM, please contact night-coverage at [email or web address], password [facility] 03/06/2013, 10:44 AM  LOS: 9 days

## 2013-03-06 NOTE — Progress Notes (Signed)
ANTICOAGULATION CONSULT NOTE - Follow Up Consult  Pharmacy Consult for Heparin + Warfarin Indication: RUE DVT and RIJ thrombus  Allergies  Allergen Reactions  . Penicillins Other (See Comments)    Unknown reaction    Patient Measurements: Height: 6\' 2"  (188 cm) Weight: 143 lb 12.8 oz (65.227 kg) IBW/kg (Calculated) : 82.2 Heparin Dosing Weight: 65.2 kg  Vital Signs: Temp: 97.8 F (36.6 C) (11/20 0415) Temp src: Oral (11/20 0415) BP: 153/80 mmHg (11/20 0415) Pulse Rate: 63 (11/20 0415)  Labs:  Recent Labs  03/03/13 1500  03/04/13 0822 03/05/13 0915 03/06/13 0710  HGB  --   < > 8.2* 7.9* 8.0*  HCT  --   < > 24.9* 23.8* 24.2*  PLT  --   < > 168 166 161  LABPROT  --   --  25.7* 20.8* 18.5*  INR  --   --  2.44* 1.85* 1.59*  CREATININE 6.85*  --  4.77* 0.76  --   < > = values in this interval not displayed.  Estimated Creatinine Clearance: 94 ml/min (by C-G formula based on Cr of 0.76).  Assessment: 57 y.o. M to resume a heparin bridge to therapeutic INR with warfarin for new RUE DVT and RIJ thrombus found at [facility] on 02/24/13. The patient was previously bridged with heparin thru 11/16 however developed L-thigh graft bleeding requiring urgent ligation -- both heparin and warfarin held 11/17-11/18. Warfarin resumed yesterday evening at a lower dose per MD request, INR remains SUBtherapeutic today (INR 1.59 << 1.85, goal of 2-3). Plans are to resume a heparin bridge today given the patient's recent DVT/thrombus. No further bleeding noted. Hgb/Hct/Plt remains stable.   The patient was known to be therapeutic on a rate of 1800 units/hr a couple of days ago. Will start at a slightly lower rate this morning and titrate up as appropriate.   Goal of Therapy:  INR 2-3 Heparin level 0.3-0.5 units/ml (d/t recent bleed) Monitor platelets by anticoagulation protocol: Yes   Plan:  1. Resume heparin at 1700 units/hr (17 ml/hr) 2. Warfarin 5 mg x 1 dose at 1800 today 3.  Will continue to monitor for any signs/symptoms of bleeding and will follow up with heparin level in 8 hours and PT/INR in the a.m.  Georgina Pillion, PharmD, BCPS Clinical Pharmacist Pager: [phone] 03/06/2013 9:52 AM

## 2013-03-06 NOTE — Progress Notes (Signed)
[facility] Progress Note  Subjective:   Unable to sleep. "I have a lot on my mind". Pain controlled.  Objective Filed Vitals:   03/05/13 1800 03/05/13 2216 03/06/13 0415 03/06/13 0952  BP: 165/80 144/81 153/80 172/76  Pulse: 67 56 63 56  Temp: 98.6 F (37 C) 99.1 F (37.3 C) 97.8 F (36.6 C) 98.6 F (37 C)  TempSrc: Oral Oral Oral Oral  Resp: 18 18 18 18   Height:  6\' 2"  (1.88 m)    Weight:  65.227 kg (143 lb 12.8 oz)    SpO2: 96% 98% 98% 98%   Physical Exam General: Drowsy, chronically-ill appearing, NAD Heart: RRR, no murmur or rub noted Lungs: CTA bilat, no wheezes or rhonchi Abdomen: Soft, NT, non distended, + BS Extremities: RUE edema with slight improvement. No pretibial edema Dialysis Access: Rt fem TDC (RFA AVF ligated, L thigh AVG ligated)  Dialysis Orders: [facility] MWF  4hr 15 min 61kg 2K/2.25Ca Heparin 2400 R thigh cath  Hectorol 0 (pth 56) Epogen 6800 Venofer 50mg  per week   Assessment/Plan: 1. RUE edema/ RUE DVT (IJ, axillary, brachiocephalic veins): R arm AVG ligated 11/13 due to venous HTN,  Hep/Coumadin per primary 2. L thigh AVG bleed - ligated 11/17, intraop cx's negative 3. ESRD: MWF HD again today for vol xs.  K+ 4.8 (prev lab of <2.0 erroneous) 4. HTN/volume excess - SBPs 170s on clonidine, norvasc and metroprolol. Remains 4-5 kg over, wt not declining despite UF with HD, pt counseled about fluid restriction. Daily HD until resolved 5. Anemia - Hgb 8.0 > 7.6 s/p 2 units PRBCs. Continue Aranesp 100 q Wed. Weekly Fe on Tuesdays. Transfuse PRN  6. Metabolic bone disease - Ca and phos controlled. Phoslyra here due to swallowing difficulty. No Vit D.  7. Nutrition - Dys 3 renal diet with fluid restr, multivitamin 8. Goiter - Dr. Gerrit Friends managing, not a surgical candidate 9. DM Type 1 - per primary 10. Dispo - Lives at home. Will need a caregiver and INR follow up with PCP. Rx for Phoslyra 5ml ac if still having trouble swallowing   Clydie Braun E.  Broadus John, PA-C [facility] Pager [phone] 03/06/2013,10:48 AM  LOS: 9 days    I was present at this dialysis session, have reviewed the session itself and made  appropriate changes   [name] MD  pager [phone]    cell [phone]  03/06/2013, 11:28 AM   Additional Objective Labs: Basic Metabolic Panel:  Recent Labs Lab 02/28/13 0840  03/03/13 1500 03/04/13 0822 03/05/13 0915 03/06/13 0644  NA 127*  < > 129* 128* 135 128*  K 4.5  < > 5.0 4.1 <2.0* 4.8  CL 88*  < > 92* 92* 94* 91*  CO2 24  < > 24 25 31 22   GLUCOSE 171*  < > 169* 255* 129* 374*  BUN 29*  < > 42* 23 <3* 24*  CREATININE 5.38*  < > 6.85* 4.77* 0.76 4.36*  CALCIUM 8.8  < > 8.6 8.8 8.2* 8.6  PHOS 3.7  --  3.9  --   --   --   < > = values in this interval not displayed. Liver Function Tests:  Recent Labs Lab 02/28/13 0840 03/03/13 1500  ALBUMIN 3.1* 2.6*   CBC:  Recent Labs Lab 03/03/13 0425 03/03/13 1525 03/04/13 0822 03/05/13 0915 03/06/13 0710  WBC 8.1 4.2 5.0 5.3 4.7  NEUTROABS  --   --  3.0  --   --  HGB 8.0* 7.6* 8.2* 7.9* 8.0*  HCT 23.2* 23.4* 24.9* 23.8* 24.2*  MCV 92.8 92.5 92.9 91.5 92.0  PLT 178 160 168 166 161   Blood Culture    Component Value Date/Time   SDES WOUND LEFT THIGH GRAFT 03/03/2013 0246   SDES WOUND LEFT THIGH GRAFT 03/03/2013 0246   SPECREQUEST NONE PT ON VANCOMYCIN 03/03/2013 0246   SPECREQUEST PT ON VANCOMYCIN 03/03/2013 0246   CULT  Value: NO GROWTH 2 DAYS Performed at [facility] 03/03/2013 0246   CULT  Value: NO ANAEROBES ISOLATED; CULTURE IN PROGRESS FOR 5 DAYS Performed at [facility] 03/03/2013 0246   REPTSTATUS 03/05/2013 FINAL 03/03/2013 0246   REPTSTATUS PENDING 03/03/2013 0246    CBG:  Recent Labs Lab 03/05/13 0005 03/05/13 0723 03/05/13 1435 03/05/13 1643 03/06/13 0733  GLUCAP 174* 109* 206* 312* 352*   IStudies/Results: No results found. Medications: . heparin     . amLODipine  10 mg Oral QHS  .  antiseptic oral rinse  15 mL Mouth Rinse QID  . aspirin EC  81 mg Oral Daily  . calcium acetate (Phos Binder)  667 mg Oral TID WC  . cloNIDine  0.2 mg Oral TID  . darbepoetin (ARANESP) injection - DIALYSIS  100 mcg Intravenous Q Wed-HD  . docusate sodium  100 mg Oral BID  . feeding supplement (RESOURCE BREEZE)  1 Container Oral BID BM  . ferric gluconate (FERRLECIT/NULECIT) IV  62.5 mg Intravenous Q Wed-HD  . FLUoxetine  20 mg Oral Daily  . heparin  2,400 Units Dialysis Once in dialysis  . influenza vac split quadrivalent PF  0.5 mL Intramuscular Tomorrow-1000  . insulin aspart  1-5 Units Subcutaneous TID WC  . levothyroxine  112 mcg Oral QAC breakfast  . metoprolol  75 mg Oral BID  . multivitamin  1 tablet Oral QHS  . pantoprazole  40 mg Oral Daily  . simvastatin  5 mg Oral q1800  . sodium chloride  3 mL Intravenous Q12H  . temazepam  15 mg Oral Custom  . warfarin  5 mg Oral ONCE-1800  . Warfarin - Pharmacist Dosing Inpatient   Does not apply [phone]

## 2013-03-06 NOTE — Procedures (Signed)
Pt seen on HD.  Ap 250 Vp 110.  SBP 132.  Tolerating HD well so far.

## 2013-03-07 ENCOUNTER — Encounter (HOSPITAL_COMMUNITY): Payer: Self-pay | Admitting: Vascular Surgery

## 2013-03-07 LAB — PROTIME-INR
INR: 1.39 (ref 0.00–1.49)
Prothrombin Time: 16.7 seconds — ABNORMAL HIGH (ref 11.6–15.2)

## 2013-03-07 LAB — TYPE AND SCREEN
ABO/RH(D): A POS
Unit division: 0

## 2013-03-07 LAB — BASIC METABOLIC PANEL
BUN: 17 mg/dL (ref 6–23)
CO2: 24 mEq/L (ref 19–32)
Calcium: 9 mg/dL (ref 8.4–10.5)
Creatinine, Ser: 3.94 mg/dL — ABNORMAL HIGH (ref 0.50–1.35)
GFR calc non Af Amer: 16 mL/min — ABNORMAL LOW (ref >90)
Glucose, Bld: 272 mg/dL — ABNORMAL HIGH (ref 70–99)
Potassium: 4.6 mEq/L (ref 3.5–5.1)

## 2013-03-07 LAB — GLUCOSE, CAPILLARY
Glucose-Capillary: 279 mg/dL — ABNORMAL HIGH (ref 70–99)
Glucose-Capillary: 118 mg/dL — ABNORMAL HIGH (ref 70–99)
Glucose-Capillary: 165 mg/dL — ABNORMAL HIGH (ref 70–99)

## 2013-03-07 LAB — CBC
MCH: 30.9 pg (ref 26.0–34.0)
MCHC: 33.6 g/dL (ref 30.0–36.0)
Platelets: 174 10*3/uL (ref 150–400)

## 2013-03-07 MED ORDER — ALTEPLASE 2 MG IJ SOLR
2.0000 mg | Freq: Once | INTRAMUSCULAR | Status: AC | PRN
  Filled 2013-03-07: qty 2

## 2013-03-07 MED ORDER — HEPARIN SODIUM (PORCINE) 1000 UNIT/ML DIALYSIS
1000.0000 [IU] | INTRAMUSCULAR | Status: DC | PRN
  Filled 2013-03-07: qty 1

## 2013-03-07 MED ORDER — HEPARIN SODIUM (PORCINE) 1000 UNIT/ML DIALYSIS
20.0000 [IU]/kg | INTRAMUSCULAR | Status: DC | PRN
  Filled 2013-03-07: qty 2

## 2013-03-07 MED ORDER — INSULIN GLARGINE 100 UNIT/ML ~~LOC~~ SOLN
16.0000 [IU] | Freq: Every day | SUBCUTANEOUS | Status: DC
  Administered 2013-03-07: 16 [IU] via SUBCUTANEOUS
  Filled 2013-03-07 (×3): qty 0.16

## 2013-03-07 MED ORDER — NEPRO/CARBSTEADY PO LIQD
237.0000 mL | ORAL | Status: DC | PRN
  Administered 2013-03-08: 1 via ORAL

## 2013-03-07 MED ORDER — SODIUM CHLORIDE 0.9 % IV SOLN: 100.0000 mL | INTRAVENOUS | Status: DC | PRN

## 2013-03-07 MED ORDER — PENTAFLUOROPROP-TETRAFLUOROETH EX AERO: 1.0000 "application " | INHALATION_SPRAY | CUTANEOUS | Status: DC | PRN

## 2013-03-07 MED ORDER — LIDOCAINE-PRILOCAINE 2.5-2.5 % EX CREA: 1.0000 "application " | TOPICAL_CREAM | CUTANEOUS | Status: DC | PRN

## 2013-03-07 MED ORDER — LIDOCAINE HCL (PF) 1 % IJ SOLN: 5.0000 mL | INTRAMUSCULAR | Status: DC | PRN

## 2013-03-07 MED ORDER — WARFARIN SODIUM 5 MG PO TABS
5.0000 mg | ORAL_TABLET | Freq: Once | ORAL | Status: AC
  Administered 2013-03-07: 5 mg via ORAL
  Filled 2013-03-07: qty 1

## 2013-03-07 NOTE — Progress Notes (Signed)
ANTICOAGULATION CONSULT NOTE - Follow Up Consult  Pharmacy Consult for coumadin and heparin Indication: RUE DVT and RIJ thrombus  Allergies  Allergen Reactions  . Penicillins Other (See Comments)    Unknown reaction    Patient Measurements: Height: 6\' 2"  (188 cm) Weight: 136 lb 4 oz (61.803 kg) IBW/kg (Calculated) : 82.2 Heparin Dosing Weight: 65.2 kg  Vital Signs: Temp: 98 F (36.7 C) (11/21 1659) Temp src: Oral (11/21 1358) BP: 162/58 mmHg (11/21 1659) Pulse Rate: 53 (11/21 1659)  Labs:  Recent Labs  03/05/13 0915 03/06/13 0644 03/06/13 0710 03/06/13 2033 03/07/13 0722 03/07/13 1835  HGB 7.9*  --  8.0*  --  8.5*  --   HCT 23.8*  --  24.2*  --  25.3*  --   PLT 166  --  161  --  174  --   LABPROT 20.8*  --  18.5*  --  16.7*  --   INR 1.85*  --  1.59*  --  1.39  --   HEPARINUNFRC  --   --   --  0.85* 0.25* 0.25*  CREATININE 0.76 4.36*  --   --  3.94*  --     Estimated Creatinine Clearance: 18.1 ml/min (by C-G formula based on Cr of 3.94).   Medications:  Scheduled:  . amLODipine  10 mg Oral QHS  . antiseptic oral rinse  15 mL Mouth Rinse QID  . aspirin EC  81 mg Oral Daily  . calcium acetate (Phos Binder)  667 mg Oral TID WC  . cloNIDine  0.2 mg Oral TID  . darbepoetin (ARANESP) injection - DIALYSIS  100 mcg Intravenous Q Wed-HD  . docusate sodium  100 mg Oral BID  . feeding supplement (RESOURCE BREEZE)  1 Container Oral BID BM  . ferric gluconate (FERRLECIT/NULECIT) IV  62.5 mg Intravenous Q Wed-HD  . FLUoxetine  20 mg Oral Daily  . influenza vac split quadrivalent PF  0.5 mL Intramuscular Tomorrow-1000  . insulin aspart  0-15 Units Subcutaneous TID WC  . insulin glargine  16 Units Subcutaneous QHS  . levothyroxine  112 mcg Oral QAC breakfast  . metoprolol  75 mg Oral BID  . multivitamin  1 tablet Oral QHS  . pantoprazole  40 mg Oral Daily  . simvastatin  5 mg Oral q1800  . sodium chloride  3 mL Intravenous Q12H  . temazepam  15 mg Oral Custom  .  Warfarin - Pharmacist Dosing Inpatient   Does not apply q1800   Infusions:  . heparin 1,600 Units/hr (03/07/13 1015)    Assessment: 57 yo male with RUE DVT and RIJ thrombus is currently on subtherapeutic coumadin bridging with subtherapeutic heparin.    PM HL = 0.25  Goal of Therapy:  INR 2-3; 8hr heparin level 0.3-0.5 Monitor platelets by anticoagulation protocol: Yes   Plan:  Increase heparin to 1750 units / hr Follow up AM level  Thank you. Okey Regal, PharmD [phone] 03/07/2013,7:27 PM

## 2013-03-07 NOTE — Evaluation (Signed)
Physical Therapy Evaluation Patient Details Name: Marcus [name] MRN: [id number] DOB: [birth year] Today's Date: 03/07/2013 Time: 5784-6962 PT Time Calculation (min): 22 min  PT Assessment / Plan / Recommendation History of Present Illness  Pt is a 57 y.o adm with PMH of end stage renal disease. He was being anticoagulated for his DVT when he developed acute bleeding from his dialysis graft in his Lt thigh. Pt with RIJ thrombosis as well as collapse of Rt innominate stent. Pt now has new Rt femoral vein cannulation which he received on 11/17. Pt has been up to chair per nursing and is safe to transfer to chair with therapy.    Clinical Impression  Pt adm secondary to above presents with decreased independence with functional mobility secondary to deficits listed below (see PT problem list).  Pt to benefit from skilled PT in acute setting to maximize functional mobility and address deficits listed below. Recommending SNF due to decreased caregiver (A) available for pt; however, pt is refusing SNF at this time. Will recommend HHPT if pt continues to refuse SNF.     PT Assessment  Patient needs continued PT services    Follow Up Recommendations  Home health PT;Supervision/Assistance - 24 hour;Other (comment) (pt refusing SNF at this time)    Does the patient have the potential to tolerate intense rehabilitation      Barriers to Discharge Decreased caregiver support pt needs to be mod I; lives with sister but is alone most part of the day    Equipment Recommendations  None recommended by PT    Recommendations for Other Services OT consult   Frequency Min 3X/week    Precautions / Restrictions Precautions Precautions: Fall Restrictions Weight Bearing Restrictions: No   Pertinent Vitals/Pain 7/10; Rt UE       Mobility  Bed Mobility Bed Mobility: Supine to Sit;Sitting - Scoot to Edge of Bed Supine to Sit: 5: Supervision;HOB elevated;With rails Sitting - Scoot to Edge of Bed: 5:  Supervision Details for Bed Mobility Assistance: cues for hand placement and sequencing; pt relies heavily on handrails; increased time secondary to generalized weakness; decr response to cues  Transfers Transfers: Sit to Stand;Stand to Sit Sit to Stand: 4: Min assist;From bed;With upper extremity assist Stand to Sit: 4: Min assist;To chair/3-in-1;With upper extremity assist;With armrests Details for Transfer Assistance: (A) to maintain balance with transfers; multimodal cues for hand placement and sequencing with RW; pt very unsteady and seems to be unaware of balance deficits Ambulation/Gait Ambulation/Gait Assistance: 4: Min assist Ambulation Distance (Feet): 8 Feet Assistive device: Rolling walker Ambulation/Gait Assistance Details: pt with steppage gt on Rt LE; demo decreased coordination and very unsteady with gt; (A) to maintain balance and help manage RW  Gait Pattern: Wide base of support (steppage gt; decreased heel strike on Rt) Gait velocity: unsafe to increased; decreased gt due to balance deficits  Stairs: No Wheelchair Mobility Wheelchair Mobility: No         PT Diagnosis: Abnormality of gait;Generalized weakness  PT Problem List: Decreased strength;Decreased activity tolerance;Decreased balance;Decreased coordination;Decreased mobility;Decreased knowledge of use of DME;Decreased safety awareness PT Treatment Interventions: DME instruction;Gait training;Functional mobility training;Therapeutic activities;Therapeutic exercise;Balance training;Patient/family education;Neuromuscular re-education     PT Goals(Current goals can be found in the care plan section) Acute Rehab PT Goals Patient Stated Goal: to figure out what is going on PT Goal Formulation: With patient Time For Goal Achievement: 03/21/13 Potential to Achieve Goals: Good  Visit Information  Last PT Received On: 03/07/13 Assistance Needed: +2 (for  safety and for steps) History of Present Illness: Pt is a 57  y.o adm        Prior Functioning  Home Living Family/patient expects to be discharged to:: Private residence Living Arrangements: Other relatives Available Help at Discharge: Family;Friend(s);Available PRN/intermittently Type of Home: Apartment Home Access: Stairs to enter Entrance Stairs-Number of Steps: 4 Entrance Stairs-Rails: Right Home Layout: One level Home Equipment: Walker - 4 wheels;Shower seat Prior Function Level of Independence: Independent with assistive device(s) Communication Communication: No difficulties Dominant Hand: Right    Cognition  Cognition Arousal/Alertness: Awake/alert Behavior During Therapy: Flat affect Overall Cognitive Status: Impaired/Different from baseline Area of Impairment: Orientation;Safety/judgement;Following commands Orientation Level: Situation;Time;Disoriented to Following Commands: Follows one step commands with increased time Safety/Judgement: Decreased awareness of safety;Decreased awareness of deficits General Comments: pt with flat affect; unclear of basline; no family present     Extremity/Trunk Assessment Upper Extremity Assessment Upper Extremity Assessment: Defer to OT evaluation Lower Extremity Assessment Lower Extremity Assessment: RLE deficits/detail;Generalized weakness RLE Deficits / Details: grossly 3-/5 in Rt knee and hip RLE Coordination: decreased gross motor Cervical / Trunk Assessment Cervical / Trunk Assessment: Normal   Balance    End of Session PT - End of Session Equipment Utilized During Treatment: Gait belt Activity Tolerance: Patient tolerated treatment well Patient left: in chair;with call bell/phone within reach Nurse Communication: Mobility status  GP   Donell Sievert, [place] [phone] 03/07/2013, 10:47 AM

## 2013-03-07 NOTE — Progress Notes (Addendum)
NUTRITION FOLLOW UP  DOCUMENTATION CODES Per approved criteria  -Severe malnutrition in the context of chronic illness   Intervention:    Continue Resource Breeze twice daily (250 kcals, 9 gm protein per 8 fl oz carton)  RD to follow for nutrition care plan  Nutrition Dx:   Increased nutrient needs related to HD as evidenced by estimated nutrition needs, ongoing  Goal:   Pt to meet >/= 90% of their estimated nutrition needs   Monitor:   PO & supplemental intake, weight, labs, I/O's  Assessment:   PMHx significant for ESRD (HD on MWF), CVA x 2, hypothyroidism, cocaine abuse. Admitted with worsening R arm swelling x 1 week. Work-up reveals RUE DVT and thrombus in R IJ. Pt is s/p recent thyroidectomy attempt.   Patient s/p procedures 11/17:  LIGATION OF LEFT THIGH ARTERIOVENOUS GRAFT  RIGHT FEMORAL VEIN TUNNELED DIALYSIS CATHETER PLACEMENT  RIGHT FEMORAL VEIN CANNULATION UNDER ULTRASOUND GUIDANCE  Patient transferred from 2S-Surgical ICU to 6E-Renal 11/18.  Patient eating breakfast upon RD visit.  States his appetite is pretty good.  PO intake variable at 45-100% per flowsheet records.  Receiving Resource Breeze supplements twice daily -- reports he is drinking.  Disposition: SNF vs short-term Rehab.  Height: Ht Readings from Last 1 Encounters:  03/05/13 6\' 2"  (1.88 m)    Weight Status:   Wt Readings from Last 1 Encounters:  03/06/13 136 lb 4 oz (61.803 kg)    Re-estimated needs:  Kcal: 1800-2000 Protein: 85-95 gm Fluid: 1.8-2.0 L  Skin: groin, thigh & arm incisions  Diet Order: Dysphagia 3, thin liquids   Intake/Output Summary (Last 24 hours) at 03/07/13 1008 Last data filed at 03/06/13 2034  Gross per 24 hour  Intake 716.45 ml  Output   4750 ml  Net -4033.55 ml    Labs:   Recent Labs Lab 03/03/13 1500  03/05/13 0915 03/06/13 0644 03/07/13 0722  NA 129*  < > 135 128* 129*  K 5.0  < > <2.0* 4.8 4.6  CL 92*  < > 94* 91* 93*  CO2 24  < > 31 22 24    BUN 42*  < > <3* 24* 17  CREATININE 6.85*  < > 0.76 4.36* 3.94*  CALCIUM 8.6  < > 8.2* 8.6 9.0  PHOS 3.9  --   --   --   --   GLUCOSE 169*  < > 129* 374* 272*  < > = values in this interval not displayed.  CBG (last 3)   Recent Labs  03/06/13 2032 03/07/13 0715 03/07/13 0757  GLUCAP 163* 279* 264*    Scheduled Meds: . amLODipine  10 mg Oral QHS  . antiseptic oral rinse  15 mL Mouth Rinse QID  . aspirin EC  81 mg Oral Daily  . calcium acetate (Phos Binder)  667 mg Oral TID WC  . cloNIDine  0.2 mg Oral TID  . darbepoetin (ARANESP) injection - DIALYSIS  100 mcg Intravenous Q Wed-HD  . docusate sodium  100 mg Oral BID  . feeding supplement (RESOURCE BREEZE)  1 Container Oral BID BM  . ferric gluconate (FERRLECIT/NULECIT) IV  62.5 mg Intravenous Q Wed-HD  . FLUoxetine  20 mg Oral Daily  . influenza vac split quadrivalent PF  0.5 mL Intramuscular Tomorrow-1000  . insulin aspart  0-15 Units Subcutaneous TID WC  . insulin glargine  16 Units Subcutaneous QHS  . levothyroxine  112 mcg Oral QAC breakfast  . metoprolol  75 mg Oral BID  .  multivitamin  1 tablet Oral QHS  . pantoprazole  40 mg Oral Daily  . simvastatin  5 mg Oral q1800  . sodium chloride  3 mL Intravenous Q12H  . temazepam  15 mg Oral Custom  . warfarin  5 mg Oral ONCE-1800  . Warfarin - Pharmacist Dosing Inpatient   Does not apply q1800    Continuous Infusions: . heparin 1,500 Units/hr (03/07/13 0246)    Maureen Chatters, RD, LDN Pager #: [phone] After-Hours Pager #: [phone]

## 2013-03-07 NOTE — Progress Notes (Signed)
[facility] Progress Note  Subjective:   Breathing and swallowing much better than yesterday.  Never sleeps supine  Objective Filed Vitals:   03/06/13 1730 03/06/13 1800 03/06/13 2030 03/07/13 0535  BP: 145/76 128/65 129/55 170/72  Pulse: 78 57 54 55  Temp: 97.5 F (36.4 C) 97.6 F (36.4 C) 98.5 F (36.9 C) 98.3 F (36.8 C)  TempSrc: Oral  Oral Oral  Resp: 16 16 16 17   Height:      Weight: 61.7 kg (136 lb 0.4 oz)  61.803 kg (136 lb 4 oz)   SpO2: 94% 100% 100% 100%   Physical Exam General: NAD eating breakfast - small amount Heart: RRR Lungs: no wheezes or rales Abdomen: soft NT active BS Extremities: no sig LE edema; right arm marked swelling Dialysis Access: Rt fem TDC (RFA AVF ligated, L thigh AVG ligated)   Dialysis Orders:  MWF  4hr 15 min 61kg 2K/2.25Ca Heparin 2400 R thigh cath  Hectorol 0 (pth 56) Epogen 6800 Venofer 50mg  per week   Assessment/Plan:  1. RUE edema/ RUE DVT (IJ, axillary, brachiocephalic veins): R arm AVG ligated 11/13 due to venous HTN, Hep/Coumadin per primary 2. L thigh AVG bleed - ligated 11/17, intraop cx's negative 3. ESRD: MWF HD Thursday for vol xs.UF 4.9 Wed and 4.7 on Thurs with post weight of  61.8 close to edw; plan next HD Sat and then get back on schedule on Monday 4. HTN/volume excess - SBPs better on clonidine, norvasc and metroprolol. pt counseled about fluid restriction. May be able to decrease meds with lower edw - goal 3-4 for Sat. 5. Anemia - Hgb 8.0 > 7.6 s/p 2 units PRBCs.> 8.5 Continue Aranesp 100 q Wed. Weekly Fe on Tuesdays. Transfuse PRN  6. Metabolic bone disease - Ca and phos controlled. Phoslyra here due to swallowing difficulty. No Vit D.  7. Nutrition - Dys 3 renal diet with fluid restr, multivitamin 8. Goiter - Dr. Gerrit Friends managing, not a surgical candidate 9. DM Type 1 - per primary; Na still low, but glu 272 this am 10. Dispo - tells me he lives with his sister and her friend, need to verify;  INR follow up with PCP. Rx for Phoslyra 5ml ac if still having trouble swallowing  Sheffield Slider, PA-C [facility] Beeper [phone] 03/07/2013,8:38 AM  LOS: 10 days   I have seen and examined patient, discussed with PA and agree with assessment and plan as outlined above. [name] MD pager [phone]    cell [phone] 03/07/2013, 2:23 PM     Additional Objective Labs: Basic Metabolic Panel: Lab Results  Component Value Date   INR 1.39 03/07/2013   INR 1.59* 03/06/2013   INR 1.85* 03/05/2013     Recent Labs Lab 02/28/13 0840  03/03/13 1500  03/05/13 0915 03/06/13 0644 03/07/13 0722  NA 127*  < > 129*  < > 135 128* 129*  K 4.5  < > 5.0  < > <2.0* 4.8 4.6  CL 88*  < > 92*  < > 94* 91* 93*  CO2 24  < > 24  < > 31 22 24   GLUCOSE 171*  < > 169*  < > 129* 374* 272*  BUN 29*  < > 42*  < > <3* 24* 17  CREATININE 5.38*  < > 6.85*  < > 0.76 4.36* 3.94*  CALCIUM 8.8  < > 8.6  < > 8.2* 8.6 9.0  PHOS 3.7  --  3.9  --   --   --   --   < > =  values in this interval not displayed. Liver Function Tests:  Recent Labs Lab 02/28/13 0840 03/03/13 1500  ALBUMIN 3.1* 2.6*   CBC:  Recent Labs Lab 03/03/13 1525 03/04/13 0822 03/05/13 0915 03/06/13 0710 03/07/13 0722  WBC 4.2 5.0 5.3 4.7 4.2  NEUTROABS  --  3.0  --   --   --   HGB 7.6* 8.2* 7.9* 8.0* 8.5*  HCT 23.4* 24.9* 23.8* 24.2* 25.3*  MCV 92.5 92.9 91.5 92.0 92.0  PLT 160 168 166 161 174   Blood Culture    Component Value Date/Time   SDES WOUND LEFT THIGH GRAFT 03/03/2013 0246   SDES WOUND LEFT THIGH GRAFT 03/03/2013 0246   SPECREQUEST NONE PT ON VANCOMYCIN 03/03/2013 0246   SPECREQUEST PT ON VANCOMYCIN 03/03/2013 0246   CULT  Value: NO GROWTH 2 DAYS Performed at [facility] 03/03/2013 0246   CULT  Value: NO ANAEROBES ISOLATED; CULTURE IN PROGRESS FOR 5 DAYS Performed at [facility] 03/03/2013 0246   REPTSTATUS 03/05/2013 FINAL 03/03/2013 0246   REPTSTATUS PENDING  03/03/2013 0246  CBG:  Recent Labs Lab 03/06/13 1134 03/06/13 1758 03/06/13 2032 03/07/13 0715 03/07/13 0757  GLUCAP 487* 100* 163* 279* 264*  Medications: . heparin 1,500 Units/hr (03/07/13 0246)   . amLODipine  10 mg Oral QHS  . antiseptic oral rinse  15 mL Mouth Rinse QID  . aspirin EC  81 mg Oral Daily  . calcium acetate (Phos Binder)  667 mg Oral TID WC  . cloNIDine  0.2 mg Oral TID  . darbepoetin (ARANESP) injection - DIALYSIS  100 mcg Intravenous Q Wed-HD  . docusate sodium  100 mg Oral BID  . feeding supplement (RESOURCE BREEZE)  1 Container Oral BID BM  . ferric gluconate (FERRLECIT/NULECIT) IV  62.5 mg Intravenous Q Wed-HD  . FLUoxetine  20 mg Oral Daily  . influenza vac split quadrivalent PF  0.5 mL Intramuscular Tomorrow-1000  . insulin aspart  0-15 Units Subcutaneous TID WC  . insulin glargine  16 Units Subcutaneous QHS  . levothyroxine  112 mcg Oral QAC breakfast  . metoprolol  75 mg Oral BID  . multivitamin  1 tablet Oral QHS  . pantoprazole  40 mg Oral Daily  . simvastatin  5 mg Oral q1800  . sodium chloride  3 mL Intravenous Q12H  . temazepam  15 mg Oral Custom  . Warfarin - Pharmacist Dosing Inpatient   Does not apply [phone]

## 2013-03-07 NOTE — Progress Notes (Signed)
ANTICOAGULATION CONSULT NOTE - Follow Up Consult  Pharmacy Consult for coumadin and heparin Indication: RUE DVT and RIJ thrombus  Allergies  Allergen Reactions  . Penicillins Other (See Comments)    Unknown reaction    Patient Measurements: Height: 6\' 2"  (188 cm) Weight: 136 lb 4 oz (61.803 kg) IBW/kg (Calculated) : 82.2 Heparin Dosing Weight: 65.2 kg  Vital Signs: Temp: 98.5 F (36.9 C) (11/21 0857) Temp src: Oral (11/21 0857) BP: 153/72 mmHg (11/21 0857) Pulse Rate: 54 (11/21 0857)  Labs:  Recent Labs  03/05/13 0915 03/06/13 0644 03/06/13 0710 03/06/13 2033 03/07/13 0722  HGB 7.9*  --  8.0*  --  8.5*  HCT 23.8*  --  24.2*  --  25.3*  PLT 166  --  161  --  174  LABPROT 20.8*  --  18.5*  --  16.7*  INR 1.85*  --  1.59*  --  1.39  HEPARINUNFRC  --   --   --  0.85* 0.25*  CREATININE 0.76 4.36*  --   --  3.94*    Estimated Creatinine Clearance: 18.1 ml/min (by C-G formula based on Cr of 3.94).   Medications:  Scheduled:  . amLODipine  10 mg Oral QHS  . antiseptic oral rinse  15 mL Mouth Rinse QID  . aspirin EC  81 mg Oral Daily  . calcium acetate (Phos Binder)  667 mg Oral TID WC  . cloNIDine  0.2 mg Oral TID  . darbepoetin (ARANESP) injection - DIALYSIS  100 mcg Intravenous Q Wed-HD  . docusate sodium  100 mg Oral BID  . feeding supplement (RESOURCE BREEZE)  1 Container Oral BID BM  . ferric gluconate (FERRLECIT/NULECIT) IV  62.5 mg Intravenous Q Wed-HD  . FLUoxetine  20 mg Oral Daily  . influenza vac split quadrivalent PF  0.5 mL Intramuscular Tomorrow-1000  . insulin aspart  0-15 Units Subcutaneous TID WC  . insulin glargine  16 Units Subcutaneous QHS  . levothyroxine  112 mcg Oral QAC breakfast  . metoprolol  75 mg Oral BID  . multivitamin  1 tablet Oral QHS  . pantoprazole  40 mg Oral Daily  . simvastatin  5 mg Oral q1800  . sodium chloride  3 mL Intravenous Q12H  . temazepam  15 mg Oral Custom  . Warfarin - Pharmacist Dosing Inpatient   Does not  apply q1800   Infusions:  . heparin 1,500 Units/hr (03/07/13 0246)    Assessment: 57 yo male with RUE DVT and RIJ thrombus is currently on subtherapeutic coumadin bridging with subtherapeutic heparin.  Heparin level is 0.25 and INR 1.39 (probably due to the 1 mg that he got few days ago).  No bleeding noted.  CBC stable. Goal of Therapy:  INR 2-3; 8hr heparin level 0.3-0.5 Monitor platelets by anticoagulation protocol: Yes   Plan:  Inc heparin to 1600 units/hr. will aim for 0.3-0.5 given recent bleed. 8hr heparin level - Warfarin 5 mg x 1 - Monitor for bleeding   Rilley Poulter, Tsz-Yin 03/07/2013,10:01 AM

## 2013-03-08 LAB — ANAEROBIC CULTURE: Gram Stain: NONE SEEN

## 2013-03-08 LAB — GLUCOSE, CAPILLARY: Glucose-Capillary: 128 mg/dL — ABNORMAL HIGH (ref 70–99)

## 2013-03-08 LAB — RENAL FUNCTION PANEL
Albumin: 2.8 g/dL — ABNORMAL LOW (ref 3.5–5.2)
BUN: 27 mg/dL — ABNORMAL HIGH (ref 6–23)
CO2: 25 mEq/L (ref 19–32)
Calcium: 8.8 mg/dL (ref 8.4–10.5)
Chloride: 95 mEq/L — ABNORMAL LOW (ref 96–112)
Creatinine, Ser: 5.62 mg/dL — ABNORMAL HIGH (ref 0.50–1.35)
GFR calc Af Amer: 12 mL/min — ABNORMAL LOW (ref >90)
GFR calc non Af Amer: 10 mL/min — ABNORMAL LOW (ref >90)
Glucose, Bld: 102 mg/dL — ABNORMAL HIGH (ref 70–99)
Phosphorus: 4.5 mg/dL (ref 2.3–4.6)
Potassium: 4.7 mEq/L (ref 3.5–5.1)
Sodium: 133 mEq/L — ABNORMAL LOW (ref 135–145)

## 2013-03-08 LAB — PROTIME-INR
INR: 1.47 (ref 0.00–1.49)
Prothrombin Time: 17.4 seconds — ABNORMAL HIGH (ref 11.6–15.2)

## 2013-03-08 LAB — CBC
HCT: 23.9 % — ABNORMAL LOW (ref 39.0–52.0)
MCH: 30.3 pg (ref 26.0–34.0)
MCV: 94.1 fL (ref 78.0–100.0)
RBC: 2.54 MIL/uL — ABNORMAL LOW (ref 4.22–5.81)
RDW: 15.1 % (ref 11.5–15.5)
WBC: 6 10*3/uL (ref 4.0–10.5)

## 2013-03-08 MED ORDER — SODIUM CHLORIDE 0.9 % IV SOLN
100.0000 mL | INTRAVENOUS | Status: DC | PRN
  Administered 2013-03-08: 10 mL via INTRAVENOUS

## 2013-03-08 MED ORDER — DARBEPOETIN ALFA-POLYSORBATE 100 MCG/0.5ML IJ SOLN
INTRAMUSCULAR | Status: AC
  Filled 2013-03-08: qty 0.5

## 2013-03-08 MED ORDER — HEPARIN SODIUM (PORCINE) 1000 UNIT/ML DIALYSIS: 1000.0000 [IU] | INTRAMUSCULAR | Status: DC | PRN

## 2013-03-08 MED ORDER — SODIUM CHLORIDE 0.9 % IV SOLN: 100.0000 mL | INTRAVENOUS | Status: DC | PRN

## 2013-03-08 MED ORDER — PENTAFLUOROPROP-TETRAFLUOROETH EX AERO: 1.0000 "application " | INHALATION_SPRAY | CUTANEOUS | Status: DC | PRN

## 2013-03-08 MED ORDER — NEPRO/CARBSTEADY PO LIQD: 237.0000 mL | ORAL | Status: DC | PRN

## 2013-03-08 MED ORDER — POLYETHYLENE GLYCOL 3350 17 G PO PACK
17.0000 g | PACK | Freq: Two times a day (BID) | ORAL | Status: DC
  Administered 2013-03-08 – 2013-03-11 (×6): 17 g via ORAL
  Filled 2013-03-08 (×8): qty 1

## 2013-03-08 MED ORDER — LIDOCAINE HCL (PF) 1 % IJ SOLN: 5.0000 mL | INTRAMUSCULAR | Status: DC | PRN

## 2013-03-08 MED ORDER — LIDOCAINE-PRILOCAINE 2.5-2.5 % EX CREA: 1.0000 "application " | TOPICAL_CREAM | CUTANEOUS | Status: DC | PRN

## 2013-03-08 MED ORDER — WARFARIN SODIUM 5 MG PO TABS
5.0000 mg | ORAL_TABLET | Freq: Once | ORAL | Status: AC
  Administered 2013-03-08: 5 mg via ORAL
  Filled 2013-03-08: qty 1

## 2013-03-08 MED ORDER — ALTEPLASE 2 MG IJ SOLR: 2.0000 mg | Freq: Once | INTRAMUSCULAR | Status: AC | PRN

## 2013-03-08 NOTE — Progress Notes (Signed)
[facility] PROGRESS NOTE  Marcus [name] ZOX:[id number] DOB: 1956/04/07 DOA: 02/25/2013 PCP: Marcus Proffer, MD  Brief narrative 57 y.o. male with h/o ESRD (HD MWF) who presents to [facility] with worsening R arm swelling over the past week. His current HD access is in his L thigh and unrelated to this. Patient had a recent  attempt at thyroidectomy performed last month by Dr. Gerrit Friends. 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   In the ED at [facility] he was found to have not only RUE DVT but thrombus in his R IJ as well. The enlargement of the thyroid appears to be extending down into his thoracic cavity (unchanged since prior CT). 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  [facility].  Patient was seen by vascular surgery and underwent ligation of right forearm AV graft. Post procedure he was tolerating this well and started on Coumadin when on the evening of 11/16, patient started having significant bleeding from the left thigh AV graft. Patient was taken emergently to the OR where he underwent ligation of this graft. In addition and underwent right femoral vein cannulation and tunneled dialysis catheter was placed. He was monitored in the surgical ICU on 11/17-18 and then transferred back to nephrology floor.    Assessment/Plan:  Rt IJ thrombus/RUE DVT -was on heparin gtt, which was then on hold due to bleeding fistula,  -INR therapeutic 11/18 then trended down and hence coumadin being bridged with heparin -INR still subtherapeutic, pharmacy dosing -Possibly from direct compression of IJ by enlarged thyroid.   -per VVS ok to resume anticoagulation -Appreciate consultation by general surgery, vascular surgery and interventional radiology.  -s/p  Ligation of right AV graft done last week  Left thigh graft bleed:  -resolved -Greatly appreciate vascular surgery and critical care help.  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 Per Dr.Gerkin -given recent complications, not felt to be a surgical candidate per [facility] at this time -FU with endocrinology regarding options namely radioactive iodine/XRT, will d/w Endocrine -TSH significantly elevated, but T4 slightly up, check T3 -CT reports unchanged airway compression. patient in no resp distress. does c/o some new dysphasia, seen by ST has seen and put on dysphagia 3 diet  ESRD on HD ( M,W,F)  renal following .blood from left thigh graft. See above. HD cath R leg now  Type 1 DM  -CBG trending up, restart lantus, SSI  HTN  continue home meds   Hx of CVA  continue ASA, statin   GERD  continue PPI   DIET: dys level 3   DVT prophylaxis: on anticoagulation.  Ambulate/PT/OT  Code Status: full code  Family Communication:none at bedside Disposition Plan: home vs ST rehab   Consultants:  [facility]  vascular surgery ( Dr Darrick Penna)  IR Critical care   Procedures:  For right IJ thrombus ligation done 11/13 Ligation of left AV graft secondary to bleed done 11/17. Placement of tunneled catheter on right leg 11/17  Antibiotics:  None  HPI/Subjective:  Denies any complaints today   Objective: Filed Vitals:   03/08/13 1105  BP: 160/81  Pulse: 50  Temp: 97.7 F (36.5  C)  Resp: 15    Intake/Output Summary (Last 24 hours) at 03/08/13 1710 Last data filed at 03/08/13 1600  Gross per 24 hour  Intake  616.5 ml  Output   3350 ml  Net -2733.5 ml   Filed Weights   03/07/13 2020 03/08/13 0645 03/08/13 1105  Weight: 61.803 kg (136 lb 4 oz) 62.8 kg (138 lb 7.2 oz) 59.4 kg (130 lb 15.3 oz)    Exam: General: alert and oriented x2, no acute distress HEENT: no  pallor, moist mucosa, enlarged thyroid  Chest:lungs clear auscultation bilaterally  CVS: Regular rate and rhythm, S1-S2 Abd: Soft, nontender, nondistended, positive bowel sounds  Ext: swollen rt arm and forearm , tunneled catheter-right leg   Data Reviewed: Basic Metabolic Panel:  Recent Labs Lab 03/03/13 1500 03/04/13 0822 03/05/13 0915 03/06/13 0644 03/07/13 0722 03/08/13 0728  NA 129* 128* 135 128* 129* 133*  K 5.0 4.1 <2.0* 4.8 4.6 4.7  CL 92* 92* 94* 91* 93* 95*  CO2 24 25 31 22 24 25   GLUCOSE 169* 255* 129* 374* 272* 102*  BUN 42* 23 <3* 24* 17 27*  CREATININE 6.85* 4.77* 0.76 4.36* 3.94* 5.62*  CALCIUM 8.6 8.8 8.2* 8.6 9.0 8.8  PHOS 3.9  --   --   --   --  4.5   Liver Function Tests:  Recent Labs Lab 03/03/13 1500 03/08/13 0728  ALBUMIN 2.6* 2.8*   No results found for this basename: LIPASE, AMYLASE,  in the last 168 hours No results found for this basename: AMMONIA,  in the last 168 hours CBC:  Recent Labs Lab 03/04/13 0822 03/05/13 0915 03/06/13 0710 03/07/13 0722 03/08/13 0441  WBC 5.0 5.3 4.7 4.2 6.0  NEUTROABS 3.0  --   --   --   --   HGB 8.2* 7.9* 8.0* 8.5* 7.7*  HCT 24.9* 23.8* 24.2* 25.3* 23.9*  MCV 92.9 91.5 92.0 92.0 94.1  PLT 168 166 161 174 195   Cardiac Enzymes: No results found for this basename: CKTOTAL, CKMB, CKMBINDEX, TROPONINI,  in the last 168 hours BNP (last 3 results) No results found for this basename: PROBNP,  in the last 8760 hours CBG:  Recent Labs Lab 03/07/13 1726 03/07/13 2020 03/08/13 1159 03/08/13 1315 03/08/13 1646  GLUCAP 165* 150* 54* 136* 310*    Recent Results (from the past 240 hour(s))  WOUND CULTURE     Status: None   Collection Time    03/03/13  2:46 AM      Result Value Range Status   Specimen Description WOUND LEFT THIGH GRAFT   Final   Special Requests NONE PT ON VANCOMYCIN   Final   Gram Stain     Final   Value: NO WBC SEEN     NO SQUAMOUS EPITHELIAL CELLS SEEN     NO ORGANISMS SEEN      Performed at [facility]   Culture     Final   Value: NO GROWTH 2 DAYS     Performed at [facility]   Report Status 03/05/2013 FINAL   Final  ANAEROBIC CULTURE     Status: None   Collection Time    03/03/13  2:46 AM      Result Value Range Status   Specimen Description WOUND LEFT THIGH GRAFT   Final   Special Requests PT ON VANCOMYCIN   Final   Gram Stain     Final   Value: NO WBC SEEN  NO SQUAMOUS EPITHELIAL CELLS SEEN     NO ORGANISMS SEEN     Performed at [facility]   Culture     Final   Value: NO ANAEROBES ISOLATED     Performed at [facility]   Report Status 03/08/2013 FINAL   Final     Studies: No results found.  Scheduled Meds: . amLODipine  10 mg Oral QHS  . aspirin EC  81 mg Oral Daily  . calcium acetate (Phos Binder)  667 mg Oral TID WC  . cloNIDine  0.2 mg Oral TID  . darbepoetin (ARANESP) injection - DIALYSIS  100 mcg Intravenous Q Wed-HD  . docusate sodium  100 mg Oral BID  . feeding supplement (RESOURCE BREEZE)  1 Container Oral BID BM  . ferric gluconate (FERRLECIT/NULECIT) IV  62.5 mg Intravenous Q Wed-HD  . FLUoxetine  20 mg Oral Daily  . insulin aspart  0-15 Units Subcutaneous TID WC  . insulin glargine  16 Units Subcutaneous QHS  . levothyroxine  112 mcg Oral QAC breakfast  . metoprolol  75 mg Oral BID  . multivitamin  1 tablet Oral QHS  . pantoprazole  40 mg Oral Daily  . polyethylene glycol  17 g Oral BID  . simvastatin  5 mg Oral q1800  . sodium chloride  3 mL Intravenous Q12H  . temazepam  15 mg Oral Custom  . Warfarin - Pharmacist Dosing Inpatient   Does not apply q1800   Continuous Infusions: . heparin 1,650 Units/hr (03/08/13 1600)      Time spent: 20 minutes    Malon Siddall  [facility] Pager [phone] If 7PM-7AM, please contact night-coverage at [email or web address], password [facility] 03/08/2013, 5:10 PM  LOS: 11 days

## 2013-03-08 NOTE — Progress Notes (Signed)
ANTICOAGULATION CONSULT NOTE - Follow Up Consult  Pharmacy Consult for coumadin and heparin Indication: RUE DVT and RIJ thrombus  Allergies  Allergen Reactions  . Penicillins Other (See Comments)    Unknown reaction    Patient Measurements: Height: 6\' 2"  (188 cm) Weight: 130 lb 15.3 oz (59.4 kg) IBW/kg (Calculated) : 82.2 Heparin Dosing Weight: 59.4 kg  Vital Signs: Temp: 97.7 F (36.5 C) (11/22 1105) Temp src: Oral (11/22 1105) BP: 160/81 mmHg (11/22 1105) Pulse Rate: 50 (11/22 1105)  Labs:  Recent Labs  03/06/13 0644  03/06/13 0710  03/07/13 0722 03/07/13 1835 03/08/13 0441 03/08/13 0728 03/08/13 1220  HGB  --   < > 8.0*  --  8.5*  --  7.7*  --   --   HCT  --   --  24.2*  --  25.3*  --  23.9*  --   --   PLT  --   --  161  --  174  --  195  --   --   LABPROT  --   --  18.5*  --  16.7*  --   --   --  17.4*  INR  --   --  1.59*  --  1.39  --   --   --  1.47  HEPARINUNFRC  --   --   --   < > 0.25* 0.25*  --   --  0.66  CREATININE 4.36*  --   --   --  3.94*  --   --  5.62*  --   < > = values in this interval not displayed.  Estimated Creatinine Clearance: 12.2 ml/min (by C-G formula based on Cr of 5.62).   Medications:  Scheduled:  . amLODipine  10 mg Oral QHS  . antiseptic oral rinse  15 mL Mouth Rinse QID  . aspirin EC  81 mg Oral Daily  . calcium acetate (Phos Binder)  667 mg Oral TID WC  . cloNIDine  0.2 mg Oral TID  . darbepoetin (ARANESP) injection - DIALYSIS  100 mcg Intravenous Q Wed-HD  . docusate sodium  100 mg Oral BID  . feeding supplement (RESOURCE BREEZE)  1 Container Oral BID BM  . ferric gluconate (FERRLECIT/NULECIT) IV  62.5 mg Intravenous Q Wed-HD  . FLUoxetine  20 mg Oral Daily  . influenza vac split quadrivalent PF  0.5 mL Intramuscular Tomorrow-1000  . insulin aspart  0-15 Units Subcutaneous TID WC  . insulin glargine  16 Units Subcutaneous QHS  . levothyroxine  112 mcg Oral QAC breakfast  . metoprolol  75 mg Oral BID  . multivitamin   1 tablet Oral QHS  . pantoprazole  40 mg Oral Daily  . polyethylene glycol  17 g Oral BID  . simvastatin  5 mg Oral q1800  . sodium chloride  3 mL Intravenous Q12H  . temazepam  15 mg Oral Custom  . Warfarin - Pharmacist Dosing Inpatient   Does not apply q1800   Infusions:  . heparin 1,750 Units/hr (03/08/13 1244)    Assessment: 57 yo male with RUE DVT and RIJ thrombus is currently on subtherapeutic coumadin bridging with supratherapeutic heparin.  INR is 1.47 and heparin level is 0.66.  Per RN, the heparin drip was turned off by the dialysis nurse for a bit because the pump was beeping and the level was drawn after that. Goal of Therapy:  Heparin level 0.3-0.5 units/ml; INR 2-3 Monitor platelets by anticoagulation protocol: Yes  Plan:  1) Reduce heparin to 1650 units/hr.  Check an 8hr heparin level  2) Coumadin 5mg  po x1 3) Daily INR  Hermione Havlicek, Tsz-Yin 03/08/2013,1:46 PM

## 2013-03-08 NOTE — Procedures (Signed)
I was present at this dialysis session, have reviewed the session itself and made  appropriate changes   [name] MD  pager [phone]    cell [phone]  03/08/2013, 8:05 AM

## 2013-03-08 NOTE — Progress Notes (Signed)
[facility] Progress Note  Subjective:  No complaints, arm swelling is better  Objective Filed Vitals:   03/08/13 0645 03/08/13 0650 03/08/13 0655 03/08/13 0730  BP: 139/71 174/87 176/84 176/81  Pulse: 51 48 48 48  Temp: 97.6 F (36.4 C)     TempSrc: Oral     Resp: 16 14    Height:      Weight: 62.8 kg (138 lb 7.2 oz)     SpO2: 99%      Physical Exam General: on dialysis, no distress Heart: RRR no M or rub Lungs: no wheezes or rales, clear bilat Abdomen: soft NT active BS Extremities: R arm edema improved, L arm edema down as well, no LE edema Dialysis Access: Rt fem TDC (RFA AVF ligated, L thigh AVG ligated)   Dialysis Orders: [place] MWF  4:15 hrs   61kg    2K/2.25Ca    Heparin 2400    R thigh cath  Hectorol none (pth 56) Epogen 6800 Venofer 50mg  per week   Assessment/Plan:  1. RUE DVT: R arm AVG ligated 11/13 due to venous HTN, Hep/Coumadin per primary 2. L thigh AVG bleed - ligated 11/17, intraop cx's negative 3. ESRD: HD today then again tomorrow (holiday schedule) 4. HTN/volume: SBPs better on clonidine, norvasc and metroprolol; volume much better, close to dry wt 5. Anemia - Hgb 8.0 > 7.6 s/p 2 units PRBCs.> 8.5 Continue Aranesp 100 q Wed. Weekly Fe on Tuesdays. Transfuse PRN  6. Metabolic bone disease - Ca and phos controlled. Phoslyra here due to swallowing difficulty. No Vit D.  7. Nutrition - Dys 3 renal diet with fluid restr, multivitamin 8. Goiter - Dr. Gerrit Friends managing, not a surgical candidate 9. DM Type 1 - per primary; Na still low, but glu 272 this am 10. Dispo - lives with his sister and her friend, need to verify; INR follow up with PCP. Rx for Phoslyra 5ml ac if still having trouble swallowing  [name] MD pager [phone]    cell [phone] 03/08/2013, 8:05 AM     Additional Objective Labs: Basic Metabolic Panel: Lab Results  Component Value Date   INR 1.39 03/07/2013   INR 1.59* 03/06/2013   INR 1.85* 03/05/2013      Recent Labs Lab 03/03/13 1500  03/05/13 0915 03/06/13 0644 03/07/13 0722  NA 129*  < > 135 128* 129*  K 5.0  < > <2.0* 4.8 4.6  CL 92*  < > 94* 91* 93*  CO2 24  < > 31 22 24   GLUCOSE 169*  < > 129* 374* 272*  BUN 42*  < > <3* 24* 17  CREATININE 6.85*  < > 0.76 4.36* 3.94*  CALCIUM 8.6  < > 8.2* 8.6 9.0  PHOS 3.9  --   --   --   --   < > = values in this interval not displayed. Liver Function Tests:  Recent Labs Lab 03/03/13 1500  ALBUMIN 2.6*   CBC:  Recent Labs Lab 03/04/13 0822 03/05/13 0915 03/06/13 0710 03/07/13 0722 03/08/13 0441  WBC 5.0 5.3 4.7 4.2 6.0  NEUTROABS 3.0  --   --   --   --   HGB 8.2* 7.9* 8.0* 8.5* 7.7*  HCT 24.9* 23.8* 24.2* 25.3* 23.9*  MCV 92.9 91.5 92.0 92.0 94.1  PLT 168 166 161 174 195   Blood Culture    Component Value Date/Time   SDES WOUND LEFT THIGH GRAFT 03/03/2013 0246   SDES WOUND LEFT THIGH  GRAFT 03/03/2013 0246   SPECREQUEST NONE PT ON VANCOMYCIN 03/03/2013 0246   SPECREQUEST PT ON VANCOMYCIN 03/03/2013 0246   CULT  Value: NO GROWTH 2 DAYS Performed at [facility] 03/03/2013 0246   CULT  Value: NO ANAEROBES ISOLATED; CULTURE IN PROGRESS FOR 5 DAYS Performed at [facility] 03/03/2013 0246   REPTSTATUS 03/05/2013 FINAL 03/03/2013 0246   REPTSTATUS PENDING 03/03/2013 0246  CBG:  Recent Labs Lab 03/07/13 0715 03/07/13 0757 03/07/13 1231 03/07/13 1726 03/07/13 2020  GLUCAP 279* 264* 118* 165* 150*  Medications: . heparin 1,750 Units/hr (03/07/13 2226)   . amLODipine  10 mg Oral QHS  . antiseptic oral rinse  15 mL Mouth Rinse QID  . aspirin EC  81 mg Oral Daily  . calcium acetate (Phos Binder)  667 mg Oral TID WC  . cloNIDine  0.2 mg Oral TID  . darbepoetin (ARANESP) injection - DIALYSIS  100 mcg Intravenous Q Wed-HD  . docusate sodium  100 mg Oral BID  . feeding supplement (RESOURCE BREEZE)  1 Container Oral BID BM  . ferric gluconate (FERRLECIT/NULECIT) IV  62.5 mg Intravenous Q Wed-HD  .  FLUoxetine  20 mg Oral Daily  . influenza vac split quadrivalent PF  0.5 mL Intramuscular Tomorrow-1000  . insulin aspart  0-15 Units Subcutaneous TID WC  . insulin glargine  16 Units Subcutaneous QHS  . levothyroxine  112 mcg Oral QAC breakfast  . metoprolol  75 mg Oral BID  . multivitamin  1 tablet Oral QHS  . pantoprazole  40 mg Oral Daily  . simvastatin  5 mg Oral q1800  . sodium chloride  3 mL Intravenous Q12H  . temazepam  15 mg Oral Custom  . Warfarin - Pharmacist Dosing Inpatient   Does not apply [phone]

## 2013-03-08 NOTE — Progress Notes (Signed)
5 Days Post-Op  Subjective: Pt sitting up in chair eating breakfast.  On coumadin and heparin for DVT.  Objective: Vital signs in last 24 hours: Temp:  [97.6 F (36.4 C)-98.5 F (36.9 C)] 97.7 F (36.5 C) (11/22 1105) Pulse Rate:  [48-53] 50 (11/22 1105) Resp:  [14-17] 15 (11/22 1105) BP: (138-185)/(58-94) 160/81 mmHg (11/22 1105) SpO2:  [99 %-100 %] 99 % (11/22 1105) Weight:  [130 lb 15.3 oz (59.4 kg)-138 lb 7.2 oz (62.8 kg)] 130 lb 15.3 oz (59.4 kg) (11/22 1105) Last BM Date: 03/07/13  Intake/Output from previous day: 11/21 0701 - 11/22 0700 In: 724.8 [P.O.:600; I.V.:124.8] Out: -  Intake/Output this shift: Total I/O In: -  Out: 3350 [Other:3350]  Incision/Wound: neck wound healing well.  No signs of infection.   Lab Results:   Recent Labs  03/07/13 0722 03/08/13 0441  WBC 4.2 6.0  HGB 8.5* 7.7*  HCT 25.3* 23.9*  PLT 174 195   BMET  Recent Labs  03/07/13 0722 03/08/13 0728  NA 129* 133*  K 4.6 4.7  CL 93* 95*  CO2 24 25  GLUCOSE 272* 102*  BUN 17 27*  CREATININE 3.94* 5.62*  CALCIUM 9.0 8.8   PT/INR  Recent Labs  03/06/13 0710 03/07/13 0722  LABPROT 18.5* 16.7*  INR 1.59* 1.39   ABG No results found for this basename: PHART, PCO2, PO2, HCO3,  in the last 72 hours  Studies/Results: No results found.  Anti-infectives: Anti-infectives   Start     Dose/Rate Route Frequency Ordered Stop   02/27/13 0600  [MAR Hold]  vancomycin (VANCOCIN) powder 1,000 mg     (On MAR Hold since 02/27/13 0842)   1,000 mg Other To Surgery 02/25/13 1522 02/27/13 1025      Assessment/Plan: Thyroid mass Pt not a good surgical candidate for a thyroidectomy.  Dr.  Gerrit Friends to discuss with Endocrinology regarding possible suppressive therapy or radioactive iodine therapy after hospitalization.   [facility] will follow along.    LOS: 11 days    Raylin Diguglielmo ANP-BC 03/08/2013 12:40 PM

## 2013-03-09 LAB — RENAL FUNCTION PANEL
Albumin: 3 g/dL — ABNORMAL LOW (ref 3.5–5.2)
BUN: 18 mg/dL (ref 6–23)
Calcium: 9.1 mg/dL (ref 8.4–10.5)
Creatinine, Ser: 4.11 mg/dL — ABNORMAL HIGH (ref 0.50–1.35)
GFR calc Af Amer: 17 mL/min — ABNORMAL LOW (ref >90)
GFR calc non Af Amer: 15 mL/min — ABNORMAL LOW (ref >90)
Phosphorus: 4.1 mg/dL (ref 2.3–4.6)

## 2013-03-09 LAB — CBC
Hemoglobin: 8.7 g/dL — ABNORMAL LOW (ref 13.0–17.0)
Platelets: 244 10*3/uL (ref 150–400)
RBC: 2.82 MIL/uL — ABNORMAL LOW (ref 4.22–5.81)
WBC: 6.1 10*3/uL (ref 4.0–10.5)

## 2013-03-09 LAB — GLUCOSE, CAPILLARY
Glucose-Capillary: 25 mg/dL — CL (ref 70–99)
Glucose-Capillary: 91 mg/dL (ref 70–99)
Glucose-Capillary: 64 mg/dL — ABNORMAL LOW (ref 70–99)
Glucose-Capillary: 127 mg/dL — ABNORMAL HIGH (ref 70–99)
Glucose-Capillary: 392 mg/dL — ABNORMAL HIGH (ref 70–99)

## 2013-03-09 LAB — HEPARIN LEVEL (UNFRACTIONATED): Heparin Unfractionated: 0.28 IU/mL — ABNORMAL LOW (ref 0.30–0.70)

## 2013-03-09 LAB — PROTIME-INR: Prothrombin Time: 18.7 seconds — ABNORMAL HIGH (ref 11.6–15.2)

## 2013-03-09 MED ORDER — INSULIN GLARGINE 100 UNIT/ML ~~LOC~~ SOLN
10.0000 [IU] | Freq: Every day | SUBCUTANEOUS | Status: DC
  Filled 2013-03-09: qty 0.1

## 2013-03-09 MED ORDER — WARFARIN SODIUM 5 MG PO TABS
5.0000 mg | ORAL_TABLET | ORAL | Status: AC
  Administered 2013-03-09: 5 mg via ORAL
  Filled 2013-03-09: qty 1

## 2013-03-09 MED ORDER — HEPARIN (PORCINE) IN NACL 100-0.45 UNIT/ML-% IJ SOLN
1750.0000 [IU]/h | INTRAMUSCULAR | Status: DC
  Administered 2013-03-09 (×2): 1550 [IU]/h via INTRAVENOUS
  Administered 2013-03-10: 1650 [IU]/h via INTRAVENOUS
  Filled 2013-03-09 (×4): qty 250

## 2013-03-09 MED ORDER — DEXTROSE 50 % IV SOLN
INTRAVENOUS | Status: AC
  Administered 2013-03-09: 50 mL
  Filled 2013-03-09: qty 50

## 2013-03-09 MED ORDER — INSULIN GLARGINE 100 UNIT/ML ~~LOC~~ SOLN
5.0000 [IU] | Freq: Every day | SUBCUTANEOUS | Status: DC
  Administered 2013-03-09: 5 [IU] via SUBCUTANEOUS
  Filled 2013-03-09 (×3): qty 0.05

## 2013-03-09 NOTE — Progress Notes (Signed)
[facility] Progress Note  Subjective:   "I'm just cold". Significant improvement  In RUE swelling.  Objective Filed Vitals:   03/09/13 0930 03/09/13 1000 03/09/13 1017 03/09/13 1057  BP: 137/73 97/40 106/55 127/67  Pulse: 53 54 54 53  Temp:   97.6 F (36.4 C) 97.2 F (36.2 C)  TempSrc:   Oral Oral  Resp:   18 18  Height:      Weight:   58.2 kg (128 lb 4.9 oz)   SpO2:   100% 100%   Physical Exam General: alert, chronically ill-appearing, NAD Heart: RRR, no murmur or rub appreciated Lungs: CTA bilat, no wheezes or rhonchi Abdomen: Soft, NT, + BS Extremities: RUE edema resolving. No LE edema Dialysis Access: Rt fem TDC (RFA AVG ligated, L thigh AVG ligated)  Dialysis Orders: [facility] MWF  4:15 hrs 61kg 2K/2.25Ca Heparin 2400 R thigh cath  Hectorol none (pth 56) Epogen 6800 Venofer 50mg  per week   Assessment/Plan: 1. RUE DVT: R arm AVG ligated 11/13 due to venous HTN, Hep/Coumadin per primary. INR still subtherapeutic , up to 1.6 today 2. L thigh AVG bleed - ligated 11/17, intraop cx's negative. ? Options for permanent access Have d/w VVS, nothing to be determined at this time, VVS will reevaluate in op setting after discharge and pt stabilized  3. ESRD: MWF,  Dialyzed off sched due to vol xs and holiday schedule. Next HD Tues.  4. HTN/volume: SBPs better on clonidine, norvasc and metroprolol; Vol xs resolved. Under edw. Lower at d/c 5. Anemia - Hgb 8.7 > 7.7 s/p 2 units PRBCs. Continue Aranesp 100 q Wed. Weekly Fe on Tuesdays. Transfuse PRN  6. Metabolic bone disease - Ca and phos controlled. Phoslyra here due to swallowing difficulty. No Vit D.  7. Nutrition - Dys 3 renal diet with fluid restr, multivitamin 8. Goiter - Dr. Gerrit Friends managing, not a surgical candidate 9. DM Type 1 - per primary; Na improving, glu 135 this am 10. Dispo - lives with his sister and her friend, need to verify; INR follow up with PCP. Rx for Phoslyra 5ml ac if still having trouble  swallowing.  [name] Thad Ranger [facility] Pager [phone] 03/09/2013,12:02 PM  LOS: 12 days   I have seen and examined patient, discussed with PA and agree with assessment and plan as outlined above with additions as indicated. [name] MD pager [phone]    cell [phone] 03/09/2013, 3:03 PM     Additional Objective Labs: Basic Metabolic Panel:  Recent Labs Lab 03/03/13 1500  03/07/13 0722 03/08/13 0728 03/09/13 0726  NA 129*  < > 129* 133* 132*  K 5.0  < > 4.6 4.7 3.8  CL 92*  < > 93* 95* 94*  CO2 24  < > 24 25 27   GLUCOSE 169*  < > 272* 102* 135*  BUN 42*  < > 17 27* 18  CREATININE 6.85*  < > 3.94* 5.62* 4.11*  CALCIUM 8.6  < > 9.0 8.8 9.1  PHOS 3.9  --   --  4.5 4.1  < > = values in this interval not displayed. Liver Function Tests:  Recent Labs Lab 03/03/13 1500 03/08/13 0728 03/09/13 0726  ALBUMIN 2.6* 2.8* 3.0*   CBC:  Recent Labs Lab 03/04/13 0822 03/05/13 0915 03/06/13 0710 03/07/13 0722 03/08/13 0441 03/09/13 0500  WBC 5.0 5.3 4.7 4.2 6.0 6.1  NEUTROABS 3.0  --   --   --   --   --  HGB 8.2* 7.9* 8.0* 8.5* 7.7* 8.7*  HCT 24.9* 23.8* 24.2* 25.3* 23.9* 26.9*  MCV 92.9 91.5 92.0 92.0 94.1 95.4  PLT 168 166 161 174 195 244   Blood Culture    Component Value Date/Time   SDES WOUND LEFT THIGH GRAFT 03/03/2013 0246   SDES WOUND LEFT THIGH GRAFT 03/03/2013 0246   SPECREQUEST NONE PT ON VANCOMYCIN 03/03/2013 0246   SPECREQUEST PT ON VANCOMYCIN 03/03/2013 0246   CULT  Value: NO GROWTH 2 DAYS Performed at [facility] 03/03/2013 0246   CULT  Value: NO ANAEROBES ISOLATED Performed at [facility] 03/03/2013 0246   REPTSTATUS 03/05/2013 FINAL 03/03/2013 0246   REPTSTATUS 03/08/2013 FINAL 03/03/2013 0246    CBG:  Recent Labs Lab 03/08/13 1646 03/08/13 2109 03/09/13 0624 03/09/13 0643 03/09/13 1055  GLUCAP 310* 128* 25* 91 64*    Studies/Results: No results found. Medications: . heparin 1,650  Units/hr (03/08/13 1800)   . amLODipine  10 mg Oral QHS  . aspirin EC  81 mg Oral Daily  . calcium acetate (Phos Binder)  667 mg Oral TID WC  . cloNIDine  0.2 mg Oral TID  . darbepoetin (ARANESP) injection - DIALYSIS  100 mcg Intravenous Q Wed-HD  . docusate sodium  100 mg Oral BID  . feeding supplement (RESOURCE BREEZE)  1 Container Oral BID BM  . ferric gluconate (FERRLECIT/NULECIT) IV  62.5 mg Intravenous Q Wed-HD  . FLUoxetine  20 mg Oral Daily  . insulin aspart  0-15 Units Subcutaneous TID WC  . insulin glargine  5 Units Subcutaneous QHS  . levothyroxine  112 mcg Oral QAC breakfast  . metoprolol  75 mg Oral BID  . multivitamin  1 tablet Oral QHS  . pantoprazole  40 mg Oral Daily  . polyethylene glycol  17 g Oral BID  . simvastatin  5 mg Oral q1800  . sodium chloride  3 mL Intravenous Q12H  . temazepam  15 mg Oral Custom  . Warfarin - Pharmacist Dosing Inpatient   Does not apply [phone]

## 2013-03-09 NOTE — Progress Notes (Signed)
[facility] PROGRESS NOTE  Marcus [name] ZOX:[id number] DOB: [birth year] DOA: 02/25/2013 PCP: Galvin Proffer, MD  Brief narrative 57 y.o. male with h/o ESRD (HD MWF) who presents to [facility] with worsening R arm swelling over the past week. His current HD access is in his L thigh and unrelated to this. Patient had a recent  attempt at thyroidectomy performed last month by Dr. Gerrit Friends. 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   In the ED at [facility] he was found to have not only RUE DVT but thrombus in his R IJ as well. The enlargement of the thyroid appears to be extending down into his thoracic cavity (unchanged since prior CT). 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  [facility].  Patient was seen by vascular surgery and underwent ligation of right forearm AV graft. Post procedure he was tolerating this well and started on Coumadin when on the evening of 11/16, patient started having significant bleeding from the left thigh AV graft. Patient was taken emergently to the OR where he underwent ligation of this graft. In addition and underwent right femoral vein cannulation and tunneled dialysis catheter was placed. He was monitored in the surgical ICU on 11/17-18 and then transferred back to nephrology floor.    Assessment/Plan:  Rt IJ thrombus/RUE DVT -was on heparin gtt, which was then on hold due to bleeding fistula,  -INR therapeutic 11/18 then trended down and hence coumadin being bridged with heparin1 -INR still subtherapeutic, pharmacy dosing -Possibly from direct compression of IJ by enlarged thyroid.   -per [facility]VS ok to resume anticoagulation -Appreciate consultation by general surgery, vascular surgery and interventional radiology.  -s/p  Ligation of right AV graft done last week  Left thigh graft bleed:  -resolved -Greatly appreciate vascular surgery and critical care help.  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 Per Dr.Gerkin -given recent complications, not felt to be a surgical candidate per [facility] at this time -FU with endocrinology regarding options namely radioactive iodine/XRT, will d/w Endocrine -TSH significantly elevated, but T4 slightly up, check T3 -CT reports unchanged airway compression. patient in no resp distress. does c/o some new dysphasia, seen by ST has seen and put on dysphagia 3 diet  ESRD on HD ( M,W,F)  renal following .blood from left thigh graft. See above. HD cath R leg now  Type 1 DM  -CBG trending up, restart lantus, SSI  HTN  continue home meds   Hx of CVA  continue ASA, statin   GERD  continue PPI   DIET: dys level 3   DVT prophylaxis: on anticoagulation.  Ambulate/PT/OT  Code Status: full code  Family Communication:none at bedside Disposition Plan: home vs ST rehab   Consultants:  [facility]  vascular surgery ( Dr Darrick Penna)  IR Critical care   Procedures:  For right IJ thrombus ligation done 11/13 Ligation of left AV graft secondary to bleed done 11/17. Placement of tunneled catheter on right leg 11/17  Antibiotics:  None  HPI/Subjective:  Denies any complaints today   Objective: Filed Vitals:   03/09/13 1057  BP: 127/67  Pulse: 53  Temp: 97.2 F (36.2  C)  Resp: 18    Intake/Output Summary (Last 24 hours) at 03/09/13 1202 Last data filed at 03/09/13 1017  Gross per 24 hour  Intake 995.38 ml  Output   2500 ml  Net -1504.62 ml   Filed Weights   03/08/13 2039 03/09/13 0655 03/09/13 1017  Weight: 60.1 kg (132 lb 7.9 oz) 60.9 kg (134 lb 4.2 oz) 58.2 kg (128 lb 4.9 oz)    Exam: General: alert and oriented x2, no acute distress HEENT: no  pallor, moist mucosa, enlarged thyroid  Chest:lungs clear auscultation bilaterally  CVS: Regular rate and rhythm, S1-S2 Abd: Soft, nontender, nondistended, positive bowel sounds  Ext: swollen rt arm and forearm , tunneled catheter-right leg   Data Reviewed: Basic Metabolic Panel:  Recent Labs Lab 03/03/13 1500  03/05/13 0915 03/06/13 0644 03/07/13 0722 03/08/13 0728 03/09/13 0726  NA 129*  < > 135 128* 129* 133* 132*  K 5.0  < > <2.0* 4.8 4.6 4.7 3.8  CL 92*  < > 94* 91* 93* 95* 94*  CO2 24  < > 31 22 24 25 27   GLUCOSE 169*  < > 129* 374* 272* 102* 135*  BUN 42*  < > <3* 24* 17 27* 18  CREATININE 6.85*  < > 0.76 4.36* 3.94* 5.62* 4.11*  CALCIUM 8.6  < > 8.2* 8.6 9.0 8.8 9.1  PHOS 3.9  --   --   --   --  4.5 4.1  < > = values in this interval not displayed. Liver Function Tests:  Recent Labs Lab 03/03/13 1500 03/08/13 0728 03/09/13 0726  ALBUMIN 2.6* 2.8* 3.0*   No results found for this basename: LIPASE, AMYLASE,  in the last 168 hours No results found for this basename: AMMONIA,  in the last 168 hours CBC:  Recent Labs Lab 03/04/13 0822 03/05/13 0915 03/06/13 0710 03/07/13 0722 03/08/13 0441 03/09/13 0500  WBC 5.0 5.3 4.7 4.2 6.0 6.1  NEUTROABS 3.0  --   --   --   --   --   HGB 8.2* 7.9* 8.0* 8.5* 7.7* 8.7*  HCT 24.9* 23.8* 24.2* 25.3* 23.9* 26.9*  MCV 92.9 91.5 92.0 92.0 94.1 95.4  PLT 168 166 161 174 195 244   Cardiac Enzymes: No results found for this basename: CKTOTAL, CKMB, CKMBINDEX, TROPONINI,  in the last 168 hours BNP (last 3 results) No results found for this basename: PROBNP,  in the last 8760 hours CBG:  Recent Labs Lab 03/08/13 1646 03/08/13 2109 03/09/13 0624 03/09/13 0643 03/09/13 1055  GLUCAP 310* 128* 25* 91 64*    Recent Results (from the past 240 hour(s))  WOUND CULTURE     Status: None   Collection Time    03/03/13  2:46 AM      Result Value Range Status   Specimen Description WOUND LEFT THIGH GRAFT   Final   Special  Requests NONE PT ON VANCOMYCIN   Final   Gram Stain     Final   Value: NO WBC SEEN     NO SQUAMOUS EPITHELIAL CELLS SEEN     NO ORGANISMS SEEN     Performed at [facility]   Culture     Final   Value: NO GROWTH 2 DAYS     Performed at [facility]   Report Status 03/05/2013 FINAL   Final  ANAEROBIC CULTURE     Status: None   Collection Time    03/03/13  2:46 AM  Result Value Range Status   Specimen Description WOUND LEFT THIGH GRAFT   Final   Special Requests PT ON VANCOMYCIN   Final   Gram Stain     Final   Value: NO WBC SEEN     NO SQUAMOUS EPITHELIAL CELLS SEEN     NO ORGANISMS SEEN     Performed at [facility]   Culture     Final   Value: NO ANAEROBES ISOLATED     Performed at [facility]   Report Status 03/08/2013 FINAL   Final     Studies: No results found.  Scheduled Meds: . amLODipine  10 mg Oral QHS  . aspirin EC  81 mg Oral Daily  . calcium acetate (Phos Binder)  667 mg Oral TID WC  . cloNIDine  0.2 mg Oral TID  . darbepoetin (ARANESP) injection - DIALYSIS  100 mcg Intravenous Q Wed-HD  . docusate sodium  100 mg Oral BID  . feeding supplement (RESOURCE BREEZE)  1 Container Oral BID BM  . ferric gluconate (FERRLECIT/NULECIT) IV  62.5 mg Intravenous Q Wed-HD  . FLUoxetine  20 mg Oral Daily  . insulin aspart  0-15 Units Subcutaneous TID WC  . insulin glargine  5 Units Subcutaneous QHS  . levothyroxine  112 mcg Oral QAC breakfast  . metoprolol  75 mg Oral BID  . multivitamin  1 tablet Oral QHS  . pantoprazole  40 mg Oral Daily  . polyethylene glycol  17 g Oral BID  . simvastatin  5 mg Oral q1800  . sodium chloride  3 mL Intravenous Q12H  . temazepam  15 mg Oral Custom  . Warfarin - Pharmacist Dosing Inpatient   Does not apply q1800   Continuous Infusions: . heparin 1,650 Units/hr (03/08/13 1800)      Time spent: 20 minutes    Artice Holohan  [facility] Pager [phone] If 7PM-7AM, please contact  night-coverage at [email or web address], password [facility] 03/09/2013, 12:02 PM  LOS: 12 days

## 2013-03-09 NOTE — Progress Notes (Signed)
ANTICOAGULATION CONSULT NOTE - Follow Up Consult  Pharmacy Consult for coumadin and heparin  Indication: RUE DVT and RIJ thrombus  Allergies  Allergen Reactions  . Penicillins Other (See Comments)    Unknown reaction    Patient Measurements: Height: 6\' 2"  (188 cm) Weight: 128 lb 4.9 oz (58.2 kg) IBW/kg (Calculated) : 82.2 Heparin Dosing Weight: 58.2 kg  Vital Signs: Temp: 98.4 F (36.9 C) (11/23 1322) Temp src: Oral (11/23 1322) BP: 110/55 mmHg (11/23 1322) Pulse Rate: 55 (11/23 1322)  Labs:  Recent Labs  03/07/13 0722  03/08/13 0441 03/08/13 0728 03/08/13 1220 03/09/13 0023 03/09/13 0500 03/09/13 0726 03/09/13 1250  HGB 8.5*  --  7.7*  --   --   --  8.7*  --   --   HCT 25.3*  --  23.9*  --   --   --  26.9*  --   --   PLT 174  --  195  --   --   --  244  --   --   LABPROT 16.7*  --   --   --  17.4*  --  18.7*  --   --   INR 1.39  --   --   --  1.47  --  1.61*  --   --   HEPARINUNFRC 0.25*  < >  --   --  0.66 0.28*  --   --  1.03*  CREATININE 3.94*  --   --  5.62*  --   --   --  4.11*  --   < > = values in this interval not displayed.  Estimated Creatinine Clearance: 16.3 ml/min (by C-G formula based on Cr of 4.11).   Medications:  Scheduled:  . amLODipine  10 mg Oral QHS  . aspirin EC  81 mg Oral Daily  . calcium acetate (Phos Binder)  667 mg Oral TID WC  . cloNIDine  0.2 mg Oral TID  . darbepoetin (ARANESP) injection - DIALYSIS  100 mcg Intravenous Q Wed-HD  . docusate sodium  100 mg Oral BID  . feeding supplement (RESOURCE BREEZE)  1 Container Oral BID BM  . ferric gluconate (FERRLECIT/NULECIT) IV  62.5 mg Intravenous Q Wed-HD  . FLUoxetine  20 mg Oral Daily  . insulin aspart  0-15 Units Subcutaneous TID WC  . insulin glargine  5 Units Subcutaneous QHS  . levothyroxine  112 mcg Oral QAC breakfast  . metoprolol  75 mg Oral BID  . multivitamin  1 tablet Oral QHS  . pantoprazole  40 mg Oral Daily  . polyethylene glycol  17 g Oral BID  . simvastatin  5  mg Oral q1800  . sodium chloride  3 mL Intravenous Q12H  . temazepam  15 mg Oral Custom  . Warfarin - Pharmacist Dosing Inpatient   Does not apply q1800   Infusions:  . heparin 1,650 Units/hr (03/08/13 1800)    Assessment: 57 yo male with RUE DVT and RIJ thrombus is currently on subtherapeutic coumadin bridging with supratherapeutic heparin.  Heparin level was 1.03 (confirmed with lab that it was drawn correctly) and INR up nicely to 1.61. Goal of Therapy:  INR 2-3; 8hr heparin leve: 0.3-0.5 Monitor platelets by anticoagulation protocol: Yes   Plan:  - Hold heparin x 1 hr, then reduce heparin to 1550 units/hr. will aim for 0.3-0.5 given recent bleed. 8hr heparin level - Warfarin 5 mg x 1   Cayce Quezada, Tsz-Yin 03/09/2013,1:39 PM

## 2013-03-09 NOTE — Progress Notes (Signed)
ANTICOAGULATION CONSULT NOTE - Follow Up Consult  Pharmacy Consult for heparin Indication: DVT and IJ thrombus  Labs:  Recent Labs  03/06/13 0644  03/06/13 0710  03/07/13 0722 03/07/13 1835 03/08/13 0441 03/08/13 0728 03/08/13 1220 03/09/13 0023  HGB  --   < > 8.0*  --  8.5*  --  7.7*  --   --   --   HCT  --   --  24.2*  --  25.3*  --  23.9*  --   --   --   PLT  --   --  161  --  174  --  195  --   --   --   LABPROT  --   --  18.5*  --  16.7*  --   --   --  17.4*  --   INR  --   --  1.59*  --  1.39  --   --   --  1.47  --   HEPARINUNFRC  --   --   --   < > 0.25* 0.25*  --   --  0.66 0.28*  CREATININE 4.36*  --   --   --  3.94*  --   --  5.62*  --   --   < > = values in this interval not displayed.   Assessment: 57yo male now slightly subtherapeutic on heparin after rate decrease for high level.  Goal of Therapy:  Heparin level 0.3-0.5 units/ml   Plan:  Will increase heparin gtt slightly to 1700 units/hr and check level in 8hr.  Vernard Gambles, PharmD, BCPS  03/09/2013,1:52 AM

## 2013-03-10 LAB — GLUCOSE, CAPILLARY
Glucose-Capillary: 81 mg/dL (ref 70–99)
Glucose-Capillary: 90 mg/dL (ref 70–99)
Glucose-Capillary: 117 mg/dL — ABNORMAL HIGH (ref 70–99)
Glucose-Capillary: 206 mg/dL — ABNORMAL HIGH (ref 70–99)

## 2013-03-10 LAB — HEPARIN LEVEL (UNFRACTIONATED)
Heparin Unfractionated: 0.29 IU/mL — ABNORMAL LOW (ref 0.30–0.70)
Heparin Unfractionated: 0.27 IU/mL — ABNORMAL LOW (ref 0.30–0.70)

## 2013-03-10 LAB — CBC
Hemoglobin: 8.6 g/dL — ABNORMAL LOW (ref 13.0–17.0)
RBC: 2.82 MIL/uL — ABNORMAL LOW (ref 4.22–5.81)
WBC: 7.1 10*3/uL (ref 4.0–10.5)

## 2013-03-10 LAB — PROTIME-INR
INR: 1.62 — ABNORMAL HIGH (ref 0.00–1.49)
Prothrombin Time: 18.8 seconds — ABNORMAL HIGH (ref 11.6–15.2)

## 2013-03-10 MED ORDER — SODIUM CHLORIDE 0.9 % IV SOLN: 100.0000 mL | INTRAVENOUS | Status: DC | PRN

## 2013-03-10 MED ORDER — LIDOCAINE HCL (PF) 1 % IJ SOLN: 5.0000 mL | INTRAMUSCULAR | Status: DC | PRN

## 2013-03-10 MED ORDER — DEXTROSE 50 % IV SOLN
INTRAVENOUS | Status: AC
  Administered 2013-03-10: 30 mL
  Filled 2013-03-10: qty 50

## 2013-03-10 MED ORDER — HEPARIN SODIUM (PORCINE) 1000 UNIT/ML DIALYSIS
1000.0000 [IU] | INTRAMUSCULAR | Status: DC | PRN
  Filled 2013-03-10: qty 1

## 2013-03-10 MED ORDER — WARFARIN SODIUM 7.5 MG PO TABS
7.5000 mg | ORAL_TABLET | ORAL | Status: AC
  Administered 2013-03-10: 7.5 mg via ORAL
  Filled 2013-03-10: qty 1

## 2013-03-10 MED ORDER — NEPRO/CARBSTEADY PO LIQD: 237.0000 mL | ORAL | Status: DC | PRN

## 2013-03-10 MED ORDER — HEPARIN SODIUM (PORCINE) 1000 UNIT/ML DIALYSIS
20.0000 [IU]/kg | INTRAMUSCULAR | Status: DC | PRN
  Filled 2013-03-10: qty 2

## 2013-03-10 MED ORDER — PENTAFLUOROPROP-TETRAFLUOROETH EX AERO: 1.0000 "application " | INHALATION_SPRAY | CUTANEOUS | Status: DC | PRN

## 2013-03-10 MED ORDER — LIDOCAINE-PRILOCAINE 2.5-2.5 % EX CREA: 1.0000 "application " | TOPICAL_CREAM | CUTANEOUS | Status: DC | PRN

## 2013-03-10 MED ORDER — ALTEPLASE 2 MG IJ SOLR
2.0000 mg | Freq: Once | INTRAMUSCULAR | Status: AC | PRN
  Filled 2013-03-10: qty 2

## 2013-03-10 NOTE — Progress Notes (Signed)
ANTICOAGULATION CONSULT NOTE - Follow Up Consult  Pharmacy Consult for heparin Indication: DVT and IJ thrombus  Labs:  Recent Labs  03/07/13 0722  03/08/13 0441 03/08/13 0728 03/08/13 1220 03/09/13 0023 03/09/13 0500 03/09/13 0726 03/09/13 1250 03/09/13 2315  HGB 8.5*  --  7.7*  --   --   --  8.7*  --   --   --   HCT 25.3*  --  23.9*  --   --   --  26.9*  --   --   --   PLT 174  --  195  --   --   --  244  --   --   --   LABPROT 16.7*  --   --   --  17.4*  --  18.7*  --   --   --   INR 1.39  --   --   --  1.47  --  1.61*  --   --   --   HEPARINUNFRC 0.25*  < >  --   --  0.66 0.28*  --   --  1.03* 0.29*  CREATININE 3.94*  --   --  5.62*  --   --   --  4.11*  --   --   < > = values in this interval not displayed.   Assessment/Plan: 57yo male now slightly subtherapeutic on heparin after rate decrease for high level.  However pt has low goal level and minor dose adjustents have resulted in large changes in level.  Will continue gtt at current rate for now and check level with am labs.  Vernard Gambles, PharmD, BCPS  03/10/2013,12:18 AM

## 2013-03-10 NOTE — Progress Notes (Signed)
Patient ID: Marcus [name], male   DOB: 03/07/1956, 57 y.o.   MRN: [id number] Very complex vascular access patient with end-stage renal disease. This is my first evaluation of the patient. We have been asked by Dr. Powell to see him again prior to discharge. Patient had been plan to see Dr. Dickson in our office for future access placement planning on December 17. In reviewing his records he did have increased swelling in his right arm with known right innominate vein occlusion. He had a nonfunctional but patent right arm AV graft. This was ligated by Dr. Dickson: 02/28/2013. He did have a functional left femoral loop graft. Apparently on 11/17 he had bleeding from this and apparently had this ligated. I am unable to locate the operative note regarding this case. There was also placement of a right femoral dialysis catheter at the same setting. He has been dialyzed via his right femoral catheter since that time.  On physical exam his right arm does have marked swelling but apparently this is improved. Does have multiple prior left arm graft and the patient reports this was his initial graft. He does have a left femoral loop graft with no evidence of erythema with nicely healing incisions. He has a right femoral dialysis catheter.  I reviewed his prior x-rays. I do not see any evidence that he has anything that would prevent him from having a new left femoral loop graft around his old graft. He does have easily palpable left popliteal pulse but there is no arterial reason not to do this. I would recommend placement of a new left femoral loop graft as there is no evidence of infection in the old ligated the graft. We would recommend this on a nondialysis day. Diffuse up to be medically stable enough for this we could proceed with this while he is an inpatient. 

## 2013-03-10 NOTE — Progress Notes (Addendum)
Inpatient Diabetes Program Recommendations  AACE/ADA: New Consensus Statement on Inpatient Glycemic Control (2013)  Target Ranges:  Prepandial:   less than 140 mg/dL      Peak postprandial:   less than 180 mg/dL (1-2 hours)      Critically ill patients:  140 - 180 mg/dL     Results for KANI, CHAUVIN (MRN [id number]) as of 03/10/2013 13:59  Ref. Range 03/08/2013 11:59 03/08/2013 13:15 03/08/2013 16:46 03/08/2013 21:09  Glucose-Capillary Latest Range: 70-99 mg/dL 54 (L) 829 (H) 562 (H) 128 (H)    Results for YONATHAN, PERROW (MRN [id number]) as of 03/10/2013 13:59  Ref. Range 03/09/2013 06:24 03/09/2013 06:43 03/09/2013 10:55 03/09/2013 11:33 03/09/2013 12:02 03/09/2013 16:33 03/09/2013 22:00  Glucose-Capillary Latest Range: 70-99 mg/dL 25 (LL) 91 64 (L) 57 (L) 127 (H) 392 (H) 81    Results for SRIHITH, AQUILINO (MRN [id number]) as of 03/10/2013 13:59  Ref. Range 03/10/2013 03:38 03/10/2013 04:00 03/10/2013 07:43 03/10/2013 11:28  Glucose-Capillary Latest Range: 70-99 mg/dL 17 (LL) 90 132 (H) 440 (H)    **Patient with extremely labile blood glucose levels.  Has +ESRD which is likely contributing to such labile control.  Patient can be very sensitive to insulin and does not fare well with extremely large doses of Novolog or Lantus.  **Patient had CBG of 310 mg/dl on evening of 10/27.  Got 11 units of Novolog and then dropped to 25 mg/dl the very next morning (11/23).    **Patient then had a CBG of 392 mg/dl yesterday evening (25/36) and received 15 units of Novolog.  As a result, he dropped to 17 mg/dl this morning (64/40).   **MD- Please consider prescribing a custom very sensitive SSI regimen for patient so that he does not receive huge doses of Novolog that could potentially cause severe hypoglycemia.  Recommend the following SSI:  121-150 mg/dl- 1 unit 347-425 mg/dl- 2 units 956-387 mg/dl- 3 units 564-332 mg/dl- 4 units 951-884 mg/dl- 5 units 166-063 mg/dl- 6 units   Will  follow. Ambrose Finland RN, MSN, CDE Diabetes Coordinator Inpatient Diabetes Program Team Pager: [phone] (8a-10p)

## 2013-03-10 NOTE — Evaluation (Signed)
Occupational Therapy Evaluation Patient Details Name: Marcus [name] MRN: [id number] DOB: 1955-05-02 Today's Date: 03/10/2013 Time: 7829-5621 OT Time Calculation (min): 18 min  OT Assessment / Plan / Recommendation History of present illness Pt is a 57 y.o adm with PMH of end stage renal disease. He was being anticoagulated for his DVT when he developed acute bleeding from his dialysis graft in his Lt thigh. Pt with RIJ thrombosis as well as collapse of Rt innominate stent. Pt now has new Rt femoral vein cannulation which he received on 11/17.    Clinical Impression   Pt demos decline in function and safety with ADLs and ADL mobility safety and would benefit from acute OT services to address impairments to help restore PLOF to return home safly    OT Assessment  Patient needs continued OT Services    Follow Up Recommendations  Home health OT;Supervision/Assistance - 24 hour    Barriers to Discharge   none  Equipment Recommendations  None recommended by OT    Recommendations for Other Services    Frequency  Min 2X/week    Precautions / Restrictions Precautions Precautions: Fall Restrictions Weight Bearing Restrictions: No   Pertinent Vitals/Pain 7/10 R UE    ADL  Grooming: Performed;Wash/dry hands;Wash/dry face;Min guard;Minimal assistance Where Assessed - Grooming: Supported standing Upper Body Bathing: Simulated;Supervision/safety;Set up Where Assessed - Upper Body Bathing: Unsupported sitting Lower Body Bathing: Minimal assistance;Simulated Where Assessed - Lower Body Bathing: Unsupported sitting;Supported sit to stand Upper Body Dressing: Performed;Supervision/safety;Set up Lower Body Dressing: Performed;Minimal assistance Where Assessed - Lower Body Dressing: Unsupported sitting;Supported sit to stand Toilet Transfer: Performed;Min guard Toilet Transfer Method: Sit to Barista: Regular height toilet;Grab bars Toileting - Architect  and Hygiene: Min guard Where Assessed - Engineer, mining and Hygiene: Standing Tub/Shower Transfer Method: Not assessed Equipment Used: Gait belt;Rolling walker Transfers/Ambulation Related to ADLs: min guard to steady and maintain balance; cues for hand placement and management of RW    OT Diagnosis: Generalized weakness  OT Problem List: Decreased activity tolerance;Impaired balance (sitting and/or standing);Decreased safety awareness;Increased edema;Decreased strength OT Treatment Interventions: Self-care/ADL training;Balance training;Therapeutic exercise;Neuromuscular education;Therapeutic activities;DME and/or AE instruction;Patient/family education   OT Goals(Current goals can be found in the care plan section) Acute Rehab OT Goals Patient Stated Goal: to go home soon OT Goal Formulation: With patient Time For Goal Achievement: 03/17/13 Potential to Achieve Goals: Good ADL Goals Pt Will Perform Grooming: with min guard assist;with supervision;with set-up;standing Pt Will Perform Lower Body Bathing: with min guard assist;with supervision;with set-up;sit to/from stand Pt Will Perform Lower Body Dressing: with min guard assist;with supervision;with set-up;sit to/from stand Pt Will Transfer to Toilet: with supervision;with modified independence;ambulating;regular height toilet;grab bars Pt Will Perform Toileting - Clothing Manipulation and hygiene: with supervision;with modified independence;sit to/from stand Additional ADL Goal #1: Pt will verbalize and demo correct elevated positioning of R UE when seated and in bed for edema mgt  Visit Information  Last OT Received On: 03/10/13 Assistance Needed: +1 History of Present Illness: Pt is a 56 y.o adm with PMH of end stage renal disease. He was being anticoagulated for his DVT when he developed acute bleeding from his dialysis graft in his Lt thigh. Pt with RIJ thrombosis as well as collapse of Rt innominate stent. Pt now has  new Rt femoral vein cannulation which he received on 11/17.        Prior Functioning     Home Living Family/patient expects to be discharged to:: Private residence Living  Arrangements: Other relatives Available Help at Discharge: Family;Friend(s);Available PRN/intermittently Type of Home: Apartment Home Access: Stairs to enter Entrance Stairs-Number of Steps: 4 Entrance Stairs-Rails: Right Home Layout: One level Home Equipment: Walker - 4 wheels;Shower seat Prior Function Level of Independence: Independent with assistive device(s) Communication Communication: No difficulties Dominant Hand: Right         Vision/Perception Vision - History Baseline Vision: Other (comment) (says he needs glasses, but does not have) Patient Visual Report: No change from baseline Perception Perception: Within Functional Limits   Cognition  Cognition Arousal/Alertness: Awake/alert Behavior During Therapy: Flat affect Overall Cognitive Status: No family/caregiver present to determine baseline cognitive functioning Area of Impairment: Following commands;Safety/judgement Following Commands: Follows one step commands consistently;Follows one step commands with increased time    Extremity/Trunk Assessment Upper Extremity Assessment Upper Extremity Assessment: RUE deficits/detail RUE Deficits / Details: edema R UE, weakness 3/5 grossly Lower Extremity Assessment Lower Extremity Assessment: Defer to PT evaluation Cervical / Trunk Assessment Cervical / Trunk Assessment: Normal     Mobility Bed Mobility Bed Mobility: Supine to Sit;Sitting - Scoot to Edge of Bed Supine to Sit: 6: Modified independent (Device/Increase time);With rails Sitting - Scoot to Edge of Bed: 7: Independent Sit to Supine: 5: Supervision Scooting to [facility]: 5: Supervision Details for Bed Mobility Assistance: pt relies on hand rails; no physical (A) needed Transfers Sit to Stand: 4: Min guard;From bed;With upper extremity  assist;From toilet Stand to Sit: 4: Min guard;With armrests;With upper extremity assist;To bed;To toilet Details for Transfer Assistance: min guard to steady and maintain balance; cues for hand placement and management of RW     Exercise Other Exercises Other Exercises: Educated pt on elevated positioning of R UE for edema control. Pt's R UE not elevated upon entering room. Pt's R UE positioned in elevation with pillow at end of session   Balance Balance Balance Assessed: Yes Dynamic Sitting Balance Dynamic Sitting - Balance Support: No upper extremity supported;Feet supported Dynamic Sitting - Level of Assistance: 6: Modified independent (Device/Increase time) Static Standing Balance Static Standing - Balance Support: Bilateral upper extremity supported;During functional activity Static Standing - Level of Assistance: Other (comment) (min guard A) Dynamic Standing Balance Dynamic Standing - Balance Support: Left upper extremity supported;During functional activity Dynamic Standing - Level of Assistance: 4: Min assist   End of Session OT - End of Session Equipment Utilized During Treatment: Gait belt;Rolling walker Activity Tolerance: Patient tolerated treatment well Patient left: in bed;with call bell/phone within reach;with bed alarm set  GO     Galen Manila 03/10/2013, 2:50 PM

## 2013-03-10 NOTE — Progress Notes (Signed)
Physical Therapy Treatment Patient Details Name: Marcus [name] MRN: [id number] DOB: Jan 26, 1956 Today's Date: 03/10/2013 Time: 7829-5621 PT Time Calculation (min): 17 min  PT Assessment / Plan / Recommendation  History of Present Illness Pt is a 57 y.o adm with PMH of end stage renal disease. He was being anticoagulated for his DVT when he developed acute bleeding from his dialysis graft in his Lt thigh. Pt with RIJ thrombosis as well as collapse of Rt innominate stent. Pt now has new Rt femoral vein cannulation which he received on 11/17. Pt has been up to chair per nursing and is safe to transfer to chair with therapy.    PT Comments   Pt able to progress mobility today. Continues to require min guard to min (A) to steady and maintain balance with gt. Pt is a fall risk and demo decreased safety awareness with gt. Pt continues to insist he is going home with family and states he has 24/7 (A). Will continue to follow per POC.   Follow Up Recommendations  Home health PT;Supervision/Assistance - 24 hour;Other (comment)     Does the patient have the potential to tolerate intense rehabilitation     Barriers to Discharge        Equipment Recommendations  None recommended by PT    Recommendations for Other Services OT consult  Frequency Min 3X/week   Progress towards PT Goals Progress towards PT goals: Progressing toward goals  Plan Current plan remains appropriate    Precautions / Restrictions Precautions Precautions: Fall Restrictions Weight Bearing Restrictions: No   Pertinent Vitals/Pain 4/10 in Rt UE     Mobility  Bed Mobility Bed Mobility: Supine to Sit;Sitting - Scoot to Edge of Bed Supine to Sit: 6: Modified independent (Device/Increase time);With rails Sitting - Scoot to Edge of Bed: 7: Independent Sit to Supine: 5: Supervision Scooting to [facility]: 5: Supervision Details for Bed Mobility Assistance: pt relies on hand rails; no physical (A) needed Transfers Transfers: Sit  to Stand;Stand to Sit Sit to Stand: 4: Min guard;From bed;From chair/3-in-1;With upper extremity assist Stand to Sit: 4: Min guard;To chair/3-in-1;With armrests;With upper extremity assist Details for Transfer Assistance: min guard to steady and maintain balance; cues for hand placement and management of RW Ambulation/Gait Ambulation/Gait Assistance: 4: Min guard Ambulation Distance (Feet): 120 Feet Assistive device: Rolling walker Ambulation/Gait Assistance Details: pt unsteady with gt; amb with steppage gt and decr coordination; cues throughout to keep RW on ground especially with turns, pt tends to lift RW and become off balance Gait Pattern: Wide base of support Gait velocity: unsafe to increased; decreased gt due to balance deficits  General Gait Details: pt required single sitting break  Stairs: No Wheelchair Mobility Wheelchair Mobility: No         PT Diagnosis:    PT Problem List:   PT Treatment Interventions:     PT Goals (current goals can now be found in the care plan section) Acute Rehab PT Goals Patient Stated Goal: to go home soon PT Goal Formulation: With patient Time For Goal Achievement: 03/21/13 Potential to Achieve Goals: Good  Visit Information  Last PT Received On: 03/10/13 Assistance Needed: +2 (for stairs) History of Present Illness: Pt is a 57 y.o adm     Subjective Data  Subjective: pt lying supine; agreeable to walk.  Patient Stated Goal: to go home soon   Cognition  Cognition Arousal/Alertness: Awake/alert Behavior During Therapy: Flat affect Overall Cognitive Status: No family/caregiver present to determine baseline cognitive functioning Area  of Impairment: Following commands;Safety/judgement Following Commands: Follows one step commands consistently;Follows one step commands with increased time    Balance  Balance Balance Assessed: Yes Static Standing Balance Static Standing - Balance Support: Bilateral upper extremity supported;During  functional activity Static Standing - Level of Assistance: Other (comment) (min guard)  End of Session PT - End of Session Equipment Utilized During Treatment: Gait belt Activity Tolerance: Patient tolerated treatment well Patient left: in chair;with call bell/phone within reach Nurse Communication: Mobility status   GP     Donell Sievert, [place] [phone] 03/10/2013, 1:34 PM

## 2013-03-10 NOTE — Progress Notes (Signed)
ANTICOAGULATION CONSULT NOTE - Follow Up Consult  Pharmacy Consult for heparin Indication: DVT and IJ thrombus  Labs:  Recent Labs  03/08/13 0441 03/08/13 0728 03/08/13 1220  03/09/13 0500 03/09/13 0726  03/09/13 2315 03/10/13 0604 03/10/13 2000  HGB 7.7*  --   --   --  8.7*  --   --   --  8.6*  --   HCT 23.9*  --   --   --  26.9*  --   --   --  26.9*  --   PLT 195  --   --   --  244  --   --   --  277  --   LABPROT  --   --  17.4*  --  18.7*  --   --   --  18.8*  --   INR  --   --  1.47  --  1.61*  --   --   --  1.62*  --   HEPARINUNFRC  --   --  0.66  < >  --   --   < > 0.29* 0.27* <0.10*  CREATININE  --  5.62*  --   --   --  4.11*  --   --   --   --   < > = values in this interval not displayed.   Assessment: 57yo male now undetectable on heparin despite rate increase for low level.  Goal of Therapy:  Heparin level 0.3-0.5 units/ml   Plan:  Will increase heparin gtt slightly to 1750 units/hr and check level in 8hr.  Vernard Gambles, PharmD, BCPS  03/10/2013,10:35 PM

## 2013-03-10 NOTE — Progress Notes (Addendum)
[facility] PROGRESS NOTE  Marcus [name] ZOX:[id number] DOB: 05-01-1955 DOA: 02/25/2013 PCP: Galvin Proffer, MD  Brief narrative 57 y.o. male with h/o ESRD (HD MWF) who presents to [place] [facility] with worsening R arm swelling over the past week. His current HD access is in his L thigh and unrelated to this. Patient had a recent  attempt at thyroidectomy performed last month by Dr. Gerrit Friends. 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   In the ED at [facility] he was found to have not only RUE DVT but thrombus in his R IJ as well. The enlargement of the thyroid appears to be extending down into his thoracic cavity (unchanged since prior CT). 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  [facility].  Patient was seen by vascular surgery and underwent ligation of right forearm AV graft. Post procedure he was tolerating this well and started on Coumadin when on the evening of 11/16, patient started having significant bleeding from the left thigh AV graft. Patient was taken emergently to the OR where he underwent ligation of this graft. In addition and underwent right femoral vein cannulation and tunneled dialysis catheter was placed. He was monitored in the surgical ICU on 11/17-18 and then transferred back to nephrology floor.    Assessment/Plan:  Rt IJ thrombus/RUE DVT -was on heparin gtt, which was then on hold due to bleeding fistula,  -INR therapeutic 11/18 then trended down and hence coumadin being bridged with heparin1 -INR still subtherapeutic, pharmacy dosing to increase coumadin dose today -Appreciate consultation by general surgery, vascular surgery and interventional radiology.  -s/p Ligation of right AV graft done last week  Left thigh graft bleed:   -resolved -Greatly appreciate vascular surgery and critical care help.  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 Per Dr.Gerkin -given recent complications, not felt to be a surgical candidate per [facility] at this time -FU with endocrinology regarding options namely radioactive iodine/XRT, will d/w Endocrine -TSH significantly elevated, but T4 only slightly elevated, check T3 -CT reports unchanged airway compression. patient in no resp distress. does c/o some new dysphasia, seen by ST has seen and put on dysphagia 3 diet  ESRD on HD ( M,W,F)  Using left thigh graft.  Renal following, HD  Type 1 DM  -CBG stable -lantus, SSI  HTN  continue home meds   Hx of CVA  continue ASA, statin   GERD  continue PPI   DIET: dys level 3   DVT prophylaxis: on anticoagulation.  Ambulate/PT/OT  Code Status: full code  Family Communication:none at bedside Disposition Plan: home with [facility] when INR therapeutic  Consultants:  [facility]  vascular surgery ( Dr Darrick Penna)  [facility] Critical care   Procedures:  For right IJ thrombus ligation done 11/13 Ligation of left AV graft secondary to bleed done 11/17. Placement of tunneled catheter on right leg 11/17  Antibiotics:  None  HPI/Subjective:  Denies any complaints today   Objective: Filed Vitals:   03/10/13 0943  BP: 178/77  Pulse: 58  Temp: 97.3 F (36.3 C)  Resp: 17    Intake/Output Summary (Last 24 hours) at 03/10/13 1526 Last data filed  at 03/10/13 1412  Gross per 24 hour  Intake    730 ml  Output      1 ml  Net    729 ml   Filed Weights   03/09/13 0655 03/09/13 1017 03/09/13 2202  Weight: 60.9 kg (134 lb 4.2 oz) 58.2 kg (128 lb 4.9 oz) 60.05 kg (132 lb 6.2 oz)    Exam: General: alert and oriented x2, no acute distress HEENT: no pallor, moist mucosa, enlarged thyroid  Chest:lungs clear auscultation bilaterally   CVS: Regular rate and rhythm, S1-S2 Abd: Soft, nontender, nondistended, positive bowel sounds  Ext: swollen rt arm and forearm , tunneled catheter-right leg   Data Reviewed: Basic Metabolic Panel:  Recent Labs Lab 03/05/13 0915 03/06/13 0644 03/07/13 0722 03/08/13 0728 03/09/13 0726  NA 135 128* 129* 133* 132*  K <2.0* 4.8 4.6 4.7 3.8  CL 94* 91* 93* 95* 94*  CO2 31 22 24 25 27   GLUCOSE 129* 374* 272* 102* 135*  BUN <3* 24* 17 27* 18  CREATININE 0.76 4.36* 3.94* 5.62* 4.11*  CALCIUM 8.2* 8.6 9.0 8.8 9.1  PHOS  --   --   --  4.5 4.1   Liver Function Tests:  Recent Labs Lab 03/08/13 0728 03/09/13 0726  ALBUMIN 2.8* 3.0*   No results found for this basename: LIPASE, AMYLASE,  in the last 168 hours No results found for this basename: AMMONIA,  in the last 168 hours CBC:  Recent Labs Lab 03/04/13 0822  03/06/13 0710 03/07/13 0722 03/08/13 0441 03/09/13 0500 03/10/13 0604  WBC 5.0  < > 4.7 4.2 6.0 6.1 7.1  NEUTROABS 3.0  --   --   --   --   --   --   HGB 8.2*  < > 8.0* 8.5* 7.7* 8.7* 8.6*  HCT 24.9*  < > 24.2* 25.3* 23.9* 26.9* 26.9*  MCV 92.9  < > 92.0 92.0 94.1 95.4 95.4  PLT 168  < > 161 174 195 244 277  < > = values in this interval not displayed. Cardiac Enzymes: No results found for this basename: CKTOTAL, CKMB, CKMBINDEX, TROPONINI,  in the last 168 hours BNP (last 3 results) No results found for this basename: PROBNP,  in the last 8760 hours CBG:  Recent Labs Lab 03/09/13 2200 03/10/13 0338 03/10/13 0400 03/10/13 0743 03/10/13 1128  GLUCAP 81 17* 90 126* 130*    Recent Results (from the past 240 hour(s))  WOUND CULTURE     Status: None   Collection Time    03/03/13  2:46 AM      Result Value Range Status   Specimen Description WOUND LEFT THIGH GRAFT   Final   Special Requests NONE PT ON VANCOMYCIN   Final   Gram Stain     Final   Value: NO WBC SEEN     NO SQUAMOUS EPITHELIAL CELLS SEEN     NO ORGANISMS SEEN     Performed at [facility]   Culture     Final   Value: NO GROWTH 2 DAYS     Performed at [facility]   Report Status 03/05/2013 FINAL   Final  ANAEROBIC CULTURE     Status: None   Collection Time    03/03/13  2:46 AM      Result Value Range Status   Specimen Description WOUND LEFT THIGH GRAFT   Final   Special Requests PT ON VANCOMYCIN   Final   Gram Stain  Final   Value: NO WBC SEEN     NO SQUAMOUS EPITHELIAL CELLS SEEN     NO ORGANISMS SEEN     Performed at [facility]   Culture     Final   Value: NO ANAEROBES ISOLATED     Performed at [facility]   Report Status 03/08/2013 FINAL   Final     Studies: No results found.  Scheduled Meds: . amLODipine  10 mg Oral QHS  . aspirin EC  81 mg Oral Daily  . calcium acetate (Phos Binder)  667 mg Oral TID WC  . cloNIDine  0.2 mg Oral TID  . darbepoetin (ARANESP) injection - DIALYSIS  100 mcg Intravenous Q Wed-HD  . docusate sodium  100 mg Oral BID  . feeding supplement (RESOURCE BREEZE)  1 Container Oral BID BM  . ferric gluconate (FERRLECIT/NULECIT) IV  62.5 mg Intravenous Q Wed-HD  . FLUoxetine  20 mg Oral Daily  . insulin aspart  0-15 Units Subcutaneous TID WC  . insulin glargine  5 Units Subcutaneous QHS  . levothyroxine  112 mcg Oral QAC breakfast  . metoprolol  75 mg Oral BID  . multivitamin  1 tablet Oral QHS  . pantoprazole  40 mg Oral Daily  . polyethylene glycol  17 g Oral BID  . simvastatin  5 mg Oral q1800  . sodium chloride  3 mL Intravenous Q12H  . temazepam  15 mg Oral Custom  . warfarin  7.5 mg Oral NOW  . Warfarin - Pharmacist Dosing Inpatient   Does not apply q1800   Continuous Infusions: . heparin 1,650 Units/hr (03/10/13 1238)      Time spent: 20 minutes    Farryn Linares  [facility] Pager [phone] If 7PM-7AM, please contact night-coverage at [email or web address], password [facility] 03/10/2013, 3:26 PM  LOS: 13 days

## 2013-03-10 NOTE — Progress Notes (Signed)
[facility] Progress Note  Subjective:   Eating and swallowing ok; right arm getting better  Objective Filed Vitals:   03/09/13 1322 03/09/13 1730 03/09/13 2202 03/10/13 0424  BP: 110/55 170/70 101/51 163/68  Pulse: 55 55 55 54  Temp: 98.4 F (36.9 C) 97.7 F (36.5 C) 99.2 F (37.3 C) 97.3 F (36.3 C)  TempSrc: Oral Oral Oral Oral  Resp: 16 18 19 16   Height:      Weight:   60.05 kg (132 lb 6.2 oz)   SpO2: 100% 98% 99% 100%   Physical Exam General: NAD feeding self breakfast - eating everything Heart: RRR Lungs: no wheezes or rales Abdomen: soft NT Extremities: no sig LE edema bilaterally Dialysis Access: Rt fem TDC (RFA AVG ligated, L thigh AVG ligated)   Dialysis Orders: [place] MWF  4:15 hrs 61kg 2K/2.25Ca Heparin 2400 R thigh cath  Hectorol none (pth 56) Epogen 6800 Venofer 50mg  per week   Assessment/Plan:  1. RUE DVT: R arm AVG ligated 11/13 due to venous HTN, Hep/Coumadin per primary. INR still subtherapeutic , up to 1.62 today - titrating up 2. L thigh AVG bleed - ligated 11/17, intraop cx's negative. ? Options for permanent access Dr. Arlean Hopping has d/w [facility], nothing to be determined at this time, [facility] will reevaluate in op setting after discharge and pt stabilized  3. ESRD: MWF, Dialyzed off sched due to vol xs and holiday schedule. Next HD Tues.  4. HTN/volume: SBPs variable better on clonidine, norvasc and metroprolol; Vol xs resolved. Under edw. Lower at d/c UF Sat 3.3 L  And Sun 2.5 L with post weight 58.2 kg 5. Anemia - Hgb 8.6 stable s/p 2 units PRBCs. Continue Aranesp 100 q Wed. Weekly Fe on Tuesdays.   6. Metabolic bone disease - Ca and phos controlled.  No Vit D.  7. Nutrition - Dys 3 renal diet with fluid restr, multivitamin 8. Goiter - Dr. Gerrit Friends managing, not a surgical candidate 9. DM Type 1 - per primary;  10. Dispo - lives with his sister and her friend, need to verify; INR follow up with PCP. Rx for Phoslyra 5ml ac if still having trouble  swallowing  Sheffield Slider, PA-C  [facility] [phone] 03/10/2013,8:42 AM  LOS: 13 days   Renal Attending: Agree with a/p as articulated above. Pt looks very good.  Anticoagulation restarted and will complicate things some. I called [facility], pt has appt on Dec 17 with Dr. Edilia Bo.  Will ask for reevaluation with plan of access care arranged prior to discharge to expedite access management.  Kaylla Cobos C  Additional Objective Labs: Lab Results  Component Value Date   INR 1.62* 03/10/2013   INR 1.61* 03/09/2013   INR 1.47 03/08/2013   Basic Metabolic Panel:  Recent Labs Lab 03/03/13 1500  03/07/13 0722 03/08/13 0728 03/09/13 0726  NA 129*  < > 129* 133* 132*  K 5.0  < > 4.6 4.7 3.8  CL 92*  < > 93* 95* 94*  CO2 24  < > 24 25 27   GLUCOSE 169*  < > 272* 102* 135*  BUN 42*  < > 17 27* 18  CREATININE 6.85*  < > 3.94* 5.62* 4.11*  CALCIUM 8.6  < > 9.0 8.8 9.1  PHOS 3.9  --   --  4.5 4.1  < > = values in this interval not displayed. Liver Function Tests:  Recent Labs Lab 03/03/13 1500 03/08/13 0728 03/09/13 0726  ALBUMIN 2.6* 2.8* 3.0*  CBC:  Recent Labs Lab 03/04/13 0822  03/06/13 0710 03/07/13 0722 03/08/13 0441 03/09/13 0500 03/10/13 0604  WBC 5.0  < > 4.7 4.2 6.0 6.1 7.1  NEUTROABS 3.0  --   --   --   --   --   --   HGB 8.2*  < > 8.0* 8.5* 7.7* 8.7* 8.6*  HCT 24.9*  < > 24.2* 25.3* 23.9* 26.9* 26.9*  MCV 92.9  < > 92.0 92.0 94.1 95.4 95.4  PLT 168  < > 161 174 195 244 277  < > = values in this interval not displayed.  CBG:  Recent Labs Lab 03/09/13 1633 03/09/13 2200 03/10/13 0338 03/10/13 0400 03/10/13 0743  GLUCAP 392* 81 17* 90 126*  Medications: . heparin 1,550 Units/hr (03/09/13 2228)   . amLODipine  10 mg Oral QHS  . aspirin EC  81 mg Oral Daily  . calcium acetate (Phos Binder)  667 mg Oral TID WC  . cloNIDine  0.2 mg Oral TID  . darbepoetin (ARANESP) injection - DIALYSIS  100 mcg Intravenous Q Wed-HD  .  docusate sodium  100 mg Oral BID  . feeding supplement (RESOURCE BREEZE)  1 Container Oral BID BM  . ferric gluconate (FERRLECIT/NULECIT) IV  62.5 mg Intravenous Q Wed-HD  . FLUoxetine  20 mg Oral Daily  . insulin aspart  0-15 Units Subcutaneous TID WC  . insulin glargine  5 Units Subcutaneous QHS  . levothyroxine  112 mcg Oral QAC breakfast  . metoprolol  75 mg Oral BID  . multivitamin  1 tablet Oral QHS  . pantoprazole  40 mg Oral Daily  . polyethylene glycol  17 g Oral BID  . simvastatin  5 mg Oral q1800  . sodium chloride  3 mL Intravenous Q12H  . temazepam  15 mg Oral Custom  . Warfarin - Pharmacist Dosing Inpatient   Does not apply [phone]

## 2013-03-10 NOTE — Progress Notes (Signed)
PHARMACY FOLLOW UP NOTE   Pharmacy Consult for : Coumadin with Heparin bridging  Indication: RUE DVT and RIJ thrombus  Dosing Weight: 60 kg  Labs:  Recent Labs  03/08/13 0441 03/08/13 0728 03/08/13 1220 03/09/13 0023 03/09/13 0500 03/09/13 0726 03/09/13 1250 03/09/13 2315 03/10/13 0604  HGB 7.7*  --   --   --  8.7*  --   --   --  8.6*  HCT 23.9*  --   --   --  26.9*  --   --   --  26.9*  PLT 195  --   --   --  244  --   --   --  277  LABPROT  --   --  17.4*  --  18.7*  --   --   --  18.8*  INR  --   --  1.47  --  1.61*  --   --   --  1.62*  HEPARINUNFRC  --   --  0.66 0.28*  --   --  1.03* 0.29* 0.27*  CREATININE  --  5.62*  --   --   --  4.11*  --   --   --    Lab Results  Component Value Date   INR 1.62* 03/10/2013   INR 1.61* 03/09/2013   INR 1.47 03/08/2013    Estimated Creatinine Clearance: 16.9 ml/min (by C-G formula based on Cr of 4.11).  Pertinent Medications:  Scheduled:  . amLODipine  10 mg Oral QHS  . aspirin EC  81 mg Oral Daily  . calcium acetate (Phos Binder)  667 mg Oral TID WC  . cloNIDine  0.2 mg Oral TID  . darbepoetin (ARANESP) injection - DIALYSIS  100 mcg Intravenous Q Wed-HD  . docusate sodium  100 mg Oral BID  . feeding supplement (RESOURCE BREEZE)  1 Container Oral BID BM  . ferric gluconate (FERRLECIT/NULECIT) IV  62.5 mg Intravenous Q Wed-HD  . FLUoxetine  20 mg Oral Daily  . insulin aspart  0-15 Units Subcutaneous TID WC  . insulin glargine  5 Units Subcutaneous QHS  . levothyroxine  112 mcg Oral QAC breakfast  . metoprolol  75 mg Oral BID  . multivitamin  1 tablet Oral QHS  . pantoprazole  40 mg Oral Daily  . polyethylene glycol  17 g Oral BID  . simvastatin  5 mg Oral q1800  . sodium chloride  3 mL Intravenous Q12H  . temazepam  15 mg Oral Custom  . Warfarin - Pharmacist Dosing Inpatient   Does not apply q1800   Infusions:  . heparin 1,550 Units/hr (03/09/13 2228)   Anti-infectives   Start     Dose/Rate Route Frequency  Ordered Stop   02/27/13 0600  [MAR Hold]  vancomycin (VANCOCIN) powder 1,000 mg     (On MAR Hold since 02/27/13 0842)   1,000 mg Other To Surgery 02/25/13 1522 02/27/13 1025      Assessment:  57 yo male with RUE DVT and RIJ thrombus is currently on Coumadin with Heparin bridging.  INR remains SUBtherapeutic.  Heparin rate 1550 units/hr.  Heparin level slightly below goal.  No bleeding complications noted   Goal:  INR 2-3  Heparin level 0.3-0.5 units/ml   Plan: 1. Increase Heparin rate to 1650 units/hr.  Will check Heparin Level in 8 hours 2. Coumadin 7.5 mg po now.  [patient awaiting therapeutic INR for discharge]. 3. Daily Heparin Levels, INR, CBC.  Monitor for bleeding complications.  Tucker Minter, Deetta Perla.D 03/10/2013, 11:35 AM

## 2013-03-11 DIAGNOSIS — N186 End stage renal disease: Secondary | ICD-10-CM

## 2013-03-11 DIAGNOSIS — E43 Unspecified severe protein-calorie malnutrition: Secondary | ICD-10-CM

## 2013-03-11 DIAGNOSIS — E079 Disorder of thyroid, unspecified: Secondary | ICD-10-CM

## 2013-03-11 LAB — RENAL FUNCTION PANEL
Albumin: 2.8 g/dL — ABNORMAL LOW (ref 3.5–5.2)
BUN: 28 mg/dL — ABNORMAL HIGH (ref 6–23)
CO2: 25 mEq/L (ref 19–32)
Calcium: 9.1 mg/dL (ref 8.4–10.5)
Chloride: 94 mEq/L — ABNORMAL LOW (ref 96–112)
Creatinine, Ser: 5.88 mg/dL — ABNORMAL HIGH (ref 0.50–1.35)
GFR calc Af Amer: 11 mL/min — ABNORMAL LOW (ref >90)
GFR calc non Af Amer: 10 mL/min — ABNORMAL LOW (ref >90)
Glucose, Bld: 164 mg/dL — ABNORMAL HIGH (ref 70–99)
Phosphorus: 4.3 mg/dL (ref 2.3–4.6)
Potassium: 4.3 mEq/L (ref 3.5–5.1)
Sodium: 132 mEq/L — ABNORMAL LOW (ref 135–145)

## 2013-03-11 LAB — HEPARIN LEVEL (UNFRACTIONATED): Heparin Unfractionated: 1.8 IU/mL — ABNORMAL HIGH (ref 0.30–0.70)

## 2013-03-11 LAB — CBC
HCT: 24 % — ABNORMAL LOW (ref 39.0–52.0)
Hemoglobin: 7.7 g/dL — ABNORMAL LOW (ref 13.0–17.0)
MCH: 30.6 pg (ref 26.0–34.0)
MCHC: 32.1 g/dL (ref 30.0–36.0)
MCV: 95.2 fL (ref 78.0–100.0)
Platelets: 252 10*3/uL (ref 150–400)
RBC: 2.52 MIL/uL — ABNORMAL LOW (ref 4.22–5.81)
RDW: 15.6 % — ABNORMAL HIGH (ref 11.5–15.5)
WBC: 5.9 10*3/uL (ref 4.0–10.5)

## 2013-03-11 LAB — GLUCOSE, CAPILLARY: Glucose-Capillary: 64 mg/dL — ABNORMAL LOW (ref 70–99)

## 2013-03-11 LAB — PROTIME-INR
INR: 1.9 — ABNORMAL HIGH (ref 0.00–1.49)
Prothrombin Time: 21.2 seconds — ABNORMAL HIGH (ref 11.6–15.2)

## 2013-03-11 MED ORDER — WARFARIN SODIUM 7.5 MG PO TABS: 7.5000 mg | ORAL_TABLET | Freq: Every day | ORAL | Status: DC

## 2013-03-11 MED ORDER — LEVOTHYROXINE SODIUM 112 MCG PO TABS: 150.0000 ug | ORAL_TABLET | Freq: Every day | ORAL | Status: DC

## 2013-03-11 MED ORDER — INSULIN GLARGINE 100 UNIT/ML ~~LOC~~ SOLN: 5.0000 [IU] | Freq: Every day | SUBCUTANEOUS | Status: DC

## 2013-03-11 MED ORDER — ENOXAPARIN SODIUM 40 MG/0.4ML ~~LOC~~ SOLN: 50.0000 mg | SUBCUTANEOUS | Status: DC

## 2013-03-11 MED ORDER — DOXERCALCIFEROL 4 MCG/2ML IV SOLN
INTRAVENOUS | Status: AC
  Filled 2013-03-11: qty 2

## 2013-03-11 MED ORDER — INSULIN ASPART 100 UNIT/ML ~~LOC~~ SOLN: 2.0000 [IU] | Freq: Three times a day (TID) | SUBCUTANEOUS | Status: DC

## 2013-03-11 MED ORDER — LEVOTHYROXINE SODIUM 50 MCG PO TABS: 50.0000 ug | ORAL_TABLET | Freq: Every day | ORAL | Status: DC

## 2013-03-11 NOTE — Progress Notes (Signed)
CBG=64. Patient asymptomatic.Tray in room,given.Resource given.

## 2013-03-11 NOTE — Progress Notes (Signed)
   CARE MANAGEMENT NOTE 03/11/2013  Patient:  Marcus, [name]   Account Number:  [id number]  Date Initiated:  02/25/2013  Documentation initiated by:  Marcus [name]  Subjective/Objective Assessment:   Patient transferred from [facility] ED with RUE DVT, thrombus RIJ.  Prior medical hx: ESRD/HD, thyroidectomy 10/14.     Action/Plan:   Progression of care and discharge planning  02/27/2013  R AV graft ligation, continues on hep gtt   Anticipated DC Date:  03/11/2013   Anticipated DC Plan:  HOME W HOME HEALTH SERVICES      DC Planning Services  CM consult      [facility] Choice  HOME HEALTH   Choice offered to / List presented to:          [facility] arranged  HH-4 NURSE'S AIDE  HH-1 RN  HH-2 PT  HH-3 OT      [facility] agency  Care [facility]   Status of service:  Completed, signed off Medicare Important Message given?   (If response is "NO", the following Medicare IM given date fields will be blank) Date Medicare IM given:   Date Additional Medicare IM given:    Discharge Disposition:  HOME W HOME HEALTH SERVICES  Per UR Regulation:    If discussed at Long Length of Stay Meetings, dates discussed:   03/11/2013    Comments:  03/11/2013  [address] RN, [place] [phone] CM referral:  HHRN, PT, OT, aide Met with patient regarding home health services, he selected [facility]. verified phone contact: [phone] Charletta Cousin [phone] called with referral plan discharge today, Lovenox injection 03/12/13 and 03/13/13, PT, OT, aide   03/05/2013  [address] RN, [place]  [phone] patient had [facility] office for home health, discharged from service end of September [facility] notified of admission and NCM will update regarding discharge plans.  03/04/2013  1200 Marcus Russian RN, [place]  [phone] patient transferred to [facility]  03/03/13  1206 Marcus Mayo RN MSN BSN CCM Admitted to 2S s/p ligation of AV graft, placement of Diatek cath.

## 2013-03-11 NOTE — Procedures (Signed)
Tolerating hemodialysis using a right femoral HD PC. No hemodynamic issues.  Appreciate Dr. Darrick Penna evaluation for AV access and would like to have inserted before fully anticoagulated if felt appropriate.  1. RUE DVT: R arm AVG ligated 11/13 due to venous HTN, Hep/Coumadin. INR still subtherapeutic , up to 1.97 today - titrating up but would like groin access before discharge. 2. L thigh AVG bleed - ligated 11/17,  Will see if [facility] will proceed with Femoral AVGG  3. ESRD: MWF, Dialyzed off sched due to vol xs and holiday schedule. .  4. HTN/volume: SBPs variable better on clonidine, norvasc and metroprolol; Vol xs resolved. Under edw. Lower at d/[name] UF Sat 3.3 L And Sun 2.5 L with post weight 58.2 kg 5. Anemia - Hgb down to 7.7gm s/p 2 units PRBCs. Continue Aranesp 100 q Wed. Weekly Fe on Tuesdays.  6. Metabolic bone disease - Ca and phos controlled. No Vit D.  7. Nutrition - Dys 3 renal diet with fluid restr, multivitamin 8. Goiter - Dr. Gerrit Friends managing, not a surgical candidate 9. DM Type 1 - per primary;  10. Dispo - lives with his sister and her friend, need to verify; INR follow by PCP. Will Rx for Phoslyra 5ml ac if still having trouble swallowing    Marcus [name]

## 2013-03-11 NOTE — Discharge Summary (Addendum)
Physician Discharge Summary  Marcus [name] ZOX:[id number] DOB: 1955/09/13 DOA: 02/25/2013  PCP: Galvin Proffer, MD  Admit date: 02/25/2013 Discharge date: 03/11/2013  Time spent: 45 minutes  Recommendations for Outpatient Follow-up:  1. PCP in 1 week 2. AVF planned for Friday 11/28 per [facility] 3. Lovenox bridge 11/26, 11/27 and then post op till INR therapeutic 4. Start Coumadin post AVF placement 11/28   Discharge Diagnoses:    Jugular vein thrombosis, right   RUE DVT   Left thigh graft bleed   ESRD (end stage renal disease) on dialysis   Type I (juvenile type) diabetes mellitus without mention of complication, uncontrolled   Lymphocytic thyroiditis   DVT of axillary vein, acute right   Lymphocytic thyroiditis   Protein-calorie malnutrition, severe   Discharge Condition: stable  Diet recommendation: ESRD  Filed Weights   03/09/13 2202 03/10/13 2101 03/11/13 0700  Weight: 60.05 kg (132 lb 6.2 oz) 60.051 kg (132 lb 6.2 oz) 62 kg (136 lb 11 oz)    History of present illness:  57 y.o. male with h/o ESRD (HD MWF) who presents to [facility] with worsening R arm swelling over the past week  Hospital Course:  57 y.o. male with h/o ESRD (HD MWF) who presents to [facility] with worsening R arm swelling over the past week. His current HD access is in his L thigh and unrelated to this. Patient had a recent thyroid biopsy per Dr.gerkin which showed lymphocytic thyroiditis In the ED at [facility] he was found to have not only RUE DVT but thrombus in his R IJ as well. The enlargement of the thyroid was extending down into his thoracic cavity (unchanged since prior CT). 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 [facility] was thus [facility].  Patient was seen by vascular surgery and underwent ligation of right forearm AV graft. Post procedure he was tolerating this well and started on  Coumadin when on the evening of 11/16, patient started having significant bleeding from the left thigh AV graft. Patient was taken emergently to the OR where he underwent ligation of this graft. In addition and underwent right femoral vein cannulation and tunneled dialysis catheter was placed. He was monitored in the surgical ICU on 11/17-18 and then transferred back to nephrology floor.   Detailed course Rt IJ thrombus/RUE DVT  -was on heparin gtt, which was then on hold due to bleeding fistula,  -INR therapeutic 11/18 then trended down and hence coumadin being bridged with heparin  -INR was subtherapeutic, pharmacy was dosing  Coumadin -INR 1.97 today, hence heparin stopped,  -ideally he would have only continued Coumadin but AVF planned for this Thursday and henc he will discharge home on lovenox and resume coumadin after AVF on FRiday -PCP to please FU bridging lovenox and coumadin post op -Appreciate consultation by general surgery, vascular surgery and interventional radiology.  -s/p Ligation of left AV graft done last week   Left thigh graft bleed:  -resolved  -Greatly appreciate vascular surgery and critical care help.  -status post ligation of left thigh graft and placement of right leg tunnel catheter for HD access   Substernal thyroid goiter/ -Lymphocytic Thyroiditis per recent bx. Per Dr.Gerkin  -given recent complications, not felt to be a surgical candidate per [facility] at this time  -FU with endocrinology regarding options  -TSH significantly elevated at 108 and free T4 1.9, which was unexpected in  this setting, hence synthroid dose increased to this admission from -needs Free T4/T3 and TSH rechecked in 4-6weeks -CT reports unchanged airway compression. patient in no resp distress. does c/o some new dysphasia, seen by ST has seen and put on dysphagia 3 diet   ESRD on HD ( M,W,F)  Using left thigh graft.  Renal following, HD  VVS to get permanent access placed on  Friday 11/28  Type 1 DM  -CBG stable  -lantus, SSI   HTN  continue home meds   Hx of CVA  continue ASA, statin   GERD  continue PPI   Declines SNF for rehabilitation and hence discharged home with [facility] services  Consultants:  [facility]  vascular surgery ( Dr Darrick Penna)  IR  Critical care  Procedures:  For right IJ thrombus ligation done 11/13  Ligation of left AV graft secondary to bleed done 11/17.  Placement of tunneled catheter on right leg 11/17      Discharge Exam: Filed Vitals:   03/11/13 1221  BP: 115/65  Pulse: 50  Temp: 98.2 F (36.8 C)  Resp: 18    General: AAOx3, no distress Cardiovascular: S1S2/RRR Respiratory: CTAB  Discharge Instructions       Future Appointments Provider Department Dept Phone   04/01/2013 10:45 AM Reather Littler, MD [facility] [phone]   04/02/2013 11:00 AM [facility] [place] [facility] [address] [phone]   04/02/2013 11:30 AM [facility] [place] [facility] [address] [phone]   04/02/2013 12:30 PM Chuck Hint, MD [facility] [phone]       Medication List         amLODipine 10 MG tablet  Commonly known as:  NORVASC  Take 10 mg by mouth at bedtime.     aspirin EC 81 MG tablet  Take 81 mg by mouth daily.     calcium acetate 667 MG capsule  Commonly known as:  PHOSLO  Take 1,334 mg by mouth 3 (three) times daily with meals.     cloNIDine 0.1 MG tablet  Commonly known as:  CATAPRES  Take 0.1 mg by mouth 3 (three) times daily.     docusate sodium 100 MG capsule  Commonly known as:  COLACE  Take 100 mg by mouth 2 (two) times daily.     EASY TOUCH INSULIN SYRINGE 31G X 5/16" 0.5 ML Misc  Generic drug:  Insulin Syringe-Needle U-100     enoxaparin 40 MG/0.4ML injection  Commonly known as:  LOVENOX  Inject 0.5 mLs (50 mg total) into the skin daily. Take 50mg  SQ Daily on 11/26 and 11/27     FLUoxetine 10 MG tablet   Commonly known as:  PROZAC  Take 5 mg by mouth every morning.     HYDROcodone-acetaminophen 5-325 MG per tablet  Commonly known as:  NORCO/VICODIN  Take 1-2 tablets by mouth every 4 (four) hours as needed for pain.     insulin aspart 100 UNIT/ML injection  Commonly known as:  novoLOG  Inject 2-3 Units into the skin 3 (three) times daily before meals. Inject 2 units with breakfast, 3 units before lunch, and 3 units before supper     insulin glargine 100 UNIT/ML injection  Commonly known as:  LANTUS  Inject 0.05 mLs (5 Units total) into the skin at bedtime. Inject 13 units in the morning, and 9 units in the evening.     levothyroxine 112 MCG tablet  Commonly known as:  SYNTHROID,  LEVOTHROID  Take 1.5 tablets (168 mcg total) by mouth daily before breakfast.     metoprolol 50 MG tablet  Commonly known as:  LOPRESSOR  Take 75 mg by mouth 2 (two) times daily. Hold morning dose on dialysis days     omeprazole 20 MG capsule  Commonly known as:  PRILOSEC  Take 20 mg by mouth daily.     pravastatin 10 MG tablet  Commonly known as:  PRAVACHOL  Take 10 mg by mouth at bedtime.     PROAIR HFA 108 (90 BASE) MCG/ACT inhaler  Generic drug:  albuterol  Inhale 2 puffs into the lungs every 4 (four) hours as needed for wheezing or shortness of breath.     promethazine 25 MG tablet  Commonly known as:  PHENERGAN  Take 25 mg by mouth every 6 (six) hours as needed. For nausea     sevelamer carbonate 800 MG tablet  Commonly known as:  RENVELA  Take 800 mg by mouth 3 (three) times daily with meals.     temazepam 15 MG capsule  Commonly known as:  RESTORIL  Take 15 mg by mouth 4 (four) times a week. Monday, Wednesday, Friday, saturday     warfarin 7.5 MG tablet  Commonly known as:  COUMADIN  Take 1 tablet (7.5 mg total) by mouth daily. To Start after AVF placed on Friday  Start taking on:  03/14/2013       Allergies  Allergen Reactions  . Penicillins Other (See Comments)    Unknown  reaction   Follow-up Information   Follow up with Chuck Hint, MD On 03/14/2013. (office will call for  AVF)    Specialty:  Vascular Surgery   Contact information:   [address] [place] [address] [phone]       Follow up with Galvin Proffer, MD In 1 week.   Specialty:  Internal Medicine   Contact information:   [address] [place] [address] [phone]        The results of significant diagnostics from this hospitalization (including imaging, microbiology, ancillary and laboratory) are listed below for reference.    Significant Diagnostic Studies: Portable Chest Xray  03/03/2013   CLINICAL DATA:  Endotracheal tube placement.  EXAM: PORTABLE CHEST - 1 VIEW  COMPARISON:  02/24/2013  FINDINGS: Endotracheal tube ends 3 cm above the carina. Gastric suction tube side port ends in the distal esophageal region, approximately 6 cm above the carina. Dialysis catheter, split tip, from below, with tips terminating in the right atrial region. The medial tip it is directed towards the atrioventricular valve.  Unchanged appearance of right brachiocephalic vein stenting, with wasting cranially.  Mild lower lung atelectasis.  Normal heart size. Widened upper mediastinum related to known, large goiter.  For discussion at am rounds, these results were called by telephone at the time of interpretation on 03/03/2013 at 4:04 AM to RN Puja, who verbally acknowledged these results.  IMPRESSION: 1. Endotracheal tube in good position. 2. Mildly short nasogastric tube, with side port in the distal esophageal region. Advancement by 5 cm would provide more optimal positioning. 3. Dialysis catheter from below is redundant, with 1 of the tips in the region of the atrioventricular valve.   Electronically Signed   By: Tiburcio Pea M.D.   On: 03/03/2013 04:06   Dg Fluoro Guide Cv Line-no Report  03/03/2013   CLINICAL DATA: Bleeding Thigh Graft   FLOURO GUIDE CV LINE  Fluoroscopy was  utilized by the  requesting physician.  No radiographic  interpretation.     Microbiology: Recent Results (from the past 240 hour(s))  WOUND CULTURE     Status: None   Collection Time    03/03/13  2:46 AM      Result Value Range Status   Specimen Description WOUND LEFT THIGH GRAFT   Final   Special Requests NONE PT ON VANCOMYCIN   Final   Gram Stain     Final   Value: NO WBC SEEN     NO SQUAMOUS EPITHELIAL CELLS SEEN     NO ORGANISMS SEEN     Performed at [facility]   Culture     Final   Value: NO GROWTH 2 DAYS     Performed at [facility]   Report Status 03/05/2013 FINAL   Final  ANAEROBIC CULTURE     Status: None   Collection Time    03/03/13  2:46 AM      Result Value Range Status   Specimen Description WOUND LEFT THIGH GRAFT   Final   Special Requests PT ON VANCOMYCIN   Final   Gram Stain     Final   Value: NO WBC SEEN     NO SQUAMOUS EPITHELIAL CELLS SEEN     NO ORGANISMS SEEN     Performed at [facility]   Culture     Final   Value: NO ANAEROBES ISOLATED     Performed at [facility]   Report Status 03/08/2013 FINAL   Final     Labs: Basic Metabolic Panel:  Recent Labs Lab 03/06/13 0644 03/07/13 0722 03/08/13 0728 03/09/13 0726 03/11/13 0708  NA 128* 129* 133* 132* 132*  K 4.8 4.6 4.7 3.8 4.3  CL 91* 93* 95* 94* 94*  CO2 22 24 25 27 25   GLUCOSE 374* 272* 102* 135* 164*  BUN 24* 17 27* 18 28*  CREATININE 4.36* 3.94* 5.62* 4.11* 5.88*  CALCIUM 8.6 9.0 8.8 9.1 9.1  PHOS  --   --  4.5 4.1 4.3   Liver Function Tests:  Recent Labs Lab 03/08/13 0728 03/09/13 0726 03/11/13 0708  ALBUMIN 2.8* 3.0* 2.8*   No results found for this basename: LIPASE, AMYLASE,  in the last 168 hours No results found for this basename: AMMONIA,  in the last 168 hours CBC:  Recent Labs Lab 03/07/13 0722 03/08/13 0441 03/09/13 0500 03/10/13 0604 03/11/13 0708  WBC 4.2 6.0 6.1 7.1 5.9  HGB 8.5* 7.7* 8.7* 8.6* 7.7*  HCT 25.3* 23.9*  26.9* 26.9* 24.0*  MCV 92.0 94.1 95.4 95.4 95.2  PLT 174 195 244 277 252   Cardiac Enzymes: No results found for this basename: CKTOTAL, CKMB, CKMBINDEX, TROPONINI,  in the last 168 hours BNP: BNP (last 3 results) No results found for this basename: PROBNP,  in the last 8760 hours CBG:  Recent Labs Lab 03/10/13 0743 03/10/13 1128 03/10/13 1623 03/10/13 2104 03/11/13 1211  GLUCAP 126* 130* 117* 206* 64*       Signed:  Tanesha Arambula  [facility] 03/11/2013, 2:03 PM

## 2013-03-11 NOTE — Progress Notes (Signed)
PHARMACY FOLLOW UP NOTE   Pharmacy Consult for : Coumadin with Heparin bridging  Indication: RUE DVT and RIJ thrombus  Dosing Weight: 62 kg  Labs:  Recent Labs  03/08/13 1220 03/09/13 0023 03/09/13 0500 03/09/13 1250 03/09/13 2315 03/10/13 0604 03/10/13 2000 03/11/13 0700 03/11/13 0708  HGB  --   --  8.7*  --   --  8.6*  --   --  7.7*  HCT  --   --  26.9*  --   --  26.9*  --   --  24.0*  PLT  --   --  244  --   --  277  --   --  252  LABPROT 17.4*  --  18.7*  --   --  18.8*  --  21.8*  --   INR 1.47  --  1.61*  --   --  1.62*  --  1.97*  --   HEPARINUNFC 0.66 0.28*  --  1.03* 0.29* 0.27* <0.10*  --   --    Lab Results  Component Value Date   INR 1.97* 03/11/2013   INR 1.62* 03/10/2013   INR 1.61* 03/09/2013    Estimated Creatinine Clearance: 12.2 ml/min (by C-G formula based on Cr of 5.88).  Pertinent Medications:  Scheduled:  . amLODipine  10 mg Oral QHS  . aspirin EC  81 mg Oral Daily  . calcium acetate (Phos Binder)  667 mg Oral TID WC  . cloNIDine  0.2 mg Oral TID  . darbepoetin (ARANESP) injection - DIALYSIS  100 mcg Intravenous Q Wed-HD  . docusate sodium  100 mg Oral BID  . feeding supplement (RESOURCE BREEZE)  1 Container Oral BID BM  . ferric gluconate (FERRLECIT/NULECIT) IV  62.5 mg Intravenous Q Wed-HD  . FLUoxetine  20 mg Oral Daily  . insulin aspart  0-15 Units Subcutaneous TID WC  . insulin glargine  5 Units Subcutaneous QHS  . levothyroxine  112 mcg Oral QAC breakfast  . metoprolol  75 mg Oral BID  . multivitamin  1 tablet Oral QHS  . pantoprazole  40 mg Oral Daily  . polyethylene glycol  17 g Oral BID  . simvastatin  5 mg Oral q1800  . sodium chloride  3 mL Intravenous Q12H  . temazepam  15 mg Oral Custom  . Warfarin - Pharmacist Dosing Inpatient   Does not apply q1800   Infusions:  . heparin 1,650 Units/hr (03/10/13 1845)    Assessment:  57 yo male with RUE DVT and RIJ thrombus is currently on Coumadin with Heparin bridging  INR  1.97 .  Heparin Level supra-therapeutic while in dialysis.  No bleeding complications noted   Goal:  INR 2-3  Heparin level 0.3 - 0.5   Plan: 1. Coumadin per MD with planned discharge, later today.  Repeat INR tomorrow as an outpatient with Home Health RN to draw. 2. DC Heparin  Dmitry Macomber J  Pharm.D 03/11/2013, 8:09 AM

## 2013-03-11 NOTE — Progress Notes (Signed)
Patient ID: Shlome Baldree, male   DOB: 1956-03-01, 57 y.o.   MRN: [id number]  General Surgery - [facility] - Progress Note  Subjective: Patient in hemodialysis.  No complaints.  States he is swallowing better.  Arm swelling "about gone".  Objective: Vital signs in last 24 hours: Temp:  [97.8 F (36.6 C)-98.5 F (36.9 C)] 98.4 F (36.9 C) (11/25 0700) Pulse Rate:  [50-57] 50 (11/25 1030) Resp:  [18-20] 20 (11/25 1030) BP: (111-175)/(62-81) 111/66 mmHg (11/25 1030) SpO2:  [100 %] 100 % (11/25 0700) Weight:  [132 lb 6.2 oz (60.051 kg)-136 lb 11 oz (62 kg)] 136 lb 11 oz (62 kg) (11/25 0700) Last BM Date: 03/10/13  Intake/Output from previous day: 11/24 0701 - 11/25 0700 In: 970 [P.O.:970] Out: 1 [Stool:1]  Exam: HEENT - clear, not icteric Neck - fullness anterior neck; no seroma or hematoma; incision well healed Chest - coarse bilaterally with rhonchi Cor - RRR, no murmur Abd - soft without distension Ext - no significant edema in right upper extremity  Lab Results:   Recent Labs  03/10/13 0604 03/11/13 0708  WBC 7.1 5.9  HGB 8.6* 7.7*  HCT 26.9* 24.0*  PLT 277 252     Recent Labs  03/09/13 0726 03/11/13 0708  NA 132* 132*  K 3.8 4.3  CL 94* 94*  CO2 27 25  GLUCOSE 135* 164*  BUN 18 28*  CREATININE 4.11* 5.88*  CALCIUM 9.1 9.1    Studies/Results: No results found.  Assessment / Plan: 1.  Substernal thyroid goiter, lymphocytic thyroiditis  Will arrange out-patient endocrine consultation  Velora Heckler, MD, [facility] Office: [phone]  03/11/2013

## 2013-03-11 NOTE — Progress Notes (Signed)
Verified with dialysis center in [place] that patient has received his flu vaccine. Per their documentation, pt rec'd his flu vaccine on 02/07/13.   Kathlene November, Trulee Hamstra Redkey

## 2013-03-12 ENCOUNTER — Other Ambulatory Visit: Payer: Self-pay | Admitting: *Deleted

## 2013-03-12 ENCOUNTER — Other Ambulatory Visit: Payer: Medicare Other

## 2013-03-12 LAB — BASIC METABOLIC PANEL
Chloride: 94 mEq/L — ABNORMAL LOW (ref 96–112)
Creatinine, Ser: 0.76 mg/dL (ref 0.50–1.35)
GFR calc Af Amer: >90 mL/min (ref >90)
Potassium: <2 mEq/L — CL (ref 3.5–5.1)
Sodium: 135 mEq/L (ref 135–145)

## 2013-03-13 MED ORDER — VANCOMYCIN HCL 10 G IV SOLR
1500.0000 mg | INTRAVENOUS | Status: AC
  Administered 2013-04-16: 1000 mg via INTRAVENOUS

## 2013-03-14 ENCOUNTER — Encounter (HOSPITAL_COMMUNITY)
Admission: RE | Disposition: A | Discharge status: Home / Self Care | Payer: Self-pay | Source: Ambulatory Visit | Attending: Vascular Surgery

## 2013-03-14 ENCOUNTER — Encounter (HOSPITAL_COMMUNITY): Payer: Self-pay | Admitting: Surgery

## 2013-03-14 ENCOUNTER — Observation Stay (HOSPITAL_COMMUNITY)
Admission: RE | Admit: 2013-03-14 | Discharge: 2013-03-16 | Disposition: A | Discharge status: Home / Self Care | Payer: Medicare Other | Source: Ambulatory Visit | Attending: Vascular Surgery | Admitting: Vascular Surgery

## 2013-03-14 ENCOUNTER — Encounter (HOSPITAL_COMMUNITY): Payer: Medicare Other | Admitting: Certified Registered"

## 2013-03-14 ENCOUNTER — Inpatient Hospital Stay (HOSPITAL_COMMUNITY): Payer: Medicare Other | Admitting: Certified Registered"

## 2013-03-14 DIAGNOSIS — I120 Hypertensive chronic kidney disease with stage 5 chronic kidney disease or end stage renal disease: Secondary | ICD-10-CM | POA: Insufficient documentation

## 2013-03-14 DIAGNOSIS — G8918 Other acute postprocedural pain: Secondary | ICD-10-CM

## 2013-03-14 DIAGNOSIS — F3289 Other specified depressive episodes: Secondary | ICD-10-CM | POA: Insufficient documentation

## 2013-03-14 DIAGNOSIS — E1010 Type 1 diabetes mellitus with ketoacidosis without coma: Secondary | ICD-10-CM | POA: Insufficient documentation

## 2013-03-14 DIAGNOSIS — N186 End stage renal disease: Secondary | ICD-10-CM | POA: Diagnosis present

## 2013-03-14 DIAGNOSIS — Y832 Surgical operation with anastomosis, bypass or graft as the cause of abnormal reaction of the patient, or of later complication, without mention of misadventure at the time of the procedure: Secondary | ICD-10-CM | POA: Insufficient documentation

## 2013-03-14 DIAGNOSIS — T82898A Other specified complication of vascular prosthetic devices, implants and grafts, initial encounter: Principal | ICD-10-CM | POA: Insufficient documentation

## 2013-03-14 DIAGNOSIS — F329 Major depressive disorder, single episode, unspecified: Secondary | ICD-10-CM | POA: Insufficient documentation

## 2013-03-14 DIAGNOSIS — F17200 Nicotine dependence, unspecified, uncomplicated: Secondary | ICD-10-CM | POA: Insufficient documentation

## 2013-03-14 DIAGNOSIS — E039 Hypothyroidism, unspecified: Secondary | ICD-10-CM | POA: Insufficient documentation

## 2013-03-14 DIAGNOSIS — M7989 Other specified soft tissue disorders: Secondary | ICD-10-CM | POA: Insufficient documentation

## 2013-03-14 DIAGNOSIS — E785 Hyperlipidemia, unspecified: Secondary | ICD-10-CM | POA: Insufficient documentation

## 2013-03-14 DIAGNOSIS — I739 Peripheral vascular disease, unspecified: Secondary | ICD-10-CM | POA: Insufficient documentation

## 2013-03-14 DIAGNOSIS — K219 Gastro-esophageal reflux disease without esophagitis: Secondary | ICD-10-CM | POA: Insufficient documentation

## 2013-03-14 DIAGNOSIS — Z8673 Personal history of transient ischemic attack (TIA), and cerebral infarction without residual deficits: Secondary | ICD-10-CM | POA: Insufficient documentation

## 2013-03-14 HISTORY — DX: Dependence on renal dialysis: Z99.2

## 2013-03-14 HISTORY — PX: AV FISTULA PLACEMENT: SHX1204

## 2013-03-14 LAB — POCT I-STAT 4, (NA,K, GLUC, HGB,HCT)
Glucose, Bld: 54 mg/dL — ABNORMAL LOW (ref 70–99)
HCT: 29 % — ABNORMAL LOW (ref 39.0–52.0)
Hemoglobin: 9.9 g/dL — ABNORMAL LOW (ref 13.0–17.0)
Sodium: 141 mEq/L (ref 135–145)

## 2013-03-14 LAB — GLUCOSE, CAPILLARY
Glucose-Capillary: 92 mg/dL (ref 70–99)
Glucose-Capillary: 71 mg/dL (ref 70–99)
Glucose-Capillary: 154 mg/dL — ABNORMAL HIGH (ref 70–99)

## 2013-03-14 SURGERY — INSERTION OF ARTERIOVENOUS (AV) GORE-TEX GRAFT THIGH
Anesthesia: General | Site: Thigh | Laterality: Left | Wound class: Clean

## 2013-03-14 MED ORDER — DEXTROSE 50 % IV SOLN
INTRAVENOUS | Status: AC
  Administered 2013-03-14: 25 mL via INTRAVENOUS
  Filled 2013-03-14: qty 50

## 2013-03-14 MED ORDER — SODIUM CHLORIDE 0.9 % IJ SOLN: 3.0000 mL | INTRAMUSCULAR | Status: DC | PRN

## 2013-03-14 MED ORDER — PROMETHAZINE HCL 25 MG/ML IJ SOLN: 6.2500 mg | INTRAMUSCULAR | Status: DC | PRN

## 2013-03-14 MED ORDER — TEMAZEPAM 15 MG PO CAPS
15.0000 mg | ORAL_CAPSULE | ORAL | Status: DC
  Administered 2013-03-15: 15 mg via ORAL
  Filled 2013-03-14: qty 1

## 2013-03-14 MED ORDER — PHENOL 1.4 % MT LIQD
1.0000 | OROMUCOSAL | Status: DC | PRN
  Filled 2013-03-14: qty 177

## 2013-03-14 MED ORDER — OXYCODONE HCL 5 MG PO TABS: 5.0000 mg | ORAL_TABLET | Freq: Once | ORAL | Status: DC | PRN

## 2013-03-14 MED ORDER — HYDROCODONE-ACETAMINOPHEN 5-325 MG PO TABS
1.0000 | ORAL_TABLET | ORAL | Status: DC | PRN
  Administered 2013-03-14 (×2): 2 via ORAL
  Administered 2013-03-15: 1 via ORAL
  Administered 2013-03-16: 2 via ORAL
  Filled 2013-03-14: qty 2
  Filled 2013-03-14: qty 1
  Filled 2013-03-14 (×2): qty 2

## 2013-03-14 MED ORDER — WARFARIN SODIUM 7.5 MG PO TABS
7.5000 mg | ORAL_TABLET | Freq: Every day | ORAL | Status: DC
  Administered 2013-03-15: 7.5 mg via ORAL
  Filled 2013-03-14 (×2): qty 1

## 2013-03-14 MED ORDER — METOPROLOL TARTRATE 1 MG/ML IV SOLN: 2.0000 mg | INTRAVENOUS | Status: DC | PRN

## 2013-03-14 MED ORDER — ALBUTEROL SULFATE HFA 108 (90 BASE) MCG/ACT IN AERS
2.0000 | INHALATION_SPRAY | RESPIRATORY_TRACT | Status: DC | PRN
  Filled 2013-03-14: qty 6.7

## 2013-03-14 MED ORDER — ACETAMINOPHEN 325 MG PO TABS: 325.0000 mg | ORAL_TABLET | ORAL | Status: DC | PRN

## 2013-03-14 MED ORDER — SODIUM CHLORIDE 0.9 % IV SOLN
INTRAVENOUS | Status: DC
  Administered 2013-03-14 (×2): via INTRAVENOUS

## 2013-03-14 MED ORDER — HYDROCODONE-ACETAMINOPHEN 5-325 MG PO TABS: 1.0000 | ORAL_TABLET | ORAL | Status: DC | PRN

## 2013-03-14 MED ORDER — INSULIN ASPART 100 UNIT/ML ~~LOC~~ SOLN: 2.0000 [IU] | Freq: Every day | SUBCUTANEOUS | Status: DC

## 2013-03-14 MED ORDER — AMLODIPINE BESYLATE 10 MG PO TABS
10.0000 mg | ORAL_TABLET | Freq: Every day | ORAL | Status: DC
  Administered 2013-03-14 – 2013-03-15 (×2): 10 mg via ORAL
  Filled 2013-03-14 (×3): qty 1

## 2013-03-14 MED ORDER — SODIUM CHLORIDE 0.9 % IV SOLN: INTRAVENOUS | Status: DC

## 2013-03-14 MED ORDER — INSULIN ASPART 100 UNIT/ML ~~LOC~~ SOLN: 2.0000 [IU] | Freq: Three times a day (TID) | SUBCUTANEOUS | Status: DC

## 2013-03-14 MED ORDER — FLUOXETINE HCL 20 MG/5ML PO SOLN
5.0000 mg | Freq: Every morning | ORAL | Status: DC
  Administered 2013-03-14 – 2013-03-16 (×3): 5 mg via ORAL
  Filled 2013-03-14 (×3): qty 5

## 2013-03-14 MED ORDER — PROPOFOL 10 MG/ML IV BOLUS
INTRAVENOUS | Status: DC | PRN
  Administered 2013-03-14: 140 mg via INTRAVENOUS

## 2013-03-14 MED ORDER — DEXTROSE 50 % IV SOLN
25.0000 mL | Freq: Once | INTRAVENOUS | Status: AC
  Administered 2013-03-14: 25 mL via INTRAVENOUS

## 2013-03-14 MED ORDER — VANCOMYCIN HCL IN DEXTROSE 1-5 GM/200ML-% IV SOLN
INTRAVENOUS | Status: AC
  Administered 2013-03-14: 1000 mg via INTRAVENOUS
  Filled 2013-03-14: qty 200

## 2013-03-14 MED ORDER — SODIUM CHLORIDE 0.9 % IJ SOLN
3.0000 mL | Freq: Two times a day (BID) | INTRAMUSCULAR | Status: DC
  Administered 2013-03-14 – 2013-03-16 (×4): 3 mL via INTRAVENOUS

## 2013-03-14 MED ORDER — LABETALOL HCL 5 MG/ML IV SOLN
10.0000 mg | INTRAVENOUS | Status: DC | PRN
  Filled 2013-03-14: qty 4

## 2013-03-14 MED ORDER — ACETAMINOPHEN 650 MG RE SUPP: 325.0000 mg | RECTAL | Status: DC | PRN

## 2013-03-14 MED ORDER — ONDANSETRON HCL 4 MG/2ML IJ SOLN: 4.0000 mg | Freq: Four times a day (QID) | INTRAMUSCULAR | Status: DC | PRN

## 2013-03-14 MED ORDER — WARFARIN - PHYSICIAN DOSING INPATIENT
Freq: Every day | Status: DC
  Administered 2013-03-14 – 2013-03-15 (×2)

## 2013-03-14 MED ORDER — OXYCODONE HCL 5 MG/5ML PO SOLN: 5.0000 mg | Freq: Once | ORAL | Status: DC | PRN

## 2013-03-14 MED ORDER — 0.9 % SODIUM CHLORIDE (POUR BTL) OPTIME
TOPICAL | Status: DC | PRN
  Administered 2013-03-14: 1000 mL

## 2013-03-14 MED ORDER — RENA-VITE PO TABS
1.0000 | ORAL_TABLET | Freq: Every day | ORAL | Status: DC
  Administered 2013-03-14 – 2013-03-15 (×2): 1 via ORAL
  Filled 2013-03-14 (×3): qty 1

## 2013-03-14 MED ORDER — INSULIN GLARGINE 100 UNIT/ML ~~LOC~~ SOLN
5.0000 [IU] | Freq: Every day | SUBCUTANEOUS | Status: DC
  Administered 2013-03-14: 5 [IU] via SUBCUTANEOUS
  Filled 2013-03-14 (×3): qty 0.05

## 2013-03-14 MED ORDER — SODIUM CHLORIDE 0.9 % IR SOLN
Status: DC | PRN
  Administered 2013-03-14: 08:00:00

## 2013-03-14 MED ORDER — SENNA 8.6 MG PO TABS
1.0000 | ORAL_TABLET | Freq: Two times a day (BID) | ORAL | Status: DC
  Administered 2013-03-14 – 2013-03-16 (×5): 8.6 mg via ORAL
  Filled 2013-03-14 (×6): qty 1

## 2013-03-14 MED ORDER — SODIUM CHLORIDE 0.9 % IV SOLN: 250.0000 mL | INTRAVENOUS | Status: DC | PRN

## 2013-03-14 MED ORDER — HYDRALAZINE HCL 20 MG/ML IJ SOLN: 10.0000 mg | INTRAMUSCULAR | Status: DC | PRN

## 2013-03-14 MED ORDER — FENTANYL CITRATE 0.05 MG/ML IJ SOLN
INTRAMUSCULAR | Status: DC | PRN
  Administered 2013-03-14 (×3): 50 ug via INTRAVENOUS

## 2013-03-14 MED ORDER — LEVOTHYROXINE SODIUM 150 MCG PO TABS
150.0000 ug | ORAL_TABLET | Freq: Every day | ORAL | Status: DC
  Administered 2013-03-15 – 2013-03-16 (×2): 150 ug via ORAL
  Filled 2013-03-14 (×3): qty 1

## 2013-03-14 MED ORDER — MORPHINE SULFATE 2 MG/ML IJ SOLN
2.0000 mg | INTRAMUSCULAR | Status: DC | PRN
  Administered 2013-03-15 (×2): 2 mg via INTRAVENOUS
  Filled 2013-03-14: qty 1

## 2013-03-14 MED ORDER — ASPIRIN EC 81 MG PO TBEC
81.0000 mg | DELAYED_RELEASE_TABLET | Freq: Every day | ORAL | Status: DC
  Administered 2013-03-14 – 2013-03-16 (×3): 81 mg via ORAL
  Filled 2013-03-14 (×3): qty 1

## 2013-03-14 MED ORDER — SEVELAMER CARBONATE 800 MG PO TABS
800.0000 mg | ORAL_TABLET | Freq: Three times a day (TID) | ORAL | Status: DC
  Filled 2013-03-14 (×3): qty 1

## 2013-03-14 MED ORDER — GUAIFENESIN-DM 100-10 MG/5ML PO SYRP
15.0000 mL | ORAL_SOLUTION | ORAL | Status: DC | PRN
  Filled 2013-03-14: qty 15

## 2013-03-14 MED ORDER — ENOXAPARIN SODIUM 60 MG/0.6ML ~~LOC~~ SOLN
50.0000 mg | SUBCUTANEOUS | Status: DC
  Administered 2013-03-15 – 2013-03-16 (×2): 50 mg via SUBCUTANEOUS
  Filled 2013-03-14: qty 0.6
  Filled 2013-03-14: qty 0.8
  Filled 2013-03-14: qty 0.6

## 2013-03-14 MED ORDER — PROMETHAZINE HCL 25 MG PO TABS: 25.0000 mg | ORAL_TABLET | Freq: Four times a day (QID) | ORAL | Status: DC | PRN

## 2013-03-14 MED ORDER — CLONIDINE HCL 0.1 MG PO TABS
0.1000 mg | ORAL_TABLET | Freq: Three times a day (TID) | ORAL | Status: DC
  Administered 2013-03-14 – 2013-03-16 (×6): 0.1 mg via ORAL
  Filled 2013-03-14 (×8): qty 1

## 2013-03-14 MED ORDER — CALCIUM ACETATE 667 MG PO CAPS
1334.0000 mg | ORAL_CAPSULE | Freq: Three times a day (TID) | ORAL | Status: DC
  Administered 2013-03-14 – 2013-03-16 (×5): 1334 mg via ORAL
  Filled 2013-03-14 (×9): qty 2

## 2013-03-14 MED ORDER — ONDANSETRON HCL 4 MG/2ML IJ SOLN
INTRAMUSCULAR | Status: DC | PRN
  Administered 2013-03-14: 4 mg via INTRAVENOUS

## 2013-03-14 MED ORDER — INSULIN ASPART 100 UNIT/ML ~~LOC~~ SOLN
3.0000 [IU] | SUBCUTANEOUS | Status: DC
  Administered 2013-03-15 – 2013-03-16 (×3): 3 [IU] via SUBCUTANEOUS

## 2013-03-14 MED ORDER — METOPROLOL TARTRATE 50 MG PO TABS
75.0000 mg | ORAL_TABLET | Freq: Two times a day (BID) | ORAL | Status: DC
  Administered 2013-03-14 – 2013-03-16 (×4): 75 mg via ORAL
  Filled 2013-03-14 (×6): qty 1

## 2013-03-14 MED ORDER — LIDOCAINE HCL (CARDIAC) 10 MG/ML IV SOLN
INTRAVENOUS | Status: DC | PRN
  Administered 2013-03-14: 100 mg via INTRAVENOUS

## 2013-03-14 MED ORDER — DOCUSATE SODIUM 100 MG PO CAPS
100.0000 mg | ORAL_CAPSULE | Freq: Two times a day (BID) | ORAL | Status: DC
  Administered 2013-03-14 – 2013-03-16 (×5): 100 mg via ORAL
  Filled 2013-03-14 (×6): qty 1

## 2013-03-14 MED ORDER — SIMVASTATIN 5 MG PO TABS
5.0000 mg | ORAL_TABLET | Freq: Every day | ORAL | Status: DC
  Administered 2013-03-14 – 2013-03-15 (×2): 5 mg via ORAL
  Filled 2013-03-14 (×3): qty 1

## 2013-03-14 MED ORDER — PANTOPRAZOLE SODIUM 40 MG PO TBEC
40.0000 mg | DELAYED_RELEASE_TABLET | Freq: Every day | ORAL | Status: DC
  Administered 2013-03-14 – 2013-03-16 (×3): 40 mg via ORAL
  Filled 2013-03-14 (×4): qty 1

## 2013-03-14 MED ORDER — HYDROMORPHONE HCL PF 1 MG/ML IJ SOLN: 0.2500 mg | INTRAMUSCULAR | Status: DC | PRN

## 2013-03-14 MED ORDER — DARBEPOETIN ALFA-POLYSORBATE 60 MCG/0.3ML IJ SOLN: 60.0000 ug | INTRAMUSCULAR | Status: DC

## 2013-03-14 SURGICAL SUPPLY — 42 items
APL SKNCLS STERI-STRIP NONHPOA (GAUZE/BANDAGES/DRESSINGS) ×1
BENZOIN TINCTURE PRP APPL 2/3 (GAUZE/BANDAGES/DRESSINGS) ×2 IMPLANT
CANISTER SUCTION 2500CC (MISCELLANEOUS) ×2 IMPLANT
CLIP LIGATING EXTRA MED SLVR (CLIP) ×2 IMPLANT
CLIP LIGATING EXTRA SM BLUE (MISCELLANEOUS) ×2 IMPLANT
CLSR STERI-STRIP ANTIMIC 1/2X4 (GAUZE/BANDAGES/DRESSINGS) ×1 IMPLANT
COVER SURGICAL LIGHT HANDLE (MISCELLANEOUS) ×2 IMPLANT
DECANTER SPIKE VIAL GLASS SM (MISCELLANEOUS) IMPLANT
DRAPE INCISE IOBAN 66X45 STRL (DRAPES) ×3 IMPLANT
ELECT REM PT RETURN 9FT ADLT (ELECTROSURGICAL) ×2
ELECTRODE REM PT RTRN 9FT ADLT (ELECTROSURGICAL) ×1 IMPLANT
GEL ULTRASOUND 20GR AQUASONIC (MISCELLANEOUS) ×1 IMPLANT
GLOVE BIOGEL M 6.5 STRL (GLOVE) ×2 IMPLANT
GLOVE BIOGEL PI IND STRL 6.5 (GLOVE) IMPLANT
GLOVE BIOGEL PI IND STRL 7.0 (GLOVE) IMPLANT
GLOVE BIOGEL PI INDICATOR 6.5 (GLOVE) ×1
GLOVE BIOGEL PI INDICATOR 7.0 (GLOVE) ×1
GLOVE SS BIOGEL STRL SZ 7.5 (GLOVE) ×1 IMPLANT
GLOVE SUPERSENSE BIOGEL SZ 7.5 (GLOVE) ×1
GOWN STRL NON-REIN LRG LVL3 (GOWN DISPOSABLE) ×6 IMPLANT
GOWN STRL REIN XL XLG (GOWN DISPOSABLE) ×1 IMPLANT
GRAFT GORETEX 6X70 (Vascular Products) ×1 IMPLANT
KIT BASIN OR (CUSTOM PROCEDURE TRAY) ×2 IMPLANT
KIT ROOM TURNOVER OR (KITS) ×2 IMPLANT
NDL HYPO 25GX1X1/2 BEV (NEEDLE) ×1 IMPLANT
NEEDLE HYPO 25GX1X1/2 BEV (NEEDLE) ×2 IMPLANT
NS IRRIG 1000ML POUR BTL (IV SOLUTION) ×2 IMPLANT
PACK CV ACCESS (CUSTOM PROCEDURE TRAY) ×2 IMPLANT
PAD ARMBOARD 7.5X6 YLW CONV (MISCELLANEOUS) ×4 IMPLANT
SPONGE GAUZE 4X4 12PLY (GAUZE/BANDAGES/DRESSINGS) ×2 IMPLANT
STRIP CLOSURE SKIN 1/2X4 (GAUZE/BANDAGES/DRESSINGS) ×2 IMPLANT
SUT PROLENE 6 0 CC (SUTURE) ×4 IMPLANT
SUT SILK 2 0 FS (SUTURE) IMPLANT
SUT VIC AB 2-0 CT1 27 (SUTURE) ×2
SUT VIC AB 2-0 CT1 TAPERPNT 27 (SUTURE) ×1 IMPLANT
SUT VIC AB 3-0 SH 27 (SUTURE) ×4
SUT VIC AB 3-0 SH 27X BRD (SUTURE) ×2 IMPLANT
TAPE CLOTH SURG 4X10 WHT LF (GAUZE/BANDAGES/DRESSINGS) ×1 IMPLANT
TOWEL OR 17X24 6PK STRL BLUE (TOWEL DISPOSABLE) ×2 IMPLANT
TOWEL OR 17X26 10 PK STRL BLUE (TOWEL DISPOSABLE) ×3 IMPLANT
UNDERPAD 30X30 INCONTINENT (UNDERPADS AND DIAPERS) ×2 IMPLANT
WATER STERILE IRR 1000ML POUR (IV SOLUTION) ×2 IMPLANT

## 2013-03-14 NOTE — Progress Notes (Signed)
Nurse called Dr. Krista Blue and informed him of patients elevated blood pressure and his blood sugar being 54. Dr. Krista Blue ordered for patient to have a half ampule of D 50. Will enter orders and administer.

## 2013-03-14 NOTE — Preoperative (Signed)
Beta Blockers   Reason not to administer Beta Blockers:Hold beta blocker due to hypovolemia 

## 2013-03-14 NOTE — Progress Notes (Signed)
CBG:63  Treatment: 15 GM carbohydrate snack  Symptoms: None  Follow-up CBG: Time:1500CBG Result:71 Possible Reasons for Event: Inadequate meal intake  Comments/MD notified:

## 2013-03-14 NOTE — Op Note (Signed)
    OPERATIVE REPORT  DATE OF SURGERY: 03/14/2013  PATIENT: Marcus [name], 57 y.o. male MRN: [id number]  DOB: 22-Jul-1955  PRE-OPERATIVE DIAGNOSIS: End-stage renal disease  POST-OPERATIVE DIAGNOSIS:  Same  PROCEDURE: Left femoral loop AV Gore-Tex graft  SURGEON:  Gretta Began, M.D.  PHYSICIAN ASSISTANT: Rhyne  ANESTHESIA:  Gen.  EBL: Minimal ml  Total I/O In: 200 [I.V.:200] Out: -   BLOOD ADMINISTERED: None  DRAINS: None  SPECIMEN: None  COUNTS CORRECT:  YES  PLAN OF CARE: PACU   PATIENT DISPOSITION:  PACU - hemodynamically stable  PROCEDURE DETAILS: The patient was taken to the operating room placed supine position where the area of the left groin and left leg were prepped and draped in usual sterile fashion. The patient had a prior nonfunctional graft which was recently ligated the 2 bleeding. Incision was made over the prior groin incision carried to isolate the common femoral artery and common femoral vein above the prior AV anastomosis. The artery was of good caliber with minimal atherosclerotic change. The vein was also of good caliber. 2 separate incisions were made distal thigh and a loop configuration tunnel was created and a 6 mm Gore-Tex graft was brought through the tunnel. Common femoral vein was occluded with a partial occlusion clamp was opened anteriorly with an 11 blade and extended longitudinally with Potts scissors. The graft was spatulated and sewn end-to-side to the vein with a running 6-0 Prolene suture. Clamps were removed from the vein. The graft was flushed with heparinized saline and reoccluded. Next the common femoral artery was occluded proximally and distally and was opened 11 blade and sent longitudinally with Potts scissors. A small arteriotomy was created. The graft cut to appropriate length and was sewn end-to-side to the artery with a running 6-0 Prolene suture. Clamps removed and excellent thrill was noted. Wound irrigated with saline.  Hemostasis tablet cautery. Wounds were closed with 3-0 Vicryl in the subcutaneous and subcuticular tissue. Benzoin and Steri-Strips were applied. The patient was transferred to the recovery room in stable condition.   Gretta Began, M.D. 03/14/2013 9:15 AM

## 2013-03-14 NOTE — Anesthesia Postprocedure Evaluation (Signed)
Anesthesia Post Note  Patient: Marcus [name]  Procedure(s) Performed: Procedure(s) (LRB): INSERTION OF  ARTERIOVENOUS (AV) GORE-TEX GRAFT  LEFT THIGH (Left)  Anesthesia type: general  Patient location: PACU  Post pain: Pain level controlled  Post assessment: Patient's Cardiovascular Status Stable  Last Vitals:  Filed Vitals:   03/14/13 1040  BP: 144/63  Pulse: 57  Temp: 36.5 C  Resp: 20    Post vital signs: Reviewed and stable  Level of consciousness: sedated  Complications: No apparent anesthesia complications

## 2013-03-14 NOTE — Interval H&P Note (Signed)
History and Physical Interval Note:  03/14/2013 7:18 AM  Marcus [name]  has presented today for surgery, with the diagnosis of ESRD  The various methods of treatment have been discussed with the patient and family. After consideration of risks, benefits and other options for treatment, the patient has consented to  Procedure(s): INSERTION OF ARTERIOVENOUS (AV) GORE-TEX GRAFT THIGH (Left) as a surgical intervention .  The patient's history has been reviewed, patient examined, no change in status, stable for surgery.  I have reviewed the patient's chart and labs.  Questions were answered to the patient's satisfaction.     Marizol Borror

## 2013-03-14 NOTE — Transfer of Care (Signed)
Immediate Anesthesia Transfer of Care Note  Patient: Marcus [name]  Procedure(s) Performed: Procedure(s): INSERTION OF  ARTERIOVENOUS (AV) GORE-TEX GRAFT  LEFT THIGH (Left)  Patient Location: PACU  Anesthesia Type:General  Level of Consciousness: awake, sedated, patient cooperative and responds to stimulation  Airway & Oxygen Therapy: Patient Spontanous Breathing and Patient connected to nasal cannula oxygen  Post-op Assessment: Report given to PACU RN, Post -op Vital signs reviewed and stable and Patient moving all extremities  Post vital signs: Reviewed and stable  Complications: No apparent anesthesia complications

## 2013-03-14 NOTE — Anesthesia Preprocedure Evaluation (Addendum)
Anesthesia Evaluation  Patient identified by MRN, date of birth, ID band Patient awake    Reviewed: Allergy & Precautions, H&P , NPO status , Patient's Chart, lab work & pertinent test results  History of Anesthesia Complications Negative for: history of anesthetic complications  Airway Mallampati: I TM Distance: >3 FB Neck ROM: Full    Dental  (+) Dental Advisory Given, Edentulous Upper and Edentulous Lower   Pulmonary pneumonia -, Current Smoker,    Pulmonary exam normal       Cardiovascular hypertension, Pt. on home beta blockers + Peripheral Vascular Disease     Neuro/Psych PSYCHIATRIC DISORDERS Depression CVA    GI/Hepatic GERD-  ,  Endo/Other  diabetesHypothyroidism   Renal/GU ESRF and DialysisRenal disease     Musculoskeletal   Abdominal   Peds  Hematology   Anesthesia Other Findings   Reproductive/Obstetrics                         Anesthesia Physical Anesthesia Plan  ASA: IV  Anesthesia Plan: General   Post-op Pain Management:    Induction: Intravenous  Airway Management Planned: LMA  Additional Equipment:   Intra-op Plan:   Post-operative Plan: Extubation in OR  Informed Consent: I have reviewed the patients History and Physical, chart, labs and discussed the procedure including the risks, benefits and alternatives for the proposed anesthesia with the patient or authorized representative who has indicated his/her understanding and acceptance.   Dental advisory given  Plan Discussed with: CRNA, Anesthesiologist and Surgeon  Anesthesia Plan Comments:       Anesthesia Quick Evaluation

## 2013-03-14 NOTE — H&P (View-Only) (Signed)
Patient ID: Marcus [name], male   DOB: 1955-05-04, 57 y.o.   MRN: [id number] Very complex vascular access patient with end-stage renal disease. This is my first evaluation of the patient. We have been asked by Dr. Lowell Guitar to see him again prior to discharge. Patient had been plan to see Dr. Edilia Bo in our office for future access placement planning on December 17. In reviewing his records he did have increased swelling in his right arm with known right innominate vein occlusion. He had a nonfunctional but patent right arm AV graft. This was ligated by Dr. Edilia Bo: 02/28/2013. He did have a functional left femoral loop graft. Apparently on 11/17 he had bleeding from this and apparently had this ligated. I am unable to locate the operative note regarding this case. There was also placement of a right femoral dialysis catheter at the same setting. He has been dialyzed via his right femoral catheter since that time.  On physical exam his right arm does have marked swelling but apparently this is improved. Does have multiple prior left arm graft and the patient reports this was his initial graft. He does have a left femoral loop graft with no evidence of erythema with nicely healing incisions. He has a right femoral dialysis catheter.  I reviewed his prior x-rays. I do not see any evidence that he has anything that would prevent him from having a new left femoral loop graft around his old graft. He does have easily palpable left popliteal pulse but there is no arterial reason not to do this. I would recommend placement of a new left femoral loop graft as there is no evidence of infection in the old ligated the graft. We would recommend this on a nondialysis day. Diffuse up to be medically stable enough for this we could proceed with this while he is an inpatient.

## 2013-03-14 NOTE — Progress Notes (Signed)
Marcus [name] Progress Note  Subjective:   Recent admission for Rt-IJ DVT 11/11 - 11/25. Subsequent bleed from L thigh AVG. These events resulted in ligation of RUE AVG. L thigh AVG, and placement of Rt Fem TDC. Marcus [name] returns today for placement of new L thigh loop graft per Dr. Arbie Cookey.  At the time of this encounter he had just returned from PACU. He is in tears, citing post operative  left thigh/groin pain.  Objective Filed Vitals:   03/14/13 0930 03/14/13 0945 03/14/13 1000 03/14/13 1040  BP: 144/62 150/66 159/75 144/63  Pulse: 57 58 58 57  Temp:   98.8 F (37.1 [name]) 97.7 F (36.5 [name])  TempSrc:    Oral  Resp: 8 9 10 20   Height:      Weight:      SpO2:    100%   Physical Exam General: Sleepy, chronically -ill appearing, obvious discomfort Heart: RRR, no appreciable murmur or rub Lungs: CTA bilat, no wheezes or rhonchi Abdomen: soft, NT, non distended, nl BS Extremities: RUE tender and swollen but much improved since last admit. No pretibial edema Dialysis Access: Rt Fem TDC, new L thigh AVG/ + bruit/ clean dressings  Dialysis Orders: [place] MWF  4:15 hrs 59kg 2K/2.25Ca Heparin 2400 R thigh cath/New L thigh AVG placed 11/28  Hectorol none (pth 56) Epogen15000 Venofer 50mg  per week Recent Labs: Hgb 8.4, Tsat 31% P 4.4, PTH 56    Assessment/Plan: 1. S/p left thigh loop graft - Placed 11/28 per Dr Early  2. ESRD - MWF, K 4.0. HD today. No Heparin. 3. Anemia - Hgb 9.9 on op Epo 15K units. Expect post op drop. Aranesp 60 ordered q Fri.  Recheck CBC and transfuse PRN.  4. Secondary hyperparathyroidism - Ca and Phos controlled. Continue phoslo. Last PTH 56. No op Vit D. 5. HTN/volume - SBPs 140s. Continue home meds. UF 2L as tolerated. 6. Nutrition - Renal diet, multivitamin 7. DM 1 - per primary 8. 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 On lovenox bridge. Warfarin resumed today post-op 9. Goiter - Mgmt per Dr. Gerrit Friends. Not a surgical candidate.  Eating/swallowing better. Pt states able to tolerate regular diet.  [name] Marcus [name] [name] Pager [phone] 03/14/2013,12:30 PM  LOS: 0 days   Renal Attending: I agree with the note above as articulated by Marcus [name]. Carsynn Bethune [name]  Additional Objective Labs: Basic Metabolic Panel:  Recent Labs Lab 03/08/13 0728 03/09/13 0726 03/11/13 0708 03/14/13 0639  NA 133* 132* 132* 141  K 4.7 3.8 4.3 4.0  CL 95* 94* 94*  --   CO2 25 27 25   --   GLUCOSE 102* 135* 164* 54*  BUN 27* 18 28*  --   CREATININE 5.62* 4.11* 5.88*  --   CALCIUM 8.8 9.1 9.1  --   PHOS 4.5 4.1 4.3  --    Liver Function Tests:  Recent Labs Lab 03/08/13 0728 03/09/13 0726 03/11/13 0708  ALBUMIN 2.8* 3.0* 2.8*   CBC:  Recent Labs Lab 03/08/13 0441 03/09/13 0500 03/10/13 0604 03/11/13 0708 03/14/13 0639  WBC 6.0 6.1 7.1 5.9  --   HGB 7.7* 8.7* 8.6* 7.7* 9.9*  HCT 23.9* 26.9* 26.9* 24.0* 29.0*  MCV 94.1 95.4 95.4 95.2  --   PLT 195 244 277 252  --    Blood Culture    Component Value Date/Time   SDES WOUND LEFT THIGH GRAFT 03/03/2013 0246   SDES WOUND LEFT THIGH GRAFT 03/03/2013  0246   SPECREQUEST NONE PT ON VANCOMYCIN 03/03/2013 0246   SPECREQUEST PT ON VANCOMYCIN 03/03/2013 0246   CULT  Value: NO GROWTH 2 DAYS Performed at [facility] 03/03/2013 0246   CULT  Value: NO ANAEROBES ISOLATED Performed at [facility] 03/03/2013 0246   REPTSTATUS 03/05/2013 FINAL 03/03/2013 0246   REPTSTATUS 03/08/2013 FINAL 03/03/2013 0246    CBG:  Recent Labs Lab 03/10/13 1623 03/10/13 2104 03/11/13 1211 03/14/13 0917 03/14/13 1145  GLUCAP 117* 206* 64* 92 63*    Studies/Results: No results found. Medications:   . amLODipine  10 mg Oral QHS  . aspirin EC  81 mg Oral Daily  . calcium acetate  1,334 mg Oral TID WC  . cloNIDine  0.1 mg Oral TID  . docusate sodium  100 mg Oral BID  . [START ON 03/15/2013] enoxaparin  50 mg Subcutaneous Q24H  . FLUoxetine   5 mg Oral q morning - 10a  . insulin aspart  2-3 Units Subcutaneous TID AC  . insulin glargine  5 Units Subcutaneous QHS  . [START ON 03/15/2013] levothyroxine  150 mcg Oral QAC breakfast  . metoprolol  75 mg Oral BID  . pantoprazole  40 mg Oral Daily  . senna  1 tablet Oral BID  . simvastatin  5 mg Oral q1800  . sodium chloride  3 mL Intravenous Q12H  . [START ON 03/15/2013] temazepam  15 mg Oral 4 times weekly  . [START ON 03/15/2013] warfarin  7.5 mg Oral q1800  . Warfarin - Physician Dosing Inpatient   Does not apply [phone]

## 2013-03-15 LAB — CBC
HCT: 24.6 % — ABNORMAL LOW (ref 39.0–52.0)
Hemoglobin: 7.8 g/dL — ABNORMAL LOW (ref 13.0–17.0)
MCH: 31.1 pg (ref 26.0–34.0)
MCHC: 31.7 g/dL (ref 30.0–36.0)
WBC: 7.2 10*3/uL (ref 4.0–10.5)

## 2013-03-15 LAB — GLUCOSE, CAPILLARY
Glucose-Capillary: 36 mg/dL — CL (ref 70–99)
Glucose-Capillary: 81 mg/dL (ref 70–99)

## 2013-03-15 LAB — PROTIME-INR: INR: 1.23 (ref 0.00–1.49)

## 2013-03-15 MED ORDER — MORPHINE SULFATE 2 MG/ML IJ SOLN
INTRAMUSCULAR | Status: AC
  Administered 2013-03-15: 2 mg via INTRAVENOUS
  Filled 2013-03-15: qty 1

## 2013-03-15 MED ORDER — HYDROCODONE-ACETAMINOPHEN 5-325 MG PO TABS
ORAL_TABLET | ORAL | Status: AC
  Filled 2013-03-15: qty 1

## 2013-03-15 NOTE — Progress Notes (Addendum)
[facility] [place]  Subjective  - Doing well left thigh is sore.  On dialysis this am using catheter well.   Objective 134/74 59 98.9 F (37.2 C) (Oral) 14 100%  Intake/Output Summary (Last 24 hours) at 03/15/13 0814 Last data filed at 03/15/13 0528  Gross per 24 hour  Intake    440 ml  Output      0 ml  Net    440 ml    Left thigh soft, palpable PT. No active drainage or hematoma at incision site left thigh  Assessment/Planning: Procedure(s): INSERTION OF  ARTERIOVENOUS (AV) GORE-TEX GRAFT  LEFT THIGH  1 Day Post-OpSurgeon(s): Larina Earthly, MD D/C home after dialysis  F/U PRN     Clinton Gallant [facility] 03/15/2013 8:14 AM --  Laboratory Lab Results:  Recent Labs  03/14/13 0639  HGB 9.9*  HCT 29.0*   BMET  Recent Labs  03/14/13 0639  NA 141  K 4.0  GLUCOSE 54*    COAG Lab Results  Component Value Date   INR 1.23 03/15/2013   INR 1.90* 03/11/2013   INR 1.97* 03/11/2013   No results found for this basename: PTT

## 2013-03-15 NOTE — Procedures (Signed)
Hemodynamically stable with dialysis and reports pain at surgical site. Using femoral catheter. Marcus [name]

## 2013-03-15 NOTE — Progress Notes (Signed)
Hemodialysis-See flowsheet. BP variable during treatment. BP drop into 80s with a decrease in LOC relieved with saline.BP now 169/84. 1 unit PRBCs given for hgb 7.8. Continues to complain of 8/10 pain in leg and winces even when only touching bedsheets. Dr. Lowell Guitar notified since pt is for discharge. Spoke with Dr. Arbie Cookey as well regarding BP. Discharge cancelled, continue to monitor pt.

## 2013-03-15 NOTE — Progress Notes (Signed)
Utilization Review Completed.   Aristotelis Vilardi, RN, BSN Nurse Case Manager  

## 2013-03-15 NOTE — Discharge Summary (Signed)
[facility] Discharge Summary   Patient ID:  Marcus [name] MRN: [id number] DOB/AGE: [birth year] 57 y.o.  Admit date: 03/14/2013 Discharge date: 03/15/2013 Date of Surgery: 03/14/2013 Surgeon: Surgeon(s): Larina Earthly, MD  Admission Diagnosis: ESRD  Discharge Diagnoses:  ESRD  Secondary Diagnoses: Past Medical History  Diagnosis Date  . Hypothyroidism   . Hyperlipidemia   . CVA (cerebrovascular accident)     hx CVA x 2  . GI bleed     2006, mallory-weiss tear  . Pulmonary edema     nov 2011, dry wt decreased  . Goiter     by CT Apr 2013  . Depression   . Cocaine abuse     past history  . Tobacco abuse     history of  . GERD (gastroesophageal reflux disease)   . Cataract   . Insomnia   . Chronic pain   . HTN (hypertension)     sees Dr. Ardelle Park, Rosalita Levan  . Neuromuscular disorder     diabetic neuropathy  . Occlusion of carotid stent     Per patients nursing aid  . ESRD (end stage renal disease) on dialysis     hd - [facility] - [place]  . Pneumonia     hx of  . Diabetes mellitus     Sees Dr. Reather Littler [phone] -- fasting blood 70-110s  . DKA (diabetic ketoacidosis)     severe, Dec 2011  . History of blood transfusion   . On home oxygen therapy     2l nasal cannula at night  . Hemodialysis patient     M, W, F    Procedure(s): INSERTION OF  ARTERIOVENOUS (AV) GORE-TEX GRAFT  LEFT THIGH  Discharged Condition: stable  HPI: Very complex vascular access patient with end-stage renal disease. This is my first evaluation of the patient. We have been asked by Dr. Lowell Guitar to see him again prior to discharge. Patient had been plan to see Dr. Edilia Bo in our office for future access placement planning on December 17. In reviewing his records he did have increased swelling in his right arm with known right innominate vein occlusion. He had a nonfunctional but patent right arm AV graft. This was ligated by Dr. Edilia Bo: 02/28/2013. He did have a functional  left femoral loop graft. Apparently on 11/17 he had bleeding from this and apparently had this ligated. I am unable to locate the operative note regarding this case. There was also placement of a right femoral dialysis catheter at the same setting. He has been dialyzed via his right femoral catheter since that time.  He under went surgery on 03/14/2013 for Left femoral loop AV Gore-Tex graft.  He has a working right femoral catheter for dialysis.  New left graft may be used in 4 weeks.  Discharge home after dialysis today.    Hospital Course:  Marcus [name] is a 57 y.o. male is S/P Left Procedure(s): INSERTION OF  ARTERIOVENOUS (AV) GORE-TEX GRAFT  LEFT THIGH Extubated: POD # 0 Physical exam: Left thigh soft, palpable PT.  No active drainage or hematoma at incision site left thigh  Pt.  voiding and taking PO diet without difficulty. Pt pain controlled with PO pain meds. Labs as below Complications:none  Consults:  Treatment Team:  Lauris Poag, MD  Significant Diagnostic Studies: CBC Lab Results  Component Value Date   WBC 5.9 03/11/2013   HGB 9.9* 03/14/2013   HCT 29.0* 03/14/2013   MCV 95.2 03/11/2013   PLT 252  03/11/2013    BMET    Component Value Date/Time   NA 141 03/14/2013 0639   K 4.0 03/14/2013 0639   CL 94* 03/11/2013 0708   CO2 25 03/11/2013 0708   GLUCOSE 54* 03/14/2013 0639   BUN 28* 03/11/2013 0708   CREATININE 5.88* 03/11/2013 0708   CALCIUM 9.1 03/11/2013 0708   CALCIUM 8.8 05/20/2008 0705   GFRNONAA 10* 03/11/2013 0708   GFRAA 11* 03/11/2013 0708   COAG Lab Results  Component Value Date   INR 1.23 03/15/2013   INR 1.90* 03/11/2013   INR 1.97* 03/11/2013     Disposition:  Discharge to :Home Discharge Orders   Future Appointments Provider Department Dept Phone   04/01/2013 10:45 AM Reather Littler, MD [facility] Endocrinology [phone]   04/02/2013 11:00 AM [facility] [place] [facility] [address] [phone]    04/02/2013 11:30 AM [facility] CARDIOVASCULAR IMAGING [address] [phone]   04/02/2013 12:30 PM Chuck Hint, MD [facility] [phone]   Future Orders Complete By Expires   Call MD for:  redness, tenderness, or signs of infection (pain, swelling, bleeding, redness, odor or green/yellow discharge around incision site)  As directed    Call MD for:  severe or increased pain, loss or decreased feeling  in affected limb(s)  As directed    Call MD for:  temperature >100.5  As directed    Discharge wound care:  As directed    Comments:     Wash with soap and water and pat dry. Keep a dry gauze or washcloth in groin to keep wound dry (to help prevent wound infection).   Driving Restrictions  As directed    Comments:     No driving for 24 hours and while taking pain medication   Lifting restrictions  As directed    Comments:     No lifting for 2 weeks   may wash over wound with mild soap and water  As directed    Nursing communication  As directed    Scheduling Instructions:     Please give paper Rx to patient at discharge.  It is located on the paper chart.   Resume previous diet  As directed        Medication List         amLODipine 10 MG tablet  Commonly known as:  NORVASC  Take 10 mg by mouth at bedtime.     aspirin EC 81 MG tablet  Take 81 mg by mouth daily.     calcium acetate 667 MG capsule  Commonly known as:  PHOSLO  Take 1,334 mg by mouth 3 (three) times daily with meals.     cloNIDine 0.1 MG tablet  Commonly known as:  CATAPRES  Take 0.1 mg by mouth 3 (three) times daily.     docusate sodium 100 MG capsule  Commonly known as:  COLACE  Take 100 mg by mouth 2 (two) times daily.     EASY TOUCH INSULIN SYRINGE 31G X 5/16" 0.5 ML Misc  Generic drug:  Insulin Syringe-Needle U-100     enoxaparin 40 MG/0.4ML injection  Commonly known as:  LOVENOX  Inject 0.5 mLs (50 mg total) into the skin daily. Take 50mg  SQ Daily on  11/26 and 11/27     FLUoxetine 10 MG tablet  Commonly known as:  PROZAC  Take 5 mg by mouth every morning.     HYDROcodone-acetaminophen 5-325 MG per tablet  Commonly known as:  NORCO/VICODIN  Take 1-2 tablets by mouth every 4 (four) hours as needed.     insulin aspart 100 UNIT/ML injection  Commonly known as:  novoLOG  Inject 2-3 Units into the skin 3 (three) times daily before meals. Inject 2 units with breakfast, 3 units before lunch, and 3 units before supper     insulin glargine 100 UNIT/ML injection  Commonly known as:  LANTUS  Inject 0.05 mLs (5 Units total) into the skin at bedtime. Inject 13 units in the morning, and 9 units in the evening.     levothyroxine 112 MCG tablet  Commonly known as:  SYNTHROID, LEVOTHROID  Take 1.5 tablets (168 mcg total) by mouth daily before breakfast.     metoprolol 50 MG tablet  Commonly known as:  LOPRESSOR  Take 75 mg by mouth 2 (two) times daily. Hold morning dose on dialysis days     omeprazole 20 MG capsule  Commonly known as:  PRILOSEC  Take 20 mg by mouth daily.     pravastatin 10 MG tablet  Commonly known as:  PRAVACHOL  Take 10 mg by mouth at bedtime.     PROAIR HFA 108 (90 BASE) MCG/ACT inhaler  Generic drug:  albuterol  Inhale 2 puffs into the lungs every 4 (four) hours as needed for wheezing or shortness of breath.     promethazine 25 MG tablet  Commonly known as:  PHENERGAN  Take 25 mg by mouth every 6 (six) hours as needed. For nausea     sevelamer carbonate 800 MG tablet  Commonly known as:  RENVELA  Take 800 mg by mouth 3 (three) times daily with meals.     temazepam 15 MG capsule  Commonly known as:  RESTORIL  Take 15 mg by mouth 4 (four) times a week. Monday, Wednesday, Friday, saturday     warfarin 7.5 MG tablet  Commonly known as:  COUMADIN  Take 1 tablet (7.5 mg total) by mouth daily. To Start after AVF placed on Friday       Verbal and written Discharge instructions given to the patient. Wound care  per Discharge AVS     Follow-up Information   Schedule an appointment as soon as possible for a visit with Galvin Proffer, MD. (have INR checked Monday, 03/17/13)    Specialty:  Internal Medicine   Contact information:   [address] [place] [address] [phone]     F/U with [facility] PRN  Signed: Clinton Gallant [facility] 03/15/2013, 8:16 AM

## 2013-03-16 LAB — TYPE AND SCREEN
Antibody Screen: NEGATIVE
Unit division: 0

## 2013-03-16 LAB — GLUCOSE, CAPILLARY: Glucose-Capillary: 99 mg/dL (ref 70–99)

## 2013-03-16 MED ORDER — TEMAZEPAM 15 MG PO CAPS: 15.0000 mg | ORAL_CAPSULE | ORAL | Status: DC

## 2013-03-16 NOTE — Progress Notes (Signed)
Patient ID: Marcus [name], male   DOB: [birth year], 57 y.o.   MRN: [id number] The patient's chart was held yesterday due to feeling poorly on hemodialysis yesterday afternoon. Day he is awake and alert with no complaints. He does report some tenderness in his new graft in his left groin. Physical exam reveals excellent healing with no evidence of erythema. He will be discharged home today. And had his catheter removed after use of his new left femoral graft approximately 3 weeks

## 2013-03-16 NOTE — Progress Notes (Signed)
Patient discharged home accompanied by family member. Discharge instructions reviewed with patient and understanding stated. Will follow up as instructed.

## 2013-03-18 ENCOUNTER — Encounter (HOSPITAL_COMMUNITY): Payer: Self-pay | Admitting: Vascular Surgery

## 2013-03-28 ENCOUNTER — Other Ambulatory Visit: Payer: Self-pay | Admitting: *Deleted

## 2013-03-28 DIAGNOSIS — Z01810 Encounter for preprocedural cardiovascular examination: Secondary | ICD-10-CM

## 2013-03-28 DIAGNOSIS — N186 End stage renal disease: Secondary | ICD-10-CM

## 2013-04-01 ENCOUNTER — Encounter: Payer: Self-pay | Admitting: Vascular Surgery

## 2013-04-01 ENCOUNTER — Ambulatory Visit: Payer: Medicare Other | Admitting: Endocrinology

## 2013-04-02 ENCOUNTER — Encounter: Payer: Medicare Other | Admitting: Vascular Surgery

## 2013-04-02 ENCOUNTER — Encounter (HOSPITAL_COMMUNITY): Payer: Medicare Other

## 2013-04-02 ENCOUNTER — Ambulatory Visit (HOSPITAL_COMMUNITY)
Admission: RE | Admit: 2013-04-02 | Discharge: 2013-04-02 | Disposition: A | Discharge status: Home / Self Care | Payer: Medicare Other | Source: Ambulatory Visit | Attending: Vascular Surgery | Admitting: Vascular Surgery

## 2013-04-02 ENCOUNTER — Other Ambulatory Visit (HOSPITAL_COMMUNITY): Payer: Medicare Other

## 2013-04-02 DIAGNOSIS — Z01810 Encounter for preprocedural cardiovascular examination: Secondary | ICD-10-CM

## 2013-04-02 DIAGNOSIS — N186 End stage renal disease: Secondary | ICD-10-CM

## 2013-04-11 NOTE — Telephone Encounter (Signed)
Prescription is still at the front desk and has never been picked up by patient.  Prescription being destroyed at this time.

## 2013-04-15 ENCOUNTER — Inpatient Hospital Stay (HOSPITAL_COMMUNITY)
Admission: AD | Admit: 2013-04-15 | Discharge: 2013-04-22 | DRG: 252 | Disposition: A | Discharge status: Home Health | Payer: Medicare Other | Source: Other Acute Inpatient Hospital | Attending: Internal Medicine | Admitting: Internal Medicine

## 2013-04-15 DIAGNOSIS — I120 Hypertensive chronic kidney disease with stage 5 chronic kidney disease or end stage renal disease: Secondary | ICD-10-CM | POA: Diagnosis present

## 2013-04-15 DIAGNOSIS — E1142 Type 2 diabetes mellitus with diabetic polyneuropathy: Secondary | ICD-10-CM | POA: Diagnosis present

## 2013-04-15 DIAGNOSIS — E1065 Type 1 diabetes mellitus with hyperglycemia: Secondary | ICD-10-CM | POA: Diagnosis present

## 2013-04-15 DIAGNOSIS — E871 Hypo-osmolality and hyponatremia: Secondary | ICD-10-CM

## 2013-04-15 DIAGNOSIS — Z86718 Personal history of other venous thrombosis and embolism: Secondary | ICD-10-CM

## 2013-04-15 DIAGNOSIS — M899 Disorder of bone, unspecified: Secondary | ICD-10-CM | POA: Diagnosis present

## 2013-04-15 DIAGNOSIS — I82A11 Acute embolism and thrombosis of right axillary vein: Secondary | ICD-10-CM | POA: Diagnosis present

## 2013-04-15 DIAGNOSIS — Z992 Dependence on renal dialysis: Secondary | ICD-10-CM

## 2013-04-15 DIAGNOSIS — E785 Hyperlipidemia, unspecified: Secondary | ICD-10-CM | POA: Diagnosis present

## 2013-04-15 DIAGNOSIS — F3289 Other specified depressive episodes: Secondary | ICD-10-CM | POA: Diagnosis present

## 2013-04-15 DIAGNOSIS — F17200 Nicotine dependence, unspecified, uncomplicated: Secondary | ICD-10-CM | POA: Diagnosis present

## 2013-04-15 DIAGNOSIS — E039 Hypothyroidism, unspecified: Secondary | ICD-10-CM

## 2013-04-15 DIAGNOSIS — J811 Chronic pulmonary edema: Secondary | ICD-10-CM

## 2013-04-15 DIAGNOSIS — I82A19 Acute embolism and thrombosis of unspecified axillary vein: Secondary | ICD-10-CM | POA: Diagnosis present

## 2013-04-15 DIAGNOSIS — I82890 Acute embolism and thrombosis of other specified veins: Secondary | ICD-10-CM

## 2013-04-15 DIAGNOSIS — Z7982 Long term (current) use of aspirin: Secondary | ICD-10-CM

## 2013-04-15 DIAGNOSIS — E43 Unspecified severe protein-calorie malnutrition: Secondary | ICD-10-CM

## 2013-04-15 DIAGNOSIS — Z8673 Personal history of transient ischemic attack (TIA), and cerebral infarction without residual deficits: Secondary | ICD-10-CM

## 2013-04-15 DIAGNOSIS — Z880 Allergy status to penicillin: Secondary | ICD-10-CM

## 2013-04-15 DIAGNOSIS — E079 Disorder of thyroid, unspecified: Secondary | ICD-10-CM

## 2013-04-15 DIAGNOSIS — Z681 Body mass index (BMI) 19 or less, adult: Secondary | ICD-10-CM

## 2013-04-15 DIAGNOSIS — N2581 Secondary hyperparathyroidism of renal origin: Secondary | ICD-10-CM | POA: Diagnosis present

## 2013-04-15 DIAGNOSIS — Z9981 Dependence on supplemental oxygen: Secondary | ICD-10-CM

## 2013-04-15 DIAGNOSIS — Z79899 Other long term (current) drug therapy: Secondary | ICD-10-CM

## 2013-04-15 DIAGNOSIS — Z7901 Long term (current) use of anticoagulants: Secondary | ICD-10-CM

## 2013-04-15 DIAGNOSIS — E1029 Type 1 diabetes mellitus with other diabetic kidney complication: Secondary | ICD-10-CM | POA: Diagnosis present

## 2013-04-15 DIAGNOSIS — E119 Type 2 diabetes mellitus without complications: Secondary | ICD-10-CM

## 2013-04-15 DIAGNOSIS — F329 Major depressive disorder, single episode, unspecified: Secondary | ICD-10-CM | POA: Diagnosis present

## 2013-04-15 DIAGNOSIS — Z794 Long term (current) use of insulin: Secondary | ICD-10-CM

## 2013-04-15 DIAGNOSIS — Y838 Other surgical procedures as the cause of abnormal reaction of the patient, or of later complication, without mention of misadventure at the time of the procedure: Secondary | ICD-10-CM | POA: Diagnosis present

## 2013-04-15 DIAGNOSIS — T827XXA Infection and inflammatory reaction due to other cardiac and vascular devices, implants and grafts, initial encounter: Principal | ICD-10-CM

## 2013-04-15 DIAGNOSIS — D649 Anemia, unspecified: Secondary | ICD-10-CM | POA: Diagnosis present

## 2013-04-15 DIAGNOSIS — K219 Gastro-esophageal reflux disease without esophagitis: Secondary | ICD-10-CM | POA: Diagnosis present

## 2013-04-15 DIAGNOSIS — IMO0002 Reserved for concepts with insufficient information to code with codable children: Secondary | ICD-10-CM

## 2013-04-15 DIAGNOSIS — E1049 Type 1 diabetes mellitus with other diabetic neurological complication: Secondary | ICD-10-CM | POA: Diagnosis present

## 2013-04-15 DIAGNOSIS — N186 End stage renal disease: Secondary | ICD-10-CM

## 2013-04-15 DIAGNOSIS — E049 Nontoxic goiter, unspecified: Secondary | ICD-10-CM

## 2013-04-15 DIAGNOSIS — I10 Essential (primary) hypertension: Secondary | ICD-10-CM

## 2013-04-16 ENCOUNTER — Encounter (HOSPITAL_COMMUNITY): Payer: Self-pay

## 2013-04-16 ENCOUNTER — Encounter (HOSPITAL_COMMUNITY)
Admission: AD | Disposition: A | Discharge status: Home Health | Payer: Self-pay | Source: Other Acute Inpatient Hospital | Attending: Internal Medicine

## 2013-04-16 ENCOUNTER — Inpatient Hospital Stay: Admit: 2013-04-16 | Payer: Self-pay | Admitting: Surgery

## 2013-04-16 ENCOUNTER — Encounter (HOSPITAL_COMMUNITY): Payer: Medicare Other | Admitting: Anesthesiology

## 2013-04-16 ENCOUNTER — Inpatient Hospital Stay (HOSPITAL_COMMUNITY): Payer: Medicare Other | Admitting: Anesthesiology

## 2013-04-16 DIAGNOSIS — T82898A Other specified complication of vascular prosthetic devices, implants and grafts, initial encounter: Secondary | ICD-10-CM

## 2013-04-16 DIAGNOSIS — E119 Type 2 diabetes mellitus without complications: Secondary | ICD-10-CM | POA: Diagnosis present

## 2013-04-16 DIAGNOSIS — T827XXA Infection and inflammatory reaction due to other cardiac and vascular devices, implants and grafts, initial encounter: Secondary | ICD-10-CM

## 2013-04-16 HISTORY — PX: PATCH ANGIOPLASTY: SHX6230

## 2013-04-16 HISTORY — PX: AVGG REMOVAL: SHX5153

## 2013-04-16 LAB — PROTIME-INR
INR: 1.67 — ABNORMAL HIGH (ref 0.00–1.49)
Prothrombin Time: 19.2 seconds — ABNORMAL HIGH (ref 11.6–15.2)

## 2013-04-16 LAB — CBC WITH DIFFERENTIAL/PLATELET
Basophils Relative: 0 % (ref 0–1)
Eosinophils Relative: 1 % (ref 0–5)
HCT: 36.5 % — ABNORMAL LOW (ref 39.0–52.0)
Hemoglobin: 11.5 g/dL — ABNORMAL LOW (ref 13.0–17.0)
Lymphs Abs: 1 10*3/uL (ref 0.7–4.0)
MCH: 30.3 pg (ref 26.0–34.0)
MCV: 96.1 fL (ref 78.0–100.0)
Monocytes Absolute: 1.3 10*3/uL — ABNORMAL HIGH (ref 0.1–1.0)
Monocytes Relative: 15 % — ABNORMAL HIGH (ref 3–12)
Neutro Abs: 6.4 10*3/uL (ref 1.7–7.7)
Neutrophils Relative %: 73 % (ref 43–77)
RBC: 3.8 MIL/uL — ABNORMAL LOW (ref 4.22–5.81)

## 2013-04-16 LAB — GLUCOSE, CAPILLARY
Glucose-Capillary: 190 mg/dL — ABNORMAL HIGH (ref 70–99)
Glucose-Capillary: 218 mg/dL — ABNORMAL HIGH (ref 70–99)
Glucose-Capillary: 248 mg/dL — ABNORMAL HIGH (ref 70–99)

## 2013-04-16 LAB — TSH: TSH: 43.833 u[IU]/mL — ABNORMAL HIGH (ref 0.350–4.500)

## 2013-04-16 LAB — SURGICAL PCR SCREEN
MRSA, PCR: NEGATIVE
Staphylococcus aureus: NEGATIVE

## 2013-04-16 LAB — COMPREHENSIVE METABOLIC PANEL
ALT: <5 U/L (ref 0–53)
AST: 9 U/L (ref 0–37)
Albumin: 2.8 g/dL — ABNORMAL LOW (ref 3.5–5.2)
CO2: 26 mEq/L (ref 19–32)
Calcium: 8.4 mg/dL (ref 8.4–10.5)
GFR calc Af Amer: 14 mL/min — ABNORMAL LOW (ref >90)
GFR calc non Af Amer: 12 mL/min — ABNORMAL LOW (ref >90)
Sodium: 133 mEq/L — ABNORMAL LOW (ref 137–147)
Total Protein: 9.2 g/dL — ABNORMAL HIGH (ref 6.0–8.3)

## 2013-04-16 LAB — TYPE AND SCREEN
ABO/RH(D): A POS
Antibody Screen: NEGATIVE

## 2013-04-16 SURGERY — REMOVAL OF ARTERIOVENOUS GORETEX GRAFT (AVGG)
Anesthesia: General | Site: Groin | Laterality: Left

## 2013-04-16 MED ORDER — RENA-VITE PO TABS
1.0000 | ORAL_TABLET | Freq: Every day | ORAL | Status: DC
  Administered 2013-04-16 – 2013-04-21 (×6): 1 via ORAL
  Filled 2013-04-16 (×7): qty 1

## 2013-04-16 MED ORDER — ONDANSETRON HCL 4 MG/2ML IJ SOLN: 4.0000 mg | Freq: Four times a day (QID) | INTRAMUSCULAR | Status: DC | PRN

## 2013-04-16 MED ORDER — PROTAMINE SULFATE 10 MG/ML IV SOLN
INTRAVENOUS | Status: DC | PRN
  Administered 2013-04-16: 40 mg via INTRAVENOUS
  Administered 2013-04-16: 10 mg via INTRAVENOUS

## 2013-04-16 MED ORDER — VANCOMYCIN HCL IN DEXTROSE 1-5 GM/200ML-% IV SOLN: 1000.0000 mg | INTRAVENOUS | Status: DC

## 2013-04-16 MED ORDER — SEVELAMER CARBONATE 800 MG PO TABS
800.0000 mg | ORAL_TABLET | Freq: Three times a day (TID) | ORAL | Status: DC
  Filled 2013-04-16: qty 1

## 2013-04-16 MED ORDER — FENTANYL CITRATE 0.05 MG/ML IJ SOLN
25.0000 ug | INTRAMUSCULAR | Status: DC | PRN
  Administered 2013-04-16 (×4): 25 ug via INTRAVENOUS

## 2013-04-16 MED ORDER — SODIUM CHLORIDE 0.9 % IV SOLN
INTRAVENOUS | Status: DC | PRN
  Administered 2013-04-16: 11:00:00 via INTRAVENOUS

## 2013-04-16 MED ORDER — HEPARIN SODIUM (PORCINE) 1000 UNIT/ML IJ SOLN
INTRAMUSCULAR | Status: DC | PRN
  Administered 2013-04-16: 5000 [IU] via INTRAVENOUS

## 2013-04-16 MED ORDER — HEMOSTATIC AGENTS (NO CHARGE) OPTIME
TOPICAL | Status: DC | PRN
  Administered 2013-04-16: 1 via TOPICAL

## 2013-04-16 MED ORDER — ONDANSETRON HCL 4 MG PO TABS: 4.0000 mg | ORAL_TABLET | Freq: Four times a day (QID) | ORAL | Status: DC | PRN

## 2013-04-16 MED ORDER — VITAMIN K1 10 MG/ML IJ SOLN
5.0000 mg | Freq: Once | INTRAVENOUS | Status: AC
  Administered 2013-04-16: 5 mg via INTRAVENOUS
  Filled 2013-04-16: qty 0.5

## 2013-04-16 MED ORDER — PROPOFOL 10 MG/ML IV BOLUS
INTRAVENOUS | Status: DC | PRN
  Administered 2013-04-16: 150 mg via INTRAVENOUS
  Administered 2013-04-16: 50 mg via INTRAVENOUS

## 2013-04-16 MED ORDER — ASPIRIN EC 81 MG PO TBEC
81.0000 mg | DELAYED_RELEASE_TABLET | Freq: Every day | ORAL | Status: DC
  Administered 2013-04-17 – 2013-04-21 (×5): 81 mg via ORAL
  Filled 2013-04-16 (×5): qty 1

## 2013-04-16 MED ORDER — HYDROMORPHONE HCL PF 1 MG/ML IJ SOLN
0.5000 mg | INTRAMUSCULAR | Status: DC | PRN
  Administered 2013-04-16: 0.5 mg via INTRAVENOUS
  Filled 2013-04-16: qty 1

## 2013-04-16 MED ORDER — DARBEPOETIN ALFA-POLYSORBATE 150 MCG/0.3ML IJ SOLN
150.0000 ug | INTRAMUSCULAR | Status: DC
  Administered 2013-04-18: 150 ug via INTRAVENOUS
  Filled 2013-04-16: qty 0.3

## 2013-04-16 MED ORDER — CLONIDINE HCL 0.1 MG PO TABS
0.1000 mg | ORAL_TABLET | Freq: Three times a day (TID) | ORAL | Status: DC
  Administered 2013-04-16 – 2013-04-17 (×6): 0.1 mg via ORAL
  Filled 2013-04-16 (×10): qty 1

## 2013-04-16 MED ORDER — LIDOCAINE HCL (CARDIAC) 20 MG/ML IV SOLN
INTRAVENOUS | Status: DC | PRN
  Administered 2013-04-16: 40 mg via INTRAVENOUS

## 2013-04-16 MED ORDER — INSULIN ASPART 100 UNIT/ML ~~LOC~~ SOLN
0.0000 [IU] | Freq: Four times a day (QID) | SUBCUTANEOUS | Status: DC
  Administered 2013-04-16: 5 [IU] via SUBCUTANEOUS
  Administered 2013-04-17: 2 [IU] via SUBCUTANEOUS
  Administered 2013-04-17: 5 [IU] via SUBCUTANEOUS
  Administered 2013-04-17: 8 [IU] via SUBCUTANEOUS
  Administered 2013-04-17: 3 [IU] via SUBCUTANEOUS

## 2013-04-16 MED ORDER — ONDANSETRON HCL 4 MG/2ML IJ SOLN
INTRAMUSCULAR | Status: DC | PRN
  Administered 2013-04-16: 4 mg via INTRAVENOUS

## 2013-04-16 MED ORDER — FLUOXETINE HCL 10 MG PO TABS: 5.0000 mg | ORAL_TABLET | Freq: Every morning | ORAL | Status: DC

## 2013-04-16 MED ORDER — HYDROCODONE-ACETAMINOPHEN 5-325 MG PO TABS
1.0000 | ORAL_TABLET | ORAL | Status: DC | PRN
  Administered 2013-04-16 – 2013-04-21 (×10): 2 via ORAL
  Filled 2013-04-16 (×10): qty 2

## 2013-04-16 MED ORDER — FENTANYL CITRATE 0.05 MG/ML IJ SOLN
INTRAMUSCULAR | Status: DC | PRN
  Administered 2013-04-16 (×10): 25 ug via INTRAVENOUS

## 2013-04-16 MED ORDER — VANCOMYCIN HCL 500 MG IV SOLR
500.0000 mg | INTRAVENOUS | Status: DC
  Administered 2013-04-18 – 2013-04-21 (×2): 500 mg via INTRAVENOUS
  Filled 2013-04-16 (×5): qty 500

## 2013-04-16 MED ORDER — LEVOTHYROXINE SODIUM 150 MCG PO TABS
150.0000 ug | ORAL_TABLET | Freq: Every day | ORAL | Status: DC
  Administered 2013-04-16 – 2013-04-17 (×2): 150 ug via ORAL
  Filled 2013-04-16 (×4): qty 1

## 2013-04-16 MED ORDER — FLUOXETINE HCL 20 MG/5ML PO SOLN
5.0000 mg | Freq: Every day | ORAL | Status: DC
  Administered 2013-04-16 – 2013-04-20 (×5): 5 mg via ORAL
  Filled 2013-04-16 (×7): qty 5

## 2013-04-16 MED ORDER — SODIUM CHLORIDE 0.9 % IV SOLN
62.5000 mg | INTRAVENOUS | Status: DC
  Administered 2013-04-18: 62.5 mg via INTRAVENOUS
  Filled 2013-04-16 (×2): qty 5

## 2013-04-16 MED ORDER — METOPROLOL TARTRATE 50 MG PO TABS
75.0000 mg | ORAL_TABLET | Freq: Two times a day (BID) | ORAL | Status: DC
  Administered 2013-04-16: 12:00:00 2 mg via ORAL
  Administered 2013-04-16: 12:00:00 1 mg via ORAL
  Administered 2013-04-17 (×2): 75 mg via ORAL
  Filled 2013-04-16 (×4): qty 1

## 2013-04-16 MED ORDER — CALCIUM ACETATE 667 MG PO CAPS
1334.0000 mg | ORAL_CAPSULE | Freq: Three times a day (TID) | ORAL | Status: DC
  Filled 2013-04-16: qty 2

## 2013-04-16 MED ORDER — VANCOMYCIN HCL IN DEXTROSE 1-5 GM/200ML-% IV SOLN: 1000.0000 mg | Freq: Once | INTRAVENOUS | Status: DC

## 2013-04-16 MED ORDER — 0.9 % SODIUM CHLORIDE (POUR BTL) OPTIME
TOPICAL | Status: DC | PRN
  Administered 2013-04-16: 1000 mL

## 2013-04-16 MED ORDER — INSULIN ASPART 100 UNIT/ML ~~LOC~~ SOLN: 0.0000 [IU] | Freq: Every day | SUBCUTANEOUS | Status: DC

## 2013-04-16 MED ORDER — SODIUM CHLORIDE 0.9 % IJ SOLN
3.0000 mL | Freq: Two times a day (BID) | INTRAMUSCULAR | Status: DC
  Administered 2013-04-17 – 2013-04-21 (×5): 3 mL via INTRAVENOUS

## 2013-04-16 MED ORDER — AMLODIPINE BESYLATE 10 MG PO TABS
10.0000 mg | ORAL_TABLET | Freq: Every day | ORAL | Status: DC
  Administered 2013-04-17: 10 mg via ORAL
  Filled 2013-04-16 (×2): qty 1

## 2013-04-16 MED ORDER — DEXTROSE 5 % IV SOLN
2.0000 g | INTRAVENOUS | Status: DC
  Administered 2013-04-16 – 2013-04-21 (×3): 2 g via INTRAVENOUS
  Filled 2013-04-16 (×7): qty 2

## 2013-04-16 MED ORDER — FENTANYL CITRATE 0.05 MG/ML IJ SOLN
INTRAMUSCULAR | Status: AC
  Filled 2013-04-16: qty 2

## 2013-04-16 MED ORDER — METOCLOPRAMIDE HCL 5 MG/ML IJ SOLN: 10.0000 mg | Freq: Once | INTRAMUSCULAR | Status: DC | PRN

## 2013-04-16 SURGICAL SUPPLY — 57 items
ADH SKN CLS APL DERMABOND .7 (GAUZE/BANDAGES/DRESSINGS) ×1
BANDAGE ELASTIC 4 VELCRO ST LF (GAUZE/BANDAGES/DRESSINGS) IMPLANT
BLADE SURG ROTATE 9660 (MISCELLANEOUS) ×1 IMPLANT
CANISTER SUCTION 2500CC (MISCELLANEOUS) ×2 IMPLANT
CLIP TI MEDIUM 6 (CLIP) ×2 IMPLANT
CLIP TI WIDE RED SMALL 6 (CLIP) ×2 IMPLANT
COVER SURGICAL LIGHT HANDLE (MISCELLANEOUS) ×2 IMPLANT
DECANTER SPIKE VIAL GLASS SM (MISCELLANEOUS) ×2 IMPLANT
DERMABOND ADVANCED (GAUZE/BANDAGES/DRESSINGS) ×1
DERMABOND ADVANCED .7 DNX12 (GAUZE/BANDAGES/DRESSINGS) ×1 IMPLANT
ELECT REM PT RETURN 9FT ADLT (ELECTROSURGICAL) ×2
ELECTRODE REM PT RTRN 9FT ADLT (ELECTROSURGICAL) ×1 IMPLANT
GLOVE BIOGEL PI IND STRL 7.0 (GLOVE) IMPLANT
GLOVE BIOGEL PI IND STRL 7.5 (GLOVE) ×1 IMPLANT
GLOVE BIOGEL PI INDICATOR 7.0 (GLOVE) ×1
GLOVE BIOGEL PI INDICATOR 7.5 (GLOVE) ×1
GLOVE ECLIPSE 7.5 STRL STRAW (GLOVE) ×2 IMPLANT
GLOVE SS BIOGEL STRL SZ 7 (GLOVE) IMPLANT
GLOVE SUPERSENSE BIOGEL SZ 7 (GLOVE) ×1
GLOVE SURG SS PI 7.0 STRL IVOR (GLOVE) ×3 IMPLANT
GLOVE SURG SS PI 7.5 STRL IVOR (GLOVE) ×4 IMPLANT
GOWN PREVENTION PLUS XLARGE (GOWN DISPOSABLE) ×2 IMPLANT
GOWN PREVENTION PLUS XXLARGE (GOWN DISPOSABLE) ×2 IMPLANT
GOWN STRL NON-REIN LRG LVL3 (GOWN DISPOSABLE) ×4 IMPLANT
GOWN STRL REIN XL XLG (GOWN DISPOSABLE) ×1 IMPLANT
HEMOSTAT SNOW SURGICEL 2X4 (HEMOSTASIS) IMPLANT
KIT BASIN OR (CUSTOM PROCEDURE TRAY) ×2 IMPLANT
KIT ROOM TURNOVER OR (KITS) ×2 IMPLANT
NDL HYPO 25GX1X1/2 BEV (NEEDLE) ×1 IMPLANT
NEEDLE HYPO 25GX1X1/2 BEV (NEEDLE) ×2 IMPLANT
NS IRRIG 1000ML POUR BTL (IV SOLUTION) ×2 IMPLANT
PACK CV ACCESS (CUSTOM PROCEDURE TRAY) ×2 IMPLANT
PAD ABD 8X10 STRL (GAUZE/BANDAGES/DRESSINGS) ×2 IMPLANT
PAD ARMBOARD 7.5X6 YLW CONV (MISCELLANEOUS) ×4 IMPLANT
PATCH CORMATRIX 4CMX7CM (Prosthesis & Implant Heart) ×1 IMPLANT
SPONGE GAUZE 4X4 12PLY (GAUZE/BANDAGES/DRESSINGS) ×2 IMPLANT
SPONGE GAUZE 4X4 12PLY STER LF (GAUZE/BANDAGES/DRESSINGS) ×2 IMPLANT
SPONGE INTESTINAL PEANUT (DISPOSABLE) ×1 IMPLANT
SPONGE LAP 18X18 X RAY DECT (DISPOSABLE) ×1 IMPLANT
STAPLER VISISTAT 35W (STAPLE) ×1 IMPLANT
SUT ETHILON 2 0 PSLX (SUTURE) ×1 IMPLANT
SUT ETHILON 3 0 PS 1 (SUTURE) IMPLANT
SUT PROLENE 5 0 C 1 24 (SUTURE) ×2 IMPLANT
SUT PROLENE 6 0 BV (SUTURE) ×2 IMPLANT
SUT SILK 0 TIES 10X30 (SUTURE) ×1 IMPLANT
SUT VIC AB 2-0 CT1 27 (SUTURE) ×4
SUT VIC AB 2-0 CT1 TAPERPNT 27 (SUTURE) IMPLANT
SUT VIC AB 3-0 SH 27 (SUTURE) ×2
SUT VIC AB 3-0 SH 27X BRD (SUTURE) ×1 IMPLANT
SUT VICRYL 4-0 PS2 18IN ABS (SUTURE) ×1 IMPLANT
SWAB COLLECTION DEVICE MRSA (MISCELLANEOUS) ×1 IMPLANT
TAPE CLOTH SURG 4X10 WHT LF (GAUZE/BANDAGES/DRESSINGS) ×2 IMPLANT
TOWEL OR 17X24 6PK STRL BLUE (TOWEL DISPOSABLE) ×2 IMPLANT
TOWEL OR 17X26 10 PK STRL BLUE (TOWEL DISPOSABLE) ×2 IMPLANT
TUBE ANAEROBIC SPECIMEN COL (MISCELLANEOUS) ×1 IMPLANT
UNDERPAD 30X30 INCONTINENT (UNDERPADS AND DIAPERS) ×2 IMPLANT
WATER STERILE IRR 1000ML POUR (IV SOLUTION) ×2 IMPLANT

## 2013-04-16 NOTE — Progress Notes (Signed)
INITIAL NUTRITION ASSESSMENT  DOCUMENTATION CODES Per approved criteria  -Severe malnutrition in the context of chronic illness   INTERVENTION: Recommend Resource Breeze po BID, each supplement provides 250 kcal and 9 grams of protein, once diet order permits.  Recommend 30 ml Prostat po BID, each supplement provides 100 kcal and 15 grams protein, once diet order permits. Recommend Dysphagia 3 textures once diet order permits. RD to continue to follow nutrition care plan.  NUTRITION DIAGNOSIS: Inadequate oral intake related to inability to eat as evidenced by NPO status.   Goal: Intake to meet >90% of estimated nutrition needs.  Monitor:  weight trends, lab trends, I/O's, diet advancement  Reason for Assessment: Malnutrition Screening Tool  57 y.o. male  Admitting Dx: Vascular graft infection  ASSESSMENT: PMHx significant for ESRD (HD on MWF), CVA x 2, cocaine abuse. Admitted with leg pain. Work-up reveals infected fluid collection around AV graft. Currently in OR.  Pt was hospitalized last month, SLP saw and recommend Dysphagia 3 diet. Was drinking Resource Breeze BID during that admission and enjoyed it. Pt was dx with severe malnutrition during that time 2/2 severe muscle depletion and suspected intake of <75% x at least 1 month. Pt has an additional 8 lb since last admission (6% x 1 month), will continue severe malnutrition dx at this time.  Height: Ht Readings from Last 1 Encounters:  04/15/13 6\' 2"  (1.88 m)    Weight: Wt Readings from Last 1 Encounters:  04/15/13 129 lb 8 oz (58.741 kg)    Ideal Body Weight: 190 lb  % Ideal Body Weight: 68%  Wt Readings from Last 10 Encounters:  04/15/13 129 lb 8 oz (58.741 kg)  04/15/13 129 lb 8 oz (58.741 kg)  03/15/13 137 lb 12.6 oz (62.5 kg)  03/15/13 137 lb 12.6 oz (62.5 kg)  03/11/13 136 lb 11 oz (62 kg)  03/11/13 136 lb 11 oz (62 kg)  03/11/13 136 lb 11 oz (62 kg)  02/20/13 140 lb (63.504 kg)  01/29/13 133 lb 2.5 oz  (60.4 kg)  01/29/13 133 lb 2.5 oz (60.4 kg)    Usual Body Weight: 135 - 145 lb  % Usual Body Weight: 94%  BMI:  Body mass index is 16.62 kg/(m^2). Underweight  Estimated Nutritional Needs: Kcal: 1800 - 2000 Protein: 75 - 90 g Fluid: 2 liters  Skin: blister to R foot  Diet Order: NPO  EDUCATION NEEDS: -No education needs identified at this time  No intake or output data in the 24 hours ending 04/16/13 1206  Last BM: 12/30  Labs:   Recent Labs Lab 04/16/13 0141  NA 133*  K 3.7  CL 91*  CO2 26  BUN 22  CREATININE 4.83*  CALCIUM 8.4  PHOS 2.3  GLUCOSE 202*    CBG (last 3)   Recent Labs  04/15/13 2304 04/16/13 0628  GLUCAP 214* 190*    Scheduled Meds: . [MAR HOLD] amLODipine  10 mg Oral QHS  . [facility] HOLD] aspirin EC  81 mg Oral Daily  . [facility] HOLD] calcium acetate  1,334 mg Oral TID WC  . [MAR HOLD] ceFEPime (MAXIPIME) IV  2 g Intravenous Q M,W,F-HD  . [facility] HOLD] cloNIDine  0.1 mg Oral TID  . [MAR HOLD] FLUoxetine  5 mg Oral Daily  . [MAR HOLD] insulin aspart  0-15 Units Subcutaneous Q6H  . [MAR HOLD] insulin aspart  0-5 Units Subcutaneous QHS  . [MAR HOLD] levothyroxine  150 mcg Oral QAC breakfast  . [MAR HOLD]  metoprolol  75 mg Oral BID  . [facility] HOLD] sevelamer carbonate  800 mg Oral TID WC  . [MAR HOLD] sodium chloride  3 mL Intravenous Q12H  . [facility] HOLD] vancomycin  500 mg Intravenous Q M,W,F-HD  . vancomycin  1,000 mg Intravenous Once    Continuous Infusions:   Past Medical History  Diagnosis Date  . Hypothyroidism   . Hyperlipidemia   . CVA (cerebrovascular accident)     hx CVA x 2  . GI bleed     2006, mallory-weiss tear  . Pulmonary edema     nov 2011, dry wt decreased  . Goiter     by CT Apr 2013  . Depression   . Cocaine abuse     past history  . Tobacco abuse     history of  . GERD (gastroesophageal reflux disease)   . Cataract   . Insomnia   . Chronic pain   . HTN (hypertension)     sees Dr. Ardelle Park, Rosalita Levan  .  Neuromuscular disorder     diabetic neuropathy  . Occlusion of carotid stent     Per patients nursing aid  . ESRD (end stage renal disease) on dialysis     hd - [facility] - [place]  . Pneumonia     hx of  . Diabetes mellitus     Sees Dr. Reather Littler [phone] -- fasting blood 70-110s  . DKA (diabetic ketoacidosis)     severe, Dec 2011  . History of blood transfusion   . On home oxygen therapy     2l nasal cannula at night  . Hemodialysis patient     M, W, F    Past Surgical History  Procedure Laterality Date  . Av fistula placement  03/25/2008    right forearm  . Av fistula placement  04/23/2012    Procedure: INSERTION OF ARTERIOVENOUS (AV) GORE-TEX GRAFT THIGH;  Surgeon: Sherren Kerns, MD;  Location[facility] OR;  Service: Vascular;  Laterality: Left;  . Arteriovenous graft placement Left     thigh  . Carotid stent insertion Right   . Dyalisis catheter    . Eye surgery Bilateral     cataracts  . Thyroid surgery  01/28/2013    Dr Gerrit Friends  . Lymph node biopsy Right 01/28/2013    Procedure: LYMPH NODE BIOPSY;  Surgeon: Velora Heckler, MD;  Location: [facility] OR;  Service: General;  Laterality: Right;  . Mass biopsy N/A 01/28/2013    Procedure: NECK EXPLORATION;  Surgeon: Velora Heckler, MD;  Location: [facility] OR;  Service: General;  Laterality: N/A;  . Ligation arteriovenous gortex graft Right 02/27/2013    Procedure: LIGATION ARTERIOVENOUS GORTEX GRAFT;  Surgeon: Chuck Hint, MD;  Location: [facility] OR;  Service: Vascular;  Laterality: Right;  . Ligation arteriovenous gortex graft Left 03/03/2013    Procedure: LIGATION Left ARTERIOVENOUS GORTEX Thigh GRAFT;  Surgeon: Fransisco Hertz, MD;  Location: [facility] OR;  Service: Vascular;  Laterality: Left;  . Insertion of dialysis catheter Right 03/03/2013    Procedure: INSERTION OF DIALYSIS CATHETER;  Surgeon: Fransisco Hertz, MD;  Location: [facility] OR;  Service: Vascular;  Laterality: Right;  . Av fistula placement Left 03/14/2013    Procedure: INSERTION OF   ARTERIOVENOUS (AV) GORE-TEX GRAFT  LEFT THIGH;  Surgeon: Larina Earthly, MD;  Location: [facility] OR;  Service: Vascular;  Laterality: Left;    Jarold Motto MS, RD, LDN Pager: [phone] After-hours pager: [phone]

## 2013-04-16 NOTE — Anesthesia Procedure Notes (Signed)
Procedure Name: Intubation Date/Time: 04/16/2013 11:30 AM Performed by: Armandina Gemma Pre-anesthesia Checklist: Patient identified, Emergency Drugs available, Suction available, Patient being monitored and Timeout performed Patient Re-evaluated:Patient Re-evaluated prior to inductionOxygen Delivery Method: Circle system utilized Preoxygenation: Pre-oxygenation with 100% oxygen Intubation Type: IV induction Ventilation: Mask ventilation without difficulty LMA: LMA with gastric port inserted LMA Size: 5.0 Tube type: Oral Number of attempts: 1 Comments: IV induction Fredrick- LMA AM CRNA- atraumatic mouth as preop- bilat BS Justin Mend

## 2013-04-16 NOTE — Progress Notes (Signed)
ANTICOAGULATION CONSULT NOTE - Follow-Up Consult  Pharmacy Consult for Heparin Indication: DVT  Allergies  Allergen Reactions  . Penicillins Other (See Comments)    Unknown reaction    Patient Measurements: Height: 6\' 2"  (188 cm) Weight: 129 lb 8 oz (58.741 kg) IBW/kg (Calculated) : 82.2  Vital Signs: Temp: 97.8 F (36.6 C) (12/31 1505) Temp src: Oral (12/31 1505) BP: 135/66 mmHg (12/31 1505) Pulse Rate: 85 (12/31 1505)  Labs:  Recent Labs  04/16/13 0141 04/16/13 0945  HGB 11.5*  --   HCT 36.5*  --   PLT 269  --   APTT 67*  --   LABPROT 33.9* 19.2*  INR 3.51* 1.67*  CREATININE 4.83*  --     Estimated Creatinine Clearance: 14 ml/min (by C-G formula based on Cr of 4.83).  Assessment: 57 yo male with h/o DVT, Coumadin on hold for OR, for heparin.  Vitamin K 5 mg IV ordered.  INR decreased from 3.51 this AM down to 1.67 with vitamin K.  Now s/p OR for removal of left thigh infected dialysis graft.  Spoke with Dr. Myra Gianotti post-op, and he would prefer patient not receive any anticoagulation tonight.  He asked that we f/u with primary team in the morning.  Goal of Therapy:  Heparin level 0.3-0.7 units/ml Monitor platelets by anticoagulation protocol: Yes   Plan:  No heparin or coumadin for tonight. Will f/u plans to resume in the AM 1/1.  Tad Moore, BCPS  Clinical Pharmacist Pager [phone]  04/16/2013 3:27 PM

## 2013-04-16 NOTE — Progress Notes (Signed)
ANTIBIOTIC CONSULT NOTE - INITIAL  Pharmacy Consult for Vancomycin and Cefepime Indication: infected AVG  Allergies  Allergen Reactions  . Penicillins Other (See Comments)    Unknown reaction    Patient Measurements: Weight: 129 lb 8 oz (58.741 kg)  Vital Signs: Temp: 99.6 F (37.6 C) (12/30 2250) Temp src: Oral (12/30 2250) BP: 133/66 mmHg (12/30 2250) Pulse Rate: 83 (12/30 2250) Intake/Output from previous day:   Intake/Output from this shift:    Labs (at [facility]): WBC 8.3 Hgb 11.6 Hct 36.1 Plt 241  SCr 4.3  INR  4.5 No results found for this basename: WBC, HGB, PLT, LABCREA, CREATININE,  in the last 72 hours The CrCl is unknown because both a height and weight (above a minimum accepted value) are required for this calculation. No results found for this basename: VANCOTROUGH, VANCOPEAK, VANCORANDOM, GENTTROUGH, GENTPEAK, GENTRANDOM, TOBRATROUGH, TOBRAPEAK, TOBRARND, AMIKACINPEAK, AMIKACINTROU, AMIKACIN,  in the last 72 hours   Microbiology: No results found for this or any previous visit (from the past 720 hour(s)).  Medical History: Past Medical History  Diagnosis Date  . Hypothyroidism   . Hyperlipidemia   . CVA (cerebrovascular accident)     hx CVA x 2  . GI bleed     2006, mallory-weiss tear  . Pulmonary edema     nov 2011, dry wt decreased  . Goiter     by CT Apr 2013  . Depression   . Cocaine abuse     past history  . Tobacco abuse     history of  . GERD (gastroesophageal reflux disease)   . Cataract   . Insomnia   . Chronic pain   . HTN (hypertension)     sees Dr. Ardelle Park, Rosalita Levan  . Neuromuscular disorder     diabetic neuropathy  . Occlusion of carotid stent     Per patients nursing aid  . ESRD (end stage renal disease) on dialysis     hd - [facility] - [place]  . Pneumonia     hx of  . Diabetes mellitus     Sees Dr. Reather Littler [phone] -- fasting blood 70-110s  . DKA (diabetic ketoacidosis)     severe, Dec 2011  . History  of blood transfusion   . On home oxygen therapy     2l nasal cannula at night  . Hemodialysis patient     M, W, F    Medications:  Norvasc  ASA  PhosLo  Clonidine  Colace  Prozac  Vicodin  Novolog  Lantus  Synthroid  Lopressor  Prilosec  Pravachol  Phenergan  Renvela  Restoril  Coumadin  Assessment: 57 yo male with infected HD shunt for empiric antibiotics.  Vancomycin 1 g IV given at  [facility] ~ 10 pm  Goal of Therapy:  Vancomycin Pre HD level 15-25  Plan:  Cefepime 2 g IV now and after qHD Vancomycin 500 mg IV after qHD  Ogle Hoeffner, [name] 04/16/2013,12:34 AM

## 2013-04-16 NOTE — Transfer of Care (Signed)
Immediate Anesthesia Transfer of Care Note  Patient: Marcus [name]  Procedure(s) Performed: Procedure(s): REMOVAL OF ARTERIOVENOUS GORETEX GRAFT Left Thigh (Left) Patch Angioplasty of left femoral artery (Left)  Patient Location: PACU  Anesthesia Type:General  Level of Consciousness: sedated and patient cooperative  Airway & Oxygen Therapy: Patient Spontanous Breathing and Patient connected to face mask oxygen  Post-op Assessment: Report given to PACU RN and Post -op Vital signs reviewed and stable  Post vital signs: Reviewed and stable  Complications: No apparent anesthesia complications

## 2013-04-16 NOTE — Consult Note (Signed)
[facility] Renal Consultation Note  Indication for Consultation:  Management of ESRD/hemodialysis; anemia, hypertension/volume and secondary hyperparathyroidism  HPI: Marcus [name] is a 57 y.o. male admitted last night with  Left Femoral AVGG infection.He is currently in the recovery room sp removal of infected Left Femoral AVGG not able to give history.  Records show he presented to [facility] last night with a 3 day history of worsening pain in his left groin. He denies fevers/ chills ,cough, chest pain, palpitation, shortness of breath, orthopnea, PND, nausea, vomiting, abdominal pain, diarrhea, active bleeding, dizziness, pedal edema,and was seen at op HD yesterday [facility] by Dr. Eliott Nine without cos. A CT Scan  was done that showed an infected fluid collection around the graft in his left groin.  By history=  March 03 2013 he presented with bleeding from a left thigh graft. There was a seroma adjacent to the arterial limb of the graft with associated fluid. The graft was ligated and a right femoral catheter placed. This was done by Dr. Imogene Burn. Approximately 10 days later, Dr. Arbie Cookey placed a new left femoral loop graft. He continues to dialyze through his catheter. He reports no issues of bleeding from his graft.He has had recent [facility] dc 11/11 to 11/25/ 14 with Right upper ext DVT and R Jugular vein Thrombus dc on coumadin followed by [facility] [place] area for coumadin management. His Right upper extremity massive swelling has now resolved since that admit   Past Medical History  Diagnosis Date  . Hypothyroidism   . Hyperlipidemia   . CVA (cerebrovascular accident)     hx CVA x 2  . GI bleed     2006, mallory-weiss tear  . Pulmonary edema     nov 2011, dry wt decreased  . Goiter     by CT Apr 2013  . Depression   . Cocaine abuse     past history  . Tobacco abuse     history of  . GERD (gastroesophageal reflux disease)   . Cataract    . Insomnia   . Chronic pain   . HTN (hypertension)     sees Dr. Ardelle Park, Rosalita Levan  . Neuromuscular disorder     diabetic neuropathy  . Occlusion of carotid stent     Per patients nursing aid  . ESRD (end stage renal disease) on dialysis     hd - [facility] - [place]  . Pneumonia     hx of  . Diabetes mellitus     Sees Dr. Reather Littler [phone] -- fasting blood 70-110s  . DKA (diabetic ketoacidosis)     severe, Dec 2011  . History of blood transfusion   . On home oxygen therapy     2l nasal cannula at night  . Hemodialysis patient     M, W, F    Past Surgical History  Procedure Laterality Date  . Av fistula placement  03/25/2008    right forearm  . Av fistula placement  04/23/2012    Procedure: INSERTION OF ARTERIOVENOUS (AV) GORE-TEX GRAFT THIGH;  Surgeon: Sherren Kerns, MD;  Location[facility] OR;  Service: Vascular;  Laterality: Left;  . Arteriovenous graft placement Left     thigh  . Carotid stent insertion Right   . Dyalisis catheter    . Eye surgery Bilateral     cataracts  . Thyroid surgery  01/28/2013    Dr Gerrit Friends  . Lymph node biopsy Right 01/28/2013    Procedure: LYMPH  NODE BIOPSY;  Surgeon: Velora Heckler, MD;  Location: [facility] OR;  Service: General;  Laterality: Right;  . Mass biopsy N/A 01/28/2013    Procedure: NECK EXPLORATION;  Surgeon: Velora Heckler, MD;  Location: [facility] OR;  Service: General;  Laterality: N/A;  . Ligation arteriovenous gortex graft Right 02/27/2013    Procedure: LIGATION ARTERIOVENOUS GORTEX GRAFT;  Surgeon: Chuck Hint, MD;  Location: [facility] OR;  Service: Vascular;  Laterality: Right;  . Ligation arteriovenous gortex graft Left 03/03/2013    Procedure: LIGATION Left ARTERIOVENOUS GORTEX Thigh GRAFT;  Surgeon: Fransisco Hertz, MD;  Location: [facility] OR;  Service: Vascular;  Laterality: Left;  . Insertion of dialysis catheter Right 03/03/2013    Procedure: INSERTION OF DIALYSIS CATHETER;  Surgeon: Fransisco Hertz, MD;  Location: [facility] OR;  Service: Vascular;   Laterality: Right;  . Av fistula placement Left 03/14/2013    Procedure: INSERTION OF  ARTERIOVENOUS (AV) GORE-TEX GRAFT  LEFT THIGH;  Surgeon: Larina Earthly, MD;  Location: [facility] OR;  Service: Vascular;  Laterality: Left;     No family history on file.    reports that he has been smoking Cigarettes.  He has been smoking about 0.25 packs per day. He has never used smokeless tobacco. He reports that he does not drink alcohol or use illicit drugs.   Allergies  Allergen Reactions  . Penicillins Other (See Comments)    Unknown reaction    Prior to Admission medications   Medication Sig Start Date End Date Taking? Authorizing Provider  amLODipine (NORVASC) 10 MG tablet Take 10 mg by mouth at bedtime.    Yes Historical Provider, MD  aspirin EC 81 MG tablet Take 81 mg by mouth daily.   Yes Historical Provider, MD  calcium acetate (PHOSLO) 667 MG capsule Take 1,334 mg by mouth 3 (three) times daily with meals.   Yes Historical Provider, MD  cloNIDine (CATAPRES) 0.1 MG tablet Take 0.1 mg by mouth 3 (three) times daily.   Yes Historical Provider, MD  docusate sodium (COLACE) 100 MG capsule Take 100 mg by mouth 2 (two) times daily.    Yes Historical Provider, MD  FLUoxetine (PROZAC) 10 MG tablet Take 5 mg by mouth every morning.    Yes Historical Provider, MD  insulin aspart (NOVOLOG) 100 UNIT/ML injection Inject 2-3 Units into the skin 3 (three) times daily before meals. Inject 2 units with breakfast, 3 units before lunch, and 3 units before supper 03/11/13  Yes Zannie Cove, MD  insulin glargine (LANTUS) 100 UNIT/ML injection Inject 0.05 mLs (5 Units total) into the skin at bedtime. Inject 13 units in the morning, and 9 units in the evening. 03/11/13  Yes Zannie Cove, MD  levothyroxine (SYNTHROID, LEVOTHROID) 112 MCG tablet Take 224 mcg by mouth daily before breakfast.   Yes Historical Provider, MD  metoprolol (LOPRESSOR) 50 MG tablet Take 75 mg by mouth 2 (two) times daily. Hold morning dose on  dialysis days   Yes Historical Provider, MD  omeprazole (PRILOSEC) 20 MG capsule Take 20 mg by mouth daily.   Yes Historical Provider, MD  pravastatin (PRAVACHOL) 10 MG tablet Take 10 mg by mouth at bedtime.   Yes Historical Provider, MD  PROAIR HFA 108 (90 BASE) MCG/ACT inhaler Inhale 2 puffs into the lungs every 4 (four) hours as needed for wheezing or shortness of breath.  12/08/12  Yes Historical Provider, MD  promethazine (PHENERGAN) 25 MG tablet Take 25 mg by mouth every 6 (six) hours as  needed. For nausea   Yes Historical Provider, MD  sevelamer carbonate (RENVELA) 800 MG tablet Take 800 mg by mouth 3 (three) times daily with meals.  10/23/12  Yes Historical Provider, MD  temazepam (RESTORIL) 15 MG capsule Take 15 mg by mouth 4 (four) times a week. Monday, Wednesday, Friday, saturday   Yes Historical Provider, MD  warfarin (COUMADIN) 7.5 MG tablet Take 1 tablet (7.5 mg total) by mouth daily. To Start after AVF placed on Friday 03/14/13  Yes Zannie Cove, MD  EASY [facility] INSULIN SYRINGE 31G X 5/16" 0.5 ML MISC  12/19/12   Historical Provider, MD    ZOX:[id number], HYDROmorphone (DILAUDID) injection, ondansetron (ZOFRAN) IV, ondansetron  Results for orders placed during the hospital encounter of 04/15/13 (from the past 48 hour(s))  SURGICAL PCR SCREEN     Status: None   Collection Time    04/16/13 12:11 AM      Result Value Range   MRSA, PCR NEGATIVE  NEGATIVE   Staphylococcus aureus NEGATIVE  NEGATIVE   Comment:            The Xpert SA Assay (FDA     approved for NASAL specimens     in patients over 23 years of age),     is one component of     a comprehensive surveillance     program.  Test performance has     been validated by [employer] for patients greater     than or equal to 62 year old.     It is not intended     to diagnose infection nor to     guide or monitor treatment.  COMPREHENSIVE METABOLIC PANEL     Status: Abnormal   Collection Time    04/16/13   1:41 AM      Result Value Range   Sodium 133 (*) 137 - 147 mEq/L   Comment: Please note change in reference range.   Potassium 3.7  3.7 - 5.3 mEq/L   Comment: Please note change in reference range.   Chloride 91 (*) 96 - 112 mEq/L   CO2 26  19 - 32 mEq/L   Glucose, Bld 202 (*) 70 - 99 mg/dL   BUN 22  6 - 23 mg/dL   Creatinine, Ser 0.86 (*) 0.50 - 1.35 mg/dL   Calcium 8.4  8.4 - 57.8 mg/dL   Total Protein 9.2 (*) 6.0 - 8.3 g/dL   Albumin 2.8 (*) 3.5 - 5.2 g/dL   AST 9  0 - 37 U/L   ALT <5  0 - 53 U/L   Alkaline Phosphatase 95  39 - 117 U/L   Total Bilirubin 0.3  0.3 - 1.2 mg/dL   GFR calc non Af Amer 12 (*) >90 mL/min   GFR calc Af Amer 14 (*) >90 mL/min   Comment: (NOTE)     The eGFR has been calculated using the CKD EPI equation.     This calculation has not been validated in all clinical situations.     eGFR's persistently <90 mL/min signify possible Chronic Kidney     Disease.  PHOSPHORUS     Status: None   Collection Time    04/16/13  1:41 AM      Result Value Range   Phosphorus 2.3  2.3 - 4.6 mg/dL  CBC WITH DIFFERENTIAL     Status: Abnormal   Collection Time    04/16/13  1:41 AM  Result Value Range   WBC 8.8  4.0 - 10.5 K/uL   RBC 3.80 (*) 4.22 - 5.81 MIL/uL   Hemoglobin 11.5 (*) 13.0 - 17.0 g/dL   HCT 08.6 (*) 57.8 - 46.9 %   MCV 96.1  78.0 - 100.0 fL   MCH 30.3  26.0 - 34.0 pg   MCHC 31.5  30.0 - 36.0 g/dL   RDW 62.9 (*) 52.8 - 41.3 %   Platelets 269  150 - 400 K/uL   Comment: PLATELET COUNT CONFIRMED BY SMEAR   Neutrophils Relative % 73  43 - 77 %   Lymphocytes Relative 11 (*) 12 - 46 %   Monocytes Relative 15 (*) 3 - 12 %   Eosinophils Relative 1  0 - 5 %   Basophils Relative 0  0 - 1 %   Neutro Abs 6.4  1.7 - 7.7 K/uL   Lymphs Abs 1.0  0.7 - 4.0 K/uL   Monocytes Absolute 1.3 (*) 0.1 - 1.0 K/uL   Eosinophils Absolute 0.1  0.0 - 0.7 K/uL   Basophils Absolute 0.0  0.0 - 0.1 K/uL  PROTIME-INR     Status: Abnormal   Collection Time    04/16/13  1:41  AM      Result Value Range   Prothrombin Time 33.9 (*) 11.6 - 15.2 seconds   INR 3.51 (*) 0.00 - 1.49  APTT     Status: Abnormal   Collection Time    04/16/13  1:41 AM      Result Value Range   aPTT 67 (*) 24 - 37 seconds   Comment:            IF BASELINE aPTT IS ELEVATED,     SUGGEST PATIENT RISK ASSESSMENT     BE USED TO DETERMINE APPROPRIATE     ANTICOAGULANT THERAPY.  TYPE AND SCREEN     Status: None   Collection Time    04/16/13  1:41 AM      Result Value Range   ABO/RH(D) A POS     Antibody Screen NEG     Sample Expiration 04/19/2013    GLUCOSE, CAPILLARY     Status: Abnormal   Collection Time    04/16/13  6:28 AM      Result Value Range   Glucose-Capillary 190 (*) 70 - 99 mg/dL    ROS: only pos itives in hpi   Physical Exam: Filed Vitals:   04/16/13 0922  BP: 136/65  Pulse: 81  Temp: 99.5 F (37.5 C)  Resp: 20     General:Alert. Thin chronically ill BM in the recovery room/ NAD/ post op lethargy HEENT: [place], MMM Eyes:  nonicteric Neck: No jvd Heart: RRR, no rub or mur Lungs: CTA  Abdomen: bs pos. , soft , nontender Extremities:  No pedal edema Skin: very dry scaly skin bilat. Feet , no overt  ulcer or rash/Left fem area with dry,/clean surgical bandage Neuro: appropriate post op lethargic in recovery room,( knows he is at hosp. For avgg infection) Dialysis Access: Right Fem perm cath  Dialysis Orders: Center: [facility] on MWF ( Holiday schedule= .Sun Tue  FRI) EDW 60.0  HD Bath 2.0 k, 2.25 ca  Time 4hr Heparin 2400. Access R fem Perm cath / LFem Avgg 03/14/13 insert  BFR 400 DFR 800    Zemplar 0 mcg IV/HD Epogen 18000   Units IV/HD  Venofer 50mg   Weekly hd  Other  Labs op= 56 pth, hgb 10.4<9.8  TFS 38%  Assessment/Plan 1. Left Fem. AVGG infected- Dr. Myra Gianotti removal today and on IV vanco and Maxipime  2. ESRD - schedule MWF ([place]) holiday schedule= sun ,tue, fri/ k=3.7 / using r fem perm cath. 3. Hypertension/volume  - stable post op/ on  Clonidine0.1mg  tid/lopressor 75mg  bid// norvsac 10mg  / ith HD fu bp ? Taper back meds if  Sig . Drop  On hd Below dry wt 4. Anemia  - continue ESA/ iron weekly 5. Metabolic bone disease -  No vit . Hold renvela binder with phos 2.3 6. Ho RUE DVT( axillary and jugular vein) - was on Coumadin  With admit INR 3.51 7. DM type 1 - per admit team. 8. Ho CVA 9. HO Lymphocytic Thyroiditis  Lenny Pastel, PA-C [facility] Beeper [phone] 04/16/2013, 9:30 AM   I have seen and examined patient, discussed with PA and agree with assessment and plan as outlined above.  57 yo ESRD pt with multiple medical problems and advanced access issues admitted with infected L thigh AVG which was removed today.  He will continue to dialyze through the R groin catheter for the time being.  Cultures are pending, on empiric abx. He has had a lot of problems maintaining access.  Plan next HD Friday.   [name] MD pager [phone]    cell [phone] 04/16/2013, 5:06 PM      mwsat)

## 2013-04-16 NOTE — Progress Notes (Signed)
Patient seen earlier today by my colleague Dr. Allena Katz. Data base, click, chart reviewed. Patient was still in the CVOR. Check labs for a.m. call if has any questions.   Clint Lipps, MD [facility] Pager: [phone] 04/16/2013, 3:07 PM

## 2013-04-16 NOTE — Consult Note (Signed)
Consult Note  Patient name: Marcus [name] MRN: [id number] DOB: October 07, 1955 Sex: male  Consulting Physician:   renal  Reason for Consult: No chief complaint on file.   HISTORY OF PRESENT ILLNESS: The patient presented to [facility] last night with a 3 day history of worsening pain in his left groin.  He denies fevers or chills.  A CT scan was performed that showed an infected fluid collection around the graft in his left groin.  On November 13 of this year he presented with bleeding from a left thigh graft.  There was a seroma adjacent to the arterial limb of the graft with associated fluid.  The graft was ligated and a right femoral catheter placed.  This was done by Dr. Imogene Burn.  Approximately 10 days later, Dr. early placed a new left femoral loop graft.  He continues to dialyze through his catheter.  He reports no issues of bleeding from his graft.  The patient has a history of stroke x2.  He is medically managed hyperlipidemia with a statin.  He is also treated for diabetes which is been under relatively good control.  Past Medical History  Diagnosis Date  . Hypothyroidism   . Hyperlipidemia   . CVA (cerebrovascular accident)     hx CVA x 2  . GI bleed     2006, mallory-weiss tear  . Pulmonary edema     nov 2011, dry wt decreased  . Goiter     by CT Apr 2013  . Depression   . Cocaine abuse     past history  . Tobacco abuse     history of  . GERD (gastroesophageal reflux disease)   . Cataract   . Insomnia   . Chronic pain   . HTN (hypertension)     sees Dr. Ardelle Park, Rosalita Levan  . Neuromuscular disorder     diabetic neuropathy  . Occlusion of carotid stent     Per patients nursing aid  . ESRD (end stage renal disease) on dialysis     hd - [facility] - [place]  . Pneumonia     hx of  . Diabetes mellitus     Sees Dr. Reather Littler [phone] -- fasting blood 70-110s  . DKA (diabetic ketoacidosis)     severe, Dec 2011  . History of blood transfusion   . On home  oxygen therapy     2l nasal cannula at night  . Hemodialysis patient     M, W, F    Past Surgical History  Procedure Laterality Date  . Av fistula placement  03/25/2008    right forearm  . Av fistula placement  04/23/2012    Procedure: INSERTION OF ARTERIOVENOUS (AV) GORE-TEX GRAFT THIGH;  Surgeon: Sherren Kerns, MD;  Location: [facility] OR;  Service: Vascular;  Laterality: Left;  . Arteriovenous graft placement Left     thigh  . Carotid stent insertion Right   . Dyalisis catheter    . Eye surgery Bilateral     cataracts  . Thyroid surgery  01/28/2013    Dr Gerrit Friends  . Lymph node biopsy Right 01/28/2013    Procedure: LYMPH NODE BIOPSY;  Surgeon: Velora Heckler, MD;  Location[facility] OR;  Service: General;  Laterality: Right;  . Mass biopsy N/A 01/28/2013    Procedure: NECK EXPLORATION;  Surgeon: Velora Heckler, MD;  Location: [facility] OR;  Service: General;  Laterality: N/A;  . Ligation arteriovenous gortex graft Right 02/27/2013  Procedure: LIGATION ARTERIOVENOUS GORTEX GRAFT;  Surgeon: Chuck Hint, MD;  Location: [facility] OR;  Service: Vascular;  Laterality: Right;  . Ligation arteriovenous gortex graft Left 03/03/2013    Procedure: LIGATION Left ARTERIOVENOUS GORTEX Thigh GRAFT;  Surgeon: Fransisco Hertz, MD;  Location: [facility] OR;  Service: Vascular;  Laterality: Left;  . Insertion of dialysis catheter Right 03/03/2013    Procedure: INSERTION OF DIALYSIS CATHETER;  Surgeon: Fransisco Hertz, MD;  Location: [facility] OR;  Service: Vascular;  Laterality: Right;  . Av fistula placement Left 03/14/2013    Procedure: INSERTION OF  ARTERIOVENOUS (AV) GORE-TEX GRAFT  LEFT THIGH;  Surgeon: Larina Earthly, MD;  Location: [facility] OR;  Service: Vascular;  Laterality: Left;    History   Social History  . Marital Status: Married    Spouse Name: N/A    Number of Children: N/A  . Years of Education: N/A   Occupational History  . Not on file.   Social History Main Topics  . Smoking status: Current Every Day Smoker -- 0.25  packs/day    Types: Cigarettes  . Smokeless tobacco: Never Used     Comment: pt states that he has been thinking about giving it up  . Alcohol Use: No  . Drug Use: No  . Sexual Activity: Not on file   Other Topics Concern  . Not on file   Social History Narrative  . No narrative on file    No family history on file.  Allergies as of 04/15/2013 - Review Complete 03/14/2013  Allergen Reaction Noted  . Penicillins Other (See Comments) 09/22/2011    Current Facility-Administered Medications on File Prior to Encounter  Medication Dose Route Frequency Provider Last Rate Last Dose  . vancomycin (VANCOCIN) 1,500 mg in sodium chloride 0.9 % 500 mL IVPB  1,500 mg Intravenous 120 min pre-op Larina Earthly, MD       Current Outpatient Prescriptions on File Prior to Encounter  Medication Sig Dispense Refill  . amLODipine (NORVASC) 10 MG tablet Take 10 mg by mouth at bedtime.       Marland Kitchen aspirin EC 81 MG tablet Take 81 mg by mouth daily.      . calcium acetate (PHOSLO) 667 MG capsule Take 1,334 mg by mouth 3 (three) times daily with meals.      . cloNIDine (CATAPRES) 0.1 MG tablet Take 0.1 mg by mouth 3 (three) times daily.      Marland Kitchen docusate sodium (COLACE) 100 MG capsule Take 100 mg by mouth 2 (two) times daily.       Marland Kitchen FLUoxetine (PROZAC) 10 MG tablet Take 5 mg by mouth every morning.       . insulin aspart (NOVOLOG) 100 UNIT/ML injection Inject 2-3 Units into the skin 3 (three) times daily before meals. Inject 2 units with breakfast, 3 units before lunch, and 3 units before supper  1 vial  12  . insulin glargine (LANTUS) 100 UNIT/ML injection Inject 0.05 mLs (5 Units total) into the skin at bedtime. Inject 13 units in the morning, and 9 units in the evening.  10 mL  12  . metoprolol (LOPRESSOR) 50 MG tablet Take 75 mg by mouth 2 (two) times daily. Hold morning dose on dialysis days      . omeprazole (PRILOSEC) 20 MG capsule Take 20 mg by mouth daily.      . pravastatin (PRAVACHOL) 10 MG tablet  Take 10 mg by mouth at bedtime.      Marland Kitchen  PROAIR HFA 108 (90 BASE) MCG/ACT inhaler Inhale 2 puffs into the lungs every 4 (four) hours as needed for wheezing or shortness of breath.       . promethazine (PHENERGAN) 25 MG tablet Take 25 mg by mouth every 6 (six) hours as needed. For nausea      . sevelamer carbonate (RENVELA) 800 MG tablet Take 800 mg by mouth 3 (three) times daily with meals.       . temazepam (RESTORIL) 15 MG capsule Take 15 mg by mouth 4 (four) times a week. Monday, Wednesday, Friday, saturday      . warfarin (COUMADIN) 7.5 MG tablet Take 1 tablet (7.5 mg total) by mouth daily. To Start after AVF placed on Friday  30 tablet  0  . EASY TOUCH INSULIN SYRINGE 31G X 5/16" 0.5 ML MISC          REVIEW OF SYSTEMS: Please see review of systems from admission history and physical.  There are no interval changes  PHYSICAL EXAMINATION: General: The patient appears their stated age.  Vital signs are BP 127/60  Pulse 97  Temp(Src) 99 F (37.2 C) (Oral)  Resp 18  Ht 6\' 2"  (1.88 m)  Wt 129 lb 8 oz (58.741 kg)  BMI 16.62 kg/m2  SpO2 98% Pulmonary: Respirations are non-labored HEENT:  No gross abnormalities Abdomen: Soft and non-tender  Musculoskeletal: There are no major deformities.   Neurologic: No focal weakness or paresthesias are detected, Skin: There are no ulcer or rashes noted. Psychiatric: The patient has normal affect. Cardiovascular: There is a regular rate and rhythm without significant murmur appreciated.  Tender, erythematous left femoral graft.  Diagnostic Studies: CT scan shows a fluid collection within the left groin consistent with likely infection   Assessment:  Infected left thigh dialysis graft Plan: The patient was taken to the operating room later this morning for removal of the graft.  Discussed the risk of bleeding as well as the risk of arterial disruption given the infection.  I will try to remove the entire graft and patch the artery.     Jorge Ny, M.D. [facility] Office: [phone] Pager:  [phone]

## 2013-04-16 NOTE — Anesthesia Postprocedure Evaluation (Signed)
Anesthesia Post Note  Patient: Marcus [name]  Procedure(s) Performed: Procedure(s) (LRB): REMOVAL OF ARTERIOVENOUS GORETEX GRAFT Left Thigh (Left) Patch Angioplasty of left femoral artery (Left)  Anesthesia type: General  Patient location: PACU  Post pain: Pain level controlled  Post assessment: Patient's Cardiovascular Status Stable  Last Vitals:  Filed Vitals:   04/16/13 1416  BP: 140/61  Pulse: 85  Temp:   Resp: 16    Post vital signs: Reviewed and stable  Level of consciousness: alert  Complications: No apparent anesthesia complications

## 2013-04-16 NOTE — Progress Notes (Signed)
New Admission Note:   Arrival Method:  Stretcher via [facility]  Mental Orientation: AO x4 Telemetry: placed  Assessment: Completed Skin: Dry, scaly, Ruptured blister to right foot,  Abrasion to right knee  IV: no piv Pain: denies Tubes:na Safety Measures: Safety Fall Prevention Plan has been given, discussed and signed Admission: Completed [address] [place] Orientation: Patient has been orientated to the room, unit and staff.  Family: none at bedside   Orders have been reviewed and implemented. Will continue to monitor the patient. Call light has been placed within reach and bed alarm has been activated.   Theresa Mulligan BSN, RN-BC  Phone number: [phone]

## 2013-04-16 NOTE — H&P (Signed)
[facility] History and Physical  Patient: Marcus [name]  ZOX:[id number]  DOB: 02/01/1956  DOS: the patient was seen and examined on 04/15/2013 PCP: Galvin Proffer, MD  Chief Complaint: left leg pain  HPI: Marcus [name] is a 57 y.o. male with Past medical history of ESRD on hemodialysis, CVA, GI bleed, pulmonary edema, dyslipidemia, hypothyroidism, hypertension, GERD, recent admissions for graft failure. The patient is coming from home. The patient presented with the complaint of left leg pain that has been ongoing since last 3 days and progressively worsening. He mentions that he has undergone dialysis today and the pain was worse off of that and therefore he came to the hospital. Pt denies any fever, chills, headache, cough, chest pain, palpitation, shortness of breath, orthopnea, PND, nausea, vomiting, abdominal pain, diarrhea, constipation, active bleeding, burning urination, dizziness, pedal edema,  focal neurological deficit. Event of the [facility] where he undergone a CT scan of his legs and was found to have an infected fluid collection around the AV graft and was sent here for further workup.  Review of Systems: as mentioned in the history of present illness.  A Comprehensive review of the other systems is negative.  Past Medical History  Diagnosis Date  . Hypothyroidism   . Hyperlipidemia   . CVA (cerebrovascular accident)     hx CVA x 2  . GI bleed     2006, mallory-weiss tear  . Pulmonary edema     nov 2011, dry wt decreased  . Goiter     by CT Apr 2013  . Depression   . Cocaine abuse     past history  . Tobacco abuse     history of  . GERD (gastroesophageal reflux disease)   . Cataract   . Insomnia   . Chronic pain   . HTN (hypertension)     sees Dr. Ardelle Park, Rosalita Levan  . Neuromuscular disorder     diabetic neuropathy  . Occlusion of carotid stent     Per patients nursing aid  . ESRD (end stage renal disease) on dialysis     hd - [facility] - [place]   . Pneumonia     hx of  . Diabetes mellitus     Sees Dr. Reather Littler [phone] -- fasting blood 70-110s  . DKA (diabetic ketoacidosis)     severe, Dec 2011  . History of blood transfusion   . On home oxygen therapy     2l nasal cannula at night  . Hemodialysis patient     M, W, F   Past Surgical History  Procedure Laterality Date  . Av fistula placement  03/25/2008    right forearm  . Av fistula placement  04/23/2012    Procedure: INSERTION OF ARTERIOVENOUS (AV) GORE-TEX GRAFT THIGH;  Surgeon: Sherren Kerns, MD;  Location: [facility] OR;  Service: Vascular;  Laterality: Left;  . Arteriovenous graft placement Left     thigh  . Carotid stent insertion Right   . Dyalisis catheter    . Eye surgery Bilateral     cataracts  . Thyroid surgery  01/28/2013    Dr Gerrit Friends  . Lymph node biopsy Right 01/28/2013    Procedure: LYMPH NODE BIOPSY;  Surgeon: Velora Heckler, MD;  Location: [facility] OR;  Service: General;  Laterality: Right;  . Mass biopsy N/A 01/28/2013    Procedure: NECK EXPLORATION;  Surgeon: Velora Heckler, MD;  Location: [facility] OR;  Service: General;  Laterality: N/A;  . Ligation arteriovenous gortex  graft Right 02/27/2013    Procedure: LIGATION ARTERIOVENOUS GORTEX GRAFT;  Surgeon: Chuck Hint, MD;  Location: [facility] OR;  Service: Vascular;  Laterality: Right;  . Ligation arteriovenous gortex graft Left 03/03/2013    Procedure: LIGATION Left ARTERIOVENOUS GORTEX Thigh GRAFT;  Surgeon: Fransisco Hertz, MD;  Location: [facility] OR;  Service: Vascular;  Laterality: Left;  . Insertion of dialysis catheter Right 03/03/2013    Procedure: INSERTION OF DIALYSIS CATHETER;  Surgeon: Fransisco Hertz, MD;  Location: [facility] OR;  Service: Vascular;  Laterality: Right;  . Av fistula placement Left 03/14/2013    Procedure: INSERTION OF  ARTERIOVENOUS (AV) GORE-TEX GRAFT  LEFT THIGH;  Surgeon: Larina Earthly, MD;  Location: [facility] OR;  Service: Vascular;  Laterality: Left;   Social History:  reports that he has been smoking  Cigarettes.  He has been smoking about 0.25 packs per day. He has never used smokeless tobacco. He reports that he does not drink alcohol or use illicit drugs. Independent for most of his  ADL.  Allergies  Allergen Reactions  . Penicillins Other (See Comments)    Unknown reaction    No family history on file.  Prior to Admission medications   Medication Sig Start Date End Date Taking? Authorizing Provider  amLODipine (NORVASC) 10 MG tablet Take 10 mg by mouth at bedtime.     Historical Provider, MD  aspirin EC 81 MG tablet Take 81 mg by mouth daily.    Historical Provider, MD  calcium acetate (PHOSLO) 667 MG capsule Take 1,334 mg by mouth 3 (three) times daily with meals.    Historical Provider, MD  cloNIDine (CATAPRES) 0.1 MG tablet Take 0.1 mg by mouth 3 (three) times daily.    Historical Provider, MD  docusate sodium (COLACE) 100 MG capsule Take 100 mg by mouth 2 (two) times daily.     Historical Provider, MD  EASY TOUCH INSULIN SYRINGE 31G X 5/16" 0.5 ML MISC  12/19/12   Historical Provider, MD  enoxaparin (LOVENOX) 40 MG/0.4ML injection Inject 0.5 mLs (50 mg total) into the skin daily. Take 50mg  SQ Daily on 11/26 and 11/27 03/11/13   Zannie Cove, MD  FLUoxetine (PROZAC) 10 MG tablet Take 5 mg by mouth every morning.     Historical Provider, MD  HYDROcodone-acetaminophen (NORCO/VICODIN) 5-325 MG per tablet Take 1-2 tablets by mouth every 4 (four) hours as needed. 03/14/13   Carma Lair Nickel, NP  insulin aspart (NOVOLOG) 100 UNIT/ML injection Inject 2-3 Units into the skin 3 (three) times daily before meals. Inject 2 units with breakfast, 3 units before lunch, and 3 units before supper 03/11/13   Zannie Cove, MD  insulin glargine (LANTUS) 100 UNIT/ML injection Inject 0.05 mLs (5 Units total) into the skin at bedtime. Inject 13 units in the morning, and 9 units in the evening. 03/11/13   Zannie Cove, MD  levothyroxine (SYNTHROID, LEVOTHROID) 112 MCG tablet Take 1.5 tablets (168 mcg  total) by mouth daily before breakfast. 03/11/13   Zannie Cove, MD  metoprolol (LOPRESSOR) 50 MG tablet Take 75 mg by mouth 2 (two) times daily. Hold morning dose on dialysis days    Historical Provider, MD  omeprazole (PRILOSEC) 20 MG capsule Take 20 mg by mouth daily.    Historical Provider, MD  pravastatin (PRAVACHOL) 10 MG tablet Take 10 mg by mouth at bedtime.    Historical Provider, MD  PROAIR HFA 108 (90 BASE) MCG/ACT inhaler Inhale 2 puffs into the lungs every 4 (four) hours as  needed for wheezing or shortness of breath.  12/08/12   Historical Provider, MD  promethazine (PHENERGAN) 25 MG tablet Take 25 mg by mouth every 6 (six) hours as needed. For nausea    Historical Provider, MD  sevelamer carbonate (RENVELA) 800 MG tablet Take 800 mg by mouth 3 (three) times daily with meals.  10/23/12   Historical Provider, MD  temazepam (RESTORIL) 15 MG capsule Take 15 mg by mouth 4 (four) times a week. Monday, Wednesday, Friday, saturday    Historical Provider, MD  warfarin (COUMADIN) 7.5 MG tablet Take 1 tablet (7.5 mg total) by mouth daily. To Start after AVF placed on Friday 03/14/13   Zannie Cove, MD    Physical Exam: Filed Vitals:   04/15/13 2250  BP: 133/66  Pulse: 83  Temp: 99.6 F (37.6 C)  TempSrc: Oral  Resp: 18  Weight: 58.741 kg (129 lb 8 oz)  SpO2: 98%    General: Alert, Awake and Oriented to Time, Place and Person. Appear in moderate distress Eyes: PERRL ENT: Oral Mucosa clear dry. Neck: No JVD Cardiovascular: S1 and S2 Present, aortic systolic Murmur, Peripheral Pulses Present Respiratory: Bilateral Air entry equal and Decreased, Clear to Auscultation,  No Crackles, no wheezes Abdomen: Bowel Sound Present, Soft and Non tender Skin: No Rash Extremities: No Pedal edema, no calf tenderness, left leg upper thigh warm red and tender with swelling Neurologic: Grossly Unremarkable.  Labs on Admission:  Laboratory findings from the other facility were evaluated he has  mild leukocytosis there with stable hemoglobin and stable creatinine with INR of 4. His CT scan of the left leg was suggestive of infected fluid around the graft, there was no stenosis noted in the graft they felt there could be a vascular venous stenosis CBC:  Recent Labs Lab 04/16/13 0141  WBC 8.8  NEUTROABS PENDING  HGB 11.5*  HCT 36.5*  MCV 96.1  PLT PENDING    EKG: Independently reviewed. normal sinus rhythm, nonspecific ST and T waves changes.  Assessment/Plan Principal Problem:   Vascular graft infection Active Problems:   ESRD (end stage renal disease) on dialysis   Unspecified hypothyroidism   Unspecified essential hypertension   DVT of axillary vein, acute right   Diabetes mellitus   1. Vascular graft infection Patient has presented with complaints of left leg pain and a CT MG has conformed a possibility of graft infection. Vascular surgery has been consulted who has recommended to keep the patient n.p.o. for possible procedure in the morning. At present I will give him IV vancomycin and considering his penicillin allergy I will give him IV cefepime. Blood culture has not been drawn which are withdrawal here. I would also get a type and screen for possible procedure in the morning He appears to have elevated INR which may need to be reversed for the procedure for which he may need to be placed on heparin I will check his INR here before deciding further course of action.  2. Diabetes mellitus The patient is n.p.o. for possible procedure he will be placed on sliding scale only  3. ESRD Nephrology has been consulted for continuation of hemodialysis  4. Hypertension At present holding his blood pressure medications for one day and will resume the rest medications from tomorrow Also holding his aspirin before surgery will resume tomorrow  Consults: Nephrology and vascular surgery  DVT Prophylaxis: mechanical compression device Nutrition: N.p.o.  Code Status:  Full  Disposition: Admitted to inpatient in telemetry unit.  Author: Lynden Oxford,  MD [facility] Pager: [phone] 04/16/2013, 12:07 AM    If 7PM-7AM, please contact night-coverage [email or web address] Password [email or web address]

## 2013-04-16 NOTE — Op Note (Signed)
    Patient name: Marcus [name] MRN: [id number] DOB: 07/01/1955 Sex: male  04/15/2013 - 04/16/2013 Pre-operative Diagnosis: Infected left thigh dialysis graft Post-operative diagnosis:  Same Surgeon:  Jorge Ny Assistants: Narda Amber, PA Procedure:  #1: Removal of left thigh dialysis graft   #2: Primary repair of right common femoral vein venotomy   #3: Patch angioplasty, left common femoral artery Anesthesia:  Gen. Blood Loss:  See anesthesia record Specimens:  Anaerobic and aerobic cultures were sent  Findings:  Fluid collection which appeared purulent around the arterial and venous anastomosis.  Indications:  The patient recently had a redo left thigh dialysis graft placed which has now become infected as evidenced by erythema and tenderness around the incision as well as a CT scan which showed a large fluid collection which was rim-enhancing, indicating infection  Procedure:  The patient was identified in the holding area and taken to [facility] OR ROOM 16  The patient was then placed supine on the table. general anesthesia was administered.  The patient was prepped and draped in the usual sterile fashion.  A time out was called and antibiotics were administered.  The patient's a vertical inguinal incision was reopened with #10 blade.  Cautery is used to subcutaneous tissue.  Immediately, I entered a large fluid collection which appeared lymphatic in origin however it was turbid in color.  Cultures for anaerobic and aerobic specimens were sent.  I then dissected out the arterial and venous anastomosis.  The arterial anastomosis has slightly dehisced prior to the dissection.  Other than that the vein was relatively well incorporated as was the portion of the graft not near the arterial venous anastomosis.  I used several counterincisions over top of the graft to expose the graft and ultimately removed the entire graft.  In order for the arterial repair, the patient was fully heparinized.  I  removed the venous anastomosis first.  Placed a Cooley clamp and then used a #11 blade to remove the graft to vein anastomosis.  I elected to primarily close the common femoral vein using a running #5 Prolene suture in 2 layers.  Next, attention was turned with the artery.  Peripheral DeBakeys clamps were placed above and below the graft to artery anastomosis.  The anastomosis was then taken down sharply.  The edges were trimmed so that no necrotic tissue was left.  I selected a core matrix patch and performed patch angioplasty of the arteriotomy with a running 5-0 Prolene.  One repair stitch was required for hemostasis.  Prior to completion, all appropriate flushing maneuvers were performed.  The wound was then copiously irrigated.  The incisions used to remove the graft were loosely reapproximated with staples.  In the groin I attempted to use 2-0 Vicryl to get tissue coverage over the artery and vein however it was difficult to get the tissue to hold the suture as it was very indurated.  I was able to get several interrupted 2-0 Vicryl simple a.  I then elected to place a 2-0 nylon vertical mattress suture.  3 of these were placed and then the caps were placed with Betadine soaked gauze.  Sterile dressings were then applied   Disposition:  To PACU in stable condition.   Juleen China, M.D. [facility] [place] [facility] Office: [phone] Pager:  [phone]

## 2013-04-16 NOTE — Preoperative (Addendum)
Beta Blockers   Reason not to administer Beta Blockers:held upon admission will address intraoperatively

## 2013-04-16 NOTE — Progress Notes (Signed)
ANTICOAGULATION CONSULT NOTE - Initial Consult  Pharmacy Consult for Heparin Indication: DVT  Allergies  Allergen Reactions  . Penicillins Other (See Comments)    Unknown reaction    Patient Measurements: Weight: 129 lb 8 oz (58.741 kg)  Vital Signs: Temp: 99.6 F (37.6 C) (12/30 2250) Temp src: Oral (12/30 2250) BP: 133/66 mmHg (12/30 2250) Pulse Rate: 83 (12/30 2250)  Labs:  Recent Labs  04/16/13 0141  HGB 11.5*  HCT 36.5*  PLT PENDING  APTT 67*  LABPROT 33.9*  INR 3.51*  CREATININE 4.83*    The CrCl is unknown because both a height and weight (above a minimum accepted value) are required for this calculation.  Assessment: 57 yo male with h/o DVT, Coumadin on hold for OR, for heparin.  Vitamin K 5 mg IV ordered.  Goal of Therapy:  Heparin level 0.3-0.7 units/ml Monitor platelets by anticoagulation protocol: Yes   Plan:  Will recheck INR at 1000 and start heparin once INR < 2.  Marcus [name], Marcus [name] 04/16/2013,2:55 AM

## 2013-04-16 NOTE — Anesthesia Preprocedure Evaluation (Signed)
Anesthesia Evaluation  Patient identified by MRN, date of birth, ID band Patient awake    Reviewed: Allergy & Precautions, H&P , NPO status , Patient's Chart, lab work & pertinent test results, reviewed documented beta blocker date and time   Airway Mallampati: II TM Distance: >3 FB Neck ROM: full    Dental   Pulmonary pneumonia -, Current Smoker,  breath sounds clear to auscultation        Cardiovascular hypertension, Pt. on medications and Pt. on home beta blockers + Peripheral Vascular Disease Rhythm:regular     Neuro/Psych PSYCHIATRIC DISORDERS Depression  Neuromuscular disease CVA    GI/Hepatic Neg liver ROS, GERD-  Medicated and Controlled,  Endo/Other  diabetes, Insulin DependentHypothyroidism   Renal/GU Dialysis and ESRFRenal disease  negative genitourinary   Musculoskeletal   Abdominal   Peds  Hematology negative hematology ROS (+)   Anesthesia Other Findings See surgeon's H&P   Reproductive/Obstetrics negative OB ROS                           Anesthesia Physical Anesthesia Plan  ASA: IV  Anesthesia Plan: General   Post-op Pain Management:    Induction: Intravenous  Airway Management Planned: LMA  Additional Equipment:   Intra-op Plan:   Post-operative Plan:   Informed Consent: I have reviewed the patients History and Physical, chart, labs and discussed the procedure including the risks, benefits and alternatives for the proposed anesthesia with the patient or authorized representative who has indicated his/her understanding and acceptance.   Dental Advisory Given  Plan Discussed with: CRNA and Surgeon  Anesthesia Plan Comments:         Anesthesia Quick Evaluation

## 2013-04-17 DIAGNOSIS — E1065 Type 1 diabetes mellitus with hyperglycemia: Secondary | ICD-10-CM

## 2013-04-17 DIAGNOSIS — IMO0002 Reserved for concepts with insufficient information to code with codable children: Secondary | ICD-10-CM

## 2013-04-17 LAB — BASIC METABOLIC PANEL
BUN: 32 mg/dL — ABNORMAL HIGH (ref 6–23)
CO2: 21 mEq/L (ref 19–32)
Calcium: 8 mg/dL — ABNORMAL LOW (ref 8.4–10.5)
Chloride: 88 mEq/L — ABNORMAL LOW (ref 96–112)
Creatinine, Ser: 6.75 mg/dL — ABNORMAL HIGH (ref 0.50–1.35)
GFR calc Af Amer: 9 mL/min — ABNORMAL LOW (ref >90)
GFR calc non Af Amer: 8 mL/min — ABNORMAL LOW (ref >90)
Glucose, Bld: 195 mg/dL — ABNORMAL HIGH (ref 70–99)
Potassium: 3.8 mEq/L (ref 3.7–5.3)
Sodium: 129 mEq/L — ABNORMAL LOW (ref 137–147)

## 2013-04-17 LAB — CBC
HCT: 31.4 % — ABNORMAL LOW (ref 39.0–52.0)
Hemoglobin: 10.1 g/dL — ABNORMAL LOW (ref 13.0–17.0)
MCH: 30.4 pg (ref 26.0–34.0)
MCHC: 32.2 g/dL (ref 30.0–36.0)
MCV: 94.6 fL (ref 78.0–100.0)
Platelets: 331 10*3/uL (ref 150–400)
RBC: 3.32 MIL/uL — ABNORMAL LOW (ref 4.22–5.81)
RDW: 16.6 % — ABNORMAL HIGH (ref 11.5–15.5)
WBC: 8.6 10*3/uL (ref 4.0–10.5)

## 2013-04-17 LAB — GLUCOSE, CAPILLARY
Glucose-Capillary: 136 mg/dL — ABNORMAL HIGH (ref 70–99)
Glucose-Capillary: 140 mg/dL — ABNORMAL HIGH (ref 70–99)
Glucose-Capillary: 151 mg/dL — ABNORMAL HIGH (ref 70–99)
Glucose-Capillary: 209 mg/dL — ABNORMAL HIGH (ref 70–99)
Glucose-Capillary: 273 mg/dL — ABNORMAL HIGH (ref 70–99)

## 2013-04-17 LAB — PROTIME-INR
INR: 1.47 (ref 0.00–1.49)
Prothrombin Time: 17.4 seconds — ABNORMAL HIGH (ref 11.6–15.2)

## 2013-04-17 LAB — HEPARIN LEVEL (UNFRACTIONATED)

## 2013-04-17 MED ORDER — HEPARIN (PORCINE) IN NACL 100-0.45 UNIT/ML-% IJ SOLN
900.0000 [IU]/h | INTRAMUSCULAR | Status: DC
  Administered 2013-04-17: 900 [IU]/h via INTRAVENOUS
  Filled 2013-04-17: qty 250

## 2013-04-17 MED ORDER — WARFARIN SODIUM 7.5 MG PO TABS
7.5000 mg | ORAL_TABLET | Freq: Once | ORAL | Status: AC
  Administered 2013-04-17: 7.5 mg via ORAL
  Filled 2013-04-17: qty 1

## 2013-04-17 MED ORDER — INSULIN ASPART 100 UNIT/ML ~~LOC~~ SOLN
0.0000 [IU] | Freq: Three times a day (TID) | SUBCUTANEOUS | Status: DC
  Administered 2013-04-18 – 2013-04-20 (×4): 2 [IU] via SUBCUTANEOUS
  Administered 2013-04-20: 3 [IU] via SUBCUTANEOUS
  Administered 2013-04-20: 5 [IU] via SUBCUTANEOUS
  Administered 2013-04-21: 7 [IU] via SUBCUTANEOUS

## 2013-04-17 MED ORDER — HEPARIN (PORCINE) IN NACL 100-0.45 UNIT/ML-% IJ SOLN
1100.0000 [IU]/h | INTRAMUSCULAR | Status: DC
  Administered 2013-04-17: 1100 [IU]/h via INTRAVENOUS
  Filled 2013-04-17 (×2): qty 250

## 2013-04-17 MED ORDER — INSULIN ASPART 100 UNIT/ML ~~LOC~~ SOLN
0.0000 [IU] | Freq: Every day | SUBCUTANEOUS | Status: DC
  Administered 2013-04-18: 2 [IU] via SUBCUTANEOUS

## 2013-04-17 MED ORDER — INSULIN GLARGINE 100 UNIT/ML ~~LOC~~ SOLN
5.0000 [IU] | Freq: Every day | SUBCUTANEOUS | Status: DC
  Administered 2013-04-17 – 2013-04-19 (×2): 5 [IU] via SUBCUTANEOUS
  Filled 2013-04-17 (×5): qty 0.05

## 2013-04-17 MED ORDER — LEVOTHYROXINE SODIUM 112 MCG PO TABS
168.0000 ug | ORAL_TABLET | Freq: Every day | ORAL | Status: DC
  Administered 2013-04-18 – 2013-04-20 (×3): 168 ug via ORAL
  Filled 2013-04-17 (×5): qty 1.5

## 2013-04-17 MED ORDER — WARFARIN - PHARMACIST DOSING INPATIENT
Freq: Every day | Status: DC
  Administered 2013-04-19 – 2013-04-20 (×2)

## 2013-04-17 NOTE — Progress Notes (Signed)
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 On IV antibiotics. Awaiting for cultures Active Problems:   ESRD (end stage renal disease) on dialysis: For dialysis tomorrow. Appreciate nephrology help   Type I (juvenile type) diabetes mellitus without mention of complication, uncontrolled: Sugar is elevated. We'll restart Lantus   Unspecified hypothyroidism: TSH markedly elevated and see no record of patient on Synthroid. Discharge summary approximately 6 weeks ago, patient is supposed to be on 168 mcg and given markedly elevated TSH, I suspect that he has not been taking his medications.   Unspecified essential hypertension: Blood pressure stable, better after dialysis   DVT of axillary vein, acute right: On Coumadin. INR supratherapeutic initially, reversed for surgery. Pharmacy now titrating   Protein-calorie malnutrition, severe: Patient meets criteria in the context of chronic illness   Code Status: Full code Family Communication: Left message with family Disposition Plan: Here for several days, awaiting cultures   Consultants:  Nephrology  Vascular surgery  Nutrition  Procedures:  12/31: Removal of infected left femoral AV graft and placement of right lower extremity permacath    Antibiotics:  IV vancomycin and cefepime day 2  HPI/Subjective: Patient doing okay. Tired. No complaints other than soreness in his lower extremities  Objective: Filed Vitals:   04/17/13 1623  BP: 142/84  Pulse: 67  Temp: 98.3 F (36.8 C)  Resp: 17    Intake/Output Summary (Last 24 hours) at 04/17/13 1745 Last data filed at 04/17/13 1500  Gross per 24 hour  Intake  606.6 ml  Output      0 ml  Net  606.6 ml   Filed Weights   04/15/13 2250 04/16/13 2115  Weight: 58.741 kg (129 lb 8 oz) 61.689 kg (136 lb)     Exam:   General:  Fatigued, somewhat somnolent  Cardiovascular: Regular rate and rhythm, S1-S2  Respiratory: Clear to auscultation bilaterally  Abdomen: Soft, nontender, nondistended, positive bowel sounds  Musculoskeletal: Left lower extremity bandaged, right noted femoral permacath  Data Reviewed: Basic Metabolic Panel:  Recent Labs Lab 04/16/13 0141 04/17/13 0525  NA 133* 129*  K 3.7 3.8  CL 91* 88*  CO2 26 21  GLUCOSE 202* 195*  BUN 22 32*  CREATININE 4.83* 6.75*  CALCIUM 8.4 8.0*  PHOS 2.3  --    Liver Function Tests:  Recent Labs Lab 04/16/13 0141  AST 9  ALT <5  ALKPHOS 95  BILITOT 0.3  PROT 9.2*  ALBUMIN 2.8*   No results found for this basename: LIPASE, AMYLASE,  in the last 168 hours No results found for this basename: AMMONIA,  in the last 168 hours CBC:  Recent Labs Lab 04/16/13 0141 04/17/13 0525  WBC 8.8 8.6  NEUTROABS 6.4  --   HGB 11.5* 10.1*  HCT 36.5* 31.4*  MCV 96.1 94.6  PLT 269 331   Cardiac Enzymes: No results found for this basename: CKTOTAL, CKMB, CKMBINDEX, TROPONINI,  in the last 168 hours BNP (last 3 results) No results found for this basename: PROBNP,  in the last 8760 hours CBG:  Recent Labs Lab 04/16/13 1741 04/17/13 0019 04/17/13 0602 04/17/13 1131 04/17/13 1621  GLUCAP 248* 273* 209* 151* 140*    Recent Results (from the past 240 hour(s))  SURGICAL PCR SCREEN     Status: None   Collection Time    04/16/13 12:11 AM  Result Value Range Status   MRSA, PCR NEGATIVE  NEGATIVE Final   Staphylococcus aureus NEGATIVE  NEGATIVE Final   Comment:            The Xpert SA Assay (FDA     approved for NASAL specimens     in patients over 65 years of age),     is one component of     a comprehensive surveillance     program.  Test performance has     been validated by [employer] [facility] for patients greater     than or equal to 56 year old.     It is not intended     to diagnose infection nor to      guide or monitor treatment.  CULTURE, BLOOD (ROUTINE X 2)     Status: None   Collection Time    04/16/13  1:41 AM      Result Value Range Status   Specimen Description BLOOD LEFT ARM   Final   Special Requests BOTTLES DRAWN AEROBIC ONLY 3CC   Final   Culture  Setup Time     Final   Value: 04/16/2013 09:02     Performed at [facility]   Culture     Final   Value:        BLOOD CULTURE RECEIVED NO GROWTH TO DATE CULTURE WILL BE HELD FOR 5 DAYS BEFORE ISSUING A FINAL NEGATIVE REPORT     Performed at [facility]   Report Status PENDING   Incomplete  CULTURE, BLOOD (ROUTINE X 2)     Status: None   Collection Time    04/16/13  1:51 AM      Result Value Range Status   Specimen Description BLOOD LEFT HAND   Final   Special Requests BOTTLES DRAWN AEROBIC ONLY 2CC   Final   Culture  Setup Time     Final   Value: 04/16/2013 09:01     Performed at [facility]   Culture     Final   Value:        BLOOD CULTURE RECEIVED NO GROWTH TO DATE CULTURE WILL BE HELD FOR 5 DAYS BEFORE ISSUING A FINAL NEGATIVE REPORT     Performed at [facility]   Report Status PENDING   Incomplete  WOUND CULTURE     Status: None   Collection Time    04/16/13 11:45 AM      Result Value Range Status   Specimen Description WOUND LEFT GROIN   Final   Special Requests SWAB OF LEFT GROIN WOUND PT ON VANCPMYCIN   Final   Gram Stain     Final   Value: RARE WBC PRESENT, PREDOMINANTLY PMN     RARE SQUAMOUS EPITHELIAL CELLS PRESENT     NO ORGANISMS SEEN     Performed at [facility]   Culture     Final   Value: NO GROWTH 1 DAY     Performed at [facility]   Report Status PENDING   Incomplete  ANAEROBIC CULTURE     Status: None   Collection Time    04/16/13 11:45 AM      Result Value Range Status   Specimen Description WOUND LEFT GROIN   Final   Special Requests SWAB ON LEFT GROIN WOUND PT ON VANCOMYCIN   Final   Gram Stain     Final   Value: RARE WBC PRESENT,  PREDOMINANTLY  PMN     RARE SQUAMOUS EPITHELIAL CELLS PRESENT     NO ORGANISMS SEEN     [facility]formed at [facility]   Culture     Final   Value: NO ANAEROBES ISOLATED; CULTURE IN PROGRESS FOR 5 DAYS     Performed at [facility]   Report Status PENDING   Incomplete     Studies: No results found.  Scheduled Meds: . amLODipine  10 mg Oral QHS  . aspirin EC  81 mg Oral Daily  . ceFEPime (MAXIPIME) IV  2 g Intravenous Q M,W,F-HD  . cloNIDine  0.1 mg Oral TID  . [START ON 04/18/2013] darbepoetin (ARANESP) injection - DIALYSIS  150 mcg Intravenous Q Fri-HD  . [START ON 04/18/2013] ferric gluconate (FERRLECIT/NULECIT) IV  62.5 mg Intravenous Q M,W,F-HD  . FLUoxetine  5 mg Oral Daily  . insulin aspart  0-15 Units Subcutaneous Q6H  . insulin aspart  0-5 Units Subcutaneous QHS  . levothyroxine  150 mcg Oral QAC breakfast  . metoprolol  75 mg Oral BID  . multivitamin  1 tablet Oral QHS  . sodium chloride  3 mL Intravenous Q12H  . vancomycin  500 mg Intravenous Q M,W,F-HD  . warfarin  7.5 mg Oral ONCE-1800  . Warfarin - Pharmacist Dosing Inpatient   Does not apply q1800   Continuous Infusions: . heparin 900 Units/hr (04/17/13 1500)    Principal Problem:   Vascular graft infection Active Problems:   ESRD (end stage renal disease) on dialysis   Type I (juvenile type) diabetes mellitus without mention of complication, uncontrolled   Unspecified hypothyroidism   Unspecified essential hypertension   DVT of axillary vein, acute right   Protein-calorie malnutrition, severe   Diabetes mellitus    Time spent: 35 minutes    Hollice EspyKRISHNAN,Tabrina Esty K  [facility] Pager [phone]. If 7PM-7AM, please contact night-coverage at [email or web address], password [email or web address] 04/17/2013, 5:45 PM  LOS: 2 days

## 2013-04-17 NOTE — Progress Notes (Signed)
ANTICOAGULATION CONSULT NOTE - Follow Up Consult  Pharmacy Consult for heparin Indication: Hx DVT  Allergies  Allergen Reactions  . Penicillins Other (See Comments)    Unknown reaction    Patient Measurements: Height: 6\' 2"  (188 cm) Weight: 138 lb 9.6 oz (62.869 kg) IBW/kg (Calculated) : 82.2 Heparin Dosing Weight: 62.9 kg  Vital Signs: Temp: 97.9 F (36.6 C) (01/01 2035) Temp src: Oral (01/01 2035) BP: 126/62 mmHg (01/01 2035) Pulse Rate: 67 (01/01 2035)  Labs:  Recent Labs  04/16/13 0141 04/16/13 0945 04/17/13 0525 04/17/13 1940  HGB 11.5*  --  10.1*  --   HCT 36.5*  --  31.4*  --   PLT 269  --  331  --   APTT 67*  --   --   --   LABPROT 33.9* 19.2* 17.4*  --   INR 3.51* 1.67* 1.47  --   HEPARINUNFRC  --   --   --  <0.10*  CREATININE 4.83*  --  6.75*  --     Estimated Creatinine Clearance: 10.7 ml/min (by C-G formula based on Cr of 6.75).   Medications:  Infusions:  . heparin  900 units/hr    Assessment: 58 yo male on chronic Coumadin for hx DVT s/p OR for removal of infected dialysis graft.  Heparin and Coumadin resumed this morning.  Initial heparin level undetectable on heparin 900 units/hr.  Per RN, no issues with IV to her knowledge.    Goal of Therapy:  Heparin level 0.3-0.7 units/ml Monitor platelets by anticoagulation protocol: Yes   Plan:  1. Increase IV heparin to 1100 units/hr. 2. Recheck heparin level with AM labs. 3. Continue daily heparin level and CBC.  Tad MooreJessica Franchelle Foskett, Pharm D, BCPS  Clinical Pharmacist Pager [phone]  04/17/2013 9:33 PM

## 2013-04-17 NOTE — Progress Notes (Signed)
Subjective:  Feels better than yesterday  Objective Vital signs in last 24 hours: Filed Vitals:   04/16/13 1800 04/16/13 2115 04/17/13 0546 04/17/13 0818  BP: 153/71 138/62 138/63 125/63  Pulse:  81 81 81  Temp:  98.6 F (37 C) 98.7 F (37.1 C) 98.4 F (36.9 C)  TempSrc:  Oral Oral   Resp:  14 15 16   Height:  6\' 2"  (1.88 m)    Weight:  61.689 kg (136 lb)    SpO2: 100% 96% 100% 100%   Weight change: 2.948 kg (6 lb 8 oz)  Intake/Output Summary (Last 24 hours) at 04/17/13 1211 Last data filed at 04/17/13 1059  Gross per 24 hour  Intake    670 ml  Output     50 ml  Net    620 ml   Labs: Basic Metabolic Panel:  Recent Labs Lab 04/16/13 0141 04/17/13 0525  NA 133* 129*  K 3.7 3.8  CL 91* 88*  CO2 26 21  GLUCOSE 202* 195*  BUN 22 32*  CREATININE 4.83* 6.75*  CALCIUM 8.4 8.0*  PHOS 2.3  --    Liver Function Tests:  Recent Labs Lab 04/16/13 0141  AST 9  ALT <5  ALKPHOS 95  BILITOT 0.3  PROT 9.2*  ALBUMIN 2.8*   CBC:  Recent Labs Lab 04/16/13 0141 04/17/13 0525  WBC 8.8 8.6  NEUTROABS 6.4  --   HGB 11.5* 10.1*  HCT 36.5* 31.4*  MCV 96.1 94.6  PLT 269 331   CBG:  Recent Labs Lab 04/16/13 1335 04/16/13 1741 04/17/13 0019 04/17/13 0602 04/17/13 1131  GLUCAP 218* 248* 273* 209* 151*    Medications:   . amLODipine  10 mg Oral QHS  . aspirin EC  81 mg Oral Daily  . ceFEPime (MAXIPIME) IV  2 g Intravenous Q M,W,F-HD  . cloNIDine  0.1 mg Oral TID  . [START ON 04/18/2013] darbepoetin (ARANESP) injection - DIALYSIS  150 mcg Intravenous Q Fri-HD  . [START ON 04/18/2013] ferric gluconate (FERRLECIT/NULECIT) IV  62.5 mg Intravenous Q M,W,F-HD  . FLUoxetine  5 mg Oral Daily  . insulin aspart  0-15 Units Subcutaneous Q6H  . insulin aspart  0-5 Units Subcutaneous QHS  . levothyroxine  150 mcg Oral QAC breakfast  . metoprolol  75 mg Oral BID  . multivitamin  1 tablet Oral QHS  . sodium chloride  3 mL Intravenous Q12H  . vancomycin  500 mg Intravenous Q  M,W,F-HD  . warfarin  7.5 mg Oral ONCE-1800  . Warfarin - Pharmacist Dosing Inpatient   Does not apply q1800   General:Alert. Thin chronically ill BM/ NAD/Appropriate Heart: RRR, no rub or mur  Lungs: CTA  Abdomen: bs pos. , soft , nontender  Extremities: No pedal edema / Left fem . Bandage dry and clean Dialysis Access: Right Fem perm cath   Dialysis Orders: Center: [facility] on MWF ( Holiday schedule= .Sun Tue FRI)  EDW 60.0 HD Bath 2.0 k, 2.25 ca Time 4hr Heparin 2400. Access R fem Perm cath / LFem Avgg 03/14/13 insert BFR 400 DFR 800  Zemplar 0 mcg IV/HD Epogen 18000 Units IV/HD Venofer 50mg  Weekly hd  Other Labs op= 56 pth, hgb 10.4<9.8 TFS 38%   Assessment/Plan  1. Left Fem. AVGG infected- Dr. Myra Gianotti removal yesterday  on IV vanco and Maxipime / bld cult and wound cul pending 2. ESRD - schedule MWF ([place]) holiday schedule= sun ,tue, fri/ k=3.8 / using R fem perm cath. 3.  Hypertension/volume - stable post op/ on Clonidine0.1mg  tid/lopressor 75mg  bid// norvasc 10mg  / ith HD fu bp ? Taper back meds if Sig . Drop On hd/ curreently 1.7 kg > edw  / attempt 2 l uf hd in am 4. Anemia - continue ESA/ iron weekly 5. Metabolic bone disease - No vit . Hold renvela binder with phos 2.3 6. Ho RUE DVT( axillary and jugular vein) - was on Coumadin With admit INR 3.51 7. DM type 1 - per admit team. 8. Ho CVA 9. HO Lymphocytic Thyroiditis  Lenny Pastelavid Zeyfang, PA-C [facility] Beeper [phone] 04/17/2013,12:11 PM  LOS: 2 days   I have seen and examined patient, discussed with PA and agree with assessment and plan as outlined above. Vinson Moselleob Keonta Monceaux MD pager [phone]    cell [phone] 04/17/2013, 1:47 PM

## 2013-04-17 NOTE — Progress Notes (Addendum)
ANTIBIOTIC and ANTICOAGULATION CONSULT NOTE - FOLLOW UP  Pharmacy Consult for Vancomycin, Cefepime, and Warfarin/heparin bridge  Indication: infected AVG, Hx DVT  Allergies  Allergen Reactions  . Penicillins Other (See Comments)    Unknown reaction    Patient Measurements: Height: 6\' 2"  (188 cm) Weight: 136 lb (61.689 kg) IBW/kg (Calculated) : 82.2  Vital Signs: Temp: 98.4 F (36.9 C) (01/01 0818) Temp src: Oral (01/01 0546) BP: 125/63 mmHg (01/01 0818) Pulse Rate: 81 (01/01 0818) Intake/Output from previous day: 12/31 0701 - 01/01 0700 In: 670 [P.O.:120; I.V.:550] Out: 250 [Blood:250] Intake/Output from this shift: Total I/O In: 200 [P.O.:120; Other:80] Out: -   Labs:  Recent Labs  04/16/13 0141 04/17/13 0525  WBC 8.8 8.6  HGB 11.5* 10.1*  PLT 269 331  CREATININE 4.83* 6.75*   Estimated Creatinine Clearance: 10.5 ml/min (by C-G formula based on Cr of 6.75). No results found for this basename: VANCOTROUGH, Leodis BinetVANCOPEAK, VANCORANDOM, GENTTROUGH, GENTPEAK, GENTRANDOM, TOBRATROUGH, TOBRAPEAK, TOBRARND, AMIKACINPEAK, AMIKACINTROU, AMIKACIN,  in the last 72 hours   Microbiology: Recent Results (from the past 720 hour(s))  SURGICAL PCR SCREEN     Status: None   Collection Time    04/16/13 12:11 AM      Result Value Range Status   MRSA, PCR NEGATIVE  NEGATIVE Final   Staphylococcus aureus NEGATIVE  NEGATIVE Final   Comment:            The Xpert SA Assay (FDA     approved for NASAL specimens     in patients over 58 years of age),     is one component of     a comprehensive surveillance     program.  Test performance has     been validated by [facility] for patients greater     than or equal to 58 year old.     It is not intended     to diagnose infection nor to     guide or monitor treatment.  WOUND CULTURE     Status: None   Collection Time    04/16/13 11:45 AM      Result Value Range Status   Specimen Description WOUND LEFT GROIN   Final   Special  Requests SWAB OF LEFT GROIN WOUND PT ON VANCPMYCIN   Final   Gram Stain     Final   Value: RARE WBC PRESENT, PREDOMINANTLY PMN     RARE SQUAMOUS EPITHELIAL CELLS PRESENT     NO ORGANISMS SEEN     Performed at [facility]   Culture     Final   Value: NO GROWTH 1 DAY     Performed at [facility]   Report Status PENDING   Incomplete  ANAEROBIC CULTURE     Status: None   Collection Time    04/16/13 11:45 AM      Result Value Range Status   Specimen Description WOUND LEFT GROIN   Final   Special Requests SWAB ON LEFT GROIN WOUND PT ON VANCOMYCIN   Final   Gram Stain     Final   Value: RARE WBC PRESENT, PREDOMINANTLY PMN     RARE SQUAMOUS EPITHELIAL CELLS PRESENT     NO ORGANISMS SEEN     Performed at [facility]   Culture PENDING   Incomplete   Report Status PENDING   Incomplete    Medical History: Past Medical History  Diagnosis Date  . Hypothyroidism   .  Hyperlipidemia   . CVA (cerebrovascular accident)     hx CVA x 2  . GI bleed     2006, mallory-weiss tear  . Pulmonary edema     nov 2011, dry wt decreased  . Goiter     by CT Apr 2013  . Depression   . Cocaine abuse     past history  . Tobacco abuse     history of  . GERD (gastroesophageal reflux disease)   . Cataract   . Insomnia   . Chronic pain   . HTN (hypertension)     sees Dr. Ardelle Park, Rosalita Levan  . Neuromuscular disorder     diabetic neuropathy  . Occlusion of carotid stent     Per patients nursing aid  . ESRD (end stage renal disease) on dialysis     hd - [facility] - [place]  . Pneumonia     hx of  . Diabetes mellitus     Sees Dr. Reather Littler [phone] -- fasting blood 70-110s  . DKA (diabetic ketoacidosis)     severe, Dec 2011  . History of blood transfusion   . On home oxygen therapy     2l nasal cannula at night  . Hemodialysis patient     M, W, F    Assessment: 58 yoM with infected AVG s/p OR 12/31 for removal of L thigh dialysis graft  Infectious disease: Vancomcyin  and Cefepime for infected AVG; WBC 8.6, afebrile, ESRD HD MWF Vancomycin 1g x 1 given at [facility] 12/30 10pm  Antibiotics: Vanc 500mg  qHD (MWF) 12/30 > Cefepime 2g qHD12/31>>  Cultures 12/31 wound>>  Anticoagulation Coumadin (PTA 7.5 mg daily per med rec) for recent RUE DVT INR at [facility] 4.5 04/16/2013 3:00 AM 12/31 INR here 3.5 MD ordered Vit K 5 mg IV. Per Dr. Myra Gianotti from vascular surgery, would not restart coumadin or heparin for potential of revisiting OR (no plans stated, but precautions for site of infection); reassessment 01/01 01/01 spoke to Dr. Edilia Bo from vascular surgery - ok'ed to resume anticoagulation, INR this AM 1.47  H/H 10.1, Plts 331. No signs of active bleeding  Goal of Therapy:  Vanc Pre-HD level 15-25 HL 0.3-0.7 Goal INR 2-3  Plan:  Vanco 500 mg q HD (scheduled MWF for now) Will order AM VR to help dosing (last dose 12/30 10pm) Cefepime 2g q HD Will start conservative dosing, Heparin 900 units/hr with 6hr HL Coumadin 7.5 mg x 1 tonight Daily PT/INR Daily HL, CBC  Monitor for signs of bleeding  Bearl Talarico B. Artelia Laroche, PharmD Clinical Pharmacist - Resident Pager: [phone] Phone: [phone] 04/17/2013 11:46 AM

## 2013-04-17 NOTE — Clinical Documentation Improvement (Signed)
Presents with graft infection. Nutritional Consult notes: severe malnutrition.   Patient is 6\' 2"  tall and weighs 129 pounds  BMI = 16.62  Recommendations are: Breeze PO BID and Prostat PO BID  Severe Malnutrition not noted on Problem List  Please clarify if you agree with Consultant's findings and document such in progress note and discharge summary.  Thank you very much for your time. Happy New Year!   Thank You, Shellee MiloEileen T Jaylaa Gallion ,RN Clinical Documentation Specialist:  [phone]  [facility] Health Information Management Cell: [phone]

## 2013-04-17 NOTE — Progress Notes (Addendum)
[facility]  Subjective  - Left leg soreness otherwise no new complaints.   Objective 138/63 81 98.7 F (37.1 C) (Oral) 15 100%  Intake/Output Summary (Last 24 hours) at 04/17/13 0736 Last data filed at 04/16/13 2300  Gross per 24 hour  Intake    670 ml  Output    250 ml  Net    420 ml    Left upper thigh no erythema.  No active bleeding. Distally left foot warm to touch.  Assessment/Planning: POD #1  Clean dry dressing applied Vancomycin during dialysis. Intraoperative cultures pending  Clinton GallantCOLLINS, EMMA [facility]MA[facility]EN 04/17/2013 7:36 AM --  Laboratory Lab Results:  Recent Labs  04/16/13 0141 04/17/13 0525  WBC 8.8 8.6  HGB 11.5* 10.1*  HCT 36.5* 31.4*  PLT 269 331   BMET  Recent Labs  04/16/13 0141  NA 133*  K 3.7  CL 91*  CO2 26  GLUCOSE 202*  BUN 22  CREATININE 4.83*  CALCIUM 8.4    COAG Lab Results  Component Value Date   INR 1.47 04/17/2013   INR 1.67* 04/16/2013   INR 3.51* 04/16/2013   No results found for this basename: PTT   Agree with above. Intraoperative culture pending. Continue intravenous antibiotics and dressing changes.  Waverly Ferrarihristopher Thedora Rings, MD, FACS Beeper [phone] 04/17/2013

## 2013-04-18 LAB — CBC
HCT: 29.9 % — ABNORMAL LOW (ref 39.0–52.0)
HCT: 30.4 % — ABNORMAL LOW (ref 39.0–52.0)
HEMOGLOBIN: 9.5 g/dL — AB (ref 13.0–17.0)
Hemoglobin: 9.6 g/dL — ABNORMAL LOW (ref 13.0–17.0)
MCH: 29.6 pg (ref 26.0–34.0)
MCH: 29.7 pg (ref 26.0–34.0)
MCHC: 31.8 g/dL (ref 30.0–36.0)
MCHC: 31.6 g/dL (ref 30.0–36.0)
MCV: 93.1 fL (ref 78.0–100.0)
MCV: 94.1 fL (ref 78.0–100.0)
PLATELETS: 371 10*3/uL (ref 150–400)
Platelets: 347 10*3/uL (ref 150–400)
RBC: 3.21 MIL/uL — AB (ref 4.22–5.81)
RBC: 3.23 MIL/uL — AB (ref 4.22–5.81)
RDW: 16.4 % — ABNORMAL HIGH (ref 11.5–15.5)
RDW: 16.7 % — ABNORMAL HIGH (ref 11.5–15.5)
WBC: 8.3 10*3/uL (ref 4.0–10.5)
WBC: 7.1 10*3/uL (ref 4.0–10.5)

## 2013-04-18 LAB — RENAL FUNCTION PANEL
Albumin: 2.4 g/dL — ABNORMAL LOW (ref 3.5–5.2)
BUN: 38 mg/dL — ABNORMAL HIGH (ref 6–23)
CALCIUM: 7.8 mg/dL — AB (ref 8.4–10.5)
CHLORIDE: 88 meq/L — AB (ref 96–112)
CO2: 25 meq/L (ref 19–32)
Creatinine, Ser: 8.06 mg/dL — ABNORMAL HIGH (ref 0.50–1.35)
GFR calc non Af Amer: 7 mL/min — ABNORMAL LOW (ref >90)
GFR, EST AFRICAN AMERICAN: 8 mL/min — AB (ref >90)
Glucose, Bld: 169 mg/dL — ABNORMAL HIGH (ref 70–99)
Phosphorus: 4.3 mg/dL (ref 2.3–4.6)
Potassium: 4.2 mEq/L (ref 3.7–5.3)
Sodium: 129 mEq/L — ABNORMAL LOW (ref 137–147)

## 2013-04-18 LAB — PROTIME-INR
INR: 1.59 — ABNORMAL HIGH (ref 0.00–1.49)
Prothrombin Time: 18.5 seconds — ABNORMAL HIGH (ref 11.6–15.2)

## 2013-04-18 LAB — T3, FREE: T3 FREE: 1.1 pg/mL — AB (ref 2.3–4.2)

## 2013-04-18 LAB — GLUCOSE, CAPILLARY
GLUCOSE-CAPILLARY: 105 mg/dL — AB (ref 70–99)
Glucose-Capillary: 188 mg/dL — ABNORMAL HIGH (ref 70–99)

## 2013-04-18 LAB — HEPARIN LEVEL (UNFRACTIONATED)
HEPARIN UNFRACTIONATED: 0.12 [IU]/mL — AB (ref 0.30–0.70)
Heparin Unfractionated: <0.1 IU/mL — ABNORMAL LOW (ref 0.30–0.70)

## 2013-04-18 LAB — T4, FREE: Free T4: 1.99 ng/dL — ABNORMAL HIGH (ref 0.80–1.80)

## 2013-04-18 LAB — VANCOMYCIN, RANDOM: VANCOMYCIN RM: 23.4 ug/mL

## 2013-04-18 MED ORDER — HEPARIN (PORCINE) IN NACL 100-0.45 UNIT/ML-% IJ SOLN
1700.0000 [IU]/h | INTRAMUSCULAR | Status: DC
  Administered 2013-04-18: 1250 [IU]/h via INTRAVENOUS
  Administered 2013-04-19 – 2013-04-20 (×3): 1700 [IU]/h via INTRAVENOUS
  Filled 2013-04-18 (×7): qty 250

## 2013-04-18 MED ORDER — DARBEPOETIN ALFA-POLYSORBATE 150 MCG/0.3ML IJ SOLN
INTRAMUSCULAR | Status: AC
  Administered 2013-04-18: 150 ug via INTRAVENOUS
  Filled 2013-04-18: qty 0.3

## 2013-04-18 MED ORDER — METOPROLOL TARTRATE 50 MG PO TABS
50.0000 mg | ORAL_TABLET | Freq: Two times a day (BID) | ORAL | Status: DC
  Administered 2013-04-18: 50 mg via ORAL
  Filled 2013-04-18 (×3): qty 1

## 2013-04-18 MED ORDER — WARFARIN SODIUM 7.5 MG PO TABS
7.5000 mg | ORAL_TABLET | Freq: Once | ORAL | Status: AC
  Administered 2013-04-18: 7.5 mg via ORAL
  Filled 2013-04-18: qty 1

## 2013-04-18 NOTE — Progress Notes (Signed)
[facility] PROGRESS NOTE  Marcus [name] ZDG:[id number] DOB: Sep 30, 1955 DOA: 04/15/2013 PCP: Galvin Proffer, MD  Assessment/Plan: 58 y.o. male with Past medical history of ESRD on hemodialysis, CVA, GI bleed, pulmonary edema, dyslipidemia, hypothyroidism, hypertension, GERD, recent admissions for graft failure admitted with graft infection   Principal Problem:  1. 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 On IV antibiotics. Awaiting for cultures; d/c atx if neg   Active Problems:  2. ESRD (end stage renal disease) on dialysis: For dialysis tomorrow. Appreciate nephrology help   3. Type I (juvenile type) diabetes mellitus without mention of complication, uncontrolled: Sugar is elevated. We'll restart Lantus   4. hypothyroidism: TSH markedly elevated and see no record of patient on Synthroid. Discharge summary approximately 6 weeks ago, patient is supposed to be on 168 mcg and given markedly elevated TSH, I suspect that he has not been taking his medications.   5. Unspecified essential hypertension: Blood pressure stable, better after dialysis   6. DVT of axillary vein, acute right: On Coumadin. INR supratherapeutic initially, reversed for surgery. Pharmacy now titrating   7. Protein-calorie malnutrition, severe: Patient meets criteria in the context of chronic illness  D/c plans per vascular, nephrology   Code Status: full Family Communication: d/w patient  (indicate person spoken with, relationship, and if by phone, the number) Disposition Plan: home per vascular, nephrology eval    Consultants:  Vascular  Nephrology   Procedures:  HD  Graft removal   Antibiotics:  vanc 12/31<<<<  Cefepime 12/31<<<<   (indicate start date, and stop date if known)  HPI/Subjective: alert  Objective: Filed Vitals:   04/18/13 1214  BP: 120/63  Pulse: 71  Temp: 97.8 F (36.6 C)  Resp: 18    Intake/Output Summary (Last 24 hours) at 04/18/13 1718 Last data  filed at 04/18/13 1151  Gross per 24 hour  Intake  221.5 ml  Output   2900 ml  Net -2678.5 ml   Filed Weights   04/17/13 2035 04/18/13 0732 04/18/13 1151  Weight: 62.869 kg (138 lb 9.6 oz) 65.1 kg (143 lb 8.3 oz) 62.2 kg (137 lb 2 oz)    Exam:   General:  alert  Cardiovascular: s1.s2 rrr  Respiratory: CTA BL  Abdomen: soft, nt, nd   Musculoskeletal: no LE edema   Data Reviewed: Basic Metabolic Panel:  Recent Labs Lab 04/16/13 0141 04/17/13 0525 04/18/13 0750  NA 133* 129* 129*  K 3.7 3.8 4.2  CL 91* 88* 88*  CO2 26 21 25   GLUCOSE 202* 195* 169*  BUN 22 32* 38*  CREATININE 4.83* 6.75* 8.06*  CALCIUM 8.4 8.0* 7.8*  PHOS 2.3  --  4.3   Liver Function Tests:  Recent Labs Lab 04/16/13 0141 04/18/13 0750  AST 9  --   ALT <5  --   ALKPHOS 95  --   BILITOT 0.3  --   PROT 9.2*  --   ALBUMIN 2.8* 2.4*   No results found for this basename: LIPASE, AMYLASE,  in the last 168 hours No results found for this basename: AMMONIA,  in the last 168 hours CBC:  Recent Labs Lab 04/16/13 0141 04/17/13 0525 04/18/13 0500  WBC 8.8 8.6 8.3  NEUTROABS 6.4  --   --   HGB 11.5* 10.1* 9.5*  HCT 36.5* 31.4* 29.9*  MCV 96.1 94.6 93.1  PLT 269 331 347   Cardiac Enzymes: No results found for this basename: CKTOTAL, CKMB, CKMBINDEX, TROPONINI,  in the last 168 hours BNP (  last 3 results) No results found for this basename: PROBNP,  in the last 8760 hours CBG:  Recent Labs Lab 04/17/13 0602 04/17/13 1131 04/17/13 1621 04/17/13 2103 04/18/13 1213  GLUCAP 209* 151* 140* 136* 105*    Recent Results (from the past 240 hour(s))  SURGICAL PCR SCREEN     Status: None   Collection Time    04/16/13 12:11 AM      Result Value Range Status   MRSA, PCR NEGATIVE  NEGATIVE Final   Staphylococcus aureus NEGATIVE  NEGATIVE Final   Comment:            The Xpert SA Assay (FDA     approved for NASAL specimens     in patients over 74 years of age),     is one component of      a comprehensive surveillance     program.  Test performance has     been validated by [employer] for patients greater     than or equal to 19 year old.     It is not intended     to diagnose infection nor to     guide or monitor treatment.  CULTURE, BLOOD (ROUTINE X 2)     Status: None   Collection Time    04/16/13  1:41 AM      Result Value Range Status   Specimen Description BLOOD LEFT ARM   Final   Special Requests BOTTLES DRAWN AEROBIC ONLY 3CC   Final   Culture  Setup Time     Final   Value: 04/16/2013 09:02     Performed at [facility]   Culture     Final   Value:        BLOOD CULTURE RECEIVED NO GROWTH TO DATE CULTURE WILL BE HELD FOR 5 DAYS BEFORE ISSUING A FINAL NEGATIVE REPORT     Performed at [facility]   Report Status PENDING   Incomplete  CULTURE, BLOOD (ROUTINE X 2)     Status: None   Collection Time    04/16/13  1:51 AM      Result Value Range Status   Specimen Description BLOOD LEFT HAND   Final   Special Requests BOTTLES DRAWN AEROBIC ONLY 2CC   Final   Culture  Setup Time     Final   Value: 04/16/2013 09:01     Performed at [facility]   Culture     Final   Value:        BLOOD CULTURE RECEIVED NO GROWTH TO DATE CULTURE WILL BE HELD FOR 5 DAYS BEFORE ISSUING A FINAL NEGATIVE REPORT     Performed at [facility]   Report Status PENDING   Incomplete  WOUND CULTURE     Status: None   Collection Time    04/16/13 11:45 AM      Result Value Range Status   Specimen Description WOUND LEFT GROIN   Final   Special Requests SWAB OF LEFT GROIN WOUND PT ON VANCPMYCIN   Final   Gram Stain     Final   Value: RARE WBC PRESENT, PREDOMINANTLY PMN     RARE SQUAMOUS EPITHELIAL CELLS PRESENT     NO ORGANISMS SEEN     Performed at [facility]   Culture     Final   Value: NO GROWTH 2 DAYS     Performed at [facility]   Report Status PENDING  Incomplete  ANAEROBIC CULTURE     Status: None   Collection Time     04/16/13 11:45 AM      Result Value Range Status   Specimen Description WOUND LEFT GROIN   Final   Special Requests SWAB ON LEFT GROIN WOUND PT ON VANCOMYCIN   Final   Gram Stain     Final   Value: RARE WBC PRESENT, PREDOMINANTLY PMN     RARE SQUAMOUS EPITHELIAL CELLS PRESENT     NO ORGANISMS SEEN     [facility]   Culture     Final   Value: NO ANAEROBES ISOLATED; CULTURE IN PROGRESS FOR 5 DAYS     [facility]   Report Status PENDING   Incomplete     Studies: No results found.  Scheduled Meds: . aspirin EC  81 mg Oral Daily  . ceFEPime (MAXIPIME) IV  2 g Intravenous Q M,W,F-HD  . darbepoetin (ARANESP) injection - DIALYSIS  150 mcg Intravenous Q Fri-HD  . ferric gluconate (FERRLECIT/NULECIT) IV  62.5 mg Intravenous Q M,W,F-HD  . FLUoxetine  5 mg Oral Daily  . insulin aspart  0-5 Units Subcutaneous QHS  . insulin aspart  0-9 Units Subcutaneous TID WC  . insulin glargine  5 Units Subcutaneous QHS  . levothyroxine  168 mcg Oral QAC breakfast  . metoprolol  50 mg Oral BID  . multivitamin  1 tablet Oral QHS  . sodium chloride  3 mL Intravenous Q12H  . vancomycin  500 mg Intravenous Q M,W,F-HD  . warfarin  7.5 mg Oral ONCE-1800  . Warfarin - Pharmacist Dosing Inpatient   Does not apply q1800   Continuous Infusions: . heparin 1,250 Units/hr (04/18/13 1345)    Principal Problem:   Vascular graft infection Active Problems:   ESRD (end stage renal disease) on dialysis   Type I (juvenile type) diabetes mellitus without mention of complication, uncontrolled   Unspecified hypothyroidism   Unspecified essential hypertension   DVT of axillary vein, acute right   Protein-calorie malnutrition, severe   Diabetes mellitus    Time spent: >35 minutes     Esperanza SheetsBURIEV, Beth Goodlin N  [facility] Pager [phone]. If 7PM-7AM, please contact night-coverage at [email or web address], password [email or web address] 04/18/2013, 5:18 PM  LOS: 3 days

## 2013-04-18 NOTE — Progress Notes (Signed)
Postoperative day #2, status post removal of left femoral Gore-Tex graft  The patient reports that his pain is much better today.  He continues to undergo dressing changes.  The patient is currently in dialysis.  I did not do a dressing change today but I did evaluate his wounds.  There is continuous drainage which is not expected.  Continue with IV antibiotics.  Cultures so far have not grown anything.  Also continue with dressing changes  Wells Brabham

## 2013-04-18 NOTE — Progress Notes (Signed)
ANTIBIOTIC CONSULT NOTE - FOLLOW UP  Pharmacy Consult for Vancomycin, cefepime Indication: infected AVG, s/p removal  Allergies  Allergen Reactions  . Penicillins Other (See Comments)    Unknown reaction    Patient Measurements: Height: 6\' 2"  (188 cm) Weight: 137 lb 2 oz (62.2 kg) IBW/kg (Calculated) : 82.2 Adjusted Body Weight: n/a  Vital Signs: Temp: 97.8 F (36.6 C) (01/02 1214) Temp src: Oral (01/02 1151) BP: 120/63 mmHg (01/02 1214) Pulse Rate: 71 (01/02 1214) Intake/Output from previous day: 01/01 0701 - 01/02 0700 In: 708.1 [P.O.:440; I.V.:38.1] Out: 0  Intake/Output from this shift: Total I/O In: 0  Out: 2900 [Other:2900]  Labs:  Recent Labs  04/16/13 0141 04/17/13 0525 04/18/13 0500 04/18/13 0750  WBC 8.8 8.6 8.3  --   HGB 11.5* 10.1* 9.5*  --   PLT 269 331 347  --   CREATININE 4.83* 6.75*  --  8.06*   Estimated Creatinine Clearance: 8.9 ml/min (by C-G formula based on Cr of 8.06).  Recent Labs  04/18/13 0500  VANCORANDOM 23.4     Microbiology: Recent Results (from the past 720 hour(s))  SURGICAL PCR SCREEN     Status: None   Collection Time    04/16/13 12:11 AM      Result Value Range Status   MRSA, PCR NEGATIVE  NEGATIVE Final   Staphylococcus aureus NEGATIVE  NEGATIVE Final   Comment:            The Xpert SA Assay (FDA     approved for NASAL specimens     in patients over 22 years of age),     is one component of     a comprehensive surveillance     program.  Test performance has     been validated by [employer] for patients greater     than or equal to 72 year old.     It is not intended     to diagnose infection nor to     guide or monitor treatment.  CULTURE, BLOOD (ROUTINE X 2)     Status: None   Collection Time    04/16/13  1:41 AM      Result Value Range Status   Specimen Description BLOOD LEFT ARM   Final   Special Requests BOTTLES DRAWN AEROBIC ONLY 3CC   Final   Culture  Setup Time     Final   Value: 04/16/2013  09:02     Performed at [facility]   Culture     Final   Value:        BLOOD CULTURE RECEIVED NO GROWTH TO DATE CULTURE WILL BE HELD FOR 5 DAYS BEFORE ISSUING A FINAL NEGATIVE REPORT     Performed at [facility]   Report Status PENDING   Incomplete  CULTURE, BLOOD (ROUTINE X 2)     Status: None   Collection Time    04/16/13  1:51 AM      Result Value Range Status   Specimen Description BLOOD LEFT HAND   Final   Special Requests BOTTLES DRAWN AEROBIC ONLY 2CC   Final   Culture  Setup Time     Final   Value: 04/16/2013 09:01     Performed at [facility]   Culture     Final   Value:        BLOOD CULTURE RECEIVED NO GROWTH TO DATE CULTURE WILL BE HELD FOR 5 DAYS BEFORE ISSUING  A FINAL NEGATIVE REPORT     [facility]   Report Status PENDING   Incomplete  WOUND CULTURE     Status: None   Collection Time    04/16/13 11:45 AM      Result Value Range Status   Specimen Description WOUND LEFT GROIN   Final   Special Requests SWAB OF LEFT GROIN WOUND PT ON VANCPMYCIN   Final   Gram Stain     Final   Value: RARE WBC PRESENT, PREDOMINANTLY PMN     RARE SQUAMOUS EPITHELIAL CELLS PRESENT     NO ORGANISMS SEEN     [facility]   Culture     Final   Value: NO GROWTH 2 DAYS     [facility]   Report Status PENDING   Incomplete  ANAEROBIC CULTURE     Status: None   Collection Time    04/16/13 11:45 AM      Result Value Range Status   Specimen Description WOUND LEFT GROIN   Final   Special Requests SWAB ON LEFT GROIN WOUND PT ON VANCOMYCIN   Final   Gram Stain     Final   Value: RARE WBC PRESENT, PREDOMINANTLY PMN     RARE SQUAMOUS EPITHELIAL CELLS PRESENT     NO ORGANISMS SEEN     [facility]   Culture     Final   Value: NO ANAEROBES ISOLATED; CULTURE IN PROGRESS FOR 5 DAYS     [facility]   Report Status PENDING   Incomplete    Anti-infectives   Start      Dose/Rate Route Frequency Ordered Stop   04/17/13 0000  vancomycin (VANCOCIN) IVPB 1000 mg/200 mL premix  Status:  Discontinued     1,000 mg 200 mL/hr over 60 Minutes Intravenous To Surgery 04/16/13 0832 04/16/13 0838   04/16/13 1200  vancomycin (VANCOCIN) 500 mg in sodium chloride 0.9 % 100 mL IVPB     500 mg 100 mL/hr over 60 Minutes Intravenous Every M-W-F (Hemodialysis) 04/16/13 0049     04/16/13 1130  vancomycin (VANCOCIN) IVPB 1000 mg/200 mL premix  Status:  Discontinued     1,000 mg 200 mL/hr over 60 Minutes Intravenous  Once 04/16/13 1117 04/16/13 1505   04/16/13 0130  ceFEPIme (MAXIPIME) 2 g in dextrose 5 % 50 mL IVPB     2 g 100 mL/hr over 30 Minutes Intravenous Every M-W-F (Hemodialysis) 04/16/13 0049        Assessment: 58 yo male on empiric vancomycin and cefepime for infected left femoral graft, now s/p OR for removal.  Vancomycin pre-HD level at goal today.  On vancomycin 500 mg q HD and cefepime 2g q HD.  Doses given appropriately at end of HD today.    12/31 Wound culture> ngtd 12/31 BCx x 2 > ngtd  Goal of Therapy:  Pre-HD vancomycin level 15-25  Plan:  1. Continue vancomycin and cefepime at current doses. 2. Will f/u for planned LOT.  Tad MooreJessica Lilia Letterman, Pharm D, BCPS  Clinical Pharmacist Pager [phone]  04/18/2013 1:41 PM

## 2013-04-18 NOTE — Progress Notes (Signed)
ANTICOAGULATION CONSULT NOTE - Follow Up Consult  Pharmacy Consult for Heparin and Coumadin Indication: hx DVT right UE (02/2013)  Allergies  Allergen Reactions  . Penicillins Other (See Comments)    Unknown reaction    Patient Measurements: Height: 6\' 2"  (188 cm) Weight: 135 lb 4.8 oz (61.372 kg) IBW/kg (Calculated) : 82.2 Heparin Dosing Weight: 61.4 kg  Vital Signs: Temp: 98.5 F (36.9 C) (01/02 2103) Temp src: Oral (01/02 2103) BP: 143/68 mmHg (01/02 2103) Pulse Rate: 78 (01/02 2103)  Labs:  Recent Labs  04/16/13 0141 04/16/13 0945 04/17/13 0525 04/17/13 1940 04/18/13 0500 04/18/13 0750 04/18/13 1906 04/18/13 1946  HGB 11.5*  --  10.1*  --  9.5*  --   --  9.6*  HCT 36.5*  --  31.4*  --  29.9*  --   --  30.4*  PLT 269  --  331  --  347  --   --  371  APTT 67*  --   --   --   --   --   --   --   LABPROT 33.9* 19.2* 17.4*  --  18.5*  --   --   --   INR 3.51* 1.67* 1.47  --  1.59*  --   --   --   HEPARINUNFRC  --   --   --  <0.10* 0.12*  --  <0.10*  --   CREATININE 4.83*  --  6.75*  --   --  8.06*  --   --     Estimated Creatinine Clearance: 8.8 ml/min (by C-G formula based on Cr of 8.06).  Assessment:   Heparin level is undetectable tonight on 1250 units/hr.  No known infusion problems.  Coumadin 7.5 mg given tonight.  Goal of Therapy:  INR 2-3 Heparin level 0.3-0.7 units/ml Monitor platelets by anticoagulation protocol: Yes   Plan:   Increase heparin drip to 1450 units/hr.  Next heparin level, CBC and PT/INR in am.  Dennie FettersEgan, Aaliayah Miao Donovan, RPh Pager: [phone] 04/18/2013,9:34 PM

## 2013-04-18 NOTE — Progress Notes (Signed)
  [facility] Progress Note    Subjective: Feels much better, wound cxs and bcx's NGTD   Exam  Blood pressure 113/62, pulse 67, temperature 98.5 F (36.9 C), temperature source Oral, resp. rate 13, height 6\' 2"  (1.88 m), weight 65.1 kg (143 lb 8.3 oz), SpO2 99.00%. Alert, looks better today RRR, no rub or mur  Clear to A/P bilat Abd soft, +bs, nontender  No pedal edema / Left groin dressing dry and clean / R arm 2+ edema Access: right groin perm cath  Dialysis: [facility] MWF 4:15h    60kg    2K/2.25Ca Bath   Heparin 2400  R fem permcath   BFR 400 Hectorol none    Epogen 18000     Venofer 50mg /wk Other Labs op= 56 pth, hgb 10.4<9.8 TFS 38%   Assessment/Plan  1. Removal L thigh infected AVG: cultures all neg, on empiric abx 2. ESRD: multiple access failures, using R fem cath. HD today 3. HTN/volume: up 5kg today, BP's low normal > have decreased BP meds, increase UF goal today 4. Anemia - continue ESA/ iron weekly 5. Metabolic bone disease - No vit . Hold renvela binder with phos 2.3 6. Recent RUE DVT( axillary and jugular vein) - was on Coumadin With admit INR 3.51 7. DM type 1 - per admit team. 8. Ho CVA   Vinson Moselleob Lahoma Constantin MD  pager [phone]    cell [phone]  04/18/2013, 10:35 AM   Recent Labs Lab 04/16/13 0141 04/17/13 0525 04/18/13 0750  NA 133* 129* 129*  K 3.7 3.8 4.2  CL 91* 88* 88*  CO2 26 21 25   GLUCOSE 202* 195* 169*  BUN 22 32* 38*  CREATININE 4.83* 6.75* 8.06*  CALCIUM 8.4 8.0* 7.8*  PHOS 2.3  --  4.3    Recent Labs Lab 04/16/13 0141 04/18/13 0750  AST 9  --   ALT <5  --   ALKPHOS 95  --   BILITOT 0.3  --   PROT 9.2*  --   ALBUMIN 2.8* 2.4*    Recent Labs Lab 04/16/13 0141 04/17/13 0525 04/18/13 0500  WBC 8.8 8.6 8.3  NEUTROABS 6.4  --   --   HGB 11.5* 10.1* 9.5*  HCT 36.5* 31.4* 29.9*  MCV 96.1 94.6 93.1  PLT 269 331 347   . amLODipine  10 mg Oral QHS  . aspirin EC  81 mg Oral Daily  . ceFEPime (MAXIPIME) IV  2 g  Intravenous Q M,W,F-HD  . cloNIDine  0.1 mg Oral TID  . darbepoetin (ARANESP) injection - DIALYSIS  150 mcg Intravenous Q Fri-HD  . ferric gluconate (FERRLECIT/NULECIT) IV  62.5 mg Intravenous Q M,W,F-HD  . FLUoxetine  5 mg Oral Daily  . insulin aspart  0-5 Units Subcutaneous QHS  . insulin aspart  0-9 Units Subcutaneous TID WC  . insulin glargine  5 Units Subcutaneous QHS  . levothyroxine  168 mcg Oral QAC breakfast  . metoprolol  75 mg Oral BID  . multivitamin  1 tablet Oral QHS  . sodium chloride  3 mL Intravenous Q12H  . vancomycin  500 mg Intravenous Q M,W,F-HD  . Warfarin - Pharmacist Dosing Inpatient   Does not apply q1800   . heparin     HYDROcodone-acetaminophen, HYDROmorphone (DILAUDID) injection, ondansetron (ZOFRAN) IV, ondansetron

## 2013-04-18 NOTE — Procedures (Signed)
I was present at this dialysis session, have reviewed the session itself and made  appropriate changes  Rob Ruth Kovich MD (pgr) [phone]    (c) [phone] 04/18/2013, 10:20 AM   

## 2013-04-18 NOTE — Progress Notes (Signed)
ANTICOAGULATION CONSULT NOTE - Follow Up Consult  Pharmacy Consult for heparin/Coumadin Indication: Hx DVT  Allergies  Allergen Reactions  . Penicillins Other (See Comments)    Unknown reaction    Patient Measurements: Height: 6\' 2"  (188 cm) Weight: 137 lb 2 oz (62.2 kg) IBW/kg (Calculated) : 82.2 Heparin Dosing Weight: 62.9 kg  Vital Signs: Temp: 97.8 F (36.6 C) (01/02 1214) Temp src: Oral (01/02 1151) BP: 120/63 mmHg (01/02 1214) Pulse Rate: 71 (01/02 1214)  Labs:  Recent Labs  04/16/13 0141 04/16/13 0945 04/17/13 0525 04/17/13 1940 04/18/13 0500 04/18/13 0750  HGB 11.5*  --  10.1*  --  9.5*  --   HCT 36.5*  --  31.4*  --  29.9*  --   PLT 269  --  331  --  347  --   APTT 67*  --   --   --   --   --   LABPROT 33.9* 19.2* 17.4*  --  18.5*  --   INR 3.51* 1.67* 1.47  --  1.59*  --   HEPARINUNFRC  --   --   --  <0.10* 0.12*  --   CREATININE 4.83*  --  6.75*  --   --  8.06*    Estimated Creatinine Clearance: 8.9 ml/min (by C-G formula based on Cr of 8.06).   Medications:  Infusions:  . heparin  900 units/hr    Assessment: 58 yo male on chronic Coumadin for hx DVT s/p OR for removal of infected dialysis graft.  Heparin and Coumadin resumed 12/31.  Heparin level remains low despite rate increases.  INR rising after 1 dose of Coumadin.  No bleeding or complications noted per chart notes.    Goal of Therapy:  Heparin level 0.3-0.7 units/ml Monitor platelets by anticoagulation protocol: Yes   Plan:  1. Increase IV heparin to 1250 units/hr (done while patient in HD). 2. Recheck heparin level in 8 hrs. 3. Coumadin 7.5 mg po x 1 tonight. 4. Continue daily heparin level, CBC, and INR.  Tad MooreJessica Cagney Degrace, Pharm D, BCPS  Clinical Pharmacist Pager [phone]  04/18/2013 1:23 PM

## 2013-04-19 DIAGNOSIS — E43 Unspecified severe protein-calorie malnutrition: Secondary | ICD-10-CM

## 2013-04-19 LAB — GLUCOSE, CAPILLARY
GLUCOSE-CAPILLARY: 222 mg/dL — AB (ref 70–99)
GLUCOSE-CAPILLARY: 51 mg/dL — AB (ref 70–99)
GLUCOSE-CAPILLARY: 188 mg/dL — AB (ref 70–99)
Glucose-Capillary: 171 mg/dL — ABNORMAL HIGH (ref 70–99)
Glucose-Capillary: 54 mg/dL — ABNORMAL LOW (ref 70–99)
Glucose-Capillary: 57 mg/dL — ABNORMAL LOW (ref 70–99)
Glucose-Capillary: 70 mg/dL (ref 70–99)
Glucose-Capillary: 160 mg/dL — ABNORMAL HIGH (ref 70–99)

## 2013-04-19 LAB — PROTIME-INR
INR: 1.61 — AB (ref 0.00–1.49)
Prothrombin Time: 18.7 seconds — ABNORMAL HIGH (ref 11.6–15.2)

## 2013-04-19 LAB — CBC
HCT: 29.8 % — ABNORMAL LOW (ref 39.0–52.0)
HEMOGLOBIN: 9.4 g/dL — AB (ref 13.0–17.0)
MCH: 29.7 pg (ref 26.0–34.0)
MCHC: 31.5 g/dL (ref 30.0–36.0)
MCV: 94 fL (ref 78.0–100.0)
Platelets: 362 10*3/uL (ref 150–400)
RBC: 3.17 MIL/uL — ABNORMAL LOW (ref 4.22–5.81)
RDW: 16.6 % — AB (ref 11.5–15.5)
WBC: 7.8 10*3/uL (ref 4.0–10.5)

## 2013-04-19 LAB — WOUND CULTURE: Culture: NO GROWTH

## 2013-04-19 LAB — HEPARIN LEVEL (UNFRACTIONATED)
Heparin Unfractionated: 0.17 IU/mL — ABNORMAL LOW (ref 0.30–0.70)
Heparin Unfractionated: 0.33 IU/mL (ref 0.30–0.70)

## 2013-04-19 MED ORDER — AMLODIPINE BESYLATE 10 MG PO TABS
10.0000 mg | ORAL_TABLET | Freq: Every day | ORAL | Status: DC
  Administered 2013-04-19 – 2013-04-21 (×3): 10 mg via ORAL
  Filled 2013-04-19 (×3): qty 1

## 2013-04-19 MED ORDER — WARFARIN SODIUM 7.5 MG PO TABS
7.5000 mg | ORAL_TABLET | Freq: Once | ORAL | Status: AC
  Administered 2013-04-19: 7.5 mg via ORAL
  Filled 2013-04-19: qty 1

## 2013-04-19 MED ORDER — SENNOSIDES-DOCUSATE SODIUM 8.6-50 MG PO TABS
1.0000 | ORAL_TABLET | Freq: Two times a day (BID) | ORAL | Status: DC
Start: 2013-04-19 — End: 2013-04-22
  Administered 2013-04-19 – 2013-04-20 (×4): 1 via ORAL
  Filled 2013-04-19 (×6): qty 1

## 2013-04-19 MED ORDER — METOPROLOL TARTRATE 50 MG PO TABS
75.0000 mg | ORAL_TABLET | Freq: Two times a day (BID) | ORAL | Status: DC
  Administered 2013-04-19 – 2013-04-21 (×5): 75 mg via ORAL
  Filled 2013-04-19 (×6): qty 1

## 2013-04-19 MED ORDER — CLONIDINE HCL 0.1 MG PO TABS
0.1000 mg | ORAL_TABLET | Freq: Two times a day (BID) | ORAL | Status: DC
  Administered 2013-04-19 – 2013-04-20 (×3): 0.1 mg via ORAL
  Filled 2013-04-19 (×2): qty 1

## 2013-04-19 NOTE — Progress Notes (Addendum)
[facility] [place]  Subjective  - Doing better with the incisional pain.   Objective 176/86 79 98.4 F (36.9 C) (Oral) 18 98%  Intake/Output Summary (Last 24 hours) at 04/19/13 0950 Last data filed at 04/19/13 16100638  Gross per 24 hour  Intake 682.34 ml  Output   2900 ml  Net -2217.66 ml    Left thigh incisions with min. Bloody drainage.  No frank purulent drainage. No hematomas Stump in warm  Assessment/Planning: POD #3 status post removal of left femoral Gore-Tex graft Final intraoperative cultures pending.  No growth to date. Dry dressing daily to incisions  Thomasena EdisCOLLINS, [facility]endEMMA MAUREEN 04/19/2013 9:50 AM --  Laboratory Lab Results:  Recent Labs  04/18/13 1946 04/19/13 0407  WBC 7.1 7.8  HGB 9.6* 9.4*  HCT 30.4* 29.8*  PLT 371 362   BMET  Recent Labs  04/17/13 0525 04/18/13 0750  NA 129* 129*  K 3.8 4.2  CL 88* 88*  CO2 21 25  GLUCOSE 195* 169*  BUN 32* 38*  CREATININE 6.75* 8.06*  CALCIUM 8.0* 7.8*    COAG Lab Results  Component Value Date   INR 1.61* 04/19/2013   INR 1.59* 04/18/2013   INR 1.47 04/17/2013   No results found for this basename: PTT    Agree with above assessment Some serous drainage from left leg wound Cultures pending   Probable DC home Monday following his hemodialysis session if pain under control

## 2013-04-19 NOTE — Progress Notes (Signed)
Page to Marcus [name] Removed fistula in left thigh is bleeding. Applying pressure and adding new dressing. Pt.is infusing Heparin, notification to pharmacy as well. Return call from [name]. To call vascular for notification as well.

## 2013-04-19 NOTE — Progress Notes (Signed)
Page to Dr. Hart RochesterLawson with call back for notification of slow bleed from removed Fistula. To place ace wrap for compression, MD to assess in the am. Will continue monitoring for continued bleeding. Dondra SpryMoore, Yetta Marceaux Islee, RN

## 2013-04-19 NOTE — Progress Notes (Signed)
ANTICOAGULATION CONSULT NOTE - Follow Up Consult  Pharmacy Consult for Heparin and Coumadin Indication: hx DVT right UE (02/2013)  Allergies  Allergen Reactions  . Penicillins Other (See Comments)    Unknown reaction    Patient Measurements: Height: 6\' 2"  (188 cm) Weight: 135 lb 4.8 oz (61.372 kg) IBW/kg (Calculated) : 82.2 Heparin Dosing Weight: 61.4 kg  Vital Signs: Temp: 99.1 F (37.3 C) (01/03 1206) Temp src: Oral (01/03 1206) BP: 183/89 mmHg (01/03 1206) Pulse Rate: 64 (01/03 1206)  Labs:  Recent Labs  04/17/13 0525  04/18/13 0500 04/18/13 0750 04/18/13 1906 04/18/13 1946 04/19/13 0407 04/19/13 1420  HGB 10.1*  --  9.5*  --   --  9.6* 9.4*  --   HCT 31.4*  --  29.9*  --   --  30.4* 29.8*  --   PLT 331  --  347  --   --  371 362  --   LABPROT 17.4*  --  18.5*  --   --   --  18.7*  --   INR 1.47  --  1.59*  --   --   --  1.61*  --   HEPARINUNFRC  --   < > 0.12*  --  <0.10*  --  0.17* 0.33  CREATININE 6.75*  --   --  8.06*  --   --   --   --   < > = values in this interval not displayed.  Estimated Creatinine Clearance: 8.8 ml/min (by C-G formula based on Cr of 8.06).  Assessment: 58 yo male on chronic Coumadin for hx DVT s/p OR for removal of infected dialysis graft. Heparin and Coumadin resumed 12/31.  Heparin level is therapeutic (0.33) now on 1700 units/hr.  No known infusion problems.   INR this am remains SUBtherapeutic at 1.61 after 2 doses of coumadin (of note, pt INR on admit was SUPRAtherapeutic and was reversed with Vit K for surgery). H/H slightly trending down 11.5>10.1>9.4, plt wnl. No bleeding or complications noted per chart notes.   Home dose: Coumadin 7.5 mg PO daily  Goal of Therapy:  INR 2-3 Heparin level 0.3-0.7 units/ml Monitor platelets by anticoagulation protocol: Yes   Plan:  - Cont heparin gtt at 1700 units/hr - Coumadin 7.5 mg PO x 1 tonight - Monitor daily HL, PT/INR, CBC, s/s bleeding  Margie BilletErika K. von Vajna, PharmD Clinical  Pharmacist - Resident Pager: [phone] Pharmacy: [phone] 04/19/2013 3:45 PM

## 2013-04-19 NOTE — Progress Notes (Signed)
ANTICOAGULATION CONSULT NOTE - Follow Up Consult  Pharmacy Consult for heparin Indication: DVT 02/2012 w/ low INR  Labs:  Recent Labs  04/17/13 0525  04/18/13 0500 04/18/13 0750 04/18/13 1906 04/18/13 1946 04/19/13 0407  HGB 10.1*  --  9.5*  --   --  9.6* 9.4*  HCT 31.4*  --  29.9*  --   --  30.4* 29.8*  PLT 331  --  347  --   --  371 362  LABPROT 17.4*  --  18.5*  --   --   --  18.7*  INR 1.47  --  1.59*  --   --   --  1.61*  HEPARINUNFRC  --   < > 0.12*  --  <0.10*  --  0.17*  CREATININE 6.75*  --   --  8.06*  --   --   --   < > = values in this interval not displayed.   Assessment: 58yo male remains subtherapeutic on heparin despite rate increases; on recent admission pt required rates to 1700-1750 units/hr.  Goal of Therapy:  Heparin level 0.3-0.7 units/ml   Plan:  Will increase heparin gtt by 4 units/kg/hr to 1700 units/hr and check level in 8hr.  Vernard GamblesVeronda Matrice Herro, PharmD, BCPS  04/19/2013,5:34 AM

## 2013-04-19 NOTE — Progress Notes (Signed)
[facility] Progress Note   Subjective: Feeling ok, no new complaints  Filed Vitals:   04/18/13 1214 04/18/13 2103 04/19/13 0458 04/19/13 0737  BP: 120/63 143/68 160/80 176/86  Pulse: 71 78 72 79  Temp: 97.8 F (36.6 C) 98.5 F (36.9 C) 98.8 F (37.1 C) 98.4 F (36.9 C)  TempSrc: Oral Oral Oral Oral  Resp: 18 17 17 18   Height:      Weight:  61.372 kg (135 lb 4.8 oz)    SpO2: 100% 99% 99% 98%  Exam Alert, looks better RRR, no rub or mur  Clear to A/P bilat  Abd soft, +bs, nontender  No pedal edema / Left thigh indurated, erythematous and tender / R arm 2+ edema  Access: right groin perm cath  Dialysis: [facility] MWF  4:15h   60kg   2K/2.25Ca Bath   Heparin 2400   R fem permcath   BFR 400  Hectorol none Epogen 18000 Venofer 50mg /wk  Other Labs op= 56 pth, hgb 10.4<9.8 TFS 38%  Assessment/Plan  1. Removal L thigh infected AVG: cultures all neg, on empiric abx, per VVS 2. ESRD: multiple access failures, using R fem cath. HD Monday 3. HTN/volume: volume excess better, only 2kg up today, BP back up, resume norvasc and MTP at home dose  4. Anemia - continue ESA/ iron weekly 5. Metabolic bone disease - No vit . Hold renvela binder with phos 2.3 6. Recent RUE DVT( axillary and jugular vein) - on coumadin 7. DM type 1 - per admit team 8. Hx CVA  [name] MD pager [phone]    cell [phone] 04/19/2013, 10:35 AM   Recent Labs Lab 04/16/13 0141 04/17/13 0525 04/18/13 0750  NA 133* 129* 129*  K 3.7 3.8 4.2  CL 91* 88* 88*  CO2 26 21 25   GLUCOSE 202* 195* 169*  BUN 22 32* 38*  CREATININE 4.83* 6.75* 8.06*  CALCIUM 8.4 8.0* 7.8*  PHOS 2.3  --  4.3    Recent Labs Lab 04/16/13 0141 04/18/13 0750  AST 9  --   ALT <5  --   ALKPHOS 95  --   BILITOT 0.3  --   PROT 9.2*  --   ALBUMIN 2.8* 2.4*    Recent Labs Lab 04/16/13 0141  04/18/13 0500 04/18/13 1946 04/19/13 0407  WBC 8.8  < > 8.3 7.1 7.8  NEUTROABS 6.4  --   --   --   --   HGB 11.5*  <  > 9.5* 9.6* 9.4*  HCT 36.5*  < > 29.9* 30.4* 29.8*  MCV 96.1  < > 93.1 94.1 94.0  PLT 269  < > 347 371 362  < > = values in this interval not displayed. Marland Kitchen aspirin EC  81 mg Oral Daily  . ceFEPime (MAXIPIME) IV  2 g Intravenous Q M,W,F-HD  . darbepoetin (ARANESP) injection - DIALYSIS  150 mcg Intravenous Q Fri-HD  . ferric gluconate (FERRLECIT/NULECIT) IV  62.5 mg Intravenous Q M,W,F-HD  . FLUoxetine  5 mg Oral Daily  . insulin aspart  0-5 Units Subcutaneous QHS  . insulin aspart  0-9 Units Subcutaneous TID WC  . insulin glargine  5 Units Subcutaneous QHS  . levothyroxine  168 mcg Oral QAC breakfast  . metoprolol  50 mg Oral BID  . multivitamin  1 tablet Oral QHS  . sodium chloride  3 mL Intravenous Q12H  . vancomycin  500 mg Intravenous Q M,W,F-HD  . Warfarin - Pharmacist Dosing Inpatient  Does not apply q1800   . heparin 1,700 Units/hr (04/19/13 0847)   HYDROcodone-acetaminophen, HYDROmorphone (DILAUDID) injection, ondansetron (ZOFRAN) IV, ondansetron

## 2013-04-19 NOTE — Progress Notes (Signed)
[facility] PROGRESS NOTE  Marcus JacksonCharles [name] ZOX:[id number] DOB: 07/06/1955 DOA: 04/15/2013 PCP: Galvin ProfferHAGUE, Marcus P, MD  Assessment/Plan: 58 y.o. male with Past medical history of ESRD on hemodialysis, CVA, GI bleed, pulmonary edema, dyslipidemia, hypothyroidism, hypertension, GERD, recent admissions for graft failure admitted with graft infection   Principal Problem:  1. 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 On IV antibiotics empiric. Neg cultures; defer atx to VVS  Active Problems:  2. ESRD (end stage renal disease) on dialysis: HD per nephrology; . Appreciate nephrology help   3. Type I (juvenile type) diabetes mellitus without mention of complication, uncontrolled: hold Lantus due to hypoglycemia; cont ISS: reassess daily   4. hypothyroidism: TSH markedly elevated and see no record of patient on Synthroid. Discharge summary approximately 6 weeks ago, patient is supposed to be on 168 mcg and given markedly elevated TSH, I suspect that he has not been taking his medications.   5. Unspecified essential hypertension: resume amlodipine, increased BB, resume clonidine    6. DVT of axillary vein, acute right: On Coumadin. INR supratherapeutic initially, reversed for surgery. Pharmacy now titrating   7. Protein-calorie malnutrition, severe: Patient meets criteria in the context of chronic illness  Obtain PT/OT eval;   D/c plans per vascular, nephrology likely on Monday per VVS  Code Status: full Family Communication: d/w patient  (indicate person spoken with, relationship, and if by phone, the number) Disposition Plan: home per vascular, nephrology eval    Consultants:  Vascular  Nephrology   Procedures:  HD  Graft removal   Antibiotics:  vanc 12/31<<<<  Cefepime 12/31<<<<   (indicate start date, and stop date if known)  HPI/Subjective: alert  Objective: Filed Vitals:   04/19/13 1206  BP: 183/89  Pulse: 64  Temp: 99.1 F (37.3 C)  Resp:  18    Intake/Output Summary (Last 24 hours) at 04/19/13 1303 Last data filed at 04/19/13 1207  Gross per 24 hour  Intake 1042.34 ml  Output      0 ml  Net 1042.34 ml   Filed Weights   04/18/13 0732 04/18/13 1151 04/18/13 2103  Weight: 65.1 kg (143 lb 8.3 oz) 62.2 kg (137 lb 2 oz) 61.372 kg (135 lb 4.8 oz)    Exam:   General:  alert  Cardiovascular: s1.s2 rrr  Respiratory: CTA BL  Abdomen: soft, nt, nd   Musculoskeletal: no LE edema   Data Reviewed: Basic Metabolic Panel:  Recent Labs Lab 04/16/13 0141 04/17/13 0525 04/18/13 0750  NA 133* 129* 129*  K 3.7 3.8 4.2  CL 91* 88* 88*  CO2 26 21 25   GLUCOSE 202* 195* 169*  BUN 22 32* 38*  CREATININE 4.83* 6.75* 8.06*  CALCIUM 8.4 8.0* 7.8*  PHOS 2.3  --  4.3   Liver Function Tests:  Recent Labs Lab 04/16/13 0141 04/18/13 0750  AST 9  --   ALT <5  --   ALKPHOS 95  --   BILITOT 0.3  --   PROT 9.2*  --   ALBUMIN 2.8* 2.4*   No results found for this basename: LIPASE, AMYLASE,  in the last 168 hours No results found for this basename: AMMONIA,  in the last 168 hours CBC:  Recent Labs Lab 04/16/13 0141 04/17/13 0525 04/18/13 0500 04/18/13 1946 04/19/13 0407  WBC 8.8 8.6 8.3 7.1 7.8  NEUTROABS 6.4  --   --   --   --   HGB 11.5* 10.1* 9.5* 9.6* 9.4*  HCT 36.5* 31.4* 29.9* 30.4*  29.8*  MCV 96.1 94.6 93.1 94.1 94.0  PLT 269 331 347 371 362   Cardiac Enzymes: No results found for this basename: CKTOTAL, CKMB, CKMBINDEX, TROPONINI,  in the last 168 hours BNP (last 3 results) No results found for this basename: PROBNP,  in the last 8760 hours CBG:  Recent Labs Lab 04/18/13 1213 04/18/13 1714 04/18/13 2105 04/19/13 0736 04/19/13 1205  GLUCAP 105* 188* 222* 171* 51*    Recent Results (from the past 240 hour(s))  SURGICAL PCR SCREEN     Status: None   Collection Time    04/16/13 12:11 AM      Result Value Range Status   MRSA, PCR NEGATIVE  NEGATIVE Final   Staphylococcus aureus NEGATIVE   NEGATIVE Final   Comment:            The Xpert SA Assay (FDA     approved for NASAL specimens     in patients over 3 years of age),     is one component of     a comprehensive surveillance     program.  Test performance has     been validated by [employer] for patients greater     than or equal to 28 year old.     It is not intended     to diagnose infection nor to     guide or monitor treatment.  CULTURE, BLOOD (ROUTINE X 2)     Status: None   Collection Time    04/16/13  1:41 AM      Result Value Range Status   Specimen Description BLOOD LEFT ARM   Final   Special Requests BOTTLES DRAWN AEROBIC ONLY 3CC   Final   Culture  Setup Time     Final   Value: 04/16/2013 09:02     Performed at [facility]   Culture     Final   Value:        BLOOD CULTURE RECEIVED NO GROWTH TO DATE CULTURE WILL BE HELD FOR 5 DAYS BEFORE ISSUING A FINAL NEGATIVE REPORT     Performed at [facility]   Report Status PENDING   Incomplete  CULTURE, BLOOD (ROUTINE X 2)     Status: None   Collection Time    04/16/13  1:51 AM      Result Value Range Status   Specimen Description BLOOD LEFT HAND   Final   Special Requests BOTTLES DRAWN AEROBIC ONLY 2CC   Final   Culture  Setup Time     Final   Value: 04/16/2013 09:01     Performed at [facility]   Culture     Final   Value:        BLOOD CULTURE RECEIVED NO GROWTH TO DATE CULTURE WILL BE HELD FOR 5 DAYS BEFORE ISSUING A FINAL NEGATIVE REPORT     Performed at [facility]   Report Status PENDING   Incomplete  WOUND CULTURE     Status: None   Collection Time    04/16/13 11:45 AM      Result Value Range Status   Specimen Description WOUND LEFT GROIN   Final   Special Requests SWAB OF LEFT GROIN WOUND PT ON VANCPMYCIN   Final   Gram Stain     Final   Value: RARE WBC PRESENT, PREDOMINANTLY PMN     RARE SQUAMOUS EPITHELIAL CELLS PRESENT     NO ORGANISMS SEEN  Performed at [facility]     Final    Value: NO GROWTH 2 DAYS     Performed at [facility]   Report Status 04/19/2013 FINAL   Final  ANAEROBIC CULTURE     Status: None   Collection Time    04/16/13 11:45 AM      Result Value Range Status   Specimen Description WOUND LEFT GROIN   Final   Special Requests SWAB ON LEFT GROIN WOUND PT ON VANCOMYCIN   Final   Gram Stain     Final   Value: RARE WBC PRESENT, PREDOMINANTLY PMN     RARE SQUAMOUS EPITHELIAL CELLS PRESENT     NO ORGANISMS SEEN     Performed at [facility]   Culture     Final   Value: NO ANAEROBES ISOLATED; CULTURE IN PROGRESS FOR 5 DAYS     Performed at [facility]   Report Status PENDING   Incomplete     Studies: No results found.  Scheduled Meds: . amLODipine  10 mg Oral Daily  . aspirin EC  81 mg Oral Daily  . ceFEPime (MAXIPIME) IV  2 g Intravenous Q M,W,F-HD  . darbepoetin (ARANESP) injection - DIALYSIS  150 mcg Intravenous Q Fri-HD  . ferric gluconate (FERRLECIT/NULECIT) IV  62.5 mg Intravenous Q M,W,F-HD  . FLUoxetine  5 mg Oral Daily  . insulin aspart  0-5 Units Subcutaneous QHS  . insulin aspart  0-9 Units Subcutaneous TID WC  . insulin glargine  5 Units Subcutaneous QHS  . levothyroxine  168 mcg Oral QAC breakfast  . metoprolol  75 mg Oral BID  . multivitamin  1 tablet Oral QHS  . sodium chloride  3 mL Intravenous Q12H  . vancomycin  500 mg Intravenous Q M,W,F-HD  . Warfarin - Pharmacist Dosing Inpatient   Does not apply q1800   Continuous Infusions: . heparin 1,700 Units/hr (04/19/13 0847)    Principal Problem:   Vascular graft infection Active Problems:   ESRD (end stage renal disease) on dialysis   Type I (juvenile type) diabetes mellitus without mention of complication, uncontrolled   Unspecified hypothyroidism   Unspecified essential hypertension   DVT of axillary vein, acute right   Protein-calorie malnutrition, severe   Diabetes mellitus    Time spent: >35 minutes     Esperanza Sheets  [facility] Pager [phone]. If 7PM-7AM, please contact night-coverage at [email or web address], password [facility] 04/19/2013, 1:03 PM  LOS: 4 days

## 2013-04-19 NOTE — Progress Notes (Signed)
Dr.Buriev notified of low CBGs and patient unwilling to take anything other than juice and tray. Continued to monitor patient till CBG wnl.

## 2013-04-20 DIAGNOSIS — E079 Disorder of thyroid, unspecified: Secondary | ICD-10-CM

## 2013-04-20 LAB — CBC
HCT: 30.1 % — ABNORMAL LOW (ref 39.0–52.0)
Hemoglobin: 9.5 g/dL — ABNORMAL LOW (ref 13.0–17.0)
MCH: 29.8 pg (ref 26.0–34.0)
MCHC: 31.6 g/dL (ref 30.0–36.0)
MCV: 94.4 fL (ref 78.0–100.0)
PLATELETS: 376 10*3/uL (ref 150–400)
RBC: 3.19 MIL/uL — ABNORMAL LOW (ref 4.22–5.81)
RDW: 16.7 % — AB (ref 11.5–15.5)
WBC: 8.5 10*3/uL (ref 4.0–10.5)

## 2013-04-20 LAB — PROTIME-INR
INR: 1.94 — ABNORMAL HIGH (ref 0.00–1.49)
PROTHROMBIN TIME: 21.6 s — AB (ref 11.6–15.2)

## 2013-04-20 LAB — GLUCOSE, CAPILLARY
GLUCOSE-CAPILLARY: 156 mg/dL — AB (ref 70–99)
GLUCOSE-CAPILLARY: 138 mg/dL — AB (ref 70–99)
Glucose-Capillary: 203 mg/dL — ABNORMAL HIGH (ref 70–99)
Glucose-Capillary: 256 mg/dL — ABNORMAL HIGH (ref 70–99)

## 2013-04-20 LAB — HEPARIN LEVEL (UNFRACTIONATED): Heparin Unfractionated: 0.41 IU/mL (ref 0.30–0.70)

## 2013-04-20 LAB — TROPONIN I

## 2013-04-20 MED ORDER — POLYETHYLENE GLYCOL 3350 17 G PO PACK
17.0000 g | PACK | Freq: Every day | ORAL | Status: DC
  Administered 2013-04-20: 17 g via ORAL
  Filled 2013-04-20 (×2): qty 1

## 2013-04-20 MED ORDER — CLONIDINE HCL 0.1 MG PO TABS
0.1000 mg | ORAL_TABLET | Freq: Three times a day (TID) | ORAL | Status: DC
  Administered 2013-04-20 – 2013-04-21 (×3): 0.1 mg via ORAL
  Filled 2013-04-20 (×3): qty 1

## 2013-04-20 MED ORDER — PANTOPRAZOLE SODIUM 20 MG PO TBEC
20.0000 mg | DELAYED_RELEASE_TABLET | Freq: Every day | ORAL | Status: DC
  Administered 2013-04-20 – 2013-04-21 (×2): 20 mg via ORAL
  Filled 2013-04-20 (×2): qty 1

## 2013-04-20 MED ORDER — WARFARIN SODIUM 7.5 MG PO TABS
7.5000 mg | ORAL_TABLET | Freq: Once | ORAL | Status: AC
  Administered 2013-04-20: 7.5 mg via ORAL
  Filled 2013-04-20: qty 1

## 2013-04-20 MED ORDER — GI COCKTAIL ~~LOC~~
30.0000 mL | Freq: Two times a day (BID) | ORAL | Status: DC | PRN
  Filled 2013-04-20: qty 30

## 2013-04-20 MED ORDER — SODIUM CHLORIDE 0.9 % IV SOLN
62.5000 mg | INTRAVENOUS | Status: DC
  Administered 2013-04-21: 62.5 mg via INTRAVENOUS
  Filled 2013-04-20 (×2): qty 5

## 2013-04-20 NOTE — Progress Notes (Signed)
[facility] Progress Note  Subjective:   Eating breakfast -small amount  Objective Filed Vitals:   04/19/13 1709 04/19/13 2143 04/20/13 0524 04/20/13 0802  BP: 157/74 137/64 156/69 171/85  Pulse: 74 80 69 73  Temp: 98.6 F (37 C) 98.8 F (37.1 C) 97.9 F (36.6 C) 98.5 F (36.9 C)  TempSrc: Oral Oral Oral Oral  Resp: 19 16 16 18   Height:      Weight:  62.052 kg (136 lb 12.8 oz)    SpO2: 99% 96% 99% 99%   Physical Exam General: NAD sitting in chair out of bed Heart: RRR  Lungs: crackles left base Abdomen: soft Extremities: no LE edema; left thigh still mod swelling; dressing in place;  Dialysis Access: right fem cath  Dialysis: [facility] MWF  4:15h 60kg 2K/2.25Ca Bath Heparin 2400 R fem permcath BFR 400  Hectorol none Epogen 18000 Venofer 50mg /wk  Other Labs op= 56 pth, hgb 10.4<9.8 TFS 38%   Assessment/Plan: 1. Removal L thigh infected AVG: cultures all neg, on empiric Vanc and maxepime, per VVS 2. ESRD: multiple access failures, using R fem cath. HD Monday first round with possible d/c thereafter 3. HTN/volume: volume excess better, UF 2.9 Friday with post weight of 62.2 - still 2 kg above EDW -if correct; now on home dose of norvasc, metoprolol and clonidine - challenge to 59 kg or 3 L Monday whichever is greater 4. Anemia - continue ESA/ iron weekly; Hgb on admission, false high, 9.5 now - reasonable considering surgery; on Aranesp 150 5. Metabolic bone disease - No vit . Hold renvela binder with phos 2.3 6. Recent RUE DVT 02/2013 ( axillary and jugular vein) - on heparin/coumadin 7. DM type 1 - per admit team 8. Hx CVA  Sheffield Slider, PA-C [facility] Beeper [phone] 04/20/2013,9:52 AM  LOS: 5 days   I have seen and examined patient, discussed with PA and agree with assessment and plan as outlined above. [name] MD pager [phone]    cell [phone] 04/20/2013, 1:35 PM    Additional Objective Labs: Lab Results  Component  Value Date   INR 1.94* 04/20/2013   INR 1.61* 04/19/2013   INR 1.59* 04/18/2013   Basic Metabolic Panel:  Recent Labs Lab 04/16/13 0141 04/17/13 0525 04/18/13 0750  NA 133* 129* 129*  K 3.7 3.8 4.2  CL 91* 88* 88*  CO2 26 21 25   GLUCOSE 202* 195* 169*  BUN 22 32* 38*  CREATININE 4.83* 6.75* 8.06*  CALCIUM 8.4 8.0* 7.8*  PHOS 2.3  --  4.3   Liver Function Tests:  Recent Labs Lab 04/16/13 0141 04/18/13 0750  AST 9  --   ALT <5  --   ALKPHOS 95  --   BILITOT 0.3  --   PROT 9.2*  --   ALBUMIN 2.8* 2.4*  CBC:  Recent Labs Lab 04/16/13 0141 04/17/13 0525 04/18/13 0500 04/18/13 1946 04/19/13 0407 04/20/13 0542  WBC 8.8 8.6 8.3 7.1 7.8 8.5  NEUTROABS 6.4  --   --   --   --   --   HGB 11.5* 10.1* 9.5* 9.6* 9.4* 9.5*  HCT 36.5* 31.4* 29.9* 30.4* 29.8* 30.1*  MCV 96.1 94.6 93.1 94.1 94.0 94.4  PLT 269 331 347 371 362 376  CBG:  Recent Labs Lab 04/19/13 1313 04/19/13 1359 04/19/13 1707 04/19/13 2148 04/20/13 0801  GLUCAP 54* 70 160* 188* 203*  Medications: . heparin 1,700 Units/hr (04/19/13 2329)   . amLODipine  10  mg Oral Daily  . aspirin EC  81 mg Oral Daily  . ceFEPime (MAXIPIME) IV  2 g Intravenous Q M,W,F-HD  . cloNIDine  0.1 mg Oral BID  . darbepoetin (ARANESP) injection - DIALYSIS  150 mcg Intravenous Q Fri-HD  . ferric gluconate (FERRLECIT/NULECIT) IV  62.5 mg Intravenous Q M,W,F-HD  . FLUoxetine  5 mg Oral Daily  . insulin aspart  0-5 Units Subcutaneous QHS  . insulin aspart  0-9 Units Subcutaneous TID WC  . levothyroxine  168 mcg Oral QAC breakfast  . metoprolol  75 mg Oral BID  . multivitamin  1 tablet Oral QHS  . senna-docusate  1 tablet Oral BID  . sodium chloride  3 mL Intravenous Q12H  . vancomycin  500 mg Intravenous Q M,W,F-HD  . Warfarin - Pharmacist Dosing Inpatient   Does not apply [phone]

## 2013-04-20 NOTE — Progress Notes (Signed)
Patient complaining of mid chest pain. Stated it was sharp. Pt asymptomatic. Pt was upset about his breakfast order being incorrect. MD notified. New orders placed. Will continue to monitor patient.

## 2013-04-20 NOTE — Progress Notes (Signed)
Patient ID: Marcus [name], male   DOB: [birth year], 58 y.o.   MRN: [id number] Vascular Surgery Progress Note  Subjective: 4 days post removal of infected left thigh AVG. Patient having hemodialysis on Monday Wednesday Friday. She does not communicate well.  Objective:  Filed Vitals:   04/20/13 0802  BP: 171/85  Pulse: 73  Temp: 98.5 F (36.9 C)  Resp: 18    Left leg incisions inspected. No hematoma noted. Mild drainage from inguinal wound which was treated with compression dressing last p.m. Appears to have stopped draining today. Does not appear to be fluid collection beneath the skin.   Labs:  Recent Labs Lab 04/16/13 0141 04/17/13 0525 04/18/13 0750  CREATININE 4.83* 6.75* 8.06*    Recent Labs Lab 04/16/13 0141 04/17/13 0525 04/18/13 0750  NA 133* 129* 129*  K 3.7 3.8 4.2  CL 91* 88* 88*  CO2 26 21 25   BUN 22 32* 38*  CREATININE 4.83* 6.75* 8.06*  GLUCOSE 202* 195* 169*  CALCIUM 8.4 8.0* 7.8*    Recent Labs Lab 04/18/13 1946 04/19/13 0407 04/20/13 0542  WBC 7.1 7.8 8.5  HGB 9.6* 9.4* 9.5*  HCT 30.4* 29.8* 30.1*  PLT 371 362 376    Recent Labs Lab 04/18/13 0500 04/19/13 0407 04/20/13 0542  INR 1.59* 1.61* 1.94*    I/O last 3 completed shifts: In: 1486.3 [P.O.:720; I.V.:646.3; Other:120] Out: -   Imaging: No results found.  Assessment/Plan:  POD #4   LOS: 5 days  s/p Procedure(s): REMOVAL OF ARTERIOVENOUS GORETEX GRAFT Left Thigh Patch Angioplasty of left femoral artery  Incisions left leg appear to be healing satisfactorily following removal left thigh AVG Cultures pending Patient on vancomycin  Dr. Myra GianottiBrabham will see patient in a.m. and possible DC home following hemodialysis tomorrow    Hart RochesterLawson, MD 04/20/2013 9:26 AM

## 2013-04-20 NOTE — Progress Notes (Signed)
Patients left groin incision dressing found to be saturated moderately with blood. Site was assessed and was not actively bleeding. New bandage and ace wrap applied. MD Buriev notified. MD stated to stop heparin gtt. Heparin gtt was stopped. Site will continue to be monitored.

## 2013-04-20 NOTE — Evaluation (Signed)
Physical Therapy Evaluation Patient Details Name: Marcus JacksonCharles Wagler MRN: [id number] DOB: 05/15/1955 Today's Date: 04/20/2013 Time: 1000-1020 PT Time Calculation (min): 20 min  PT Assessment / Plan / Recommendation History of Present Illness    58 y.o. male with Past medical history of ESRD on hemodialysis, CVA, GI bleed, pulmonary edema, dyslipidemia, hypothyroidism, hypertension, GERD, recent admissions for graft failure admitted with graft infection    Clinical Impression  Pt adm due to the above. Presents with limitations in independence with functional mobility. Pt to benefit from skilled PT to address deficits listed below and increase independence with mobility in order to return home with family. Pt will need to be MOD I for mobility and transfers prior to D/C.     PT Assessment  Patient needs continued PT services    Follow Up Recommendations  Home health PT;Supervision/Assistance - 24 hour;Other (comment) (pt refusing SNF )    Does the patient have the potential to tolerate intense rehabilitation      Barriers to Discharge Decreased caregiver support pt does not have 24/7 (A)     Equipment Recommendations  None recommended by PT    Recommendations for Other Services OT consult   Frequency Min 3X/week    Precautions / Restrictions Precautions Precautions: Fall Restrictions Weight Bearing Restrictions: No   Pertinent Vitals/Pain Stated "well yeah i hurt" but would not rate his pain in his Lt thigh. patient repositioned for comfort       Mobility  Bed Mobility Bed Mobility: Supine to Sit;Sitting - Scoot to Edge of Bed Supine to Sit: 4: Min assist;HOB elevated;With rails Sitting - Scoot to Edge of Bed: 5: Supervision;With rail Details for Bed Mobility Assistance: (A) to bring trunk to upright sitting position; requires incr time to perform; cues for hand placement and sequencing  Transfers Transfers: Sit to Stand;Stand to Sit;Stand Pivot Transfers Sit to Stand: From  bed;4: Min assist Stand to Sit: 4: Min assist;To chair/3-in-1;With upper extremity assist Stand Pivot Transfers: 4: Min assist;From elevated surface;With armrests Details for Transfer Assistance: pt with ataxic like movements; cues for hand placement and safety with RW; pt reported he was fatigued with transfer and deferred ambulating today; cues to control descent to chair with UEs; pt has tendency to flop into chair  Ambulation/Gait Ambulation/Gait Assistance: Not tested (comment) Stairs: No Wheelchair Mobility Wheelchair Mobility: No         PT Diagnosis: Abnormality of gait;Generalized weakness  PT Problem List: Decreased strength;Decreased activity tolerance;Decreased balance;Decreased mobility;Decreased knowledge of use of DME;Decreased safety awareness PT Treatment Interventions: DME instruction;Gait training;Stair training;Functional mobility training;Therapeutic activities;Therapeutic exercise;Balance training;Neuromuscular re-education;Patient/family education     PT Goals(Current goals can be found in the care plan section) Acute Rehab PT Goals Patient Stated Goal: to watch a movie  PT Goal Formulation: With patient Time For Goal Achievement: 05/04/13 Potential to Achieve Goals: Fair  Visit Information  Last PT Received On: 04/20/13 Assistance Needed: +2 (for ambulating )       Prior Functioning  Home Living Family/patient expects to be discharged to:: Private residence Living Arrangements: Other relatives;Other (Comment) (Sister) Available Help at Discharge: Family;Friend(s);Available PRN/intermittently Type of Home: Apartment Home Access: Stairs to enter Entrance Stairs-Number of Steps: 4 Entrance Stairs-Rails: Right Home Layout: One level Home Equipment: Walker - 4 wheels;Shower seat Prior Function Level of Independence: Independent with assistive device(s) Comments: pt becomes agitated when being asked questions regarding PLOF  Communication Communication:  No difficulties Dominant Hand: Right    Cognition  Cognition Arousal/Alertness: Awake/alert Behavior  During Therapy: Anxious;Agitated Overall Cognitive Status: Impaired/Different from baseline Area of Impairment: Orientation;Safety/judgement;Following commands;Problem solving Orientation Level: Disoriented to;Situation;Time Memory: Decreased short-term memory Following Commands: Follows one step commands consistently;Follows one step commands with increased time Safety/Judgement: Decreased awareness of deficits;Decreased awareness of safety Problem Solving: Difficulty sequencing;Slow processing;Requires verbal cues;Requires tactile cues    Extremity/Trunk Assessment Upper Extremity Assessment Upper Extremity Assessment: Defer to OT evaluation Lower Extremity Assessment Lower Extremity Assessment: Generalized weakness Cervical / Trunk Assessment Cervical / Trunk Assessment: Kyphotic   Balance Balance Balance Assessed: Yes Static Sitting Balance Static Sitting - Balance Support: Feet supported;No upper extremity supported Static Sitting - Level of Assistance: 5: Stand by assistance  End of Session PT - End of Session Equipment Utilized During Treatment: Gait belt Activity Tolerance: Patient limited by fatigue Patient left: in chair;with call bell/phone within reach Nurse Communication: Mobility status;Precautions  GP     Donell Sievert, [place] [phone] 04/20/2013, 12:08 PM

## 2013-04-20 NOTE — Progress Notes (Addendum)
[facility] PROGRESS NOTE  Marcus [name] ZOX:[id number] DOB: 1956/02/20 DOA: 04/15/2013 PCP: Galvin Proffer, MD  Assessment/Plan: 58 y.o. male with Past medical history of ESRD on hemodialysis, CVA, GI bleed, pulmonary edema, dyslipidemia, hypothyroidism, hypertension, GERD, recent admissions for graft failure admitted with graft infection   Principal Problem:  1. 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 On IV antibiotics empiric. Neg cultures; defer atx to VVS possible d/c in AM post HD per VVS Active Problems:  2. ESRD (end stage renal disease) on dialysis: HD per nephrology; . Appreciate nephrology help   3. Type I (juvenile type) diabetes mellitus without mention of complication, uncontrolled: hold Lantus due to hypoglycemia; cont ISS: reassess daily   4. hypothyroidism: TSH markedly elevated and see no record of patient on Synthroid. Discharge summary approximately 6 weeks ago, patient is supposed to be on 168 mcg and given markedly elevated TSH, I suspect that he has not been taking his medications.   5. Unspecified essential hypertension: resume amlodipine, increased BB, resume clonidine    6. DVT of axillary vein, acute right: On Coumadin. INR supratherapeutic initially, reversed for surgery. Pharmacy now titrating   7. Protein-calorie malnutrition, severe: Patient meets criteria in the context of chronic illness  Obtain PT/OT eval;   D/c plans per vascular, nephrology likely on Monday per VVS  Code Status: full Family Communication: d/w patient, updated [name], [name] Other [phone]  (indicate person spoken with, relationship, and if by phone, the number) Disposition Plan: home per vascular, nephrology eval    Consultants:  Vascular  Nephrology   Procedures:  HD  Graft removal   Antibiotics:  vanc 12/31<<<<  Cefepime 12/31<<<<   (indicate start date, and stop date if  known)  HPI/Subjective: alert  Objective: Filed Vitals:   04/20/13 0802  BP: 171/85  Pulse: 73  Temp: 98.5 F (36.9 C)  Resp: 18    Intake/Output Summary (Last 24 hours) at 04/20/13 1128 Last data filed at 04/20/13 0950  Gross per 24 hour  Intake    696 ml  Output      0 ml  Net    696 ml   Filed Weights   04/18/13 1151 04/18/13 2103 04/19/13 2143  Weight: 62.2 kg (137 lb 2 oz) 61.372 kg (135 lb 4.8 oz) 62.052 kg (136 lb 12.8 oz)    Exam:   General:  alert  Cardiovascular: s1.s2 rrr  Respiratory: CTA BL  Abdomen: soft, nt, nd   Musculoskeletal: no LE edema   Data Reviewed: Basic Metabolic Panel:  Recent Labs Lab 04/16/13 0141 04/17/13 0525 04/18/13 0750  NA 133* 129* 129*  K 3.7 3.8 4.2  CL 91* 88* 88*  CO2 26 21 25   GLUCOSE 202* 195* 169*  BUN 22 32* 38*  CREATININE 4.83* 6.75* 8.06*  CALCIUM 8.4 8.0* 7.8*  PHOS 2.3  --  4.3   Liver Function Tests:  Recent Labs Lab 04/16/13 0141 04/18/13 0750  AST 9  --   ALT <5  --   ALKPHOS 95  --   BILITOT 0.3  --   PROT 9.2*  --   ALBUMIN 2.8* 2.4*   No results found for this basename: LIPASE, AMYLASE,  in the last 168 hours No results found for this basename: AMMONIA,  in the last 168 hours CBC:  Recent Labs Lab 04/16/13 0141 04/17/13 0525 04/18/13 0500 04/18/13 1946 04/19/13 0407 04/20/13 0542  WBC 8.8 8.6 8.3 7.1 7.8 8.5  NEUTROABS 6.4  --   --   --   --   --  HGB 11.5* 10.1* 9.5* 9.6* 9.4* 9.5*  HCT 36.5* 31.4* 29.9* 30.4* 29.8* 30.1*  MCV 96.1 94.6 93.1 94.1 94.0 94.4  PLT 269 331 347 371 362 376   Cardiac Enzymes: No results found for this basename: CKTOTAL, CKMB, CKMBINDEX, TROPONINI,  in the last 168 hours BNP (last 3 results) No results found for this basename: PROBNP,  in the last 8760 hours CBG:  Recent Labs Lab 04/19/13 1313 04/19/13 1359 04/19/13 1707 04/19/13 2148 04/20/13 0801  GLUCAP 54* 70 160* 188* 203*    Recent Results (from the past 240 hour(s))   SURGICAL PCR SCREEN     Status: None   Collection Time    04/16/13 12:11 AM      Result Value Range Status   MRSA, PCR NEGATIVE  NEGATIVE Final   Staphylococcus aureus NEGATIVE  NEGATIVE Final   Comment:            The Xpert SA Assay (FDA     approved for NASAL specimens     in patients over 58 years of age),     is one component of     a comprehensive surveillance     program.  Test performance has     been validated by [facility] for patients greater     than or equal to 58 year old.     It is not intended     to diagnose infection nor to     guide or monitor treatment.  CULTURE, BLOOD (ROUTINE X 2)     Status: None   Collection Time    04/16/13  1:41 AM      Result Value Range Status   Specimen Description BLOOD LEFT ARM   Final   Special Requests BOTTLES DRAWN AEROBIC ONLY 3CC   Final   Culture  Setup Time     Final   Value: 04/16/2013 09:02     Performed at [facility]   Culture     Final   Value:        BLOOD CULTURE RECEIVED NO GROWTH TO DATE CULTURE WILL BE HELD FOR 5 DAYS BEFORE ISSUING A FINAL NEGATIVE REPORT     Performed at [facility]   Report Status PENDING   Incomplete  CULTURE, BLOOD (ROUTINE X 2)     Status: None   Collection Time    04/16/13  1:51 AM      Result Value Range Status   Specimen Description BLOOD LEFT HAND   Final   Special Requests BOTTLES DRAWN AEROBIC ONLY 2CC   Final   Culture  Setup Time     Final   Value: 04/16/2013 09:01     Performed at [facility]   Culture     Final   Value:        BLOOD CULTURE RECEIVED NO GROWTH TO DATE CULTURE WILL BE HELD FOR 5 DAYS BEFORE ISSUING A FINAL NEGATIVE REPORT     Performed at [facility]   Report Status PENDING   Incomplete  WOUND CULTURE     Status: None   Collection Time    04/16/13 11:45 AM      Result Value Range Status   Specimen Description WOUND LEFT GROIN   Final   Special Requests SWAB OF LEFT GROIN WOUND PT ON VANCPMYCIN   Final   Gram Stain      Final   Value: RARE WBC PRESENT, PREDOMINANTLY PMN  RARE SQUAMOUS EPITHELIAL CELLS PRESENT     NO ORGANISMS SEEN     Performed at [facility]   Culture     Final   Value: NO GROWTH 2 DAYS     Performed at [facility]   Report Status 04/19/2013 FINAL   Final  ANAEROBIC CULTURE     Status: None   Collection Time    04/16/13 11:45 AM      Result Value Range Status   Specimen Description WOUND LEFT GROIN   Final   Special Requests SWAB ON LEFT GROIN WOUND PT ON VANCOMYCIN   Final   Gram Stain     Final   Value: RARE WBC PRESENT, PREDOMINANTLY PMN     RARE SQUAMOUS EPITHELIAL CELLS PRESENT     NO ORGANISMS SEEN     Performed at [facility]   Culture     Final   Value: NO ANAEROBES ISOLATED; CULTURE IN PROGRESS FOR 5 DAYS     Performed at [facility]   Report Status PENDING   Incomplete     Studies: No results found.  Scheduled Meds: . amLODipine  10 mg Oral Daily  . aspirin EC  81 mg Oral Daily  . ceFEPime (MAXIPIME) IV  2 g Intravenous Q M,W,F-HD  . cloNIDine  0.1 mg Oral BID  . darbepoetin (ARANESP) injection - DIALYSIS  150 mcg Intravenous Q Fri-HD  . [START ON 04/21/2013] ferric gluconate (FERRLECIT/NULECIT) IV  62.5 mg Intravenous Q Mon-HD  . FLUoxetine  5 mg Oral Daily  . insulin aspart  0-5 Units Subcutaneous QHS  . insulin aspart  0-9 Units Subcutaneous TID WC  . levothyroxine  168 mcg Oral QAC breakfast  . metoprolol  75 mg Oral BID  . multivitamin  1 tablet Oral QHS  . senna-docusate  1 tablet Oral BID  . sodium chloride  3 mL Intravenous Q12H  . vancomycin  500 mg Intravenous Q M,W,F-HD  . Warfarin - Pharmacist Dosing Inpatient   Does not apply q1800   Continuous Infusions: . heparin 1,700 Units/hr (04/19/13 2329)    Principal Problem:   Vascular graft infection Active Problems:   ESRD (end stage renal disease) on dialysis   Type I (juvenile type) diabetes mellitus without mention of complication, uncontrolled    Unspecified hypothyroidism   Unspecified essential hypertension   DVT of axillary vein, acute right   Protein-calorie malnutrition, severe   Diabetes mellitus    Time spent: >35 minutes     Esperanza Sheets  [facility] Pager [phone]. If 7PM-7AM, please contact night-coverage at [email or web address], password [facility] 04/20/2013, 11:28 AM  LOS: 5 days

## 2013-04-20 NOTE — Progress Notes (Signed)
ANTICOAGULATION CONSULT NOTE - Follow Up Consult  Pharmacy Consult for Heparin and Coumadin Indication: hx DVT right UE (02/2013)  Allergies  Allergen Reactions  . Penicillins Other (See Comments)    Unknown reaction    Patient Measurements: Height: 6\' 2"  (188 cm) Weight: 136 lb 12.8 oz (62.052 kg) IBW/kg (Calculated) : 82.2 Heparin Dosing Weight: 61.4 kg  Vital Signs: Temp: 98.5 F (36.9 C) (01/04 0802) Temp src: Oral (01/04 0802) BP: 171/85 mmHg (01/04 0802) Pulse Rate: 73 (01/04 0802)  Labs:  Recent Labs  04/18/13 0500 04/18/13 0750  04/18/13 1946 04/19/13 0407 04/19/13 1420 04/20/13 0542  HGB 9.5*  --   --  9.6* 9.4*  --  9.5*  HCT 29.9*  --   --  30.4* 29.8*  --  30.1*  PLT 347  --   --  371 362  --  376  LABPROT 18.5*  --   --   --  18.7*  --  21.6*  INR 1.59*  --   --   --  1.61*  --  1.94*  HEPARINUNFRC 0.12*  --   < >  --  0.17* 0.33 0.41  CREATININE  --  8.06*  --   --   --   --   --   < > = values in this interval not displayed.  Estimated Creatinine Clearance: 8.9 ml/min (by C-G formula based on Cr of 8.06).  Assessment: 58 yo male on chronic Coumadin for hx DVT s/p OR for removal of infected dialysis graft. Heparin and Coumadin resumed 12/31.  Heparin level is therapeutic at 0.41. INR this am remains slightly SUBtherapeutic at 1.94 (of note, pt INR on admit was SUPRAtherapeutic and was reversed with Vit K for surgery). H/H slightly trending down 11.5>10.1>9.5, plt wnl. No bleeding or complications noted per chart notes.   Home dose: Coumadin 7.5 mg PO daily  Goal of Therapy:  INR 2-3 Heparin level 0.3-0.7 units/ml Monitor platelets by anticoagulation protocol: Yes   Plan:  - Cont heparin gtt at 1700 units/hr - Coumadin 7.5 mg PO x 1 tonight - Monitor daily HL, PT/INR, CBC, s/s bleeding  Margie BilletErika K. von Vajna, PharmD Clinical Pharmacist - Resident Pager: [phone] Pharmacy: [phone] 04/20/2013 1:01 PM

## 2013-04-21 ENCOUNTER — Encounter (HOSPITAL_COMMUNITY): Payer: Self-pay | Admitting: Surgery

## 2013-04-21 LAB — GLUCOSE, CAPILLARY
Glucose-Capillary: 216 mg/dL — ABNORMAL HIGH (ref 70–99)
Glucose-Capillary: 307 mg/dL — ABNORMAL HIGH (ref 70–99)
Glucose-Capillary: 143 mg/dL — ABNORMAL HIGH (ref 70–99)

## 2013-04-21 LAB — CBC
HCT: 30.4 % — ABNORMAL LOW (ref 39.0–52.0)
HEMOGLOBIN: 9.8 g/dL — AB (ref 13.0–17.0)
MCH: 30 pg (ref 26.0–34.0)
MCHC: 32.2 g/dL (ref 30.0–36.0)
MCV: 93 fL (ref 78.0–100.0)
PLATELETS: 401 10*3/uL — AB (ref 150–400)
RBC: 3.27 MIL/uL — ABNORMAL LOW (ref 4.22–5.81)
RDW: 17 % — ABNORMAL HIGH (ref 11.5–15.5)
WBC: 6.9 10*3/uL (ref 4.0–10.5)

## 2013-04-21 LAB — RENAL FUNCTION PANEL
Albumin: 2.5 g/dL — ABNORMAL LOW (ref 3.5–5.2)
BUN: 32 mg/dL — ABNORMAL HIGH (ref 6–23)
CO2: 23 mEq/L (ref 19–32)
Calcium: 8.5 mg/dL (ref 8.4–10.5)
Chloride: 89 mEq/L — ABNORMAL LOW (ref 96–112)
Creatinine, Ser: 8.48 mg/dL — ABNORMAL HIGH (ref 0.50–1.35)
GFR calc Af Amer: 7 mL/min — ABNORMAL LOW (ref >90)
GFR calc non Af Amer: 6 mL/min — ABNORMAL LOW (ref >90)
Glucose, Bld: 249 mg/dL — ABNORMAL HIGH (ref 70–99)
Phosphorus: 5.4 mg/dL — ABNORMAL HIGH (ref 2.3–4.6)
Potassium: 4.9 mEq/L (ref 3.7–5.3)
Sodium: 129 mEq/L — ABNORMAL LOW (ref 137–147)

## 2013-04-21 LAB — ANAEROBIC CULTURE

## 2013-04-21 LAB — HEPARIN LEVEL (UNFRACTIONATED): Heparin Unfractionated: <0.1 IU/mL — ABNORMAL LOW (ref 0.30–0.70)

## 2013-04-21 LAB — PROTIME-INR
INR: 2.34 — ABNORMAL HIGH (ref 0.00–1.49)
PROTHROMBIN TIME: 24.9 s — AB (ref 11.6–15.2)

## 2013-04-21 MED ORDER — HYDROCODONE-ACETAMINOPHEN 5-325 MG PO TABS: 1.0000 | ORAL_TABLET | ORAL | Status: DC | PRN

## 2013-04-21 MED ORDER — NEPRO/CARBSTEADY PO LIQD: 237.0000 mL | ORAL | Status: DC | PRN

## 2013-04-21 MED ORDER — LIDOCAINE HCL (PF) 1 % IJ SOLN: 5.0000 mL | INTRAMUSCULAR | Status: DC | PRN

## 2013-04-21 MED ORDER — SODIUM CHLORIDE 0.9 % IV SOLN: 100.0000 mL | INTRAVENOUS | Status: DC | PRN

## 2013-04-21 MED ORDER — METOPROLOL TARTRATE 50 MG PO TABS: 75.0000 mg | ORAL_TABLET | Freq: Two times a day (BID) | ORAL | Status: DC

## 2013-04-21 MED ORDER — INSULIN ASPART 100 UNIT/ML ~~LOC~~ SOLN: 2.0000 [IU] | Freq: Three times a day (TID) | SUBCUTANEOUS | Status: DC

## 2013-04-21 MED ORDER — HEPARIN SODIUM (PORCINE) 1000 UNIT/ML DIALYSIS: 20.0000 [IU]/kg | INTRAMUSCULAR | Status: DC | PRN

## 2013-04-21 MED ORDER — AMLODIPINE BESYLATE 10 MG PO TABS: 10.0000 mg | ORAL_TABLET | Freq: Every day | ORAL | Status: DC

## 2013-04-21 MED ORDER — ALTEPLASE 100 MG IV SOLR
6.0000 mg | Freq: Once | INTRAVENOUS | Status: AC
  Administered 2013-04-21: 6 mg
  Filled 2013-04-21 (×2): qty 6

## 2013-04-21 MED ORDER — NEPRO/CARBSTEADY PO LIQD
237.0000 mL | Freq: Two times a day (BID) | ORAL | Status: DC
  Administered 2013-04-21: 237 mL via ORAL

## 2013-04-21 MED ORDER — HEPARIN SODIUM (PORCINE) 1000 UNIT/ML DIALYSIS: 1000.0000 [IU] | INTRAMUSCULAR | Status: DC | PRN

## 2013-04-21 MED ORDER — PENTAFLUOROPROP-TETRAFLUOROETH EX AERO: 1.0000 "application " | INHALATION_SPRAY | CUTANEOUS | Status: DC | PRN

## 2013-04-21 MED ORDER — HEPARIN SODIUM (PORCINE) 1000 UNIT/ML DIALYSIS: 2400.0000 [IU] | Freq: Once | INTRAMUSCULAR | Status: DC

## 2013-04-21 MED ORDER — WARFARIN SODIUM 7.5 MG PO TABS
7.5000 mg | ORAL_TABLET | Freq: Once | ORAL | Status: AC
  Administered 2013-04-21: 7.5 mg via ORAL
  Filled 2013-04-21: qty 1

## 2013-04-21 MED ORDER — SODIUM CHLORIDE 0.9 % IV SOLN
100.0000 mL | INTRAVENOUS | Status: DC | PRN
Start: 2013-04-21 — End: 2013-04-21

## 2013-04-21 MED ORDER — LEVOTHYROXINE SODIUM 112 MCG PO TABS: 168.0000 ug | ORAL_TABLET | Freq: Every day | ORAL | Status: DC

## 2013-04-21 MED ORDER — ALTEPLASE 2 MG IJ SOLR: 2.0000 mg | Freq: Once | INTRAMUSCULAR | Status: DC | PRN

## 2013-04-21 MED ORDER — PENTAFLUOROPROP-TETRAFLUOROETH EX AERO
1.0000 | INHALATION_SPRAY | CUTANEOUS | Status: DC | PRN
Start: 2013-04-21 — End: 2013-04-21

## 2013-04-21 MED ORDER — LIDOCAINE-PRILOCAINE 2.5-2.5 % EX CREA: 1.0000 "application " | TOPICAL_CREAM | CUTANEOUS | Status: DC | PRN

## 2013-04-21 MED ORDER — CLONIDINE HCL 0.1 MG PO TABS: 0.1000 mg | ORAL_TABLET | Freq: Three times a day (TID) | ORAL | Status: DC

## 2013-04-21 MED ORDER — FLUOXETINE HCL 10 MG PO TABS: 5.0000 mg | ORAL_TABLET | Freq: Every morning | ORAL | Status: DC

## 2013-04-21 NOTE — Care Management Note (Deleted)
   CARE MANAGEMENT NOTE 04/21/2013  Patient:  BROWN,FAYE S   Account Number:  [id number]  Date Initiated:  04/21/2013  Documentation initiated by:  Anner Baity  Subjective/Objective Assessment:   Noted CM consult for coumadin clinic followup.     Action/Plan:   Met with pt who has PCP, Dr Osei-Bonsu who has monitored Coumadin previously and she wishes to use Dr Osei-Bonsu for this again. Will call office and arrange followup when needed.   Anticipated DC Date:  04/21/2013   Anticipated DC Plan:  HOME/SELF CARE         Choice offered to / List presented to:             Status of service:  Completed, signed off Medicare Important Message given?   (If response is "NO", the following Medicare IM given date fields will be blank) Date Medicare IM given:   Date Additional Medicare IM given:    Discharge Disposition:  HOME/SELF CARE  Per UR Regulation:    If discussed at Long Length of Stay Meetings, dates discussed:    Comments:    

## 2013-04-21 NOTE — Progress Notes (Signed)
[facility] Progress Note  Subjective:   Seen in dialysis.   Says leg pain persists - "just had a pain pill".  Had some bleeding from his left surgical wound during the night and heparin drip was stopped TDC not working well today - will TPA and try again in 2 hours  Objective Filed Vitals:   04/20/13 0802 04/20/13 1810 04/20/13 2118 04/21/13 0516  BP: 171/85 127/74 143/67 179/84  Pulse: 73 67 66 67  Temp: 98.5 F (36.9 C) 97.3 F (36.3 C) 98.2 F (36.8 C) 97.9 F (36.6 C)  TempSrc: Oral Oral Oral Oral  Resp: 18 18 16 20   Height:      Weight:   64.774 kg (142 lb 12.8 oz)   SpO2: 99% 98% 99% 100%   Physical Exam General: NAD lying in bed in the HD unit Neck: large goiter Heart: RRR S1S2 No S3 Lungs: Anteriorly clear Abdomen: soft, not tender Extremities: no LE edema; skin dry and scaly; left thigh still mod swelling; dressing in place; not bloody at this time;; RUE edema (improved since last admit) Dialysis Access: right fem cath - getting TPA for catheter malfunction  Dialysis: [facility] MWF  4:15h 60kg 2K/2.25Ca Bath Heparin 2400 R fem permcath BFR 400  Hectorol none Epogen 18000 Venofer 50mg /wk  Other Labs op= 56 pth, hgb 10.4<9.8 TFS 38%   Assessment/Plan: 1. Removal L thigh infected AVG: cultures all neg, on empiric Vanc and maxepime, per VVS; some bleeding last night; Hb pending 2. ESRD: multiple access failures, using R fem cath. Activase catheter now d/t poor function 3. HTN/volume: - challenge to 59 kg or 3 L today whichever is greater 4. Anemia - continue ESA/ iron weekly; Hgb on admission, false high,  on Aranesp 150; CBC pending 5. Metabolic bone disease - No vit . Hold renvela binder with phos 2.3 6. Recent RUE DVT 02/2013 ( axillary and jugular vein) - on heparin/coumadin - heparin drip stopped during the night d/t bleeding from surgical site of graft removal 7. DM type 1 - per admit team    Additional Objective Labs: Lab Results  Component  Value Date   INR 2.34* 04/21/2013   INR 1.94* 04/20/2013   INR 1.61* 04/19/2013   Today's labs pending  Recent Labs Lab 04/16/13 0141 04/17/13 0525 04/18/13 0750  NA 133* 129* 129*  K 3.7 3.8 4.2  CL 91* 88* 88*  CO2 26 21 25   GLUCOSE 202* 195* 169*  BUN 22 32* 38*  CREATININE 4.83* 6.75* 8.06*  CALCIUM 8.4 8.0* 7.8*  PHOS 2.3  --  4.3    Recent Labs Lab 04/16/13 0141 04/18/13 0750  AST 9  --   ALT <5  --   ALKPHOS 95  --   BILITOT 0.3  --   PROT 9.2*  --   ALBUMIN 2.8* 2.4*  CBC:  Recent Labs Lab 04/16/13 0141  04/18/13 0500 04/18/13 1946 04/19/13 0407 04/20/13 0542 04/21/13 0557  WBC 8.8  < > 8.3 7.1 7.8 8.5 6.9  NEUTROABS 6.4  --   --   --   --   --   --   HGB 11.5*  < > 9.5* 9.6* 9.4* 9.5* 9.8*  HCT 36.5*  < > 29.9* 30.4* 29.8* 30.1* 30.4*  MCV 96.1  < > 93.1 94.1 94.0 94.4 93.0  PLT 269  < > 347 371 362 376 401*  < > = values in this interval not displayed.CBG:  Recent Labs Lab 04/20/13 0801  04/20/13 1112 04/20/13 1625 04/20/13 2202 04/21/13 0744  GLUCAP 203* 256* 156* 138* 216*  Medications: . heparin Stopped (04/20/13 1426)   . amLODipine  10 mg Oral Daily  . aspirin EC  81 mg Oral Daily  . ceFEPime (MAXIPIME) IV  2 g Intravenous Q M,W,F-HD  . cloNIDine  0.1 mg Oral TID  . darbepoetin (ARANESP) injection - DIALYSIS  150 mcg Intravenous Q Fri-HD  . ferric gluconate (FERRLECIT/NULECIT) IV  62.5 mg Intravenous Q Mon-HD  . FLUoxetine  5 mg Oral Daily  . insulin aspart  0-5 Units Subcutaneous QHS  . insulin aspart  0-9 Units Subcutaneous TID WC  . levothyroxine  168 mcg Oral QAC breakfast  . metoprolol  75 mg Oral BID  . multivitamin  1 tablet Oral QHS  . pantoprazole  20 mg Oral Daily  . polyethylene glycol  17 g Oral Daily  . senna-docusate  1 tablet Oral BID  . sodium chloride  3 mL Intravenous Q12H  . vancomycin  500 mg Intravenous Q M,W,F-HD  . Warfarin - Pharmacist Dosing Inpatient   Does not apply [phone]

## 2013-04-21 NOTE — Procedures (Signed)
Attempted to initiate HD via right fem cath - high venous pressures - will require activase for 2 hours - then try again to use the catheter.  Labs are all still pending/cbd

## 2013-04-21 NOTE — Progress Notes (Signed)
   CARE MANAGEMENT NOTE 04/21/2013  Patient:  Marcus [name],Marcus [name]   Account Number:  [id number]  Date Initiated:  04/21/2013  Documentation initiated by:  Darlyne RussianOLAND,[place]pet[place] Elling  Subjective/Objective Assessment:   admitted with infection of left thigh graft  graft removed, new cath R thigh     Action/Plan:   progression of care and discharge planning  active with [facility] home [facility], resume home health   Anticipated DC Date:  04/21/2013   Anticipated DC Plan:  HOME W HOME HEALTH SERVICES      DC Planning Services  CM consult      [facility]  [facility] Of [facility]/PTA Provider   Choice offered to / List presented to:             Status of service:  Completed, signed off Medicare Important Message given?   (If response is "NO", the following Medicare IM given date fields will be blank) Date Medicare IM given:   Date Additional Medicare IM given:    Discharge Disposition:  HOME W HOME HEALTH SERVICES  Per UR Regulation:    If discussed at Long Length of Stay Meetings, dates discussed:    Comments:  04/21/2013  Darlyne RussianAnnette Bayyinah Dukeman RN, CCM  [phone] CM referral: home health PT  [facility].  Spoke with patient regarding continued home health services and to resume with [facility]  [facility] Baxter HireKristen called to advise of planned discharge for today and resume home health.

## 2013-04-21 NOTE — Progress Notes (Signed)
ANTIBIOTIC/ANTICOAGULATION CONSULT NOTE - FOLLOW UP  Pharmacy Consult for Vancomycin, Cefepime, Warfarin Indication: infected AVG, s/p removal, Warfarin for DVT 11/14  Allergies  Allergen Reactions  . Penicillins Other (See Comments)    Unknown reaction    Patient Measurements: Height: 6\' 2"  (188 cm) Weight: 142 lb 12.8 oz (64.774 kg) IBW/kg (Calculated) : 82.2 Adjusted Body Weight: n/a  Vital Signs: Temp: 97.9 F (36.6 C) (01/05 0516) Temp src: Oral (01/05 0516) BP: 179/84 mmHg (01/05 0516) Pulse Rate: 67 (01/05 0516) Intake/Output from previous day: 01/04 0701 - 01/05 0700 In: 306.4 [P.O.:180; I.V.:126.4] Out: -  Intake/Output from this shift: Total I/O In: 60 [P.O.:60] Out: -   Labs:  Recent Labs  04/19/13 0407 04/20/13 0542 04/21/13 0557  WBC 7.8 8.5 6.9  HGB 9.4* 9.5* 9.8*  PLT 362 376 401*   Estimated Creatinine Clearance: 9.3 ml/min (by C-G formula based on Cr of 8.06).   Anti-infectives   Start     Dose/Rate Route Frequency Ordered Stop   04/17/13 0000  vancomycin (VANCOCIN) IVPB 1000 mg/200 mL premix  Status:  Discontinued     1,000 mg 200 mL/hr over 60 Minutes Intravenous To Surgery 04/16/13 0832 04/16/13 0838   04/16/13 1200  vancomycin (VANCOCIN) 500 mg in sodium chloride 0.9 % 100 mL IVPB     500 mg 100 mL/hr over 60 Minutes Intravenous Every M-W-F (Hemodialysis) 04/16/13 0049     04/16/13 1130  vancomycin (VANCOCIN) IVPB 1000 mg/200 mL premix  Status:  Discontinued     1,000 mg 200 mL/hr over 60 Minutes Intravenous  Once 04/16/13 1117 04/16/13 1505   04/16/13 0130  ceFEPIme (MAXIPIME) 2 g in dextrose 5 % 50 mL IVPB     2 g 100 mL/hr over 30 Minutes Intravenous Every M-W-F (Hemodialysis) 04/16/13 0049        Assessment: 58 yo male on empiric vancomycin and cefepime for infected left femoral graft, now s/p OR for removal.  Vancomycin pre-HD level at goal on 04/18/13.  On vancomycin 500 mg q HD and cefepime 2g q HD.   12/31 Wound culture>  ngtd 12/31 BCx x 2 > ngtd  Pt is on warfarin for DVT in Nov 2014. Was on warfarin prior to admission at a dose of 7.5 mg po daily.  Pt received therapeutic heparin drip from 1/1-1/4, when it was stopped due to bleeding from L groin site.  Will continue with warfarin today. INR is therapeutic at 2.34. CBC stable this am.  Goal of Therapy:  Pre-HD vancomycin level 15-25 INR: 2-3  Plan:  1. Continue vancomycin at 500 mg qHD (MWF) 2. Continue cefepime 2 g qHD (MWF) 3. Consider vanc random level 1/7 4. Warfarin 7.5 mg po x1 5. INR daily 6. Will monitor for s/sx of bleeding and duration of antibiotics   Agapito GamesAlison Fishel Wamble, PharmD, BCPS Clinical Pharmacist 04/21/2013 9:21 AM

## 2013-04-21 NOTE — Procedures (Signed)
I have personally attended this patient's dialysis session.   Post TPA to both ports of femoral TDC, now achieving BFR of 400 with VP of 210. 2K bath (K 4.9)    Cassie Shedlock B

## 2013-04-21 NOTE — Progress Notes (Signed)
Patient to be discharged home post HD.Patient still have to get nuclecit,vancomycin,maxipine as it was not given in HD.Patient request for me to call his sister Mindi JunkerMarsha or Aggie Cosierheresa to come pick him up.Spoke with Aggie Cosierheresa as Mindi JunkerMarsha is at work.She will come to pick him up. Inice Sanluis Joselita,RN

## 2013-04-21 NOTE — Discharge Summary (Signed)
Physician Discharge Summary  Marcus [name] GNF:[id number] DOB: 14-Aug-1955 DOA: 04/15/2013  PCP: Galvin Proffer, MD  Admit date: 04/15/2013 Discharge date: 04/21/2013  Time spent: >35 minutes  Recommendations for Outpatient Follow-up:  F/u with HD as scheduled F/u with [facility] outpatient week  Discharge Diagnoses:  Principal Problem:   Vascular graft infection Active Problems:   ESRD (end stage renal disease) on dialysis   Type I (juvenile type) diabetes mellitus without mention of complication, uncontrolled   Unspecified hypothyroidism   Unspecified essential hypertension   DVT of axillary vein, acute right   Protein-calorie malnutrition, severe   Diabetes mellitus   Discharge Condition: stable   Diet recommendation: DM  Filed Weights   04/19/13 2143 04/20/13 2118 04/21/13 0818  Weight: 62.052 kg (136 lb 12.8 oz) 64.774 kg (142 lb 12.8 oz) 62.9 kg (138 lb 10.7 oz)    History of present illness:  58 y.o. male with Past medical history of ESRD on hemodialysis, CVA, GI bleed, pulmonary edema, dyslipidemia, hypothyroidism, hypertension, GERD, recent admissions for graft failure admitted with graft infection    Hospital Course:  Principal Problem:  1. 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 Was on IV antibiotics empiric. Neg cultures; d/c atx ;f/u with [facility] outpatient  Active Problems:  2. ESRD (end stage renal disease) on dialysis: HD per nephrology; .cont as scheduled  3. Type I (juvenile type) diabetes mellitus without mention of complication, uncontrolled: insulin regimen   4. hypothyroidism: TSH markedly elevated and see no record of patient on Synthroid. Discharge summary approximately 6 weeks ago, patient is supposed to be on 168 mcg and given markedly elevated TSH, I suspect that he has not been taking his medications.  -recheck tsh in 4 weeks  5. Unspecified essential hypertension: resume amlodipine, increased BB, resume clonidine  6. DVT of axillary  vein, acute right: On Coumadin.  7. Protein-calorie malnutrition, severe: Patient meets criteria in the context of chronic illness  Patient being d/c today after HD   Procedures:  HD (i.e. Studies not automatically included, echos, thoracentesis, etc; not x-rays)  Consultations:  VVS  Nephrology   Discharge Exam: Filed Vitals:   04/21/13 0853  BP: 154/78  Pulse: 99  Temp:   Resp:     General: alert Cardiovascular: s1,s2 rrr Respiratory: CTA BL  Discharge Instructions  Discharge Orders   Future Appointments Provider Department Dept Phone   04/23/2013 3:00 PM Chuck Hint, MD [facility] [phone]   Future Orders Complete By Expires   Diet - low sodium heart healthy  As directed    Discharge instructions  As directed    Comments:     Please follow up with hemodialysis as scheduled  Please follow up with primary care doctor in 1-2 weeks   Increase activity slowly  As directed        Medication List         amLODipine 10 MG tablet  Commonly known as:  NORVASC  Take 1 tablet (10 mg total) by mouth at bedtime.     aspirin EC 81 MG tablet  Take 81 mg by mouth daily.     calcium acetate 667 MG capsule  Commonly known as:  PHOSLO  Take 1,334 mg by mouth 3 (three) times daily with meals.     cloNIDine 0.1 MG tablet  Commonly known as:  CATAPRES  Take 1 tablet (0.1 mg total) by mouth 3 (three) times daily.     docusate sodium 100 MG  capsule  Commonly known as:  COLACE  Take 100 mg by mouth 2 (two) times daily.     EASY TOUCH INSULIN SYRINGE 31G X 5/16" 0.5 ML Misc  Generic drug:  Insulin Syringe-Needle U-100     FLUoxetine 10 MG tablet  Commonly known as:  PROZAC  Take 0.5 tablets (5 mg total) by mouth every morning.     HYDROcodone-acetaminophen 5-325 MG per tablet  Commonly known as:  NORCO/VICODIN  Take 1-2 tablets by mouth every 4 (four) hours as needed for moderate pain.     insulin aspart 100 UNIT/ML  injection  Commonly known as:  novoLOG  Inject 2-3 Units into the skin 3 (three) times daily before meals. Inject 2 units with breakfast, 3 units before lunch, and 3 units before supper     insulin glargine 100 UNIT/ML injection  Commonly known as:  LANTUS  Inject 0.05 mLs (5 Units total) into the skin at bedtime. Inject 13 units in the morning, and 9 units in the evening.     levothyroxine 112 MCG tablet  Commonly known as:  SYNTHROID, LEVOTHROID  Take 1.5 tablets (168 mcg total) by mouth daily before breakfast.     metoprolol 50 MG tablet  Commonly known as:  LOPRESSOR  Take 1.5 tablets (75 mg total) by mouth 2 (two) times daily. Hold morning dose on dialysis days     omeprazole 20 MG capsule  Commonly known as:  PRILOSEC  Take 20 mg by mouth daily.     pravastatin 10 MG tablet  Commonly known as:  PRAVACHOL  Take 10 mg by mouth at bedtime.     PROAIR HFA 108 (90 BASE) MCG/ACT inhaler  Generic drug:  albuterol  Inhale 2 puffs into the lungs every 4 (four) hours as needed for wheezing or shortness of breath.     promethazine 25 MG tablet  Commonly known as:  PHENERGAN  Take 25 mg by mouth every 6 (six) hours as needed. For nausea     sevelamer carbonate 800 MG tablet  Commonly known as:  RENVELA  Take 800 mg by mouth 3 (three) times daily with meals.     temazepam 15 MG capsule  Commonly known as:  RESTORIL  Take 15 mg by mouth 4 (four) times a week. Monday, Wednesday, Friday, saturday     warfarin 7.5 MG tablet  Commonly known as:  COUMADIN  Take 1 tablet (7.5 mg total) by mouth daily. To Start after AVF placed on Friday       Allergies  Allergen Reactions  . Penicillins Other (See Comments)    Unknown reaction       Follow-up Information   Follow up with Josephina Gip, MD In 1 week.   Specialty:  Vascular Surgery   Contact information:   [address] [place] [address] [phone]       Follow up with Galvin Proffer, MD In 1 week.   Specialty:   Internal Medicine   Contact information:   [address] [place] [address] [phone]        The results of significant diagnostics from this hospitalization (including imaging, microbiology, ancillary and laboratory) are listed below for reference.    Significant Diagnostic Studies: No results found.  Microbiology: Recent Results (from the past 240 hour(s))  SURGICAL PCR SCREEN     Status: None   Collection Time    04/16/13 12:11 AM      Result Value Range Status   MRSA,  PCR NEGATIVE  NEGATIVE Final   Staphylococcus aureus NEGATIVE  NEGATIVE Final   Comment:            The Xpert SA Assay (FDA     approved for NASAL specimens     in patients over 58 years of age),     is one component of     a comprehensive surveillance     program.  Test performance has     been validated by [facility] for patients greater     than or equal to 58 year old.     It is not intended     to diagnose infection nor to     guide or monitor treatment.  CULTURE, BLOOD (ROUTINE X 2)     Status: None   Collection Time    04/16/13  1:41 AM      Result Value Range Status   Specimen Description BLOOD LEFT ARM   Final   Special Requests BOTTLES DRAWN AEROBIC ONLY 3CC   Final   Culture  Setup Time     Final   Value: 04/16/2013 09:02     Performed at [facility]   Culture     Final   Value:        BLOOD CULTURE RECEIVED NO GROWTH TO DATE CULTURE WILL BE HELD FOR 5 DAYS BEFORE ISSUING A FINAL NEGATIVE REPORT     Performed at [facility]   Report Status PENDING   Incomplete  CULTURE, BLOOD (ROUTINE X 2)     Status: None   Collection Time    04/16/13  1:51 AM      Result Value Range Status   Specimen Description BLOOD LEFT HAND   Final   Special Requests BOTTLES DRAWN AEROBIC ONLY 2CC   Final   Culture  Setup Time     Final   Value: 04/16/2013 09:01     Performed at [facility]   Culture     Final   Value:        BLOOD CULTURE RECEIVED NO GROWTH TO  DATE CULTURE WILL BE HELD FOR 5 DAYS BEFORE ISSUING A FINAL NEGATIVE REPORT     Performed at [facility]   Report Status PENDING   Incomplete  WOUND CULTURE     Status: None   Collection Time    04/16/13 11:45 AM      Result Value Range Status   Specimen Description WOUND LEFT GROIN   Final   Special Requests SWAB OF LEFT GROIN WOUND PT ON VANCPMYCIN   Final   Gram Stain     Final   Value: RARE WBC PRESENT, PREDOMINANTLY PMN     RARE SQUAMOUS EPITHELIAL CELLS PRESENT     NO ORGANISMS SEEN     Performed at [facility]   Culture     Final   Value: NO GROWTH 2 DAYS     Performed at [facility]   Report Status 04/19/2013 FINAL   Final  ANAEROBIC CULTURE     Status: None   Collection Time    04/16/13 11:45 AM      Result Value Range Status   Specimen Description WOUND LEFT GROIN   Final   Special Requests SWAB ON LEFT GROIN WOUND PT ON VANCOMYCIN   Final   Gram Stain     Final   Value: RARE WBC PRESENT, PREDOMINANTLY PMN     RARE SQUAMOUS  EPITHELIAL CELLS PRESENT     NO ORGANISMS SEEN     Performed at [facility]   Culture     Final   Value: NO ANAEROBES ISOLATED; CULTURE IN PROGRESS FOR 5 DAYS     Performed at [facility]   Report Status PENDING   Incomplete     Labs: Basic Metabolic Panel:  Recent Labs Lab 04/16/13 0141 04/17/13 0525 04/18/13 0750 04/21/13 0920  NA 133* 129* 129* 129*  K 3.7 3.8 4.2 4.9  CL 91* 88* 88* 89*  CO2 26 21 25 23   GLUCOSE 202* 195* 169* 249*  BUN 22 32* 38* 32*  CREATININE 4.83* 6.75* 8.06* 8.48*  CALCIUM 8.4 8.0* 7.8* 8.5  PHOS 2.3  --  4.3 5.4*   Liver Function Tests:  Recent Labs Lab 04/16/13 0141 04/18/13 0750 04/21/13 0920  AST 9  --   --   ALT <5  --   --   ALKPHOS 95  --   --   BILITOT 0.3  --   --   PROT 9.2*  --   --   ALBUMIN 2.8* 2.4* 2.5*   No results found for this basename: LIPASE, AMYLASE,  in the last 168 hours No results found for this basename: AMMONIA,  in the  last 168 hours CBC:  Recent Labs Lab 04/16/13 0141  04/18/13 0500 04/18/13 1946 04/19/13 0407 04/20/13 0542 04/21/13 0557  WBC 8.8  < > 8.3 7.1 7.8 8.5 6.9  NEUTROABS 6.4  --   --   --   --   --   --   HGB 11.5*  < > 9.5* 9.6* 9.4* 9.5* 9.8*  HCT 36.5*  < > 29.9* 30.4* 29.8* 30.1* 30.4*  MCV 96.1  < > 93.1 94.1 94.0 94.4 93.0  PLT 269  < > 347 371 362 376 401*  < > = values in this interval not displayed. Cardiac Enzymes:  Recent Labs Lab 04/20/13 1230  TROPONINI <0.30   BNP: BNP (last 3 results) No results found for this basename: PROBNP,  in the last 8760 hours CBG:  Recent Labs Lab 04/20/13 0801 04/20/13 1112 04/20/13 1625 04/20/13 2202 04/21/13 0744  GLUCAP 203* 256* 156* 138* 216*       Signed:  Jonette Mate N  [facility] 04/21/2013, 11:49 AM

## 2013-04-21 NOTE — Progress Notes (Signed)
[facility] [place]  Subjective  - Doing fine over all.  No new complaints.   Objective 159/71 73 99 F (37.2 C) (Oral) 19 99%  Intake/Output Summary (Last 24 hours) at 04/21/13 1535 Last data filed at 04/21/13 [id number]  Gross per 24 hour  Intake     60 ml  Output      0 ml  Net     60 ml    Left thigh superior incision with minimal drainage No erythema or hematoma Stump in warm   Assessment/Planning: Status post removal of left femoral Gore-Tex graft Final intraoperative cultures show no growth Plan BID dry dressing changes to left thigh F/U with Dr. Myra GianottiBrabham in our office in 1-2 weeks     Marcus EdisCOLLINS, [facility] HospitalEM[facility]UREEN 04/21/2013 3:35 PM --  Laboratory Lab Results:  Recent Labs  04/20/13 0542 04/21/13 0557  WBC 8.5 6.9  HGB 9.5* 9.8*  HCT 30.1* 30.4*  PLT 376 401*   BMET  Recent Labs  04/21/13 0920  NA 129*  K 4.9  CL 89*  CO2 23  GLUCOSE 249*  BUN 32*  CREATININE 8.48*  CALCIUM 8.5    COAG Lab Results  Component Value Date   INR 2.34* 04/21/2013   INR 1.94* 04/20/2013   INR 1.61* 04/19/2013   No results found for this basename: PTT

## 2013-04-21 NOTE — Progress Notes (Signed)
Subjective:  No cos for hd tioday Objective Vital signs in last 24 hours: Filed Vitals:   04/20/13 0802 04/20/13 1810 04/20/13 2118 04/21/13 0516  BP: 171/85 127/74 143/67 179/84  Pulse: 73 67 66 67  Temp: 98.5 F (36.9 C) 97.3 F (36.3 C) 98.2 F (36.8 C) 97.9 F (36.6 C)  TempSrc: Oral Oral Oral Oral  Resp: 18 18 16 20   Height:      Weight:   64.774 kg (142 lb 12.8 oz)   SpO2: 99% 98% 99% 100%   Weight change: 2.722 kg (6 lb)  Labs: Basic Metabolic Panel:  Recent Labs Lab 04/16/13 0141 04/17/13 0525 04/18/13 0750  NA 133* 129* 129*  K 3.7 3.8 4.2  CL 91* 88* 88*  CO2 26 21 25   GLUCOSE 202* 195* 169*  BUN 22 32* 38*  CREATININE 4.83* 6.75* 8.06*  CALCIUM 8.4 8.0* 7.8*  PHOS 2.3  --  4.3   Liver Function Tests:  Recent Labs Lab 04/16/13 0141 04/18/13 0750  AST 9  --   ALT <5  --   ALKPHOS 95  --   BILITOT 0.3  --   PROT 9.2*  --   ALBUMIN 2.8* 2.4*    CBC:  Recent Labs Lab 04/16/13 0141  04/18/13 0500 04/18/13 1946 04/19/13 0407 04/20/13 0542 04/21/13 0557  WBC 8.8  < > 8.3 7.1 7.8 8.5 6.9  NEUTROABS 6.4  --   --   --   --   --   --   HGB 11.5*  < > 9.5* 9.6* 9.4* 9.5* 9.8*  HCT 36.5*  < > 29.9* 30.4* 29.8* 30.1* 30.4*  MCV 96.1  < > 93.1 94.1 94.0 94.4 93.0  PLT 269  < > 347 371 362 376 401*  < > = values in this interval not displayed. Cardiac Enzymes:  Recent Labs Lab 04/20/13 1230  TROPONINI <0.30   CBG:  Recent Labs Lab 04/19/13 2148 04/20/13 0801 04/20/13 1112 04/20/13 1625 04/20/13 2202  GLUCAP 188* 203* 256* 156* 138*    Studies/Results: No results found. Medications: . heparin Stopped (04/20/13 1426)   . amLODipine  10 mg Oral Daily  . aspirin EC  81 mg Oral Daily  . ceFEPime (MAXIPIME) IV  2 g Intravenous Q M,W,F-HD  . cloNIDine  0.1 mg Oral TID  . darbepoetin (ARANESP) injection - DIALYSIS  150 mcg Intravenous Q Fri-HD  . ferric gluconate (FERRLECIT/NULECIT) IV  62.5 mg Intravenous Q Mon-HD  . FLUoxetine  5  mg Oral Daily  . insulin aspart  0-5 Units Subcutaneous QHS  . insulin aspart  0-9 Units Subcutaneous TID WC  . levothyroxine  168 mcg Oral QAC breakfast  . metoprolol  75 mg Oral BID  . multivitamin  1 tablet Oral QHS  . pantoprazole  20 mg Oral Daily  . polyethylene glycol  17 g Oral Daily  . senna-docusate  1 tablet Oral BID  . sodium chloride  3 mL Intravenous Q12H  . vancomycin  500 mg Intravenous Q M,W,F-HD  . Warfarin - Pharmacist Dosing Inpatient   Does not apply q1800    Physical Exam: General: NAD   Heart: RRR  Lungs:CTA   Abdomen:  bs pos. soft , non tender Extremities: no LE edema; left thigh dressing in place dry / clean/  Has Now developed some Right upper extremity edema Dialysis Access: right fem cath   Dialysis: [facility] MWF  4:15h 60kg 2K/2.25Ca [place] Heparin 2400 R fem permcath BFR 400  Hectorol none Epogen 18000 Venofer 50mg /wk  Other Labs op= 56 pth, hgb 10.4<9.8 TFS 38%   Assessment/Plan:  1. Removal L thigh infected AVG: cultures all neg, on empiric Vanc and maxepime, per VVS/ ?? chk with vvs for antibiotics as op 2. ESRD: multiple access failures, using R fem cath. HD  Today and possible dc after 3. HTN/volume: volume excess by wts  And htn this am/wt 64.7 yesterday ? 4.7 above edw BUT BED WT Needs hoyer lift wt post hd/; now on home dose of norvasc, metoprolol and clonidine - challenge to 59 kg or 3 L per yesterdays hd orders 4. Anemia - continue ESA/ iron weekly; Hgb 9.8 on Aranesp 150 5. Metabolic bone disease - No vit . Holding renvela binder with phos 2.3 now up to 4.3/ fu as op  6. Recent RUE DVT 02/2013 ( axillary and jugular vein) - on heparin/coumadin 7. DM type 1 - per admit team 8. Hx CVA  Lenny Pastel, [place] [facility] Beeper [phone] 04/21/2013,8:02 AM  LOS: 6 days  I have seen and examined this patient and agree with plan as outlined. Note pt has additional progress note for today. Genavive Kubicki B,MD 04/21/2013 9:03 PM

## 2013-04-21 NOTE — Progress Notes (Signed)
NUTRITION FOLLOW-UP  DOCUMENTATION CODES Per approved criteria  -Severe malnutrition in the context of chronic illness   INTERVENTION: Add Nepro Shake po BID, each supplement provides 425 kcal and 19 grams protein. RD to continue to follow nutrition care plan.  NUTRITION DIAGNOSIS: Inadequate oral intake now r/t variable appetite AEB variable meal intake.  Goal: Intake to meet >90% of estimated nutrition needs.  Monitor:  weight trends, lab trends, I/O's, supplement tolerance  ASSESSMENT: PMHx significant for ESRD (HD on MWF), CVA x 2, cocaine abuse. Admitted with leg pain. Work-up reveals infected fluid collection around AV graft. Graft removed in OR. Cultures all negative.  TDC not working well in HD this morning, pt requires activase, and then plan to try again.  Currently on a Dysphagia 3 diet with thin liquids. Meal intake remains poor, pt consuming <50% of all meals for the past 3 days. Pt currently eating breakfast, hasn't eaten anything yet, but says that he plans to. Agreeable to Nepro Shakes.  Labs reviewed: Sodium is low at 129 and trending down. Potassium WNL Phosphorus WNL CBG's elevated: 156, 138, 216   Height: Ht Readings from Last 1 Encounters:  04/16/13 6\' 2"  (1.88 m)    Weight: Wt Readings from Last 1 Encounters:  04/21/13 138 lb 10.7 oz (62.9 kg)  Admit wt 129 lb  BMI:  Body mass index is 17.8 kg/(m^2). Underweight  Estimated Nutritional Needs: Kcal: 1800 - 2000 Protein: 75 - 90 g Fluid: 2 liters  Skin:  blister to R foot Groin and thigh incision  Diet Order: Dysphagia 3; thin liquids - Renal and Carbohydrate Modified Restrictions; 1200 ml fluid restrictions  EDUCATION NEEDS: -No education needs identified at this time   Intake/Output Summary (Last 24 hours) at 04/21/13 0952 Last data filed at 04/21/13 0905  Gross per 24 hour  Intake 186.37 ml  Output      0 ml  Net 186.37 ml    Last BM: 1/2 (per pt)  Labs:   Recent Labs Lab  04/16/13 0141 04/17/13 0525 04/18/13 0750  NA 133* 129* 129*  K 3.7 3.8 4.2  CL 91* 88* 88*  CO2 26 21 25   BUN 22 32* 38*  CREATININE 4.83* 6.75* 8.06*  CALCIUM 8.4 8.0* 7.8*  PHOS 2.3  --  4.3  GLUCOSE 202* 195* 169*    CBG (last 3)   Recent Labs  04/20/13 1625 04/20/13 2202 04/21/13 0744  GLUCAP 156* 138* 216*    Scheduled Meds: . alteplase  6 mg Intracatheter Once  . amLODipine  10 mg Oral Daily  . aspirin EC  81 mg Oral Daily  . ceFEPime (MAXIPIME) IV  2 g Intravenous Q M,W,F-HD  . cloNIDine  0.1 mg Oral TID  . darbepoetin (ARANESP) injection - DIALYSIS  150 mcg Intravenous Q Fri-HD  . ferric gluconate (FERRLECIT/NULECIT) IV  62.5 mg Intravenous Q Mon-HD  . FLUoxetine  5 mg Oral Daily  . [START ON 04/22/2013] heparin  2,400 Units Dialysis Once in dialysis  . insulin aspart  0-5 Units Subcutaneous QHS  . insulin aspart  0-9 Units Subcutaneous TID WC  . levothyroxine  168 mcg Oral QAC breakfast  . metoprolol  75 mg Oral BID  . multivitamin  1 tablet Oral QHS  . pantoprazole  20 mg Oral Daily  . polyethylene glycol  17 g Oral Daily  . senna-docusate  1 tablet Oral BID  . sodium chloride  3 mL Intravenous Q12H  . vancomycin  500 mg Intravenous  Q M,W,F-HD  . warfarin  7.5 mg Oral ONCE-1800  . Warfarin - Pharmacist Dosing Inpatient   Does not apply q1800    Continuous Infusions:   Jarold Motto MS, RD, LDN Pager: [phone] After-hours pager: [phone]

## 2013-04-21 NOTE — Evaluation (Signed)
Occupational Therapy Evaluation Patient Details Name: Marcus [name] MRN: [id number] DOB: Aug 18, 1955 Today's Date: 04/21/2013 Time: 7209-4709 OT Time Calculation (min): 43 min  OT Assessment / Plan / Recommendation History of present illness Pt is a 58 y.o adm with PMH of end stage renal disease. He was being anticoagulated for his DVT when he developed acute bleeding from his dialysis graft in his Lt thigh. Pt with RIJ thrombosis as well as collapse of Rt innominate stent. Pt now has new Rt femoral vein cannulation which he received on 11/17.    Clinical Impression   Pt. Is requiring A with mobility and ADL's. Pt. Is refusing SNF placement and states family can A at d/c. Pt. Has decreased cognition and would benefit from 4 hour care at d/c. Pt. Is d/c from hospital today or tomorrow. Pt. Would [facility] from [facility].     OT Assessment  All further OT needs can be met in the next venue of care    Follow Up Recommendations  Home health OT    Barriers to Discharge      Equipment Recommendations  None recommended by OT    Recommendations for Other Services    Frequency       Precautions / Restrictions Precautions Precautions: Fall Restrictions Weight Bearing Restrictions: No   Pertinent Vitals/Pain No c/o    ADL  Eating/Feeding: Simulated;Independent Where Assessed - Eating/Feeding: Bed level Grooming: Performed;Wash/dry face;Brushing hair Where Assessed - Grooming: Unsupported sitting Upper Body Bathing: Simulated;Set up Where Assessed - Upper Body Bathing: Unsupported sitting Lower Body Bathing: Simulated;Min guard Where Assessed - Lower Body Bathing: Unsupported sitting Upper Body Dressing: Performed;Set up Where Assessed - Upper Body Dressing: Unsupported sitting Lower Body Dressing: Min guard Where Assessed - Lower Body Dressing: Unsupported sitting;Supported standing Toilet Transfer: Simulated;Min guard Toilet Transfer Method: Stand pivot ADL Comments: Pt. deferred  sitting in chair    OT Diagnosis: Generalized weakness  OT Problem List:   OT Treatment Interventions:     OT Goals(Current goals can be found in the care plan section) Acute Rehab OT Goals Patient Stated Goal: go home today  Visit Information  Last OT Received On: 04/21/13 History of Present Illness: Pt is a 58 y.o adm with PMH of end stage renal disease. He was being anticoagulated for his DVT when he developed acute bleeding from his dialysis graft in his Lt thigh. Pt with RIJ thrombosis as well as collapse of Rt innominate stent. Pt now has new Rt femoral vein cannulation which he received on 11/17.        Prior Functioning     Home Living Family/patient expects to be discharged to:: Private residence Living Arrangements: Other relatives;Other (Comment) Available Help at Discharge: Family;Friend(s);Available PRN/intermittently Type of Home: Apartment Home Access: Stairs to enter Entrance Stairs-Number of Steps: 4 Entrance Stairs-Rails: Right Home Layout: One level Home Equipment: Walker - 2 wheels Prior Function Level of Independence: Independent with assistive device(s) Comments: pt becomes agitated when being asked questions regarding PLOF  Communication Communication: No difficulties Dominant Hand: Right         Vision/Perception Vision - History Baseline Vision: Wears glasses only for reading Vision - Assessment Additional Comments: Pt. states that he is supposed to get new glasses   Cognition  Cognition Arousal/Alertness: Awake/alert Behavior During Therapy: Anxious;Agitated Overall Cognitive Status: Impaired/Different from baseline Area of Impairment: Orientation;Safety/judgement;Following commands;Problem solving Memory: Decreased short-term memory Following Commands: Follows one step commands consistently;Follows one step commands with increased time Safety/Judgement: Decreased awareness of deficits;Decreased awareness  of safety Problem Solving:  Difficulty sequencing;Slow processing;Requires verbal cues;Requires tactile cues    Extremity/Trunk Assessment Upper Extremity Assessment Upper Extremity Assessment: Generalized weakness     Mobility Transfers Sit to Stand: 5: Supervision Stand to Sit: 5: Supervision     Exercise     Balance     End of Session OT - End of Session Activity Tolerance: Patient limited by fatigue Patient left: in bed;with call bell/phone within reach  [place] 04/21/2013, 4:04 PM

## 2013-04-22 ENCOUNTER — Encounter: Payer: Self-pay | Admitting: Vascular Surgery

## 2013-04-22 ENCOUNTER — Telehealth: Payer: Self-pay | Admitting: Surgery

## 2013-04-22 LAB — CULTURE, BLOOD (ROUTINE X 2)
Culture: NO GROWTH
Culture: NO GROWTH

## 2013-04-22 NOTE — Progress Notes (Signed)
Discharge instruction,d/c paper and prescription given to patient and caregiver,patient and caregiver verbalized  understanding. Dawnna Gritz Joselita,RN

## 2013-04-22 NOTE — Telephone Encounter (Addendum)
Message copied by Fredrich BirksMILLIKAN, DANA P on Tue Apr 22, 2013  3:54 PM ------      Message from: [place], [place]      Created: Mon Apr 21, 2013  3:42 PM       F/U with Dr. Myra GianottiBrabham in 1-2 weeks s/p removal thigh graft( infection). ------  04/22/13: lm for pt, dpm

## 2013-04-23 ENCOUNTER — Encounter: Payer: Medicare Other | Admitting: Vascular Surgery

## 2013-04-25 ENCOUNTER — Encounter: Payer: Self-pay | Admitting: Surgery

## 2013-04-28 ENCOUNTER — Encounter: Payer: Medicare Other | Admitting: Surgery

## 2013-04-29 ENCOUNTER — Encounter: Payer: Self-pay | Admitting: Vascular Surgery

## 2013-04-29 ENCOUNTER — Ambulatory Visit (INDEPENDENT_AMBULATORY_CARE_PROVIDER_SITE_OTHER): Payer: Self-pay | Admitting: Vascular Surgery

## 2013-04-29 VITALS — BP 205/93 | HR 65 | Ht 74.0 in | Wt 137.6 lb

## 2013-04-29 DIAGNOSIS — N186 End stage renal disease: Secondary | ICD-10-CM

## 2013-04-29 NOTE — Progress Notes (Signed)
H. presents today for followup of removal of infected left femoral loop graft by Dr. Myra GianottiBrabham on 12 /31/2014. He was unable to make the appointment yesterday with Dr. Myra GianottiBrabham. He does report some soreness but this is improving. He is dialyzing adequately with a right femoral catheter.  On physical exam his incisions are healing. Does have some fluid in the tract but no evidence of erythema. He does have staples in place. He does have some old dark blood drainage from the groin incision itself but no evidence of erythema  Thank you he will have his staples removed today and will be seen again in 2-3 weeks for continued followup with Dr. Dennie FettersBrabham. We'll continue to use the right femoral catheter until other options are available. Discussed with the patient and his family that he is certainly running out of options for hemodialysis access

## 2013-05-09 ENCOUNTER — Encounter: Payer: Self-pay | Admitting: Surgery

## 2013-05-12 ENCOUNTER — Encounter: Payer: Self-pay | Admitting: Surgery

## 2013-05-12 ENCOUNTER — Ambulatory Visit (INDEPENDENT_AMBULATORY_CARE_PROVIDER_SITE_OTHER): Payer: Self-pay | Admitting: Surgery

## 2013-05-12 VITALS — BP 154/90 | HR 64 | Resp 16 | Ht 74.0 in | Wt 135.0 lb

## 2013-05-12 DIAGNOSIS — Z48812 Encounter for surgical aftercare following surgery on the circulatory system: Secondary | ICD-10-CM | POA: Insufficient documentation

## 2013-05-12 DIAGNOSIS — N186 End stage renal disease: Secondary | ICD-10-CM

## 2013-05-12 NOTE — Progress Notes (Signed)
Patient name: Marcus [name] MRN: [id number] DOB: 02-Feb-1956 Sex: male     Chief Complaint  Patient presents with  . 'ESRD    2-3 WK f/UP Removal  Left AVGG/ Angioplasty  Left  Fem. Artery 04-16-13 04-16-13    HISTORY OF PRESENT ILLNESS: The patient recently underwent removal of an infected left thigh graft.  He has an existing catheter in his right thigh.  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  Past Medical History  Diagnosis Date  . Hypothyroidism   . Hyperlipidemia   . CVA (cerebrovascular accident)     hx CVA x 2  . GI bleed     2006, mallory-weiss tear  . Pulmonary edema     nov 2011, dry wt decreased  . Goiter     by CT Apr 2013  . Depression   . Cocaine abuse     past history  . Tobacco abuse     history of  . GERD (gastroesophageal reflux disease)   . Cataract   . Insomnia   . Chronic pain   . HTN (hypertension)     sees Dr. Ardelle Park, Rosalita Levan  . Neuromuscular disorder     diabetic neuropathy  . Occlusion of carotid stent     Per patients nursing aid  . ESRD (end stage renal disease) on dialysis     hd - [facility] - [place]  . Pneumonia     hx of  . Diabetes mellitus     Sees Dr. Reather Littler [phone] -- fasting blood 70-110s  . DKA (diabetic ketoacidosis)     severe, Dec 2011  . History of blood transfusion   . On home oxygen therapy     2l nasal cannula at night  . Hemodialysis patient     M, W, F  . CHF (congestive heart failure)   . DVT (deep venous thrombosis)     Past Surgical History  Procedure Laterality Date  . Av fistula placement  03/25/2008    right forearm  . Av fistula placement  04/23/2012    Procedure: INSERTION OF ARTERIOVENOUS (AV) GORE-TEX GRAFT THIGH;  Surgeon: Sherren Kerns, MD;  Location: [facility] OR;  Service: Vascular;  Laterality: Left;  . Arteriovenous graft placement Left     thigh  . Carotid stent insertion Right   . Dyalisis catheter    . Eye surgery Bilateral     cataracts  . Thyroid surgery   01/28/2013    Dr Gerrit Friends  . Lymph node biopsy Right 01/28/2013    Procedure: LYMPH NODE BIOPSY;  Surgeon: Velora Heckler, MD;  Location: [facility] OR;  Service: General;  Laterality: Right;  . Mass biopsy N/A 01/28/2013    Procedure: NECK EXPLORATION;  Surgeon: Velora Heckler, MD;  Location: [facility] OR;  Service: General;  Laterality: N/A;  . Ligation arteriovenous gortex graft Right 02/27/2013    Procedure: LIGATION ARTERIOVENOUS GORTEX GRAFT;  Surgeon: Chuck Hint, MD;  Location: [facility] OR;  Service: Vascular;  Laterality: Right;  . Ligation arteriovenous gortex graft Left 03/03/2013    Procedure: LIGATION Left ARTERIOVENOUS GORTEX Thigh GRAFT;  Surgeon: Fransisco Hertz, MD;  Location: [facility] OR;  Service: Vascular;  Laterality: Left;  . Insertion of dialysis catheter Right 03/03/2013    Procedure: INSERTION OF DIALYSIS CATHETER;  Surgeon: Fransisco Hertz, MD;  Location: [facility] OR;  Service: Vascular;  Laterality: Right;  . Av fistula placement Left 03/14/2013    Procedure: INSERTION OF  ARTERIOVENOUS (AV) GORE-TEX GRAFT  LEFT THIGH;  Surgeon: Larina Earthly, MD;  Location: [facility] OR;  Service: Vascular;  Laterality: Left;  . Avgg removal Left 04/16/2013    Procedure: REMOVAL OF ARTERIOVENOUS GORETEX GRAFT Left Thigh;  Surgeon: Nada Libman, MD;  Location: [facility] OR;  Service: Vascular;  Laterality: Left;  . Patch angioplasty Left 04/16/2013    Procedure: Patch Angioplasty of left femoral artery;  Surgeon: Nada Libman, MD;  Location: [facility] OR;  Service: Vascular;  Laterality: Left;    History   Social History  . Marital Status: Married    Spouse Name: N/A    Number of Children: N/A  . Years of Education: N/A   Occupational History  . Not on file.   Social History Main Topics  . Smoking status: Current Every Day Smoker -- 0.25 packs/day    Types: Cigarettes  . Smokeless tobacco: Never Used     Comment: pt states that he has been thinking about giving it up  . Alcohol Use: No  . Drug Use: No  . Sexual  Activity: Not on file   Other Topics Concern  . Not on file   Social History Narrative  . No narrative on file    Family History  Problem Relation Age of Onset  . Diabetes Sister   . Diabetes Brother   . Hypertension Brother   . Hypertension Mother   . Heart attack Mother   . Hypertension Father     Allergies as of 05/12/2013 - Review Complete 05/12/2013  Allergen Reaction Noted  . Penicillins Other (See Comments) 09/22/2011    Current Outpatient Prescriptions on File Prior to Visit  Medication Sig Dispense Refill  . amLODipine (NORVASC) 10 MG tablet Take 1 tablet (10 mg total) by mouth at bedtime.  30 tablet  2  . aspirin EC 81 MG tablet Take 81 mg by mouth daily.      . calcium acetate (PHOSLO) 667 MG capsule Take 1,334 mg by mouth 3 (three) times daily with meals.      . cloNIDine (CATAPRES) 0.1 MG tablet Take 1 tablet (0.1 mg total) by mouth 3 (three) times daily.  60 tablet  11  . docusate sodium (COLACE) 100 MG capsule Take 100 mg by mouth 2 (two) times daily.       Marland Kitchen EASY TOUCH INSULIN SYRINGE 31G X 5/16" 0.5 ML MISC       . FLUoxetine (PROZAC) 10 MG tablet Take 0.5 tablets (5 mg total) by mouth every morning.  30 tablet  3  . HYDROcodone-acetaminophen (NORCO/VICODIN) 5-325 MG per tablet Take 1-2 tablets by mouth every 4 (four) hours as needed for moderate pain.  30 tablet  0  . insulin aspart (NOVOLOG) 100 UNIT/ML injection Inject 2-3 Units into the skin 3 (three) times daily before meals. Inject 2 units with breakfast, 3 units before lunch, and 3 units before supper  1 vial  12  . insulin glargine (LANTUS) 100 UNIT/ML injection Inject 0.05 mLs (5 Units total) into the skin at bedtime. Inject 13 units in the morning, and 9 units in the evening.  10 mL  12  . levothyroxine (SYNTHROID, LEVOTHROID) 112 MCG tablet Take 1.5 tablets (168 mcg total) by mouth daily before breakfast.  30 tablet  1  . metoprolol (LOPRESSOR) 50 MG tablet Take 1.5 tablets (75 mg total) by mouth 2  (two) times daily. Hold morning dose on dialysis days  60 tablet  2  . omeprazole (PRILOSEC)  20 MG capsule Take 20 mg by mouth daily.      . pravastatin (PRAVACHOL) 10 MG tablet Take 10 mg by mouth at bedtime.      Marland Kitchen. PROAIR HFA 108 (90 BASE) MCG/ACT inhaler Inhale 2 puffs into the lungs every 4 (four) hours as needed for wheezing or shortness of breath.       . promethazine (PHENERGAN) 25 MG tablet Take 25 mg by mouth every 6 (six) hours as needed. For nausea      . sevelamer carbonate (RENVELA) 800 MG tablet Take 800 mg by mouth 3 (three) times daily with meals.       . temazepam (RESTORIL) 15 MG capsule Take 15 mg by mouth 4 (four) times a week. Monday, Wednesday, Friday, saturday      . warfarin (COUMADIN) 7.5 MG tablet Take 1 tablet (7.5 mg total) by mouth daily. To Start after AVF placed on Friday  30 tablet  0   No current facility-administered medications on file prior to visit.     REVIEW OF SYSTEMS: Cardiovascular: No chest pain, chest pressure, palpitations, orthopnea, or dyspnea on exertion. No claudication or rest pain,  No history of DVT or phlebitis. Pulmonary: No productive cough, asthma or wheezing. Neurologic: No weakness, paresthesias, aphasia, or amaurosis. No dizziness. Hematologic: No bleeding problems or clotting disorders. Musculoskeletal: No joint pain or joint swelling. Gastrointestinal: No blood in stool or hematemesis Genitourinary: No dysuria or hematuria. Psychiatric:: No history of major depression. Integumentary: No rashes or ulcers. Constitutional: No fever or chills.  PHYSICAL EXAMINATION:   Vital signs are BP 154/90  Pulse 64  Resp 16  Ht 6\' 2"  (1.88 m)  Wt 135 lb (61.236 kg)  BMI 17.33 kg/m2  SpO2 100% General: The patient appears their stated age. HEENT:  No gross abnormalities Pulmonary:  Non labored breathing Abdomen: Soft and non-tender Musculoskeletal: There are no major deformities. Neurologic: No focal weakness or paresthesias are  detected, Skin: There are no ulcer or rashes noted. Psychiatric: The patient has normal affect. Cardiovascular: Incisions in the left groin or healing nicely   Diagnostic Studies None  Assessment: Status post removal of infected left thigh graft Plan: The patient's last access option is in the right side.  Currently he has a catheter in that location.  He is still healing his left femoral incision.  What I have recommended is that I removed the sutures and staples from his left leg today.  An approximately one month I will remove the catheter in the right groin and place it user in the left neck or in the left groin.  Once this has been done, I will wait another 2-4 weeks and then placed a right thigh graft.  He will need to be off of his Coumadin 5 days prior to his operation  V. Charlena CrossWells Chaselynn Kepple IV, M.D. [facility] Office: [phone] Pager:  [phone]

## 2013-05-15 ENCOUNTER — Other Ambulatory Visit: Payer: Self-pay | Admitting: *Deleted

## 2013-05-15 ENCOUNTER — Telehealth: Payer: Self-pay | Admitting: *Deleted

## 2013-05-15 NOTE — Telephone Encounter (Signed)
[name] evidently had blood sugar issues and fell 2 days ago according to his aide Marcus Cosierheresa. He has injured the groin incision where we took out his staples on 05-12-13 (04-16-13 left femoral AVGG removal for infection). She states that he has a open bleeding wound that is increasingly painful since his fall; does not know if he is running a fever. They will be taking him to the ED for further evaluation per the aide.

## 2013-05-19 ENCOUNTER — Inpatient Hospital Stay (HOSPITAL_COMMUNITY): Payer: Medicare Other

## 2013-05-19 ENCOUNTER — Encounter (HOSPITAL_COMMUNITY): Payer: Self-pay | Admitting: *Deleted

## 2013-05-19 ENCOUNTER — Inpatient Hospital Stay (HOSPITAL_COMMUNITY)
Admission: AD | Admit: 2013-05-19 | Discharge: 2013-05-28 | DRG: 299 | Disposition: A | Discharge status: Home Health | Payer: Medicare Other | Source: Other Acute Inpatient Hospital | Attending: Internal Medicine | Admitting: Internal Medicine

## 2013-05-19 DIAGNOSIS — Z91199 Patient's noncompliance with other medical treatment and regimen due to unspecified reason: Secondary | ICD-10-CM

## 2013-05-19 DIAGNOSIS — K219 Gastro-esophageal reflux disease without esophagitis: Secondary | ICD-10-CM | POA: Diagnosis present

## 2013-05-19 DIAGNOSIS — G4700 Insomnia, unspecified: Secondary | ICD-10-CM | POA: Diagnosis present

## 2013-05-19 DIAGNOSIS — I82409 Acute embolism and thrombosis of unspecified deep veins of unspecified lower extremity: Secondary | ICD-10-CM | POA: Diagnosis present

## 2013-05-19 DIAGNOSIS — D631 Anemia in chronic kidney disease: Secondary | ICD-10-CM | POA: Diagnosis present

## 2013-05-19 DIAGNOSIS — T383X5A Adverse effect of insulin and oral hypoglycemic [antidiabetic] drugs, initial encounter: Secondary | ICD-10-CM | POA: Diagnosis present

## 2013-05-19 DIAGNOSIS — N2581 Secondary hyperparathyroidism of renal origin: Secondary | ICD-10-CM | POA: Diagnosis present

## 2013-05-19 DIAGNOSIS — E785 Hyperlipidemia, unspecified: Secondary | ICD-10-CM | POA: Diagnosis present

## 2013-05-19 DIAGNOSIS — Z681 Body mass index (BMI) 19 or less, adult: Secondary | ICD-10-CM

## 2013-05-19 DIAGNOSIS — H269 Unspecified cataract: Secondary | ICD-10-CM | POA: Diagnosis present

## 2013-05-19 DIAGNOSIS — T827XXA Infection and inflammatory reaction due to other cardiac and vascular devices, implants and grafts, initial encounter: Secondary | ICD-10-CM

## 2013-05-19 DIAGNOSIS — E161 Other hypoglycemia: Secondary | ICD-10-CM

## 2013-05-19 DIAGNOSIS — E1049 Type 1 diabetes mellitus with other diabetic neurological complication: Secondary | ICD-10-CM | POA: Diagnosis present

## 2013-05-19 DIAGNOSIS — N186 End stage renal disease: Secondary | ICD-10-CM | POA: Diagnosis present

## 2013-05-19 DIAGNOSIS — J811 Chronic pulmonary edema: Secondary | ICD-10-CM

## 2013-05-19 DIAGNOSIS — E119 Type 2 diabetes mellitus without complications: Secondary | ICD-10-CM | POA: Diagnosis present

## 2013-05-19 DIAGNOSIS — Z794 Long term (current) use of insulin: Secondary | ICD-10-CM

## 2013-05-19 DIAGNOSIS — Z880 Allergy status to penicillin: Secondary | ICD-10-CM

## 2013-05-19 DIAGNOSIS — I82A11 Acute embolism and thrombosis of right axillary vein: Secondary | ICD-10-CM

## 2013-05-19 DIAGNOSIS — E049 Nontoxic goiter, unspecified: Secondary | ICD-10-CM | POA: Diagnosis present

## 2013-05-19 DIAGNOSIS — E160 Drug-induced hypoglycemia without coma: Secondary | ICD-10-CM | POA: Diagnosis present

## 2013-05-19 DIAGNOSIS — G8929 Other chronic pain: Secondary | ICD-10-CM | POA: Diagnosis present

## 2013-05-19 DIAGNOSIS — F1410 Cocaine abuse, uncomplicated: Secondary | ICD-10-CM | POA: Diagnosis present

## 2013-05-19 DIAGNOSIS — I82890 Acute embolism and thrombosis of other specified veins: Secondary | ICD-10-CM

## 2013-05-19 DIAGNOSIS — E1069 Type 1 diabetes mellitus with other specified complication: Secondary | ICD-10-CM

## 2013-05-19 DIAGNOSIS — Z9181 History of falling: Secondary | ICD-10-CM

## 2013-05-19 DIAGNOSIS — Z9119 Patient's noncompliance with other medical treatment and regimen: Secondary | ICD-10-CM

## 2013-05-19 DIAGNOSIS — I82B19 Acute embolism and thrombosis of unspecified subclavian vein: Secondary | ICD-10-CM | POA: Diagnosis present

## 2013-05-19 DIAGNOSIS — N039 Chronic nephritic syndrome with unspecified morphologic changes: Secondary | ICD-10-CM

## 2013-05-19 DIAGNOSIS — Z992 Dependence on renal dialysis: Secondary | ICD-10-CM

## 2013-05-19 DIAGNOSIS — Z8249 Family history of ischemic heart disease and other diseases of the circulatory system: Secondary | ICD-10-CM

## 2013-05-19 DIAGNOSIS — F17200 Nicotine dependence, unspecified, uncomplicated: Secondary | ICD-10-CM | POA: Diagnosis present

## 2013-05-19 DIAGNOSIS — Z833 Family history of diabetes mellitus: Secondary | ICD-10-CM

## 2013-05-19 DIAGNOSIS — E43 Unspecified severe protein-calorie malnutrition: Secondary | ICD-10-CM

## 2013-05-19 DIAGNOSIS — I10 Essential (primary) hypertension: Secondary | ICD-10-CM

## 2013-05-19 DIAGNOSIS — E039 Hypothyroidism, unspecified: Secondary | ICD-10-CM | POA: Diagnosis present

## 2013-05-19 DIAGNOSIS — Z8673 Personal history of transient ischemic attack (TIA), and cerebral infarction without residual deficits: Secondary | ICD-10-CM

## 2013-05-19 DIAGNOSIS — I120 Hypertensive chronic kidney disease with stage 5 chronic kidney disease or end stage renal disease: Secondary | ICD-10-CM | POA: Diagnosis present

## 2013-05-19 DIAGNOSIS — I509 Heart failure, unspecified: Secondary | ICD-10-CM | POA: Diagnosis present

## 2013-05-19 DIAGNOSIS — Z48812 Encounter for surgical aftercare following surgery on the circulatory system: Secondary | ICD-10-CM

## 2013-05-19 DIAGNOSIS — R110 Nausea: Secondary | ICD-10-CM | POA: Diagnosis not present

## 2013-05-19 DIAGNOSIS — E41 Nutritional marasmus: Secondary | ICD-10-CM | POA: Diagnosis present

## 2013-05-19 DIAGNOSIS — F3289 Other specified depressive episodes: Secondary | ICD-10-CM | POA: Diagnosis present

## 2013-05-19 DIAGNOSIS — E079 Disorder of thyroid, unspecified: Secondary | ICD-10-CM

## 2013-05-19 DIAGNOSIS — E1165 Type 2 diabetes mellitus with hyperglycemia: Secondary | ICD-10-CM

## 2013-05-19 DIAGNOSIS — E875 Hyperkalemia: Secondary | ICD-10-CM | POA: Diagnosis present

## 2013-05-19 DIAGNOSIS — I82629 Acute embolism and thrombosis of deep veins of unspecified upper extremity: Principal | ICD-10-CM | POA: Diagnosis present

## 2013-05-19 DIAGNOSIS — E1129 Type 2 diabetes mellitus with other diabetic kidney complication: Secondary | ICD-10-CM | POA: Diagnosis present

## 2013-05-19 DIAGNOSIS — E1065 Type 1 diabetes mellitus with hyperglycemia: Secondary | ICD-10-CM

## 2013-05-19 DIAGNOSIS — IMO0002 Reserved for concepts with insufficient information to code with codable children: Secondary | ICD-10-CM | POA: Diagnosis present

## 2013-05-19 DIAGNOSIS — F329 Major depressive disorder, single episode, unspecified: Secondary | ICD-10-CM | POA: Diagnosis present

## 2013-05-19 DIAGNOSIS — Z7901 Long term (current) use of anticoagulants: Secondary | ICD-10-CM

## 2013-05-19 DIAGNOSIS — Z86718 Personal history of other venous thrombosis and embolism: Secondary | ICD-10-CM

## 2013-05-19 DIAGNOSIS — E871 Hypo-osmolality and hyponatremia: Secondary | ICD-10-CM

## 2013-05-19 DIAGNOSIS — E1142 Type 2 diabetes mellitus with diabetic polyneuropathy: Secondary | ICD-10-CM | POA: Diagnosis present

## 2013-05-19 LAB — RENAL FUNCTION PANEL
Albumin: 3.1 g/dL — ABNORMAL LOW (ref 3.5–5.2)
Albumin: 2.9 g/dL — ABNORMAL LOW (ref 3.5–5.2)
BUN: 66 mg/dL — AB (ref 6–23)
BUN: 70 mg/dL — ABNORMAL HIGH (ref 6–23)
CALCIUM: 8.9 mg/dL (ref 8.4–10.5)
CALCIUM: 8 mg/dL — AB (ref 8.4–10.5)
CO2: 21 meq/L (ref 19–32)
CO2: 19 mEq/L (ref 19–32)
CREATININE: 8.85 mg/dL — AB (ref 0.50–1.35)
CREATININE: 9.11 mg/dL — AB (ref 0.50–1.35)
Chloride: 89 mEq/L — ABNORMAL LOW (ref 96–112)
Chloride: 86 mEq/L — ABNORMAL LOW (ref 96–112)
GFR calc Af Amer: 7 mL/min — ABNORMAL LOW (ref >90)
GFR calc Af Amer: 7 mL/min — ABNORMAL LOW (ref >90)
GFR calc non Af Amer: 6 mL/min — ABNORMAL LOW (ref >90)
GFR calc non Af Amer: 6 mL/min — ABNORMAL LOW (ref >90)
GLUCOSE: 115 mg/dL — AB (ref 70–99)
Glucose, Bld: 382 mg/dL — ABNORMAL HIGH (ref 70–99)
Phosphorus: 5.8 mg/dL — ABNORMAL HIGH (ref 2.3–4.6)
Phosphorus: 6.6 mg/dL — ABNORMAL HIGH (ref 2.3–4.6)
Potassium: 6.5 mEq/L (ref 3.7–5.3)
Potassium: >7.7 mEq/L (ref 3.7–5.3)
Sodium: 130 mEq/L — ABNORMAL LOW (ref 137–147)
Sodium: 125 mEq/L — ABNORMAL LOW (ref 137–147)

## 2013-05-19 LAB — CBC
HCT: 37.2 % — ABNORMAL LOW (ref 39.0–52.0)
HEMOGLOBIN: 12.3 g/dL — AB (ref 13.0–17.0)
MCH: 30 pg (ref 26.0–34.0)
MCHC: 33.1 g/dL (ref 30.0–36.0)
MCV: 90.7 fL (ref 78.0–100.0)
Platelets: 196 10*3/uL (ref 150–400)
RBC: 4.1 MIL/uL — AB (ref 4.22–5.81)
RDW: 15.8 % — ABNORMAL HIGH (ref 11.5–15.5)
WBC: 6.1 10*3/uL (ref 4.0–10.5)

## 2013-05-19 LAB — GLUCOSE, CAPILLARY
GLUCOSE-CAPILLARY: 274 mg/dL — AB (ref 70–99)
Glucose-Capillary: 66 mg/dL — ABNORMAL LOW (ref 70–99)
Glucose-Capillary: 115 mg/dL — ABNORMAL HIGH (ref 70–99)

## 2013-05-19 LAB — TSH: TSH: 34.208 u[IU]/mL — ABNORMAL HIGH (ref 0.350–4.500)

## 2013-05-19 LAB — PROTIME-INR
INR: 1.03 (ref 0.00–1.49)
Prothrombin Time: 13.3 seconds (ref 11.6–15.2)

## 2013-05-19 MED ORDER — HEPARIN SODIUM (PORCINE) 1000 UNIT/ML DIALYSIS: 1000.0000 [IU] | INTRAMUSCULAR | Status: DC | PRN

## 2013-05-19 MED ORDER — HEPARIN BOLUS VIA INFUSION
4000.0000 [IU] | Freq: Once | INTRAVENOUS | Status: AC
  Administered 2013-05-19: 4000 [IU] via INTRAVENOUS
  Filled 2013-05-19: qty 4000

## 2013-05-19 MED ORDER — NEPRO/CARBSTEADY PO LIQD: 237.0000 mL | ORAL | Status: DC | PRN

## 2013-05-19 MED ORDER — SODIUM CHLORIDE 0.9 % IJ SOLN
3.0000 mL | Freq: Two times a day (BID) | INTRAMUSCULAR | Status: DC
  Administered 2013-05-19 – 2013-05-22 (×2): 3 mL via INTRAVENOUS

## 2013-05-19 MED ORDER — AMLODIPINE BESYLATE 10 MG PO TABS
10.0000 mg | ORAL_TABLET | Freq: Every day | ORAL | Status: DC
  Administered 2013-05-19 – 2013-05-27 (×9): 10 mg via ORAL
  Filled 2013-05-19 (×10): qty 1

## 2013-05-19 MED ORDER — SIMVASTATIN 5 MG PO TABS
5.0000 mg | ORAL_TABLET | Freq: Every day | ORAL | Status: DC
  Administered 2013-05-20 – 2013-05-27 (×8): 5 mg via ORAL
  Filled 2013-05-19 (×11): qty 1

## 2013-05-19 MED ORDER — LEVOTHYROXINE SODIUM 112 MCG PO TABS
168.0000 ug | ORAL_TABLET | Freq: Every day | ORAL | Status: DC
  Administered 2013-05-20 – 2013-05-28 (×8): 168 ug via ORAL
  Filled 2013-05-19 (×10): qty 1.5

## 2013-05-19 MED ORDER — METOPROLOL TARTRATE 50 MG PO TABS
75.0000 mg | ORAL_TABLET | Freq: Two times a day (BID) | ORAL | Status: DC
  Administered 2013-05-19 – 2013-05-26 (×14): 75 mg via ORAL
  Filled 2013-05-19 (×17): qty 1

## 2013-05-19 MED ORDER — SEVELAMER CARBONATE 800 MG PO TABS
800.0000 mg | ORAL_TABLET | Freq: Three times a day (TID) | ORAL | Status: DC
  Administered 2013-05-19 – 2013-05-28 (×19): 800 mg via ORAL
  Filled 2013-05-19 (×29): qty 1

## 2013-05-19 MED ORDER — WARFARIN - PHARMACIST DOSING INPATIENT
Freq: Every day | Status: DC
  Administered 2013-05-24 – 2013-05-26 (×3)

## 2013-05-19 MED ORDER — ONDANSETRON HCL 4 MG PO TABS: 4.0000 mg | ORAL_TABLET | Freq: Four times a day (QID) | ORAL | Status: DC | PRN

## 2013-05-19 MED ORDER — DEXTROSE 50 % IV SOLN: 25.0000 mL | Freq: Once | INTRAVENOUS | Status: AC | PRN

## 2013-05-19 MED ORDER — SENNOSIDES-DOCUSATE SODIUM 8.6-50 MG PO TABS: 1.0000 | ORAL_TABLET | Freq: Every evening | ORAL | Status: DC | PRN

## 2013-05-19 MED ORDER — TEMAZEPAM 15 MG PO CAPS
15.0000 mg | ORAL_CAPSULE | ORAL | Status: DC
  Administered 2013-05-19 – 2013-05-26 (×4): 15 mg via ORAL
  Filled 2013-05-19 (×4): qty 1

## 2013-05-19 MED ORDER — INSULIN ASPART 100 UNIT/ML ~~LOC~~ SOLN: 0.0000 [IU] | Freq: Three times a day (TID) | SUBCUTANEOUS | Status: DC

## 2013-05-19 MED ORDER — GLUCOSE-VITAMIN C 4-6 GM-MG PO CHEW
4.0000 | CHEWABLE_TABLET | ORAL | Status: DC | PRN
  Administered 2013-05-19 – 2013-05-26 (×2): 4 via ORAL

## 2013-05-19 MED ORDER — SODIUM CHLORIDE 0.9 % IV SOLN
250.0000 mL | INTRAVENOUS | Status: DC | PRN
  Administered 2013-05-24: 250 mL via INTRAVENOUS

## 2013-05-19 MED ORDER — GLUCOSE 40 % PO GEL: 1.0000 | ORAL | Status: DC | PRN

## 2013-05-19 MED ORDER — CLONIDINE HCL 0.1 MG PO TABS
0.1000 mg | ORAL_TABLET | Freq: Three times a day (TID) | ORAL | Status: DC
  Administered 2013-05-19 – 2013-05-28 (×24): 0.1 mg via ORAL
  Filled 2013-05-19 (×32): qty 1

## 2013-05-19 MED ORDER — ALTEPLASE 2 MG IJ SOLR
2.0000 mg | Freq: Once | INTRAMUSCULAR | Status: AC | PRN
  Filled 2013-05-19: qty 2

## 2013-05-19 MED ORDER — PENTAFLUOROPROP-TETRAFLUOROETH EX AERO: 1.0000 "application " | INHALATION_SPRAY | CUTANEOUS | Status: DC | PRN

## 2013-05-19 MED ORDER — PANTOPRAZOLE SODIUM 40 MG PO TBEC
40.0000 mg | DELAYED_RELEASE_TABLET | Freq: Every day | ORAL | Status: DC
  Administered 2013-05-19 – 2013-05-28 (×10): 40 mg via ORAL
  Filled 2013-05-19 (×8): qty 1

## 2013-05-19 MED ORDER — ONDANSETRON HCL 4 MG/2ML IJ SOLN
4.0000 mg | Freq: Four times a day (QID) | INTRAMUSCULAR | Status: DC | PRN
  Administered 2013-05-21 (×3): 4 mg via INTRAVENOUS
  Filled 2013-05-19 (×2): qty 2

## 2013-05-19 MED ORDER — GLUCOSE-VITAMIN C 4-6 GM-MG PO CHEW
CHEWABLE_TABLET | ORAL | Status: AC
  Administered 2013-05-19: 4 via ORAL
  Filled 2013-05-19: qty 1

## 2013-05-19 MED ORDER — NA FERRIC GLUC CPLX IN SUCROSE 12.5 MG/ML IV SOLN
125.0000 mg | INTRAVENOUS | Status: DC
  Administered 2013-05-19 – 2013-05-28 (×5): 125 mg via INTRAVENOUS
  Filled 2013-05-19 (×11): qty 10

## 2013-05-19 MED ORDER — WARFARIN SODIUM 10 MG PO TABS
10.0000 mg | ORAL_TABLET | Freq: Once | ORAL | Status: AC
  Administered 2013-05-19: 10 mg via ORAL
  Filled 2013-05-19: qty 1

## 2013-05-19 MED ORDER — MORPHINE SULFATE 2 MG/ML IJ SOLN
1.0000 mg | INTRAMUSCULAR | Status: DC | PRN
  Administered 2013-05-27 (×3): 1 mg via INTRAVENOUS
  Filled 2013-05-19 (×2): qty 1

## 2013-05-19 MED ORDER — LIDOCAINE HCL (PF) 1 % IJ SOLN: 5.0000 mL | INTRAMUSCULAR | Status: DC | PRN

## 2013-05-19 MED ORDER — SODIUM CHLORIDE 0.9 % IV SOLN: 100.0000 mL | INTRAVENOUS | Status: DC | PRN

## 2013-05-19 MED ORDER — HYDRALAZINE HCL 20 MG/ML IJ SOLN
10.0000 mg | Freq: Four times a day (QID) | INTRAMUSCULAR | Status: DC | PRN
  Administered 2013-05-19 – 2013-05-25 (×2): 10 mg via INTRAVENOUS
  Filled 2013-05-19 (×2): qty 1

## 2013-05-19 MED ORDER — SODIUM CHLORIDE 0.9 % IJ SOLN
3.0000 mL | Freq: Two times a day (BID) | INTRAMUSCULAR | Status: DC
  Administered 2013-05-19: 3 mL via INTRAVENOUS

## 2013-05-19 MED ORDER — LIDOCAINE-PRILOCAINE 2.5-2.5 % EX CREA: 1.0000 "application " | TOPICAL_CREAM | CUTANEOUS | Status: DC | PRN

## 2013-05-19 MED ORDER — HEPARIN (PORCINE) IN NACL 100-0.45 UNIT/ML-% IJ SOLN
1550.0000 [IU]/h | INTRAMUSCULAR | Status: DC
  Administered 2013-05-19 – 2013-05-20 (×2): 1000 [IU]/h via INTRAVENOUS
  Administered 2013-05-21: 1250 [IU]/h via INTRAVENOUS
  Administered 2013-05-22 (×2): 1500 [IU]/h via INTRAVENOUS
  Administered 2013-05-23: 1650 [IU]/h via INTRAVENOUS
  Administered 2013-05-24 (×2): 1550 [IU]/h via INTRAVENOUS
  Filled 2013-05-19 (×13): qty 250

## 2013-05-19 MED ORDER — SODIUM CHLORIDE 0.9 % IJ SOLN: 3.0000 mL | INTRAMUSCULAR | Status: DC | PRN

## 2013-05-19 NOTE — Consult Note (Signed)
[facility] Renal Consultation Note  Indication for Consultation:  Management of ESRD/hemodialysis; anemia, hypertension/volume and secondary hyperparathyroidism  HPI: Marcus [name] is a 58 y.o. male with a history of diabetes, CVA, DVTs and PEs, and ESRD on dialysis on MWF at the [facility] who presented to [facility] yesterday with left arm swelling and pain and was found to have a left distal subclavian DVT per ultrasound.  He was subsequently transferred to [facility] for scheduled dialysis treatments.  Although his medication list includes Coumadin, he states that he has been unable to get it for about a month.  He required removal of his right thigh graft 12/31 secondary to infection and currently dialyzes with a right femoral catheter.  Dr. Myra Gianotti of [facility] saw him 1/26 for follow-up with plans to place eventually a new right thigh graft.  He has also been to the ED during the last week for altered mental status secondary to hypoglycemia.  His chest x-ray shows no pulmonary edema, and currently he is stable on Heparin drip and Coumadin per pharmacy.  Dialysis Orders:  MWF @ [facility] 4:15     58 kg     2K/2.5Ca       450?A1.5       Heparin 0     R femoral catheter   Hectorol 0         Epogen 0       Venofer 100 mg x 10 (2/2 - 23)  Past Medical History  Diagnosis Date  . Hypothyroidism   . Hyperlipidemia   . CVA (cerebrovascular accident)     hx CVA x 2  . GI bleed     2006, mallory-weiss tear  . Pulmonary edema     nov 2011, dry wt decreased  . Goiter     by CT Apr 2013  . Depression   . Cocaine abuse     past history  . Tobacco abuse     history of  . GERD (gastroesophageal reflux disease)   . Cataract   . Insomnia   . Chronic pain   . HTN (hypertension)     sees Dr. Ardelle Park, Rosalita Levan  . Neuromuscular disorder     diabetic neuropathy  . Occlusion of carotid stent     Per patients nursing aid  . ESRD (end stage renal disease) on dialysis     hd -  [facility] - [place]  . Pneumonia     hx of  . Diabetes mellitus     Sees Dr. Reather Littler [phone] -- fasting blood 70-110s  . DKA (diabetic ketoacidosis)     severe, Dec 2011  . History of blood transfusion   . On home oxygen therapy     2l nasal cannula at night  . Hemodialysis patient     M, W, F  . CHF (congestive heart failure)   . DVT (deep venous thrombosis)    Past Surgical History  Procedure Laterality Date  . Av fistula placement  03/25/2008    right forearm  . Av fistula placement  04/23/2012    Procedure: INSERTION OF ARTERIOVENOUS (AV) GORE-TEX GRAFT THIGH;  Surgeon: Sherren Kerns, MD;  Location: [facility] OR;  Service: Vascular;  Laterality: Left;  . Arteriovenous graft placement Left     thigh  . Carotid stent insertion Right   . Dyalisis catheter    . Eye surgery Bilateral     cataracts  . Thyroid surgery  01/28/2013    Dr  Gerkin  . Lymph node biopsy Right 01/28/2013    Procedure: LYMPH NODE BIOPSY;  Surgeon: Velora Heckler, MD;  Location: [facility] OR;  Service: General;  Laterality: Right;  . Mass biopsy N/A 01/28/2013    Procedure: NECK EXPLORATION;  Surgeon: Velora Heckler, MD;  Location: [facility] OR;  Service: General;  Laterality: N/A;  . Ligation arteriovenous gortex graft Right 02/27/2013    Procedure: LIGATION ARTERIOVENOUS GORTEX GRAFT;  Surgeon: Chuck Hint, MD;  Location: [facility] OR;  Service: Vascular;  Laterality: Right;  . Ligation arteriovenous gortex graft Left 03/03/2013    Procedure: LIGATION Left ARTERIOVENOUS GORTEX Thigh GRAFT;  Surgeon: Fransisco Hertz, MD;  Location: [facility] OR;  Service: Vascular;  Laterality: Left;  . Insertion of dialysis catheter Right 03/03/2013    Procedure: INSERTION OF DIALYSIS CATHETER;  Surgeon: Fransisco Hertz, MD;  Location: [facility] OR;  Service: Vascular;  Laterality: Right;  . Av fistula placement Left 03/14/2013    Procedure: INSERTION OF  ARTERIOVENOUS (AV) GORE-TEX GRAFT  LEFT THIGH;  Surgeon: Larina Earthly, MD;  Location: [facility] OR;  Service:  Vascular;  Laterality: Left;  . Avgg removal Left 04/16/2013    Procedure: REMOVAL OF ARTERIOVENOUS GORETEX GRAFT Left Thigh;  Surgeon: Nada Libman, MD;  Location: [facility] OR;  Service: Vascular;  Laterality: Left;  . Patch angioplasty Left 04/16/2013    Procedure: Patch Angioplasty of left femoral artery;  Surgeon: Nada Libman, MD;  Location: [facility] OR;  Service: Vascular;  Laterality: Left;   Family History  Problem Relation Age of Onset  . Diabetes Sister   . Diabetes Brother   . Hypertension Brother   . Hypertension Mother   . Heart attack Mother   . Hypertension Father    Social History  He has smoked since he was a teenager and continues to smoke about 3-4 cigarettes a day.  He denies any alcohol or illicit drug use.  He previously worked as a Arts administrator and currently lives with his sister.  Allergies  Allergen Reactions  . Penicillins Other (See Comments)    Unknown reaction   Prior to Admission medications   Medication Sig Start Date End Date Taking? Authorizing Provider  amLODipine (NORVASC) 10 MG tablet Take 1 tablet (10 mg total) by mouth at bedtime. 04/21/13   Esperanza Sheets, MD  aspirin EC 81 MG tablet Take 81 mg by mouth daily.    Historical Provider, MD  calcium acetate (PHOSLO) 667 MG capsule Take 1,334 mg by mouth 3 (three) times daily with meals.    Historical Provider, MD  cloNIDine (CATAPRES) 0.1 MG tablet Take 1 tablet (0.1 mg total) by mouth 3 (three) times daily. 04/21/13   Esperanza Sheets, MD  docusate sodium (COLACE) 100 MG capsule Take 100 mg by mouth 2 (two) times daily.     Historical Provider, MD  EASY TOUCH INSULIN SYRINGE 31G X 5/16" 0.5 ML MISC  12/19/12   Historical Provider, MD  FLUoxetine (PROZAC) 10 MG tablet Take 0.5 tablets (5 mg total) by mouth every morning. 04/21/13   Esperanza Sheets, MD  HYDROcodone-acetaminophen (NORCO/VICODIN) 5-325 MG per tablet Take 1-2 tablets by mouth every 4 (four) hours as needed for moderate pain. 04/21/13   Esperanza Sheets, MD  insulin aspart (NOVOLOG) 100 UNIT/ML injection Inject 2-3 Units into the skin 3 (three) times daily before meals. Inject 2 units with breakfast, 3 units before lunch, and 3 units before supper 04/21/13   Isaiah Serge  Buriev, MD  insulin glargine (LANTUS) 100 UNIT/ML injection Inject 0.05 mLs (5 Units total) into the skin at bedtime. Inject 13 units in the morning, and 9 units in the evening. 03/11/13   Zannie Cove, MD  levothyroxine (SYNTHROID, LEVOTHROID) 112 MCG tablet Take 1.5 tablets (168 mcg total) by mouth daily before breakfast. 04/21/13   Esperanza Sheets, MD  metoprolol (LOPRESSOR) 50 MG tablet Take 1.5 tablets (75 mg total) by mouth 2 (two) times daily. Hold morning dose on dialysis days 04/21/13   Esperanza Sheets, MD  omeprazole (PRILOSEC) 20 MG capsule Take 20 mg by mouth daily.    Historical Provider, MD  pravastatin (PRAVACHOL) 10 MG tablet Take 10 mg by mouth at bedtime.    Historical Provider, MD  PROAIR HFA 108 (90 BASE) MCG/ACT inhaler Inhale 2 puffs into the lungs every 4 (four) hours as needed for wheezing or shortness of breath.  12/08/12   Historical Provider, MD  promethazine (PHENERGAN) 25 MG tablet Take 25 mg by mouth every 6 (six) hours as needed. For nausea    Historical Provider, MD  sevelamer carbonate (RENVELA) 800 MG tablet Take 800 mg by mouth 3 (three) times daily with meals.  10/23/12   Historical Provider, MD  temazepam (RESTORIL) 15 MG capsule Take 15 mg by mouth 4 (four) times a week. Monday, Wednesday, Friday, saturday    Historical Provider, MD  warfarin (COUMADIN) 7.5 MG tablet Take 1 tablet (7.5 mg total) by mouth daily. To Start after AVF placed on Friday 03/14/13   Zannie Cove, MD   Labs:  Results for orders placed during the hospital encounter of 05/19/13 (from the past 48 hour(s))  GLUCOSE, CAPILLARY     Status: Abnormal   Collection Time    05/19/13 12:26 PM      Result Value Range   Glucose-Capillary 66 (*) 70 - 99 mg/dL   Constitutional:  negative for chills, fatigue, fevers and sweats Ears, nose, mouth, throat, and face: negative for earaches, hoarseness, nasal congestion and sore throat Respiratory: negative for cough, dyspnea on exertion, hemoptysis and sputum Cardiovascular: negative for chest pain, chest pressure/discomfort, dyspnea, orthopnea and palpitations Gastrointestinal: negative for abdominal pain, change in bowel habits, nausea and vomiting Genitourinary:negative, anuric Musculoskeletal:negative for arthralgias, back pain, myalgias and neck pain Neurological: positive for weakness; negative for dizziness, headaches and paresthesia  Physical Exam: Filed Vitals:   05/19/13 1346  BP: 218/113  Pulse: 79  Temp: 97.6 F (36.4 C)  Resp: 17     General appearance: alert, cooperative and no distress Head: Normocephalic, without obvious abnormality, atraumatic Neck: no adenopathy, no carotid bruit, no JVD, supple, symmetrical, trachea midline and soft fullness midline secondary to goiter Resp: clear to auscultation bilaterally Cardio: regular rate and rhythm, S1, S2 normal, no murmur, click, rub or gallop GI: soft, non-tender; bowel sounds normal; no masses,  no organomegaly Extremities: left arm with mild swelling, right upper thigh well healed s/p surgery, no lower extremity edema Neurologic: Grossly normal Dialysis Access: R femoral catheter   Assessment/Plan: 1. 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 2. Hypoglycemia - per primary. 3. ESRD - HD on MWF @ [facility].  HD pending. 4. Hypertension/volume - BP 213/113, on outpatient Amlodipine 10 mg qd, Metoprolol 75 mg bid except pre-HD; wt 62.6 kg with EDW 58, chest x-ray clear.  5. Anemia - Hgb  12.3, no outpatient Epogen, but starting Venofer series today. 6. Metabolic bone disease - Last Ca 9.1, P 4.6, iPTH 459; no Hectorol, phoslo 2 with  meals. 7. Nutrition - last Alb 3.6, renal diet & vitamin. 8. Hx infected access - R thigh AVG removed 12/31, now with R femoral catheter; seen by Dr. Myra GianottiBrabham 1/26, plans to move catheter to IJ and place new R thigh AVG. 9. Hx thyroid goiter - s/p neck exploration & lymph node biopsy (benign) per Dr. Gerrit FriendsGerkin 01/28/13, but may require thyroidectomy involving median sternotomy; on Synthroid.  Ahlani [name],Marcus 05/19/2013, 1:47 PM   Attending Nephrologist: Elvis CoilMartin Webb, MD

## 2013-05-19 NOTE — H&P (Addendum)
Marcus [name] [facility]                                                                                    Patient Demographics  Marcus [name], is a 58 y.o. male  MRN: [id number]   DOB - 04/29/1955  Admit Date - 05/19/2013  Outpatient Primary MD for the patient is [name], [name], MD   With History of -  Past Medical History  Diagnosis Date  . Hypothyroidism   . Hyperlipidemia   . CVA (cerebrovascular accident)     hx CVA x 2  . GI bleed     2006, mallory-weiss tear  . Pulmonary edema     nov 2011, dry wt decreased  . Goiter     by CT Apr 2013  . Depression   . Cocaine abuse     past history  . Tobacco abuse     history of  . GERD (gastroesophageal reflux disease)   . Cataract   . Insomnia   . Chronic pain   . HTN (hypertension)     sees Dr. Ardelle Park, Rosalita Levan  . Neuromuscular disorder     diabetic neuropathy  . Occlusion of carotid stent     Per patients nursing aid  . ESRD (end stage renal disease) on dialysis     hd - [facility] - [place]  . Pneumonia     hx of  . Diabetes mellitus     Sees Dr. Reather Littler [phone] -- fasting blood 70-110s  . DKA (diabetic ketoacidosis)     severe, Dec 2011  . History of blood transfusion   . On home oxygen therapy     2l nasal cannula at night  . Hemodialysis patient     M, W, F  . CHF (congestive heart failure)   . DVT (deep venous thrombosis)       Past Surgical History  Procedure Laterality Date  . Av fistula placement  03/25/2008    right forearm  . Av fistula placement  04/23/2012    Procedure: INSERTION OF ARTERIOVENOUS (AV) GORE-TEX GRAFT THIGH;  Surgeon: Sherren Kerns, MD;  Location: [facility] OR;  Service: Vascular;  Laterality: Left;  . Arteriovenous graft placement Left     thigh  . Carotid stent insertion Right   . Dyalisis catheter    . Eye surgery Bilateral     cataracts  . Thyroid surgery  01/28/2013    Dr Gerrit Friends  . Lymph node biopsy Right 01/28/2013    Procedure: LYMPH NODE BIOPSY;  Surgeon: Velora Heckler, MD;   Location: [facility] OR;  Service: General;  Laterality: Right;  . Mass biopsy N/A 01/28/2013    Procedure: NECK EXPLORATION;  Surgeon: Velora Heckler, MD;  Location: [facility] OR;  Service: General;  Laterality: N/A;  . Ligation arteriovenous gortex graft Right 02/27/2013    Procedure: LIGATION ARTERIOVENOUS GORTEX GRAFT;  Surgeon: Chuck Hint, MD;  Location: [facility] OR;  Service: Vascular;  Laterality: Right;  . Ligation arteriovenous gortex graft Left 03/03/2013    Procedure: LIGATION Left ARTERIOVENOUS GORTEX Thigh GRAFT;  Surgeon: Fransisco Hertz, MD;  Location: [facility] OR;  Service: Vascular;  Laterality:  Left;  . Insertion of dialysis catheter Right 03/03/2013    Procedure: INSERTION OF DIALYSIS CATHETER;  Surgeon: Fransisco HertzBrian L Chen, MD;  [facility];  Service: Vascular;  Laterality: Right;  . Av fistula placement Left 03/14/2013    Procedure: INSERTION OF  ARTERIOVENOUS (AV) GORE-TEX GRAFT  LEFT THIGH;  Surgeon: Larina Earthlyodd F Early, MD;  [facility];  Service: Vascular;  Laterality: Left;  . Avgg removal Left 04/16/2013    Procedure: REMOVAL OF ARTERIOVENOUS GORETEX GRAFT Left Thigh;  Surgeon: Nada LibmanVance W Brabham, MD;  [facility];  Service: Vascular;  Laterality: Left;  . Patch angioplasty Left 04/16/2013    Procedure: Patch Angioplasty of left femoral artery;  Surgeon: Nada LibmanVance W Brabham, MD;  [facility];  Service: Vascular;  Laterality: Left;    in for   Left arm pain with new distal subclavian DVT and hypoglycemia.    HPI  Marcus [name]  is a 58 y.o. male with a history listed above but significant for multiple DVTs, PEs, CVAs and ESRD who dialyzes normally on M/W/F.  [name] presented [facility] ER with left upper extremity pain. [facility] showed a left distal subclavian DVT.  [name] reports that he hasn't taken his coumadin for approximately 1 month because he could not get an order for it.  He has been compliant with his other medications including insulin.  He  reportedly has been to the ER 5x last week with altered mental status due to hypoglycemia.   Further [name] has recurrent problems with dialysis access.  He had an infected graft removed from his left groin on 12/31.  On exam this area is still tender to palpation and mildly indurated.  He is currently using a right femoral catheter for HD access.  Review of Systems    In addition to the HPI above No Fever-chills, No Headache, No changes with Vision or hearing, No problems swallowing food or Liquids, No Chest pain, Cough or Shortness of Breath, No Abdominal pain, No Nausea or Vomiting, Bowel movements are regular, No Blood in stool or Urine, No dysuria, No new skin rashes or bruises, No new joints pains-aches,  No new weakness, tingling, numbness in any extremity, No recent weight gain or loss, No polyuria, polydypsia or polyphagia, No significant Mental Stressors.  A full 10 point Review of Systems was done, except as stated above, all other Review of Systems were negative.   Social History History  Substance Use Topics  . Smoking status: Current Every Day Smoker -- 0.25 packs/day    Types: Cigarettes  . Smokeless tobacco: Never Used     Comment: pt states that he has been thinking about giving it up  . Alcohol Use: No     Family History Family History  Problem Relation Age of Onset  . Diabetes Sister   . Diabetes Brother   . Hypertension Brother   . Hypertension Mother   . Heart attack Mother   . Hypertension Father      Prior to Admission medications   Medication Sig Start Date End Date Taking? Authorizing Provider  amLODipine (NORVASC) 10 MG tablet Take 1 tablet (10 mg total) by mouth at bedtime. 04/21/13   Esperanza SheetsUlugbek N Buriev, MD  aspirin EC 81 MG tablet Take 81 mg by mouth daily.    Historical Provider, MD  calcium acetate (PHOSLO) 667 MG capsule Take 1,334 mg by mouth 3 (three) times daily with meals.    Historical Provider,  MD  cloNIDine (CATAPRES) 0.1 MG  tablet Take 1 tablet (0.1 mg total) by mouth 3 (three) times daily. 04/21/13   Esperanza Sheets, MD  docusate sodium (COLACE) 100 MG capsule Take 100 mg by mouth 2 (two) times daily.     Historical Provider, MD  EASY TOUCH INSULIN SYRINGE 31G X 5/16" 0.5 ML MISC  12/19/12   Historical Provider, MD  FLUoxetine (PROZAC) 10 MG tablet Take 0.5 tablets (5 mg total) by mouth every morning. 04/21/13   Esperanza Sheets, MD  HYDROcodone-acetaminophen (NORCO/VICODIN) 5-325 MG per tablet Take 1-2 tablets by mouth every 4 (four) hours as needed for moderate pain. 04/21/13   Esperanza Sheets, MD  insulin aspart (NOVOLOG) 100 UNIT/ML injection Inject 2-3 Units into the skin 3 (three) times daily before meals. Inject 2 units with breakfast, 3 units before lunch, and 3 units before supper 04/21/13   Esperanza Sheets, MD  insulin glargine (LANTUS) 100 UNIT/ML injection Inject 0.05 mLs (5 Units total) into the skin at bedtime. Inject 13 units in the morning, and 9 units in the evening. 03/11/13   Zannie Cove, MD  levothyroxine (SYNTHROID, LEVOTHROID) 112 MCG tablet Take 1.5 tablets (168 mcg total) by mouth daily before breakfast. 04/21/13   Esperanza Sheets, MD  metoprolol (LOPRESSOR) 50 MG tablet Take 1.5 tablets (75 mg total) by mouth 2 (two) times daily. Hold morning dose on dialysis days 04/21/13   Esperanza Sheets, MD  omeprazole (PRILOSEC) 20 MG capsule Take 20 mg by mouth daily.    Historical Provider, MD  pravastatin (PRAVACHOL) 10 MG tablet Take 10 mg by mouth at bedtime.    Historical Provider, MD  PROAIR HFA 108 (90 BASE) MCG/ACT inhaler Inhale 2 puffs into the lungs every 4 (four) hours as needed for wheezing or shortness of breath.  12/08/12   Historical Provider, MD  promethazine (PHENERGAN) 25 MG tablet Take 25 mg by mouth every 6 (six) hours as needed. For nausea    Historical Provider, MD  sevelamer carbonate (RENVELA) 800 MG tablet Take 800 mg by mouth 3 (three) times daily with meals.  10/23/12   Historical  Provider, MD  temazepam (RESTORIL) 15 MG capsule Take 15 mg by mouth 4 (four) times a week. Monday, Wednesday, Friday, saturday    Historical Provider, MD  warfarin (COUMADIN) 7.5 MG tablet Take 1 tablet (7.5 mg total) by mouth daily. To Start after AVF placed on Friday 03/14/13   Zannie Cove, MD    Allergies  Allergen Reactions  . Penicillins Other (See Comments)    Unknown reaction    Physical Exam  Vitals  Blood pressure 218/113, pulse 79, temperature 97.6 F (36.4 C), resp. rate 17, height 6\' 2"  (1.88 m), weight 62.6 kg (138 lb 0.1 oz), SpO2 98.00%.   General:  lying in bed in NAD, demeanor is mildly lethargic and his speech is slightly slurred.  Psych:   Not Suicidal or Homicidal, Awake Alert, Oriented X 3.  Neuro:   No F.N deficits, ALL C.Nerves Intact, Strength 5/5 all 4 extremities, Sensation intact all 4 extremities, Plantars down going.  ENT:  Ears and Eyes appear Normal, mucous membranes appear dry.  Neck:  supple, but there is a soft fullness in the anterior cricoid area.  No LAD  Respiratory:  Chest there is a soft mass in the left upper chest approx 2" in size.  Symmetrical Chest wall movement, Good air movement bilaterally.  Cardiac:  RRR, No Gallops, Rubs or Murmurs,  No Parasternal Heave.  Abdomen:  Positive Bowel Sounds, Abdomen Soft, Non tender, No organomegaly appreciated.  Left groin has an area in induration and tenderness where previous graft was removed.  Skin:  No Cyanosis,  No Skin Rash or Bruise. Dry skin  Extremities:  Slight swelling thru out left forearm and hand. Decreased ROM in LUE.  Lower extremities are without edema.   Data Review CBC, Renal Panel, and TSH are pending.  Imaging results:   Dg Chest Port 1 View  05/19/2013   CLINICAL DATA:  Shortness of breath.  DVT.  EXAM: PORTABLE CHEST - 1 VIEW  COMPARISON:  CT ANGIO CHEST dated 05/19/2013; DG CHEST PORTABLE dated 05/15/2013  FINDINGS: 1304 hr. The heart size and mediastinal contours  are stable. There is superior mediastinal widening corresponding with thyromegaly on CT. Right brachiocephalic venous stent is noted, occluded on CT. Dialysis catheters are present within the right atrium, inserted inferiorly. The lungs are clear. There is stable blunting of the left costophrenic angle. No pleural effusion or pneumothorax is identified.  IMPRESSION: Stable postoperative findings with chronic blunting of the left costophrenic angle. No acute cardiopulmonary process.   Electronically Signed   By: Roxy Horseman M.D.   On: 05/19/2013 13:27    EKG:  Pending.   Assessment & Plan  Principal Problem:   Arm DVT (deep venous thromboembolism), acute Active Problems:   Diabetes mellitus   Hypoglycemia due to insulin   Acute DVT (deep venous thrombosis)  Acute Left Upper Extremity DVT Heparin Drip per pharmacy Coumadin per pharmacy Patient does not appear in any respiratory distress.   CT Angio not ordered as I do not believe it would change management.  Recurrent Hypoglycemia in Type 1 DM Allow permissive hyperglycemia to a certain degree, stop Lantus for now Check A1C CBGs q 4 hours for now.  ESRD Appreciate renal consultation Scheduled dialysis is M/W/F in [place]. Does not appear to be in vol. Overload.  Hyperkalemia - Will not treat with Kayexalate, spoken with Dr. Hyman Hopes who will arrange for dialysis   uncontrolled hypertension  - Resume home antihypertensives-amlodipine, clonidine and metoprolol  - Suspect blood pressure will be better post dialysis today   Recent history of infected graft in left groin  Does not appear acutely infected, no drainage. Will ask [facility] for their evaluation and recommendations regarding his previous left groin infection as well as current HD access.  Hypothyroidism Recent history of elevated TSH. Synthroid dose was adjusted 12/31. Will recheck TSH.  Soft tissue fullness in his neck Uncertain etiology Patient reports recent  falls. Does not appear to be symptomatic Will discuss with attending & consider ultrasound.  Further recommendations will be forth coming after admission labs have resulted.  DVT treatment:  Heparin infusion per pharmacy.  AM Labs Ordered, also please review Full Orders  Family Communication:   None at bedside. Patient appeared alert and orientated.  Code Status: full   Likely DC to  Home when appropriate. Condition:  Stable.  Time spent in minutes : 60    York, Tora Kindred PA-C on 05/19/2013 at 2:07 PM  Between 7am to 7pm - Pager - [phone]  After 7pm go to [email or web address] - password [email or web address]  And look for the night coverage person covering me after hours  [facility] Office  [phone]  Attending Patient seen and examined, agree with the above assessment and plan. Patient admitted with left upper extremity DVT, he has a history of recurrent venous thromboembolism and is supposed  to be on Coumadin. He claims that he has not taken Coumadin for the past one month. He apparently has had recurrent ED visits in [facility] for recurrent hypoglycemia. He also has end-stage disease, and has had access issues, and was recently seen by [facility]. Current access is a dialysis catheter in the right femoral groin. For now, start heparin infusion and Coumadin per pharmacy Regarding the current hypoglycemia, check A1c, a some permissive hyperglycemia- stop Lantus, if SSI is persistently elevated then start on sliding scale. Have personally spoken with Dr. Hyman Hopes regarding the hyperkalemia, Dr. Hyman Hopes is planning for dialysis soon, therefore hold of on Kayexalate/Insulin/D50  Windell Norfolk MD

## 2013-05-19 NOTE — Progress Notes (Signed)
CRITICAL VALUE ALERT  Critical value received: Potassium of 6.5  Date of notification:  05-19-2013  Time of notification:  14:30  Critical value read back:yes  Nurse who received alert:  Meridee ScoreAndy Veneta Sliter, RN  MD notified (1st page):  Ghimire   Time of first page:  14:40

## 2013-05-19 NOTE — Progress Notes (Signed)
ANTICOAGULATION CONSULT NOTE - Initial Consult  Pharmacy Consult for Heparin + Coumadin  Indication: New LUE DVT  Allergies  Allergen Reactions  . Penicillins Other (See Comments)    Unknown reaction    Patient Measurements: Height: 6\' 2"  (188 cm) Weight: 138 lb 0.1 oz (62.6 kg) IBW/kg (Calculated) : 82.2 Heparin Dosing Weight: n/a   Vital Signs: Temp: 97.6 F (36.4 C) (02/02 1346) BP: 218/113 mmHg (02/02 1346) Pulse Rate: 79 (02/02 1346)  Labs:  Recent Labs  05/19/13 1335  HGB 12.3*  HCT 37.2*  PLT 196  LABPROT 13.3  INR 1.03    Estimated Creatinine Clearance: 8.5 ml/min (by C-G formula based on Cr of 8.48).   Medical History: Past Medical History  Diagnosis Date  . Hypothyroidism   . Hyperlipidemia   . CVA (cerebrovascular accident)     hx CVA x 2  . GI bleed     2006, mallory-weiss tear  . Pulmonary edema     nov 2011, dry wt decreased  . Goiter     by CT Apr 2013  . Depression   . Cocaine abuse     past history  . Tobacco abuse     history of  . GERD (gastroesophageal reflux disease)   . Cataract   . Insomnia   . Chronic pain   . HTN (hypertension)     se[place] Dr. Ardelle [place]Haque, [place]alita [place]  . Neuromuscular disorder     diabetic neuropathy  . Occlusion of carotid stent     Per patients nursing aid  . ESRD (end stage renal disease) on dialysis     hd - [facility] - [place]  . Pneumonia     hx of  . Diabetes mellitus     Sees Dr. Reather LittlerAjay Kumar [phone] -- fasting blood 70-110s  . DKA (diabetic ketoacidosis)     severe, Dec 2011  . History of blood transfusion   . On home oxygen therapy     2l nasal cannula at night  . Hemodialysis patient     M, W, F  . CHF (congestive heart failure)   . DVT (deep venous thrombosis)     Medications:  Prescriptions prior to admission  Medication Sig Dispense Refill  . amLODipine (NORVASC) 10 MG tablet Take 1 tablet (10 mg total) by mouth at bedtime.  30 tablet  2  . aspirin EC 81 MG tablet Take 81 mg by mouth  daily.      . calcium acetate (PHOSLO) 667 MG capsule Take 1,334 mg by mouth 3 (three) times daily with meals.      . cloNIDine (CATAPRES) 0.1 MG tablet Take 1 tablet (0.1 mg total) by mouth 3 (three) times daily.  60 tablet  11  . docusate sodium (COLACE) 100 MG capsule Take 100 mg by mouth 2 (two) times daily.       Marland Kitchen. EASY TOUCH INSULIN SYRINGE 31G X 5/16" 0.5 ML MISC       . FLUoxetine (PROZAC) 10 MG tablet Take 0.5 tablets (5 mg total) by mouth every morning.  30 tablet  3  . HYDROcodone-acetaminophen (NORCO/VICODIN) 5-325 MG per tablet Take 1-2 tablets by mouth every 4 (four) hours as needed for moderate pain.  30 tablet  0  . insulin aspart (NOVOLOG) 100 UNIT/ML injection Inject 2-3 Units into the skin 3 (three) times daily before meals. Inject 2 units with breakfast, 3 units before lunch, and 3 units before supper  1 vial  12  . insulin glargine (  LANTUS) 100 UNIT/ML injection Inject 0.05 mLs (5 Units total) into the skin at bedtime. Inject 13 units in the morning, and 9 units in the evening.  10 mL  12  . levothyroxine (SYNTHROID, LEVOTHROID) 112 MCG tablet Take 1.5 tablets (168 mcg total) by mouth daily before breakfast.  30 tablet  1  . metoprolol (LOPRESSOR) 50 MG tablet Take 1.5 tablets (75 mg total) by mouth 2 (two) times daily. Hold morning dose on dialysis days  60 tablet  2  . omeprazole (PRILOSEC) 20 MG capsule Take 20 mg by mouth daily.      . pravastatin (PRAVACHOL) 10 MG tablet Take 10 mg by mouth at bedtime.      Marland Kitchen PROAIR HFA 108 (90 BASE) MCG/ACT inhaler Inhale 2 puffs into the lungs every 4 (four) hours as needed for wheezing or shortness of breath.       . promethazine (PHENERGAN) 25 MG tablet Take 25 mg by mouth every 6 (six) hours as needed. For nausea      . sevelamer carbonate (RENVELA) 800 MG tablet Take 800 mg by mouth 3 (three) times daily with meals.       . temazepam (RESTORIL) 15 MG capsule Take 15 mg by mouth 4 (four) times a week. Monday, Wednesday, Friday, saturday       . warfarin (COUMADIN) 7.5 MG tablet Take 1 tablet (7.5 mg total) by mouth daily. To Start after AVF placed on Friday  30 tablet  0    Assessment: 45 YOM with h/o of multiple DVTs, PEs presented at [facility] ER with left upper extremity pain. U/S at [facility] showed left distal subclavian DVT. Pt reports he has not taken his coumadin in approximately 1 month b/c he could not get an order for it. He is compliant with all of his other medications including coumadin. Pharmacy to start Coumadin with heparin bridge for DVT till INR > 2. Patient currently reports no s/s of bleeding. H/H 12.3/37.2. Plt 196. INR 1.03.  Goal of Therapy:  Heparin level 0.3-0.7 units/ml Monitor platelets by anticoagulation protocol: Yes   Plan:  Give 4000 units bolus x 1 Start heparin infusion at 1000 units/hr Check anti-Xa level in 8 hours and daily while on heparin Continue to monitor H&H and platelets  Vinnie Level, PharmD.  Clinical Pharmacist Pager [phone]

## 2013-05-19 NOTE — Consult Note (Signed)
[facility] [place] CONSULT NOTE   MRN : [id number]  Reason for Consult: Left groin incision check post-op removal of left thigh graft. Referring Physician: Dr. Ramiro Harvest   History of Present Illness: [name] Who had an infected left thigh graft removed by Dr. Myra Gianotti 04/16/2013.  He currently uses a right thigh diatek for dialysis.  We were asked to consult for left groin tenderness and incision check.   He was admitted secondary to [facility] left distal subclavian DVT.       Current Facility-Administered Medications  Medication Dose Route Frequency Provider Last Rate Last Dose  . dextrose (GLUTOSE) 40 % oral gel 37.5 g  1 Tube Oral PRN Rodolph Bong, MD      . dextrose 50 % solution 25 mL  25 mL Intravenous Once PRN Rodolph Bong, MD      . glucose-Vitamin C 4-0.006 GM per chewable tablet 4 tablet  4 tablet Oral PRN Rodolph Bong, MD   4 tablet at 05/19/13 1249  . hydrALAZINE (APRESOLINE) injection 10 mg  10 mg Intravenous Q6H PRN Stephani Police, PA-C      . insulin aspart (novoLOG) injection 0-9 Units  0-9 Units Subcutaneous TID WC Marianne L York, PA-C        Pt meds include: Statin :Yes Betablocker: Yes ASA: Yes Other anticoagulants/antiplatelets: He was on coumadin , but not for the last month.  Past Medical History  Diagnosis Date  . Hypothyroidism   . Hyperlipidemia   . CVA (cerebrovascular accident)     hx CVA x 2  . GI bleed     2006, mallory-weiss tear  . Pulmonary edema     nov 2011, dry wt decreased  . Goiter     by CT Apr 2013  . Depression   . Cocaine abuse     past history  . Tobacco abuse     history of  . GERD (gastroesophageal reflux disease)   . Cataract   . Insomnia   . Chronic pain   . HTN (hypertension)     sees Dr. Ardelle Park, Rosalita Levan  . Neuromuscular disorder     diabetic neuropathy  . Occlusion of carotid stent     Per patients nursing aid  . ESRD (end stage renal disease) on dialysis     hd -  [facility] - [place]  . Pneumonia     hx of  . Diabetes mellitus     Sees Dr. Reather Littler [phone] -- fasting blood 70-110s  . DKA (diabetic ketoacidosis)     severe, Dec 2011  . History of blood transfusion   . On home oxygen therapy     2l nasal cannula at night  . Hemodialysis patient     M, W, F  . CHF (congestive heart failure)   . DVT (deep venous thrombosis)     Past Surgical History  Procedure Laterality Date  . Av fistula placement  03/25/2008    right forearm  . Av fistula placement  04/23/2012    Procedure: INSERTION OF ARTERIOVENOUS (AV) GORE-TEX GRAFT THIGH;  Surgeon: Sherren Kerns, MD;  Location: [facility] OR;  Service: Vascular;  Laterality: Left;  . Arteriovenous graft placement Left     thigh  . Carotid stent insertion Right   . Dyalisis catheter    . Eye surgery Bilateral     cataracts  . Thyroid surgery  01/28/2013    Dr Gerrit Friends  . Lymph node  biopsy Right 01/28/2013    Procedure: LYMPH NODE BIOPSY;  Surgeon: Velora Heckler, MD;  Location: [facility] OR;  Service: General;  Laterality: Right;  . Mass biopsy N/A 01/28/2013    Procedure: NECK EXPLORATION;  Surgeon: Velora Heckler, MD;  Location: [facility] OR;  Service: General;  Laterality: N/A;  . Ligation arteriovenous gortex graft Right 02/27/2013    Procedure: LIGATION ARTERIOVENOUS GORTEX GRAFT;  Surgeon: Chuck Hint, MD;  Location: [facility] OR;  Service: Vascular;  Laterality: Right;  . Ligation arteriovenous gortex graft Left 03/03/2013    Procedure: LIGATION Left ARTERIOVENOUS GORTEX Thigh GRAFT;  Surgeon: Fransisco Hertz, MD;  Location: [facility] OR;  Service: Vascular;  Laterality: Left;  . Insertion of dialysis catheter Right 03/03/2013    Procedure: INSERTION OF DIALYSIS CATHETER;  Surgeon: Fransisco Hertz, MD;  Location: [facility] OR;  Service: Vascular;  Laterality: Right;  . Av fistula placement Left 03/14/2013    Procedure: INSERTION OF  ARTERIOVENOUS (AV) GORE-TEX GRAFT  LEFT THIGH;  Surgeon: Larina Earthly, MD;  Location: [facility] OR;   Service: Vascular;  Laterality: Left;  . Avgg removal Left 04/16/2013    Procedure: REMOVAL OF ARTERIOVENOUS GORETEX GRAFT Left Thigh;  Surgeon: Nada Libman, MD;  Location: [facility] OR;  Service: Vascular;  Laterality: Left;  . Patch angioplasty Left 04/16/2013    Procedure: Patch Angioplasty of left femoral artery;  Surgeon: Nada Libman, MD;  Location: [facility] OR;  Service: Vascular;  Laterality: Left;    Social History History  Substance Use Topics  . Smoking status: Current Every Day Smoker -- 0.25 packs/day    Types: Cigarettes  . Smokeless tobacco: Never Used     Comment: pt states that he has been thinking about giving it up  . Alcohol Use: No    Family History Family History  Problem Relation Age of Onset  . Diabetes Sister   . Diabetes Brother   . Hypertension Brother   . Hypertension Mother   . Heart attack Mother   . Hypertension Father     Allergies  Allergen Reactions  . Penicillins Other (See Comments)    Unknown reaction     REVIEW OF SYSTEMS  General: [ ]  Weight loss, [ ]  Fever, [ ]  chills Neurologic: [ ]  Dizziness, [ ]  Blackouts, [ ]  Seizure [ ]  Stroke, [ ]  "Mini stroke", [ ]  Slurred speech, [ ]  Temporary blindness; [ ]  weakness in arms or legs, [ ]  Hoarseness [ ]  Dysphagia Cardiac: [ ]  Chest pain/pressure, [ ]  Shortness of breath at rest [ ]  Shortness of breath with exertion, [ ]  Atrial fibrillation or irregular heartbeat  Vascular: [ ]  Pain in legs with walking, [ ]  Pain in legs at rest, [ ]  Pain in legs at night,  [ ]  Non-healing ulcer, [x ] Blood clot in vein/DVT,   Pulmonary: [ ]  Home oxygen, [ ]  Productive cough, [ ]  Coughing up blood, [ ]  Asthma,  [ ]  Wheezing [ ]  COPD Musculoskeletal:  [ ]  Arthritis, [ ]  Low back pain, [ ]  Joint pain Hematologic: [ ]  Easy Bruising, [ ]  Anemia; [ ]  Hepatitis Gastrointestinal: [ ]  Blood in stool, [ ]  Gastroesophageal Reflux/heartburn, Urinary: [ ]  chronic Kidney disease, [ ]  on HD - [x ] MWF or [ ]  TTHS, [ ]  Burning  with urination, [ ]  Difficulty urinating Skin: [ ]  Rashes, [ ]  Wounds Psychological: [ ]  Anxiety, [ ]  Depression  Physical Examination Filed Vitals:   05/19/13 1222 05/19/13  1224 05/19/13 1346  BP: 206/103  218/113  Pulse: 81  79  Temp: 97.8 F (36.6 C)  97.6 F (36.4 C)  Resp: 17  17  Height:  6\' 2"  (1.88 m)   Weight:  138 lb 0.1 oz (62.6 kg)   SpO2: 98%  98%   Body mass index is 17.71 kg/(m^2).  General:  WDWN in NAD HENT: WNL Eyes: Pupils equal Pulmonary: normal non-labored breathing , without Rales, rhonchi,  wheezing Cardiac: RRR, without  Murmurs, rubs or gallops; No carotid bruits Abdomen: soft, NT, no masses Skin: no rashes, ulcers noted;  no Gangrene , no cellulitis; no open wounds;   Vascular Exam/Pulses:Palpable left femoral pulses, palpable right femoral artery.  Left groin incision is healing well without drainage or erythema.   Musculoskeletal: no muscle wasting or atrophy; no edema  Neurologic: A&O X 3; Appropriate Affect ;  SENSATION: normal; MOTOR FUNCTION: 5/5 Symmetric grossly Speech is fluent/normal   Significant Diagnostic Studies: CBC Lab Results  Component Value Date   WBC 6.9 04/21/2013   HGB 9.8* 04/21/2013   HCT 30.4* 04/21/2013   MCV 93.0 04/21/2013   PLT 401* 04/21/2013    BMET    Component Value Date/Time   NA 129* 04/21/2013 0920   K 4.9 04/21/2013 0920   CL 89* 04/21/2013 0920   CO2 23 04/21/2013 0920   GLUCOSE 249* 04/21/2013 0920   BUN 32* 04/21/2013 0920   CREATININE 8.48* 04/21/2013 0920   CALCIUM 8.5 04/21/2013 0920   CALCIUM 8.8 05/20/2008 0705   GFRNONAA 6* 04/21/2013 0920   GFRAA 7* 04/21/2013 0920   Estimated Creatinine Clearance: 8.5 ml/min (by C-G formula based on Cr of 8.48).  COAG Lab Results  Component Value Date   INR 2.34* 04/21/2013   INR 1.94* 04/20/2013   INR 1.61* 04/19/2013     Non-Invasive Vascular Imaging:   ASSESSMENT/PLAN:  Dr. Myra GianottiBrabham has planned future surgery for new access on the right thigh as a last resort and while  this heals he will place a diatek on the left thigh.  This is planned for 06/13/2013.  Left groin incision is healing nicely    Marcus GallantCOLLINS, Marcus [name] 05/19/2013 1:55 PM  I have examined the patient, reviewed and agree with above. No issues with the left groin. Patient is noncompliant with his anticoagulation. Plan is for new access placement with Dr. Myra GianottiBrabham at the end of the month.  Roc Streett, MD 05/19/2013 3:16 PM

## 2013-05-19 NOTE — Progress Notes (Signed)
CRITICAL VALUE ALERT  Critical value received:  k 7.7  Date of notification:05/19/2013   Time of notification:  2045   Critical value read back: yes   Nurse who received alert:Yesly Gerety  MD notified (1st page):  2045  Time of first page: 2045  MD notified (2nd page):  Time of second page:  Responding MD:  2050  Time MD responded:  2050

## 2013-05-19 NOTE — Consult Note (Signed)
I have seen and examined this patient and agree with the plan of care Will plan dialysis K 6.5 Ransome Helwig W 05/19/2013, 3:19 PM

## 2013-05-19 NOTE — Progress Notes (Signed)
Hypoglycemic Event  CBG: 66  Treatment: 3 glucose tabs  Symptoms: Hungry  Follow-up CBG: Time: 13:33 CBG Result:115  Possible Reasons for Event: Inadequate meal intake  Comments/MD notified:    Geovanni Rahming R  Remember to initiate Hypoglycemia Order Set & complete

## 2013-05-19 NOTE — Progress Notes (Signed)
Inpatient Diabetes Program Recommendations  AACE/ADA: New Consensus Statement on Inpatient Glycemic Control (2013)  Target Ranges:  Prepandial:   less than 140 mg/dL      Peak postprandial:   less than 180 mg/dL (1-2 hours)      Critically ill patients:  140 - 180 mg/dL    **Mild hypoglycemia on admission.  Diabetes history: Type 1 DM per records. Outpatient Diabetes medications: Lantus 13 units in the AM and 9 units in the PM           Novolog 2 units with breakfast/ 3 units with lunch/ 3 units with supper Current orders for Inpatient glycemic control: None    **MD- Please initiate Novolog Sensitive SSI tid.  May also want to initiate 1/2 home doses of Lantus.  Would start Lantus 6 units in the AM and 5 units at HS.  Noted patient was mildly hypoglycemic on admission, however, concern that if patient does not get any basal insulin that his CBGs will rise rapidly.   Will follow. Ambrose FinlandJeannine Johnston Mariadelaluz Guggenheim RN, MSN, CDE Diabetes Coordinator Inpatient Diabetes Program Team Pager: [phone] (8a-10p)

## 2013-05-20 DIAGNOSIS — E871 Hypo-osmolality and hyponatremia: Secondary | ICD-10-CM

## 2013-05-20 DIAGNOSIS — E119 Type 2 diabetes mellitus without complications: Secondary | ICD-10-CM

## 2013-05-20 LAB — RENAL FUNCTION PANEL
Albumin: 3.1 g/dL — ABNORMAL LOW (ref 3.5–5.2)
Albumin: 2.9 g/dL — ABNORMAL LOW (ref 3.5–5.2)
BUN: 48 mg/dL — AB (ref 6–23)
BUN: 25 mg/dL — ABNORMAL HIGH (ref 6–23)
CHLORIDE: 90 meq/L — AB (ref 96–112)
CO2: 15 mEq/L — ABNORMAL LOW (ref 19–32)
CO2: 23 meq/L (ref 19–32)
CREATININE: 6.82 mg/dL — AB (ref 0.50–1.35)
CREATININE: 4.31 mg/dL — AB (ref 0.50–1.35)
Calcium: 8.5 mg/dL (ref 8.4–10.5)
Calcium: 8.5 mg/dL (ref 8.4–10.5)
Chloride: 83 mEq/L — ABNORMAL LOW (ref 96–112)
GFR calc Af Amer: 9 mL/min — ABNORMAL LOW (ref >90)
GFR calc Af Amer: 16 mL/min — ABNORMAL LOW (ref >90)
GFR calc non Af Amer: 8 mL/min — ABNORMAL LOW (ref >90)
GFR, EST NON AFRICAN AMERICAN: 14 mL/min — AB (ref >90)
GLUCOSE: 571 mg/dL — AB (ref 70–99)
Glucose, Bld: 222 mg/dL — ABNORMAL HIGH (ref 70–99)
PHOSPHORUS: 6.1 mg/dL — AB (ref 2.3–4.6)
Phosphorus: 4.6 mg/dL (ref 2.3–4.6)
Potassium: 6.4 mEq/L — ABNORMAL HIGH (ref 3.7–5.3)
Potassium: 4.3 mEq/L (ref 3.7–5.3)
Sodium: 123 mEq/L — ABNORMAL LOW (ref 137–147)
Sodium: 129 mEq/L — ABNORMAL LOW (ref 137–147)

## 2013-05-20 LAB — GLUCOSE, CAPILLARY
GLUCOSE-CAPILLARY: 133 mg/dL — AB (ref 70–99)
GLUCOSE-CAPILLARY: 263 mg/dL — AB (ref 70–99)
Glucose-Capillary: 223 mg/dL — ABNORMAL HIGH (ref 70–99)
Glucose-Capillary: 324 mg/dL — ABNORMAL HIGH (ref 70–99)
Glucose-Capillary: 34 mg/dL — CL (ref 70–99)
Glucose-Capillary: 447 mg/dL — ABNORMAL HIGH (ref 70–99)
Glucose-Capillary: 512 mg/dL — ABNORMAL HIGH (ref 70–99)
Glucose-Capillary: 549 mg/dL — ABNORMAL HIGH (ref 70–99)

## 2013-05-20 LAB — PROTIME-INR
INR: 1.08 (ref 0.00–1.49)
Prothrombin Time: 13.8 seconds (ref 11.6–15.2)

## 2013-05-20 LAB — HEPATITIS B SURFACE ANTIGEN: HEP B S AG: NEGATIVE

## 2013-05-20 LAB — HEPARIN LEVEL (UNFRACTIONATED)
Heparin Unfractionated: 0.47 IU/mL (ref 0.30–0.70)
Heparin Unfractionated: 0.28 IU/mL — ABNORMAL LOW (ref 0.30–0.70)

## 2013-05-20 LAB — HEPATITIS B SURFACE ANTIBODY,QUALITATIVE

## 2013-05-20 MED ORDER — INSULIN ASPART 100 UNIT/ML ~~LOC~~ SOLN: 0.0000 [IU] | Freq: Every day | SUBCUTANEOUS | Status: DC

## 2013-05-20 MED ORDER — ALTEPLASE 2 MG IJ SOLR
8.0000 mg | INTRAMUSCULAR | Status: AC
Start: 2013-05-20 — End: 2013-05-20
  Administered 2013-05-20: 7 mg
  Filled 2013-05-20: qty 8

## 2013-05-20 MED ORDER — WARFARIN SODIUM 10 MG PO TABS
10.0000 mg | ORAL_TABLET | Freq: Once | ORAL | Status: AC
  Administered 2013-05-20: 10 mg via ORAL
  Filled 2013-05-20: qty 1

## 2013-05-20 MED ORDER — DEXTROSE 50 % IV SOLN
INTRAVENOUS | Status: AC
  Administered 2013-05-20: 50 mL via INTRAVENOUS
  Filled 2013-05-20: qty 50

## 2013-05-20 MED ORDER — NEPRO/CARBSTEADY PO LIQD
237.0000 mL | ORAL | Status: DC | PRN
  Filled 2013-05-20: qty 237

## 2013-05-20 MED ORDER — INSULIN GLARGINE 100 UNIT/ML ~~LOC~~ SOLN
7.0000 [IU] | Freq: Every day | SUBCUTANEOUS | Status: DC
  Administered 2013-05-20 – 2013-05-23 (×4): 7 [IU] via SUBCUTANEOUS
  Filled 2013-05-20 (×5): qty 0.07

## 2013-05-20 MED ORDER — HEPARIN SODIUM (PORCINE) 1000 UNIT/ML DIALYSIS: 1000.0000 [IU] | INTRAMUSCULAR | Status: DC | PRN

## 2013-05-20 MED ORDER — INSULIN GLARGINE 100 UNIT/ML ~~LOC~~ SOLN: 5.0000 [IU] | Freq: Every day | SUBCUTANEOUS | Status: DC

## 2013-05-20 MED ORDER — INSULIN GLARGINE 100 UNIT/ML ~~LOC~~ SOLN
5.0000 [IU] | Freq: Every day | SUBCUTANEOUS | Status: DC
  Administered 2013-05-20 – 2013-05-22 (×3): 5 [IU] via SUBCUTANEOUS
  Filled 2013-05-20 (×4): qty 0.05

## 2013-05-20 MED ORDER — INSULIN ASPART 100 UNIT/ML ~~LOC~~ SOLN: 0.0000 [IU] | Freq: Three times a day (TID) | SUBCUTANEOUS | Status: DC

## 2013-05-20 MED ORDER — NEPRO/CARBSTEADY PO LIQD
237.0000 mL | Freq: Two times a day (BID) | ORAL | Status: DC
  Administered 2013-05-20 – 2013-05-28 (×14): 237 mL via ORAL

## 2013-05-20 MED ORDER — SODIUM CHLORIDE 0.9 % IV SOLN: 100.0000 mL | INTRAVENOUS | Status: DC | PRN

## 2013-05-20 MED ORDER — INSULIN GLARGINE 100 UNIT/ML ~~LOC~~ SOLN
10.0000 [IU] | Freq: Every day | SUBCUTANEOUS | Status: DC
  Filled 2013-05-20: qty 0.1

## 2013-05-20 MED ORDER — INSULIN ASPART 100 UNIT/ML ~~LOC~~ SOLN
0.0000 [IU] | Freq: Three times a day (TID) | SUBCUTANEOUS | Status: DC
  Administered 2013-05-21: 2 [IU] via SUBCUTANEOUS
  Administered 2013-05-21: 7 [IU] via SUBCUTANEOUS
  Administered 2013-05-22: 2 [IU] via SUBCUTANEOUS
  Administered 2013-05-22: 9 [IU] via SUBCUTANEOUS
  Administered 2013-05-23: 7 [IU] via SUBCUTANEOUS
  Administered 2013-05-24: 1 [IU] via SUBCUTANEOUS
  Administered 2013-05-24 (×2): 9 [IU] via SUBCUTANEOUS
  Administered 2013-05-25: 7 [IU] via SUBCUTANEOUS
  Administered 2013-05-25 – 2013-05-26 (×3): 2 [IU] via SUBCUTANEOUS
  Administered 2013-05-26: 3 [IU] via SUBCUTANEOUS
  Administered 2013-05-27: 5 [IU] via SUBCUTANEOUS
  Administered 2013-05-27: 9 [IU] via SUBCUTANEOUS

## 2013-05-20 MED ORDER — ALTEPLASE 2 MG IJ SOLR
2.0000 mg | Freq: Once | INTRAMUSCULAR | Status: DC | PRN
  Filled 2013-05-20: qty 2

## 2013-05-20 MED ORDER — DEXTROSE 50 % IV SOLN
50.0000 mL | Freq: Once | INTRAVENOUS | Status: AC | PRN
  Administered 2013-05-20: 50 mL via INTRAVENOUS

## 2013-05-20 MED ORDER — INSULIN ASPART 100 UNIT/ML ~~LOC~~ SOLN: 2.0000 [IU] | Freq: Every day | SUBCUTANEOUS | Status: DC

## 2013-05-20 MED ORDER — INSULIN ASPART 100 UNIT/ML ~~LOC~~ SOLN
0.0000 [IU] | Freq: Three times a day (TID) | SUBCUTANEOUS | Status: DC
  Administered 2013-05-20: 20 [IU] via SUBCUTANEOUS

## 2013-05-20 MED ORDER — LIDOCAINE-PRILOCAINE 2.5-2.5 % EX CREA
1.0000 "application " | TOPICAL_CREAM | CUTANEOUS | Status: DC | PRN
  Filled 2013-05-20: qty 5

## 2013-05-20 MED ORDER — INSULIN ASPART 100 UNIT/ML ~~LOC~~ SOLN: 3.0000 [IU] | SUBCUTANEOUS | Status: DC

## 2013-05-20 MED ORDER — LIDOCAINE HCL (PF) 1 % IJ SOLN: 5.0000 mL | INTRAMUSCULAR | Status: DC | PRN

## 2013-05-20 MED ORDER — PENTAFLUOROPROP-TETRAFLUOROETH EX AERO: 1.0000 "application " | INHALATION_SPRAY | CUTANEOUS | Status: DC | PRN

## 2013-05-20 MED ORDER — INSULIN ASPART 100 UNIT/ML ~~LOC~~ SOLN
0.0000 [IU] | Freq: Every day | SUBCUTANEOUS | Status: DC
  Administered 2013-05-20: 3 [IU] via SUBCUTANEOUS
  Administered 2013-05-21: 5 [IU] via SUBCUTANEOUS
  Administered 2013-05-22: 4 [IU] via SUBCUTANEOUS
  Administered 2013-05-23: 2 [IU] via SUBCUTANEOUS

## 2013-05-20 NOTE — Progress Notes (Signed)
Subjective:  No current complaints, no dyspnea  Objective: Vital signs in last 24 hours: Temp:  [97.2 F (36.2 C)-98.4 F (36.9 C)] 98 F (36.7 C) (02/03 0518) Pulse Rate:  [77-92] 77 (02/03 0518) Resp:  [17-22] 20 (02/03 0518) BP: (109-221)/(69-113) 181/81 mmHg (02/03 0518) SpO2:  [92 %-99 %] 98 % (02/03 0518) Weight:  [60.1 kg (132 lb 7.9 oz)-64.5 kg (142 lb 3.2 oz)] 60.1 kg (132 lb 7.9 oz) (02/02 2330) Weight change:   Intake/Output from previous day: 02/02 0701 - 02/03 0700 In: 120 [P.O.:120] Out: 3990    Lab Results:  Recent Labs  05/19/13 1335  WBC 6.1  HGB 12.3*  HCT 37.2*  PLT 196   BMET:  Recent Labs  05/19/13 1335 05/19/13 1951  NA 130* 125*  K 6.5* >7.7*  CL 89* 86*  CO2 21 19  GLUCOSE 115* 382*  BUN 66* 70*  CREATININE 8.85* 9.11*  CALCIUM 8.9 8.0*  ALBUMIN 3.1* 2.9*   No results found for this basename: PTH,  in the last 72 hours Iron Studies: No results found for this basename: IRON, TIBC, TRANSFERRIN, FERRITIN,  in the last 72 hours  EXAM: General appearance:  Alert, in no apparent distress Resp:  CTA without rales, rhonchi, or wheezes Cardio:  RRR without murmur or rub GI: + BS, soft and nontender Extremities:  No edema Access:  R femoral catheter  Dialysis Orders: MWF @ [facility]  4:15 58 kg 2K/2.5Ca 450/A1.5 Heparin 0 R femoral catheter  Hectorol 0 Epogen 0 Venofer 100 mg x 10 (2/2 - 23)  Assessment/Plan: 1. 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 2. Hypoglycemia - per primary. 3. Hyperkalemia - K > 7.7 yesterday pre-HD.  Recheck K. 4. ESRD - HD on MWF @ [facility].  Next HD likely tomorrow, but pending K. 5. Hypertension/volume - BP 181/81, on outpatient Amlodipine 10 mg qd, Metoprolol 75 mg bid except pre-HD; wt 60.1 kg with EDW 58 s/p net UF 4 L yesterday.  6. Anemia - Hgb 12.3, no outpatient Epogen, but started Venofer series  yesterday. 7. Metabolic bone disease - Ca 8 (8.9 corrected), P 6.6, iPTH 459; no Hectorol, phoslo 2 with meals. 8. Nutrition - Alb 2.9, renal diet & vitamin. 9. Hx infected access - R thigh AVG removed 12/31, now with R femoral catheter, plans to move catheter to IJ and place new R thigh AVG 2/27 per [facility]. 10. Hx thyroid goiter - s/p neck exploration & lymph node biopsy (benign) per Dr. Gerrit FriendsGerkin 01/28/13, but may require thyroidectomy involving median sternotomy; on Synthroid.     LOS: 1 day   Marcus [name],[place] 05/20/2013,7:46 AM

## 2013-05-20 NOTE — Progress Notes (Addendum)
Inpatient Diabetes Program Recommendations  AACE/ADA: New Consensus Statement on Inpatient Glycemic Control (2013)  Target Ranges:  Prepandial:   less than 140 mg/dL      Peak postprandial:   less than 180 mg/dL (1-2 hours)      Critically ill patients:  140 - 180 mg/dL     Results for Marcus [name], Marcus [name] (MRN [id number]) as of 05/20/2013 09:23  Ref. Range 05/19/2013 12:26 05/19/2013 13:33 05/19/2013 16:18  Glucose-Capillary Latest Range: 70-99 mg/dL 66 (L) 409115 (H) 811274 (H)    Results for Marcus [name], Marcus [name] (MRN [id number]) as of 05/20/2013 09:23  Ref. Range 05/20/2013 00:06 05/20/2013 04:29 05/20/2013 07:57  Glucose-Capillary Latest Range: 70-99 mg/dL 213223 (H) 086447 (H) 578549 (H)    **Admitted with mild hypoglycemia.  Got no insulin yesterday.  CBGs up today to 549 mg/dl.  **Note that patient received 20 units Novolog this morning for CBG of 549 mg/dl.  Patient now at high risk for hypoglycemia.  Will alert RN caring for patient to be vigilant for hypoglycemia.  **Note also that Lantus and Novolog meal coverage started today.  Patient scheduled to receive 5 units of Lantus this morning.  Will start home Novolog meal coverage at noon today.   **MD- May want to decrease Novolog SSI to Sensitive scale tid ac + HS for now.   Will follow closely. Ambrose FinlandJeannine Johnston Plummer Matich RN, MSN, CDE Diabetes Coordinator Inpatient Diabetes Program Team Pager: [phone] (8a-10p)

## 2013-05-20 NOTE — Progress Notes (Addendum)
ANTICOAGULATION CONSULT NOTE - Follow up Consult  Pharmacy Consult for Heparin + Coumadin  Indication: New LUE DVT  Allergies  Allergen Reactions  . Penicillins Other (See Comments)    Unknown reaction    Patient Measurements: Height: 6\' 2"  (188 cm) Weight: 135 lb 9.3 oz (61.5 kg) IBW/kg (Calculated) : 82.2 Heparin Dosing Weight: n/a   Vital Signs: Temp: 97.8 F (36.6 C) (02/03 1320) Temp src: Oral (02/03 1320) BP: 152/90 mmHg (02/03 1320) Pulse Rate: 65 (02/03 1320)  Labs:  Recent Labs  05/19/13 1335 05/19/13 1951 05/20/13 0700  HGB 12.3*  --   --   HCT 37.2*  --   --   PLT 196  --   --   LABPROT 13.3  --  13.8  INR 1.03  --  1.08  HEPARINUNFRC  --   --  0.47  CREATININE 8.85* 9.11* 6.82*    Estimated Creatinine Clearance: 10.4 ml/min (by C-G formula based on Cr of 6.82).   Assessment: [age] YOM with h/o of multiple DVTs, PEs [facility] with [facility] upper extremity pain. [facility] distal subclavian DVT. Pt reports he has not taken his coumadin in approximately 1 month b/c he could not get an order for it. He is compliant with all of his other medications including coumadin. He is now on Coumadin with heparin bridge for DVT till INR > 2. Patient currently in HD. Level has not been drawn yet. HD nurse reports no unusual s/s of bleeding. Will reschedule heparin level for after HD. No CBC today but daily CBC to start at midnight tonight. INR 1.03>>1.08.   Goal of Therapy:  Heparin level 0.3-0.7 units/ml Monitor platelets by anticoagulation protocol: Yes   Plan:  Coumadin 10 mg x 1 dose tonight  Continue heparin infusion at 1000 units/hr Check anti-Xa level in 8 hours and daily while on heparin Continue to monitor H&H and platelets daily   Marcus [name], PharmD.  Clinical Pharmacist Pager [phone]   Addendum: Heparin level was able to be drawn prior to HD. Heparin level is now subtherapeutic at 0.28 - will increase rate  and follow-up AM heparin level. - No significant bleeding reported  Plan: 1. Increase heparin drip to 1100 units/hr (11 ml/hr) 2. Follow-up AM heparin level, CBC and INR  Marcus [name], PharmD Clinical Pharmacist [phone] 05/20/2013, 5:45 PM

## 2013-05-20 NOTE — Progress Notes (Addendum)
INITIAL NUTRITION ASSESSMENT  DOCUMENTATION CODES Per approved criteria  -Severe malnutrition in the context of chronic illness -Underweight   INTERVENTION: Add Nepro Shake po BID, each supplement provides 425 kcal and 19 grams protein. RD to continue to follow nutrition care plan.  NUTRITION DIAGNOSIS: Increased nutrient needs related to HD and repletion as evidenced by estimated needs.  Goal: Intake to meet >90% of estimated nutrition needs.  Monitor:  weight trends, lab trends, I/O's, PO intake, supplement tolerance  Reason for Assessment: Malnutrition Screening Tool  58 y.o. male  Admitting Dx: Arm DVT (deep venous thromboembolism), acute  ASSESSMENT: PMHx significant for CVA, GIB, ESRD (on HD), DM, CHF. Admitted with upper L extremity pain. Work-up reveals L distal subclavian DVT. Pt also reports that he has been to the ER 5 times last week 2/2 AMS from hypoglycemia.  Pt required removal of his right thigh graft 12/31 secondary to infection and currently dialyzes with a right femoral catheter. Has been seen by VVS recently with plans to place a new R thigh graft eventually.  Pt with elevate K+, received HD and is trending down. Remains elevated and is at 6.4 presently. Phosphorus is elevated at 6.1.  Pt agreeable to supplements, states variable appetite with blood sugar control. Enjoys Glucerna, will send Nepro 2/2 K+ levels.  Currently ordered for Renal 80-90 diet; eating 85% of meals.  Nutrition Focused Physical Exam:  Subcutaneous Fat:  Orbital Region: WNL Upper Arm Region: moderate depletion Thoracic and Lumbar Region: moderate depletion  Muscle:  Temple Region: WNL Clavicle Bone Region: WNL Clavicle and Acromion Bone Region: n/a Scapular Bone Region: n/a Dorsal Hand: WNL Patellar Region: severe depletion Anterior Thigh Region: severe depletion Posterior Calf Region: severe delpletion  Edema: n/a  Pt meets criteria for severe MALNUTRITION in the  context of chronic illness as evidenced by severe muscle mass loss and <75% intake x at least 1 month.   Height: Ht Readings from Last 1 Encounters:  05/19/13 6\' 2"  (1.88 m)    Weight: Wt Readings from Last 1 Encounters:  05/19/13 132 lb 7.9 oz (60.1 kg)    Ideal Body Weight: 190 lb  % Ideal Body Weight: 69%  Wt Readings from Last 25 Encounters:  05/19/13 132 lb 7.9 oz (60.1 kg)  05/12/13 135 lb (61.236 kg)  04/29/13 137 lb 9.6 oz (62.415 kg)  04/21/13 132 lb 7.9 oz (60.099 kg)  04/21/13 132 lb 7.9 oz (60.099 kg)  03/15/13 137 lb 12.6 oz (62.5 kg)  03/15/13 137 lb 12.6 oz (62.5 kg)  03/11/13 136 lb 11 oz (62 kg)  03/11/13 136 lb 11 oz (62 kg)  03/11/13 136 lb 11 oz (62 kg)  02/20/13 140 lb (63.504 kg)  01/29/13 133 lb 2.5 oz (60.4 kg)  01/29/13 133 lb 2.5 oz (60.4 kg)  01/23/13 140 lb 6.4 oz (63.685 kg)  01/16/13 136 lb 9.6 oz (61.961 kg)  12/31/12 140 lb 12.8 oz (63.866 kg)  04/18/12 147 lb 4.8 oz (66.815 kg)  03/28/12 145 lb (65.772 kg)  03/28/12 145 lb (65.772 kg)  03/21/12 142 lb (64.411 kg)  09/25/11 137 lb 12.6 oz (62.5 kg)   Usual Body Weight: 137 lb  % Usual Body Weight: 96%  BMI:  Body mass index is 17 kg/(m^2).  Underweight  Estimated Nutritional Needs: Kcal: 1800 - 2000 Protein: 75 - 90 g Fluid: 1.2 liters  Skin: abrasion to L leg  Diet Order: Renal  EDUCATION NEEDS: -No education needs identified at this time  Intake/Output Summary (Last 24 hours) at 05/20/13 1149 Last data filed at 05/20/13 0954  Gross per 24 hour  Intake    320 ml  Output   3990 ml  Net  -3670 ml    Last BM: PTA  Labs:   Recent Labs Lab 05/19/13 1335 05/19/13 1951 05/20/13 0700  NA 130* 125* 123*  K 6.5* >7.7* 6.4*  CL 89* 86* 83*  CO2 21 19 15*  BUN 66* 70* 48*  CREATININE 8.85* 9.11* 6.82*  CALCIUM 8.9 8.0* 8.5  PHOS 5.8* 6.6* 6.1*  GLUCOSE 115* 382* 571*    CBG (last 3)   Recent Labs  05/20/13 0429 05/20/13 0757 05/20/13 1044  GLUCAP 447*  549* 512*    Scheduled Meds: . amLODipine  10 mg Oral QHS  . cloNIDine  0.1 mg Oral TID  . ferric gluconate (FERRLECIT/NULECIT) IV  125 mg Intravenous Q M,W,F-HD  . insulin aspart  0-5 Units Subcutaneous QHS  . insulin aspart  0-9 Units Subcutaneous TID WC  . [START ON 05/21/2013] insulin aspart  2 Units Subcutaneous Q breakfast  . insulin aspart  3 Units Subcutaneous Custom  . insulin glargine  10 Units Subcutaneous QHS  . insulin glargine  5 Units Subcutaneous Daily  . levothyroxine  168 mcg Oral QAC breakfast  . metoprolol  75 mg Oral BID  . pantoprazole  40 mg Oral Daily  . sevelamer carbonate  800 mg Oral TID WC  . simvastatin  5 mg Oral q1800  . sodium chloride  3 mL Intravenous Q12H  . sodium chloride  3 mL Intravenous Q12H  . temazepam  15 mg Oral Custom  . Warfarin - Pharmacist Dosing Inpatient   Does not apply q1800    Continuous Infusions: . heparin 1,000 Units/hr (05/19/13 1520)    Past Medical History  Diagnosis Date  . Hypothyroidism   . Hyperlipidemia   . CVA (cerebrovascular accident)     hx CVA x 2  . GI bleed     2006, mallory-weiss tear  . Pulmonary edema     nov 2011, dry wt decreased  . Goiter     by CT Apr 2013  . Depression   . Cocaine abuse     past history  . Tobacco abuse     history of  . GERD (gastroesophageal reflux disease)   . Cataract   . Insomnia   . Chronic pain   . HTN (hypertension)     sees Dr. Ardelle Park, Rosalita Levan  . Neuromuscular disorder     diabetic neuropathy  . Occlusion of carotid stent     Per patients nursing aid  . ESRD (end stage renal disease) on dialysis     hd - [facility] [place]  . Pneumonia     hx of  . Diabetes mellitus     Sees Dr. Reather Littler [phone] -- fasting blood 70-110s  . DKA (diabetic ketoacidosis)     severe, Dec 2011  . History of blood transfusion   . On home oxygen therapy     2l nasal cannula at night  . Hemodialysis patient     M, W, F  . CHF (congestive heart failure)   . DVT (deep  venous thrombosis)     Past Surgical History  Procedure Laterality Date  . Av fistula placement  03/25/2008    right forearm  . Av fistula placement  04/23/2012    Procedure: INSERTION OF ARTERIOVENOUS (AV) GORE-TEX GRAFT THIGH;  Surgeon: Leonette Most  Holland CommonsE Fields, MD;  Location: [facility] OR;  Service: Vascular;  Laterality: Left;  . Arteriovenous graft placement Left     thigh  . Carotid stent insertion Right   . Dyalisis catheter    . Eye surgery Bilateral     cataracts  . Thyroid surgery  01/28/2013    Dr Gerrit FriendsGerkin  . Lymph node biopsy Right 01/28/2013    Procedure: LYMPH NODE BIOPSY;  Surgeon: Velora Hecklerodd M Gerkin, MD;  [facility];  Service: General;  Laterality: Right;  . Mass biopsy N/A 01/28/2013    Procedure: NECK EXPLORATION;  Surgeon: Velora Hecklerodd M Gerkin, MD;  [facility];  Service: General;  Laterality: N/A;  . Ligation arteriovenous gortex graft Right 02/27/2013    Procedure: LIGATION ARTERIOVENOUS GORTEX GRAFT;  Surgeon: Chuck Hinthristopher S Dickson, MD;  [facility];  Service: Vascular;  Laterality: Right;  . Ligation arteriovenous gortex graft Left 03/03/2013    Procedure: LIGATION Left ARTERIOVENOUS GORTEX Thigh GRAFT;  Surgeon: Fransisco HertzBrian L Chen, MD;  [facility];  Service: Vascular;  Laterality: Left;  . Insertion of dialysis catheter Right 03/03/2013    Procedure: INSERTION OF DIALYSIS CATHETER;  Surgeon: Fransisco HertzBrian L Chen, MD;  [facility];  Service: Vascular;  Laterality: Right;  . Av fistula placement Left 03/14/2013    Procedure: INSERTION OF  ARTERIOVENOUS (AV) GORE-TEX GRAFT  LEFT THIGH;  Surgeon: Larina Earthlyodd F Early, MD;  [facility];  Service: Vascular;  Laterality: Left;  . Avgg removal Left 04/16/2013    Procedure: REMOVAL OF ARTERIOVENOUS GORETEX GRAFT Left Thigh;  Surgeon: Nada LibmanVance W Brabham, MD;  [facility];  Service: Vascular;  Laterality: Left;  . Patch angioplasty Left 04/16/2013    Procedure: Patch Angioplasty of left femoral artery;  Surgeon: Nada LibmanVance W Brabham, MD;  [facility];  Service: Vascular;  Laterality: Left;    Jarold MottoSamantha Bettylee Feig MS, RD, LDN Pager: [phone] After-hours pager: [phone]

## 2013-05-20 NOTE — Progress Notes (Signed)
Pt. CBG = 549 @ 07:57.  MD notified @ 08:05.  Peri MarisAndrew Elowyn Raupp, MBA, BS, RN

## 2013-05-20 NOTE — Clinical Documentation Improvement (Signed)
  Possible Clinical Conditions?     Severe Malnutrition      Severe Protein Calorie Malnutrition    Cannot clinically determine  Supporting Information:  Per Nutrition Consult on 05/20/13, "Pt meets criteria for severe MALNUTRITION in the context of chronic illness as evidenced by severe muscle mass loss and <75% intake x at least 1 month."   Diagnostics:  BMI 17 kg/(m^2)  Treatment:  Nutritionist recommends "Add Nepro Shake po BID, each supplement provides 425 kcal and 19 grams protein."   Thank You,  Darla LeschesWendy Makaia Rappa, RN, BSN, CCRN Clinical Documentation Improvement Specialist HIM department--[place] Office [phone]

## 2013-05-20 NOTE — Progress Notes (Signed)
[facility] PROGRESS NOTE  Marcus JacksonCharles [name] WUJ:[id number] DOB: 05/25/1955 DOA: 05/19/2013 PCP: Marcus [name], Marcus [name]  Assessment/Plan: Acute Left Upper Extremity DVT  Heparin Drip per pharmacy  Coumadin per pharmacy   Recurrent Hypoglycemia in Type 1 DM  Added back low dose lantus as CBG was >500 this AM off of all meds- before breakfast Placed on SSI and given novolog with meals   ESRD  Appreciate renal consultation  Scheduled dialysis is M/W/F in [place].   Hyperkalemia  - Will not treat with Kayexalate,  -dialysis   uncontrolled hypertension  - Resume home antihypertensives-amlodipine, clonidine and metoprolol   Recent history of infected graft in left groin  Does not appear acutely infected, no drainage.  Will ask VVS for their evaluation and recommendations regarding his previous left groin infection as well as current HD access.   Hypothyroidism  Recent history of elevated TSH.  Synthroid dose was adjusted 12/31.  -does not appear to have been taking medications   Code Status: full Family Communication: patient Disposition Plan: ?   Consultants:  Vascular  renal  Procedures:    Antibiotics:    HPI/Subjective: Says he has a caregiver who gives him meds but he ran out of meds  Objective: Filed Vitals:   05/20/13 0953  BP: 154/94  Pulse: 83  Temp: 97.9 F (36.6 C)  Resp: 18    Intake/Output Summary (Last 24 hours) at 05/20/13 1001 Last data filed at 05/20/13 0954  Gross per 24 hour  Intake    320 ml  Output   3990 ml  Net  -3670 ml   Filed Weights   05/19/13 1830 05/19/13 2130 05/19/13 2330  Weight: 64.5 kg (142 lb 3.2 oz) 60.9 kg (134 lb 4.2 oz) 60.1 kg (132 lb 7.9 oz)    Exam:   General:  Pleasant/cooperative  Cardiovascular: rrr  Respiratory: clear anterior  Abdomen: +BS, soft  Musculoskeletal: wound on left leg   Data Reviewed: Basic Metabolic Panel:  Recent Labs Lab 05/19/13 1335 05/19/13 1951 05/20/13 0700   NA 130* 125* 123*  K 6.5* >7.7* 6.4*  CL 89* 86* 83*  CO2 21 19 15*  GLUCOSE 115* 382* 571*  BUN 66* 70* 48*  CREATININE 8.85* 9.11* 6.82*  CALCIUM 8.9 8.0* 8.5  PHOS 5.8* 6.6* 6.1*   Liver Function Tests:  Recent Labs Lab 05/19/13 1335 05/19/13 1951 05/20/13 0700  ALBUMIN 3.1* 2.9* 3.1*   No results found for this basename: LIPASE, AMYLASE,  in the last 168 hours No results found for this basename: AMMONIA,  in the last 168 hours CBC:  Recent Labs Lab 05/19/13 1335  WBC 6.1  HGB 12.3*  HCT 37.2*  MCV 90.7  PLT 196   Cardiac Enzymes: No results found for this basename: CKTOTAL, CKMB, CKMBINDEX, TROPONINI,  in the last 168 hours BNP (last 3 results) No results found for this basename: PROBNP,  in the last 8760 hours CBG:  Recent Labs Lab 05/19/13 1333 05/19/13 1618 05/20/13 0006 05/20/13 0429 05/20/13 0757  GLUCAP 115* 274* 223* 447* 549*    No results found for this or any previous visit (from the past 240 hour(s)).   Studies: Dg Chest Port 1 View  05/19/2013   CLINICAL DATA:  Shortness of breath.  DVT.  EXAM: PORTABLE CHEST - 1 VIEW  COMPARISON:  CT ANGIO CHEST dated 05/19/2013; DG CHEST PORTABLE dated 05/15/2013  FINDINGS: 1304 hr. The heart size and mediastinal contours are stable. There is superior mediastinal widening corresponding  with thyromegaly on CT. Right brachiocephalic venous stent is noted, occluded on CT. Dialysis catheters are present within the right atrium, inserted inferiorly. The lungs are clear. There is stable blunting of the left costophrenic angle. No pleural effusion or pneumothorax is identified.  IMPRESSION: Stable postoperative findings with chronic blunting of the left costophrenic angle. No acute cardiopulmonary process.   Electronically Signed   By: Roxy Horseman M.D.   On: 05/19/2013 13:27    Scheduled Meds: . amLODipine  10 mg Oral QHS  . cloNIDine  0.1 mg Oral TID  . ferric gluconate (FERRLECIT/NULECIT) IV  125 mg Intravenous Q  M,W,F-HD  . insulin aspart  0-15 Units Subcutaneous TID WC  . insulin aspart  0-5 Units Subcutaneous QHS  . [START ON 05/21/2013] insulin aspart  2 Units Subcutaneous Q breakfast  . insulin aspart  3 Units Subcutaneous Custom  . insulin glargine  10 Units Subcutaneous QHS  . insulin glargine  5 Units Subcutaneous Daily  . levothyroxine  168 mcg Oral QAC breakfast  . metoprolol  75 mg Oral BID  . pantoprazole  40 mg Oral Daily  . sevelamer carbonate  800 mg Oral TID WC  . simvastatin  5 mg Oral q1800  . sodium chloride  3 mL Intravenous Q12H  . sodium chloride  3 mL Intravenous Q12H  . temazepam  15 mg Oral Custom  . Warfarin - Pharmacist Dosing Inpatient   Does not apply q1800   Continuous Infusions: . heparin 1,000 Units/hr (05/19/13 1520)    Principal Problem:   Arm DVT (deep venous thromboembolism), acute Active Problems:   Diabetes mellitus   Hypoglycemia due to insulin   Acute DVT (deep venous thrombosis)    Time spent: 35 min    Marcus [name]  [facility] Pager [phone]. If 7PM-7AM, please contact night-coverage at [email or web address], password [facility] 05/20/2013, 10:01 AM  LOS: 1 day

## 2013-05-20 NOTE — Progress Notes (Signed)
I have seen and examined this patient and agree with the plan of care. Dialysis today hyperkalemia Shakira Los W 05/20/2013, 11:46 AM

## 2013-05-21 LAB — CBC
HEMATOCRIT: 31.3 % — AB (ref 39.0–52.0)
HEMOGLOBIN: 10.3 g/dL — AB (ref 13.0–17.0)
MCH: 29.5 pg (ref 26.0–34.0)
MCHC: 32.9 g/dL (ref 30.0–36.0)
MCV: 89.7 fL (ref 78.0–100.0)
Platelets: 175 10*3/uL (ref 150–400)
RBC: 3.49 MIL/uL — ABNORMAL LOW (ref 4.22–5.81)
RDW: 15.9 % — ABNORMAL HIGH (ref 11.5–15.5)
WBC: 5 10*3/uL (ref 4.0–10.5)

## 2013-05-21 LAB — PROTIME-INR
INR: 1.07 (ref 0.00–1.49)
Prothrombin Time: 13.7 seconds (ref 11.6–15.2)

## 2013-05-21 LAB — RENAL FUNCTION PANEL
Albumin: 2.7 g/dL — ABNORMAL LOW (ref 3.5–5.2)
BUN: 35 mg/dL — ABNORMAL HIGH (ref 6–23)
CALCIUM: 8.5 mg/dL (ref 8.4–10.5)
CO2: 22 meq/L (ref 19–32)
Chloride: 91 mEq/L — ABNORMAL LOW (ref 96–112)
Creatinine, Ser: 5.04 mg/dL — ABNORMAL HIGH (ref 0.50–1.35)
GFR, EST AFRICAN AMERICAN: 13 mL/min — AB (ref >90)
GFR, EST NON AFRICAN AMERICAN: 12 mL/min — AB (ref >90)
Glucose, Bld: 313 mg/dL — ABNORMAL HIGH (ref 70–99)
PHOSPHORUS: 5.2 mg/dL — AB (ref 2.3–4.6)
POTASSIUM: 4.7 meq/L (ref 3.7–5.3)
SODIUM: 129 meq/L — AB (ref 137–147)

## 2013-05-21 LAB — GLUCOSE, CAPILLARY
Glucose-Capillary: 146 mg/dL — ABNORMAL HIGH (ref 70–99)
Glucose-Capillary: 188 mg/dL — ABNORMAL HIGH (ref 70–99)
Glucose-Capillary: 347 mg/dL — ABNORMAL HIGH (ref 70–99)
Glucose-Capillary: 389 mg/dL — ABNORMAL HIGH (ref 70–99)

## 2013-05-21 LAB — HEPARIN LEVEL (UNFRACTIONATED)
Heparin Unfractionated: 0.25 IU/mL — ABNORMAL LOW (ref 0.30–0.70)
Heparin Unfractionated: 0.6 IU/mL (ref 0.30–0.70)

## 2013-05-21 MED ORDER — PROMETHAZINE HCL 25 MG/ML IJ SOLN
12.5000 mg | Freq: Once | INTRAMUSCULAR | Status: AC
  Administered 2013-05-21: 12.5 mg via INTRAVENOUS
  Filled 2013-05-21: qty 1

## 2013-05-21 MED ORDER — WARFARIN SODIUM 10 MG PO TABS
10.0000 mg | ORAL_TABLET | Freq: Once | ORAL | Status: AC
  Administered 2013-05-21: 10 mg via ORAL
  Filled 2013-05-21: qty 1

## 2013-05-21 NOTE — Progress Notes (Signed)
ANTICOAGULATION CONSULT NOTE - Follow up Consult  Pharmacy Consult for Heparin +Coumadin  Indication: New LUE DVT  Allergies  Allergen Reactions  . Penicillins Other (See Comments)    Unknown reaction    Patient Measurements: Height: 6\' 2"  (188 cm) Weight: 130 lb 1.1 oz (59 kg) IBW/kg (Calculated) : 82.2 Heparin Dosing Weight: n/a   Vital Signs: Temp: 97.4 F (36.3 C) (02/04 1020) Temp src: Oral (02/04 1020) BP: 132/74 mmHg (02/04 1020) Pulse Rate: 77 (02/04 1020)  Labs:  Recent Labs  05/19/13 1335  05/20/13 0700 05/20/13 1300 05/20/13 2004 05/21/13 0548  HGB 12.3*  --   --   --   --  10.3*  HCT 37.2*  --   --   --   --  31.3*  PLT 196  --   --   --   --  175  LABPROT 13.3  --  13.8  --   --  13.7  INR 1.03  --  1.08  --   --  1.07  HEPARINUNFRC  --   --  0.47 0.28*  --  0.25*  CREATININE 8.85*  < > 6.82*  --  4.31* 5.04*  < > = values in this interval not displayed.  Estimated Creatinine Clearance: 13.5 ml/min (by C-G formula based on Cr of 5.04).   Assessment: [age] YOM with h/o of multiple DVTs, PEs [facility] with left upper extremity pain. [facility] distal subclavian DVT. Pt reports he has not taken his coumadin in approximately 1 month b/c he could not get an order for it. He is compliant with all of his other medications including coumadin. He is now on Coumadin with heparin bridge for DVT till INR > 2. INR remains unchanged at 1.07 but he has only gotten 2 doses. Given patient's state of malnutrition and low body weight, will not increase coumadin dose yet. H/H trended down to 10.3/31.3. Plt wnl. Patient reports no unusual s/s of bleeding. He experienced some nausea and vomiting this morning for which he received a dose of zofran.   Goal of Therapy:  Heparin level 0.3-0.7 units/ml Monitor platelets by anticoagulation protocol: Yes   Plan:  Coumadin 10 mg x 1 dose tonight  Continue Heparin infusion at 1250 units/hr  pending new HL  Continue to monitor for s/s of bleeding.  Daily INR and CBC  Vinnie LevelBenjamin Timberlynn Kizziah, PharmD.  Clinical Pharmacist Pager [phone]

## 2013-05-21 NOTE — Progress Notes (Signed)
[facility] PROGRESS NOTE  Marcus [name] ZOX:[id number] DOB: 10/19/1955 DOA: 05/19/2013 PCP: Galvin Proffer, MD  Assessment/Plan: Acute Left Upper Extremity DVT  Heparin Drip per pharmacy  Coumadin per pharmacy   Recurrent Hypoglycemia in Type 1 DM  Added back low dose lantus as CBG was >500 this AM off of all meds- before breakfast Placed on SSI and given novolog with meals   Severe malnutrition  ESRD  Appreciate renal consultation  Scheduled dialysis is M/W/F in [place].   Hyperkalemia  - Will not treat with Kayexalate, resolved  -dialysis   uncontrolled hypertension  - Resume home antihypertensives-amlodipine, clonidine and metoprolol   Recent history of infected graft in left groin  Does not appear acutely infected, no drainage.  Will ask VVS for their evaluation and recommendations regarding his previous left groin infection as well as current HD access.   Hypothyroidism  Recent history of elevated TSH.  Synthroid dose was adjusted 12/31.  -does not appear to have been taking medications   Code Status: full Family Communication: patient Disposition Plan: PT eval   Consultants:  Vascular  renal  Procedures:    Antibiotics:    HPI/Subjective: Dialysis this am Had some nausea which has since resolved  Objective: Filed Vitals:   05/21/13 1020  BP: 132/74  Pulse: 77  Temp: 97.4 F (36.3 C)  Resp: 17    Intake/Output Summary (Last 24 hours) at 05/21/13 1238 Last data filed at 05/21/13 8119  Gross per 24 hour  Intake    492 ml  Output   2759 ml  Net  -2267 ml   Filed Weights   05/20/13 2025 05/21/13 0640 05/21/13 0952  Weight: 61.2 kg (134 lb 14.7 oz) 60.4 kg (133 lb 2.5 oz) 59 kg (130 lb 1.1 oz)    Exam:   General:  Pleasant/cooperative  Cardiovascular: rrr  Respiratory: clear anterior  Abdomen: +BS, soft  Musculoskeletal: wound on left leg   Data Reviewed: Basic Metabolic Panel:  Recent Labs Lab 05/19/13 1335  05/19/13 1951 05/20/13 0700 05/20/13 2004 05/21/13 0548  NA 130* 125* 123* 129* 129*  K 6.5* >7.7* 6.4* 4.3 4.7  CL 89* 86* 83* 90* 91*  CO2 21 19 15* 23 22  GLUCOSE 115* 382* 571* 222* 313*  BUN 66* 70* 48* 25* 35*  CREATININE 8.85* 9.11* 6.82* 4.31* 5.04*  CALCIUM 8.9 8.0* 8.5 8.5 8.5  PHOS 5.8* 6.6* 6.1* 4.6 5.2*   Liver Function Tests:  Recent Labs Lab 05/19/13 1335 05/19/13 1951 05/20/13 0700 05/20/13 2004 05/21/13 0548  ALBUMIN 3.1* 2.9* 3.1* 2.9* 2.7*   No results found for this basename: LIPASE, AMYLASE,  in the last 168 hours No results found for this basename: AMMONIA,  in the last 168 hours CBC:  Recent Labs Lab 05/19/13 1335 05/21/13 0548  WBC 6.1 5.0  HGB 12.3* 10.3*  HCT 37.2* 31.3*  MCV 90.7 89.7  PLT 196 175   Cardiac Enzymes: No results found for this basename: CKTOTAL, CKMB, CKMBINDEX, TROPONINI,  in the last 168 hours BNP (last 3 results) No results found for this basename: PROBNP,  in the last 8760 hours CBG:  Recent Labs Lab 05/20/13 1758 05/20/13 1845 05/20/13 2105 05/21/13 1015 05/21/13 1148  GLUCAP 34* 133* 263* 146* 188*    No results found for this or any previous visit (from the past 240 hour(s)).   Studies: Dg Chest Port 1 View  05/19/2013   CLINICAL DATA:  Shortness of breath.  DVT.  EXAM: PORTABLE  CHEST - 1 VIEW  COMPARISON:  CT ANGIO CHEST dated 05/19/2013; DG CHEST PORTABLE dated 05/15/2013  FINDINGS: 1304 hr. The heart size and mediastinal contours are stable. There is superior mediastinal widening corresponding with thyromegaly on CT. Right brachiocephalic venous stent is noted, occluded on CT. Dialysis catheters are present within the right atrium, inserted inferiorly. The lungs are clear. There is stable blunting of the left costophrenic angle. No pleural effusion or pneumothorax is identified.  IMPRESSION: Stable postoperative findings with chronic blunting of the left costophrenic angle. No acute cardiopulmonary process.    Electronically Signed   By: Roxy HorsemanBill  Veazey M.D.   On: 05/19/2013 13:27    Scheduled Meds: . amLODipine  10 mg Oral QHS  . cloNIDine  0.1 mg Oral TID  . feeding supplement (NEPRO CARB STEADY)  237 mL Oral BID BM  . ferric gluconate (FERRLECIT/NULECIT) IV  125 mg Intravenous Q M,W,F-HD  . insulin aspart  0-5 Units Subcutaneous QHS  . insulin aspart  0-9 Units Subcutaneous TID WC  . insulin glargine  5 Units Subcutaneous Daily  . insulin glargine  7 Units Subcutaneous QHS  . levothyroxine  168 mcg Oral QAC breakfast  . metoprolol  75 mg Oral BID  . pantoprazole  40 mg Oral Daily  . sevelamer carbonate  800 mg Oral TID WC  . simvastatin  5 mg Oral q1800  . sodium chloride  3 mL Intravenous Q12H  . sodium chloride  3 mL Intravenous Q12H  . temazepam  15 mg Oral Custom  . warfarin  10 mg Oral ONCE-1800  . Warfarin - Pharmacist Dosing Inpatient   Does not apply q1800   Continuous Infusions: . heparin 1,250 Units/hr (05/21/13 1159)    Principal Problem:   Arm DVT (deep venous thromboembolism), acute Active Problems:   Diabetes mellitus   Hypoglycemia due to insulin   Acute DVT (deep venous thrombosis)    Time spent: 35 min    Marcus [name]  [facility] Pager [phone]. If 7PM-7AM, please contact night-coverage at [email or web address], password [email or web address] 05/21/2013, 12:38 PM  LOS: 2 days

## 2013-05-21 NOTE — Progress Notes (Signed)
Patient nausea/vomiting, was given Zofran IV at 17:15.  MD notified

## 2013-05-21 NOTE — Progress Notes (Signed)
ANTICOAGULATION CONSULT NOTE - Follow Up Consult  Pharmacy Consult for heparin Indication: DVT  Labs:  Recent Labs  05/19/13 1335  05/20/13 0700 05/20/13 1300 05/20/13 2004 05/21/13 0548  HGB 12.3*  --   --   --   --  10.3*  HCT 37.2*  --   --   --   --  31.3*  PLT 196  --   --   --   --  175  LABPROT 13.3  --  13.8  --   --  13.7  INR 1.03  --  1.08  --   --  1.07  HEPARINUNFRC  --   --  0.47 0.28*  --  0.25*  CREATININE 8.85*  < > 6.82*  --  4.31* 5.04*  < > = values in this interval not displayed.   Assessment: 58yo male now with lower heparin level despite rate increase yesterday.  Goal of Therapy:  Heparin level 0.3-0.7 units/ml   Plan:  Will increase heparin gtt by 2 units/kg/hr to 1250 units/hr and check level in 8hr.  Vernard GamblesVeronda Cartina Brousseau, PharmD, BCPS  05/21/2013,6:43 AM

## 2013-05-21 NOTE — Procedures (Signed)
I have seen and examined this patient and agree with the plan of care  Seen on dialysis no complaints  Teryn Gust W 05/21/2013, 7:32 AM

## 2013-05-21 NOTE — Progress Notes (Signed)
Utilization review completed.  

## 2013-05-21 NOTE — Progress Notes (Signed)
ANTICOAGULATION CONSULT NOTE: HEPARIN Indication: New LUE DVT  Heparin level = 0.6 on 1250 units/hr Goal heparin level: ).3-0.6  The heparin level is within the desired range. No changes needed. Will continue to monitor level, platelets and H/H with daily am labs.  Marcus PeachBarbara Milica [name], PharmD

## 2013-05-22 ENCOUNTER — Inpatient Hospital Stay (HOSPITAL_COMMUNITY): Payer: Medicare Other

## 2013-05-22 LAB — GLUCOSE, CAPILLARY
GLUCOSE-CAPILLARY: 182 mg/dL — AB (ref 70–99)
GLUCOSE-CAPILLARY: 366 mg/dL — AB (ref 70–99)
Glucose-Capillary: 76 mg/dL (ref 70–99)
Glucose-Capillary: 333 mg/dL — ABNORMAL HIGH (ref 70–99)

## 2013-05-22 LAB — HEPARIN LEVEL (UNFRACTIONATED)
Heparin Unfractionated: 0.14 IU/mL — ABNORMAL LOW (ref 0.30–0.70)
Heparin Unfractionated: 0.35 IU/mL (ref 0.30–0.70)

## 2013-05-22 LAB — CBC
HEMATOCRIT: 30.8 % — AB (ref 39.0–52.0)
HEMOGLOBIN: 9.7 g/dL — AB (ref 13.0–17.0)
MCH: 28.8 pg (ref 26.0–34.0)
MCHC: 31.5 g/dL (ref 30.0–36.0)
MCV: 91.4 fL (ref 78.0–100.0)
Platelets: 188 10*3/uL (ref 150–400)
RBC: 3.37 MIL/uL — ABNORMAL LOW (ref 4.22–5.81)
RDW: 16 % — ABNORMAL HIGH (ref 11.5–15.5)
WBC: 6.6 10*3/uL (ref 4.0–10.5)

## 2013-05-22 LAB — PROTIME-INR
INR: 1.23 (ref 0.00–1.49)
Prothrombin Time: 15.2 seconds (ref 11.6–15.2)

## 2013-05-22 MED ORDER — WARFARIN SODIUM 2.5 MG PO TABS
12.5000 mg | ORAL_TABLET | Freq: Once | ORAL | Status: AC
  Administered 2013-05-22: 12.5 mg via ORAL
  Filled 2013-05-22: qty 1

## 2013-05-22 MED ORDER — SODIUM CHLORIDE 0.9 % IV SOLN: 100.0000 mL | INTRAVENOUS | Status: DC | PRN

## 2013-05-22 MED ORDER — LIDOCAINE HCL (PF) 1 % IJ SOLN: 5.0000 mL | INTRAMUSCULAR | Status: DC | PRN

## 2013-05-22 MED ORDER — DARBEPOETIN ALFA-POLYSORBATE 60 MCG/0.3ML IJ SOLN
60.0000 ug | INTRAMUSCULAR | Status: DC
  Filled 2013-05-22: qty 0.3

## 2013-05-22 MED ORDER — METOCLOPRAMIDE HCL 5 MG/ML IJ SOLN
5.0000 mg | Freq: Three times a day (TID) | INTRAMUSCULAR | Status: DC
  Administered 2013-05-22 – 2013-05-25 (×10): 5 mg via INTRAVENOUS
  Filled 2013-05-22 (×2): qty 2
  Filled 2013-05-22 (×8): qty 1
  Filled 2013-05-22: qty 2
  Filled 2013-05-22: qty 1

## 2013-05-22 MED ORDER — ALTEPLASE 2 MG IJ SOLR
2.0000 mg | Freq: Once | INTRAMUSCULAR | Status: AC | PRN
  Filled 2013-05-22: qty 2

## 2013-05-22 MED ORDER — NEPRO/CARBSTEADY PO LIQD: 237.0000 mL | ORAL | Status: DC | PRN

## 2013-05-22 MED ORDER — HEPARIN SODIUM (PORCINE) 1000 UNIT/ML DIALYSIS
1000.0000 [IU] | INTRAMUSCULAR | Status: DC | PRN
  Filled 2013-05-22: qty 1

## 2013-05-22 MED ORDER — LIDOCAINE-PRILOCAINE 2.5-2.5 % EX CREA
1.0000 | TOPICAL_CREAM | CUTANEOUS | Status: DC | PRN
Start: 2013-05-22 — End: 2013-05-23

## 2013-05-22 MED ORDER — SODIUM CHLORIDE 0.9 % IV SOLN
100.0000 mL | INTRAVENOUS | Status: DC | PRN
Start: 2013-05-22 — End: 2013-05-23

## 2013-05-22 MED ORDER — PENTAFLUOROPROP-TETRAFLUOROETH EX AERO: 1.0000 "application " | INHALATION_SPRAY | CUTANEOUS | Status: DC | PRN

## 2013-05-22 NOTE — Progress Notes (Addendum)
ANTICOAGULATION CONSULT NOTE - Follow up Consult  Pharmacy Consult for Heparin +Coumadin  Indication: New LUE DVT  Allergies  Allergen Reactions  . Penicillins Other (See Comments)    Unknown reaction    Patient Measurements: Height: 6\' 2"  (188 cm) Weight: 133 lb 13.1 oz (60.7 kg) IBW/kg (Calculated) : 82.2 Heparin Dosing Weight: n/a   Vital Signs: Temp: 98.3 F (36.8 C) (02/05 0900) Temp src: Oral (02/05 0900) BP: 130/70 mmHg (02/05 0900) Pulse Rate: 65 (02/05 0900)  Labs:  Recent Labs  05/19/13 1335  05/20/13 0700  05/20/13 2004 05/21/13 0548 05/21/13 1414 05/22/13 0436  HGB 12.3*  --   --   --   --  10.3*  --  9.7*  HCT 37.2*  --   --   --   --  31.3*  --  30.8*  PLT 196  --   --   --   --  175  --  188  LABPROT 13.3  --  13.8  --   --  13.7  --  15.2  INR 1.03  --  1.08  --   --  1.07  --  1.23  HEPARINUNFRC  --   --  0.47  < >  --  0.25* 0.60 0.14*  CREATININE 8.85*  < > 6.82*  --  4.31* 5.04*  --   --   < > = values in this interval not displayed.  Estimated Creatinine Clearance: 13.9 ml/min (by C-G formula based on Cr of 5.04).   Assessment: [age] YOM with h/o of multiple DVTs, PEs presented [facility] ER with left upper extremity pain. [facility] showed left distal subclavian DVT. Pt reports he has not taken his coumadin in approximately 1 month b/c he could not get an order for it. He is compliant with all of his other medications including coumadin. He is now on Coumadin with heparin bridge for DVT till INR > 2. INR has trended up slowly to 1.23 after 3 doses of coumadin. He also seems to be eating ~50% of his meals. H/H continues to trend down to 9.7/30.8. Plt wnl. No unusual s/s of bleeding noted.   Goal of Therapy:  Heparin level 0.3-0.7 units/ml Monitor platelets by anticoagulation protocol: Yes   Plan:  Coumadin 12.5 mg x 1 dose tonight  Continue Heparin infusion at 1500 units/hr pending new HL  F/u 8hr HL at 1430.  Continue to  monitor for s/s of bleeding.  Daily INR and CBC  Vinnie LevelBenjamin Corinthian Kemler, PharmD.  Clinical Pharmacist Pager [phone]    Addendum: 8 hr HL is now therapeutic at 0.35. No other changes noted from earlier. Will continue heparin at current infusion rate of 1500 units/hr and f/u with 8-hr HL.   Vinnie LevelBenjamin Freddie Nghiem, PharmD.  Clinical Pharmacist Pager [phone]

## 2013-05-22 NOTE — Evaluation (Signed)
Physical Therapy Evaluation Patient Details Name: Marcus [name] MRN: [id number] DOB: 10/29/1955 Today's Date: 05/22/2013 Time: 4098-1191 PT Time Calculation (min): 30 min  PT Assessment / Plan / Recommendation History of Present Illness  Marcus [name]  is a 58 y.o. male with a history listed above but significant for multiple DVTs, PEs, CVAs and ESRD who dialyzes normally on M/W/F.  Marcus [name] presented to the [facility] ER with left upper extremity pain. Ultrasound at [facility] showed a left distal subclavian DVT.  Marcus [name] reports that he hasn't taken his coumadin for approximately 1 month because he could not get an order for it  Clinical Impression  Patient presents with decreased independence with mobility due to weakness as well as deficits listed below.  Has had ankle braces in the past, but reports they are old and started "cutting into him."  Feel he would benefit from referral for outpatient orthotic assessment and bracing as he states helped in the past.  Would need PCP to refer.  PT will follow acutely and recommend HHPT for at least safety eval at d/c.    PT Assessment  Patient needs continued PT services    Follow Up Recommendations  Home health PT;Supervision - Intermittent    Does the patient have the potential to tolerate intense rehabilitation    N/A  Barriers to Discharge   None    Equipment Recommendations  None recommended by PT    Recommendations for Other Services   None  Frequency Min 3X/week    Precautions / Restrictions Precautions Precautions: Fall Precaution Comments: states has fallen out of bed twice recently due to low sugar, but no falls from standing   Pertinent Vitals/Pain Denies pain at rest, left UE sore with movement      Mobility  Bed Mobility Overal bed mobility: Modified Independent Transfers Overall transfer level: Needs assistance Equipment used: Rolling walker (2 wheeled) Transfers: Sit to/from Stand Sit to Stand:  Supervision General transfer comment: supervision for safety in room Ambulation/Gait Ambulation/Gait assistance: Min guard;Supervision Ambulation Distance (Feet): 200 Feet Assistive device: Rolling walker (2 wheeled) Gait Pattern/deviations: Step-through pattern;Trunk flexed;Decreased stride length;Steppage General Gait Details: mild instability noted with ambulation with walker, reports general weakness and fatigue after walking        PT Diagnosis: Abnormality of gait;Generalized weakness  PT Problem List: Decreased strength;Decreased balance;Decreased activity tolerance;Decreased mobility PT Treatment Interventions: DME instruction;Functional mobility training;Stair training;Gait training;Therapeutic activities;Balance training     PT Goals(Current goals can be found in the care plan section) Acute Rehab PT Goals Patient Stated Goal: To return to independent PT Goal Formulation: With patient Time For Goal Achievement: 05/29/13 Potential to Achieve Goals: Good  Visit Information  Last PT Received On: 05/22/13 Assistance Needed: +1 History of Present Illness: Marcus [name]  is a 58 y.o. male with a history listed above but significant for multiple DVTs, PEs, CVAs and ESRD who dialyzes normally on M/W/F.  Marcus [name] presented to the [facility] ER with left upper extremity pain. Ultrasound at [facility] showed a left distal subclavian DVT.  Marcus [name] reports that he hasn't taken his coumadin for approximately 1 month because he could not get an order for it       Prior Functioning  Home Living Family/patient expects to be discharged to:: Private residence Living Arrangements: Children;Other (Comment) Available Help at Discharge: Family;Available PRN/intermittently;Friend(s) Type of Home: Apartment Home Access: Stairs to enter Entrance Stairs-Number of Steps: 4 Entrance Stairs-Rails: Right Home Layout: One level Home Equipment: Dan Humphreys -  2 wheels;Shower seat Prior  Function Level of Independence: Independent with assistive device(s) Communication Communication: No difficulties Dominant Hand: Right    Cognition  Cognition Arousal/Alertness: Awake/alert Behavior During Therapy: WFL for tasks assessed/performed Overall Cognitive Status: Within Functional Limits for tasks assessed    Extremity/Trunk Assessment Upper Extremity Assessment Upper Extremity Assessment: Overall WFL for tasks assessed Lower Extremity Assessment Lower Extremity Assessment: RLE deficits/detail;LLE deficits/detail RLE Deficits / Details: absent active ankle DF with PROM WFL, otherwise grossly 4/5 throughout RLE Sensation: history of peripheral neuropathy LLE Deficits / Details: absent active ankle DF with PROM WFL, otherwise grossly 4/5 throughout LLE Sensation: history of peripheral neuropathy   Balance Balance Overall balance assessment: Needs assistance Sitting balance-Leahy Scale: Good Standing balance support: Bilateral upper extremity supported Standing balance-Leahy Scale: Poor Standing balance comment: UE assist needed for stability  End of Session PT - End of Session Equipment Utilized During Treatment: Gait belt Activity Tolerance: Patient tolerated treatment well Patient left: in bed;with call bell/phone within reach;with bed alarm set  GP     [facility] HospitalWYNN,CYNDI 05/22/2013, 10:44 AM Sheran Lawlessyndi Khady Vandenberg, PT [phone] 05/22/2013

## 2013-05-22 NOTE — Progress Notes (Signed)
I have seen and examined this patient and agree with the plan of care.  Firas Guardado W 05/22/2013, 10:48 AM

## 2013-05-22 NOTE — Evaluation (Signed)
Occupational Therapy Evaluation Patient Details Name: Marcus [name] MRN: [id number] DOB: 07/13/1955 Today's Date: 05/22/2013 Time: 4098-1191 OT Time Calculation (min): 19 min  OT Assessment / Plan / Recommendation History of present illness Marcus [name]  is a 58 y.o. male with a history listed above but significant for multiple DVTs, PEs, CVAs and ESRD who dialyzes normally on M/W/F.  Marcus [name] presented to the [name] with left upper extremity pain. Ultrasound at [facility] showed a left distal subclavian DVT.  Marcus [name] that he hasn't taken his coumadin for approximately 1 month because he could not get an order for it   Clinical Impression   Pt is at set up/sup - min guard A level with LB ADLs due to LE weakness and balance deficits. NO further acute OT services indicated at this time and pt to continue with acute PT services for functional mobility safety. Pt has aide at [facility] to assist with ADLs prn and for sup    OT Assessment  Patient does not need any further OT services    Follow Up Recommendations  No OT follow up;SNF    Barriers to Discharge  none    Equipment Recommendations  None recommended by OT    Recommendations for Other Services    Frequency       Precautions / Restrictions Precautions Precautions: Fall Precaution Comments: states has fallen out of bed twice recently due to low sugar, but no falls from standing Restrictions Weight Bearing Restrictions: No   Pertinent Vitals/Pain 3/10 L UE    ADL  Eating/Feeding: Performed;Independent Where Assessed - Eating/Feeding: Edge of bed Grooming: Performed;Wash/dry hands;Wash/dry face;Supervision/safety Where Assessed - Grooming: Supported standing Upper Body Bathing: Simulated;Set up Where Assessed - Upper Body Bathing: Unsupported sitting Lower Body Bathing: Simulated;Set up;Supervision/safety;Min guard Where Assessed - Lower Body Bathing: Unsupported sitting;Supported sit to stand Upper  Body Dressing: Performed;Set up Where Assessed - Upper Body Dressing: Unsupported sitting Lower Body Dressing: Performed;Set up;Min guard;Supervision/safety Where Assessed - Lower Body Dressing: Unsupported sitting;Supported sit to stand Toilet Transfer: Performed;Supervision/safety Toilet Transfer Method: Sit to Barista: Regular height toilet;Grab bars Toileting - Clothing Manipulation and Hygiene: Performed;Supervision/safety Where Assessed - Engineer, mining and Hygiene: Standing Tub/Shower Transfer: Engineer, manufacturing Method: Science writer: Grab bars;Walk in shower Transfers/Ambulation Related to ADLs: supervision for safety in room ADL Comments: min guard A with LB ADLs sit - stand due to decreased balance and LE weakness    OT Diagnosis:    OT Problem List:   OT Treatment Interventions:     OT Goals(Current goals can be found in the care plan section) Acute Rehab OT Goals Patient Stated Goal: To return to independent OT Goal Formulation: With patient  Visit Information  Last OT Received On: 05/22/13 Assistance Needed: +1 History of Present Illness: Marcus [name]  is a 57 y.o. male with a history listed above but significant for multiple DVTs, PEs, CVAs and ESRD who dialyzes normally on M/W/F.  Marcus [name] with left upper extremity pain. Ultrasound at [facility] showed a left distal subclavian DVT.  Marcus [name] that he hasn't taken his coumadin for approximately 1 month because he could not get an order for it       Prior Functioning     Home Living Family/patient expects to be discharged to:: Private residence Living Arrangements: Children;Other (Comment) Available Help at Discharge: Family;Available PRN/intermittently;Friend(s) Type of Home: Apartment Home Access: Stairs to enter Entrance  Stairs-Number of Steps: 4 Entrance  Stairs-Rails: Right Home Layout: One level Home Equipment: Walker - 2 wheels;Shower seat Prior Function Level of Independence: Independent with assistive device(s) Communication Communication: No difficulties Dominant Hand: Right         Vision/Perception Vision - History Patient Visual Report: No change from baseline;Blurring of vision   Cognition  Cognition Arousal/Alertness: Awake/alert Behavior During Therapy: WFL for tasks assessed/performed Overall Cognitive Status: Within Functional Limits for tasks assessed    Extremity/Trunk Assessment Upper Extremity Assessment Upper Extremity Assessment: Overall WFL for tasks assessed Lower Extremity Assessment Lower Extremity Assessment: Defer to PT evaluation RLE Deficits / Details: absent active ankle DF with PROM WFL, otherwise grossly 4/5 throughout RLE Sensation: history of peripheral neuropathy LLE Deficits / Details: absent active ankle DF with PROM WFL, otherwise grossly 4/5 throughout LLE Sensation: history of peripheral neuropathy Cervical / Trunk Assessment Cervical / Trunk Assessment: Normal     Mobility Bed Mobility Overal bed mobility: Modified Independent Transfers Overall transfer level: Needs assistance Equipment used: Rolling walker (2 wheeled) Transfers: Sit to/from Stand Sit to Stand: Supervision General transfer comment: supervision for safety in room          Balance Balance Overall balance assessment: Needs assistance Sitting-balance support: No upper extremity supported;Feet supported Sitting balance-Leahy Scale: Good Standing balance support: Bilateral upper extremity supported;During functional activity Standing balance-Leahy Scale: Poor Standing balance comment: UE assist needed for stability   End of Session OT - End of Session Equipment Utilized During Treatment: Gait belt;Rolling walker Activity Tolerance: Patient tolerated treatment well Patient left: in bed;with bed alarm set;with  call bell/phone within reach;Other (comment) (sitting EOB)  GO     Galen ManilaSpencer, Aran Menning Jeanette 05/22/2013, 12:52 PM

## 2013-05-22 NOTE — Progress Notes (Signed)
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   Hyperkalemia  - Will not treat with Kayexalate, resolved  -dialysis   uncontrolled hypertension  - Resume home antihypertensives-amlodipine, clonidine and metoprolol   Recent history of infected graft in left groin  Does not appear acutely infected, no drainage.  Will ask [facility] for their evaluation and recommendations regarding his previous left groin infection as well as current HD access.   Hypothyroidism  Recent history of elevated TSH.  Synthroid dose was adjusted 12/31.  -does not appear to have been taking medications   Code Status: full Family Communication: patient Disposition Plan: PT eval   Consultants:  Vascular  renal  Procedures:    Antibiotics:    HPI/Subjective: Nausea again this AM  Objective: Filed Vitals:   05/22/13 0529  BP: 162/82  Pulse: 68  Temp: 98.1 F (36.7 C)  Resp: 18    Intake/Output Summary (Last 24 hours) at 05/22/13 0844 Last data filed at 05/22/13 0658  Gross per 24 hour  Intake 799.67 ml  Output   1500 ml  Net -700.33 ml   Filed Weights   05/21/13 0640 05/21/13 0952 05/21/13 2105  Weight: 60.4 kg (133 lb 2.5 oz) 59 kg (130 lb 1.1 oz) 60.7 kg (133 lb 13.1 oz)    Exam:   General:  Pleasant/cooperative  Cardiovascular: rrr  Respiratory: clear anterior  Abdomen: +BS, soft  Musculoskeletal: wound on left leg   Data Reviewed: Basic Metabolic Panel:  Recent Labs Lab 05/19/13 1335 05/19/13 1951  05/20/13 0700 05/20/13 2004 05/21/13 0548  NA 130* 125* 123* 129* 129*  K 6.5* >7.7* 6.4* 4.3 4.7  CL 89* 86* 83* 90* 91*  CO2 21 19 15* 23 22  GLUCOSE 115* 382* 571* 222* 313*  BUN 66* 70* 48* 25* 35*  CREATININE 8.85* 9.11* 6.82* 4.31* 5.04*  CALCIUM 8.9 8.0* 8.5 8.5 8.5  PHOS 5.8* 6.6* 6.1* 4.6 5.2*   Liver Function Tests:  Recent Labs Lab 05/19/13 1335 05/19/13 1951 05/20/13 0700 05/20/13 2004 05/21/13 0548  ALBUMIN 3.1* 2.9* 3.1* 2.9* 2.7*   No results found for this basename: LIPASE, AMYLASE,  in the last 168 hours No results found for this basename: AMMONIA,  in the last 168 hours CBC:  Recent Labs Lab 05/19/13 1335 05/21/13 0548 05/22/13 0436  WBC 6.1 5.0 6.6  HGB 12.3* 10.3* 9.7*  HCT 37.2* 31.3* 30.8*  MCV 90.7 89.7 91.4  PLT 196 175 188   Cardiac Enzymes: No results found for this basename: CKTOTAL, CKMB, CKMBINDEX, TROPONINI,  in the last 168 hours BNP (last 3 results) No results found for this basename: PROBNP,  in the last 8760 hours CBG:  Recent Labs Lab 05/21/13 1015 05/21/13 1148 05/21/13 1657 05/21/13 2104 05/22/13 0734  GLUCAP 146* 188* 347* 389* 76    No results found for this or any previous visit (from the past 240 hour(s)).   Studies: No results found.  Scheduled Meds: . amLODipine  10 mg Oral QHS  . cloNIDine  0.1 mg  Oral TID  . [START ON 05/23/2013] darbepoetin (ARANESP) injection - DIALYSIS  60 mcg Intravenous Q Fri-HD  . feeding supplement (NEPRO CARB STEADY)  237 mL Oral BID BM  . ferric gluconate (FERRLECIT/NULECIT) IV  125 mg Intravenous Q M,W,F-HD  . insulin aspart  0-5 Units Subcutaneous QHS  . insulin aspart  0-9 Units Subcutaneous TID WC  . insulin glargine  5 Units Subcutaneous Daily  . insulin glargine  7 Units Subcutaneous QHS  . levothyroxine  168 mcg Oral QAC breakfast  . metoprolol  75 mg Oral BID  . pantoprazole  40 mg Oral Daily  . sevelamer carbonate  800 mg Oral TID WC  . simvastatin  5 mg Oral q1800   . sodium chloride  3 mL Intravenous Q12H  . sodium chloride  3 mL Intravenous Q12H  . temazepam  15 mg Oral Custom  . Warfarin - Pharmacist Dosing Inpatient   Does not apply q1800   Continuous Infusions: . heparin 1,500 Units/hr (05/22/13 0656)    Principal Problem:   Arm DVT (deep venous thromboembolism), acute Active Problems:   Diabetes mellitus   Hypoglycemia due to insulin   Acute DVT (deep venous thrombosis)    Time spent: 35 min    Marcus [name]  [facility] Pager [phone]. If 7PM-7AM, please contact night-coverage at [email or web address], password [email or web address] 05/22/2013, 8:44 AM  LOS: 3 days

## 2013-05-22 NOTE — Progress Notes (Signed)
ANTICOAGULATION CONSULT NOTE - Follow Up Consult  Pharmacy Consult for heparin Indication: DVT  Labs:  Recent Labs  05/19/13 1335  05/20/13 0700  05/20/13 2004 05/21/13 0548 05/21/13 1414 05/22/13 0436  HGB 12.3*  --   --   --   --  10.3*  --  9.7*  HCT 37.2*  --   --   --   --  31.3*  --  30.8*  PLT 196  --   --   --   --  175  --  188  LABPROT 13.3  --  13.8  --   --  13.7  --  15.2  INR 1.03  --  1.08  --   --  1.07  --  1.23  HEPARINUNFRC  --   --  0.47  < >  --  0.25* 0.60 0.14*  CREATININE 8.85*  < > 6.82*  --  4.31* 5.04*  --   --   < > = values in this interval not displayed.   Assessment: 58yo male now subtherapeutic on heparin after one level at goal with large drop in level overnight; no gtt or other issues per RN.  Goal of Therapy:  Heparin level 0.3-0.7 units/ml   Plan:  Will increase heparin gtt by 4 units/kg/hr to 1500 units/hr and check level in 8hr.  Vernard GamblesVeronda Kyanna Mahrt, PharmD, BCPS  05/22/2013,6:19 AM

## 2013-05-22 NOTE — Progress Notes (Signed)
Subjective:  No complaints, feeling well  Objective: Vital signs in last 24 hours: Temp:  [97.1 F (36.2 C)-98.6 F (37 C)] 98.1 F (36.7 C) (02/05 0529) Pulse Rate:  [68-78] 68 (02/05 0529) Resp:  [13-18] 18 (02/05 0529) BP: (132-162)/(65-91) 162/82 mmHg (02/05 0529) SpO2:  [98 %-100 %] 98 % (02/05 0529) Weight:  [59 kg (130 lb 1.1 oz)-60.7 kg (133 lb 13.1 oz)] 60.7 kg (133 lb 13.1 oz) (02/04 2105) Weight change: -2.5 kg (-5 lb 8.2 oz)  Intake/Output from previous day: 02/04 0701 - 02/05 0700 In: 760 [P.O.:480; I.V.:180] Out: 1500  Intake/Output this shift: Total I/O In: 180 [I.V.:180] Out: -   Lab Results:  Recent Labs  05/21/13 0548 05/22/13 0436  WBC 5.0 6.6  HGB 10.3* 9.7*  HCT 31.3* 30.8*  PLT 175 188   BMET:  Recent Labs  05/20/13 2004 05/21/13 0548  NA 129* 129*  K 4.3 4.7  CL 90* 91*  CO2 23 22  GLUCOSE 222* 313*  BUN 25* 35*  CREATININE 4.31* 5.04*  CALCIUM 8.5 8.5  ALBUMIN 2.9* 2.7*   No results found for this basename: PTH,  in the last 72 hours Iron Studies: No results found for this basename: IRON, TIBC, TRANSFERRIN, FERRITIN,  in the last 72 hours  EXAM:  General appearance: Alert, in no apparent distress  Resp: CTA without rales, rhonchi, or wheezes  Cardio: RRR without murmur or rub  GI: + BS, soft and nontender  Extremities: No edema  Access: R femoral catheter   Dialysis Orders: MWF @ [facility]  4:15 58 kg 2K/2.5Ca 450/A1.5 Heparin 0 R femoral catheter  Hectorol 0 Epogen 0 Venofer 100 mg x 10 (2/2 - 23)  Assessment/Plan: 1. 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 2. Hypoglycemia - per primary. 3. Hyperkalemia - K > 7.7 on admission, resolved, 4.7 pre-HD yesterday. 4. ESRD - HD on MWF @ [facility]. Next HD tomorrow. 5. Hypertension/volume - BP 162/82, on outpatient Amlodipine 10 mg qd, Metoprolol 75 mg bid except pre-HD; wt 60.7 kg  with EDW 58 s/p net UF 1.5 L yesterday.  6. Anemia - Hgb down to 9.7, no outpatient Epogen, but started Venofer series (x 10) 2/2. 7. Metabolic bone disease - Ca 8.5 (9.5 corrected), P 5.2, iPTH 459; no Hectorol, phoslo 2 with meals. 8. Nutrition - Alb 2.7, renal diet & vitamin. 9. Hx infected access - R thigh AVG removed 12/31, now with R femoral catheter, plans to move catheter to IJ and place new R thigh AVG 2/27 per [facility]. 10. Hx thyroid goiter - s/p neck exploration & lymph node biopsy (benign) per Dr. Gerrit FriendsGerkin 01/28/13, but may require thyroidectomy involving median sternotomy; on Synthroid.     LOS: 3 days   Nechuma Boven,Leomar 05/22/2013,6:54 AM

## 2013-05-23 LAB — RENAL FUNCTION PANEL
Albumin: 2.9 g/dL — ABNORMAL LOW (ref 3.5–5.2)
BUN: 46 mg/dL — ABNORMAL HIGH (ref 6–23)
CO2: 23 mEq/L (ref 19–32)
Calcium: 8.9 mg/dL (ref 8.4–10.5)
Chloride: 91 mEq/L — ABNORMAL LOW (ref 96–112)
Creatinine, Ser: 6.22 mg/dL — ABNORMAL HIGH (ref 0.50–1.35)
GFR calc Af Amer: 10 mL/min — ABNORMAL LOW (ref >90)
GFR calc non Af Amer: 9 mL/min — ABNORMAL LOW (ref >90)
Glucose, Bld: 126 mg/dL — ABNORMAL HIGH (ref 70–99)
Phosphorus: 4.9 mg/dL — ABNORMAL HIGH (ref 2.3–4.6)
Potassium: 4.4 mEq/L (ref 3.7–5.3)
Sodium: 131 mEq/L — ABNORMAL LOW (ref 137–147)

## 2013-05-23 LAB — CBC
HCT: 29.2 % — ABNORMAL LOW (ref 39.0–52.0)
Hemoglobin: 9.3 g/dL — ABNORMAL LOW (ref 13.0–17.0)
MCH: 28.9 pg (ref 26.0–34.0)
MCHC: 31.8 g/dL (ref 30.0–36.0)
MCV: 90.7 fL (ref 78.0–100.0)
Platelets: 202 10*3/uL (ref 150–400)
RBC: 3.22 MIL/uL — AB (ref 4.22–5.81)
RDW: 15.9 % — AB (ref 11.5–15.5)
WBC: 8.2 10*3/uL (ref 4.0–10.5)

## 2013-05-23 LAB — HEPARIN LEVEL (UNFRACTIONATED)
HEPARIN UNFRACTIONATED: 0.24 [IU]/mL — AB (ref 0.30–0.70)
Heparin Unfractionated: 0.76 IU/mL — ABNORMAL HIGH (ref 0.30–0.70)
Heparin Unfractionated: 0.39 IU/mL (ref 0.30–0.70)

## 2013-05-23 LAB — GLUCOSE, CAPILLARY
GLUCOSE-CAPILLARY: 75 mg/dL (ref 70–99)
GLUCOSE-CAPILLARY: 202 mg/dL — AB (ref 70–99)
GLUCOSE-CAPILLARY: 215 mg/dL — AB (ref 70–99)
Glucose-Capillary: 104 mg/dL — ABNORMAL HIGH (ref 70–99)
Glucose-Capillary: 348 mg/dL — ABNORMAL HIGH (ref 70–99)

## 2013-05-23 LAB — PROTIME-INR
INR: 1.43 (ref 0.00–1.49)
Prothrombin Time: 17.1 seconds — ABNORMAL HIGH (ref 11.6–15.2)

## 2013-05-23 MED ORDER — DARBEPOETIN ALFA-POLYSORBATE 60 MCG/0.3ML IJ SOLN
INTRAMUSCULAR | Status: AC
  Administered 2013-05-23: 60 ug
  Filled 2013-05-23: qty 0.3

## 2013-05-23 MED ORDER — INSULIN GLARGINE 100 UNIT/ML ~~LOC~~ SOLN
7.0000 [IU] | Freq: Every day | SUBCUTANEOUS | Status: DC
  Administered 2013-05-24: 7 [IU] via SUBCUTANEOUS
  Filled 2013-05-23: qty 0.07

## 2013-05-23 NOTE — Progress Notes (Signed)
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   Hyperkalemia  - Will not treat with Kayexalate, resolved  -dialysis   uncontrolled hypertension  - Resume home antihypertensives-amlodipine, clonidine and metoprolol   Recent history of infected graft in left groin  Does not appear acutely infected, no drainage.  Will ask [facility] for their evaluation and recommendations regarding his previous left groin infection as well as current HD access.   Hypothyroidism  Recent history of elevated TSH.  Synthroid dose was adjusted 12/31.  -does not appear to have been taking medications   Code Status: full Family Communication: patient Disposition Plan: PT eval   Consultants:  Vascular  renal  Procedures:    Antibiotics:    HPI/Subjective: Eating lunch No nausea  Objective: Filed Vitals:   05/23/13 1000  BP: 168/89  Pulse: 77  Temp:   Resp: 18    Intake/Output Summary (Last 24 hours) at 05/23/13 1019 Last data filed at 05/23/13 0739  Gross per 24 hour  Intake   1920 ml  Output      0 ml  Net   1920 ml   Filed Weights   05/21/13 2105 05/22/13 2028 05/23/13 0807  Weight: 60.7 kg (133 lb 13.1 oz) 63 kg (138 lb 14.2 oz) 63.9 kg (140 lb 14 oz)    Exam:   General:  Pleasant/cooperative  Cardiovascular: rrr  Respiratory: clear anterior  Abdomen: +BS, soft  Musculoskeletal: wound on left leg   Data Reviewed: Basic Metabolic Panel:  Recent Labs Lab 05/19/13 1951  05/20/13 0700 05/20/13 2004 05/21/13 0548 05/23/13 0450  NA 125* 123* 129* 129* 131*  K >7.7* 6.4* 4.3 4.7 4.4  CL 86* 83* 90* 91* 91*  CO2 19 15* 23 22 23   GLUCOSE 382* 571* 222* 313* 126*  BUN 70* 48* 25* 35* 46*  CREATININE 9.11* 6.82* 4.31* 5.04* 6.22*  CALCIUM 8.0* 8.5 8.5 8.5 8.9  PHOS 6.6* 6.1* 4.6 5.2* 4.9*   Liver Function Tests:  Recent Labs Lab 05/19/13 1951 05/20/13 0700 05/20/13 2004 05/21/13 0548 05/23/13 0450  ALBUMIN 2.9* 3.1* 2.9* 2.7* 2.9*   No results found for this basename: LIPASE, AMYLASE,  in the last 168 hours No results found for this basename: AMMONIA,  in the last 168 hours CBC:  Recent Labs Lab 05/19/13 1335 05/21/13 0548 05/22/13 0436 05/23/13 0450  WBC 6.1 5.0 6.6 8.2  HGB 12.3* 10.3* 9.7* 9.3*  HCT 37.2* 31.3* 30.8* 29.2*  MCV 90.7 89.7 91.4 90.7  PLT 196 175 188 202   Cardiac Enzymes: No results found for this basename: CKTOTAL, CKMB, CKMBINDEX, TROPONINI,  in the last 168 hours BNP (last 3 results) No results found for this basename: PROBNP,  in the last 8760 hours CBG:  Recent Labs Lab 05/22/13 0734 05/22/13 1135 05/22/13 1649 05/22/13 2027 05/23/13 0739  GLUCAP 76 182* 366* 333* 104*    No results found for this or any previous visit (from the past 240 hour(s)).   Studies: [place] Soft Tissue Head/neck  05/23/2013   CLINICAL DATA:  Neck fullness. Thyromegaly on previous neck CT. History of thyroidectomy.  EXAM: THYROID ULTRASOUND  TECHNIQUE: Ultrasound examination of the thyroid gland and adjacent soft tissues was performed.  COMPARISON:  CT 05/13/2013  FINDINGS: Right thyroid lobe  Measurements: 86 x 46 x 31 mm. Heterogeneous echotexture with hyperemia on color Doppler.  Left thyroid lobe  Measurements: 113 x 49 x 29 mm. Hyperemic; heterogeneous echotexture  Isthmus  Not discretely demarcated  Lymphadenopathy  None visualized.  IMPRESSION: Marked thyromegaly without dominant nodule or lesion.   Electronically Signed   By:  Oley Balm M.D.   On: 05/23/2013 09:10    Scheduled Meds: . amLODipine  10 mg Oral QHS  . cloNIDine  0.1 mg Oral TID  . darbepoetin (ARANESP) injection - DIALYSIS  60 mcg Intravenous Q Fri-HD  . feeding supplement (NEPRO CARB STEADY)  237 mL Oral BID BM  . ferric gluconate (FERRLECIT/NULECIT) IV  125 mg Intravenous Q M,W,F-HD  . insulin aspart  0-5 Units Subcutaneous QHS  . insulin aspart  0-9 Units Subcutaneous TID WC  . insulin glargine  5 Units Subcutaneous Daily  . insulin glargine  7 Units Subcutaneous QHS  . levothyroxine  168 mcg Oral QAC breakfast  . metoCLOPramide (REGLAN) injection  5 mg Intravenous Q8H  . metoprolol  75 mg Oral BID  . pantoprazole  40 mg Oral Daily  . sevelamer carbonate  800 mg Oral TID WC  . simvastatin  5 mg Oral q1800  . sodium chloride  3 mL Intravenous Q12H  . sodium chloride  3 mL Intravenous Q12H  . temazepam  15 mg Oral Custom  . Warfarin - Pharmacist Dosing Inpatient   Does not apply q1800   Continuous Infusions: . heparin 1,650 Units/hr (05/23/13 1610)    Principal Problem:   Arm DVT (deep venous thromboembolism), acute Active Problems:   Diabetes mellitus   Hypoglycemia due to insulin   Acute DVT (deep venous thrombosis)    Time spent: 35 min    Marcus [name]  [facility] Pager [phone]. If 7PM-7AM, please contact night-coverage at [email or web address], password [facility] 05/23/2013, 10:19 AM  LOS: 4 days

## 2013-05-23 NOTE — Progress Notes (Signed)
Physical Therapy Treatment Patient Details Name: Marcus [name] MRN: [id number] DOB: Mar 29, 1956 Today's Date: 05/23/2013 Time: [id number] PT Time Calculation (min): 27 min  PT Assessment / Plan / Recommendation  History of Present Illness Marcus [name]  is a 58 y.o. male with a history listed above but significant for multiple DVTs, PEs, CVAs and ESRD who dialyzes normally on M/W/F.  Marcus [name] presented [facility] with left upper extremity pain. [facility] showed a left distal subclavian DVT.  Marcus [name] reports that he hasn't taken his coumadin for approximately 1 month because he could not get an order for it   PT Comments   Patient able to negotiate stairs safely with little assist.  Feel activity tolerance likely close to his baseline.  Will benefit from HHPT for safety and general strength/balance.  Follow Up Recommendations  Home health PT;Supervision - Intermittent     Does the patient have the potential to tolerate intense rehabilitation   N/A  Barriers to Discharge  None      Equipment Recommendations  None recommended by PT    Recommendations for Other Services  None  Frequency Min 3X/week   Progress towards PT Goals Progress towards PT goals: Progressing toward goals  Plan Current plan remains appropriate    Precautions / Restrictions Precautions Precautions: Fall   Pertinent Vitals/Pain No pain complaints    Mobility  Bed Mobility Overal bed mobility: Modified Independent Transfers Equipment used: Rolling walker (2 wheeled) Transfers: Sit to/from Stand Sit to Stand: Modified independent (Device/Increase time) Ambulation/Gait Ambulation/Gait assistance: Min guard;Supervision Ambulation Distance (Feet): 200 Feet (and 130') Assistive device: Rolling walker (2 wheeled) Gait Pattern/deviations: Step-through pattern;Ataxic;Steppage General Gait Details: Ataxia noted with occasional lifting of one edge of walker Stairs: Yes Stairs  assistance: Min guard Stair Management: Step to pattern;Sideways;One rail Right Number of Stairs: 5 General stair comments: slow and cautious with heavy UE assist on rail      PT Goals (current goals can now be found in the care plan section)    Visit Information  Last PT Received On: 05/23/13 Assistance Needed: +1 History of Present Illness: Marcus [name]  is a 58 y.o. male with a history listed above but significant for multiple DVTs, PEs, CVAs and ESRD who dialyzes normally on M/W/F.  Marcus [name] presented to the [facility] ER with left upper extremity pain. [facility] showed a left distal subclavian DVT.  Marcus [name] reports that he hasn't taken his coumadin for approximately 1 month because he could not get an order for it    Subjective Data      Cognition  Cognition Arousal/Alertness: Awake/alert Behavior During Therapy: WFL for tasks assessed/performed Overall Cognitive Status: Within Functional Limits for tasks assessed    Balance  Balance Overall balance assessment: Needs assistance Standing balance support: Bilateral upper extremity supported Standing balance-Leahy Scale: Poor Standing balance comment: ataxia evident  End of Session PT - End of Session Equipment Utilized During Treatment: Gait belt Activity Tolerance: Patient tolerated treatment well Patient left: in bed;with call bell/phone within reach   GP     [facility] LLCWYNN,CYNDI 05/23/2013, 5:06 PM Kalaeloayndi Olanrewaju [place], [place]PT [phone] 05/23/2013

## 2013-05-23 NOTE — Progress Notes (Signed)
Subjective:  On hd no cos Objective Vital signs in last 24 hours: Filed Vitals:   05/22/13 2028 05/23/13 0442 05/23/13 0807 05/23/13 0816  BP: 153/74 166/86 147/84 172/92  Pulse: 76 69 70 69  Temp: 98.3 F (36.8 C) 98.6 F (37 C) 97.7 F (36.5 C)   TempSrc:  Oral Oral   Resp: 17 18 18 20   Height:      Weight: 63 kg (138 lb 14.2 oz)  63.9 kg (140 lb 14 oz)   SpO2: 97% 98% 100%    Weight change: 4 kg (8 lb 13.1 oz)  Labs: Basic Metabolic Panel:  Recent Labs Lab 05/20/13 2004 05/21/13 0548 05/23/13 0450  NA 129* 129* 131*  K 4.3 4.7 4.4  CL 90* 91* 91*  CO2 23 22 23   GLUCOSE 222* 313* 126*  BUN 25* 35* 46*  CREATININE 4.31* 5.04* 6.22*  CALCIUM 8.5 8.5 8.9  PHOS 4.6 5.2* 4.9*   Liver Function Tests:  Recent Labs Lab 05/20/13 2004 05/21/13 0548 05/23/13 0450  ALBUMIN 2.9* 2.7* 2.9*   CBC:  Recent Labs Lab 05/19/13 1335 05/21/13 0548 05/22/13 0436 05/23/13 0450  WBC 6.1 5.0 6.6 8.2  HGB 12.3* 10.3* 9.7* 9.3*  HCT 37.2* 31.3* 30.8* 29.2*  MCV 90.7 89.7 91.4 90.7  PLT 196 175 188 202  CBG:  Recent Labs Lab 05/22/13 0734 05/22/13 1135 05/22/13 1649 05/22/13 2027 05/23/13 0739  GLUCAP 76 182* 366* 333* 104*   Medications: . heparin 1,650 Units/hr (05/23/13 0620)   . amLODipine  10 mg Oral QHS  . cloNIDine  0.1 mg Oral TID  . darbepoetin (ARANESP) injection - DIALYSIS  60 mcg Intravenous Q Fri-HD  . feeding supplement (NEPRO CARB STEADY)  237 mL Oral BID BM  . ferric gluconate (FERRLECIT/NULECIT) IV  125 mg Intravenous Q M,W,F-HD  . insulin aspart  0-5 Units Subcutaneous QHS  . insulin aspart  0-9 Units Subcutaneous TID WC  . insulin glargine  5 Units Subcutaneous Daily  . insulin glargine  7 Units Subcutaneous QHS  . levothyroxine  168 mcg Oral QAC breakfast  . metoCLOPramide (REGLAN) injection  5 mg Intravenous Q8H  . metoprolol  75 mg Oral BID  . pantoprazole  40 mg Oral Daily  . sevelamer carbonate  800 mg Oral TID WC  . simvastatin  5  mg Oral q1800  . sodium chloride  3 mL Intravenous Q12H  . sodium chloride  3 mL Intravenous Q12H  . temazepam  15 mg Oral Custom  . Warfarin - Pharmacist Dosing Inpatient   Does not apply q1800    Physical Exam: General: alert nad on hd Heart: RRR no rub or mur Lungs: CTA  Abdomen: soft ,nt, nd Extremities: Dialysis Access: No pedal edema/  R Fem Perm cath   Dialysis Orders: MWF [facility]  4:15, 58 kg 2K/2.5Ca 450/A1.5 Heparin 0 R femoral catheter  Hectorol 0 Epogen 0 Venofer 100 mg x 10 (2/2 - 23)   Assessment/Plan:  1. 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 Hypoglycemia - per primary. 3. Hyperkalemia - Admit  K > 7.7=resolved with HD, 4.4 pre-HD today 4. ESRD - HD on MWF @ [facility]. 5. Hypertension/volume - BP up pre hd= 172/92 on outpatient Amlodipine 10 mg qd, Metoprolol 75 mg  bid except pre-HD; wt 63.9kg BEDSCALE  Pre hd today with EDW 58  obtaind STANDING WTSNOTED OT signed off still needs PT "walked with walker at home" per PT. 6. Anemia - Hgb down to 9.3, no outpatient Epogen, but started 60mcg  Aranesp  In hosp. And venofer series (x 10) 2/2. 7. Metabolic bone disease - Ca 8.9 (10.0corrected), P 4.9 iPTH 459; no Hectorol, phoslo 2 with meals. 8. Nutrition - Alb 2.7>2.9, renal diet & vitamin. 9. Hx infected access - R thigh AVG removed 12/31, now with R femoral catheter,VVS plans to move catheter to IJ and place new R thigh AVG 2/27 per VVS. 10. Hx thyroid goiter - s/p neck exploration & lymph node biopsy (benign) per Dr. Gerrit FriendsGerkin 01/28/13, but may require thyroidectomy involving median sternotomy; on Synthroid.  Lenny Pastelavid Dennisse Swader, PA-C [facility] Beeper [phone] 05/23/2013,8:24 AM  LOS: 4 days

## 2013-05-23 NOTE — Progress Notes (Signed)
ANTICOAGULATION CONSULT NOTE - Follow Up Consult  Pharmacy Consult for Heparin/Warfarin  Indication: DVT  Allergies  Allergen Reactions  . Penicillins Other (See Comments)    Unknown reaction    Patient Measurements: Height: 6\' 2"  (188 cm) Weight: 138 lb 14.2 oz (63 kg) IBW/kg (Calculated) : 82.2  Vital Signs: Temp: 98.6 F (37 C) (02/06 0442) Temp src: Oral (02/06 0442) BP: 166/86 mmHg (02/06 0442) Pulse Rate: 69 (02/06 0442)  Labs:  Recent Labs  05/20/13 2004  05/21/13 0548  05/22/13 0436 05/22/13 1347 05/23/13 0450  HGB  --   < > 10.3*  --  9.7*  --  9.3*  HCT  --   --  31.3*  --  30.8*  --  29.2*  PLT  --   --  175  --  188  --  202  LABPROT  --   --  13.7  --  15.2  --  17.1*  INR  --   --  1.07  --  1.23  --  1.43  HEPARINUNFRC  --   --  0.25*  < > 0.14* 0.35 0.24*  CREATININE 4.31*  --  5.04*  --   --   --  6.22*  < > = values in this interval not displayed.  Estimated Creatinine Clearance: 11.7 ml/min (by C-G formula based on Cr of 6.22).   Medications:  Heparin 1500 units/hr  Assessment: 58 y/o M on heparin for new DVT, HL is 0.24, other labs as above, noted HD patient.   Goal of Therapy:  Heparin level 0.3-0.7 units/ml Monitor platelets by anticoagulation protocol: Yes   Plan:  -Increase heparin drip to 1650 units/hr -1400 HL (ensure this is drawn at least 2 hours after HD) -Daily CBC/HL -Monitor for bleeding -Warfarin per previous note  Abran DukeLedford, Analea Muller 05/23/2013,6:04 AM

## 2013-05-23 NOTE — Progress Notes (Signed)
ANTICOAGULATION CONSULT NOTE - Follow Up Consult  Pharmacy Consult for Heparin/Warfarin  Indication: DVT  Allergies  Allergen Reactions  . Penicillins Other (See Comments)    Unknown reaction    Patient Measurements: Height: 6\' 2"  (188 cm) Weight: 134 lb 4.2 oz (60.9 kg) IBW/kg (Calculated) : 82.2  Vital Signs: Temp: 98.3 F (36.8 C) (02/06 2013) Temp src: Oral (02/06 2013) BP: 157/79 mmHg (02/06 2013) Pulse Rate: 70 (02/06 2013)  Labs:  Recent Labs  05/21/13 0548  05/22/13 0436  05/23/13 0450 05/23/13 1500 05/23/13 2300  HGB 10.3*  --  9.7*  --  9.3*  --   --   HCT 31.3*  --  30.8*  --  29.2*  --   --   PLT 175  --  188  --  202  --   --   LABPROT 13.7  --  15.2  --  17.1*  --   --   INR 1.07  --  1.23  --  1.43  --   --   HEPARINUNFRC 0.25*  < > 0.14*  < > 0.24* 0.76* 0.39  CREATININE 5.04*  --   --   --  6.22*  --   --   < > = values in this interval not displayed.  Estimated Creatinine Clearance: 11.3 ml/min (by C-G formula based on Cr of 6.22).  Medications:  Heparin 1550 units/hr  Assessment: 58 y/o M on heparin for new DVT, HL is 0.39, other labs as above, noted HD patient.   Goal of Therapy:  Heparin level 0.3-0.7 units/ml Monitor platelets by anticoagulation protocol: Yes   Plan:  -Continue heparin at 1550 units/hr -HL with AM labs -Daily CBC/HL -Monitor for bleeding -Warfarin per previous note  Abran DukeLedford, Ludell Zacarias 05/23/2013,11:43 PM

## 2013-05-23 NOTE — Progress Notes (Signed)
Inpatient Diabetes Program Recommendations  AACE/ADA: New Consensus Statement on Inpatient Glycemic Control (2013)  Target Ranges:  Prepandial:   less than 140 mg/dL      Peak postprandial:   less than 180 mg/dL (1-2 hours)      Critically ill patients:  140 - 180 mg/dL   Results for Marcus JacksonSTALEY, Danil (MRN [id number]) as of 05/23/2013 10:14  Ref. Range 05/22/2013 07:34 05/22/2013 11:35 05/22/2013 16:49 05/22/2013 20:27 05/23/2013 07:39  Glucose-Capillary Latest Range: 70-99 mg/dL 76 914182 (H) 782366 (H) 956333 (H) 104 (H)   Diabetes history: DM 2 Outpatient Diabetes medications: Lantus 8 units BID, Novolog 2 units with breakfast, Novolog 3 units with lunch, and Novolog 3 units with supper Current orders for Inpatient glycemic control: Lantus 5 units QAM, Lantus 8 units QHS, Novolog 0-9 units AC, and Novolog 0-5 units QHS  Inpatient Diabetes Program Recommendations Insulin - Meal Coverage: Noted postprandial glucose consistently elevated.  Please consider ordering Novolog 3 units TID with meals for meal coverage if patient eats at least 50% of meals.  Thanks, Orlando PennerMarie Desteny Freeman, RN, MSN, CCRN Diabetes Coordinator Inpatient Diabetes Program [phone] (Team Pager) [phone] (AP office) [phone] [facility] office)

## 2013-05-23 NOTE — Progress Notes (Signed)
I have seen and examined this patient and agree with the plan of care . Patient stable in dialysis  [facility],Jamere Stidham W 05/23/2013, 9:28 AM

## 2013-05-23 NOTE — Progress Notes (Signed)
ANTICOAGULATION CONSULT NOTE - Follow up Consult  Pharmacy Consult for Heparin Indication: New LUE DVT  Allergies  Allergen Reactions  . Penicillins Other (See Comments)    Unknown reaction    Patient Measurements: Height: 6\' 2"  (188 cm) Weight: 134 lb 4.2 oz (60.9 kg) IBW/kg (Calculated) : 82.2 Heparin Dosing Weight: 60.9kg  Vital Signs: Temp: 98 F (36.7 C) (02/06 1414) Temp src: Oral (02/06 1414) BP: 157/85 mmHg (02/06 1414) Pulse Rate: 76 (02/06 1414)  Labs:  Recent Labs  05/20/13 2004  05/21/13 0548  05/22/13 0436 05/22/13 1347 05/23/13 0450 05/23/13 1500  HGB  --   < > 10.3*  --  9.7*  --  9.3*  --   HCT  --   --  31.3*  --  30.8*  --  29.2*  --   PLT  --   --  175  --  188  --  202  --   LABPROT  --   --  13.7  --  15.2  --  17.1*  --   INR  --   --  1.07  --  1.23  --  1.43  --   HEPARINUNFRC  --   --  0.25*  < > 0.14* 0.35 0.24* 0.76*  CREATININE 4.31*  --  5.04*  --   --   --  6.22*  --   < > = values in this interval not displayed.  Estimated Creatinine Clearance: 11.3 ml/min (by C-G formula based on Cr of 6.22).   Assessment: Marcus [name] with h/o of multiple DVTs and PEs [facility] with new DVT. On Coumadin with heparin bridge for DVT till INR > 2. HL this morning was sub-therapeutic at 0.24 and heparin was increased to 1650 units/hr. Now with elevated heparin level at 0.76- note this was drawn >2 hours after HD which allows adequate re-equilibration. CBC stable and no bleeding noted.  Goal of Therapy:  Heparin level 0.3-0.7 units/ml Monitor platelets by anticoagulation protocol: Yes   Plan:  1. Reduce heparin rate to 1550 units/hr 2. Heparin level at 2300 3. Daily heparin level and CBC 4. Follow for s/s bleeding  Jakhiya Brower D. Kensly Bowmer, PharmD, BCPS Clinical Pharmacist Pager: [phone] 05/23/2013 4:39 PM

## 2013-05-23 NOTE — Progress Notes (Signed)
ANTICOAGULATION CONSULT NOTE - Follow up Consult  Pharmacy Consult for Heparin +Coumadin  Indication: New LUE DVT  Allergies  Allergen Reactions  . Penicillins Other (See Comments)    Unknown reaction    Patient Measurements: Height: 6\' 2"  (188 cm) Weight: 140 lb 14 oz (63.9 kg) IBW/kg (Calculated) : 82.2 Heparin Dosing Weight: n/a   Vital Signs: Temp: 97.7 F (36.5 C) (02/06 0807) Temp src: Oral (02/06 0807) BP: 168/89 mmHg (02/06 1000) Pulse Rate: 77 (02/06 1000)  Labs:  Recent Labs  05/20/13 2004  05/21/13 0548  05/22/13 0436 05/22/13 1347 05/23/13 0450  HGB  --   < > 10.3*  --  9.7*  --  9.3*  HCT  --   --  31.3*  --  30.8*  --  29.2*  PLT  --   --  175  --  188  --  202  LABPROT  --   --  13.7  --  15.2  --  17.1*  INR  --   --  1.07  --  1.23  --  1.43  HEPARINUNFRC  --   --  0.25*  < > 0.14* 0.35 0.24*  CREATININE 4.31*  --  5.04*  --   --   --  6.22*  < > = values in this interval not displayed.  Estimated Creatinine Clearance: 11.8 ml/min (by C-G formula based on Cr of 6.22).   Assessment: [age] YOM with h/o of multiple DVTs, PEs [facility] with left upper extremity pain. [facility] showed left distal subclavian DVT. Pt reports he has not taken his coumadin in approximately 1 month b/c he could not get an order for it. It seems patient is not compliant with his meds at home. He is now on Coumadin with heparin bridge for DVT till INR > 2. INR has trended up to 1.43 after 4 doses of coumadin. He also seems to be eating ~90% of his meals. H/H continues to trend down to 9.3/29.2. Plt wnl. No unusual s/s of bleeding noted.   HL this AM was sub-therapeutic at 0.24 Heparin was increased to 1650 units/hr.  Goal of Therapy:  Heparin level 0.3-0.7 units/ml Monitor platelets by anticoagulation protocol: Yes   Plan:  Coumadin 12.5 mg x 1 dose tonight  F/u 1530 HL and adjust heparin infusion accordingly.  Continue to monitor for s/s of  bleeding.  Daily INR and CBC  Vinnie LevelBenjamin Niccolas Loeper, PharmD.  Clinical Pharmacist Pager [phone]

## 2013-05-24 DIAGNOSIS — E43 Unspecified severe protein-calorie malnutrition: Secondary | ICD-10-CM

## 2013-05-24 LAB — PROTIME-INR
INR: 1.66 — AB (ref 0.00–1.49)
Prothrombin Time: 19.1 seconds — ABNORMAL HIGH (ref 11.6–15.2)

## 2013-05-24 LAB — CBC
HEMATOCRIT: 28.7 % — AB (ref 39.0–52.0)
HEMOGLOBIN: 9.3 g/dL — AB (ref 13.0–17.0)
MCH: 29.3 pg (ref 26.0–34.0)
MCHC: 32.4 g/dL (ref 30.0–36.0)
MCV: 90.5 fL (ref 78.0–100.0)
Platelets: 183 10*3/uL (ref 150–400)
RBC: 3.17 MIL/uL — AB (ref 4.22–5.81)
RDW: 15.8 % — ABNORMAL HIGH (ref 11.5–15.5)
WBC: 5.9 10*3/uL (ref 4.0–10.5)

## 2013-05-24 LAB — GLUCOSE, CAPILLARY
GLUCOSE-CAPILLARY: 567 mg/dL — AB (ref 70–99)
Glucose-Capillary: 137 mg/dL — ABNORMAL HIGH (ref 70–99)
Glucose-Capillary: 144 mg/dL — ABNORMAL HIGH (ref 70–99)
Glucose-Capillary: 35 mg/dL — CL (ref 70–99)
Glucose-Capillary: 46 mg/dL — ABNORMAL LOW (ref 70–99)
Glucose-Capillary: 485 mg/dL — ABNORMAL HIGH (ref 70–99)
Glucose-Capillary: 511 mg/dL — ABNORMAL HIGH (ref 70–99)
Glucose-Capillary: 74 mg/dL (ref 70–99)

## 2013-05-24 LAB — HEPARIN LEVEL (UNFRACTIONATED): Heparin Unfractionated: 0.37 IU/mL (ref 0.30–0.70)

## 2013-05-24 MED ORDER — WARFARIN SODIUM 10 MG PO TABS
10.0000 mg | ORAL_TABLET | Freq: Once | ORAL | Status: AC
  Administered 2013-05-24: 10 mg via ORAL
  Filled 2013-05-24: qty 1

## 2013-05-24 MED ORDER — INSULIN GLARGINE 100 UNIT/ML ~~LOC~~ SOLN
9.0000 [IU] | Freq: Every day | SUBCUTANEOUS | Status: DC
  Filled 2013-05-24: qty 0.09

## 2013-05-24 MED ORDER — INSULIN GLARGINE 100 UNIT/ML ~~LOC~~ SOLN
9.0000 [IU] | Freq: Two times a day (BID) | SUBCUTANEOUS | Status: DC
  Filled 2013-05-24 (×3): qty 0.09

## 2013-05-24 MED ORDER — INSULIN ASPART 100 UNIT/ML ~~LOC~~ SOLN
5.0000 [IU] | Freq: Three times a day (TID) | SUBCUTANEOUS | Status: DC
  Administered 2013-05-24 (×2): 5 [IU] via SUBCUTANEOUS

## 2013-05-24 MED ORDER — DEXTROSE 50 % IV SOLN
INTRAVENOUS | Status: AC
  Administered 2013-05-24 (×2)
  Filled 2013-05-24: qty 50

## 2013-05-24 NOTE — Progress Notes (Signed)
ANTICOAGULATION CONSULT NOTE - Follow Up Consult  Pharmacy Consult for Heparin/Warfarin  Indication: DVTs  Allergies  Allergen Reactions  . Penicillins Other (See Comments)    Unknown reaction    Patient Measurements: Height: 6\' 2"  (188 cm) Weight: 134 lb 4.2 oz (60.9 kg) IBW/kg (Calculated) : 82.2  Vital Signs: Temp: 98.9 F (37.2 C) (02/07 0752) Temp src: Oral (02/07 0752) BP: 158/86 mmHg (02/07 0752) Pulse Rate: 78 (02/07 0752)  Labs:  Recent Labs  05/22/13 0436  05/23/13 0450 05/23/13 1500 05/23/13 2300 05/24/13 0445  HGB 9.7*  --  9.3*  --   --  9.3*  HCT 30.8*  --  29.2*  --   --  28.7*  PLT 188  --  202  --   --  183  LABPROT 15.2  --  17.1*  --   --  19.1*  INR 1.23  --  1.43  --   --  1.66*  HEPARINUNFRC 0.14*  < > 0.24* 0.76* 0.39 0.37  CREATININE  --   --  6.22*  --   --   --   < > = values in this interval not displayed.  Estimated Creatinine Clearance: 11.3 ml/min (by C-G formula based on Cr of 6.22).  Medications:  Heparin 1550 units/hr  Assessment: 58 [facility] M [facility] w/ new L distal subclavian DVT. Patient has prior history of multiple DVTS/PE on chronic coumadin at home (PTA takes 7.5mg  PO Daily, however has been off for ~1 mo). Currently on coumadin w/ heparin bridge on day 5. INR is subtherapeutic, however has appropriately been trending up. HL is therapeutic, CBC stable, and on HD. No unusual s/s of bleeding noted.  Goal of Therapy:  Heparin level 0.3-0.7 units/ml Monitor platelets by anticoagulation protocol: Yes   Plan:  Coumadin 10mg  x1 tonight Continue Heparin 1550 units/hr Check HL/CBC daily Monitor for signs of bleed  Emberlee Sortino M. Allena KatzPatel, PharmD Clinical Pharmacist- Resident Pager: [phone] Pharmacy: [phone] 05/24/2013 9:01 AM

## 2013-05-24 NOTE — Progress Notes (Signed)
Subjective:  Eating breakfast no cos/tolerated hd Objective Vital signs in last 24 hours: Filed Vitals:   05/23/13 1758 05/23/13 2013 05/24/13 0434 05/24/13 0752  BP: 181/78 157/79 165/86 158/86  Pulse: 68 70 68 78  Temp: 98.2 F (36.8 C) 98.3 F (36.8 C) 97.8 F (36.6 C) 98.9 F (37.2 C)  TempSrc: Oral Oral Oral Oral  Resp: 20 20 18 19   Height:      Weight:      SpO2: 100% 100% 99% 96%   Weight change: 0.9 kg (1 lb 15.7 oz)  Labs: Basic Metabolic Panel:  Recent Labs Lab 05/20/13 2004 05/21/13 0548 05/23/13 0450  NA 129* 129* 131*  K 4.3 4.7 4.4  CL 90* 91* 91*  CO2 23 22 23   GLUCOSE 222* 313* 126*  BUN 25* 35* 46*  CREATININE 4.31* 5.04* 6.22*  CALCIUM 8.5 8.5 8.9  PHOS 4.6 5.2* 4.9*    Recent Labs Lab 05/20/13 2004 05/21/13 0548 05/23/13 0450  ALBUMIN 2.9* 2.7* 2.9*   CBC:  Recent Labs Lab 05/19/13 1335 05/21/13 0548 05/22/13 0436 05/23/13 0450 05/24/13 0445  WBC 6.1 5.0 6.6 8.2 5.9  HGB 12.3* 10.3* 9.7* 9.3* 9.3*  HCT 37.2* 31.3* 30.8* 29.2* 28.7*  MCV 90.7 89.7 91.4 90.7 90.5  PLT 196 175 188 202 183   :CBG: Recent Labs Lab 05/23/13 0739 05/23/13 1242 05/23/13 1633 05/23/13 2018 05/23/13 2236  GLUCAP 104* 75 348* 202* 215*     Medications: . heparin 1,550 Units/hr (05/24/13 0650)   . amLODipine  10 mg Oral QHS  . cloNIDine  0.1 mg Oral TID  . darbepoetin (ARANESP) injection - DIALYSIS  60 mcg Intravenous Q Fri-HD  . feeding supplement (NEPRO CARB STEADY)  237 mL Oral BID BM  . ferric gluconate (FERRLECIT/NULECIT) IV  125 mg Intravenous Q M,W,F-HD  . insulin aspart  0-5 Units Subcutaneous QHS  . insulin aspart  0-9 Units Subcutaneous TID WC  . insulin glargine  7 Units Subcutaneous QHS  . insulin glargine  7 Units Subcutaneous Daily  . levothyroxine  168 mcg Oral QAC breakfast  . metoCLOPramide (REGLAN) injection  5 mg Intravenous Q8H  . metoprolol  75 mg Oral BID  . pantoprazole  40 mg Oral Daily  . sevelamer carbonate  800  mg Oral TID WC  . simvastatin  5 mg Oral q1800  . sodium chloride  3 mL Intravenous Q12H  . sodium chloride  3 mL Intravenous Q12H  . temazepam  15 mg Oral Custom  . Warfarin - Pharmacist Dosing Inpatient   Does not apply q1800    Physical Exam:  General: alert nad Heart: RRR no rub or mur  Lungs: CTA  Abdomen: soft ,nt, nd  Extremities: Dialysis Access: No pedal edema/  R arm swelling resolving/ R Fem Perm cath   Dialysis Orders: [facility]WF [facility]  4:15, 58 kg 2K/2.5Ca 450/A1.5 Heparin 0 R femoral catheter  Hectorol 0 Epogen 0 Venofer 100 mg x 10 (2/2 - 23)   Assessment/Plan:  1. 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 Hypoglycemia - per primary.resolved now 3. Hyperkalemia - Admit K > 7.7=resolved with HD, 4.4 pre-Hd yesterday 4. ESRD - HD on MWF [facility] Hypertension/volume - BP up pre hd= 158/86 on outpatient Amlodipine 10 mg qd, Metoprolol  75 mg bid except pre-HD; / hd yesterday post wt 60.9kg/   With 3500 uf/ EDW 58  Needing STANDING WTS . 5. Anemia - Hgb down to 9.3, no outpatient Epogen, but started Aranesp In hosp. And venofer series (x 10) 2/2. 6. Metabolic bone disease - Ca 8.9 (10.0corrected), P 4.9, iPTH 459; no Hectorol, phoslo 2 with meals. 7. Nutrition - Alb 2.7>2.9, renal diet & vitamin. 8. Hx infected access - R thigh AVG removed 12/31, now with R femoral catheter,VVS plans to move catheter to IJ and place new R thigh AVG 2/27 per VVS. 9. Hx thyroid goiter - s/p neck exploration & lymph node biopsy (benign) per Dr. Gerrit Friends 01/28/13, but may require thyroidectomy involving median sternotomy; on Synthroid.  Lenny Pastel, PA-C [facility] Beeper [phone] 05/24/2013,8:25 AM  LOS: 5 days

## 2013-05-24 NOTE — Progress Notes (Signed)
I have seen and examined this patient and agree with the plan of care  [facility] InstituteWEBB,Beverly Suriano W 05/24/2013, 10:45 AM

## 2013-05-24 NOTE — Progress Notes (Signed)
[facility] PROGRESS NOTE  Marcus JacksonCharles [name] NWG:[id number] DOB: [birth year] DOA: 05/19/2013 PCP: Galvin ProfferHAGUE, [name], MD  Marcus JacksonCharles Bellevue is a 58 y.o. male with a history listed above but significant for multiple DVTs, PEs, CVAs and ESRD who dialyzes normally on M/W/F. Marcus [name] presented [facility] ER with left upper extremity pain. [facility] left distal subclavian DVT. Marcus [name] reports that he hasn't taken his coumadin for approximately 1 month because he could not get an order for it. He has been compliant with his other medications including insulin. He reportedly has been to the ER 5x last week with altered mental status due to hypoglycemia. Further Marcus [name] has recurrent problems with dialysis access. He had an infected graft removed from his left groin on 12/31. On exam this area is still tender to palpation and mildly indurated. He is currently using a right femoral catheter for HD access  Assessment/Plan: Acute Left Upper Extremity DVT  Heparin Drip per pharmacy  Coumadin per pharmacy  Goal 2-3  Recurrent Hypoglycemia in Type 1 DM  Added back lantus at higher dose Placed on SSI and given novolog with meals   Nausea -add reglan -improved  Severe malnutrition -dietician  ESRD  Appreciate renal consultation  Scheduled dialysis is M/W/F in [place].   Hyperkalemia  - Will not treat with Kayexalate, resolved  -dialysis   uncontrolled hypertension  - Resume home antihypertensives-amlodipine, clonidine and metoprolol   Recent history of infected graft in left groin  Does not appear acutely infected, no drainage.  Will ask VVS for their evaluation and recommendations regarding his previous left groin infection as well as current HD access.   Hypothyroidism  Recent history of elevated TSH.  Synthroid dose was adjusted 12/31.  -does not appear to have been taking medications   Code Status: full Family Communication: patient Disposition  Plan: PT eval   Consultants:  Vascular  renal  Procedures:    Antibiotics:    HPI/Subjective: Eating well  Objective: Filed Vitals:   05/24/13 1147  BP: 169/79  Pulse: 69  Temp: 98.6 F (37 C)  Resp: 18    Intake/Output Summary (Last 24 hours) at 05/24/13 1149 Last data filed at 05/24/13 0657  Gross per 24 hour  Intake 1565.5 ml  Output   3000 ml  Net -1434.5 ml   Filed Weights   05/22/13 2028 05/23/13 0807 05/23/13 1216  Weight: 63 kg (138 lb 14.2 oz) 63.9 kg (140 lb 14 oz) 60.9 kg (134 lb 4.2 oz)    Exam:   General:  Pleasant/cooperative  Cardiovascular: rrr  Respiratory: clear anterior  Abdomen: +BS, soft  Musculoskeletal: wound on left leg   Data Reviewed: Basic Metabolic Panel:  Recent Labs Lab 05/19/13 1951 05/20/13 0700 05/20/13 2004 05/21/13 0548 05/23/13 0450  NA 125* 123* 129* 129* 131*  K >7.7* 6.4* 4.3 4.7 4.4  CL 86* 83* 90* 91* 91*  CO2 19 15* 23 22 23   GLUCOSE 382* 571* 222* 313* 126*  BUN 70* 48* 25* 35* 46*  CREATININE 9.11* 6.82* 4.31* 5.04* 6.22*  CALCIUM 8.0* 8.5 8.5 8.5 8.9  PHOS 6.6* 6.1* 4.6 5.2* 4.9*   Liver Function Tests:  Recent Labs Lab 05/19/13 1951 05/20/13 0700 05/20/13 2004 05/21/13 0548 05/23/13 0450  ALBUMIN 2.9* 3.1* 2.9* 2.7* 2.9*   No results found for this basename: LIPASE, AMYLASE,  in the last 168 hours No results found for this basename: AMMONIA,  in the last 168 hours CBC:  Recent Labs Lab 05/19/13 1335 05/21/13 0548 05/22/13 0436 05/23/13 0450 05/24/13 0445  WBC 6.1 5.0 6.6 8.2 5.9  HGB 12.3* 10.3* 9.7* 9.3* 9.3*  HCT 37.2* 31.3* 30.8* 29.2* 28.7*  MCV 90.7 89.7 91.4 90.7 90.5  PLT 196 175 188 202 183   Cardiac Enzymes: No results found for this basename: CKTOTAL, CKMB, CKMBINDEX, TROPONINI,  in the last 168 hours BNP (last 3 results) No results found for this basename: PROBNP,  in the last 8760 hours CBG:  Recent Labs Lab 05/23/13 0739 05/23/13 1242  05/23/13 1633 05/23/13 2018 05/23/13 2236  GLUCAP 104* 75 348* 202* 215*    No results found for this or any previous visit (from the past 240 hour(s)).   Studies: US Soft Tissue Head/neck  05/23/2013   CLINICAL DATA:  Neck fullness. Thyromegaly on previous neck CT. History of thyroidectomy.  EXAM: THYROID ULTRASOUND  TECHNIQUE: Ultrasound examination of the thyroid gland and adjacent soft tissues was performed.  COMPARISON:  CT 05/13/2013  FINDINGS: Right thyroid lobe  Measurements: 86 x 46 x 31 mm. Heterogeneous echotexture with hyperemia on color Doppler.  Left thyroid lobe  Measurements: 113 x 49 x 29 mm. Hyperemic; heterogeneous echotexture  Isthmus  Not discretely demarcated  Lymphadenopathy  None visualized.  IMPRESSION: Marked thyromegaly without dominant nodule or lesion.   Electronically Signed   By: Oley Balm M.D.   On: 05/23/2013 09:10    Scheduled Meds: . amLODipine  10 mg Oral QHS  . cloNIDine  0.1 mg Oral TID  . darbepoetin (ARANESP) injection - DIALYSIS  60 mcg Intravenous Q Fri-HD  . feeding supplement (NEPRO CARB STEADY)  237 mL Oral BID BM  . ferric gluconate (FERRLECIT/NULECIT) IV  125 mg Intravenous Q M,W,F-HD  . insulin aspart  0-5 Units Subcutaneous QHS  . insulin aspart  0-9 Units Subcutaneous TID WC  . insulin aspart  5 Units Subcutaneous TID WC  . insulin glargine  9 Units Subcutaneous QHS  . [START ON 05/25/2013] insulin glargine  9 Units Subcutaneous Daily  . levothyroxine  168 mcg Oral QAC breakfast  . metoCLOPramide (REGLAN) injection  5 mg Intravenous Q8H  . metoprolol  75 mg Oral BID  . pantoprazole  40 mg Oral Daily  . sevelamer carbonate  800 mg Oral TID WC  . simvastatin  5 mg Oral q1800  . sodium chloride  3 mL Intravenous Q12H  . sodium chloride  3 mL Intravenous Q12H  . temazepam  15 mg Oral Custom  . warfarin  10 mg Oral ONCE-1800  . Warfarin - Pharmacist Dosing Inpatient   Does not apply q1800   Continuous Infusions: . heparin 1,550  Units/hr (05/24/13 0650)    Principal Problem:   Arm DVT (deep venous thromboembolism), acute Active Problems:   Diabetes mellitus   Hypoglycemia due to insulin   Acute DVT (deep venous thrombosis)    Time spent: 35 min    Marcus [name]  [facility] Pager [phone]. If 7PM-7AM, please contact night-coverage at [email or web address], password [facility] 05/24/2013, 11:49 AM  LOS: 5 days

## 2013-05-25 LAB — CBC
HEMATOCRIT: 28.5 % — AB (ref 39.0–52.0)
HEMOGLOBIN: 9.3 g/dL — AB (ref 13.0–17.0)
MCH: 29.2 pg (ref 26.0–34.0)
MCHC: 32.6 g/dL (ref 30.0–36.0)
MCV: 89.3 fL (ref 78.0–100.0)
Platelets: 234 10*3/uL (ref 150–400)
RBC: 3.19 MIL/uL — ABNORMAL LOW (ref 4.22–5.81)
RDW: 15.7 % — ABNORMAL HIGH (ref 11.5–15.5)
WBC: 5.4 10*3/uL (ref 4.0–10.5)

## 2013-05-25 LAB — GLUCOSE, CAPILLARY
GLUCOSE-CAPILLARY: 112 mg/dL — AB (ref 70–99)
GLUCOSE-CAPILLARY: 149 mg/dL — AB (ref 70–99)
GLUCOSE-CAPILLARY: 159 mg/dL — AB (ref 70–99)
GLUCOSE-CAPILLARY: 305 mg/dL — AB (ref 70–99)
Glucose-Capillary: 130 mg/dL — ABNORMAL HIGH (ref 70–99)
Glucose-Capillary: 161 mg/dL — ABNORMAL HIGH (ref 70–99)
Glucose-Capillary: 50 mg/dL — ABNORMAL LOW (ref 70–99)
Glucose-Capillary: 56 mg/dL — ABNORMAL LOW (ref 70–99)

## 2013-05-25 LAB — PROTIME-INR
INR: 1.37 (ref 0.00–1.49)
Prothrombin Time: 16.5 seconds — ABNORMAL HIGH (ref 11.6–15.2)

## 2013-05-25 MED ORDER — DOCUSATE SODIUM 100 MG PO CAPS
100.0000 mg | ORAL_CAPSULE | Freq: Two times a day (BID) | ORAL | Status: DC
  Administered 2013-05-25 – 2013-05-28 (×5): 100 mg via ORAL
  Filled 2013-05-25 (×7): qty 1

## 2013-05-25 MED ORDER — TEMAZEPAM 15 MG PO CAPS
15.0000 mg | ORAL_CAPSULE | Freq: Once | ORAL | Status: AC
  Administered 2013-05-25: 15 mg via ORAL
  Filled 2013-05-25: qty 1

## 2013-05-25 MED ORDER — WARFARIN SODIUM 2.5 MG PO TABS
12.5000 mg | ORAL_TABLET | Freq: Once | ORAL | Status: AC
  Administered 2013-05-25: 12.5 mg via ORAL
  Filled 2013-05-25: qty 1

## 2013-05-25 MED ORDER — INSULIN GLARGINE 100 UNIT/ML ~~LOC~~ SOLN
4.0000 [IU] | Freq: Two times a day (BID) | SUBCUTANEOUS | Status: DC
  Administered 2013-05-25 – 2013-05-26 (×3): 4 [IU] via SUBCUTANEOUS
  Filled 2013-05-25 (×4): qty 0.04

## 2013-05-25 MED ORDER — METOCLOPRAMIDE HCL 5 MG PO TABS
5.0000 mg | ORAL_TABLET | Freq: Three times a day (TID) | ORAL | Status: DC
  Administered 2013-05-26 – 2013-05-28 (×4): 5 mg via ORAL
  Filled 2013-05-25 (×9): qty 1

## 2013-05-25 MED ORDER — FLUOXETINE HCL 20 MG/5ML PO SOLN
5.0000 mg | Freq: Every day | ORAL | Status: DC
  Administered 2013-05-26 – 2013-05-28 (×3): 5 mg via ORAL
  Filled 2013-05-25 (×3): qty 5

## 2013-05-25 MED ORDER — FLUOXETINE HCL 10 MG PO TABS: 5.0000 mg | ORAL_TABLET | Freq: Every morning | ORAL | Status: DC

## 2013-05-25 MED ORDER — ENOXAPARIN SODIUM 60 MG/0.6ML ~~LOC~~ SOLN
1.0000 mg/kg | SUBCUTANEOUS | Status: DC
  Administered 2013-05-25 – 2013-05-27 (×3): 60 mg via SUBCUTANEOUS
  Filled 2013-05-25 (×4): qty 0.6

## 2013-05-25 MED ORDER — INSULIN ASPART 100 UNIT/ML ~~LOC~~ SOLN
5.0000 [IU] | Freq: Once | SUBCUTANEOUS | Status: AC
  Administered 2013-05-25: 5 [IU] via SUBCUTANEOUS

## 2013-05-25 MED ORDER — GLUCAGON HCL (RDNA) 1 MG IJ SOLR
INTRAMUSCULAR | Status: AC
  Administered 2013-05-25: 05:00:00
  Filled 2013-05-25: qty 1

## 2013-05-25 MED ORDER — HYDRALAZINE HCL 10 MG PO TABS
10.0000 mg | ORAL_TABLET | Freq: Three times a day (TID) | ORAL | Status: DC
  Administered 2013-05-25 – 2013-05-27 (×5): 10 mg via ORAL
  Filled 2013-05-25 (×12): qty 1

## 2013-05-25 NOTE — Progress Notes (Signed)
Hypoglycemic Event  05/25/2013 @ 0501 CBG: 56 Treatment: Patient is still eating a snack Symptoms: None, asymptomatic Follow-up CBG: Time: 0544 CBG Result: 149 Possible Reasons for Event: Unknown Comments/MD notified: Benedetto Coons. Callahan NP

## 2013-05-25 NOTE — Progress Notes (Signed)
I have seen and examined this patient and agree with the plan of care  Marcus [name] W 05/25/2013, 11:08 AM  

## 2013-05-25 NOTE — Progress Notes (Signed)
Hypoglycemic Event 05/24/2013 @ 2154 CBG: 35   Treatment: Amp of D50 50mL Symptoms: lethargy, confusion, anxious, sweating Follow-up CBG: Time: 2230  CBG Result: 144 Possible Reasons for Event:  Comments/MD notified:

## 2013-05-25 NOTE — Progress Notes (Addendum)
ANTICOAGULATION CONSULT NOTE - Follow Up Consult  Pharmacy Consult for Lovenox/Warfarin Indication: DVTs  Allergies  Allergen Reactions  . Penicillins Other (See Comments)    Unknown reaction    Patient Measurements: Height: 6\' 2"  (188 cm) Weight: 133 lb 9.6 oz (60.6 kg) IBW/kg (Calculated) : 82.2  Vital Signs: Temp: 97.9 F (36.6 C) (02/08 0822) Temp src: Oral (02/08 0822) BP: 189/91 mmHg (02/08 0822) Pulse Rate: 70 (02/08 0822)  Labs:  Recent Labs  05/23/13 0450 05/23/13 1500 05/23/13 2300 05/24/13 0445  HGB 9.3*  --   --  9.3*  HCT 29.2*  --   --  28.7*  PLT 202  --   --  183  LABPROT 17.1*  --   --  19.1*  INR 1.43  --   --  1.66*  HEPARINUNFRC 0.24* 0.76* 0.39 0.37  CREATININE 6.22*  --   --   --     Estimated Creatinine Clearance: 11.2 ml/min (by C-G formula based on Cr of 6.22).   Assessment: 58 y/[facility] M [facility] w/ new L distal subclavian DVT. Patient has prior history of multiple DVTS/PE on chronic coumadin at home (PTA takes 7.5mg  PO Daily, however has been off for ~1 mo). Patient is hard draw, one arm has old av fistula that limit draws to the right arm, however patient has IV line that is on the top of his hand and they will not stick him anywhere above that line. MD decided to switch to lovenox, and will try for INR today, hoping patient will be therapeutic and can be sent home soon. Patient INR subtherapeutic, MD placed order for 12.5 mg x1 tonight> patient had inc in appetite and ate 75% of his meals.  Currently on coumadin w/ now lovenox bridge on day 6.  No unusual s/s of bleeding noted.  Goal of Therapy:  Monitor platelets by anticoagulation protocol: Yes   Plan:  Start Lovenox 60mg  SQ daily hour after heparin gtt has been turned off. Coumadin 12.5 x1 tonight Follow-up on INR Monitor for signs of bleed  Ayala Ribble M. Allena KatzPatel, PharmD Clinical Pharmacist- Resident Pager: [phone] Pharmacy: [phone] 05/25/2013 11:25  AM

## 2013-05-25 NOTE — Progress Notes (Signed)
Hypoglycemic Event  05/25/2013 @ 0427 CBG: 50 Treatment: Glucose IM inj and eating a snack Symptoms: Asymptomatic  Follow-up CBG: Time: 0501  CBG Result: 56 Possible Reasons for Event:  Comments/MD notified:

## 2013-05-25 NOTE — Progress Notes (Signed)
Chart reviewed.  [facility] PROGRESS NOTE  Marcus [name] ZOX:[id number] DOB: 1955/07/21 DOA: 05/19/2013 PCP: Galvin Proffer, MD  Marcus [name] is a 58 y.o. male with a history listed above but significant for multiple DVTs, PEs, CVAs and ESRD who dialyzes normally on M/W/F. Marcus [name] presented to the [facility] ER with left upper extremity pain. Ultrasound at [facility] showed a left distal subclavian DVT. Marcus [name] reports that he hasn't taken his coumadin for approximately 1 month because he could not get an order for it. He has been compliant with his other medications including insulin. He reportedly has been to the ER 5x last week with altered mental status due to hypoglycemia. Further Marcus [name] has recurrent problems with dialysis access. He had an infected graft removed from his left groin on 12/31. On exam this area is still tender to palpation and mildly indurated. He is currently using a right femoral catheter for HD access  Assessment/Plan: Acute Left Upper Extremity DVT  INR now lower. Increase coumadin per pharmacy Transitioned to lovenox, as heparin gtt and IV placement made blood draw near impossible.  Recurrent Hypoglycemia in Type 1 DM  Very brittle DM. Lows overnight. Insulin adjusted.   Nausea  on reglan -improved  Severe malnutrition -dietician  ESRD  Appreciate renal consultation  Scheduled dialysis is M/W/F in [place].   Hyperkalemia  resolved  D/c telemetry  uncontrolled hypertension  - Resume home antihypertensives-amlodipine, clonidine and metoprolol   Recent history of infected graft in left groin  Does not appear acutely infected, no drainage.  Seen by vascular  Hypothyroidism  Recent history of elevated TSH.  Synthroid dose was adjusted 12/31.  -does not appear to have been taking medications   Code Status: full Family Communication: patient Disposition Plan: home with PT when INR  therapeutic   Consultants:  Vascular  renal  Procedures:    Antibiotics:    HPI/Subjective: Wants to go home. Wants dressing on leg changed  Objective: Filed Vitals:   05/25/13 1130  BP: 194/91  Pulse: 65  Temp: 98.5 F (36.9 C)  Resp: 18    Intake/Output Summary (Last 24 hours) at 05/25/13 1415 Last data filed at 05/25/13 1019  Gross per 24 hour  Intake    480 ml  Output      0 ml  Net    480 ml   Filed Weights   05/23/13 0807 05/23/13 1216 05/24/13 2230  Weight: 63.9 kg (140 lb 14 oz) 60.9 kg (134 lb 4.2 oz) 60.6 kg (133 lb 9.6 oz)    Exam:   General:  Marcus [name]. oriented  Cardiovascular: rrr without WRR  Respiratory: clear anterior  Abdomen: +BS, soft  Musculoskeletal: dressing on left leg with dried blood. Right hand swollen after  Data Reviewed: Basic Metabolic Panel:  Recent Labs Lab 05/19/13 1951 05/20/13 0700 05/20/13 2004 05/21/13 0548 05/23/13 0450  NA 125* 123* 129* 129* 131*  K >7.7* 6.4* 4.3 4.7 4.4  CL 86* 83* 90* 91* 91*  CO2 19 15* 23 22 23   GLUCOSE 382* 571* 222* 313* 126*  BUN 70* 48* 25* 35* 46*  CREATININE 9.11* 6.82* 4.31* 5.04* 6.22*  CALCIUM 8.0* 8.5 8.5 8.5 8.9  PHOS 6.6* 6.1* 4.6 5.2* 4.9*   Liver Function Tests:  Recent Labs Lab 05/19/13 1951 05/20/13 0700 05/20/13 2004 05/21/13 0548 05/23/13 0450  ALBUMIN 2.9* 3.1* 2.9* 2.7* 2.9*   No results found for this basename: LIPASE, AMYLASE,  in the last 168 hours No results  found for this basename: AMMONIA,  in the last 168 hours CBC:  Recent Labs Lab 05/21/13 0548 05/22/13 0436 05/23/13 0450 05/24/13 0445 05/25/13 1310  WBC 5.0 6.6 8.2 5.9 5.4  HGB 10.3* 9.7* 9.3* 9.3* 9.3*  HCT 31.3* 30.8* 29.2* 28.7* 28.5*  MCV 89.7 91.4 90.7 90.5 89.3  PLT 175 188 202 183 234   Cardiac Enzymes: No results found for this basename: CKTOTAL, CKMB, CKMBINDEX, TROPONINI,  in the last 168 hours BNP (last 3 results) No results found for this basename: PROBNP,   in the last 8760 hours CBG:  Recent Labs Lab 05/25/13 0028 05/25/13 0427 05/25/13 0501 05/25/13 0544 05/25/13 0820  GLUCAP 130* 50* 56* 149* 159*    No results found for this or any previous visit (from the past 240 hour(s)).   Studies: No results found.  Scheduled Meds: . amLODipine  10 mg Oral QHS  . cloNIDine  0.1 mg Oral TID  . darbepoetin (ARANESP) injection - DIALYSIS  60 mcg Intravenous Q Fri-HD  . enoxaparin (LOVENOX) injection  1 mg/kg Subcutaneous Q24H  . feeding supplement (NEPRO CARB STEADY)  237 mL Oral BID BM  . ferric gluconate (FERRLECIT/NULECIT) IV  125 mg Intravenous Q M,W,F-HD  . insulin aspart  0-9 Units Subcutaneous TID WC  . insulin glargine  4 Units Subcutaneous BID  . levothyroxine  168 mcg Oral QAC breakfast  . metoCLOPramide (REGLAN) injection  5 mg Intravenous Q8H  . metoprolol  75 mg Oral BID  . pantoprazole  40 mg Oral Daily  . sevelamer carbonate  800 mg Oral TID WC  . simvastatin  5 mg Oral q1800  . sodium chloride  3 mL Intravenous Q12H  . sodium chloride  3 mL Intravenous Q12H  . temazepam  15 mg Oral Custom  . Warfarin - Pharmacist Dosing Inpatient   Does not apply q1800   Continuous Infusions:    Principal Problem:   Arm DVT (deep venous thromboembolism), acute Active Problems:   Diabetes mellitus   Hypoglycemia due to insulin   Acute DVT (deep venous thrombosis)    Time spent: 35 min  [name]  [facility] Pager [phone]. If 7PM-7AM, please contact night-coverage at [email or web address], password [facility] 05/25/2013, 2:15 PM  LOS: 6 days

## 2013-05-25 NOTE — Progress Notes (Signed)
Hypoglycemic Event  05/24/2013 @ 2343 CBG: 46  Treatment: Amp of D50 50mL Symptoms: lethargy, confusion  Follow-up CBG: Time: 0014  CBG Result: 112 Possible Reasons for Event:  Comments/MD notified: Benedetto Coons. Callahan NP

## 2013-05-25 NOTE — Progress Notes (Signed)
Subjective:  Thinks his Right arm is starting to swell now/ for HD in am Objective Vital signs in last 24 hours: Filed Vitals:   05/24/13 1559 05/24/13 2230 05/25/13 0430 05/25/13 0822  BP: 156/79 181/93 162/84 189/91  Pulse: 75 75 67 70  Temp: 98 F (36.7 C) 97.5 F (36.4 C) 97.7 F (36.5 C) 97.9 F (36.6 C)  TempSrc: Oral Oral Oral Oral  Resp: 19 20 18 20   Height:  6\' 2"  (1.88 m)    Weight:  60.6 kg (133 lb 9.6 oz)    SpO2: 98% 100% 99% 96%   Weight change: -3.3 kg (-7 lb 4.4 oz)  Labs: Basic Metabolic Panel:  Recent Labs Lab 05/20/13 2004 05/21/13 0548 05/23/13 0450  NA 129* 129* 131*  K 4.3 4.7 4.4  CL 90* 91* 91*  CO2 23 22 23   GLUCOSE 222* 313* 126*  BUN 25* 35* 46*  CREATININE 4.31* 5.04* 6.22*  CALCIUM 8.5 8.5 8.9  PHOS 4.6 5.2* 4.9*    Recent Labs Lab 05/20/13 2004 05/21/13 0548 05/23/13 0450  ALBUMIN 2.9* 2.7* 2.9*  CBC:  Recent Labs Lab 05/19/13 1335 05/21/13 0548 05/22/13 0436 05/23/13 0450 05/24/13 0445  WBC 6.1 5.0 6.6 8.2 5.9  HGB 12.3* 10.3* 9.7* 9.3* 9.3*  HCT 37.2* 31.3* 30.8* 29.2* 28.7*  MCV 90.7 89.7 91.4 90.7 90.5  PLT 196 175 188 202 183  CBG:  Recent Labs Lab 05/25/13 0014 05/25/13 0028 05/25/13 0427 05/25/13 0501 05/25/13 0544  GLUCAP 112* 130* 50* 56* 149*    Studies/Results: No results found. Medications: . heparin 1,550 Units/hr (05/24/13 2206)   . amLODipine  10 mg Oral QHS  . cloNIDine  0.1 mg Oral TID  . darbepoetin (ARANESP) injection - DIALYSIS  60 mcg Intravenous Q Fri-HD  . feeding supplement (NEPRO CARB STEADY)  237 mL Oral BID BM  . ferric gluconate (FERRLECIT/NULECIT) IV  125 mg Intravenous Q M,W,F-HD  . insulin aspart  0-5 Units Subcutaneous QHS  . insulin aspart  0-9 Units Subcutaneous TID WC  . insulin aspart  5 Units Subcutaneous TID WC  . insulin glargine  9 Units Subcutaneous BID  . levothyroxine  168 mcg Oral QAC breakfast  . metoCLOPramide (REGLAN) injection  5 mg Intravenous Q8H  .  metoprolol  75 mg Oral BID  . pantoprazole  40 mg Oral Daily  . sevelamer carbonate  800 mg Oral TID WC  . simvastatin  5 mg Oral q1800  . sodium chloride  3 mL Intravenous Q12H  . sodium chloride  3 mL Intravenous Q12H  . temazepam  15 mg Oral Custom  . Warfarin - Pharmacist Dosing Inpatient   Does not apply q1800   Physical Exam:  General: alert nad  Heart: RRR no rub or mur  Lungs: CTA  Abdomen: soft ,nt, nd  Extremities: Dialysis Access: No pedal edema/ Larm swelling resolving /mild R upper extrm swelling/ R Fem Perm cath   Dialysis Orders: [facility]WF [facility]  4:15, 58 kg 2K/2.5Ca 450/A1.5 Heparin 0 R femoral catheter  Hectorol 0 Epogen 0 Venofer 100 mg x 10 (2/2 - 23)   Assessment/Plan:  1. 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 Hypoglycemia - per primary. 3. Hyperkalemia - Admit K > 7.7=resolved with HD 4. ESRD - HD on MWF [facility]/ using perm cath  See 9 below 5. Hypertension/volume - BP up pre hd= 189/91 on bp meds= Amlodipine 10 mg qhs,Clonidine 0.33mTID, Metoprolol 75 mg bid /  yesterday wt 60.6kg/ EDW 58/ Needing STANDING WTS / challenge wt with uf in am 4 to 4.5l eating well. 5. Anemia - Hgb down to 9.3, no outpatient Epogen, but started Aranesp In hosp. And venofer series (x 10) 2/2. 6. Metabolic bone disease - Ca 8.9 (10.0corrected), P 4.9, iPTH 459; no Hectorol, phoslo 2 with meals. 7. Nutrition - Alb 2.7>2.9, renal diet & vitamin. 8. Hx infected access - R thigh AVG removed 12/31, now with R femoral catheter,VVS plans to move catheter to IJ and place new R thigh AVG 2/27 per VVS. 9. Hx thyroid goiter - s/p neck exploration & lymph node biopsy (benign) per Dr. Gerrit Friends 01/28/13, but may require thyroidectomy involving median sternotomy; on Synthroid.  Lenny Pastel, PA-C [facility] Beeper [phone] 05/25/2013,8:32 AM  LOS: 6 days

## 2013-05-25 NOTE — Discharge Instructions (Signed)
Information on my medicine - Coumadin   (Warfarin)  This medication education was reviewed with me or my healthcare representative as part of my discharge preparation.  The pharmacist that spoke with me during my hospital stay was:  Dolphus Jennyatel, Lynette Noah M, [facility]  Why was Coumadin prescribed for you? Coumadin was prescribed for you because you have a blood clot or a medical condition that can cause an increased risk of forming blood clots. Blood clots can cause serious health problems by blocking the flow of blood to the heart, lung, or brain. Coumadin can prevent harmful blood clots from forming. As a reminder your indication for Coumadin is:   Deep Vein Thrombosis Treatment  What test will check on my response to Coumadin? While on Coumadin (warfarin) you will need to have an INR test regularly to ensure that your dose is keeping you in the desired range. The INR (international normalized ratio) number is calculated from the result of the laboratory test called prothrombin time (PT).  If an INR APPOINTMENT HAS NOT ALREADY BEEN MADE FOR YOU please schedule an appointment to have this lab work done by your health care provider within 7 days. Your INR goal is usually a number between:  2 to 3 or your provider may give you a more narrow range like 2-2.5.  Ask your health care provider during an office visit what your goal INR is.  What  do you need to  know  About  COUMADIN? Take Coumadin (warfarin) exactly as prescribed by your healthcare provider about the same time each day.  DO NOT stop taking without talking to the doctor who prescribed the medication.  Stopping without other blood clot prevention medication to take the place of Coumadin may increase your risk of developing a new clot or stroke.  Get refills before you run out.  What do you do if you miss a dose? If you miss a dose, take it as soon as you remember on the same day then continue your regularly scheduled regimen the next day.  Do not take  two doses of Coumadin at the same time.  Important Safety Information A possible side effect of Coumadin (Warfarin) is an increased risk of bleeding. You should call your healthcare provider right away if you experience any of the following:   Bleeding from an injury or your nose that does not stop.   Unusual colored urine (red or dark brown) or unusual colored stools (red or black).   Unusual bruising for unknown reasons.   A serious fall or if you hit your head (even if there is no bleeding).  Some foods or medicines interact with Coumadin (warfarin) and might alter your response to warfarin. To help avoid this:   Eat a balanced diet, maintaining a consistent amount of Vitamin K.   Notify your provider about major diet changes you plan to make.   Avoid alcohol or limit your intake to 1 drink for women and 2 drinks for men per day. (1 drink is 5 oz. wine, 12 oz. beer, or 1.5 oz. liquor.)  Make sure that ANY health care provider who prescribes medication for you knows that you are taking Coumadin (warfarin).  Also make sure the healthcare provider who is monitoring your Coumadin knows when you have started a new medication including herbals and non-prescription products.  Coumadin (Warfarin)  Major Drug Interactions  Increased Warfarin Effect Decreased Warfarin Effect  Alcohol (large quantities) Antibiotics (esp. Septra/Bactrim, Flagyl, Cipro) Amiodarone (Cordarone) Aspirin (ASA) Cimetidine (  Tagamet) °Megestrol (Megace) °NSAIDs (ibuprofen, naproxen, etc.) °Piroxicam (Feldene) °Propafenone (Rythmol SR) °Propranolol (Inderal) °Isoniazid (INH) °Posaconazole (Noxafil) Barbiturates (Phenobarbital) °Carbamazepine (Tegretol) °Chlordiazepoxide (Librium) °Cholestyramine (Questran) °Griseofulvin °Oral Contraceptives °Rifampin °Sucralfate (Carafate) °Vitamin K  ° °Coumadin® (Warfarin) Major Herbal Interactions  °Increased Warfarin Effect Decreased Warfarin Effect  °Garlic °Ginseng °Ginkgo biloba  Coenzyme Q10 °Green tea °St. John’s wort   ° °Coumadin® (Warfarin) FOOD Interactions  °Eat a consistent number of servings per week of foods HIGH in Vitamin K °(1 serving = ½ cup)  °Collards (cooked, or boiled & drained) °Kale (cooked, or boiled & drained) °Mustard greens (cooked, or boiled & drained) °Parsley *serving size only = ¼ cup °Spinach (cooked, or boiled & drained) °Swiss chard (cooked, or boiled & drained) °Turnip greens (cooked, or boiled & drained)  °Eat a consistent number of servings per week of foods MEDIUM-HIGH in Vitamin K °(1 serving = 1 cup)  °Asparagus (cooked, or boiled & drained) °Broccoli (cooked, boiled & drained, or raw & chopped) °Brussel sprouts (cooked, or boiled & drained) *serving size only = ½ cup °Lettuce, raw (green leaf, endive, romaine) °Spinach, raw °Turnip greens, raw & chopped  ° °These websites have more information on Coumadin (warfarin):  www.coumadin.com; °www.ahrq.gov/consumer/coumadin.htm; ° ° ° °

## 2013-05-26 LAB — GLUCOSE, CAPILLARY
GLUCOSE-CAPILLARY: 153 mg/dL — AB (ref 70–99)
GLUCOSE-CAPILLARY: 341 mg/dL — AB (ref 70–99)
GLUCOSE-CAPILLARY: 322 mg/dL — AB (ref 70–99)
GLUCOSE-CAPILLARY: 191 mg/dL — AB (ref 70–99)
Glucose-Capillary: 376 mg/dL — ABNORMAL HIGH (ref 70–99)
Glucose-Capillary: 223 mg/dL — ABNORMAL HIGH (ref 70–99)
Glucose-Capillary: 28 mg/dL — CL (ref 70–99)
Glucose-Capillary: 274 mg/dL — ABNORMAL HIGH (ref 70–99)
Glucose-Capillary: 299 mg/dL — ABNORMAL HIGH (ref 70–99)
Glucose-Capillary: 242 mg/dL — ABNORMAL HIGH (ref 70–99)
Glucose-Capillary: 213 mg/dL — ABNORMAL HIGH (ref 70–99)
Glucose-Capillary: 202 mg/dL — ABNORMAL HIGH (ref 70–99)
Glucose-Capillary: 312 mg/dL — ABNORMAL HIGH (ref 70–99)
Glucose-Capillary: 319 mg/dL — ABNORMAL HIGH (ref 70–99)
Glucose-Capillary: 242 mg/dL — ABNORMAL HIGH (ref 70–99)

## 2013-05-26 LAB — RENAL FUNCTION PANEL
Albumin: 2.7 g/dL — ABNORMAL LOW (ref 3.5–5.2)
Albumin: 2.9 g/dL — ABNORMAL LOW (ref 3.5–5.2)
BUN: 72 mg/dL — ABNORMAL HIGH (ref 6–23)
BUN: 50 mg/dL — ABNORMAL HIGH (ref 6–23)
CALCIUM: 8.7 mg/dL (ref 8.4–10.5)
CALCIUM: 8.6 mg/dL (ref 8.4–10.5)
CO2: 22 mEq/L (ref 19–32)
CO2: 24 mEq/L (ref 19–32)
Chloride: 87 mEq/L — ABNORMAL LOW (ref 96–112)
Chloride: 90 mEq/L — ABNORMAL LOW (ref 96–112)
Creatinine, Ser: 7.52 mg/dL — ABNORMAL HIGH (ref 0.50–1.35)
Creatinine, Ser: 5.23 mg/dL — ABNORMAL HIGH (ref 0.50–1.35)
GFR calc Af Amer: 8 mL/min — ABNORMAL LOW (ref >90)
GFR calc non Af Amer: 7 mL/min — ABNORMAL LOW (ref >90)
GFR calc non Af Amer: 11 mL/min — ABNORMAL LOW (ref >90)
GFR, EST AFRICAN AMERICAN: 13 mL/min — AB (ref >90)
GLUCOSE: 317 mg/dL — AB (ref 70–99)
Glucose, Bld: 234 mg/dL — ABNORMAL HIGH (ref 70–99)
PHOSPHORUS: 6.1 mg/dL — AB (ref 2.3–4.6)
Phosphorus: 3.3 mg/dL (ref 2.3–4.6)
Potassium: >7.7 mEq/L (ref 3.7–5.3)
Potassium: 4 mEq/L (ref 3.7–5.3)
Sodium: 126 mEq/L — ABNORMAL LOW (ref 137–147)
Sodium: 130 mEq/L — ABNORMAL LOW (ref 137–147)

## 2013-05-26 LAB — PROTIME-INR
INR: 1.29 (ref 0.00–1.49)
PROTHROMBIN TIME: 15.8 s — AB (ref 11.6–15.2)

## 2013-05-26 LAB — CBC
HCT: 29.3 % — ABNORMAL LOW (ref 39.0–52.0)
Hemoglobin: 9.6 g/dL — ABNORMAL LOW (ref 13.0–17.0)
MCH: 29.1 pg (ref 26.0–34.0)
MCHC: 32.8 g/dL (ref 30.0–36.0)
MCV: 88.8 fL (ref 78.0–100.0)
PLATELETS: 239 10*3/uL (ref 150–400)
RBC: 3.3 MIL/uL — AB (ref 4.22–5.81)
RDW: 15.9 % — ABNORMAL HIGH (ref 11.5–15.5)
WBC: 15.1 10*3/uL — ABNORMAL HIGH (ref 4.0–10.5)

## 2013-05-26 MED ORDER — GLUCOSE 40 % PO GEL
ORAL | Status: AC
  Administered 2013-05-26: 37.5 g
  Filled 2013-05-26: qty 1

## 2013-05-26 MED ORDER — GLUCAGON HCL (RDNA) 1 MG IJ SOLR
INTRAMUSCULAR | Status: AC
  Administered 2013-05-26: 1 mg
  Filled 2013-05-26: qty 1

## 2013-05-26 MED ORDER — DEXTROSE 50 % IV SOLN
INTRAVENOUS | Status: AC
  Administered 2013-05-26: 50 mL
  Filled 2013-05-26: qty 50

## 2013-05-26 MED ORDER — INSULIN GLARGINE 100 UNIT/ML ~~LOC~~ SOLN
6.0000 [IU] | Freq: Every day | SUBCUTANEOUS | Status: DC
Start: 2013-05-27 — End: 2013-05-27
  Administered 2013-05-27: 6 [IU] via SUBCUTANEOUS
  Filled 2013-05-26 (×2): qty 0.06

## 2013-05-26 MED ORDER — SODIUM CHLORIDE 0.9 % IJ SOLN
10.0000 mL | INTRAMUSCULAR | Status: DC | PRN
  Administered 2013-05-26 – 2013-05-28 (×3): 10 mL

## 2013-05-26 MED ORDER — CALCIUM ACETATE 667 MG PO CAPS
1334.0000 mg | ORAL_CAPSULE | Freq: Three times a day (TID) | ORAL | Status: DC
  Administered 2013-05-26 – 2013-05-28 (×4): 1334 mg via ORAL
  Filled 2013-05-26 (×9): qty 2

## 2013-05-26 MED ORDER — DOXERCALCIFEROL 4 MCG/2ML IV SOLN
INTRAVENOUS | Status: AC
  Filled 2013-05-26: qty 2

## 2013-05-26 MED ORDER — GLUCOSE-VITAMIN C 4-6 GM-MG PO CHEW
CHEWABLE_TABLET | ORAL | Status: AC
  Filled 2013-05-26: qty 1

## 2013-05-26 MED ORDER — WARFARIN SODIUM 7.5 MG PO TABS
15.0000 mg | ORAL_TABLET | Freq: Once | ORAL | Status: AC
  Administered 2013-05-26: 15 mg via ORAL
  Filled 2013-05-26: qty 2

## 2013-05-26 MED ORDER — DOXERCALCIFEROL 4 MCG/2ML IV SOLN
2.0000 ug | INTRAVENOUS | Status: DC
  Administered 2013-05-26: 2 ug via INTRAVENOUS
  Filled 2013-05-26 (×2): qty 2

## 2013-05-26 NOTE — Progress Notes (Signed)
Hemodialysis: Dr. Arlean Hoppingschertz paged about critical potassium >7.7. Order to redraw and place on 1K for now. Done. Continue to monitor pt.

## 2013-05-26 NOTE — Progress Notes (Signed)
Hemodialysis=Repeat K=4.0. Placed back on a 2K bath per Mariea ClontsKaren W PA.

## 2013-05-26 NOTE — Progress Notes (Signed)
[facility] PROGRESS NOTE  Marcus JacksonCharles Kook QIO:[id number] DOB: Mar 20, 1956 DOA: 05/19/2013 PCP: Galvin ProfferHAGUE, [name], MD  Marcus [name] is a 58 y.o. male with a history listed above but significant for multiple DVTs, PEs, CVAs and ESRD who dialyzes normally on M/W/F. Marcus [name] with left upper extremity pain. [facility] distal [facility]cl[facility] D[facility]. Marcus [name] reports that he hasn't taken his coumadin for approximately 1 month because he could not get an order for it. He has been compliant with his other medications including insulin. He reportedly has been to the ER 5x last week with altered mental status due to hypoglycemia. Further Marcus [name] has recurrent problems with dialysis access. He had an infected graft removed from his left groin on 12/31. On exam this area is still tender to palpation and mildly indurated. He is currently using a right femoral catheter for HD access  Assessment/Plan: Acute Left Upper Extremity DVT  INR lower despite increasing coumadin. Increase to 15 mg. Cont lovenox  Recurrent Hypoglycemia in Type 1 DM  Lows again last night. D/c pm lantus   Nausea  on reglan -improved  Severe malnutrition -dietician  ESRD  Appreciate renal consultation  Scheduled dialysis is M/W/F in [place].   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  Seen by vascular  Hypothyroidism  Recent history of elevated TSH.  Synthroid dose was adjusted 12/31.  -does not appear to have been taking medications   Code Status: full Family Communication: patient Disposition Plan: home with PT when INR therapeutic   Consultants:  Vascular  renal  Procedures:    Antibiotics:    HPI/Subjective: Wants to go home. No  complaints  Objective: Filed Vitals:   05/26/13 2139  BP: 177/82  Pulse: 89  Temp: 99.3 F (37.4 C)  Resp: 14    Intake/Output Summary (Last 24 hours) at 05/26/13 2144 Last data filed at 05/26/13 1705  Gross per 24 hour  Intake    240 ml  Output   5000 ml  Net  -4760 ml   Filed Weights   05/25/13 2046 05/26/13 1223 05/26/13 1705  Weight: 61.9 kg (136 lb 7.4 oz) 65.9 kg (145 lb 4.5 oz) 60 kg (132 lb 4.4 oz)    Exam:   General:  Asleep on dialysis. arousable  Cardiovascular: rrr without WRR  Respiratory: clear anterior  Abdomen: +BS, soft  Musculoskeletal: dressing on left leg with dried blood. Right hand swollen after  Data Reviewed: Basic Metabolic Panel:  Recent Labs Lab 05/20/13 2004 05/21/13 0548 05/23/13 0450 05/26/13 1258 05/26/13 1405  NA 129* 129* 131* 126* 130*  K 4.3 4.7 4.4 >7.7* 4.0  CL 90* 91* 91* 87* 90*  CO2 23 22 23 22 24   GLUCOSE 222* 313* 126* 317* 234*  BUN 25* 35* 46* 72* 50*  CREATININE 4.31* 5.04* 6.22* 7.52* 5.23*  CALCIUM 8.5 8.5 8.9 8.7 8.6  PHOS 4.6 5.2* 4.9* 6.1* 3.3   Liver Function Tests:  Recent Labs Lab 05/20/13 2004 05/21/13 0548 05/23/13 0450 05/26/13 1258 05/26/13 1405  ALBUMIN 2.9* 2.7* 2.9* 2.7* 2.9*   No results found for this basename: LIPASE, AMYLASE,  in the last 168 hours No results found for this basename: AMMONIA,  in the last 168 hours CBC:  Recent Labs Lab 05/22/13 0436 05/23/13 0450 05/24/13  0445 05/25/13 1310 05/26/13 0420  WBC 6.6 8.2 5.9 5.4 15.1*  HGB 9.7* 9.3* 9.3* 9.3* 9.6*  HCT 30.8* 29.2* 28.7* 28.5* 29.3*  MCV 91.4 90.7 90.5 89.3 88.8  PLT 188 202 183 234 239   Cardiac Enzymes: No results found for this basename: CKTOTAL, CKMB, CKMBINDEX, TROPONINI,  in the last 168 hours BNP (last 3 results) No results found for this basename: PROBNP,  in the last 8760 hours CBG:  Recent Labs Lab 05/26/13 0625 05/26/13 0753 05/26/13 0905 05/26/13 0954 05/26/13 1220  GLUCAP 242* 213*  202* 312* 319*    No results found for this or any previous visit (from the past 240 hour(s)).   Studies: No results found.  Scheduled Meds: . amLODipine  10 mg Oral QHS  . calcium acetate  1,334 mg Oral TID WC  . cloNIDine  0.1 mg Oral TID  . darbepoetin (ARANESP) injection - DIALYSIS  60 mcg Intravenous Q Fri-HD  . docusate sodium  100 mg Oral BID  . doxercalciferol      . doxercalciferol  2 mcg Intravenous Q M,W,F-HD  . enoxaparin (LOVENOX) injection  1 mg/kg Subcutaneous Q24H  . feeding supplement (NEPRO CARB STEADY)  237 mL Oral BID BM  . ferric gluconate (FERRLECIT/NULECIT) IV  125 mg Intravenous Q M,W,F-HD  . FLUoxetine  5 mg Oral Daily  . hydrALAZINE  10 mg Oral Q8H  . insulin aspart  0-9 Units Subcutaneous TID WC  . [START ON 05/27/2013] insulin glargine  6 Units Subcutaneous Daily  . levothyroxine  168 mcg Oral QAC breakfast  . metoCLOPramide  5 mg Oral TID AC  . metoprolol  75 mg Oral BID  . pantoprazole  40 mg Oral Daily  . sevelamer carbonate  800 mg Oral TID WC  . simvastatin  5 mg Oral q1800  . sodium chloride  3 mL Intravenous Q12H  . temazepam  15 mg Oral Custom  . Warfarin - Pharmacist Dosing Inpatient   Does not apply q1800   Continuous Infusions:    Time spent: 25 min  Analyn Matusek L  [facility] Pager [phone]. If 7PM-7AM, please contact night-coverage at [email or web address], password [facility] 05/26/2013, 9:44 PM  LOS: 7 days

## 2013-05-26 NOTE — Progress Notes (Signed)
[facility] Progress Note  Subjective:   Non-specific complaints of just feeling bad. Hungry but not able to eat. Tolerates nepro  Objective Filed Vitals:   05/25/13 1643 05/25/13 2046 05/26/13 0500 05/26/13 0813  BP: 184/90 175/87 184/88 173/86  Pulse: 71 69 68 108  Temp: 97.9 F (36.6 C) 97.8 F (36.6 C) 97.5 F (36.4 C) 97.4 F (36.3 C)  TempSrc: Oral Oral Oral Oral  Resp: 19 18 20 18   Height:      Weight:  61.9 kg (136 lb 7.4 oz)    SpO2: 96% 100% 99% 100%   Physical Exam General: Chronically ill-appearing, despondent, NAD Heart: RRR Lungs: CTA bilat, no appreciable wheezes or rhonchi Abdomen: soft, NT, + BS Extremities: RUE swelling, No LE edema Dialysis Access: Rt Fem TDC  Dialysis Orders: MWF / [facility]  4:15, 58 kg F160 2K/2.5Ca 450/A1.5 Heparin 0 R femoral catheter  Hectorol 0 Epogen 0 Venofer 100 mg x 10 (2/2 - 23)   Assessment/Plan: 1. DVT - @ L distal subclavian per [place]; Hx DVTs & PEs on chronic Coumadin but had not been able to obtain for the last month; now on Coumadin per pharmacy /needs outpt planning for anticoac before d/c. INR 1.29 2. Type 1 DM/ Hypoglycemia - Mgmt per primary. 3. ESRD -  MWF, hyperkalemia resolved. HD today via rt fem TDC 4. Hypertension/volume - SBPs a little better since admit. Now 170s/180s on Amlodipine 10 mg qhs,Clonidine 0.34mTID, Metoprolol 75 mg bid and hydralazine 10 mg q 8hr. Up about 4 kg by wgts. EDW lowered op on 1/28. Not quite making it. Max UF as tolerated. 5. Anemia - Hgb down to 9.6. On Aranesp 60 q Fri. Fe Load initiated op on 2/2 x 10 doses. Will continue here. 6. Metabolic bone disease - Ca 8.9 (9.8 corrected) , P 4.9 on Phoslo 2 + Renvela 1 ac. Last PTH 459 > 56 in October. Not  previously on Hectorol. Will start here at 2 mcg IV/HD and continue at d/c. Renal panel pending. 7. Nutrition - Alb 2.9, renal diet, multivitamin, nepro 8. Hx infected access - R thigh AVG removed 12/31, now with R femoral  catheter,[facility]VS plans to move catheter to IJ and place new R thigh AVG 2/27 per [facility]. 9. Hx thyroid goiter - s/p neck exploration & lymph node biopsy (benign) per Dr. Gerrit Friends 01/28/13, but may require thyroidectomy involving median sternotomy; on Synthroid.  [name] Broadus John, PA-C [facility] Pager [phone] 05/26/2013,11:20 AM  LOS: 7 days   I have seen and examined patient, discussed with PA and agree with assessment and plan as outlined above.  STable from renal standpoint, awaiting therapeutic INR.  HD today. Needs more aggressive UF, BP high and has not been down to dry wt (58 kg) since admission.  [name] MD pager [phone]    cell [phone] 05/26/2013, 12:28 PM    Labs: Basic Metabolic Panel:  Recent Labs Lab 05/20/13 2004 05/21/13 0548 05/23/13 0450  NA 129* 129* 131*  K 4.3 4.7 4.4  CL 90* 91* 91*  CO2 23 22 23   GLUCOSE 222* 313* 126*  BUN 25* 35* 46*  CREATININE 4.31* 5.04* 6.22*  CALCIUM 8.5 8.5 8.9  PHOS 4.6 5.2* 4.9*   Liver Function Tests:  Recent Labs Lab 05/20/13 2004 05/21/13 0548 05/23/13 0450  ALBUMIN 2.9* 2.7* 2.9*   CBC:  Recent Labs Lab 05/22/13 0436 05/23/13 0450 05/24/13 0445 05/25/13 1310 05/26/13 0420  WBC 6.6 8.2 5.9 5.4 15.1*  HGB 9.7* 9.3* 9.3* 9.3* 9.6*  HCT 30.8* 29.2* 28.7* 28.5* 29.3*  MCV 91.4 90.7 90.5 89.3 88.8  PLT 188 202 183 234 239   Blood Culture    Component Value Date/Time   SDES WOUND LEFT GROIN 04/16/2013 1145   SDES WOUND LEFT GROIN 04/16/2013 1145   SPECREQUEST SWAB OF LEFT GROIN WOUND PT ON VANCPMYCIN 04/16/2013 1145   SPECREQUEST SWAB ON LEFT GROIN WOUND PT ON VANCOMYCIN 04/16/2013 1145   CULT  Value: NO GROWTH 2 DAYS [facility] 04/16/2013 1145   CULT  Value: NO ANAEROBES ISOLATED [facility] 04/16/2013 1145   REPTSTATUS 04/19/2013 FINAL 04/16/2013 1145   REPTSTATUS 04/21/2013 FINAL 04/16/2013 1145    CBG:  Recent Labs Lab 05/26/13 0507  05/26/13 0538 05/26/13 0625 05/26/13 0753 05/26/13 0954  GLUCAP 341* 322* 242* 213* 312*   Studies/Results: No results found. Medications:   . amLODipine  10 mg Oral QHS  . cloNIDine  0.1 mg Oral TID  . darbepoetin (ARANESP) injection - DIALYSIS  60 mcg Intravenous Q Fri-HD  . docusate sodium  100 mg Oral BID  . enoxaparin (LOVENOX) injection  1 mg/kg Subcutaneous Q24H  . feeding supplement (NEPRO CARB STEADY)  237 mL Oral BID BM  . ferric gluconate (FERRLECIT/NULECIT) IV  125 mg Intravenous Q M,W,F-HD  . FLUoxetine  5 mg Oral Daily  . hydrALAZINE  10 mg Oral Q8H  . insulin aspart  0-9 Units Subcutaneous TID WC  . insulin glargine  4 Units Subcutaneous BID  . levothyroxine  168 mcg Oral QAC breakfast  . metoCLOPramide  5 mg Oral TID AC  . metoprolol  75 mg Oral BID  . pantoprazole  40 mg Oral Daily  . sevelamer carbonate  800 mg Oral TID WC  . simvastatin  5 mg Oral q1800  . sodium chloride  3 mL Intravenous Q12H  . temazepam  15 mg Oral Custom  . warfarin  15 mg Oral ONCE-1800  . Warfarin - Pharmacist Dosing Inpatient   Does not apply [phone]

## 2013-05-26 NOTE — Progress Notes (Addendum)
Hypoglycemic Event  CBG: 27  Treatment: 3 glucose tabs, 1 tube instant glucose, 15 GM carbohydrate snack, 15 GM gel, D50 IV 50 mL and Glucagon IM 1 mg   Symptoms: Sweaty, Shaky and Nervous/irritable  Follow-up CBG: Time:330 CBG Result:274  Possible Reasons for Event: Unknown  Comments/MD notified: Access HD catheter to administer D50 IV 50ml x2 per [place] MD. Claiborne Billingsallahan NP notified and will pass on to rounding team in AM. Wes RN from rapid present in room. Check CBG q30 min x2, q1 x 4. Notify MD and RRT if patient becomes symptomatic again.     Gilman Schmidtembrina, Zaul Hubers J  Remember to initiate Hypoglycemia Order Set & complete

## 2013-05-26 NOTE — Progress Notes (Signed)
ANTICOAGULATION CONSULT NOTE - Follow Up Consult  Pharmacy Consult for Lovenox/Warfarin Indication: DVTs  Allergies  Allergen Reactions  . Penicillins Other (See Comments)    Unknown reaction    Patient Measurements: Height: 6\' 2"  (188 cm) Weight: 136 lb 7.4 oz (61.9 kg) IBW/kg (Calculated) : 82.2  Vital Signs: Temp: 97.4 F (36.3 C) (02/09 0813) Temp src: Oral (02/09 0813) BP: 173/86 mmHg (02/09 0813) Pulse Rate: 108 (02/09 0813)  Labs:  Recent Labs  05/23/13 1500 05/23/13 2300  05/24/13 0445 05/25/13 1310 05/26/13 0420  HGB  --   --   < > 9.3* 9.3* 9.6*  HCT  --   --   --  28.7* 28.5* 29.3*  PLT  --   --   --  183 234 239  LABPROT  --   --   --  19.1* 16.5* 15.8*  INR  --   --   --  1.66* 1.37 1.29  HEPARINUNFRC 0.76* 0.39  --  0.37  --   --   < > = values in this interval not displayed.  Estimated Creatinine Clearance: 11.5 ml/min (by C-G formula based on Cr of 6.22).   Assessment: 58 [facility] M [facility] w/ new L distal subclavian DVT. Patient has prior history of multiple DVTS/PE on chronic coumadin at home (PTA takes 7.5mg  PO Daily, however has been off for ~1 mo). INR remains below goal at 1.29 so MD has ordered 15mg  of coumadin tonight which is double his previous home dose. Dose is being escalated quickly so will need to watch INR closely.   Goal of Therapy:  INR 2-3 Monitor platelets by anticoagulation protocol: Yes   Plan:  1. Coumadin 15mg  PO x 1 tonight per MD 2. Continue lovenox 60mg  SQ Q24H 3. F/u AM INR and CBC  Lysle Pearlachel Jameria Bradway, PharmD, BCPS Pager # [phone] 05/26/2013 8:37 AM

## 2013-05-27 DIAGNOSIS — E1129 Type 2 diabetes mellitus with other diabetic kidney complication: Secondary | ICD-10-CM | POA: Diagnosis present

## 2013-05-27 DIAGNOSIS — E1165 Type 2 diabetes mellitus with hyperglycemia: Secondary | ICD-10-CM

## 2013-05-27 DIAGNOSIS — IMO0002 Reserved for concepts with insufficient information to code with codable children: Secondary | ICD-10-CM | POA: Diagnosis present

## 2013-05-27 LAB — PROTIME-INR
INR: 1.67 — ABNORMAL HIGH (ref 0.00–1.49)
Prothrombin Time: 19.2 seconds — ABNORMAL HIGH (ref 11.6–15.2)

## 2013-05-27 LAB — GLUCOSE, CAPILLARY
GLUCOSE-CAPILLARY: 287 mg/dL — AB (ref 70–99)
GLUCOSE-CAPILLARY: 245 mg/dL — AB (ref 70–99)
Glucose-Capillary: 238 mg/dL — ABNORMAL HIGH (ref 70–99)
Glucose-Capillary: 207 mg/dL — ABNORMAL HIGH (ref 70–99)
Glucose-Capillary: 117 mg/dL — ABNORMAL HIGH (ref 70–99)

## 2013-05-27 LAB — CBC
HCT: 28.7 % — ABNORMAL LOW (ref 39.0–52.0)
Hemoglobin: 9.1 g/dL — ABNORMAL LOW (ref 13.0–17.0)
MCH: 28.7 pg (ref 26.0–34.0)
MCHC: 31.7 g/dL (ref 30.0–36.0)
MCV: 90.5 fL (ref 78.0–100.0)
PLATELETS: 231 10*3/uL (ref 150–400)
RBC: 3.17 MIL/uL — AB (ref 4.22–5.81)
RDW: 16 % — ABNORMAL HIGH (ref 11.5–15.5)
WBC: 6.8 10*3/uL (ref 4.0–10.5)

## 2013-05-27 MED ORDER — NEPRO/CARBSTEADY PO LIQD
237.0000 mL | ORAL | Status: DC | PRN
  Filled 2013-05-27: qty 237

## 2013-05-27 MED ORDER — HEPARIN SODIUM (PORCINE) 1000 UNIT/ML DIALYSIS: 1000.0000 [IU] | INTRAMUSCULAR | Status: DC | PRN

## 2013-05-27 MED ORDER — SODIUM CHLORIDE 0.9 % IV SOLN
100.0000 mL | INTRAVENOUS | Status: DC | PRN
Start: 2013-05-27 — End: 2013-05-27

## 2013-05-27 MED ORDER — INSULIN DETEMIR 100 UNIT/ML ~~LOC~~ SOLN: 8.0000 [IU] | Freq: Every day | SUBCUTANEOUS | Status: DC

## 2013-05-27 MED ORDER — SODIUM CHLORIDE 0.9 % IV SOLN: 100.0000 mL | INTRAVENOUS | Status: DC | PRN

## 2013-05-27 MED ORDER — ALTEPLASE 2 MG IJ SOLR
2.0000 mg | Freq: Once | INTRAMUSCULAR | Status: DC | PRN
  Filled 2013-05-27: qty 2

## 2013-05-27 MED ORDER — LIDOCAINE HCL (PF) 1 % IJ SOLN: 5.0000 mL | INTRAMUSCULAR | Status: DC | PRN

## 2013-05-27 MED ORDER — LIDOCAINE-PRILOCAINE 2.5-2.5 % EX CREA
1.0000 "application " | TOPICAL_CREAM | CUTANEOUS | Status: DC | PRN
  Filled 2013-05-27: qty 5

## 2013-05-27 MED ORDER — WARFARIN SODIUM 7.5 MG PO TABS
15.0000 mg | ORAL_TABLET | Freq: Once | ORAL | Status: AC
  Administered 2013-05-27: 15 mg via ORAL
  Filled 2013-05-27: qty 2

## 2013-05-27 MED ORDER — PENTAFLUOROPROP-TETRAFLUOROETH EX AERO: 1.0000 "application " | INHALATION_SPRAY | CUTANEOUS | Status: DC | PRN

## 2013-05-27 MED ORDER — METOPROLOL TARTRATE 100 MG PO TABS
100.0000 mg | ORAL_TABLET | Freq: Two times a day (BID) | ORAL | Status: DC
  Administered 2013-05-27 – 2013-05-28 (×2): 100 mg via ORAL
  Filled 2013-05-27 (×3): qty 1

## 2013-05-27 MED ORDER — INSULIN DETEMIR 100 UNIT/ML ~~LOC~~ SOLN
8.0000 [IU] | Freq: Every day | SUBCUTANEOUS | Status: DC
  Administered 2013-05-28: 8 [IU] via SUBCUTANEOUS
  Filled 2013-05-27: qty 0.08

## 2013-05-27 MED ORDER — HYDROCODONE-ACETAMINOPHEN 5-325 MG PO TABS
1.0000 | ORAL_TABLET | ORAL | Status: DC | PRN
  Administered 2013-05-27 – 2013-05-28 (×3): 1 via ORAL
  Filled 2013-05-27 (×3): qty 1

## 2013-05-27 MED ORDER — ALTEPLASE 100 MG IV SOLR
6.0000 mg | Freq: Once | INTRAVENOUS | Status: AC
  Administered 2013-05-27: 6 mg
  Filled 2013-05-27 (×2): qty 6

## 2013-05-27 MED ORDER — MORPHINE SULFATE 2 MG/ML IJ SOLN
INTRAMUSCULAR | Status: AC
  Filled 2013-05-27: qty 1

## 2013-05-27 MED ORDER — INSULIN ASPART 100 UNIT/ML ~~LOC~~ SOLN: 1.0000 [IU] | Freq: Three times a day (TID) | SUBCUTANEOUS | Status: DC

## 2013-05-27 NOTE — Progress Notes (Signed)
ANTICOAGULATION CONSULT NOTE - Follow Up Consult  Pharmacy Consult for Lovenox/Warfarin Indication: DVTs  Allergies  Allergen Reactions  . Penicillins Other (See Comments)    Unknown reaction    Patient Measurements: Height: 6\' 2"  (188 cm) Weight: 132 lb 4.4 oz (60 kg) IBW/kg (Calculated) : 82.2  Vital Signs: Temp: 98.9 F (37.2 C) (02/10 0447) Temp src: Oral (02/10 0447) BP: 167/73 mmHg (02/10 0447) Pulse Rate: 84 (02/10 0447)  Labs:  Recent Labs  05/25/13 1310 05/26/13 0420 05/26/13 1258 05/26/13 1405 05/27/13 0434  HGB 9.3* 9.6*  --   --  9.1*  HCT 28.5* 29.3*  --   --  28.7*  PLT 234 239  --   --  231  LABPROT 16.5* 15.8*  --   --  19.2*  INR 1.37 1.29  --   --  1.67*  CREATININE  --   --  7.52* 5.23*  --     Estimated Creatinine Clearance: 13.2 ml/min (by C-G formula based on Cr of 5.23).   Assessment: 58 [facility] M [facility]/ new L distal subclavian DVT. Patient has prior history of multiple DVTS/PE on chronic coumadin at home (PTA takes 7.5mg  PO Daily, however has been off for ~1 mo). INR remains below goal at 1.67 but now starting to increase. H/H and plts are stable, no bleeding noted.   Goal of Therapy:  INR 2-3 Monitor platelets by anticoagulation protocol: Yes   Plan:  1. Repeat Coumadin 15mg  PO x 1 tonight  2. Continue lovenox 60mg  SQ Q24H 3. F/u AM INR and CBC  Lysle Pearlachel Tarina Volk, PharmD, BCPS Pager # [phone] 05/27/2013 8:43 AM

## 2013-05-27 NOTE — Progress Notes (Signed)
[facility] PROGRESS NOTE  Marcus JacksonCharles [name] ZOX:[id number] DOB: 07-14-1955 DOA: 05/19/2013 PCP: Galvin ProfferHAGUE, [name], MD  Marcus [name] is a 58 y.o. male with a history listed above but significant for multiple DVTs, PEs, CVAs and ESRD who dialyzes normally on M/W/F. [name] presented to the [facility] ER with left upper extremity pain. Ultrasound at [facility] showed a left distal subclavian DVT. [name] reports that he hasn't taken his coumadin for approximately 1 month because he could not get an order for it. He has been compliant with his other medications including insulin. He reportedly has been to the ER 5x last week with altered mental status due to hypoglycemia. Further [name] has recurrent problems with dialysis access. He had an infected graft removed from his left groin on 12/31. On exam this area is still tender to palpation and mildly indurated. He is currently using a right femoral catheter for HD access  Assessment/Plan: Acute Left Upper Extremity DVT  INR increasing. Give 15 mg coumadin again tonight. Hopefully home tomorrow of stable  Recurrent Hypoglycemia in Type 1 DM  No lows last night, but CBG above 600! Will d/c lantus altogether. Change to levemir. 8 qam only. Adjust SSI, utrasensitive.   Nausea  on reglan -improved  Severe malnutrition -dietician  ESRD  Appreciate renal consultation  Scheduled dialysis is M/W/F in .   Hyperkalemia  resolved   uncontrolled hypertension  Remains high. 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  Seen by vascular  Hypothyroidism  Recent history of elevated TSH.  Synthroid dose was adjusted 12/31.  -does not appear to have been taking medications   Code Status: full Family Communication: patient Disposition Plan: home with PT     Consultants:  Vascular  renal  Procedures:    Antibiotics:    HPI/Subjective: Wants to go home. C/o back pain  Objective: Filed Vitals:   05/27/13 1306  BP: 168/79  Pulse: 120  Temp: 98.6 F (37 C)  Resp: 22    Intake/Output Summary (Last 24 hours) at 05/27/13 1408 Last data filed at 05/27/13 1017  Gross per 24 hour  Intake    360 ml  Output   5000 ml  Net  -4640 ml   Filed Weights   05/26/13 1223 05/26/13 1705 05/27/13 1020  Weight: 65.9 kg (145 lb 4.5 oz) 60 kg (132 lb 4.4 oz) 61.9 kg (136 lb 7.4 oz)    Exam:   General:  Alert, oriented  Cardiovascular: rrr without WRR  Respiratory: clear anterior  Abdomen: +BS, soft  Musculoskeletal: dressing on left leg with dried blood. Right hand less swollen  Data Reviewed: Basic Metabolic Panel:  Recent Labs Lab 05/20/13 2004 05/21/13 0548 05/23/13 0450 05/26/13 1258 05/26/13 1405  NA 129* 129* 131* 126* 130*  K 4.3 4.7 4.4 >7.7* 4.0  CL 90* 91* 91* 87* 90*  CO2 23 22 23 22 24   GLUCOSE 222* 313* 126* 317* 234*  BUN 25* 35* 46* 72* 50*  CREATININE 4.31* 5.04* 6.22* 7.52* 5.23*  CALCIUM 8.5 8.5 8.9 8.7 8.6  PHOS 4.6 5.2* 4.9* 6.1* 3.3   Liver Function Tests:  Recent Labs Lab 05/20/13 2004 05/21/13 0548 05/23/13 0450 05/26/13 1258 05/26/13 1405  ALBUMIN 2.9* 2.7* 2.9* 2.7* 2.9*   No results found for this basename: LIPASE, AMYLASE,  in the last 168 hours No results found for this basename: AMMONIA,  in the last 168 hours CBC:  Recent Labs Lab 05/23/13 0450 05/24/13 0445 05/25/13 1310 05/26/13 0420 05/27/13 0434  WBC 8.2 5.9 5.4 15.1* 6.8  HGB 9.3* 9.3* 9.3* 9.6* 9.1*  HCT 29.2* 28.7* 28.5* 29.3* 28.7*  MCV 90.7 90.5 89.3 88.8 90.5  PLT 202 183 234 239 231   Cardiac Enzymes: No results found for this basename: CKTOTAL, CKMB, CKMBINDEX, TROPONINI,  in the last 168 hours BNP (last 3 results) No results found for this basename: PROBNP,  in the last 8760 hours CBG:  Recent  Labs Lab 05/26/13 1331 05/26/13 1434 05/26/13 1831 05/26/13 2137 05/27/13 0749  GLUCAP 238* 207* 191* 242* >600*    No results found for this or any previous visit (from the past 240 hour(s)).   Studies: No results found.  Scheduled Meds: . amLODipine  10 mg Oral QHS  . calcium acetate  1,334 mg Oral TID WC  . cloNIDine  0.1 mg Oral TID  . darbepoetin (ARANESP) injection - DIALYSIS  60 mcg Intravenous Q Fri-HD  . docusate sodium  100 mg Oral BID  . doxercalciferol  2 mcg Intravenous Q M,W,F-HD  . enoxaparin (LOVENOX) injection  1 mg/kg Subcutaneous Q24H  . feeding supplement (NEPRO CARB STEADY)  237 mL Oral BID BM  . ferric gluconate (FERRLECIT/NULECIT) IV  125 mg Intravenous Q M,W,F-HD  . FLUoxetine  5 mg Oral Daily  . hydrALAZINE  10 mg Oral Q8H  . insulin aspart  1-5 Units Subcutaneous TID WC  . [START ON 05/28/2013] insulin detemir  8 Units Subcutaneous Daily  . levothyroxine  168 mcg Oral QAC breakfast  . metoCLOPramide  5 mg Oral TID AC  . metoprolol  100 mg Oral BID  . pantoprazole  40 mg Oral Daily  . sevelamer carbonate  800 mg Oral TID WC  . simvastatin  5 mg Oral q1800  . sodium chloride  3 mL Intravenous Q12H  . temazepam  15 mg Oral Custom  . warfarin  15 mg Oral ONCE-1800  . Warfarin - Pharmacist Dosing Inpatient   Does not apply q1800   Continuous Infusions:    Time spent: 35 min  Himani Corona L  [facility] Pager [phone]. If 7PM-7AM, please contact night-coverage at [email or web address], password [facility] 05/27/2013, 2:08 PM  LOS: 8 days

## 2013-05-27 NOTE — Progress Notes (Signed)
NUTRITION FOLLOW-UP  DOCUMENTATION CODES Per approved criteria  -Severe malnutrition in the context of chronic illness -Underweight   INTERVENTION: Continue Nepro Shake po BID, each supplement provides 425 kcal and 19 grams protein. RD to continue to follow nutrition care plan.  NUTRITION DIAGNOSIS: Increased nutrient needs related to HD and repletion as evidenced by estimated needs. Ongoing.  Goal: Intake to meet >90% of estimated nutrition needs.  Monitor:  weight trends, lab trends, I/O's, PO intake, supplement tolerance  ASSESSMENT: PMHx significant for CVA, GIB, ESRD (on HD), DM, CHF. Admitted with upper L extremity pain. Work-up reveals L distal subclavian DVT. Pt also reports that he has been to the ER 5 times last week 2/2 AMS from hypoglycemia.  Pt required removal of his right thigh graft 12/31 secondary to infection and currently dialyzes with a right femoral catheter. Has been seen by VVS recently with plans to place a new R thigh graft eventually.  Potassium and phosphorus are now WNL.  Currently ordered for Renal 80-90 diet; eating mostly 50-100% of meals.  Hypoglycemic events on 2/8 and 2/9. Most recent CBG is >600.  Pt meets criteria for severe MALNUTRITION in the context of chronic illness as evidenced by severe muscle mass loss and <75% intake x at least 1 month.   Height: Ht Readings from Last 1 Encounters:  05/24/13 6\' 2"  (1.88 m)    Weight: Wt Readings from Last 1 Encounters:  05/26/13 132 lb 4.4 oz (60 kg)  60 kg - s/p HD 2/9  BMI:  Body mass index is 16.98 kg/(m^2).  Underweight  Estimated Nutritional Needs: Kcal: 1800 - 2000 Protein: 75 - 90 g Fluid: 1.2 liters  Skin: abrasion to L leg  Diet Order: Renal  EDUCATION NEEDS: -No education needs identified at this time   Intake/Output Summary (Last 24 hours) at 05/27/13 1011 Last data filed at 05/26/13 1705  Gross per 24 hour  Intake      0 ml  Output   5000 ml  Net  -5000 ml     Last BM: 2/7  Labs:   Recent Labs Lab 05/23/13 0450 05/26/13 1258 05/26/13 1405  NA 131* 126* 130*  K 4.4 >7.7* 4.0  CL 91* 87* 90*  CO2 23 22 24   BUN 46* 72* 50*  CREATININE 6.22* 7.52* 5.23*  CALCIUM 8.9 8.7 8.6  PHOS 4.9* 6.1* 3.3  GLUCOSE 126* 317* 234*    CBG (last 3)   Recent Labs  05/26/13 1831 05/26/13 2137 05/27/13 0749  GLUCAP 191* 242* >600*    Scheduled Meds: . amLODipine  10 mg Oral QHS  . calcium acetate  1,334 mg Oral TID WC  . cloNIDine  0.1 mg Oral TID  . darbepoetin (ARANESP) injection - DIALYSIS  60 mcg Intravenous Q Fri-HD  . docusate sodium  100 mg Oral BID  . doxercalciferol  2 mcg Intravenous Q M,W,F-HD  . enoxaparin (LOVENOX) injection  1 mg/kg Subcutaneous Q24H  . feeding supplement (NEPRO CARB STEADY)  237 mL Oral BID BM  . ferric gluconate (FERRLECIT/NULECIT) IV  125 mg Intravenous Q M,W,F-HD  . FLUoxetine  5 mg Oral Daily  . hydrALAZINE  10 mg Oral Q8H  . insulin aspart  0-9 Units Subcutaneous TID WC  . insulin glargine  6 Units Subcutaneous Daily  . levothyroxine  168 mcg Oral QAC breakfast  . metoCLOPramide  5 mg Oral TID AC  . metoprolol  75 mg Oral BID  . pantoprazole  40 mg Oral Daily  .  sevelamer carbonate  800 mg Oral TID WC  . simvastatin  5 mg Oral q1800  . sodium chloride  3 mL Intravenous Q12H  . temazepam  15 mg Oral Custom  . warfarin  15 mg Oral ONCE-1800  . Warfarin - Pharmacist Dosing Inpatient   Does not apply q1800    Continuous Infusions:    Marcus Motto MS, RD, LDN Pager: [phone] After-hours pager: [phone]

## 2013-05-27 NOTE — Procedures (Signed)
I was present at this dialysis session, have reviewed the session itself and made  appropriate changes  Vinson Moselleob Kingstin Heims MD (pgr) [phone]    [phone] 05/27/2013, 11:28 AM

## 2013-05-27 NOTE — Progress Notes (Signed)
PT Cancellation Note  Patient Details Name: Marcus JacksonCharles [name] MRN: [id number] DOB: Apr 10, 1956   Cancelled Treatment:    Reason Eval/Treat Not Completed: Patient declined, no reason specified; frustrated to not have gotten lunch.  Wants to eat prior to therapy, but also reports only got one hour dialysis due to machine issues and that has to go back for 2 more hours.  Will cancel and attempt to see tomorrow.   Conlee Sliter,CYNDI 05/27/2013, 3:09 PM

## 2013-05-27 NOTE — Progress Notes (Signed)
[facility] Progress Note  Subjective:   Looks and feels much better today. Eating breakfast.  Objective Filed Vitals:   05/26/13 1801 05/26/13 2139 05/27/13 0447 05/27/13 0900  BP: 164/84 177/82 167/73 174/88  Pulse: 81 89 84 108  Temp: 97.7 F (36.5 C) 99.3 F (37.4 C) 98.9 F (37.2 C) 98.7 F (37.1 C)  TempSrc: Oral Oral Oral Oral  Resp: 16 14 22 20   Height:      Weight:      SpO2: 100% 100% 100% 100%   Physical Exam General: Chronically ill-appearing, alert, cooperative, NAD Heart: RRR Lungs:CTA bilat, no wheezes/rhonchi Abdomen: Soft, NT, + BS Extremities: RUE swelling resolving some. No LE edema Dialysis Access: Rt fem TDC  Dialysis Orders: MWF / [facility]  4:15, 58 kg F160 2K/2.5Ca 450/A1.5 Heparin 0 R femoral catheter  Hectorol 0 Epogen 0 Venofer 100 mg x 10 (2/2 - 23)    Assessment/Plan: 1. DVT - @ L distal subclavian per US; Hx DVTs & PEs on chronic Coumadin but had not been able to obtain for the last month; now on Coumadin/lovenox bridge per pharmacy /needs outpt planning for anticoac before d/c. INR 1.67 2. Type 1 DM/ Recurrent hypoglycemia - Mgmt per primary. 3. ESRD - MWF, Extra tx  today via rt fem TDC for ^BP/vol xs 5. Hypertension/volume - SBPs 160s-170s on Amlodipine 10 mg qhs,Clonidine 0.941mTID, Metoprolol 75 mg bid and hydralazine 10 mg q 8hr. Net UF 5L yesterday. Still 2kg up. HD again today for BP mgmt.  6. Anemia - Hgb trending down 9.1 < 9.6. On Aranesp 60 q Fri. Fe Load initiated op on 2/2 x 10 doses. Will continue here. 7. Metabolic bone disease - Ca 8.6 (9.5 corrected) , P 3.3 on Phoslo 2 + Renvela 1 ac. Last PTH 459 > 56 in October. Not previously on Hectorol. Will start here at 2 mcg IV/HD and continue at d/c.  8. Nutrition - Alb 2.9, renal diet, multivitamin, nepro 9. Hx infected access - R thigh AVG removed 12/31, now with R femoral catheter,VVS plans to move catheter to IJ and place new R thigh AVG 2/27 per VVS. 10. Hx thyroid  goiter - s/p neck exploration & lymph node biopsy (benign) per Dr. Gerrit FriendsGerkin 01/28/13, but may require thyroidectomy involving median sternotomy; on Synthroid.  Scot JunKaren E. Broadus JohnWarren, PA-C [facility] Pager [phone] 05/27/2013,9:21 AM  LOS: 8 days   I have seen and examined patient, discussed with PA and agree with assessment and plan as outlined above.  Extra dialysis today for volume challenge. HD again tomorrow to keep on schedule if still here. INR not at goal yet. Vinson Moselleob Kolby Myung MD pager [phone]    cell [phone] 05/27/2013, 10:44 AM     Additional Objective Labs: Basic Metabolic Panel:  Recent Labs Lab 05/23/13 0450 05/26/13 1258 05/26/13 1405  NA 131* 126* 130*  K 4.4 >7.7* 4.0  CL 91* 87* 90*  CO2 23 22 24   GLUCOSE 126* 317* 234*  BUN 46* 72* 50*  CREATININE 6.22* 7.52* 5.23*  CALCIUM 8.9 8.7 8.6  PHOS 4.9* 6.1* 3.3   Liver Function Tests:  Recent Labs Lab 05/23/13 0450 05/26/13 1258 05/26/13 1405  ALBUMIN 2.9* 2.7* 2.9*   CBC:  Recent Labs Lab 05/23/13 0450 05/24/13 0445 05/25/13 1310 05/26/13 0420 05/27/13 0434  WBC 8.2 5.9 5.4 15.1* 6.8  HGB 9.3* 9.3* 9.3* 9.6* 9.1*  HCT 29.2* 28.7* 28.5* 29.3* 28.7*  MCV 90.7 90.5 89.3 88.8 90.5  PLT 202  183 234 239 231   Blood Culture    Component Value Date/Time   SDES WOUND LEFT GROIN 04/16/2013 1145   SDES WOUND LEFT GROIN 04/16/2013 1145   SPECREQUEST SWAB OF LEFT GROIN WOUND PT ON VANCPMYCIN 04/16/2013 1145   SPECREQUEST SWAB ON LEFT GROIN WOUND PT ON VANCOMYCIN 04/16/2013 1145   CULT  Value: NO GROWTH 2 DAYS Performed at [facility] 04/16/2013 1145   CULT  Value: NO ANAEROBES ISOLATED Performed at [facility] 04/16/2013 1145   REPTSTATUS 04/19/2013 FINAL 04/16/2013 1145   REPTSTATUS 04/21/2013 FINAL 04/16/2013 1145    CBG:  Recent Labs Lab 05/26/13 1331 05/26/13 1434 05/26/13 1831 05/26/13 2137 05/27/13 0749  GLUCAP 238* 207* 191* 242* >600*    Studies/Results: No results found. Medications:   . amLODipine  10 mg Oral QHS  . calcium acetate  1,334 mg Oral TID WC  . cloNIDine  0.1 mg Oral TID  . darbepoetin (ARANESP) injection - DIALYSIS  60 mcg Intravenous Q Fri-HD  . docusate sodium  100 mg Oral BID  . doxercalciferol  2 mcg Intravenous Q M,W,F-HD  . enoxaparin (LOVENOX) injection  1 mg/kg Subcutaneous Q24H  . feeding supplement (NEPRO CARB STEADY)  237 mL Oral BID BM  . ferric gluconate (FERRLECIT/NULECIT) IV  125 mg Intravenous Q M,W,F-HD  . FLUoxetine  5 mg Oral Daily  . hydrALAZINE  10 mg Oral Q8H  . insulin aspart  0-9 Units Subcutaneous TID WC  . insulin glargine  6 Units Subcutaneous Daily  . levothyroxine  168 mcg Oral QAC breakfast  . metoCLOPramide  5 mg Oral TID AC  . metoprolol  75 mg Oral BID  . pantoprazole  40 mg Oral Daily  . sevelamer carbonate  800 mg Oral TID WC  . simvastatin  5 mg Oral q1800  . sodium chloride  3 mL Intravenous Q12H  . temazepam  15 mg Oral Custom  . warfarin  15 mg Oral ONCE-1800  . Warfarin - Pharmacist Dosing Inpatient   Does not apply [phone]

## 2013-05-28 DIAGNOSIS — I82A19 Acute embolism and thrombosis of unspecified axillary vein: Secondary | ICD-10-CM

## 2013-05-28 DIAGNOSIS — E1165 Type 2 diabetes mellitus with hyperglycemia: Secondary | ICD-10-CM

## 2013-05-28 DIAGNOSIS — E1129 Type 2 diabetes mellitus with other diabetic kidney complication: Secondary | ICD-10-CM

## 2013-05-28 LAB — CBC
HCT: 28.6 % — ABNORMAL LOW (ref 39.0–52.0)
Hemoglobin: 9.1 g/dL — ABNORMAL LOW (ref 13.0–17.0)
MCH: 28.9 pg (ref 26.0–34.0)
MCHC: 31.8 g/dL (ref 30.0–36.0)
MCV: 90.8 fL (ref 78.0–100.0)
PLATELETS: 212 10*3/uL (ref 150–400)
RBC: 3.15 MIL/uL — ABNORMAL LOW (ref 4.22–5.81)
RDW: 16.3 % — AB (ref 11.5–15.5)
WBC: 6.8 10*3/uL (ref 4.0–10.5)

## 2013-05-28 LAB — BASIC METABOLIC PANEL
BUN: 33 mg/dL — ABNORMAL HIGH (ref 6–23)
CALCIUM: 9.2 mg/dL (ref 8.4–10.5)
CO2: 23 mEq/L (ref 19–32)
Chloride: 96 mEq/L (ref 96–112)
Creatinine, Ser: 4.21 mg/dL — ABNORMAL HIGH (ref 0.50–1.35)
GFR calc Af Amer: 17 mL/min — ABNORMAL LOW (ref >90)
GFR, EST NON AFRICAN AMERICAN: 14 mL/min — AB (ref >90)
Glucose, Bld: 189 mg/dL — ABNORMAL HIGH (ref 70–99)
Potassium: 4.7 mEq/L (ref 3.7–5.3)
SODIUM: 134 meq/L — AB (ref 137–147)

## 2013-05-28 LAB — PROTIME-INR
INR: 3.5 — ABNORMAL HIGH (ref 0.00–1.49)
PROTHROMBIN TIME: 33.8 s — AB (ref 11.6–15.2)

## 2013-05-28 LAB — GLUCOSE, CAPILLARY: Glucose-Capillary: 244 mg/dL — ABNORMAL HIGH (ref 70–99)

## 2013-05-28 MED ORDER — INSULIN DETEMIR 100 UNIT/ML ~~LOC~~ SOLN: 8.0000 [IU] | Freq: Every day | SUBCUTANEOUS | Status: DC

## 2013-05-28 MED ORDER — METOCLOPRAMIDE HCL 5 MG PO TABS: 5.0000 mg | ORAL_TABLET | Freq: Four times a day (QID) | ORAL | Status: DC | PRN

## 2013-05-28 MED ORDER — CYCLOBENZAPRINE HCL 5 MG PO TABS
5.0000 mg | ORAL_TABLET | Freq: Once | ORAL | Status: AC
  Administered 2013-05-28: 5 mg via ORAL
  Filled 2013-05-28: qty 1

## 2013-05-28 MED ORDER — WARFARIN SODIUM 5 MG PO TABS: 10.0000 mg | ORAL_TABLET | Freq: Every day | ORAL | Status: DC

## 2013-05-28 MED ORDER — METOPROLOL TARTRATE 50 MG PO TABS
75.0000 mg | ORAL_TABLET | Freq: Two times a day (BID) | ORAL | Status: DC
  Filled 2013-05-28: qty 1

## 2013-05-28 MED ORDER — INSULIN ASPART 100 UNIT/ML ~~LOC~~ SOLN: 2.0000 [IU] | Freq: Three times a day (TID) | SUBCUTANEOUS | Status: DC

## 2013-05-28 NOTE — Discharge Summary (Signed)
Physician Discharge Summary  Marcus JacksonCharles [name] ZOX:[id number] DOB: Jan 19, 1956 DOA: 05/19/2013  PCP: Marcus [name]  Admit date: 05/19/2013 Discharge date: 05/28/2013  Time spent: greater than 30 min  Recommendations for Outpatient Follow-up:  1. Monitor INR, adjust coumadin to keep INR between 2.0 and 3.0  Discharge Diagnoses:  Principal Problem:   Arm DVT (deep venous thromboembolism), acute Active Problems:   Diabetes mellitus, insulin dependent, uncontrolled   Hypoglycemia due to insulin   DM type 2, uncontrolled, with renal complications ESRD Hyperkalemia Malignant hypertension hypothyroidism  Discharge Condition: stable   Filed Weights   05/27/13 1714 05/28/13 0655 05/28/13 1004  Weight: 60.6 kg (133 lb 9.6 oz) 58.6 kg (129 lb 3 oz) 56.4 kg (124 lb 5.4 oz)    History of present illness:  58 y.o. male with a history listed above but significant for multiple DVTs, PEs, CVAs and ESRD who dialyzes normally on M/W/F. Marcus [name] presented to the [facility] ER with left upper extremity pain. Ultrasound at [facility] showed a left distal subclavian DVT. Marcus [name] reports that he hasn't taken his coumadin for approximately 1 month because he could not get a prescription for it. He has been compliant with his other medications including insulin. He reportedly has been to the ER 5x last week with altered mental status due to hypoglycemia. Further Marcus [name] has recurrent problems with dialysis access. He had an infected graft removed from his left groin on 12/31.   Hospital Course:  Started on heparin gtt and coumadin.  Therapeutic coumadin required escalating dosage, but at discharge >2.0.  Home health RN arranged to monitor INR.  Home PT arranged as well.  DM initially uncontrolled, but had multiple hypoglycemic episodes.  Long acting insulin switched to levemir.  dialized per usual schedule.  Procedures:  hemodialysis  Consultations:  Nephrology  Vascular  surgery  Discharge Exam: Filed Vitals:   05/28/13 1004  BP: 132/75  Pulse: 77  Temp: 98 F (36.7 C)  Resp: 18    General: alert, oriented Cardiovascular: RRR Respiratory: CTA  abd s, nt, nt Ext no CCE  Discharge Instructions  Discharge Orders   Future Orders Complete By Expires   Discharge instructions  As directed    Comments:     Monitor blood glucose before meals and before bedtime. Carbohydrate modified renal diet   Walk with assistance  As directed        Medication List    STOP taking these medications       aspirin EC 81 MG tablet     insulin glargine 100 UNIT/ML injection  Commonly known as:  LANTUS     promethazine 25 MG tablet  Commonly known as:  PHENERGAN      TAKE these medications       amLODipine 10 MG tablet  Commonly known as:  NORVASC  Take 1 tablet (10 mg total) by mouth at bedtime.     calcium acetate 667 MG capsule  Commonly known as:  PHOSLO  Take 1,334 mg by mouth 3 (three) times daily with meals.     cloNIDine 0.1 MG tablet  Commonly known as:  CATAPRES  Take 1 tablet (0.1 mg total) by mouth 3 (three) times daily.     docusate sodium 100 MG capsule  Commonly known as:  COLACE  Take 100 mg by mouth 2 (two) times daily.     FLUoxetine 10 MG tablet  Commonly known as:  PROZAC  Take 0.5 tablets (5 mg total) by  mouth every morning.     HYDROcodone-acetaminophen 5-325 MG per tablet  Commonly known as:  NORCO/VICODIN  Take 1-2 tablets by mouth every 4 (four) hours as needed for moderate pain.     insulin aspart 100 UNIT/ML injection  Commonly known as:  novoLOG  Inject 2 Units into the skin 3 (three) times daily before meals.     insulin detemir 100 UNIT/ML injection  Commonly known as:  LEVEMIR  Inject 0.08 mLs (8 Units total) into the skin daily.     levothyroxine 112 MCG tablet  Commonly known as:  SYNTHROID, LEVOTHROID  Take 1.5 tablets (168 mcg total) by mouth daily before breakfast.     metoCLOPramide 5 MG tablet   Commonly known as:  REGLAN  Take 1 tablet (5 mg total) by mouth every 6 (six) hours as needed for nausea.     metoprolol 50 MG tablet  Commonly known as:  LOPRESSOR  Take 1.5 tablets (75 mg total) by mouth 2 (two) times daily. Hold morning dose on dialysis days     omeprazole 20 MG capsule  Commonly known as:  PRILOSEC  Take 20 mg by mouth daily.     pravastatin 10 MG tablet  Commonly known as:  PRAVACHOL  Take 10 mg by mouth at bedtime.     PROAIR HFA 108 (90 BASE) MCG/ACT inhaler  Generic drug:  albuterol  Inhale 2 puffs into the lungs every 4 (four) hours as needed for wheezing or shortness of breath.     sevelamer carbonate 800 MG tablet  Commonly known as:  RENVELA  Take 800 mg by mouth 3 (three) times daily with meals.     temazepam 15 MG capsule  Commonly known as:  RESTORIL  Take 15 mg by mouth 4 (four) times a week. Monday, Wednesday, Friday, saturday     warfarin 5 MG tablet  Commonly known as:  COUMADIN  Take 2 tablets (10 mg total) by mouth daily. Or as instructed  Start taking on:  05/29/2013       Allergies  Allergen Reactions  . Penicillins Other (See Comments)    Unknown reaction       Follow-up Information   Follow up with Marcus [name] In 1 week.   Specialty:  Internal Medicine   Contact information:   [address] [place] [address] [phone]        The results of significant diagnostics from this hospitalization (including imaging, microbiology, ancillary and laboratory) are listed below for reference.    Significant Diagnostic Studies: US Soft Tissue Head/neck  05/23/2013   CLINICAL DATA:  Neck fullness. Thyromegaly on previous neck CT. History of thyroidectomy.  EXAM: THYROID ULTRASOUND  TECHNIQUE: Ultrasound examination of the thyroid gland and adjacent soft tissues was performed.  COMPARISON:  CT 05/13/2013  FINDINGS: Right thyroid lobe  Measurements: 86 x 46 x 31 mm. Heterogeneous echotexture with hyperemia on color  Doppler.  Left thyroid lobe  Measurements: 113 x 49 x 29 mm. Hyperemic; heterogeneous echotexture  Isthmus  Not discretely demarcated  Lymphadenopathy  None visualized.  IMPRESSION: Marked thyromegaly without dominant nodule or lesion.   Electronically Signed   By: Oley Balm M.D.   On: 05/23/2013 09:10   Dg Chest Port 1 View  05/19/2013   CLINICAL DATA:  Shortness of breath.  DVT.  EXAM: PORTABLE CHEST - 1 VIEW  COMPARISON:  CT ANGIO CHEST dated 05/19/2013; DG CHEST PORTABLE dated 05/15/2013  FINDINGS: 1304 hr. The heart  size and mediastinal contours are stable. There is superior mediastinal widening corresponding with thyromegaly on CT. Right brachiocephalic venous stent is noted, occluded on CT. Dialysis catheters are present within the right atrium, inserted inferiorly. The lungs are clear. There is stable blunting of the left costophrenic angle. No pleural effusion or pneumothorax is identified.  IMPRESSION: Stable postoperative findings with chronic blunting of the left costophrenic angle. No acute cardiopulmonary process.   Electronically Signed   By: Roxy Horseman M.D.   On: 05/19/2013 13:27    Microbiology: No results found for this or any previous visit (from the past 240 hour(s)).   Labs: Basic Metabolic Panel:  Recent Labs Lab 05/23/13 0450 05/26/13 1258 05/26/13 1405 05/28/13 0443  NA 131* 126* 130* 134*  K 4.4 >7.7* 4.0 4.7  CL 91* 87* 90* 96  CO2 23 22 24 23   GLUCOSE 126* 317* 234* 189*  BUN 46* 72* 50* 33*  CREATININE 6.22* 7.52* 5.23* 4.21*  CALCIUM 8.9 8.7 8.6 9.2  PHOS 4.9* 6.1* 3.3  --    Liver Function Tests:  Recent Labs Lab 05/23/13 0450 05/26/13 1258 05/26/13 1405  ALBUMIN 2.9* 2.7* 2.9*   No results found for this basename: LIPASE, AMYLASE,  in the last 168 hours No results found for this basename: AMMONIA,  in the last 168 hours CBC:  Recent Labs Lab 05/24/13 0445 05/25/13 1310 05/26/13 0420 05/27/13 0434 05/28/13 0443  WBC 5.9 5.4 15.1* 6.8  6.8  HGB 9.3* 9.3* 9.6* 9.1* 9.1*  HCT 28.7* 28.5* 29.3* 28.7* 28.6*  MCV 90.5 89.3 88.8 90.5 90.8  PLT 183 234 239 231 212   Cardiac Enzymes: No results found for this basename: CKTOTAL, CKMB, CKMBINDEX, TROPONINI,  in the last 168 hours BNP: BNP (last 3 results) No results found for this basename: PROBNP,  in the last 8760 hours CBG:  Recent Labs Lab 05/26/13 2137 05/27/13 0749 05/27/13 1255 05/27/13 1746 05/27/13 2139  GLUCAP 242* >600* 287* 117* 245*       Signed:  Takiesha Mcdevitt L  [facility] 05/28/2013, 11:27 AM

## 2013-05-28 NOTE — Progress Notes (Addendum)
Inpatient Diabetes Program Recommendations  AACE/ADA: New Consensus Statement on Inpatient Glycemic Control (2013)  Target Ranges:  Prepandial:   less than 140 mg/dL      Peak postprandial:   less than 180 mg/dL (1-2 hours)      Critically ill patients:  140 - 180 mg/dL     Results for [name], Marcus (MRN [id number]) as of 05/28/2013 09:57  Ref. Range 05/27/2013 07:49 05/27/2013 12:55 05/27/2013 17:46 05/27/2013 21:39  Glucose-Capillary Latest Range: 70-99 mg/dL >130>600 (HH) 865287 (H) 784117 (H) 245 (H)    Results for [name], Marcus [name] (MRN [id number]) as of 05/28/2013 09:57  Ref. Range 05/28/2013 04:43  Glucose Latest Range: 70-99 mg/dL 132189 (H)    **Patient with extremely labile CBGs.  Received 8 units total of Lantus on 02/08 and then had extreme hypoglycemia (CBG 28 mg/dl) on early AM of 44/0102/09.  Patient then only received 4 units Lantus total on 02/09 and woke up with extreme hyperglycemia on 02/10 (CBG >600 mg/dl).  Patient did receive 6 units Lantus yesterday (02/10).  Lab glucose this morning 189 mg/dl.  **Spoke with Dr. Lendell CapriceSullivan yesterday about patient's CBGs.  Dr. Lendell CapriceSullivan decided to switch patient to Levemir 8 units daily in the AM along with a very Sensitive SSI regimen.  Patient in HD again today.   Will follow and assist daily. Ambrose FinlandJeannine Johnston Seferina Brokaw RN, MSN, CDE Diabetes Coordinator Inpatient Diabetes Program Team Pager: [phone] (8a-10p)

## 2013-05-28 NOTE — Progress Notes (Signed)
[facility] Progress Note  Subjective:   Down to 56  Objective Filed Vitals:   05/28/13 0900 05/28/13 0930 05/28/13 1000 05/28/13 1004  BP: 131/78 131/79 92/56 132/75  Pulse: 72 75 75 77  Temp:    98 F (36.7 C)  TempSrc:    Oral  Resp:   18 18  Height:      Weight:    56.4 kg (124 lb 5.4 oz)  SpO2:       Physical Exam General: Chronically ill-appearing, alert, cooperative, NAD Heart: RRR Lungs:CTA bilat, no wheezes/rhonchi Abdomen: Soft, NT, + BS Extremities: UE edema completely resolved bilat Dialysis Access: Rt fem TDC  Dialysis Orders: MWF / [facility]  4:15, 58 kg F160 2K/2.5Ca 450/A1.5 Heparin 0 R femoral catheter  Hectorol 0 Epogen 0 Venofer 100 mg x 10 (2/2 - 23)    Assessment/Plan: 1. LUE DVT (L subclavian vein)- INR in range, for d/c home today 2. Type 1 DM 3. ESRD HD done already today 5. HTN / volume excess- finally down to dry wt, edema of both arms is completely resolved. BP is down, have changed BP regimen back to home meds PTA.  Lower dry wt to 56kg at discharge.  6. Anemia - Hgb trending down 9.1 < 9.6. On Aranesp 60 q Fri. Fe Load initiated op on 2/2 x 10 doses. Will continue here. 7. Metabolic bone disease - Ca 8.6 (9.5 corrected) , P 3.3 on Phoslo 2 + Renvela 1 ac. Last PTH 459 > 56 in October. Not previously on Hectorol. Will start here at 2 mcg IV/HD and continue at d/c.  8. Nutrition - Alb 2.9, renal diet, multivitamin, nepro 9. Hx infected access - R thigh AVG removed 12/31, now with R femoral catheter,VVS plans to move catheter to IJ and place new R thigh AVG 2/27 per [facility]VS. 10. Hx thyroid goiter  [name] MD pager [phone]    cell [phone] 05/28/2013, 11:39 AM     Additional Objective Labs: Basic Metabolic Panel:  Recent Labs Lab 05/23/13 0450 05/26/13 1258 05/26/13 1405 05/28/13 0443  NA 131* 126* 130* 134*  K 4.4 >7.7* 4.0 4.7  CL 91* 87* 90* 96  CO2 23 22 24 23   GLUCOSE 126* 317* 234* 189*  BUN 46* 72* 50* 33*   CREATININE 6.22* 7.52* 5.23* 4.21*  CALCIUM 8.9 8.7 8.6 9.2  PHOS 4.9* 6.1* 3.3  --    Liver Function Tests:  Recent Labs Lab 05/23/13 0450 05/26/13 1258 05/26/13 1405  ALBUMIN 2.9* 2.7* 2.9*   CBC:  Recent Labs Lab 05/24/13 0445 05/25/13 1310 05/26/13 0420 05/27/13 0434 05/28/13 0443  WBC 5.9 5.4 15.1* 6.8 6.8  HGB 9.3* 9.3* 9.6* 9.1* 9.1*  HCT 28.7* 28.5* 29.3* 28.7* 28.6*  MCV 90.5 89.3 88.8 90.5 90.8  PLT 183 234 239 231 212   Blood Culture    Component Value Date/Time   SDES WOUND LEFT GROIN 04/16/2013 1145   SDES WOUND LEFT GROIN 04/16/2013 1145   SPECREQUEST SWAB OF LEFT GROIN WOUND PT ON VANCPMYCIN 04/16/2013 1145   SPECREQUEST SWAB ON LEFT GROIN WOUND PT ON VANCOMYCIN 04/16/2013 1145   CULT  Value: NO GROWTH 2 DAYS Performed at [facility] 04/16/2013 1145   CULT  Value: NO ANAEROBES ISOLATED Performed at [facility] 04/16/2013 1145   REPTSTATUS 04/19/2013 FINAL 04/16/2013 1145   REPTSTATUS 04/21/2013 FINAL 04/16/2013 1145    CBG:  Recent Labs Lab 05/26/13 2137 05/27/13 0749 05/27/13 1255 05/27/13 1746 05/27/13  2139  GLUCAP 242* >600* 287* 117* 245*   Studies/Results: No results found. Medications:   . amLODipine  10 mg Oral QHS  . calcium acetate  1,334 mg Oral TID WC  . cloNIDine  0.1 mg Oral TID  . cyclobenzaprine  5 mg Oral Once  . darbepoetin (ARANESP) injection - DIALYSIS  60 mcg Intravenous Q Fri-HD  . docusate sodium  100 mg Oral BID  . doxercalciferol  2 mcg Intravenous Q M,W,F-HD  . feeding supplement (NEPRO CARB STEADY)  237 mL Oral BID BM  . ferric gluconate (FERRLECIT/NULECIT) IV  125 mg Intravenous Q M,W,F-HD  . FLUoxetine  5 mg Oral Daily  . hydrALAZINE  10 mg Oral 3 times per day  . insulin aspart  1-5 Units Subcutaneous TID WC  . insulin detemir  8 Units Subcutaneous Daily  . levothyroxine  168 mcg Oral QAC breakfast  . metoCLOPramide  5 mg Oral TID AC  . metoprolol  100 mg Oral BID  . pantoprazole   40 mg Oral Daily  . sevelamer carbonate  800 mg Oral TID WC  . simvastatin  5 mg Oral q1800  . sodium chloride  3 mL Intravenous Q12H  . temazepam  15 mg Oral Once per day on Mon Wed Fri Sat  . Warfarin - Pharmacist Dosing Inpatient   Does not apply [phone]

## 2013-05-28 NOTE — Procedures (Signed)
I was present at this dialysis session, have reviewed the session itself and made  appropriate changes  Rob Ellory Khurana MD (pgr) [phone]    (c) [phone] 05/28/2013, 10:46 AM   

## 2013-05-28 NOTE — Progress Notes (Signed)
Physical Therapy Treatment Patient Details Name: Marcus [name] MRN: [id number] DOB: 26-Feb-1956 Today's Date: 05/28/2013 Time: 1435-1450 PT Time Calculation (min): 15 min  PT Assessment / Plan / Recommendation  History of Present Illness Marcus [name]  is a 58 y.o. male with a history listed above but significant for multiple DVTs, PEs, CVAs and ESRD who dialyzes normally on M/W/F.  [name] [facility] with left upper extremity pain. [facility] showed a left distal subclavian DVT.  [name] reports that he hasn't taken his coumadin for approximately 1 month because he could not get an order for it   PT Comments   Patient progressing with independence and prepared for d/c home today with intermittent help and HHPT.  Did educate on safety with ambulation in the home and community.  Follow Up Recommendations  Home health PT;Supervision - Intermittent           Equipment Recommendations  None recommended by PT       Frequency Min 3X/week   Progress towards PT Goals Progress towards PT goals: Progressing toward goals  Plan Current plan remains appropriate    Precautions / Restrictions Precautions Precautions: Fall Restrictions Weight Bearing Restrictions: No   Pertinent Vitals/Pain No pain complaints    Mobility  Bed Mobility Overal bed mobility: Modified Independent Transfers Equipment used: Rolling walker (2 wheeled) Transfers: Sit to/from Stand Sit to Stand: Modified independent (Device/Increase time) Ambulation/Gait Ambulation/Gait assistance: Supervision;Min guard Ambulation Distance (Feet): 300 Feet Assistive device: Rolling walker (2 wheeled) Gait Pattern/deviations: Step-through pattern;Steppage;Ataxic General Gait Details: initially lifting back legs of walker and needing assist for balance (didn't want to cause to walker to make noise,) then traded for walker with plastic caps on legs and able to walk wtih superivision     Exercises Other Exercises Other Exercises: discussed plans for walking in community with assist only and safety precautions for fall prevention     PT Goals (current goals can now be found in the care plan section)    Visit Information  Last PT Received On: 05/28/13 Assistance Needed: +1 History of Present Illness: Marcus [name]  is a 58 y.o. male with a history listed above but significant for multiple DVTs, PEs, CVAs and ESRD who dialyzes normally on M/W/F.  [name] [facility] with left upper extremity pain. [facility] showed a left distal subclavian DVT.  [name] reports that he hasn't taken his coumadin for approximately 1 month because he could not get an order for it    Subjective Data   Sister on her way to get me.   Cognition  Cognition Arousal/Alertness: Awake/alert Behavior During Therapy: WFL for tasks assessed/performed Overall Cognitive Status: Within Functional Limits for tasks assessed    Balance  Balance Overall balance assessment: History of Falls Sitting balance-Leahy Scale: Good Standing balance support: Bilateral upper extremity supported Standing balance-Leahy Scale: Poor Standing balance comment: needs UE support for balance  End of Session PT - End of Session Equipment Utilized During Treatment: Gait belt Activity Tolerance: Patient tolerated treatment well Patient left: in bed;with call bell/phone within reach   GP     [facility],CYNDI 05/28/2013, 3:26 PM Selmayndi Wynn, [place] CarolinaPT [phone] 05/28/2013

## 2013-05-28 NOTE — Progress Notes (Signed)
ANTICOAGULATION CONSULT NOTE - Follow Up Consult  Pharmacy Consult for Warfarin Indication: DVTs  Allergies  Allergen Reactions  . Penicillins Other (See Comments)    Unknown reaction    Patient Measurements: Height: 6\' 2"  (188 cm) Weight: 129 lb 3 oz (58.6 kg) (standing weight) IBW/kg (Calculated) : 82.2  Vital Signs: Temp: 97.8 F (36.6 C) (02/11 0655) Temp src: Oral (02/11 0655) BP: 160/95 mmHg (02/11 0830) Pulse Rate: 70 (02/11 0830)  Labs:  Recent Labs  05/26/13 0420 05/26/13 1258 05/26/13 1405 05/27/13 0434 05/28/13 0443  HGB 9.6*  --   --  9.1* 9.1*  HCT 29.3*  --   --  28.7* 28.6*  PLT 239  --   --  231 212  LABPROT 15.8*  --   --  19.2* 33.8*  INR 1.29  --   --  1.67* 3.50*  CREATININE  --  7.52* 5.23*  --  4.21*    Estimated Creatinine Clearance: 16 ml/min (by C-G formula based on Cr of 4.21).   Assessment: 58 y/o M [facility] w/ new L distal subclavian DVT. Patient has prior history of multiple DVTS/PE on chronic coumadin at home (PTA takes 7.5mg  PO Daily, however has been off for ~1 mo). INR has not jumped up and is supratherapeutic at 3.5, likely d/t aggressive dosing. Lovenox has been stopped. H/H and plts are stable, no bleeding noted.   Goal of Therapy:  INR 2-3   Plan:  1. No coumadin tonight 2. F/u AM INR  Lysle Pearlachel Meghanne Pletz, PharmD, BCPS Pager # [phone] 05/28/2013 8:43 AM

## 2013-05-28 NOTE — Progress Notes (Signed)
   CARE MANAGEMENT NOTE 05/28/2013  Patient:  Marcus [name],Marcus [name]   Account Number:  [id number]  Date Initiated:  05/20/2013  Documentation initiated by:  ROYAL,CHERYL  Subjective/Objective Assessment:   Pt adm with K 6.4 and Glu 571, pt apparently not taking medications as ordered.     Action/Plan:   Will follow for progression and d/c planning. [facility]  05/23/2013 [facility] followup with home care providers re pt failure to take medications. Pt [facility] HH [facility] who adm the pt meds.   Anticipated DC Date:  05/28/2013   Anticipated DC Plan:  HOME W HOME HEALTH SERVICES         [facility]  Resumption Of Svcs/PTA Provider   Choice offered to / List presented to:             [facility]  [facility]   Status of service:  Completed, signed off Medicare Important Message given?   (If response is "NO", the following Medicare IM given date fields will be blank) Date Medicare IM given:   Date Additional Medicare IM given:    Discharge Disposition:  HOME W HOME HEALTH SERVICES  Per UR Regulation:    If discussed at Long Length of Stay Meetings, dates discussed:    Comments:  05/28/2013  [address] Trica Usery RN, [place]  [phone] Plan discharge today with resumption of home health orders. New diabetic regimen to include levemir. [facility]risten called to advise of plan discharge today and need for follow up at home with new medication regimen.  05/23/2013 [facility] followup and researched pt failure to take medications. Per their records the pt has the medications and his care giver give the pt these meds, by sitting them by the pt chair. However it is speculated that the pt remains noncompliant with actually taking the medication , this CM spoke wtih the pt at length re importance of taking his medications.   CRoyal RN MPH, case manager, [phone]

## 2013-05-29 LAB — GLUCOSE, CAPILLARY: Glucose-Capillary: 414 mg/dL — ABNORMAL HIGH (ref 70–99)

## 2013-06-06 ENCOUNTER — Other Ambulatory Visit: Payer: Self-pay

## 2013-06-10 ENCOUNTER — Encounter (HOSPITAL_COMMUNITY): Payer: Self-pay | Admitting: *Deleted

## 2013-06-10 NOTE — Progress Notes (Signed)
SDW call made, asked to call 2/25 in am to give instructions to patients care giver what medications to take day of surgery.

## 2013-06-10 NOTE — Progress Notes (Signed)
06/10/13 1644  OBSTRUCTIVE SLEEP APNEA  Have you ever been diagnosed with sleep apnea through a sleep study? No  Do you snore loudly (loud enough to be heard through closed doors)?  1  Do you often feel tired, fatigued, or sleepy during the daytime? 1  Has anyone observed you stop breathing during your sleep? 0  Do you have, or are you being treated for high blood pressure? 1  BMI more than 35 kg/m2? 0  Age over 58 years old? 1  Neck circumference greater than 40 cm/18 inches? 0  Gender: 1  Obstructive Sleep Apnea Score 5  Score 4 or greater  Results sent to PCP

## 2013-06-11 MED ORDER — VANCOMYCIN HCL IN DEXTROSE 1-5 GM/200ML-% IV SOLN: 1000.0000 mg | INTRAVENOUS | Status: DC

## 2013-06-11 NOTE — Progress Notes (Signed)
Called and spoke with Marcus [name], pt's caregiver and went over medications that pt can take in the am prior to surgery. She questioned whether we would be cancelling the surgery due to the weather. I told her no, that we would be open and the surgeons are able to get here. I told her if the patient/family felt that they could not make it in, that they needed to call Short Stay and let them know as early as they could in the am. I also told her that if they felt they needed to go ahead and cancel today, they needed to call Dr. Estanislado SpireBrabham's office. She voiced understanding.

## 2013-07-13 ENCOUNTER — Inpatient Hospital Stay (HOSPITAL_COMMUNITY): Payer: Medicare Other

## 2013-07-13 ENCOUNTER — Inpatient Hospital Stay (HOSPITAL_COMMUNITY)
Admission: AD | Admit: 2013-07-13 | Discharge: 2013-07-15 | DRG: 637 | Disposition: A | Discharge status: Home / Self Care | Payer: Medicare Other | Source: Other Acute Inpatient Hospital | Attending: Family Medicine | Admitting: Family Medicine

## 2013-07-13 ENCOUNTER — Encounter (HOSPITAL_COMMUNITY): Payer: Self-pay

## 2013-07-13 DIAGNOSIS — E079 Disorder of thyroid, unspecified: Secondary | ICD-10-CM

## 2013-07-13 DIAGNOSIS — Z794 Long term (current) use of insulin: Secondary | ICD-10-CM

## 2013-07-13 DIAGNOSIS — I10 Essential (primary) hypertension: Secondary | ICD-10-CM

## 2013-07-13 DIAGNOSIS — F3289 Other specified depressive episodes: Secondary | ICD-10-CM | POA: Diagnosis present

## 2013-07-13 DIAGNOSIS — Z992 Dependence on renal dialysis: Secondary | ICD-10-CM

## 2013-07-13 DIAGNOSIS — M949 Disorder of cartilage, unspecified: Secondary | ICD-10-CM

## 2013-07-13 DIAGNOSIS — T827XXA Infection and inflammatory reaction due to other cardiac and vascular devices, implants and grafts, initial encounter: Secondary | ICD-10-CM

## 2013-07-13 DIAGNOSIS — T383X5A Adverse effect of insulin and oral hypoglycemic [antidiabetic] drugs, initial encounter: Secondary | ICD-10-CM

## 2013-07-13 DIAGNOSIS — F329 Major depressive disorder, single episode, unspecified: Secondary | ICD-10-CM | POA: Diagnosis present

## 2013-07-13 DIAGNOSIS — D649 Anemia, unspecified: Secondary | ICD-10-CM | POA: Diagnosis present

## 2013-07-13 DIAGNOSIS — I82890 Acute embolism and thrombosis of other specified veins: Secondary | ICD-10-CM

## 2013-07-13 DIAGNOSIS — K5900 Constipation, unspecified: Secondary | ICD-10-CM | POA: Diagnosis present

## 2013-07-13 DIAGNOSIS — Z7901 Long term (current) use of anticoagulants: Secondary | ICD-10-CM

## 2013-07-13 DIAGNOSIS — N2581 Secondary hyperparathyroidism of renal origin: Secondary | ICD-10-CM | POA: Diagnosis present

## 2013-07-13 DIAGNOSIS — E039 Hypothyroidism, unspecified: Secondary | ICD-10-CM

## 2013-07-13 DIAGNOSIS — R791 Abnormal coagulation profile: Secondary | ICD-10-CM | POA: Diagnosis present

## 2013-07-13 DIAGNOSIS — E1065 Type 1 diabetes mellitus with hyperglycemia: Secondary | ICD-10-CM

## 2013-07-13 DIAGNOSIS — R519 Headache, unspecified: Secondary | ICD-10-CM | POA: Diagnosis present

## 2013-07-13 DIAGNOSIS — E1142 Type 2 diabetes mellitus with diabetic polyneuropathy: Secondary | ICD-10-CM | POA: Diagnosis present

## 2013-07-13 DIAGNOSIS — Z86718 Personal history of other venous thrombosis and embolism: Secondary | ICD-10-CM

## 2013-07-13 DIAGNOSIS — E1165 Type 2 diabetes mellitus with hyperglycemia: Secondary | ICD-10-CM

## 2013-07-13 DIAGNOSIS — I509 Heart failure, unspecified: Secondary | ICD-10-CM | POA: Diagnosis present

## 2013-07-13 DIAGNOSIS — E160 Drug-induced hypoglycemia without coma: Secondary | ICD-10-CM

## 2013-07-13 DIAGNOSIS — E871 Hypo-osmolality and hyponatremia: Secondary | ICD-10-CM

## 2013-07-13 DIAGNOSIS — I120 Hypertensive chronic kidney disease with stage 5 chronic kidney disease or end stage renal disease: Secondary | ICD-10-CM | POA: Diagnosis present

## 2013-07-13 DIAGNOSIS — I82409 Acute embolism and thrombosis of unspecified deep veins of unspecified lower extremity: Secondary | ICD-10-CM

## 2013-07-13 DIAGNOSIS — E119 Type 2 diabetes mellitus without complications: Secondary | ICD-10-CM

## 2013-07-13 DIAGNOSIS — IMO0002 Reserved for concepts with insufficient information to code with codable children: Secondary | ICD-10-CM

## 2013-07-13 DIAGNOSIS — E1129 Type 2 diabetes mellitus with other diabetic kidney complication: Secondary | ICD-10-CM

## 2013-07-13 DIAGNOSIS — Z48812 Encounter for surgical aftercare following surgery on the circulatory system: Secondary | ICD-10-CM

## 2013-07-13 DIAGNOSIS — E1101 Type 2 diabetes mellitus with hyperosmolarity with coma: Principal | ICD-10-CM

## 2013-07-13 DIAGNOSIS — N186 End stage renal disease: Secondary | ICD-10-CM

## 2013-07-13 DIAGNOSIS — J811 Chronic pulmonary edema: Secondary | ICD-10-CM

## 2013-07-13 DIAGNOSIS — I161 Hypertensive emergency: Secondary | ICD-10-CM

## 2013-07-13 DIAGNOSIS — E43 Unspecified severe protein-calorie malnutrition: Secondary | ICD-10-CM

## 2013-07-13 DIAGNOSIS — Z833 Family history of diabetes mellitus: Secondary | ICD-10-CM

## 2013-07-13 DIAGNOSIS — M899 Disorder of bone, unspecified: Secondary | ICD-10-CM | POA: Diagnosis present

## 2013-07-13 DIAGNOSIS — E049 Nontoxic goiter, unspecified: Secondary | ICD-10-CM

## 2013-07-13 DIAGNOSIS — Z8673 Personal history of transient ischemic attack (TIA), and cerebral infarction without residual deficits: Secondary | ICD-10-CM

## 2013-07-13 DIAGNOSIS — I82629 Acute embolism and thrombosis of deep veins of unspecified upper extremity: Secondary | ICD-10-CM

## 2013-07-13 DIAGNOSIS — D689 Coagulation defect, unspecified: Secondary | ICD-10-CM

## 2013-07-13 DIAGNOSIS — K219 Gastro-esophageal reflux disease without esophagitis: Secondary | ICD-10-CM | POA: Diagnosis present

## 2013-07-13 DIAGNOSIS — Z9981 Dependence on supplemental oxygen: Secondary | ICD-10-CM

## 2013-07-13 DIAGNOSIS — I82A11 Acute embolism and thrombosis of right axillary vein: Secondary | ICD-10-CM

## 2013-07-13 DIAGNOSIS — F17200 Nicotine dependence, unspecified, uncomplicated: Secondary | ICD-10-CM | POA: Diagnosis present

## 2013-07-13 DIAGNOSIS — R51 Headache: Secondary | ICD-10-CM

## 2013-07-13 DIAGNOSIS — E785 Hyperlipidemia, unspecified: Secondary | ICD-10-CM | POA: Diagnosis present

## 2013-07-13 LAB — CBC
HCT: 35.4 % — ABNORMAL LOW (ref 39.0–52.0)
HEMOGLOBIN: 12 g/dL — AB (ref 13.0–17.0)
MCH: 30.3 pg (ref 26.0–34.0)
MCHC: 33.9 g/dL (ref 30.0–36.0)
MCV: 89.4 fL (ref 78.0–100.0)
Platelets: 165 10*3/uL (ref 150–400)
RBC: 3.96 MIL/uL — AB (ref 4.22–5.81)
RDW: 15 % (ref 11.5–15.5)
WBC: 5.6 10*3/uL (ref 4.0–10.5)

## 2013-07-13 LAB — BASIC METABOLIC PANEL
BUN: 47 mg/dL — ABNORMAL HIGH (ref 6–23)
CHLORIDE: 87 meq/L — AB (ref 96–112)
CO2: 23 mEq/L (ref 19–32)
CREATININE: 6.76 mg/dL — AB (ref 0.50–1.35)
Calcium: 9.1 mg/dL (ref 8.4–10.5)
GFR calc non Af Amer: 8 mL/min — ABNORMAL LOW (ref >90)
GFR, EST AFRICAN AMERICAN: 9 mL/min — AB (ref >90)
Glucose, Bld: 116 mg/dL — ABNORMAL HIGH (ref 70–99)
POTASSIUM: 4.2 meq/L (ref 3.7–5.3)
Sodium: 127 mEq/L — ABNORMAL LOW (ref 137–147)

## 2013-07-13 LAB — HEMOGLOBIN A1C
Hgb A1c MFr Bld: 9.3 % — ABNORMAL HIGH (ref <5.7)
MEAN PLASMA GLUCOSE: 220 mg/dL — AB (ref <117)

## 2013-07-13 LAB — GLUCOSE, CAPILLARY
GLUCOSE-CAPILLARY: 267 mg/dL — AB (ref 70–99)
GLUCOSE-CAPILLARY: 177 mg/dL — AB (ref 70–99)
GLUCOSE-CAPILLARY: 299 mg/dL — AB (ref 70–99)
Glucose-Capillary: 99 mg/dL (ref 70–99)
Glucose-Capillary: 59 mg/dL — ABNORMAL LOW (ref 70–99)
Glucose-Capillary: 91 mg/dL (ref 70–99)
Glucose-Capillary: 72 mg/dL (ref 70–99)
Glucose-Capillary: 177 mg/dL — ABNORMAL HIGH (ref 70–99)
Glucose-Capillary: 182 mg/dL — ABNORMAL HIGH (ref 70–99)

## 2013-07-13 LAB — PROTIME-INR
INR: 1.39 (ref 0.00–1.49)
PROTHROMBIN TIME: 16.7 s — AB (ref 11.6–15.2)

## 2013-07-13 LAB — MRSA PCR SCREENING: MRSA by PCR: NEGATIVE

## 2013-07-13 MED ORDER — SODIUM CHLORIDE 0.9 % IV SOLN: 250.0000 mL | INTRAVENOUS | Status: DC | PRN

## 2013-07-13 MED ORDER — NA FERRIC GLUC CPLX IN SUCROSE 12.5 MG/ML IV SOLN
125.0000 mg | INTRAVENOUS | Status: DC
  Administered 2013-07-14: 125 mg via INTRAVENOUS
  Filled 2013-07-13 (×2): qty 10

## 2013-07-13 MED ORDER — WARFARIN - PHARMACIST DOSING INPATIENT
Freq: Every day | Status: DC
  Administered 2013-07-13: 19:00:00
  Administered 2013-07-15: 1

## 2013-07-13 MED ORDER — INSULIN ASPART 100 UNIT/ML ~~LOC~~ SOLN
0.0000 [IU] | SUBCUTANEOUS | Status: DC
  Administered 2013-07-13: 2 [IU] via SUBCUTANEOUS
  Administered 2013-07-13: 5 [IU] via SUBCUTANEOUS
  Administered 2013-07-13: 2 [IU] via SUBCUTANEOUS
  Administered 2013-07-14: 5 [IU] via SUBCUTANEOUS
  Administered 2013-07-14: 3 [IU] via SUBCUTANEOUS
  Administered 2013-07-14: 2 [IU] via SUBCUTANEOUS
  Administered 2013-07-14: 3 [IU] via SUBCUTANEOUS
  Administered 2013-07-15: 2 [IU] via SUBCUTANEOUS
  Administered 2013-07-15 (×2): 3 [IU] via SUBCUTANEOUS

## 2013-07-13 MED ORDER — SODIUM CHLORIDE 0.9 % IJ SOLN
3.0000 mL | Freq: Two times a day (BID) | INTRAMUSCULAR | Status: DC
  Administered 2013-07-13 – 2013-07-15 (×6): 3 mL via INTRAVENOUS

## 2013-07-13 MED ORDER — DEXTROSE-NACL 5-0.45 % IV SOLN
INTRAVENOUS | Status: DC
  Administered 2013-07-13: 04:00:00 via INTRAVENOUS

## 2013-07-13 MED ORDER — OXYCODONE HCL 5 MG PO TABS
5.0000 mg | ORAL_TABLET | ORAL | Status: DC | PRN
  Administered 2013-07-13 – 2013-07-15 (×3): 5 mg via ORAL
  Filled 2013-07-13 (×3): qty 1

## 2013-07-13 MED ORDER — SODIUM CHLORIDE 0.9 % IV SOLN
INTRAVENOUS | Status: DC
  Administered 2013-07-13: 1.2 [IU]/h via INTRAVENOUS
  Filled 2013-07-13: qty 1

## 2013-07-13 MED ORDER — HYDRALAZINE HCL 20 MG/ML IJ SOLN
10.0000 mg | Freq: Four times a day (QID) | INTRAMUSCULAR | Status: DC | PRN
  Administered 2013-07-13 – 2013-07-14 (×2): 10 mg via INTRAVENOUS
  Filled 2013-07-13 (×2): qty 1

## 2013-07-13 MED ORDER — RENA-VITE PO TABS
1.0000 | ORAL_TABLET | Freq: Every day | ORAL | Status: DC
  Administered 2013-07-13 – 2013-07-14 (×2): 1 via ORAL
  Filled 2013-07-13 (×3): qty 1

## 2013-07-13 MED ORDER — WARFARIN SODIUM 5 MG PO TABS
5.0000 mg | ORAL_TABLET | Freq: Once | ORAL | Status: AC
  Administered 2013-07-13: 5 mg via ORAL
  Filled 2013-07-13: qty 1

## 2013-07-13 MED ORDER — ACETAMINOPHEN 650 MG RE SUPP: 650.0000 mg | Freq: Four times a day (QID) | RECTAL | Status: DC | PRN

## 2013-07-13 MED ORDER — ACETAMINOPHEN 325 MG PO TABS
650.0000 mg | ORAL_TABLET | Freq: Four times a day (QID) | ORAL | Status: DC | PRN
  Administered 2013-07-13: 650 mg via ORAL
  Filled 2013-07-13: qty 2

## 2013-07-13 MED ORDER — ONDANSETRON HCL 4 MG/2ML IJ SOLN
4.0000 mg | Freq: Four times a day (QID) | INTRAMUSCULAR | Status: DC | PRN
  Administered 2013-07-14: 4 mg via INTRAVENOUS

## 2013-07-13 MED ORDER — ENOXAPARIN SODIUM 30 MG/0.3ML ~~LOC~~ SOLN: 30.0000 mg | SUBCUTANEOUS | Status: DC

## 2013-07-13 MED ORDER — CALCIUM ACETATE 667 MG PO CAPS
667.0000 mg | ORAL_CAPSULE | Freq: Three times a day (TID) | ORAL | Status: DC
  Administered 2013-07-13 – 2013-07-15 (×6): 667 mg via ORAL
  Filled 2013-07-13 (×8): qty 1

## 2013-07-13 MED ORDER — SODIUM CHLORIDE 0.9 % IJ SOLN: 3.0000 mL | INTRAMUSCULAR | Status: DC | PRN

## 2013-07-13 MED ORDER — SEVELAMER CARBONATE 800 MG PO TABS
800.0000 mg | ORAL_TABLET | Freq: Three times a day (TID) | ORAL | Status: DC
  Administered 2013-07-13 – 2013-07-14 (×3): 800 mg via ORAL
  Filled 2013-07-13 (×8): qty 1

## 2013-07-13 MED ORDER — DEXTROSE 50 % IV SOLN
25.0000 mL | INTRAVENOUS | Status: DC | PRN
  Administered 2013-07-13: 25 mL via INTRAVENOUS
  Filled 2013-07-13: qty 50

## 2013-07-13 MED ORDER — INSULIN REGULAR BOLUS VIA INFUSION
0.0000 [IU] | Freq: Three times a day (TID) | INTRAVENOUS | Status: DC
  Filled 2013-07-13: qty 10

## 2013-07-13 MED ORDER — HYDROMORPHONE HCL PF 1 MG/ML IJ SOLN
0.5000 mg | INTRAMUSCULAR | Status: DC | PRN
  Administered 2013-07-13: 1 mg via INTRAVENOUS
  Filled 2013-07-13: qty 1

## 2013-07-13 MED ORDER — ONDANSETRON HCL 4 MG PO TABS: 4.0000 mg | ORAL_TABLET | Freq: Four times a day (QID) | ORAL | Status: DC | PRN

## 2013-07-13 MED ORDER — HEPARIN (PORCINE) IN NACL 100-0.45 UNIT/ML-% IJ SOLN
1200.0000 [IU]/h | INTRAMUSCULAR | Status: DC
  Administered 2013-07-13: 1000 [IU]/h via INTRAVENOUS
  Administered 2013-07-14 – 2013-07-15 (×2): 1200 [IU]/h via INTRAVENOUS
  Filled 2013-07-13 (×4): qty 250

## 2013-07-13 MED ORDER — SODIUM CHLORIDE 0.45 % IV SOLN
INTRAVENOUS | Status: DC
  Administered 2013-07-13: 03:00:00 via INTRAVENOUS

## 2013-07-13 NOTE — Consult Note (Signed)
[facility] Renal Consultation Note    Indication for Consultation:  Management of ESRD/hemodialysis; anemia, hypertension/volume and secondary hyperparathyroidism  HPI: Marcus [name] is a 58 y.o. male ESRD patient with a complex medical history and frequent admissions who presented to the [facility] ED with complaints of headache and confusion x 3 days. Upon evaluation, he was found to be in hypertensive crisis with a blood glucose level > 700. CT head was negative for acute findings and he was subsequently transferred to [facility] for further management.  At the time of this encounter, he is somewhat lethargic but complains of diplopia x 2 weeks and hunger.  Blood pressure and glucose is stabilizing.   Past Medical History  Diagnosis Date  . Hypothyroidism   . Hyperlipidemia   . CVA (cerebrovascular accident)     hx CVA x 2  . GI bleed     2006, mallory-weiss tear  . Pulmonary edema     nov 2011, dry wt decreased  . Goiter     by CT Apr 2013  . Depression   . Cocaine abuse     past history  . Tobacco abuse     history of  . GERD (gastroesophageal reflux disease)   . Cataract   . Insomnia   . Chronic pain   . HTN (hypertension)     [place] Dr. Ardelle [place]  . Neuromuscular disorder     diabetic neuropathy  . Occlusion of carotid stent     Per patients nursing aid  . Pneumonia     hx of  . Diabetes mellitus     Sees Dr. Reather LittlerAjay Kumar [phone] -- fasting blood 70-110s  . DKA (diabetic ketoacidosis)     severe, Dec 2011  . History of blood transfusion   . On home oxygen therapy     2l nasal cannula at night  . Hemodialysis patient     M, W, F  . CHF (congestive heart failure)   . DVT (deep venous thrombosis)   . ESRD (end stage renal disease) on dialysis     hd - [facility] - [place]   Past Surgical History  Procedure Laterality Date  . Av fistula placement  03/25/2008    right forearm  . Av fistula placement  04/23/2012    Procedure: INSERTION OF  ARTERIOVENOUS (AV) GORE-TEX GRAFT THIGH;  Surgeon: Sherren Kernsharles E Fields, MD;  [facility];  Service: Vascular;  Laterality: Left;  . Arteriovenous graft placement Left     thigh  . Carotid stent insertion Right   . Dyalisis catheter    . Eye surgery Bilateral     cataracts  . Thyroid surgery  01/28/2013    Dr Gerrit FriendsGerkin  . Lymph node biopsy Right 01/28/2013    Procedure: LYMPH NODE BIOPSY;  Surgeon: Velora Hecklerodd M Gerkin, MD;  [facility];  Service: General;  Laterality: Right;  . Mass biopsy N/A 01/28/2013    Procedure: NECK EXPLORATION;  Surgeon: Velora Hecklerodd M Gerkin, MD;  [facility];  Service: General;  Laterality: N/A;  . Ligation arteriovenous gortex graft Right 02/27/2013    Procedure: LIGATION ARTERIOVENOUS GORTEX GRAFT;  Surgeon: Chuck Hinthristopher S Dickson, MD;  [facility];  Service: Vascular;  Laterality: Right;  . Ligation arteriovenous gortex graft Left 03/03/2013    Procedure: LIGATION Left ARTERIOVENOUS GORTEX Thigh GRAFT;  Surgeon: Fransisco HertzBrian L Chen, MD;  [facility];  Service: Vascular;  Laterality: Left;  . Insertion of dialysis catheter Right 03/03/2013  Procedure: INSERTION OF DIALYSIS CATHETER;  Surgeon: Fransisco Hertz, MD;  Location: [facility] OR;  Service: Vascular;  Laterality: Right;  . Av fistula placement Left 03/14/2013    Procedure: INSERTION OF  ARTERIOVENOUS (AV) GORE-TEX GRAFT  LEFT THIGH;  Surgeon: Larina Earthly, MD;  Location: [facility] OR;  Service: Vascular;  Laterality: Left;  . Avgg removal Left 04/16/2013    Procedure: REMOVAL OF ARTERIOVENOUS GORETEX GRAFT Left Thigh;  Surgeon: Nada Libman, MD;  Location: [facility] OR;  Service: Vascular;  Laterality: Left;  . Patch angioplasty Left 04/16/2013    Procedure: Patch Angioplasty of left femoral artery;  Surgeon: Nada Libman, MD;  Location: [facility] OR;  Service: Vascular;  Laterality: Left;   Family History  Problem Relation Age of Onset  . Diabetes Sister   . Diabetes Brother   . Hypertension Brother   . Hypertension Mother   .  Heart attack Mother   . Hypertension Father    Social History: Resides with his sister and her friend. Feels he is cared for adequately.  reports that he has been smoking Cigarettes.  He has been smoking about 0.25 packs per day. He has never used smokeless tobacco. He reports that he does not drink alcohol or use illicit drugs.  Allergies  Allergen Reactions  . Penicillins Other (See Comments)    Unknown reaction   Prior to Admission medications   Medication Sig Start Date End Date Taking? Authorizing Provider  amLODipine (NORVASC) 10 MG tablet Take 1 tablet (10 mg total) by mouth at bedtime. 04/21/13  Yes Esperanza Sheets, MD  calcium acetate (PHOSLO) 667 MG capsule Take 667 mg by mouth 3 (three) times daily with meals.    Yes Historical Provider, MD  cloNIDine (CATAPRES) 0.1 MG tablet Take 1 tablet (0.1 mg total) by mouth 3 (three) times daily. 04/21/13  Yes Esperanza Sheets, MD  docusate sodium (COLACE) 100 MG capsule Take 100 mg by mouth 2 (two) times daily.    Yes Historical Provider, MD  FLUoxetine (PROZAC) 10 MG tablet Take 0.5 tablets (5 mg total) by mouth every morning. 04/21/13  Yes Esperanza Sheets, MD  insulin aspart (NOVOLOG) 100 UNIT/ML injection Inject 0-5 Units into the skin 3 (three) times daily before meals.   Yes Historical Provider, MD  insulin glargine (LANTUS) 100 UNIT/ML injection Inject 8 Units into the skin 2 (two) times daily.   Yes Historical Provider, MD  metoCLOPramide (REGLAN) 5 MG tablet Take 1 tablet (5 mg total) by mouth every 6 (six) hours as needed for nausea. 05/28/13  Yes Christiane Ha, MD  metoprolol (LOPRESSOR) 50 MG tablet Take 1.5 tablets (75 mg total) by mouth 2 (two) times daily. Hold morning dose on dialysis days 04/21/13  Yes Esperanza Sheets, MD  omeprazole (PRILOSEC) 20 MG capsule Take 20 mg by mouth daily.   Yes Historical Provider, MD  pravastatin (PRAVACHOL) 10 MG tablet Take 10 mg by mouth at bedtime.   Yes Historical Provider, MD  PROAIR HFA  108 (90 BASE) MCG/ACT inhaler Inhale 2 puffs into the lungs every 4 (four) hours as needed for wheezing or shortness of breath.  12/08/12  Yes Historical Provider, MD  risperiDONE (RISPERDAL) 0.5 MG tablet Take 0.5 mg by mouth 2 (two) times daily as needed. 06/13/13  Yes Historical Provider, MD  sevelamer carbonate (RENVELA) 800 MG tablet Take 800 mg by mouth 3 (three) times daily with meals.  10/23/12  Yes Historical Provider, MD  temazepam (RESTORIL)  15 MG capsule Take 15 mg by mouth See admin instructions. Take on Monday, Wednesday, Friday, and Saturday night   Yes Historical Provider, MD  warfarin (COUMADIN) 7.5 MG tablet Take 7.5 mg by mouth daily.   Yes Historical Provider, MD  levothyroxine (SYNTHROID, LEVOTHROID) 112 MCG tablet Take 1.5 tablets (168 mcg total) by mouth daily before breakfast. 04/21/13   Esperanza Sheets, MD   Current Facility-Administered Medications  Medication Dose Route Frequency Provider Last Rate Last Dose  . 0.45 % sodium chloride infusion   Intravenous Continuous Harvette Velora Heckler, MD      . 0.9 %  sodium chloride infusion  250 mL Intravenous PRN Ron Parker, MD      . acetaminophen (TYLENOL) tablet 650 mg  650 mg Oral Q6H PRN Ron Parker, MD   650 mg at 07/13/13 1024   Or  . acetaminophen (TYLENOL) suppository 650 mg  650 mg Rectal Q6H PRN Ron Parker, MD      . dextrose 5 %-0.45 % sodium chloride infusion   Intravenous Continuous Ron Parker, MD 20 mL/hr at 07/13/13 0400    . dextrose 50 % solution 25 mL  25 mL Intravenous PRN Ron Parker, MD   25 mL at 07/13/13 0534  . hydrALAZINE (APRESOLINE) injection 10 mg  10 mg Intravenous Q6H PRN Ron Parker, MD   10 mg at 07/13/13 1024  . HYDROmorphone (DILAUDID) injection 0.5-1 mg  0.5-1 mg Intravenous Q3H PRN Ron Parker, MD      . insulin aspart (novoLOG) injection 0-9 Units  0-9 Units Subcutaneous 6 times per day Ron Parker, MD   2 Units at 07/13/13 1246  . insulin  regular (NOVOLIN R,HUMULIN R) 1 Units/mL in sodium chloride 0.9 % 100 mL infusion   Intravenous Continuous Ron Parker, MD   1.2 Units/hr at 07/13/13 0315  . insulin regular bolus via infusion 0-10 Units  0-10 Units Intravenous TID WC Harvette Velora Heckler, MD      . ondansetron [facility]) tablet 4 mg  4 mg Oral Q6H PRN Ron Parker, MD       Or  . ondansetron (ZOFRAN) injection 4 mg  4 mg Intravenous Q6H PRN Ron Parker, MD      . oxyCODONE (Oxy IR/ROXICODONE) immediate release tablet 5 mg  5 mg Oral Q4H PRN Ron Parker, MD   5 mg at 07/13/13 0307  . sodium chloride 0.9 % injection 3 mL  3 mL Intravenous Q12H Ron Parker, MD   3 mL at 07/13/13 1025  . sodium chloride 0.9 % injection 3 mL  3 mL Intravenous PRN Ron Parker, MD       Facility-Administered Medications Ordered in Other Encounters  Medication Dose Route Frequency Provider Last Rate Last Dose  . vancomycin (VANCOCIN) IVPB 1000 mg/200 mL premix  1,000 mg Intravenous 60 min Pre-Op Nada Libman, MD       Labs: Basic Metabolic Panel:  Recent Labs Lab 07/13/13 0545  NA 127*  K 4.2  CL 87*  CO2 23  GLUCOSE 116*  BUN 47*  CREATININE 6.76*  CALCIUM 9.1   CBC:  Recent Labs Lab 07/13/13 0545  WBC 5.6  HGB 12.0*  HCT 35.4*  MCV 89.4  PLT 165   CBG:  Recent Labs Lab 07/13/13 0422 07/13/13 0524 07/13/13 0549 07/13/13 0817 07/13/13 1233  GLUCAP 99 59* 91 72 177*   Studies/Results: No results found.  ROS:  Double vision, bilateral hand swelling. 10 pt ROS asked and answered. All other systems negative except as above.   Physical Exam: Filed Vitals:   07/13/13 0900 07/13/13 1000 07/13/13 1100 07/13/13 1200  BP: 166/94 200/113 133/63 169/89  Pulse: 80 80 86 84  Temp:    98 F (36.7 C)  TempSrc:      Resp: 11 10 12 10   Height:      Weight:      SpO2: 98% 100% 98% 98%     General: Chronically ill-appearing, lethargic, looks uncomfortable. Head: Normocephalic,  atraumatic, injected sclera bilaterally with clear drainage, mucus membranes are moist Neck: Supple. JVD not elevated. Lungs: Clear bilaterally to auscultation without wheezes, rales, or rhonchi. Breathing is unlabored. Heart: RRR with S1 S2. No murmurs, rubs, or gallops appreciated. Abdomen: Soft, non-tender, non-distended with normoactive bowel sounds. No rebound/guarding. No obvious abdominal masses. M-S:  Strength and tone appear normal for age. Extremities: Swelling to bilateral hands. Skin shiny/ ? Joint deformity but non-tender on exam. LEs with chronic skin changes. No edema to LE's Neuro: Lethargic but oriented X 3. Moves all extremities spontaneously. Psych:  Responds to questions appropriately with a normal affect. Dialysis Access: R femoral TDC/ exit site without exudate  Dialysis Orders: Center: [facility]  on MWF . EDW 56.5 kg HD Bath 2K/2.25Ca  Time 4:15 Heparin 0. Access Rt Fem TDC BFR 450 DFR 800   Hectorol 2 mcg IV/HD Epogen 0   Units IV/HD  Venofer  100 mg IV/HD Through 4/20 then 50 mg weekly  Recent labs: Hgb 11.7, Tsat 25%, P 5.9 and variable, PTH 459 in Jan  Assessment/Plan: 1. HHNC - mgmt per primary 2. Hypertensive Emergency/ Volume - SBPs 130s-160s. On PRN hydralazine for now. Up about 4kg here. Leaves ~0.5kg above edw consistently as outpatient. Lungs are clear. Bilat hand swelling concerning for volume vs arthritic changes. Will order plain films 3. Headaches/Diplopia - Possibly related to #2 above. Op MRI brain performed per notes. No results in EPIC. Defer mgmt to primary 4. ESRD -  MWF, HD tomorrow Near end-stage access w femoral Diatek 5. Anemia  - Hgb 12, consistent with op labs. No op ESAs. IV Fe load in progress through 4/20. Will continue. 6. Metabolic bone disease -  Ca 9.1. Last phos 5.9 and wildly variable op (likely due to ongoing illness). PTH 459 in January. Started on Hectorol 2 last admit, but no recent labs. Will recheck PTH . Continue op Phoslo +  Renvela. 7. Nutrition - Renal carb mod diet, multivitamin 8. Hx CVA 9. Hx Thyroid goiter 10. Quality of life/ Frequent admissions - Mr Geer has been struggling multiple health and access issues for quite some time and a conversation was initiated regarding his desire to continue dialysis.  He states that his treatments are going ok and he is not much bothered by them. Will defer discussion until such time that patient wishes to consider other alternatives  Claud Kelp, PA-C [facility] Pager [phone] 07/13/2013, 1:03 PM   Pt seen, examined, agree w assess/plan as above with additions as indicated.  [name] MD pager [phone]    cell [phone] 07/13/2013, 3:14 PM

## 2013-07-13 NOTE — H&P (Signed)
[facility] History and Physical  Marcus JacksonCharles Votaw EAV:[id number] DOB: 23-Mar-1956 DOA: 07/13/2013  Referring physician:  Dr. Littie DeedsGentry EDP at [facility] PCP: Galvin ProfferHAGUE, [name], MD  Specialists: Dr. Elvis CoilMartin Webb  Chief Complaint:  Headache and Confusion  HPI: Marcus [name] is a 58 y.o. male with multiple Medical Problems including ESRD on HD (MWF), DM2,  HTN,  DVT on Pradaxa Rx who presented to the ED at [facility] with complaints of Headache  X 3 days and today was noticed buy family to have confusion.  He was evaluated and found to be extremely hypertensive with a Blood pressure of 230 systolic and was found to have a glucose level of 713 however was nonketotic. A CT scan of the Head was performed and was found to be negative for acute findings.  He was administered IV Labetalol, and Insulin and placed on the Glucose Stabilizer and transferred to [facility] for further treatment.      Patient arrived on the Insulin drip with improvement in his glucose level and , also with high blood pressure but improvement from his initital readings at [facility].  He reports that he has had severe headaches for years, and they are becoming more frequent, he has had 6 episodes this month.   The headaches are throbbing and 10/10 and located behind the right eye.   He is not sure if they are migraine headaches, and he was to have an MRI of his Brain done as an outpatient and was to see a Dr. Georgiana ShoreLininger but he has not followed up yet.   He states that he has not missed any of his dialysis treatments, but he reports that he has not taken his medications in the last 24 hours due to his severe headache.  He denies having any nausea or vomiting or fevers or chills or meningeal symptoms.         Review of Systems:  Constitutional: No Weight Loss, No Weight Gain, Night Sweats, Fevers, Chills, Fatigue, or Generalized Weakness HEENT: +Headaches, Difficulty Swallowing,Tooth/Dental Problems,Sore Throat,  No  Sneezing, Rhinitis, Ear Ache, Nasal Congestion, or Post Nasal Drip,  Cardio-vascular:  No Chest pain, Orthopnea, PND, Edema in lower extremities, Anasarca, Dizziness, Palpitations  Resp: No Dyspnea, No DOE, No Productive Cough, No Non-Productive Cough, No Hemoptysis, No Change in Color of Mucus,  No Wheezing.    GI: No Heartburn, Indigestion, Abdominal Pain, Nausea, Vomiting, Diarrhea, Change in Bowel Habits,  Loss of Appetite  GU: No Dysuria, Change in Color of Urine, No Urgency or Frequency.  No flank pain.  Musculoskeletal: No Joint Pain or Swelling.  No Decreased Range of Motion. No Back Pain.  Neurologic: No Syncope, No Seizures, Muscle Weakness, Paresthesia, Vision Disturbance or Loss, No Diplopia, No Vertigo, No Difficulty Walking,  Skin: No Rash or Lesions. Psych: No Change in Mood or Affect. No Depression or Anxiety. No Memory loss. +Confusion or Hallucinations   Past Medical History  Diagnosis Date  . Hypothyroidism   . Hyperlipidemia   . CVA (cerebrovascular accident)     hx CVA x 2  . GI bleed     2006, mallory-weiss tear  . Pulmonary edema     nov 2011, dry wt decreased  . Goiter     by CT Apr 2013  . Depression   . Cocaine abuse     past history  . Tobacco abuse     history of  . GERD (gastroesophageal reflux disease)   . Cataract   .  Insomnia   . Chronic pain   . HTN (hypertension)     sees Dr. Ardelle Park, Rosalita Levan  . Neuromuscular disorder     diabetic neuropathy  . Occlusion of carotid stent     Per patients nursing aid  . Pneumonia     hx of  . Diabetes mellitus     Sees Dr. Reather Littler [phone] -- fasting blood 70-110s  . DKA (diabetic ketoacidosis)     severe, Dec 2011  . History of blood transfusion   . On home oxygen therapy     2l nasal cannula at night  . Hemodialysis patient     M, W, F  . CHF (congestive heart failure)   . DVT (deep venous thrombosis)   . ESRD (end stage renal disease) on dialysis     hd - mwf - [facility]      Past  Surgical History  Procedure Laterality Date  . Av fistula placement  03/25/2008    right forearm  . Av fistula placement  04/23/2012    Procedure: INSERTION OF ARTERIOVENOUS (AV) GORE-TEX GRAFT THIGH;  Surgeon: Sherren Kerns, MD;  Location: [facility] OR;  Service: Vascular;  Laterality: Left;  . Arteriovenous graft placement Left     thigh  . Carotid stent insertion Right   . Dyalisis catheter    . Eye surgery Bilateral     cataracts  . Thyroid surgery  01/28/2013    Dr Gerrit Friends  . Lymph node biopsy Right 01/28/2013    Procedure: LYMPH NODE BIOPSY;  Surgeon: Velora Heckler, MD;  Location: [facility] OR;  Service: General;  Laterality: Right;  . Mass biopsy N/A 01/28/2013    Procedure: NECK EXPLORATION;  Surgeon: Velora Heckler, MD;  Location: [facility] OR;  Service: General;  Laterality: N/A;  . Ligation arteriovenous gortex graft Right 02/27/2013    Procedure: LIGATION ARTERIOVENOUS GORTEX GRAFT;  Surgeon: Chuck Hint, MD;  Location: [facility] OR;  Service: Vascular;  Laterality: Right;  . Ligation arteriovenous gortex graft Left 03/03/2013    Procedure: LIGATION Left ARTERIOVENOUS GORTEX Thigh GRAFT;  Surgeon: Fransisco Hertz, MD;  Location: [facility] OR;  Service: Vascular;  Laterality: Left;  . Insertion of dialysis catheter Right 03/03/2013    Procedure: INSERTION OF DIALYSIS CATHETER;  Surgeon: Fransisco Hertz, MD;  Location: [facility] OR;  Service: Vascular;  Laterality: Right;  . Av fistula placement Left 03/14/2013    Procedure: INSERTION OF  ARTERIOVENOUS (AV) GORE-TEX GRAFT  LEFT THIGH;  Surgeon: Larina Earthly, MD;  Location: [facility] OR;  Service: Vascular;  Laterality: Left;  . Avgg removal Left 04/16/2013    Procedure: REMOVAL OF ARTERIOVENOUS GORETEX GRAFT Left Thigh;  Surgeon: Nada Libman, MD;  Location: [facility] OR;  Service: Vascular;  Laterality: Left;  . Patch angioplasty Left 04/16/2013    Procedure: Patch Angioplasty of left femoral artery;  Surgeon: Nada Libman, MD;  Location: [facility] OR;  Service: Vascular;   Laterality: Left;       Prior to Admission medications   Medication Sig Start Date End Date Taking? Authorizing Provider  amLODipine (NORVASC) 10 MG tablet Take 1 tablet (10 mg total) by mouth at bedtime. 04/21/13   Esperanza Sheets, MD  calcium acetate (PHOSLO) 667 MG capsule Take 1,334 mg by mouth 3 (three) times daily with meals.    Historical Provider, MD  cloNIDine (CATAPRES) 0.1 MG tablet Take 1 tablet (0.1 mg total) by mouth 3 (three) times daily. 04/21/13   Isaiah Serge  Buriev, MD  docusate sodium (COLACE) 100 MG capsule Take 100 mg by mouth 2 (two) times daily.     Historical Provider, MD  FLUoxetine (PROZAC) 10 MG tablet Take 0.5 tablets (5 mg total) by mouth every morning. 04/21/13   Esperanza Sheets, MD  insulin aspart (NOVOLOG) 100 UNIT/ML injection Inject 2 Units into the skin 3 (three) times daily before meals. 05/28/13   Christiane Ha, MD  insulin detemir (LEVEMIR) 100 UNIT/ML injection Inject 0.08 mLs (8 Units total) into the skin daily. 05/28/13   Christiane Ha, MD  levothyroxine (SYNTHROID, LEVOTHROID) 112 MCG tablet Take 1.5 tablets (168 mcg total) by mouth daily before breakfast. 04/21/13   Esperanza Sheets, MD  metoCLOPramide (REGLAN) 5 MG tablet Take 1 tablet (5 mg total) by mouth every 6 (six) hours as needed for nausea. 05/28/13   Christiane Ha, MD  metoprolol (LOPRESSOR) 50 MG tablet Take 1.5 tablets (75 mg total) by mouth 2 (two) times daily. Hold morning dose on dialysis days 04/21/13   Esperanza Sheets, MD  omeprazole (PRILOSEC) 20 MG capsule Take 20 mg by mouth daily.    Historical Provider, MD  OxyCODONE (OXYCONTIN) 10 mg T12A 12 hr tablet Take 10 mg by mouth every 12 (twelve) hours as needed (pain).    Historical Provider, MD  pravastatin (PRAVACHOL) 10 MG tablet Take 10 mg by mouth at bedtime.    Historical Provider, MD  PROAIR HFA 108 (90 BASE) MCG/ACT inhaler Inhale 2 puffs into the lungs every 4 (four) hours as needed for wheezing or shortness of breath.   12/08/12   Historical Provider, MD  sevelamer carbonate (RENVELA) 800 MG tablet Take 800 mg by mouth 3 (three) times daily with meals.  10/23/12   Historical Provider, MD  temazepam (RESTORIL) 15 MG capsule Take 15 mg by mouth 4 (four) times a week. Monday, Wednesday, Friday, saturday    Historical Provider, MD  warfarin (COUMADIN) 7.5 MG tablet Take 7.5 mg by mouth daily.    Historical Provider, MD      Allergies  Allergen Reactions  . Penicillins Other (See Comments)    Unknown reaction     Social History:   Lives with His Sister, Disabled  reports that he has been smoking Cigarettes.  He has been smoking about 0.25 packs per day. He has never used smokeless tobacco. He reports that he does not drink alcohol or use illicit drugs.     Family History  Problem Relation Age of Onset  . Diabetes Sister   . Diabetes Brother   . Hypertension Brother   . Hypertension Mother   . Heart attack Mother   . Hypertension Father        Physical Exam:  GEN:  Pleasant Well Nourished and Well Developed  58 y.o. African American  male  examined  and in no acute distress; cooperative with exam Filed Vitals:   07/13/13 0145 07/13/13 0200 07/13/13 0215 07/13/13 0230  BP: 189/99 174/91 158/93 162/82  Pulse: 76 76 79 78  Resp: 12 13 14 12   Height:  6\' 2"  (1.88 m)    Weight:  60.4 kg (133 lb 2.5 oz)    SpO2: 99% 99% 100% 98%   Blood pressure 162/82, pulse 78, resp. rate 12, height 6\' 2"  (1.88 m), weight 60.4 kg (133 lb 2.5 oz), SpO2 98.00%. PSYCH: He is alert and oriented x4; does not appear anxious does not appear depressed; affect is normal HEENT: Normocephalic and Atraumatic, Mucous  membranes pink; PERRLA; EOM intact; Fundi:  Benign;  No scleral icterus, Nares: Patent, Oropharynx: Clear, Edentulous, Neck:  FROM, no cervical lymphadenopathy nor thyromegaly or carotid bruit; no JVD; Breasts:: Not examined CHEST WALL: No tenderness CHEST: Normal respiration, clear to auscultation  bilaterally HEART: Regular rate and rhythm; no murmurs rubs or gallops BACK: No kyphosis or scoliosis; no CVA tenderness ABDOMEN: Positive Bowel Sounds, soft non-tender; no masses, no organomegaly Rectal Exam: Not done EXTREMITIES: No cyanosis, clubbing or edema; no ulcerations. Genitalia: not examined PULSES: 2+ and symmetric SKIN: Normal hydration no rash or ulceration CNS:  Alert and Oriented X 4, No Focal Deficits.    Vascular: pulses palpable throughout    Labs on Admission:  Basic Metabolic Panel: No results found for this basename: NA, K, CL, CO2, GLUCOSE, BUN, CREATININE, CALCIUM, MG, PHOS,  in the last 168 hours Liver Function Tests: No results found for this basename: AST, ALT, ALKPHOS, BILITOT, PROT, ALBUMIN,  in the last 168 hours No results found for this basename: LIPASE, AMYLASE,  in the last 168 hours No results found for this basename: AMMONIA,  in the last 168 hours CBC: No results found for this basename: WBC, NEUTROABS, HGB, HCT, MCV, PLT,  in the last 168 hours Cardiac Enzymes: No results found for this basename: CKTOTAL, CKMB, CKMBINDEX, TROPONINI,  in the last 168 hours  BNP (last 3 results) No results found for this basename: PROBNP,  in the last 8760 hours CBG: No results found for this basename: GLUCAP,  in the last 168 hours  Radiological Exams on Admission: No results found.    EKG: Independently reviewed.     Assessment/Plan:   58 y.o. male with  Principal Problem:   HHNC (hyperglycemic hyperosmolar nonketotic coma) Active Problems:   Hypertensive emergency, no CHF   Headache(784.0)   Hyponatremia   ESRD (end stage renal disease) on dialysis   Unspecified hypothyroidism   Coagulopathy    Metabolic Acidosis    Tobacco Use Disorder    1.   HHNC-   IV Insulin drip, Glucose Stabilizer,  Monitor electrolytes.   2.   Hypertensive Emergency- IV Hydralazine , and permissive hypertensive , because appears to be chronic Keep below 180  systolic.    3.   Headaches- due to #2, and/or ? Migraines.  Pain Control  PRN.     4.   Hyponatremia-   Monitor Electrolytes after shifting has occurred from Insulin Rx and correction of hyperglycemia.    May need to be corrected through Dialysis. Most Likely not compliant with a Fluid Restriction.     5.   ESRD on HD-  Notify Dialysis Team,  On Schedule MWF.     6.   Hypothyroid-   continue Levothyroxine and check TSH.    7.   Metabolic Acidosis-  Due to Renal Disease, and Possible Early DKA.    8.   Tobacco Use Disorder-  Counseled Re Smoking Cessation. Continue to decrease Cigarettes.    9.  Coagulopathy- on Pradaxa Rx , Hx of Rt Arm DVT.        Code Status:   FULL CODE    Family Communication:    No Family Present Disposition Plan:     Inpatient    Time spent: 94 Minutes  Ron Parker [facility] Pager [phone]  If 7PM-7AM, please contact night-coverage [email or web address] Password [facility] 07/13/2013, 2:38 AM

## 2013-07-13 NOTE — Progress Notes (Signed)
Subjective: Patient admitted this morning by Dr Lovell Sheehanjenkins, see H&P for details. Blood glucose is better. Filed Vitals:   07/13/13 0814  BP:   Pulse: 79  Temp: 97.7 F (36.5 C)  Resp: 11    Chest: Clear Bilaterally Heart : S1S2 RRR Abdomen: Soft, nontender Ext : No edema Neuro: Alert, oriented x 3  A/P HHNC- Off insulin drip, on SSI. Hypertensive emergency- BP better. Continue PRN Hydralazine,  ESRD- HD on MWF, Nephrology informed.   Meredeth IdeGagan S Cain Fitzhenry [facility] [phone]- [phone]

## 2013-07-13 NOTE — Progress Notes (Signed)
ANTICOAGULATION CONSULT NOTE - Initial Consult  Pharmacy Consult for Heparin/coumadin Indication: DVT  Allergies  Allergen Reactions  . Penicillins Other (See Comments)    Unknown reaction    Patient Measurements: Height: 6\' 2"  (188 cm) Weight: 133 lb 2.5 oz (60.4 kg) IBW/kg (Calculated) : 82.2 Heparin Dosing Weight: 60kg  Vital Signs: Temp: 97.8 F (36.6 C) (03/29 1600) Temp src: Oral (03/29 1600) BP: 165/96 mmHg (03/29 1600) Pulse Rate: 91 (03/29 1400)  Labs:  Recent Labs  07/13/13 0545  HGB 12.0*  HCT 35.4*  PLT 165  LABPROT 16.7*  INR 1.39  CREATININE 6.76*    Estimated Creatinine Clearance: 10.3 ml/min (by C-G formula based on Cr of 6.76).   Medical History: Past Medical History  Diagnosis Date  . Hypothyroidism   . Hyperlipidemia   . CVA (cerebrovascular accident)     hx CVA x 2  . GI bleed     2006, mallory-weiss tear  . Pulmonary edema     nov 2011, dry wt decreased  . Goiter     by CT Apr 2013  . Depression   . Cocaine abuse     past history  . Tobacco abuse     history of  . GERD (gastroesophageal reflux disease)   . Cataract   . Insomnia   . Chronic pain   . HTN (hypertension)     sees Dr. Ardelle [place]Haque, [place]  . Neuromuscular disorder     diabetic neuropathy  . Occlusion of carotid stent     Per patients nursing aid  . Pneumonia     hx of  . Diabetes mellitus     Sees Dr. Reather LittlerAjay Kumar [phone] -- fasting blood 70-110s  . DKA (diabetic ketoacidosis)     severe, Dec 2011  . History of blood transfusion   . On home oxygen therapy     2l nasal cannula at night  . Hemodialysis patient     M, W, F  . CHF (congestive heart failure)   . DVT (deep venous thrombosis)   . ESRD (end stage renal disease) on dialysis     hd - mwf - [place]    Assessment: [age] YOM with ESRD on HD who transferred from [facility] with headache, hypertensive emergency and confusion, he has history of DVT, per MD note and [place] record, he takes pradaxa 150  mg BID prior to admission. But per med rec, he takes coumadin PTA, pt. Is not a reliable historian, probably gave different answers about his anticoagulation. Looked through [facility] record, pt. [facility]'t [facility]. INR 1.39, could be d/t coumadin or pradaxa.   Goal of Therapy:  INR 2-3 Heparin level 0.3-0.7 units/ml Monitor platelets by anticoagulation protocol: Yes   Plan:  Heparin infusion 1000 units/hr Coumadin 5mg  po x 1 F/u 8 hr heparin level at 0200 F/u daily PT/INR, heparin level and CBC D/c heparin when INR > 2  Bayard HuggerMei Dangelo Guzzetta, PharmD, BCPS  Clinical Pharmacist  Pager: [phone]   07/13/2013,5:17 PM

## 2013-07-13 NOTE — Progress Notes (Signed)
As per patient, he was switched from Coumadin to Pradaxa for the DVT by his PCP. Called and discussed with Dr Arlean HoppingSchertz who agrees that Pradaxa is not indicated in Dialysis patients. The best treatment would be to put him back on Coumadin.  Discussed with patient, he agrees to restarting coumadin. The INR is sub therapeutic, will start with coumadin with heparin bridging.

## 2013-07-14 ENCOUNTER — Other Ambulatory Visit: Payer: Self-pay

## 2013-07-14 DIAGNOSIS — I82629 Acute embolism and thrombosis of deep veins of unspecified upper extremity: Secondary | ICD-10-CM

## 2013-07-14 LAB — RENAL FUNCTION PANEL
ALBUMIN: 2.9 g/dL — AB (ref 3.5–5.2)
BUN: 60 mg/dL — ABNORMAL HIGH (ref 6–23)
CHLORIDE: 83 meq/L — AB (ref 96–112)
CO2: 22 mEq/L (ref 19–32)
CREATININE: 8.09 mg/dL — AB (ref 0.50–1.35)
Calcium: 8.9 mg/dL (ref 8.4–10.5)
GFR, EST AFRICAN AMERICAN: 8 mL/min — AB (ref >90)
GFR, EST NON AFRICAN AMERICAN: 7 mL/min — AB (ref >90)
Glucose, Bld: 332 mg/dL — ABNORMAL HIGH (ref 70–99)
Phosphorus: 6.2 mg/dL — ABNORMAL HIGH (ref 2.3–4.6)
Potassium: 5.7 mEq/L — ABNORMAL HIGH (ref 3.7–5.3)
SODIUM: 126 meq/L — AB (ref 137–147)

## 2013-07-14 LAB — GLUCOSE, CAPILLARY
GLUCOSE-CAPILLARY: 91 mg/dL (ref 70–99)
GLUCOSE-CAPILLARY: 47 mg/dL — AB (ref 70–99)
Glucose-Capillary: 182 mg/dL — ABNORMAL HIGH (ref 70–99)
Glucose-Capillary: 216 mg/dL — ABNORMAL HIGH (ref 70–99)
Glucose-Capillary: 231 mg/dL — ABNORMAL HIGH (ref 70–99)
Glucose-Capillary: 259 mg/dL — ABNORMAL HIGH (ref 70–99)
Glucose-Capillary: 279 mg/dL — ABNORMAL HIGH (ref 70–99)
Glucose-Capillary: 44 mg/dL — CL (ref 70–99)
Glucose-Capillary: 53 mg/dL — ABNORMAL LOW (ref 70–99)

## 2013-07-14 LAB — CBC
HCT: 31.8 % — ABNORMAL LOW (ref 39.0–52.0)
HEMATOCRIT: 34.2 % — AB (ref 39.0–52.0)
HEMOGLOBIN: 11.6 g/dL — AB (ref 13.0–17.0)
Hemoglobin: 10.6 g/dL — ABNORMAL LOW (ref 13.0–17.0)
MCH: 30.3 pg (ref 26.0–34.0)
MCH: 29.9 pg (ref 26.0–34.0)
MCHC: 33.9 g/dL (ref 30.0–36.0)
MCHC: 33.3 g/dL (ref 30.0–36.0)
MCV: 89.3 fL (ref 78.0–100.0)
MCV: 89.8 fL (ref 78.0–100.0)
PLATELETS: 172 10*3/uL (ref 150–400)
Platelets: 184 10*3/uL (ref 150–400)
RBC: 3.83 MIL/uL — ABNORMAL LOW (ref 4.22–5.81)
RBC: 3.54 MIL/uL — AB (ref 4.22–5.81)
RDW: 15.3 % (ref 11.5–15.5)
RDW: 15.3 % (ref 11.5–15.5)
WBC: 5.8 10*3/uL (ref 4.0–10.5)
WBC: 4.9 10*3/uL (ref 4.0–10.5)

## 2013-07-14 LAB — HEPARIN LEVEL (UNFRACTIONATED)
HEPARIN UNFRACTIONATED: 0.22 [IU]/mL — AB (ref 0.30–0.70)
Heparin Unfractionated: <0.1 IU/mL — ABNORMAL LOW (ref 0.30–0.70)

## 2013-07-14 LAB — PROTIME-INR
INR: 1.39 (ref 0.00–1.49)
PROTHROMBIN TIME: 16.7 s — AB (ref 11.6–15.2)

## 2013-07-14 MED ORDER — METOPROLOL TARTRATE 50 MG PO TABS
75.0000 mg | ORAL_TABLET | Freq: Two times a day (BID) | ORAL | Status: DC
  Administered 2013-07-14 – 2013-07-15 (×2): 75 mg via ORAL
  Filled 2013-07-14 (×3): qty 1

## 2013-07-14 MED ORDER — AMLODIPINE BESYLATE 10 MG PO TABS
10.0000 mg | ORAL_TABLET | Freq: Every day | ORAL | Status: DC
  Administered 2013-07-14: 10 mg via ORAL
  Filled 2013-07-14 (×2): qty 1

## 2013-07-14 MED ORDER — DEXTROSE 50 % IV SOLN
INTRAVENOUS | Status: AC
  Administered 2013-07-14: 50 mL
  Filled 2013-07-14: qty 50

## 2013-07-14 MED ORDER — WARFARIN SODIUM 7.5 MG PO TABS
7.5000 mg | ORAL_TABLET | Freq: Once | ORAL | Status: AC
  Administered 2013-07-14: 7.5 mg via ORAL
  Filled 2013-07-14: qty 1

## 2013-07-14 MED ORDER — AMLODIPINE BESYLATE 10 MG PO TABS
10.0000 mg | ORAL_TABLET | Freq: Every day | ORAL | Status: DC
  Administered 2013-07-14: 10 mg via ORAL
  Filled 2013-07-14: qty 1

## 2013-07-14 MED ORDER — WARFARIN SODIUM 7.5 MG PO TABS
7.5000 mg | ORAL_TABLET | Freq: Once | ORAL | Status: DC
  Filled 2013-07-14: qty 1

## 2013-07-14 MED ORDER — CLONIDINE HCL 0.1 MG PO TABS
0.1000 mg | ORAL_TABLET | Freq: Two times a day (BID) | ORAL | Status: DC
  Administered 2013-07-14: 0.1 mg via ORAL
  Filled 2013-07-14 (×2): qty 1

## 2013-07-14 MED ORDER — ONDANSETRON HCL 4 MG/2ML IJ SOLN
INTRAMUSCULAR | Status: AC
  Administered 2013-07-14: 4 mg via INTRAVENOUS
  Filled 2013-07-14: qty 2

## 2013-07-14 MED ORDER — CLONIDINE HCL 0.1 MG PO TABS
0.1000 mg | ORAL_TABLET | Freq: Three times a day (TID) | ORAL | Status: DC
  Administered 2013-07-14 – 2013-07-15 (×4): 0.1 mg via ORAL
  Filled 2013-07-14 (×6): qty 1

## 2013-07-14 NOTE — Progress Notes (Signed)
ANTICOAGULATION CONSULT NOTE - Follow Up Consult  Pharmacy Consult for heparin and coumadin Indication: DVT  Allergies  Allergen Reactions  . Penicillins Other (See Comments)    Unknown reaction    Patient Measurements: Height: 6\' 2"  (188 cm) Weight: 138 lb 7.2 oz (62.8 kg) IBW/kg (Calculated) : 82.2 Heparin Dosing Weight: 60 kg  Vital Signs: Temp: 97.1 F (36.2 C) (03/30 1215) Temp src: Oral (03/30 1215) BP: 110/59 mmHg (03/30 1524) Pulse Rate: 94 (03/30 1524)  Labs:  Recent Labs  07/13/13 0545 07/14/13 0402 07/14/13 1226 07/14/13 1330  HGB 12.0* 11.6* 10.6*  --   HCT 35.4* 34.2* 31.8*  --   PLT 165 184 172  --   LABPROT 16.7* 16.7*  --   --   INR 1.39 1.39  --   --   HEPARINUNFRC  --  <0.10*  --  0.22*  CREATININE 6.76*  --  8.09*  --     Estimated Creatinine Clearance: 8.9 ml/min (by C-G formula based on Cr of 8.09).   Assessment: Patient is a 58 y.o M on heparin and coumadin for recent DVT (per ultrasound at [place] in February 2015).  Heparin level and INR remain subtherapeutic at 0.22 and 1.39, respectively.   Goal of Therapy:  INR 2-3 Heparin level 0.3-0.7 units/ml Monitor platelets by anticoagulation protocol: Yes   Plan:  1) increase heparin drip 1300 units/hr 2) check 6 hour level 3) coumadin 7.5mg  PO x1 today  Kyair Ditommaso P 07/14/2013,3:28 PM

## 2013-07-14 NOTE — Progress Notes (Signed)
Utilization Review Completed.Marcus [name] T3/30/2015  

## 2013-07-14 NOTE — Procedures (Signed)
Patient seen on Hemodialysis. QB 350, UF goal 3L Treatment adjusted as needed.  Zetta BillsJay Yomayra Tate MD [facility]. Office # [phone] Pager # [phone] 2:54 PM

## 2013-07-14 NOTE — Progress Notes (Signed)
Pt leaving floor to go to dialysis. Pt states he has chest pain rating a 10/10. Gave pain medication per md orders. Did a stat EKG and pt  Was in sinus rhythm. Nurse told dialysis nurse if there was any problem please call 2c floor nurse. Called and notified central telemetry.

## 2013-07-14 NOTE — Progress Notes (Signed)
Patient ID: Marcus JacksonCharles Lubrano, male   DOB: 1955-08-11, 58 y.o.   MRN: [id number]   [facility] Progress Note   Assessment/ Plan:   1. HHNK - glucose levels improved on insulin, patient's mental status better than what was described to me from yesterday. 2. Hypertensive Emergency/ Volume - blood pressures remained slightly elevated, we'll restart his oral antihypertensive therapy now that he can take orally. Anticipate will improve with dialysis as well although volume a very minor role at this time. 3. ESRD - MWF, HD today-nonurgent. Near end-stage access w femoral Diatek 4. Anemia - Hgb at goal-off ESA at this time. . 5. Metabolic bone disease - Continue op Phoslo + Renvela. Await phosphorus levels from labs today. 6. Nutrition - Renal carb mod diet, multivitamin 7. Disposition: Unfortunately, due to multiple factors including end-stage access-his quality of life has been deteriorating over the past few months. At present, he is unwilling to consider discontinuation of therapies/adoption of palliative care    Subjective:   Reports to be feeling better-had a comfortable night, denies any chest pain or shortness of breath    Objective:   BP 177/100  Pulse 91  Temp(Src) 98.3 F (36.8 C) (Oral)  Resp 14  Ht 6\' 2"  (1.88 m)  Wt 61.9 kg (136 lb 7.4 oz)  BMI 17.51 kg/m2  SpO2 100%  Physical Exam: Gen: comfortably resting in bed, awake and engages in conversation CVS: pulse regular in rate and rhythm, S1 and S2 normal Resp: coarse breath sounds bilaterally-no distinct rales or rhonchi  UVO:ZDGUAbd:soft, flat, nontender and bowel sounds normal  Ext: no lower with edema. Bilateral upper extremity edema (chronic and associated with central venous stenosis)   Labs: BMET  Recent Labs Lab 07/13/13 0545  NA 127*  K 4.2  CL 87*  CO2 23  GLUCOSE 116*  BUN 47*  CREATININE 6.76*  CALCIUM 9.1   CBC  Recent Labs Lab 07/13/13 0545 07/14/13 0402  WBC 5.6 5.8  HGB 12.0* 11.6*   HCT 35.4* 34.2*  MCV 89.4 89.3  PLT 165 184   Medications:    . calcium acetate  667 mg Oral TID WC  . ferric gluconate (FERRLECIT/NULECIT) IV  125 mg Intravenous Q M,W,F-HD  . insulin aspart  0-9 Units Subcutaneous 6 times per day  . insulin regular  0-10 Units Intravenous TID WC  . multivitamin  1 tablet Oral QHS  . sevelamer carbonate  800 mg Oral TID WC  . sodium chloride  3 mL Intravenous Q12H  . warfarin  7.5 mg Oral ONCE-1800  . Warfarin - Pharmacist Dosing Inpatient   Does not apply [id number]   Zetta BillsJay Seraphim Affinito, MD 07/14/2013, 7:57 AM

## 2013-07-14 NOTE — Progress Notes (Signed)
Inpatient Diabetes Program Recommendations  AACE/ADA: New Consensus Statement on Inpatient Glycemic Control (2013)  Target Ranges:  Prepandial:   less than 140 mg/dL      Peak postprandial:   less than 180 mg/dL (1-2 hours)      Critically ill patients:  140 - 180 mg/dL   Inpatient Diabetes Program Recommendations Insulin - Basal: May want to change basal insulin to levemir 8 units bid (rather than lantus) as pt takes at home. Correction (SSI): Please change correciton to tidwc now that pt is takaig po's. Hypoglycemia this am (levemir may be a safer drug for the renal patient). Thank you, Lenor CoffinAnn Bell Carbo, RN, CNS, Diabetes Coordinator [phone])

## 2013-07-14 NOTE — Progress Notes (Signed)
[facility] PROGRESS NOTE  Marcus [name] ZJI:[id number] DOB: [birth year] DOA: 07/13/2013 PCP: Galvin Proffer, MD  Assessment/Plan: 1. HHNC- Resolved, he is now off insulin drip. 2. Diabetes Mellitus- Blood glucose has been low, continue with sliding scale insulin. Encourage po intake. 3. Right arm DVT- Continue with Warfarin and heparin protocol, Pradaxa has been discontinued as it is not indicated in Dialysis patient. 4. ESRD- On Hemodialysis MWF 5. Hypothyroidism- continue synthroid. 6. Hypertensive urgency- Will increase the dose of Clonidine 0.1 mg po TID, continue with Amlodipine 10 mg po daily,Metoprolol 75 mg po BID.   Code Status: Full code Family Communication: *No family at bedside Disposition Plan: *Home when stable   Consultants:  Nephrology   Procedures:  None  Antibiotics:  None  HPI/Subjective: Patient seen and examined, BP has been elevated.  Objective: Filed Vitals:   07/14/13 0917  BP: 232/141  Pulse:   Temp:   Resp:     Intake/Output Summary (Last 24 hours) at 07/14/13 0946 Last data filed at 07/14/13 0909  Gross per 24 hour  Intake 545.17 ml  Output      0 ml  Net 545.17 ml   Filed Weights   07/13/13 0200 07/14/13 0423  Weight: 60.4 kg (133 lb 2.5 oz) 61.9 kg (136 lb 7.4 oz)    Exam:  Physical Exam: Head: Normocephalic, atraumatic.  Eyes: No signs of jaundice, EOMI Nose: Mucous membranes dry.  Throat: Oropharynx nonerythematous, no exudate appreciated.  Neck: supple,No deformities, masses, or tenderness noted. Lungs: Normal respiratory effort. B/L Clear to auscultation, no crackles or wheezes.  Heart: Regular RR. S1 and S2 normal  Abdomen: BS normoactive. Soft, Nondistended, non-tender.  Extremities: No pretibial edema, no erythema  Data Reviewed: Basic Metabolic Panel:  Recent Labs Lab 07/13/13 0545  NA 127*  K 4.2  CL 87*  CO2 23  GLUCOSE 116*  BUN 47*  CREATININE 6.76*  CALCIUM 9.1   Liver Function  Tests: No results found for this basename: AST, ALT, ALKPHOS, BILITOT, PROT, ALBUMIN,  in the last 168 hours No results found for this basename: LIPASE, AMYLASE,  in the last 168 hours No results found for this basename: AMMONIA,  in the last 168 hours CBC:  Recent Labs Lab 07/13/13 0545 07/14/13 0402  WBC 5.6 5.8  HGB 12.0* 11.6*  HCT 35.4* 34.2*  MCV 89.4 89.3  PLT 165 184   Cardiac Enzymes: No results found for this basename: CKTOTAL, CKMB, CKMBINDEX, TROPONINI,  in the last 168 hours BNP (last 3 results) No results found for this basename: PROBNP,  in the last 8760 hours CBG:  Recent Labs Lab 07/14/13 0425 07/14/13 0458 07/14/13 0758 07/14/13 0759 07/14/13 0801  GLUCAP 53* 91 41* 44* 47*    Recent Results (from the past 240 hour(s))  MRSA PCR SCREENING     Status: None   Collection Time    07/13/13  2:15 AM      Result Value Ref Range Status   MRSA by PCR NEGATIVE  NEGATIVE Final   Comment:            The GeneXpert MRSA Assay (FDA     approved for NASAL specimens     only), is one component of a     comprehensive MRSA colonization     surveillance program. It is not     intended to diagnose MRSA     infection nor to guide or     monitor treatment for     MRSA  infections.     Studies: Dg Hand 2 View Right  07/13/2013   CLINICAL DATA:  Bilateral hand joint deformity and swelling. No reported injury.  EXAM: RIGHT HAND - 2 VIEW  COMPARISON:  None.  FINDINGS: Flexion deformity of the little finger. Otherwise, normal appearing bones. Arterial calcifications.  IMPRESSION: Little finger flexion deformity.   Electronically Signed   By: Gordan PaymentSteve  Reid M.D.   On: 07/13/2013 15:40   Dg Hand 2 View Left  07/13/2013   CLINICAL DATA:  Left hand joint deformity and swelling. No reported injury.  EXAM: LEFT HAND - 2 VIEW  COMPARISON:  None.  FINDINGS: Flexion deformity of the little finger. Arterial calcifications. Otherwise, normal appearing bones and soft tissues.   IMPRESSION: Little finger flexion deformity.   Electronically Signed   By: Gordan PaymentSteve  Reid M.D.   On: 07/13/2013 15:41    Scheduled Meds: . amLODipine  10 mg Oral Daily  . amLODipine  10 mg Oral QHS  . calcium acetate  667 mg Oral TID WC  . cloNIDine  0.1 mg Oral TID  . ferric gluconate (FERRLECIT/NULECIT) IV  125 mg Intravenous Q M,W,F-HD  . insulin aspart  0-9 Units Subcutaneous 6 times per day  . insulin regular  0-10 Units Intravenous TID WC  . metoprolol tartrate  75 mg Oral BID  . multivitamin  1 tablet Oral QHS  . sevelamer carbonate  800 mg Oral TID WC  . sodium chloride  3 mL Intravenous Q12H  . warfarin  7.5 mg Oral ONCE-1800  . Warfarin - Pharmacist Dosing Inpatient   Does not apply q1800   Continuous Infusions: . sodium chloride Stopped (07/13/13 0400)  . dextrose 5 % and 0.45% NaCl 20 mL/hr at 07/14/13 0600  . heparin 1,200 Units/hr (07/14/13 0555)  . insulin (NOVOLIN-R) infusion Stopped (07/13/13 0400)    Principal Problem:   HHNC (hyperglycemic hyperosmolar nonketotic coma) Active Problems:   ESRD (end stage renal disease) on dialysis   Hyponatremia   Unspecified hypothyroidism   Hypertensive emergency, no CHF   Headache(784.0)   Coagulopathy    Time spent: 25 min    [facility],Dorea Duff S  [facility] Pager [phone]. If 7PM-7AM, please contact night-coverage at [email or web address], password [email or web address] 07/14/2013, 9:46 AM  LOS: 1 day

## 2013-07-14 NOTE — Progress Notes (Signed)
Pt just got back to floor from dialysis.

## 2013-07-14 NOTE — Progress Notes (Signed)
ANTICOAGULATION CONSULT NOTE   Pharmacy Consult for Heparin/coumadin Indication: DVT  Allergies  Allergen Reactions  . Penicillins Other (See Comments)    Unknown reaction    Patient Measurements: Height: 6\' 2"  (188 cm) Weight: 136 lb 7.4 oz (61.9 kg) IBW/kg (Calculated) : 82.2 Heparin Dosing Weight: 60kg  Vital Signs: Temp: 98.3 F (36.8 C) (03/30 0423) Temp src: Oral (03/30 0423) BP: 177/100 mmHg (03/30 0024) Pulse Rate: 91 (03/30 0423)  Labs:  Recent Labs  07/13/13 0545 07/14/13 0402  HGB 12.0* 11.6*  HCT 35.4* 34.2*  PLT 165 184  LABPROT 16.7* 16.7*  INR 1.39 1.39  HEPARINUNFRC  --  <0.10*  CREATININE 6.76*  --     Estimated Creatinine Clearance: 10.6 ml/min (by C-G formula based on Cr of 6.76).   Medical History: Past Medical History  Diagnosis Date  . Hypothyroidism   . Hyperlipidemia   . CVA (cerebrovascular accident)     hx CVA x 2  . GI bleed     2006, mallory-weiss tear  . Pulmonary edema     nov 2011, dry wt decreased  . Goiter     by CT Apr 2013  . Depression   . Cocaine abuse     past history  . Tobacco abuse     history of  . GERD (gastroesophageal reflux disease)   . Cataract   . Insomnia   . Chronic pain   . HTN (hypertension)     [place] Dr. Ardelle [place], [place]  . Neuromuscular disorder     diabetic neuropathy  . Occlusion of carotid stent     Per patients nursing aid  . Pneumonia     hx of  . Diabetes mellitus     Sees Dr. Reather LittlerAjay Kumar [phone] -- fasting blood 70-110s  . DKA (diabetic ketoacidosis)     severe, Dec 2011  . History of blood transfusion   . On home oxygen therapy     2l nasal cannula at night  . Hemodialysis patient     M, W, F  . CHF (congestive heart failure)   . DVT (deep venous thrombosis)   . ESRD (end stage renal disease) on dialysis     hd - [facility] - [place]    Assessment: [age] YOM with ESRD on HD who transferred from [place] with headache, hypertensive emergency and confusion, he has history of  DVT.  Initial heparin level <0.1 units/ml.  INR today 1.39  Goal of Therapy:  INR 2-3 Heparin level 0.3-0.7 units/ml Monitor platelets by anticoagulation protocol: Yes   Plan:  Heparin infusion 1200 units/hr Coumadin 7.5mg  po x 1 today F/u 8 hr heparin level F/u daily PT/INR, heparin level and CBC D/c heparin when INR > 2  Talbert CageLora Hortense Cantrall, PharmD Clinical Pharmacist  Pager: [phone] 07/14/2013,5:24 AM

## 2013-07-15 ENCOUNTER — Inpatient Hospital Stay (HOSPITAL_COMMUNITY): Payer: Medicare Other

## 2013-07-15 DIAGNOSIS — I82A19 Acute embolism and thrombosis of unspecified axillary vein: Secondary | ICD-10-CM

## 2013-07-15 DIAGNOSIS — I82409 Acute embolism and thrombosis of unspecified deep veins of unspecified lower extremity: Secondary | ICD-10-CM

## 2013-07-15 LAB — GLUCOSE, CAPILLARY
GLUCOSE-CAPILLARY: 233 mg/dL — AB (ref 70–99)
GLUCOSE-CAPILLARY: 205 mg/dL — AB (ref 70–99)
Glucose-Capillary: 41 mg/dL — CL (ref 70–99)
Glucose-Capillary: 179 mg/dL — ABNORMAL HIGH (ref 70–99)
Glucose-Capillary: 151 mg/dL — ABNORMAL HIGH (ref 70–99)
Glucose-Capillary: 174 mg/dL — ABNORMAL HIGH (ref 70–99)

## 2013-07-15 LAB — HEPARIN LEVEL (UNFRACTIONATED): Heparin Unfractionated: 0.73 IU/mL — ABNORMAL HIGH (ref 0.30–0.70)

## 2013-07-15 LAB — PARATHYROID HORMONE, INTACT (NO CA): PTH: 263.5 pg/mL — ABNORMAL HIGH (ref 14.0–72.0)

## 2013-07-15 MED ORDER — INSULIN GLARGINE 100 UNIT/ML ~~LOC~~ SOLN: 8.0000 [IU] | Freq: Two times a day (BID) | SUBCUTANEOUS | Status: DC

## 2013-07-15 MED ORDER — BISACODYL 10 MG RE SUPP
10.0000 mg | Freq: Once | RECTAL | Status: DC
  Filled 2013-07-15: qty 1

## 2013-07-15 MED ORDER — AMLODIPINE BESYLATE 10 MG PO TABS: 10.0000 mg | ORAL_TABLET | Freq: Every day | ORAL | Status: DC

## 2013-07-15 MED ORDER — WARFARIN SODIUM 7.5 MG PO TABS
7.5000 mg | ORAL_TABLET | Freq: Once | ORAL | Status: AC
  Administered 2013-07-15: 7.5 mg via ORAL
  Filled 2013-07-15: qty 1

## 2013-07-15 MED ORDER — ENOXAPARIN SODIUM 60 MG/0.6ML ~~LOC~~ SOLN
1.0000 mg/kg | SUBCUTANEOUS | Status: AC
Start: 2013-07-15 — End: 2013-07-20

## 2013-07-15 MED ORDER — CLONIDINE HCL 0.1 MG PO TABS: 0.1000 mg | ORAL_TABLET | Freq: Three times a day (TID) | ORAL | Status: DC

## 2013-07-15 MED ORDER — WARFARIN SODIUM 7.5 MG PO TABS: 7.5000 mg | ORAL_TABLET | Freq: Every day | ORAL | Status: DC

## 2013-07-15 MED ORDER — ENOXAPARIN SODIUM 60 MG/0.6ML ~~LOC~~ SOLN
1.0000 mg/kg | SUBCUTANEOUS | Status: DC
  Administered 2013-07-15: 60 mg via SUBCUTANEOUS
  Filled 2013-07-15: qty 0.6

## 2013-07-15 NOTE — Progress Notes (Addendum)
Consulted supervisor to ask about resources for pt to get home; pt from [place] and family states there is no one that can pick pt up today (RNCM and RN had conversations with pt's sister). Supervisor recommended arranging PTAR. I have finished PTAR form and will provide this to RN along with PTAR phone number. RN can call PTAR. I am signing off.  Addendum: Scheduled PTAR online and left PTAR phone number with RN to call if she needs to bump time back. Pt dressed and sitting up on side of bed.  Addendum: Explained to pt he will likely get a bill for PTAR trip. Asked if he would prefer we arrange a cab; pt states he does not have his wallet and he would not be able to pay for a cab. Prefers the non-emergency ambulance, stating "send me a bill, then." Placed PTAR form in packet, and RN will go through discharge paperwork with pt.  Maryclare LabradorJulie Gerlene Glassburn, MSW, [facility] BroussardCSWA Clinical Social Worker [phone]

## 2013-07-15 NOTE — Discharge Summary (Addendum)
Physician Discharge Summary  Marcus [name] ZOX:[id number] DOB: [birth year] DOA: 07/13/2013  PCP: Galvin Proffer, MD  Admit date: 07/13/2013 Discharge date: 07/15/2013  Time spent: *50 minutes  Recommendations for Outpatient Follow-up:  *Follow up PCP in 2 weeks  Discharge Diagnoses:  Principal Problem:   HHNC (hyperglycemic hyperosmolar nonketotic coma) Active Problems:   ESRD (end stage renal disease) on dialysis   Hyponatremia   Unspecified hypothyroidism   Hypertensive emergency, no CHF   Headache(784.0)   Coagulopathy   Discharge Condition: Stable  Diet recommendation: Diabetic diet  Filed Weights   07/14/13 1215 07/14/13 1646 07/15/13 0355  Weight: 62.8 kg (138 lb 7.2 oz) 60.7 kg (133 lb 13.1 oz) 60.7 kg (133 lb 13.1 oz)    History of present illness:  58 y.o. male with multiple Medical Problems including ESRD on HD (MWF), DM2, HTN, DVT on Pradaxa Rx who presented to the ED at [facility] with complaints of Headache X 3 days and today was noticed buy family to have confusion. He was evaluated and found to be extremely hypertensive with a Blood pressure of 230 systolic and was found to have a glucose level of 713 however was nonketotic. A CT scan of the Head was performed and was found to be negative for acute findings. He was administered IV Labetalol, and Insulin and placed on the Glucose Stabilizer and transferred to [facility] for further treatment.  Patient arrived on the Insulin drip with improvement in his glucose level and , also with high blood pressure but improvement from his initital readings at [place]. He reports that he has had severe headaches for years, and they are becoming more frequent, he has had 6 episodes this month. The headaches are throbbing and 10/10 and located behind the right eye. He is not sure if they are migraine headaches, and he was to have an MRI of his Brain done as an outpatient and was to see a Dr. Georgiana Shore but he has not followed up  yet. He states that he has not missed any of his dialysis treatments, but he reports that he has not taken his medications in the last 24 hours due to his severe headache. He denies having any nausea or vomiting or fevers or chills or meningeal symptoms.    Hospital Course:  1. HHNC-patient was admitted with hyperosmolar hyperglycemic non-.state, which resolved after getting IV fluids and insulin drip. Resolved, he is now off insulin drip. Patient's blood sugar has been well controlled. 2. Diabetes Mellitus- patient takes Lantus 8 units twice a day along with NovoLog 2 units before meals. He'll be given prescriptions for Lantus and will continue to take medications as prescribed. 3. Right arm DVT- patient was diagnosed with right arm DVT and initially started on warfarin in February, later his primary care provider changed warfarin to Pradaxa, which is not indicated in dialysis patient, Pradaxa has been discontinued as it is not indicated in Dialysis patient. Patient was started back on heparin and Coumadin. At this time his INR is therapeutic INR is 1.39, patient will go home on Lovenox 1 mg per KG dose once a day along with Coumadin 7.5 mg by mouth daily. to complete 5 days overlap of Coumadin with heparin. Home health RN will be arranged to check PT/INR at home, he'll have to follow up with his primary care provider to check PT/INR as outpatient. 4. ESRD- On Hemodialysis MWF. Patient will get his hemodialysis on Wednesday as outpatient 5. Hypothyroidism- continue synthroid. 6. Hypertensive urgency-  patient will continue his home medications we'll also give him prescriptions for Catapres 0.1 3 times a day. She will also continue taking amlodipine 10 mg by mouth daily along with metoprolol 75 mg by mouth twice a day. 7. Constipation- patient had nausea vomiting abdominal pain this morning,  X-ray abdomen showed constipation . We'll give 1 dose of Dulcolax  suppository.   Procedures:  None  Consultations:  Nephrology  Discharge Exam: Filed Vitals:   07/15/13 0904  BP:   Pulse: 78  Temp:   Resp:     General: Appears in no acute distress Cardiovascular: S1-S2 regular Respiratory: Clear bilaterally  Discharge Instructions  Discharge Orders   Future Orders Complete By Expires   Diet - low sodium heart healthy  As directed    Increase activity slowly  As directed        Medication List         amLODipine 10 MG tablet  Commonly known as:  NORVASC  Take 1 tablet (10 mg total) by mouth at bedtime.     calcium acetate 667 MG capsule  Commonly known as:  PHOSLO  Take 667 mg by mouth 3 (three) times daily with meals.     cloNIDine 0.1 MG tablet  Commonly known as:  CATAPRES  Take 1 tablet (0.1 mg total) by mouth 3 (three) times daily.     docusate sodium 100 MG capsule  Commonly known as:  COLACE  Take 100 mg by mouth 2 (two) times daily.     enoxaparin 60 MG/0.6ML injection  Commonly known as:  LOVENOX  Inject 0.6 mLs (60 mg total) into the skin daily.     FLUoxetine 10 MG tablet  Commonly known as:  PROZAC  Take 0.5 tablets (5 mg total) by mouth every morning.     insulin aspart 100 UNIT/ML injection  Commonly known as:  novoLOG  Inject 0-5 Units into the skin 3 (three) times daily before meals.     insulin glargine 100 UNIT/ML injection  Commonly known as:  LANTUS  Inject 0.08 mLs (8 Units total) into the skin 2 (two) times daily.     levothyroxine 112 MCG tablet  Commonly known as:  SYNTHROID, LEVOTHROID  Take 1.5 tablets (168 mcg total) by mouth daily before breakfast.     metoCLOPramide 5 MG tablet  Commonly known as:  REGLAN  Take 1 tablet (5 mg total) by mouth every 6 (six) hours as needed for nausea.     metoprolol 50 MG tablet  Commonly known as:  LOPRESSOR  Take 1.5 tablets (75 mg total) by mouth 2 (two) times daily. Hold morning dose on dialysis days     omeprazole 20 MG capsule  Commonly  known as:  PRILOSEC  Take 20 mg by mouth daily.     pravastatin 10 MG tablet  Commonly known as:  PRAVACHOL  Take 10 mg by mouth at bedtime.     PROAIR HFA 108 (90 BASE) MCG/ACT inhaler  Generic drug:  albuterol  Inhale 2 puffs into the lungs every 4 (four) hours as needed for wheezing or shortness of breath.     risperiDONE 0.5 MG tablet  Commonly known as:  RISPERDAL  Take 0.5 mg by mouth 2 (two) times daily as needed.     sevelamer carbonate 800 MG tablet  Commonly known as:  RENVELA  Take 800 mg by mouth 3 (three) times daily with meals.     temazepam 15 MG capsule  Commonly known as:  RESTORIL  Take 15 mg by mouth See admin instructions. Take on Monday, Wednesday, Friday, and Saturday night     warfarin 7.5 MG tablet  Commonly known as:  COUMADIN  Take 1 tablet (7.5 mg total) by mouth daily.       Allergies  Allergen Reactions  . Penicillins Other (See Comments)    Unknown reaction      The results of significant diagnostics from this hospitalization (including imaging, microbiology, ancillary and laboratory) are listed below for reference.    Significant Diagnostic Studies: Dg Hand 2 View Right  07/13/2013   CLINICAL DATA:  Bilateral hand joint deformity and swelling. No reported injury.  EXAM: RIGHT HAND - 2 VIEW  COMPARISON:  None.  FINDINGS: Flexion deformity of the little finger. Otherwise, normal appearing bones. Arterial calcifications.  IMPRESSION: Little finger flexion deformity.   Electronically Signed   By: Gordan PaymentSteve  Reid M.D.   On: 07/13/2013 15:40   Dg Hand 2 View Left  07/13/2013   CLINICAL DATA:  Left hand joint deformity and swelling. No reported injury.  EXAM: LEFT HAND - 2 VIEW  COMPARISON:  None.  FINDINGS: Flexion deformity of the little finger. Arterial calcifications. Otherwise, normal appearing bones and soft tissues.  IMPRESSION: Little finger flexion deformity.   Electronically Signed   By: Gordan PaymentSteve  Reid M.D.   On: 07/13/2013 15:41   Dg Abd 2  Views  07/15/2013   CLINICAL DATA:  Vomiting and weakness and congestion.  EXAM: ABDOMEN - 2 VIEW  COMPARISON:  None.  FINDINGS: The bowel gas pattern is relatively nonspecific. There is a moderate stool burden within the colon which could reflect clinical constipation. There is no evidence of a small or large bowel obstruction. No free extraluminal gas is demonstrated. The bony structures appear normal. No abnormal soft tissue calcifications are evident. A dual lumen dialysis type catheter is been placed via the right femoral approach. The tip of the catheter lies in the region of the junction of the IVC with the right atrium. The lung bases exhibit minimal prominence of the interstitial markings and possibly trace amounts of pleural fluid bilaterally.  IMPRESSION: There is increased stool burden throughout the colon which may reflect clinical constipation. There is no evidence of a small or large bowel obstruction or ileus.   Electronically Signed   By: David  SwazilandJordan   On: 07/15/2013 08:32    Microbiology: Recent Results (from the past 240 hour(s))  MRSA PCR SCREENING     Status: None   Collection Time    07/13/13  2:15 AM      Result Value Ref Range Status   MRSA by PCR NEGATIVE  NEGATIVE Final   Comment:            The GeneXpert MRSA Assay (FDA     approved for NASAL specimens     only), is one component of a     comprehensive MRSA colonization     surveillance program. It is not     intended to diagnose MRSA     infection nor to guide or     monitor treatment for     MRSA infections.     Labs: Basic Metabolic Panel:  Recent Labs Lab 07/13/13 0545 07/14/13 1226  NA 127* 126*  K 4.2 5.7*  CL 87* 83*  CO2 23 22  GLUCOSE 116* 332*  BUN 47* 60*  CREATININE 6.76* 8.09*  CALCIUM 9.1 8.9  PHOS  --  6.2*   Liver  Function Tests:  Recent Labs Lab 07/14/13 1226  ALBUMIN 2.9*   No results found for this basename: LIPASE, AMYLASE,  in the last 168 hours No results found for  this basename: AMMONIA,  in the last 168 hours CBC:  Recent Labs Lab 07/13/13 0545 07/14/13 0402 07/14/13 1226  WBC 5.6 5.8 4.9  HGB 12.0* 11.6* 10.6*  HCT 35.4* 34.2* 31.8*  MCV 89.4 89.3 89.8  PLT 165 184 172   Cardiac Enzymes: No results found for this basename: CKTOTAL, CKMB, CKMBINDEX, TROPONINI,  in the last 168 hours BNP: BNP (last 3 results) No results found for this basename: PROBNP,  in the last 8760 hours CBG:  Recent Labs Lab 07/14/13 1701 07/14/13 1942 07/14/13 2326 07/15/13 0357 07/15/13 0840  GLUCAP 216* 231* 179* 151* 233*       Signed:  Feiga Nadel S  [facility] 07/15/2013, 10:58 AM

## 2013-07-15 NOTE — Progress Notes (Signed)
Dr. Sharl MaLama inquired about pt vomiting. Nurse did not receive any information on report from night nurse that pt was vomiting. Entered pt's room to observe if pt vomiting. Pt had a container in which he said he had been sick in. Nurse observed contents in container. No vomit seen. Small amount of clear thin mucous observed. Orders placed per md for pt to go have an xray of abdomen. Pt left floor now with transporter. Called and notified central telemetry.

## 2013-07-15 NOTE — Progress Notes (Addendum)
ANTICOAGULATION CONSULT NOTE - Follow Up Consult  Pharmacy Consult for heparin  Indication: DVT  Allergies  Allergen Reactions  . Penicillins Other (See Comments)    Unknown reaction    Patient Measurements: Height: 6\' 2"  (188 cm) Weight: 133 lb 13.1 oz (60.7 kg) IBW/kg (Calculated) : 82.2 Heparin Dosing Weight: 60 kg  Vital Signs: Temp: 98.2 F (36.8 C) (03/30 2327) Temp src: Oral (03/30 2327) BP: 155/84 mmHg (03/30 2327) Pulse Rate: 71 (03/30 2327)  Labs:  Recent Labs  07/13/13 0545 07/14/13 0402 07/14/13 1226 07/14/13 1330 07/15/13 0019  HGB 12.0* 11.6* 10.6*  --   --   HCT 35.4* 34.2* 31.8*  --   --   PLT 165 184 172  --   --   LABPROT 16.7* 16.7*  --   --   --   INR 1.39 1.39  --   --   --   HEPARINUNFRC  --  <0.10*  --  0.22* 0.73*  CREATININE 6.76*  --  8.09*  --   --     Estimated Creatinine Clearance: 8.6 ml/min (by C-G formula based on Cr of 8.09).   Assessment: Patient is a 58 y.o M on heparin and coumadin for recent DVT (per ultrasound at [place] in February 2015).  Heparin level is 0.73 which is slightly above goal   Goal of Therapy:  INR 2-3 Heparin level 0.3-0.7 units/ml Monitor platelets by anticoagulation protocol: Yes   Plan:  1) decrease heparin drip to 1200 units/hr 2) check 6 hour level  Birdia Jaycox Poteet 07/15/2013,1:08 AM

## 2013-07-15 NOTE — Progress Notes (Signed)
Inpatient Diabetes Program Recommendations  AACE/ADA: New Consensus Statement on Inpatient Glycemic Control (2013)  Target Ranges:  Prepandial:   less than 140 mg/dL      Peak postprandial:   less than 180 mg/dL (1-2 hours)      Critically ill patients:  140 - 180 mg/dL   Results for Marcus [name], [name] (MRN [id number]) as of 07/15/2013 10:36  Ref. Range 07/14/2013 07:58 07/14/2013 07:59 07/14/2013 08:01 07/14/2013 09:53 07/14/2013 11:56 07/14/2013 17:01 07/14/2013 19:42 07/14/2013 23:26 07/15/2013 03:57 07/15/2013 08:40  Glucose-Capillary Latest Range: 70-99 mg/dL 41 (LL) 44 (LL) 47 (L) 182 (H) 279 (H) 216 (H) 231 (H) 179 (H) 151 (H) 233 (H)   Diabetes history: DM2 Outpatient Diabetes medications: Lantus 8 units daily, Novolog 2 units TID with meals Current orders for Inpatient glycemic control: Novolog 0-9 units Q4H  Inpatient Diabetes Program Recommendations IV fluids: Please re-evaluate need for Dextrose in IVF. Currently ordered D5 0.45% @ 10-20 ml/hr.   Thanks, Orlando PennerMarie Camora Tremain, RN, MSN, CCRN Diabetes Coordinator Inpatient Diabetes Program [phone] (Team Pager) [phone] ([facility] office) [phone] [facility] office)

## 2013-07-15 NOTE — Discharge Instructions (Signed)
Information on my medicine - Coumadin®   (Warfarin) ° °This medication education was reviewed with me or my healthcare representative as part of my discharge preparation.  The pharmacist that spoke with me during my hospital stay was:  Zyaire Dumas P, RPH ° °Why was Coumadin prescribed for you? °Coumadin was prescribed for you because you have a blood clot or a medical condition that can cause an increased risk of forming blood clots. Blood clots can cause serious health problems by blocking the flow of blood to the heart, lung, or brain. Coumadin can prevent harmful blood clots from forming. °As a reminder your indication for Coumadin is:   Deep Vein Thrombosis Treatment ° °What test will check on my response to Coumadin? °While on Coumadin (warfarin) you will need to have an INR test regularly to ensure that your dose is keeping you in the desired range. The INR (international normalized ratio) number is calculated from the result of the laboratory test called prothrombin time (PT). ° °If an INR APPOINTMENT HAS NOT ALREADY BEEN MADE FOR YOU please schedule an appointment to have this lab work done by your health care provider within 7 days. °Your INR goal is usually a number between:  2 to 3 or your provider may give you a more narrow range like 2-2.5.  Ask your health care provider during an office visit what your goal INR is. ° °What  do you need to  know  About  COUMADIN? °Take Coumadin (warfarin) exactly as prescribed by your healthcare provider about the same time each day.  DO NOT stop taking without talking to the doctor who prescribed the medication.  Stopping without other blood clot prevention medication to take the place of Coumadin may increase your risk of developing a new clot or stroke.  Get refills before you run out. ° °What do you do if you miss a dose? °If you miss a dose, take it as soon as you remember on the same day then continue your regularly scheduled regimen the next day.  Do not take two  doses of Coumadin at the same time. ° °Important Safety Information °A possible side effect of Coumadin (Warfarin) is an increased risk of bleeding. You should call your healthcare provider right away if you experience any of the following: °  Bleeding from an injury or your nose that does not stop. °  Unusual colored urine (red or dark brown) or unusual colored stools (red or black). °  Unusual bruising for unknown reasons. °  A serious fall or if you hit your head (even if there is no bleeding). ° °Some foods or medicines interact with Coumadin® (warfarin) and might alter your response to warfarin. To help avoid this: °  Eat a balanced diet, maintaining a consistent amount of Vitamin K. °  Notify your provider about major diet changes you plan to make. °  Avoid alcohol or limit your intake to 1 drink for women and 2 drinks for men per day. °(1 drink is 5 oz. wine, 12 oz. beer, or 1.5 oz. liquor.) ° °Make sure that ANY health care provider who prescribes medication for you knows that you are taking Coumadin (warfarin).  Also make sure the healthcare provider who is monitoring your Coumadin knows when you have started a new medication including herbals and non-prescription products. ° °Coumadin® (Warfarin)  Major Drug Interactions  °Increased Warfarin Effect Decreased Warfarin Effect  °Alcohol (large quantities) °Antibiotics (esp. Septra/Bactrim, Flagyl, Cipro) °Amiodarone (Cordarone) °Aspirin (ASA) °Cimetidine (  Tagamet) °Megestrol (Megace) °NSAIDs (ibuprofen, naproxen, etc.) °Piroxicam (Feldene) °Propafenone (Rythmol SR) °Propranolol (Inderal) °Isoniazid (INH) °Posaconazole (Noxafil) Barbiturates (Phenobarbital) °Carbamazepine (Tegretol) °Chlordiazepoxide (Librium) °Cholestyramine (Questran) °Griseofulvin °Oral Contraceptives °Rifampin °Sucralfate (Carafate) °Vitamin K  ° °Coumadin® (Warfarin) Major Herbal Interactions  °Increased Warfarin Effect Decreased Warfarin Effect  °Garlic °Ginseng °Ginkgo biloba Coenzyme  Q10 °Green tea °St. John’s wort   ° °Coumadin® (Warfarin) FOOD Interactions  °Eat a consistent number of servings per week of foods HIGH in Vitamin K °(1 serving = ½ cup)  °Collards (cooked, or boiled & drained) °Kale (cooked, or boiled & drained) °Mustard greens (cooked, or boiled & drained) °Parsley *serving size only = ¼ cup °Spinach (cooked, or boiled & drained) °Swiss chard (cooked, or boiled & drained) °Turnip greens (cooked, or boiled & drained)  °Eat a consistent number of servings per week of foods MEDIUM-HIGH in Vitamin K °(1 serving = 1 cup)  °Asparagus (cooked, or boiled & drained) °Broccoli (cooked, boiled & drained, or raw & chopped) °Brussel sprouts (cooked, or boiled & drained) *serving size only = ½ cup °Lettuce, raw (green leaf, endive, romaine) °Spinach, raw °Turnip greens, raw & chopped  ° °These websites have more information on Coumadin (warfarin):  www.coumadin.com; °www.ahrq.gov/consumer/coumadin.htm; ° ° °

## 2013-07-15 NOTE — Progress Notes (Signed)
Pt is back to floor.

## 2013-07-15 NOTE — Progress Notes (Signed)
Patient ID: Lorrin JacksonCharles Minehart, male   DOB: [birth year], 58 y.o.   MRN: [id number]   [facility] Progress Note   Assessment/ Plan:   1. HHNK - glucose levels improved on insulin, mentation back to baseline  2. Hypertensive Emergency/ Volume - blood pressures overall noted to be labile but with improving trend after restarting his oral antihypertensive therapy. 3. ESRD - MWF,will order HD again tomorrow if still here. Near end-stage access w femoral Diatek 4. Anemia - Hgb at goal-off ESA at this time. . 5. Metabolic bone disease - Continue op Phoslo + Renvela. Elevated phosphorous levels from labs yesterday. 6. Nutrition - Renal carb mod diet, multivitamin 7. Disposition: Unfortunately, due to multiple factors including end-stage access-his quality of life has been deteriorating over the past few months. At present, he is unwilling to consider discontinuation of therapies/adoption of palliative care   Subjective:   Reports nausea and abdominal pain this morning. Denies diarrhea and states that he has not had a bowel movement in 2-3 days.    Objective:   BP 155/84  Pulse 71  Temp(Src) 98.3 F (36.8 C) (Oral)  Resp 9  Ht 6\' 2"  (1.88 m)  Wt 60.7 kg (133 lb 13.1 oz)  BMI 17.17 kg/m2  SpO2 98%  Physical Exam: NWG:NFAOZHYGen:Appears uncomfortable in bed- retching CVS: Pulse regular in rate and rhythm, S1 and S2 with an ejection systolic murmur Resp: Coarse breath sounds bilaterally-no rales/rhonchi Abd: firm from guarding, tender LLQ, BS scant Ext: No lower with edema. Bilateral 1+ upper extremity edema  Labs: BMET  Recent Labs Lab 07/13/13 0545 07/14/13 1226  NA 127* 126*  K 4.2 5.7*  CL 87* 83*  CO2 23 22  GLUCOSE 116* 332*  BUN 47* 60*  CREATININE 6.76* 8.09*  CALCIUM 9.1 8.9  PHOS  --  6.2*   CBC  Recent Labs Lab 07/13/13 0545 07/14/13 0402 07/14/13 1226  WBC 5.6 5.8 4.9  HGB 12.0* 11.6* 10.6*  HCT 35.4* 34.2* 31.8*  MCV 89.4 89.3 89.8  PLT 165 184 172    Medications:    . amLODipine  10 mg Oral QHS  . calcium acetate  667 mg Oral TID WC  . cloNIDine  0.1 mg Oral TID  . insulin aspart  0-9 Units Subcutaneous 6 times per day  . metoprolol tartrate  75 mg Oral BID  . multivitamin  1 tablet Oral QHS  . sodium chloride  3 mL Intravenous Q12H  . Warfarin - Pharmacist Dosing Inpatient   Does not apply q1800   Zetta BillsJay Doniesha Landau, MD 07/15/2013, 8:00 AM

## 2013-07-16 NOTE — Care Management Note (Signed)
    Page 1 of 1   07/16/2013     7:53:44 AM   CARE MANAGEMENT NOTE 07/16/2013  Patient:  Marcus JacksonSTALEY,Marcus   Account Number:  [id number]  Date Initiated:  07/15/2013  Documentation initiated by:  Sahan Pen  Subjective/Objective Assessment:   dx hyperglycemia, HTN; lives with sister, has PCS 8a-2p; [facility]    PCP Dr Angelina PihImran [name]     DC Planning Services  CM consult      [facility]  [facility]/PTA Provider   [name]  HH[facility] RN  HH-6 SOCIAL WORKER      HH agency  [facility]   Status of service:  Completed, signed off  Discharge Disposition:  HOME W HOME HEALTH SERVICES  Per UR Regulation:  Reviewed for med. necessity/level of care/duration of stay  Comments:  07/15/13 1100 Teryl Mcconaghy RN MSN BSN CCM Pt to d/c on lovenox as INR sub-therapeutic.  [facility], they will plan home visit tomorrow, will either teach caregiver or home health nurse will administer daily injections.  [facility] Pt discharged but sister refuses to come to hospital to pick pt up, states PCS aide can pick him up tomorrow. CSW arranging ambulance transport.

## 2013-08-15 ENCOUNTER — Ambulatory Visit (HOSPITAL_COMMUNITY): Admission: RE | Admit: 2013-08-15 | Payer: Medicare Other | Source: Ambulatory Visit | Admitting: Surgery

## 2013-08-15 SURGERY — REMOVAL, DIALYSIS CATHETER
Anesthesia: Monitor Anesthesia Care | Laterality: Right

## 2013-09-10 ENCOUNTER — Telehealth: Payer: Self-pay

## 2013-09-10 NOTE — Telephone Encounter (Signed)
Message copied by Phillips Odor on Wed Sep 10, 2013 12:30 PM ------      Message from: Nada Libman      Created: Thu Sep 04, 2013  9:39 PM      Regarding: RE: office visit vs scheduling procedure?       Just get him scheduled for someone to do.  Sounds like he just needs to be out on for whoever has the first availability      ----- Message -----         From: Erenest Blank, RN         Sent: 09/04/2013   4:06 PM           To: Nada Libman, MD      Subject: office visit vs scheduling procedure?                    Please review pt's record and let me know how to proceed.  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  (Last note by [facility] was a Consult per [facility] on 05/19/13)  The pt. was scheduled for "Removal of Right Fem. Cath. and insert of Left neck vs left thigh diatek"  on 2/26; pt. cancelled due to weather.  [facility] called today, and said the pt. has fallen through the cracks.  He is on Coumadin.  She made the comment that "he is really sick".  Should I bring him back to office to get scheduled for surgery, or go ahead and schedule the procedures, as were previously scheduled?       ------

## 2013-09-10 NOTE — Telephone Encounter (Signed)
Phone call to pt's caregiver.  Advised that the nurse from the [facility] called and would like to reschedule pt's procedure for removal of right femoral dialysis catheter, and insertion of left neck versus left thigh dialysis catheter.  (this was scheduled 06/12/13 and cancelled due to inclement weather)  Pt's. Caregiver, Rosey Bath stated that pt. is scheduled for a Colonoscopy 09/16/13, and per instructions of pt's PCP, Dr. Ardelle Park, will stop Coumadin tomorrow and start Lovenox.  Rosey Bath questioned if pt. was up to having the surgery so soon after the colonscopy.  Will contact Dr. Stefanie Libel office re: this.

## 2013-09-11 NOTE — Telephone Encounter (Signed)
Call placed to Dr. Stefanie Libel office @ approx. 2:30 PM; spoke with Delaney Meigs.  Requested to ask Dr. Ardelle Park about scheduling pt. for "removal of right femoral dialysis catheter, and insertion of left neck vs left thigh dialysis catheter" on 6/4, following his Colonoscopy of 6/2.  Also, noted that pt. is holding Coumadin for the Colonoscopy, and being bridged with Lovenox, per Dr. Ardelle Park, and asked if it would be advised to try to schedule the surgery during the time he is off coumadin. Delaney Meigs will speak with Dr. Ardelle Park, and return call.

## 2013-09-12 ENCOUNTER — Other Ambulatory Visit: Payer: Self-pay

## 2013-09-12 NOTE — Telephone Encounter (Signed)
Rec'd phone call from [facility]/ Dr. Ardelle Park.  Stated that Dr. Ardelle Park is in agreement to schedule procedure for Dialysis Catheter removal/ insertion new dialysis catheter on 6/4.  Stated Dr. Ardelle Park will manage the Lovenox while pt. is off the Coumadin.  Notified pt's caregiver, Daphane Shepherd.  Agrees with plan.  Pre-op instructions given for 09/18/13.  Verb. Understanding.

## 2013-09-16 HISTORY — PX: COLONOSCOPY: SHX174

## 2013-09-17 ENCOUNTER — Encounter (HOSPITAL_COMMUNITY): Payer: Self-pay | Admitting: *Deleted

## 2013-09-17 MED ORDER — CIPROFLOXACIN IN D5W 400 MG/200ML IV SOLN
400.0000 mg | INTRAVENOUS | Status: AC
  Administered 2013-09-18: 400 mg via INTRAVENOUS
  Filled 2013-09-17: qty 200

## 2013-09-17 MED ORDER — SODIUM CHLORIDE 0.9 % IV SOLN
INTRAVENOUS | Status: DC
  Administered 2013-09-18: 07:00:00 via INTRAVENOUS

## 2013-09-17 MED ORDER — CHLORHEXIDINE GLUCONATE CLOTH 2 % EX PADS: 6.0000 | MEDICATED_PAD | Freq: Once | CUTANEOUS | Status: DC

## 2013-09-17 NOTE — Progress Notes (Signed)
[facility] Office had made a note on OR schedule that the best contact for pt was his aide, Daphane Shepherd. I called and spoke with her for his pre-op call.

## 2013-09-18 ENCOUNTER — Encounter (HOSPITAL_COMMUNITY)
Admission: RE | Disposition: A | Discharge status: Home / Self Care | Payer: Self-pay | Source: Ambulatory Visit | Attending: Vascular Surgery

## 2013-09-18 ENCOUNTER — Telehealth: Payer: Self-pay | Admitting: Vascular Surgery

## 2013-09-18 ENCOUNTER — Encounter: Payer: Self-pay | Admitting: Vascular Surgery

## 2013-09-18 ENCOUNTER — Encounter (HOSPITAL_COMMUNITY): Payer: Medicare Other | Admitting: Anesthesiology

## 2013-09-18 ENCOUNTER — Ambulatory Visit (HOSPITAL_COMMUNITY): Payer: Medicare Other | Admitting: Anesthesiology

## 2013-09-18 ENCOUNTER — Encounter (HOSPITAL_COMMUNITY): Payer: Self-pay | Admitting: *Deleted

## 2013-09-18 ENCOUNTER — Ambulatory Visit (HOSPITAL_COMMUNITY): Payer: Medicare Other

## 2013-09-18 ENCOUNTER — Ambulatory Visit (HOSPITAL_COMMUNITY)
Admission: RE | Admit: 2013-09-18 | Discharge: 2013-09-18 | Disposition: A | Discharge status: Home / Self Care | Payer: Medicare Other | Source: Ambulatory Visit | Attending: Vascular Surgery | Admitting: Vascular Surgery

## 2013-09-18 DIAGNOSIS — N186 End stage renal disease: Secondary | ICD-10-CM

## 2013-09-18 DIAGNOSIS — F3289 Other specified depressive episodes: Secondary | ICD-10-CM | POA: Insufficient documentation

## 2013-09-18 DIAGNOSIS — F329 Major depressive disorder, single episode, unspecified: Secondary | ICD-10-CM | POA: Insufficient documentation

## 2013-09-18 DIAGNOSIS — E119 Type 2 diabetes mellitus without complications: Secondary | ICD-10-CM | POA: Insufficient documentation

## 2013-09-18 DIAGNOSIS — Z452 Encounter for adjustment and management of vascular access device: Secondary | ICD-10-CM | POA: Insufficient documentation

## 2013-09-18 DIAGNOSIS — T82898A Other specified complication of vascular prosthetic devices, implants and grafts, initial encounter: Secondary | ICD-10-CM

## 2013-09-18 DIAGNOSIS — G4700 Insomnia, unspecified: Secondary | ICD-10-CM | POA: Insufficient documentation

## 2013-09-18 DIAGNOSIS — E785 Hyperlipidemia, unspecified: Secondary | ICD-10-CM | POA: Insufficient documentation

## 2013-09-18 DIAGNOSIS — Z86718 Personal history of other venous thrombosis and embolism: Secondary | ICD-10-CM | POA: Insufficient documentation

## 2013-09-18 DIAGNOSIS — Z992 Dependence on renal dialysis: Secondary | ICD-10-CM | POA: Insufficient documentation

## 2013-09-18 DIAGNOSIS — Z9981 Dependence on supplemental oxygen: Secondary | ICD-10-CM | POA: Insufficient documentation

## 2013-09-18 DIAGNOSIS — I509 Heart failure, unspecified: Secondary | ICD-10-CM | POA: Insufficient documentation

## 2013-09-18 DIAGNOSIS — Z8673 Personal history of transient ischemic attack (TIA), and cerebral infarction without residual deficits: Secondary | ICD-10-CM | POA: Insufficient documentation

## 2013-09-18 DIAGNOSIS — G8929 Other chronic pain: Secondary | ICD-10-CM | POA: Insufficient documentation

## 2013-09-18 DIAGNOSIS — K219 Gastro-esophageal reflux disease without esophagitis: Secondary | ICD-10-CM | POA: Insufficient documentation

## 2013-09-18 DIAGNOSIS — E039 Hypothyroidism, unspecified: Secondary | ICD-10-CM | POA: Insufficient documentation

## 2013-09-18 DIAGNOSIS — I120 Hypertensive chronic kidney disease with stage 5 chronic kidney disease or end stage renal disease: Secondary | ICD-10-CM | POA: Insufficient documentation

## 2013-09-18 HISTORY — PX: REMOVAL OF A DIALYSIS CATHETER: SHX6053

## 2013-09-18 HISTORY — PX: INSERTION OF DIALYSIS CATHETER: SHX1324

## 2013-09-18 LAB — PROTIME-INR
INR: 1.05 (ref 0.00–1.49)
PROTHROMBIN TIME: 13.5 s (ref 11.6–15.2)

## 2013-09-18 LAB — GLUCOSE, CAPILLARY
GLUCOSE-CAPILLARY: 121 mg/dL — AB (ref 70–99)
Glucose-Capillary: 30 mg/dL — CL (ref 70–99)
Glucose-Capillary: 128 mg/dL — ABNORMAL HIGH (ref 70–99)

## 2013-09-18 LAB — POCT I-STAT 4, (NA,K, GLUC, HGB,HCT)
Glucose, Bld: 75 mg/dL (ref 70–99)
HEMATOCRIT: 35 % — AB (ref 39.0–52.0)
Hemoglobin: 11.9 g/dL — ABNORMAL LOW (ref 13.0–17.0)
Potassium: 4 mEq/L (ref 3.7–5.3)
SODIUM: 136 meq/L — AB (ref 137–147)

## 2013-09-18 SURGERY — REMOVAL, DIALYSIS CATHETER
Anesthesia: General | Site: Groin | Laterality: Right

## 2013-09-18 MED ORDER — LIDOCAINE HCL (CARDIAC) 20 MG/ML IV SOLN
INTRAVENOUS | Status: AC
  Filled 2013-09-18: qty 5

## 2013-09-18 MED ORDER — SODIUM CHLORIDE 0.9 % IJ SOLN
INTRAMUSCULAR | Status: AC
  Filled 2013-09-18: qty 10

## 2013-09-18 MED ORDER — HEPARIN SODIUM (PORCINE) 1000 UNIT/ML IJ SOLN
INTRAMUSCULAR | Status: AC
  Filled 2013-09-18: qty 1

## 2013-09-18 MED ORDER — OXYCODONE-ACETAMINOPHEN 5-325 MG PO TABS
1.0000 | ORAL_TABLET | ORAL | Status: DC | PRN
  Administered 2013-09-18: 1 via ORAL

## 2013-09-18 MED ORDER — DEXTROSE 50 % IV SOLN
1.0000 | Freq: Once | INTRAVENOUS | Status: AC
  Administered 2013-09-18: 50 mL via INTRAVENOUS

## 2013-09-18 MED ORDER — FENTANYL CITRATE 0.05 MG/ML IJ SOLN
INTRAMUSCULAR | Status: AC
  Filled 2013-09-18: qty 5

## 2013-09-18 MED ORDER — MIDAZOLAM HCL 2 MG/2ML IJ SOLN
INTRAMUSCULAR | Status: AC
  Filled 2013-09-18: qty 2

## 2013-09-18 MED ORDER — HYDROMORPHONE HCL PF 1 MG/ML IJ SOLN
INTRAMUSCULAR | Status: AC
  Filled 2013-09-18: qty 1

## 2013-09-18 MED ORDER — HYDROMORPHONE HCL PF 1 MG/ML IJ SOLN
0.2500 mg | INTRAMUSCULAR | Status: DC | PRN
  Administered 2013-09-18: 0.5 mg via INTRAVENOUS

## 2013-09-18 MED ORDER — ROCURONIUM BROMIDE 50 MG/5ML IV SOLN
INTRAVENOUS | Status: AC
  Filled 2013-09-18: qty 1

## 2013-09-18 MED ORDER — PROPOFOL 10 MG/ML IV BOLUS
INTRAVENOUS | Status: AC
  Filled 2013-09-18: qty 20

## 2013-09-18 MED ORDER — ONDANSETRON HCL 4 MG/2ML IJ SOLN: 4.0000 mg | Freq: Once | INTRAMUSCULAR | Status: DC | PRN

## 2013-09-18 MED ORDER — GLUCOSE 40 % PO GEL
ORAL | Status: AC
  Filled 2013-09-18: qty 1

## 2013-09-18 MED ORDER — LIDOCAINE HCL (PF) 1 % IJ SOLN
INTRAMUSCULAR | Status: AC
  Filled 2013-09-18: qty 30

## 2013-09-18 MED ORDER — HEPARIN SODIUM (PORCINE) 1000 UNIT/ML IJ SOLN
INTRAMUSCULAR | Status: DC | PRN
  Administered 2013-09-18: 4.6 mL

## 2013-09-18 MED ORDER — EPHEDRINE SULFATE 50 MG/ML IJ SOLN
INTRAMUSCULAR | Status: AC
  Filled 2013-09-18: qty 1

## 2013-09-18 MED ORDER — OXYCODONE-ACETAMINOPHEN 5-325 MG PO TABS: 1.0000 | ORAL_TABLET | Freq: Four times a day (QID) | ORAL | Status: DC | PRN

## 2013-09-18 MED ORDER — ONDANSETRON HCL 4 MG/2ML IJ SOLN
INTRAMUSCULAR | Status: DC | PRN
  Administered 2013-09-18: 4 mg via INTRAVENOUS

## 2013-09-18 MED ORDER — SODIUM CHLORIDE 0.9 % IV SOLN
INTRAVENOUS | Status: DC | PRN
  Administered 2013-09-18: 07:00:00 via INTRAVENOUS

## 2013-09-18 MED ORDER — LIDOCAINE HCL (CARDIAC) 20 MG/ML IV SOLN
INTRAVENOUS | Status: DC | PRN
  Administered 2013-09-18: 40 mg via INTRAVENOUS

## 2013-09-18 MED ORDER — OXYCODONE-ACETAMINOPHEN 5-325 MG PO TABS
ORAL_TABLET | ORAL | Status: AC
  Filled 2013-09-18: qty 1

## 2013-09-18 MED ORDER — SODIUM CHLORIDE 0.9 % IR SOLN
Status: DC | PRN
  Administered 2013-09-18: 07:00:00

## 2013-09-18 MED ORDER — ONDANSETRON HCL 4 MG/2ML IJ SOLN
INTRAMUSCULAR | Status: AC
  Filled 2013-09-18: qty 2

## 2013-09-18 MED ORDER — PHENYLEPHRINE 40 MCG/ML (10ML) SYRINGE FOR IV PUSH (FOR BLOOD PRESSURE SUPPORT)
PREFILLED_SYRINGE | INTRAVENOUS | Status: AC
  Filled 2013-09-18: qty 10

## 2013-09-18 MED ORDER — DEXTROSE 50 % IV SOLN
INTRAVENOUS | Status: AC
  Filled 2013-09-18: qty 50

## 2013-09-18 MED ORDER — 0.9 % SODIUM CHLORIDE (POUR BTL) OPTIME
TOPICAL | Status: DC | PRN
  Administered 2013-09-18: 1000 mL

## 2013-09-18 MED ORDER — FENTANYL CITRATE 0.05 MG/ML IJ SOLN
INTRAMUSCULAR | Status: DC | PRN
  Administered 2013-09-18: 100 ug via INTRAVENOUS

## 2013-09-18 MED ORDER — LIDOCAINE HCL (PF) 1 % IJ SOLN
INTRAMUSCULAR | Status: DC | PRN
  Administered 2013-09-18: 3 mL via INTRADERMAL

## 2013-09-18 MED ORDER — PROPOFOL 10 MG/ML IV BOLUS
INTRAVENOUS | Status: DC | PRN
  Administered 2013-09-18: 140 mg via INTRAVENOUS

## 2013-09-18 MED FILL — Dextrose Inj 50%: INTRAVENOUS | Qty: 50 | Status: AC

## 2013-09-18 SURGICAL SUPPLY — 53 items
ADH SKN CLS APL DERMABOND .7 (GAUZE/BANDAGES/DRESSINGS) ×2
BAG BANDED W/RUBBER/TAPE 36X54 (MISCELLANEOUS) ×2 IMPLANT
BAG DECANTER FOR FLEXI CONT (MISCELLANEOUS) ×4 IMPLANT
BAG EQP BAND 135X91 W/RBR TAPE (MISCELLANEOUS) ×2
BLADE 10 SAFETY STRL DISP (BLADE) ×4 IMPLANT
CATH BEACON 5.038 65CM KMP-01 (CATHETERS) ×2 IMPLANT
CATH CANNON HEMO 15F 50CM (CATHETERS) IMPLANT
CATH CANNON HEMO 15FR 19 (HEMODIALYSIS SUPPLIES) ×2 IMPLANT
CATH CANNON HEMO 15FR 23CM (HEMODIALYSIS SUPPLIES) IMPLANT
CATH CANNON HEMO 15FR 31CM (HEMODIALYSIS SUPPLIES) IMPLANT
CATH CANNON HEMO 15FR 32 (HEMODIALYSIS SUPPLIES) IMPLANT
CATH CANNON HEMO 15FR 32CM (HEMODIALYSIS SUPPLIES) IMPLANT
COVER DOME SNAP 22 D (MISCELLANEOUS) ×2 IMPLANT
COVER PROBE W GEL 5X96 (DRAPES) ×2 IMPLANT
COVER SURGICAL LIGHT HANDLE (MISCELLANEOUS) ×4 IMPLANT
DECANTER SPIKE VIAL GLASS SM (MISCELLANEOUS) ×4 IMPLANT
DERMABOND ADVANCED (GAUZE/BANDAGES/DRESSINGS) ×2
DERMABOND ADVANCED .7 DNX12 (GAUZE/BANDAGES/DRESSINGS) IMPLANT
DRAPE C-ARM 42X72 X-RAY (DRAPES) ×4 IMPLANT
DRAPE CHEST BREAST 15X10 FENES (DRAPES) ×4 IMPLANT
GAUZE SPONGE 2X2 8PLY STRL LF (GAUZE/BANDAGES/DRESSINGS) ×2 IMPLANT
GAUZE SPONGE 4X4 16PLY XRAY LF (GAUZE/BANDAGES/DRESSINGS) ×4 IMPLANT
GLOVE BIO SURGEON STRL SZ 6.5 (GLOVE) ×1 IMPLANT
GLOVE BIO SURGEONS STRL SZ 6.5 (GLOVE) ×1
GLOVE ECLIPSE 6.5 STRL STRAW (GLOVE) ×2 IMPLANT
GLOVE SS BIOGEL STRL SZ 7 (GLOVE) ×2 IMPLANT
GLOVE SUPERSENSE BIOGEL SZ 7 (GLOVE) ×4
GOWN STRL REUS W/ TWL LRG LVL3 (GOWN DISPOSABLE) ×4 IMPLANT
GOWN STRL REUS W/TWL LRG LVL3 (GOWN DISPOSABLE) ×8
KIT BASIN OR (CUSTOM PROCEDURE TRAY) ×4 IMPLANT
KIT ROOM TURNOVER OR (KITS) ×4 IMPLANT
NDL 18GX1X1/2 (RX/OR ONLY) (NEEDLE) ×2 IMPLANT
NDL HYPO 25GX1X1/2 BEV (NEEDLE) ×2 IMPLANT
NEEDLE 18GX1X1/2 (RX/OR ONLY) (NEEDLE) ×4 IMPLANT
NEEDLE 22X1 1/2 (OR ONLY) (NEEDLE) ×4 IMPLANT
NEEDLE HYPO 25GX1X1/2 BEV (NEEDLE) ×4 IMPLANT
NS IRRIG 1000ML POUR BTL (IV SOLUTION) ×4 IMPLANT
PACK SURGICAL SETUP 50X90 (CUSTOM PROCEDURE TRAY) ×4 IMPLANT
PAD ARMBOARD 7.5X6 YLW CONV (MISCELLANEOUS) ×8 IMPLANT
SOAP 2 % CHG 4 OZ (WOUND CARE) ×4 IMPLANT
SPONGE GAUZE 2X2 STER 10/PKG (GAUZE/BANDAGES/DRESSINGS) ×2
STOPCOCK 4 WAY LG BORE MALE ST (IV SETS) ×2 IMPLANT
SUT ETHILON 3 0 PS 1 (SUTURE) ×4 IMPLANT
SUT VICRYL 4-0 PS2 18IN ABS (SUTURE) ×4 IMPLANT
SYR 20CC LL (SYRINGE) ×4 IMPLANT
SYR 5ML LL (SYRINGE) ×8 IMPLANT
SYR CONTROL 10ML LL (SYRINGE) ×4 IMPLANT
SYRINGE 10CC LL (SYRINGE) ×4 IMPLANT
TAPE CLOTH SURG 4X10 WHT LF (GAUZE/BANDAGES/DRESSINGS) ×2 IMPLANT
TOWEL OR 17X24 6PK STRL BLUE (TOWEL DISPOSABLE) ×4 IMPLANT
TOWEL OR 17X26 10 PK STRL BLUE (TOWEL DISPOSABLE) ×4 IMPLANT
WATER STERILE IRR 1000ML POUR (IV SOLUTION) ×4 IMPLANT
WIRE AMPLATZ SS-J .035X180CM (WIRE) ×2 IMPLANT

## 2013-09-18 NOTE — Anesthesia Preprocedure Evaluation (Addendum)
Anesthesia Evaluation  Patient identified by MRN, date of birth, ID band Patient awake    Reviewed: Allergy & Precautions, H&P , NPO status , Patient's Chart, lab work & pertinent test results  Airway Mallampati: II      Dental  (+) Edentulous Upper, Edentulous Lower   Pulmonary Current Smoker,  breath sounds clear to auscultation        Cardiovascular hypertension, Rhythm:Regular     Neuro/Psych    GI/Hepatic   Endo/Other  diabetes  Renal/GU      Musculoskeletal   Abdominal   Peds  Hematology   Anesthesia Other Findings   Reproductive/Obstetrics                           Anesthesia Physical Anesthesia Plan  ASA: III  Anesthesia Plan: General   Post-op Pain Management:    Induction: Intravenous  Airway Management Planned: LMA  Additional Equipment:   Intra-op Plan:   Post-operative Plan:   Informed Consent: I have reviewed the patients History and Physical, chart, labs and discussed the procedure including the risks, benefits and alternatives for the proposed anesthesia with the patient or authorized representative who has indicated his/her understanding and acceptance.     Plan Discussed with:   Anesthesia Plan Comments: (ESRD K- 4.0 Type 2 DM glucose now  S/P CVA with R. Hemiparesis Htn Difficult IV access COPD on home O2  Plan GA with LMA  Kipp Brood  )       Anesthesia Quick Evaluation

## 2013-09-18 NOTE — Progress Notes (Signed)
Spoke with dr Noreene Larsson regarding pts bp 182/92 no orders obtained at this time will administer pain med

## 2013-09-18 NOTE — Progress Notes (Signed)
Cbg 30 Dr Jean Rosenthal called and informed.  New orders noted.

## 2013-09-18 NOTE — Op Note (Signed)
OPERATIVE REPORT  Date of Surgery: 09/18/2013  Surgeon: Josephina Gip, MD  Assistant: Nurse  Pre-op Diagnosis: End Stage Renal Disease  Post-op Diagnosis: End Stage Renal Disease  Procedure: Procedure(s): REMOVAL OF A DIALYSIS CATHETER-RIGHT FEMORAL VEIN Insertion left IJ hemodialysis catheter with ultrasound guidance-23 cm Anesthesia: LMA  EBL: Minimal  Complications: None  Procedure Details: The patient was taken to operating room placed in the supine position at which time satisfactory Gen.-LMA anesthesia was administered. Left internal jugular vein was imaged using the node ultrasound-SonoSite it was noted to be large and widely patent. The upper chest and neck prepped as well as both inguinal areas. There was a femoral tunneled hemodialysis catheter on the right side which was prepped into the field  The left IJ was entered in entered using a supraclavicular approach guidewire passed across the mediastinum and initially would not traverse this into the atrium. Therefore using a Kumpe catheter the wire was directed into the right atrium and the guidewire exchanged for an Amplatz superstiff wire. After dilating the tract appropriately a 23 cm tunneled hemodialysis catheter-Diateck was positioned in the right atrium, peripherally secured with nylon sutures and the wound closed with Vicryl in a subcuticular fashion. This was done through a peel-away sheath. Attention was then turned to the right femoral area where there was a femoral tunneled catheter. After infiltration with 1% Xylocaine the cuff was dissected free through the exit site and the cath are easily removed and the exit site closed with one 3-0 nylon suture. Sterile dressings were applied patient taken to the recovery room for chest x-ray in stable condition  Josephina Gip, MD 09/18/2013 8:31 AM

## 2013-09-18 NOTE — Progress Notes (Signed)
CBg 128

## 2013-09-18 NOTE — Anesthesia Postprocedure Evaluation (Signed)
  Anesthesia Post-op Note  Patient: Marcus [name]  Procedure(s) Performed: Procedure(s): REMOVAL OF A DIALYSIS CATHETER-RIGHT FEMORAL VEIN (Right) INSERTION OF DIALYSIS CATHETER-LEFT NECK VS LEFT THIGH (Left)  Patient Location: PACU  Anesthesia Type:MAC  Level of Consciousness: awake, alert  and oriented  Airway and Oxygen Therapy: Patient Spontanous Breathing and Patient connected to nasal cannula oxygen  Post-op Pain: mild  Post-op Assessment: Post-op Vital signs reviewed, Patient's Cardiovascular Status Stable, Respiratory Function Stable, Patent Airway and Pain level controlled  Post-op Vital Signs: stable  Last Vitals:  Filed Vitals:   09/18/13 0958  BP: 170/96  Pulse: 58  Temp: 36.1 C  Resp: 15    Complications: No apparent anesthesia complications

## 2013-09-18 NOTE — H&P (Signed)
Vascular Surgery H&P  Chief Complaint: Patient needs hemodialysis catheter moved from right femoral vein if possible to attempt access-last existing spot  HPI: Marcus [name] is a 58 y.o. male who presents for evaluation of removal of right femoral dialysis catheter if unable to insert a catheter in either left IJ or left femoral region. This will enable us to attempt right thigh graft.   Past Medical History  Diagnosis Date  . Hypothyroidism   . Hyperlipidemia   . CVA (cerebrovascular accident)     hx CVA x 2  . GI bleed     2006, mallory-weiss tear  . Pulmonary edema     nov 2011, dry wt decreased  . Goiter     by CT Apr 2013  . Depression   . Cocaine abuse     past history  . Tobacco abuse     history of  . GERD (gastroesophageal reflux disease)   . Cataract   . Insomnia   . Chronic pain   . HTN (hypertension)     se[place] Dr. Ardelle [place]aque, [place]  . Neuromuscular disorder     diabetic neuropathy  . Occlusion of carotid stent     Per patients nursing aid  . Pneumonia     hx of  . Diabetes mellitus     Sees Dr. Reather LittlerAjay Kumar [phone] -- fasting blood 70-110s  . DKA (diabetic ketoacidosis)     severe, Dec 2011  . History of blood transfusion   . On home oxygen therapy     2l nasal cannula at night  . Hemodialysis patient     M, W, F  . CHF (congestive heart failure)   . DVT (deep venous thrombosis)   . ESRD (end stage renal disease) on dialysis     hd - [facility] - [place]   Past Surgical History  Procedure Laterality Date  . Av fistula placement  03/25/2008    right forearm  . Av fistula placement  04/23/2012    Procedure: INSERTION OF ARTERIOVENOUS (AV) GORE-TEX GRAFT THIGH;  Surgeon: Sherren Kernsharles E Fields, MD;  [facility];  Service: Vascular;  Laterality: Left;  . Arteriovenous graft placement Left     thigh  . Carotid stent insertion Right   . Dyalisis catheter    . Eye surgery Bilateral     cataracts  . Thyroid surgery  01/28/2013    Dr Gerrit FriendsGerkin  . Lymph node  biopsy Right 01/28/2013    Procedure: LYMPH NODE BIOPSY;  Surgeon: Velora Hecklerodd M Gerkin, MD;  [facility];  Service: General;  Laterality: Right;  . Mass biopsy N/A 01/28/2013    Procedure: NECK EXPLORATION;  Surgeon: Velora Hecklerodd M Gerkin, MD;  [facility];  Service: General;  Laterality: N/A;  . Ligation arteriovenous gortex graft Right 02/27/2013    Procedure: LIGATION ARTERIOVENOUS GORTEX GRAFT;  Surgeon: Chuck Hinthristopher S Dickson, MD;  [facility];  Service: Vascular;  Laterality: Right;  . Ligation arteriovenous gortex graft Left 03/03/2013    Procedure: LIGATION Left ARTERIOVENOUS GORTEX Thigh GRAFT;  Surgeon: Fransisco HertzBrian L Chen, MD;  [facility];  Service: Vascular;  Laterality: Left;  . Insertion of dialysis catheter Right 03/03/2013    Procedure: INSERTION OF DIALYSIS CATHETER;  Surgeon: Fransisco HertzBrian L Chen, MD;  [facility];  Service: Vascular;  Laterality: Right;  . Av fistula placement Left 03/14/2013    Procedure: INSERTION OF  ARTERIOVENOUS (AV) GORE-TEX GRAFT  LEFT THIGH;  Surgeon: Larina Earthlyodd F Early, MD;  [facility];  Service: Vascular;  Laterality: Left;  . Avgg removal Left 04/16/2013    Procedure: REMOVAL OF ARTERIOVENOUS GORETEX GRAFT Left Thigh;  Surgeon: Nada Libman, MD;  Location: [facility] OR;  Service: Vascular;  Laterality: Left;  . Patch angioplasty Left 04/16/2013    Procedure: Patch Angioplasty of left femoral artery;  Surgeon: Nada Libman, MD;  Location[facility] OR;  Service: Vascular;  Laterality: Left;  . Colonoscopy  09/16/13   History   Social History  . Marital Status: Married    Spouse Name: N/A    Number of Children: N/A  . Years of Education: N/A   Social History Main Topics  . Smoking status: Current Every Day Smoker -- 0.25 packs/day    Types: Cigarettes  . Smokeless tobacco: Never Used     Comment: pt states that he has been thinking about giving it up  . Alcohol Use: No  . Drug Use: No  . Sexual Activity: None   Other Topics Concern  . None   Social  History Narrative  . None   Family History  Problem Relation Age of Onset  . Diabetes Sister   . Diabetes Brother   . Hypertension Brother   . Hypertension Mother   . Heart attack Mother   . Hypertension Father    Allergies  Allergen Reactions  . Penicillins Other (See Comments)    Unknown reaction   Prior to Admission medications   Medication Sig Start Date End Date Taking? Authorizing Provider  amLODipine (NORVASC) 10 MG tablet Take 1 tablet (10 mg total) by mouth at bedtime. 07/15/13  Yes Meredeth Ide, MD  calcium acetate (PHOSLO) 667 MG capsule Take 667 mg by mouth 3 (three) times daily with meals.    Yes Historical Provider, MD  cloNIDine (CATAPRES) 0.1 MG tablet Take 1 tablet (0.1 mg total) by mouth 3 (three) times daily. 07/15/13  Yes Meredeth Ide, MD  docusate sodium (COLACE) 100 MG capsule Take 100 mg by mouth 2 (two) times daily.    Yes Historical Provider, MD  enoxaparin (LOVENOX) 60 MG/0.6ML injection Inject 60 mg into the skin every 12 (twelve) hours.   Yes Historical Provider, MD  FLUoxetine (PROZAC) 10 MG tablet Take 0.5 tablets (5 mg total) by mouth every morning. 04/21/13  Yes Esperanza Sheets, MD  insulin aspart (NOVOLOG) 100 UNIT/ML injection Inject 0-5 Units into the skin 3 (three) times daily before meals.   Yes Historical Provider, MD  insulin glargine (LANTUS) 100 UNIT/ML injection Inject 0.08 mLs (8 Units total) into the skin 2 (two) times daily. 07/15/13  Yes Meredeth Ide, MD  levothyroxine (SYNTHROID, LEVOTHROID) 112 MCG tablet Take 1.5 tablets (168 mcg total) by mouth daily before breakfast. 04/21/13  Yes Esperanza Sheets, MD  metoCLOPramide (REGLAN) 5 MG tablet Take 1 tablet (5 mg total) by mouth every 6 (six) hours as needed for nausea. 05/28/13  Yes Christiane Ha, MD  metoprolol (LOPRESSOR) 50 MG tablet Take 1.5 tablets (75 mg total) by mouth 2 (two) times daily. Hold morning dose on dialysis days 04/21/13  Yes Esperanza Sheets, MD  omeprazole (PRILOSEC) 20 MG  capsule Take 20 mg by mouth daily.   Yes Historical Provider, MD  pravastatin (PRAVACHOL) 10 MG tablet Take 10 mg by mouth at bedtime.   Yes Historical Provider, MD  risperiDONE (RISPERDAL) 0.5 MG tablet Take 0.5 mg by mouth 2 (two) times daily as needed. 06/13/13  Yes Historical Provider, MD  sevelamer carbonate (RENVELA)  800 MG tablet Take 800 mg by mouth 3 (three) times daily with meals.  10/23/12  Yes Historical Provider, MD  temazepam (RESTORIL) 15 MG capsule Take 15 mg by mouth See admin instructions. Take on Monday, Wednesday, Friday, and Saturday night   Yes Historical Provider, MD  warfarin (COUMADIN) 7.5 MG tablet Take 1 tablet (7.5 mg total) by mouth daily. 07/15/13  Yes Meredeth Ide, MD  PROAIR HFA 108 (90 BASE) MCG/ACT inhaler Inhale 2 puffs into the lungs every 4 (four) hours as needed for wheezing or shortness of breath.  12/08/12   Historical Provider, MD     Positive ROS:   All other systems have been reviewed and were otherwise negative with the exception of those mentioned in the HPI and as above.  Physical Exam: Filed Vitals:   09/18/13 0621  BP: 187/83  Pulse: 58  Temp: 97.8 F (36.6 C)  Resp: 16    General: Alert, no acute distress HEENT: Normal for age Cardiovascular: Regular rate and rhythm. Carotid pulses 2+, no bruits audible Respiratory: Clear to auscultation. No cyanosis, no use of accessory musculature GI: No organomegaly, abdomen is soft and non-tender Skin: No lesions in the area of chief complaint Neurologic: Sensation intact distally Psychiatric: Patient is competent for consent with normal mood and affect Musculoskeletal: No obvious deformities Extremities: Femoral dialysis catheter on right. 2+ left femoral pulse palpable where graft has been removed  Labs reviewed:   Imaging reviewed:    Assessment/Plan:  Plan attempted insertion left IJ dialysis catheter if unable to do this we'll try a left femoral dialysis catheter and if successful will  remove right femoral dialysis cath   Josephina Gip, MD 09/18/2013 7:32 AM

## 2013-09-18 NOTE — Progress Notes (Signed)
I stat cbg was 74 after dextrose was given

## 2013-09-18 NOTE — Telephone Encounter (Addendum)
Message copied by Rosalyn Charters on Thu Sep 18, 2013 10:22 AM ------      Message from: Lorin Mercy K      Created: Thu Sep 18, 2013  9:06 AM      Regarding: Schedule                   ----- Message -----         From: Pryor Ochoa, MD         Sent: 09/18/2013   8:36 AM           To: Vvs Charge Pool            09/18/2013      Surgeon Dr. Hart Rochester      #1 ultrasound localization left internal jugular vein      #2 insertion of tunneled hemodialysis catheter left IJ      #3 removal right femoral tunneled hemodialysis catheter            The patient needs appointment to be seen in the office in 6 weeks for consideration for right thigh AV graft ------  notified patient of fu appt. with dr. Hart Rochester on 11-11-13 at 9:30 am

## 2013-09-18 NOTE — Transfer of Care (Signed)
Immediate Anesthesia Transfer of Care Note  Patient: Marcus [name]  Procedure(s) Performed: Procedure(s): REMOVAL OF A DIALYSIS CATHETER-RIGHT FEMORAL VEIN (Right) INSERTION OF DIALYSIS CATHETER-LEFT NECK VS LEFT THIGH (Left)  Patient Location: PACU  Anesthesia Type:General  Level of Consciousness: awake, sedated, patient cooperative and responds to stimulation  Airway & Oxygen Therapy: Patient Spontanous Breathing and Patient connected to nasal cannula oxygen  Post-op Assessment: Report given to PACU RN, Post -op Vital signs reviewed and stable and Patient moving all extremities  Post vital signs: Reviewed and stable  Complications: No apparent anesthesia complications

## 2013-09-19 ENCOUNTER — Encounter (HOSPITAL_COMMUNITY): Payer: Self-pay | Admitting: Vascular Surgery

## 2013-11-10 ENCOUNTER — Encounter: Payer: Self-pay | Admitting: Vascular Surgery

## 2013-11-11 ENCOUNTER — Ambulatory Visit: Payer: Medicare Other | Admitting: Vascular Surgery

## 2013-11-17 ENCOUNTER — Encounter: Payer: Self-pay | Admitting: Vascular Surgery

## 2013-11-18 ENCOUNTER — Encounter: Payer: Self-pay | Admitting: Vascular Surgery

## 2013-11-18 ENCOUNTER — Ambulatory Visit (INDEPENDENT_AMBULATORY_CARE_PROVIDER_SITE_OTHER): Payer: Medicare Other | Admitting: Vascular Surgery

## 2013-11-18 VITALS — BP 158/84 | HR 75 | Ht 73.0 in | Wt 129.5 lb

## 2013-11-18 DIAGNOSIS — N186 End stage renal disease: Secondary | ICD-10-CM

## 2013-11-18 NOTE — Progress Notes (Signed)
Subjective:     Patient ID: Marcus [name], male   DOB: [birth year], 58 y.o.   MRN: [id number]  HPI this 58 year old male with end-stage renal disease is seen for evaluation for right thigh AV graft. Patient has had flail left thigh AV graft and bilateral upper extremity grafts. He currently has a hemodialysis catheter in the left IJ. He had the right femoral catheter removed by me on June 4. He has never had a graft in the right thigh area. He is not on Coumadin. He dialyzes Monday Wednesday Friday. [place] in [place] and has transportation issues.  Past Medical History  Diagnosis Date  . Hypothyroidism   . Hyperlipidemia   . CVA (cerebrovascular accident)     hx CVA x 2  . GI bleed     2006, mallory-weiss tear  . Pulmonary edema     nov 2011, dry wt decreased  . Goiter     by CT Apr 2013  . Depression   . Cocaine abuse     past history  . Tobacco abuse     history of  . GERD (gastroesophageal reflux disease)   . Cataract   . Insomnia   . Chronic pain   . HTN (hypertension)     sees Dr. Ardelle Park, Rosalita [place]  . Neuromuscular disorder     diabetic neuropathy  . Occlusion of carotid stent     Per patients nursing aid  . Pneumonia     hx of  . Diabetes mellitus     Sees Dr. Reather Littler [phone] -- fasting blood 70-110s  . DKA (diabetic ketoacidosis)     severe, Dec 2011  . History of blood transfusion   . On home oxygen therapy     2l nasal cannula at night  . Hemodialysis patient     M, W, F  . CHF (congestive heart failure)   . DVT (deep venous thrombosis)   . ESRD (end stage renal disease) on dialysis     hd - [facility] - [place]  . COPD (chronic obstructive pulmonary disease)     History  Substance Use Topics  . Smoking status: Current Every Day Smoker -- 0.25 packs/day    Types: Cigarettes  . Smokeless tobacco: Never Used     Comment: pt states that he has been thinking about giving it up  . Alcohol Use: No    Family History  Problem Relation Age of Onset   . Diabetes Sister   . Diabetes Brother   . Hypertension Brother   . Heart disease Brother   . Hypertension Mother   . Heart attack Mother   . Hyperlipidemia Mother   . Heart disease Mother     before age 59  . Hypertension Father     Allergies  Allergen Reactions  . Penicillins Other (See Comments)    Unknown reaction    Current outpatient prescriptions:amLODipine (NORVASC) 10 MG tablet, Take 1 tablet (10 mg total) by mouth at bedtime., Disp: 30 tablet, Rfl: 2;  calcium acetate (PHOSLO) 667 MG capsule, Take 667 mg by mouth 3 (three) times daily with meals. , Disp: , Rfl: ;  cloNIDine (CATAPRES) 0.1 MG tablet, Take 1 tablet (0.1 mg total) by mouth 3 (three) times daily., Disp: 60 tablet, Rfl: 11 docusate sodium (COLACE) 100 MG capsule, Take 100 mg by mouth 2 (two) times daily. , Disp: , Rfl: ;  enoxaparin (LOVENOX) 60 MG/0.6ML injection, Inject 60 mg into the skin every 12 (twelve) hours.,  Disp: , Rfl: ;  FLUoxetine (PROZAC) 10 MG tablet, Take 0.5 tablets (5 mg total) by mouth every morning., Disp: 30 tablet, Rfl: 3 insulin aspart (NOVOLOG) 100 UNIT/ML injection, Inject 0-5 Units into the skin 3 (three) times daily before meals., Disp: , Rfl: ;  insulin glargine (LANTUS) 100 UNIT/ML injection, Inject 0.08 mLs (8 Units total) into the skin 2 (two) times daily., Disp: 10 mL, Rfl: 11;  levothyroxine (SYNTHROID, LEVOTHROID) 112 MCG tablet, Take 1.5 tablets (168 mcg total) by mouth daily before breakfast., Disp: 30 tablet, Rfl: 1 metoCLOPramide (REGLAN) 5 MG tablet, Take 1 tablet (5 mg total) by mouth every 6 (six) hours as needed for nausea., Disp: 30 tablet, Rfl: 0;  metoprolol (LOPRESSOR) 50 MG tablet, Take 1.5 tablets (75 mg total) by mouth 2 (two) times daily. Hold morning dose on dialysis days, Disp: 60 tablet, Rfl: 2;  omeprazole (PRILOSEC) 20 MG capsule, Take 20 mg by mouth daily., Disp: , Rfl:  oxyCODONE-acetaminophen (PERCOCET/ROXICET) 5-325 MG per tablet, Take 1 tablet by mouth every 6  (six) hours as needed., Disp: 30 tablet, Rfl: 0;  pravastatin (PRAVACHOL) 10 MG tablet, Take 10 mg by mouth at bedtime., Disp: , Rfl: ;  PROAIR HFA 108 (90 BASE) MCG/ACT inhaler, Inhale 2 puffs into the lungs every 4 (four) hours as needed for wheezing or shortness of breath. , Disp: , Rfl:  sevelamer carbonate (RENVELA) 800 MG tablet, Take 800 mg by mouth 3 (three) times daily with meals. , Disp: , Rfl: ;  temazepam (RESTORIL) 15 MG capsule, Take 15 mg by mouth See admin instructions. Take on Monday, Wednesday, Friday, and Saturday night, Disp: , Rfl: ;  risperiDONE (RISPERDAL) 0.5 MG tablet, Take 0.5 mg by mouth 2 (two) times daily as needed., Disp: , Rfl:  warfarin (COUMADIN) 7.5 MG tablet, Take 1 tablet (7.5 mg total) by mouth daily., Disp: 30 tablet, Rfl: 2 No current facility-administered medications for this visit. Facility-Administered Medications Ordered in Other Visits: vancomycin (VANCOCIN) IVPB 1000 mg/200 mL premix, 1,000 mg, Intravenous, 60 min Pre-Op, Nada LibmanVance W Brabham, MD  BP 158/84  Pulse 75  Ht 6\' 1"  (1.854 m)  Wt 129 lb 8 oz (58.741 kg)  BMI 17.09 kg/m2  SpO2 100%  Body mass index is 17.09 kg/(m^2).           Review of Systems     Objective:   Physical Exam BP 158/84  Pulse 75  Ht 6\' 1"  (1.854 m)  Wt 129 lb 8 oz (58.741 kg)  BMI 17.09 kg/m2  SpO2 100%  Gen.-alert and oriented x3 in no apparent distress-chronically ill in appearance HEENT normal for age Lungs no rhonchi or wheezing-hemodialysis catheter left upper chest wall Cardiovascular regular rhythm no murmurs carotid pulses 3+ palpable no bruits audible Abdomen soft nontender no palpable masses Musculoskeletal free of  major deformities Skin clear -no rashes Neurologic normal Lower extremities 3+ femoral and dorsalis pedis pulses palpable bilaterally with no edema-occluded AV graft left thigh area       Assessment:     Discussed with patient the need for right thigh AV graft. Have offered to  insert this on a Tuesday or Thursday in the near future but he refuses at the present time. Not sure about transportation. He states that they will call back to schedule this.    Plan:     We will schedule patient for insertion right thigh AV graft as outpatient to be done in the near future as soon as patient and  family gives [place] the okay. This will need to be done on a Tuesday or Thursday. He dialyzes on Monday Wednesday and Friday.

## 2013-12-16 ENCOUNTER — Other Ambulatory Visit: Payer: Self-pay

## 2013-12-29 ENCOUNTER — Encounter (HOSPITAL_COMMUNITY): Payer: Self-pay | Admitting: *Deleted

## 2013-12-29 MED ORDER — VANCOMYCIN HCL IN DEXTROSE 1-5 GM/200ML-% IV SOLN
1000.0000 mg | INTRAVENOUS | Status: AC
  Administered 2013-12-30: 1000 mg via INTRAVENOUS
  Filled 2013-12-29: qty 200

## 2013-12-29 NOTE — Progress Notes (Signed)
Pre-op assessment completed by both pt and pt sister; neither was sure about pt taking Coumadin since [place], CNA usually handles pt medications and will not be available until DOS.

## 2013-12-29 NOTE — Progress Notes (Signed)
12/29/13 1748  OBSTRUCTIVE SLEEP APNEA  Have you ever been diagnosed with sleep apnea through a sleep study? No  Do you snore loudly (loud enough to be heard through closed doors)?  1  Do you often feel tired, fatigued, or sleepy during the daytime? 1  Has anyone observed you stop breathing during your sleep? 1  Do you have, or are you being treated for high blood pressure? 1  BMI more than 35 kg/m2? 0  Age over 58 years old? 1  Gender: 1  Obstructive Sleep Apnea Score 6

## 2013-12-30 ENCOUNTER — Observation Stay (HOSPITAL_COMMUNITY)
Admission: RE | Admit: 2013-12-30 | Discharge: 2013-12-31 | Disposition: A | Discharge status: Home / Self Care | Payer: Medicare Other | Source: Ambulatory Visit | Attending: Vascular Surgery | Admitting: Vascular Surgery

## 2013-12-30 ENCOUNTER — Encounter (HOSPITAL_COMMUNITY): Payer: Medicare Other | Admitting: Anesthesiology

## 2013-12-30 ENCOUNTER — Telehealth: Payer: Self-pay | Admitting: Vascular Surgery

## 2013-12-30 ENCOUNTER — Inpatient Hospital Stay (HOSPITAL_COMMUNITY): Payer: Medicare Other | Admitting: Anesthesiology

## 2013-12-30 ENCOUNTER — Encounter (HOSPITAL_COMMUNITY): Payer: Self-pay | Admitting: Certified Registered"

## 2013-12-30 ENCOUNTER — Encounter (HOSPITAL_COMMUNITY)
Admission: RE | Disposition: A | Discharge status: Home / Self Care | Payer: Self-pay | Source: Ambulatory Visit | Attending: Vascular Surgery

## 2013-12-30 DIAGNOSIS — Z79899 Other long term (current) drug therapy: Secondary | ICD-10-CM | POA: Insufficient documentation

## 2013-12-30 DIAGNOSIS — E785 Hyperlipidemia, unspecified: Secondary | ICD-10-CM | POA: Insufficient documentation

## 2013-12-30 DIAGNOSIS — I509 Heart failure, unspecified: Secondary | ICD-10-CM | POA: Diagnosis not present

## 2013-12-30 DIAGNOSIS — Z8673 Personal history of transient ischemic attack (TIA), and cerebral infarction without residual deficits: Secondary | ICD-10-CM | POA: Diagnosis not present

## 2013-12-30 DIAGNOSIS — E119 Type 2 diabetes mellitus without complications: Secondary | ICD-10-CM | POA: Insufficient documentation

## 2013-12-30 DIAGNOSIS — Z992 Dependence on renal dialysis: Secondary | ICD-10-CM | POA: Insufficient documentation

## 2013-12-30 DIAGNOSIS — I120 Hypertensive chronic kidney disease with stage 5 chronic kidney disease or end stage renal disease: Secondary | ICD-10-CM | POA: Diagnosis not present

## 2013-12-30 DIAGNOSIS — Z7901 Long term (current) use of anticoagulants: Secondary | ICD-10-CM | POA: Insufficient documentation

## 2013-12-30 DIAGNOSIS — F329 Major depressive disorder, single episode, unspecified: Secondary | ICD-10-CM | POA: Diagnosis not present

## 2013-12-30 DIAGNOSIS — J449 Chronic obstructive pulmonary disease, unspecified: Secondary | ICD-10-CM | POA: Insufficient documentation

## 2013-12-30 DIAGNOSIS — F17200 Nicotine dependence, unspecified, uncomplicated: Secondary | ICD-10-CM | POA: Insufficient documentation

## 2013-12-30 DIAGNOSIS — Z794 Long term (current) use of insulin: Secondary | ICD-10-CM | POA: Insufficient documentation

## 2013-12-30 DIAGNOSIS — K219 Gastro-esophageal reflux disease without esophagitis: Secondary | ICD-10-CM | POA: Diagnosis not present

## 2013-12-30 DIAGNOSIS — E039 Hypothyroidism, unspecified: Secondary | ICD-10-CM | POA: Insufficient documentation

## 2013-12-30 DIAGNOSIS — N189 Chronic kidney disease, unspecified: Secondary | ICD-10-CM | POA: Diagnosis present

## 2013-12-30 DIAGNOSIS — N186 End stage renal disease: Secondary | ICD-10-CM

## 2013-12-30 DIAGNOSIS — F3289 Other specified depressive episodes: Secondary | ICD-10-CM | POA: Insufficient documentation

## 2013-12-30 DIAGNOSIS — Z9981 Dependence on supplemental oxygen: Secondary | ICD-10-CM | POA: Diagnosis not present

## 2013-12-30 DIAGNOSIS — J4489 Other specified chronic obstructive pulmonary disease: Secondary | ICD-10-CM | POA: Insufficient documentation

## 2013-12-30 HISTORY — PX: AV FISTULA PLACEMENT: SHX1204

## 2013-12-30 LAB — PROTIME-INR
INR: 1.93 — ABNORMAL HIGH (ref 0.00–1.49)
Prothrombin Time: 22.1 seconds — ABNORMAL HIGH (ref 11.6–15.2)

## 2013-12-30 LAB — BLOOD GAS, ARTERIAL
ACID-BASE EXCESS: 4.6 mmol/L — AB (ref 0.0–2.0)
BICARBONATE: 29.5 meq/L — AB (ref 20.0–24.0)
Drawn by: 23588
FIO2: 0.21 %
O2 Saturation: 94.7 %
PATIENT TEMPERATURE: 98.6
PH ART: 7.376 (ref 7.350–7.450)
TCO2: 31.1 mmol/L (ref 0–100)
pCO2 arterial: 51.4 mmHg — ABNORMAL HIGH (ref 35.0–45.0)
pO2, Arterial: 71.6 mmHg — ABNORMAL LOW (ref 80.0–100.0)

## 2013-12-30 LAB — GLUCOSE, CAPILLARY
GLUCOSE-CAPILLARY: 249 mg/dL — AB (ref 70–99)
Glucose-Capillary: 125 mg/dL — ABNORMAL HIGH (ref 70–99)
Glucose-Capillary: 102 mg/dL — ABNORMAL HIGH (ref 70–99)
Glucose-Capillary: 164 mg/dL — ABNORMAL HIGH (ref 70–99)
Glucose-Capillary: 360 mg/dL — ABNORMAL HIGH (ref 70–99)

## 2013-12-30 LAB — POCT I-STAT 4, (NA,K, GLUC, HGB,HCT)
GLUCOSE: 175 mg/dL — AB (ref 70–99)
HEMATOCRIT: 28 % — AB (ref 39.0–52.0)
HEMOGLOBIN: 9.5 g/dL — AB (ref 13.0–17.0)
POTASSIUM: 3.5 meq/L — AB (ref 3.7–5.3)
SODIUM: 135 meq/L — AB (ref 137–147)

## 2013-12-30 LAB — APTT: aPTT: >200 seconds (ref 24–37)

## 2013-12-30 SURGERY — INSERTION OF ARTERIOVENOUS (AV) GORE-TEX GRAFT THIGH
Anesthesia: General | Site: Leg Upper | Laterality: Right

## 2013-12-30 MED ORDER — GUAIFENESIN-DM 100-10 MG/5ML PO SYRP: 15.0000 mL | ORAL_SOLUTION | ORAL | Status: DC | PRN

## 2013-12-30 MED ORDER — SODIUM CHLORIDE 0.9 % IJ SOLN
INTRAMUSCULAR | Status: AC
  Filled 2013-12-30: qty 10

## 2013-12-30 MED ORDER — METOCLOPRAMIDE HCL 5 MG PO TABS
5.0000 mg | ORAL_TABLET | Freq: Four times a day (QID) | ORAL | Status: DC | PRN
  Filled 2013-12-30: qty 1

## 2013-12-30 MED ORDER — RISPERIDONE 0.5 MG PO TABS
0.5000 mg | ORAL_TABLET | Freq: Two times a day (BID) | ORAL | Status: DC
  Administered 2013-12-30 – 2013-12-31 (×2): 0.5 mg via ORAL
  Filled 2013-12-30 (×4): qty 1

## 2013-12-30 MED ORDER — WARFARIN - PHYSICIAN DOSING INPATIENT
Freq: Every day | Status: DC
  Administered 2013-12-30: 18:00:00

## 2013-12-30 MED ORDER — SEVELAMER CARBONATE 800 MG PO TABS
800.0000 mg | ORAL_TABLET | Freq: Three times a day (TID) | ORAL | Status: DC
  Administered 2013-12-31: 800 mg via ORAL
  Filled 2013-12-30 (×6): qty 1

## 2013-12-30 MED ORDER — OXYCODONE-ACETAMINOPHEN 5-325 MG PO TABS: 1.0000 | ORAL_TABLET | ORAL | Status: DC | PRN

## 2013-12-30 MED ORDER — SUCCINYLCHOLINE CHLORIDE 20 MG/ML IJ SOLN
INTRAMUSCULAR | Status: DC | PRN
  Administered 2013-12-30: 20 mg via INTRAVENOUS

## 2013-12-30 MED ORDER — HYDRALAZINE HCL 20 MG/ML IJ SOLN: 10.0000 mg | INTRAMUSCULAR | Status: DC | PRN

## 2013-12-30 MED ORDER — ONDANSETRON HCL 4 MG/2ML IJ SOLN: 4.0000 mg | Freq: Four times a day (QID) | INTRAMUSCULAR | Status: DC | PRN

## 2013-12-30 MED ORDER — SODIUM CHLORIDE 0.9 % IV SOLN: 250.0000 mL | INTRAVENOUS | Status: DC | PRN

## 2013-12-30 MED ORDER — INSULIN ASPART 100 UNIT/ML ~~LOC~~ SOLN: 0.0000 [IU] | Freq: Three times a day (TID) | SUBCUTANEOUS | Status: DC

## 2013-12-30 MED ORDER — NEPRO/CARBSTEADY PO LIQD
237.0000 mL | Freq: Two times a day (BID) | ORAL | Status: DC
  Administered 2013-12-31: 237 mL via ORAL
  Filled 2013-12-30 (×4): qty 237

## 2013-12-30 MED ORDER — MEPERIDINE HCL 25 MG/ML IJ SOLN: 6.2500 mg | INTRAMUSCULAR | Status: DC | PRN

## 2013-12-30 MED ORDER — DOCUSATE SODIUM 100 MG PO CAPS
100.0000 mg | ORAL_CAPSULE | Freq: Two times a day (BID) | ORAL | Status: DC
  Filled 2013-12-30: qty 1

## 2013-12-30 MED ORDER — WARFARIN SODIUM 7.5 MG PO TABS
7.5000 mg | ORAL_TABLET | Freq: Every day | ORAL | Status: DC
  Administered 2013-12-30: 7.5 mg via ORAL
  Filled 2013-12-30 (×3): qty 1

## 2013-12-30 MED ORDER — CLONIDINE HCL 0.1 MG PO TABS
0.1000 mg | ORAL_TABLET | Freq: Three times a day (TID) | ORAL | Status: DC
  Administered 2013-12-30 – 2013-12-31 (×3): 0.1 mg via ORAL
  Filled 2013-12-30 (×5): qty 1

## 2013-12-30 MED ORDER — CALCIUM ACETATE 667 MG PO CAPS
667.0000 mg | ORAL_CAPSULE | Freq: Three times a day (TID) | ORAL | Status: DC
  Administered 2013-12-31: 667 mg via ORAL
  Filled 2013-12-30 (×6): qty 1

## 2013-12-30 MED ORDER — FLUOXETINE HCL 20 MG/5ML PO SOLN
5.0000 mg | Freq: Every morning | ORAL | Status: DC
  Administered 2013-12-31: 5 mg via ORAL
  Filled 2013-12-30: qty 5

## 2013-12-30 MED ORDER — NALOXONE HCL 0.4 MG/ML IJ SOLN
INTRAMUSCULAR | Status: AC
  Administered 2013-12-30: 0.4 mg
  Filled 2013-12-30: qty 1

## 2013-12-30 MED ORDER — FENTANYL CITRATE 0.05 MG/ML IJ SOLN: 25.0000 ug | INTRAMUSCULAR | Status: DC | PRN

## 2013-12-30 MED ORDER — ONDANSETRON HCL 4 MG/2ML IJ SOLN
INTRAMUSCULAR | Status: DC | PRN
  Administered 2013-12-30: 4 mg via INTRAVENOUS

## 2013-12-30 MED ORDER — TEMAZEPAM 15 MG PO CAPS: 15.0000 mg | ORAL_CAPSULE | ORAL | Status: DC

## 2013-12-30 MED ORDER — PHENOL 1.4 % MT LIQD
1.0000 | OROMUCOSAL | Status: DC | PRN
  Filled 2013-12-30: qty 177

## 2013-12-30 MED ORDER — EPHEDRINE SULFATE 50 MG/ML IJ SOLN
INTRAMUSCULAR | Status: AC
  Filled 2013-12-30: qty 1

## 2013-12-30 MED ORDER — ENOXAPARIN SODIUM 60 MG/0.6ML ~~LOC~~ SOLN: 60.0000 mg | Freq: Two times a day (BID) | SUBCUTANEOUS | Status: DC

## 2013-12-30 MED ORDER — FLUOXETINE HCL 10 MG PO TABS: 5.0000 mg | ORAL_TABLET | Freq: Every morning | ORAL | Status: DC

## 2013-12-30 MED ORDER — INSULIN ASPART 100 UNIT/ML ~~LOC~~ SOLN
0.0000 [IU] | Freq: Three times a day (TID) | SUBCUTANEOUS | Status: DC
  Administered 2013-12-30: 2 [IU] via SUBCUTANEOUS
  Administered 2013-12-31: 1 [IU] via SUBCUTANEOUS

## 2013-12-30 MED ORDER — SODIUM CHLORIDE 0.9 % IR SOLN
Status: DC | PRN
  Administered 2013-12-30: 08:00:00

## 2013-12-30 MED ORDER — INSULIN GLARGINE 100 UNIT/ML ~~LOC~~ SOLN
8.0000 [IU] | Freq: Two times a day (BID) | SUBCUTANEOUS | Status: DC
  Administered 2013-12-30 – 2013-12-31 (×2): 8 [IU] via SUBCUTANEOUS
  Filled 2013-12-30 (×4): qty 0.08

## 2013-12-30 MED ORDER — CHLORHEXIDINE GLUCONATE CLOTH 2 % EX PADS: 6.0000 | MEDICATED_PAD | Freq: Once | CUTANEOUS | Status: DC

## 2013-12-30 MED ORDER — DEXAMETHASONE SODIUM PHOSPHATE 4 MG/ML IJ SOLN
INTRAMUSCULAR | Status: AC
  Filled 2013-12-30: qty 1

## 2013-12-30 MED ORDER — FENTANYL CITRATE 0.05 MG/ML IJ SOLN
INTRAMUSCULAR | Status: DC | PRN
  Administered 2013-12-30 (×3): 50 ug via INTRAVENOUS

## 2013-12-30 MED ORDER — DEXAMETHASONE SODIUM PHOSPHATE 4 MG/ML IJ SOLN
INTRAMUSCULAR | Status: DC | PRN
  Administered 2013-12-30: 4 mg via INTRAVENOUS

## 2013-12-30 MED ORDER — SUCCINYLCHOLINE CHLORIDE 20 MG/ML IJ SOLN
INTRAMUSCULAR | Status: AC
  Filled 2013-12-30: qty 1

## 2013-12-30 MED ORDER — ALUM & MAG HYDROXIDE-SIMETH 200-200-20 MG/5ML PO SUSP: 15.0000 mL | ORAL | Status: DC | PRN

## 2013-12-30 MED ORDER — ONDANSETRON HCL 4 MG/2ML IJ SOLN
INTRAMUSCULAR | Status: AC
  Filled 2013-12-30: qty 2

## 2013-12-30 MED ORDER — PROPOFOL 10 MG/ML IV BOLUS
INTRAVENOUS | Status: AC
  Filled 2013-12-30: qty 20

## 2013-12-30 MED ORDER — THROMBIN 20000 UNITS EX SOLR
CUTANEOUS | Status: DC | PRN
  Administered 2013-12-30: 09:00:00 via TOPICAL

## 2013-12-30 MED ORDER — 0.9 % SODIUM CHLORIDE (POUR BTL) OPTIME
TOPICAL | Status: DC | PRN
  Administered 2013-12-30: 1000 mL

## 2013-12-30 MED ORDER — PROMETHAZINE HCL 25 MG/ML IJ SOLN: 6.2500 mg | INTRAMUSCULAR | Status: DC | PRN

## 2013-12-30 MED ORDER — THROMBIN 20000 UNITS EX SOLR
CUTANEOUS | Status: AC
  Filled 2013-12-30: qty 20000

## 2013-12-30 MED ORDER — FENTANYL CITRATE 0.05 MG/ML IJ SOLN
INTRAMUSCULAR | Status: AC
  Filled 2013-12-30: qty 5

## 2013-12-30 MED ORDER — SODIUM CHLORIDE 0.9 % IV SOLN: INTRAVENOUS | Status: DC

## 2013-12-30 MED ORDER — PROPOFOL 10 MG/ML IV BOLUS
INTRAVENOUS | Status: DC | PRN
  Administered 2013-12-30: 120 mg via INTRAVENOUS

## 2013-12-30 MED ORDER — ENOXAPARIN SODIUM 60 MG/0.6ML ~~LOC~~ SOLN
60.0000 mg | SUBCUTANEOUS | Status: DC
  Administered 2013-12-30: 60 mg via SUBCUTANEOUS
  Filled 2013-12-30 (×3): qty 0.6

## 2013-12-30 MED ORDER — EPHEDRINE SULFATE 50 MG/ML IJ SOLN
INTRAMUSCULAR | Status: DC | PRN
  Administered 2013-12-30: 5 mg via INTRAVENOUS
  Administered 2013-12-30 (×2): 10 mg via INTRAVENOUS

## 2013-12-30 MED ORDER — AMLODIPINE BESYLATE 10 MG PO TABS
10.0000 mg | ORAL_TABLET | Freq: Every day | ORAL | Status: DC
  Administered 2013-12-30: 10 mg via ORAL
  Filled 2013-12-30 (×2): qty 1

## 2013-12-30 MED ORDER — HEPARIN SODIUM (PORCINE) 1000 UNIT/ML IJ SOLN
INTRAMUSCULAR | Status: DC | PRN
  Administered 2013-12-30: 5000 [IU] via INTRAVENOUS

## 2013-12-30 MED ORDER — METOPROLOL TARTRATE 1 MG/ML IV SOLN: 2.0000 mg | INTRAVENOUS | Status: DC | PRN

## 2013-12-30 MED ORDER — SODIUM CHLORIDE 0.9 % IV SOLN
INTRAVENOUS | Status: DC | PRN
  Administered 2013-12-30 (×2): via INTRAVENOUS

## 2013-12-30 MED ORDER — LIDOCAINE HCL (CARDIAC) 20 MG/ML IV SOLN
INTRAVENOUS | Status: AC
  Filled 2013-12-30: qty 5

## 2013-12-30 MED ORDER — SODIUM CHLORIDE 0.9 % IJ SOLN: 3.0000 mL | INTRAMUSCULAR | Status: DC | PRN

## 2013-12-30 MED ORDER — METOPROLOL TARTRATE 50 MG PO TABS
75.0000 mg | ORAL_TABLET | Freq: Two times a day (BID) | ORAL | Status: DC
  Administered 2013-12-30 – 2013-12-31 (×2): 75 mg via ORAL
  Filled 2013-12-30 (×3): qty 1

## 2013-12-30 MED ORDER — OXYCODONE-ACETAMINOPHEN 5-325 MG PO TABS: 1.0000 | ORAL_TABLET | Freq: Four times a day (QID) | ORAL | Status: DC | PRN

## 2013-12-30 MED ORDER — LIDOCAINE HCL (CARDIAC) 20 MG/ML IV SOLN
INTRAVENOUS | Status: DC | PRN
  Administered 2013-12-30: 40 mg via INTRAVENOUS

## 2013-12-30 MED ORDER — DOCUSATE SODIUM 50 MG/5ML PO LIQD
100.0000 mg | Freq: Two times a day (BID) | ORAL | Status: AC
  Administered 2013-12-30 – 2013-12-31 (×2): 100 mg via ORAL
  Filled 2013-12-30 (×2): qty 10

## 2013-12-30 MED ORDER — LABETALOL HCL 5 MG/ML IV SOLN
10.0000 mg | INTRAVENOUS | Status: DC | PRN
  Filled 2013-12-30: qty 4

## 2013-12-30 MED ORDER — MORPHINE SULFATE 2 MG/ML IJ SOLN: 2.0000 mg | INTRAMUSCULAR | Status: DC | PRN

## 2013-12-30 MED ORDER — POTASSIUM CHLORIDE CRYS ER 20 MEQ PO TBCR: 20.0000 meq | EXTENDED_RELEASE_TABLET | Freq: Once | ORAL | Status: DC

## 2013-12-30 MED ORDER — PHENYLEPHRINE 40 MCG/ML (10ML) SYRINGE FOR IV PUSH (FOR BLOOD PRESSURE SUPPORT)
PREFILLED_SYRINGE | INTRAVENOUS | Status: AC
  Filled 2013-12-30: qty 10

## 2013-12-30 MED ORDER — ACETAMINOPHEN 650 MG RE SUPP: 325.0000 mg | RECTAL | Status: DC | PRN

## 2013-12-30 MED ORDER — SIMVASTATIN 5 MG PO TABS
5.0000 mg | ORAL_TABLET | Freq: Every day | ORAL | Status: DC
  Administered 2013-12-30: 5 mg via ORAL
  Filled 2013-12-30 (×2): qty 1

## 2013-12-30 MED ORDER — PHENYLEPHRINE HCL 10 MG/ML IJ SOLN
INTRAMUSCULAR | Status: DC | PRN
  Administered 2013-12-30 (×4): 80 ug via INTRAVENOUS

## 2013-12-30 MED ORDER — ALBUTEROL SULFATE (2.5 MG/3ML) 0.083% IN NEBU: 3.0000 mL | INHALATION_SOLUTION | RESPIRATORY_TRACT | Status: DC | PRN

## 2013-12-30 MED ORDER — ACETAMINOPHEN 325 MG PO TABS: 325.0000 mg | ORAL_TABLET | ORAL | Status: DC | PRN

## 2013-12-30 MED ORDER — PANTOPRAZOLE SODIUM 40 MG PO TBEC
40.0000 mg | DELAYED_RELEASE_TABLET | Freq: Every day | ORAL | Status: DC
  Administered 2013-12-31: 40 mg via ORAL
  Filled 2013-12-30: qty 1

## 2013-12-30 MED ORDER — SODIUM CHLORIDE 0.9 % IJ SOLN
3.0000 mL | Freq: Two times a day (BID) | INTRAMUSCULAR | Status: DC
  Administered 2013-12-31: 3 mL via INTRAVENOUS

## 2013-12-30 MED ORDER — LEVOTHYROXINE SODIUM 112 MCG PO TABS
168.0000 ug | ORAL_TABLET | Freq: Every day | ORAL | Status: DC
  Administered 2013-12-31: 168 ug via ORAL
  Filled 2013-12-30 (×3): qty 1.5

## 2013-12-30 SURGICAL SUPPLY — 39 items
ADH SKN CLS APL DERMABOND .7 (GAUZE/BANDAGES/DRESSINGS) ×1
CANISTER SUCTION 2500CC (MISCELLANEOUS) ×3 IMPLANT
CLIP TI MEDIUM 6 (CLIP) ×3 IMPLANT
CLIP TI WIDE RED SMALL 6 (CLIP) ×3 IMPLANT
COVER SURGICAL LIGHT HANDLE (MISCELLANEOUS) ×3 IMPLANT
DERMABOND ADVANCED (GAUZE/BANDAGES/DRESSINGS) ×2
DERMABOND ADVANCED .7 DNX12 (GAUZE/BANDAGES/DRESSINGS) ×1 IMPLANT
DRAPE INCISE IOBAN 66X45 STRL (DRAPES) ×6 IMPLANT
ELECT REM PT RETURN 9FT ADLT (ELECTROSURGICAL) ×3
ELECTRODE REM PT RTRN 9FT ADLT (ELECTROSURGICAL) ×1 IMPLANT
GLOVE BIO SURGEON STRL SZ7 (GLOVE) ×3 IMPLANT
GLOVE BIOGEL PI IND STRL 6.5 (GLOVE) IMPLANT
GLOVE BIOGEL PI IND STRL 7.5 (GLOVE) ×1 IMPLANT
GLOVE BIOGEL PI INDICATOR 6.5 (GLOVE) ×4
GLOVE BIOGEL PI INDICATOR 7.5 (GLOVE) ×4
GLOVE ECLIPSE 6.5 STRL STRAW (GLOVE) ×4 IMPLANT
GLOVE SS BIOGEL STRL SZ 7 (GLOVE) IMPLANT
GLOVE SUPERSENSE BIOGEL SZ 7 (GLOVE) ×2
GOWN STRL REUS W/ TWL LRG LVL3 (GOWN DISPOSABLE) ×3 IMPLANT
GOWN STRL REUS W/TWL LRG LVL3 (GOWN DISPOSABLE) ×9
GRAFT PROPATEN THIN WALL 6X80 (Vascular Products) ×2 IMPLANT
KIT BASIN OR (CUSTOM PROCEDURE TRAY) ×3 IMPLANT
KIT ROOM TURNOVER OR (KITS) ×3 IMPLANT
NS IRRIG 1000ML POUR BTL (IV SOLUTION) ×3 IMPLANT
PACK CV ACCESS (CUSTOM PROCEDURE TRAY) ×3 IMPLANT
PAD ARMBOARD 7.5X6 YLW CONV (MISCELLANEOUS) ×6 IMPLANT
SPONGE SURGIFOAM ABS GEL 100 (HEMOSTASIS) ×2 IMPLANT
SUT GORETEX 5 0 TT13 24 (SUTURE) ×6 IMPLANT
SUT GORETEX 6.0 TT13 (SUTURE) ×2 IMPLANT
SUT GORETEX 6.0 TT9 (SUTURE) ×4 IMPLANT
SUT MNCRL AB 4-0 PS2 18 (SUTURE) ×3 IMPLANT
SUT PROLENE 6 0 C 1 24 (SUTURE) ×4 IMPLANT
SUT VIC AB 2-0 CT1 27 (SUTURE) ×3
SUT VIC AB 2-0 CT1 TAPERPNT 27 (SUTURE) ×1 IMPLANT
SUT VIC AB 3-0 SH 27 (SUTURE) ×6
SUT VIC AB 3-0 SH 27X BRD (SUTURE) ×2 IMPLANT
TOWEL OR 17X24 6PK STRL BLUE (TOWEL DISPOSABLE) ×3 IMPLANT
TOWEL OR 17X26 10 PK STRL BLUE (TOWEL DISPOSABLE) ×3 IMPLANT
WATER STERILE IRR 1000ML POUR (IV SOLUTION) ×3 IMPLANT

## 2013-12-30 NOTE — Telephone Encounter (Addendum)
Message copied by Fredrich Birks on Tue Dec 30, 2013 12:07 PM ------      Message from: Sharee Pimple      Created: Tue Dec 30, 2013  9:50 AM      Regarding: Schedule                   ----- Message -----         From: Fransisco Hertz, MD         Sent: 12/30/2013   9:38 AM           To: [address]            KAMARI BUCH      [id number]      01-24-1956            PROCEDURE:  right thigh arteriovenous graft            Asst: Lianne Cure, [facility]             Follow-up: 4 weeks ------  12/30/13: spoke with Gala Romney, pts caregiver to schedule, dpm

## 2013-12-30 NOTE — Transfer of Care (Signed)
Immediate Anesthesia Transfer of Care Note   Patient: Marcus [name]  Procedure(s) Performed: Procedure(s): INSERTION OF ARTERIOVENOUS (AV) GORE-TEX GRAFT THIGH (Right)  Patient Location: PACU  Anesthesia Type:General  Level of Consciousness: awake and pateint uncooperative  Airway & Oxygen Therapy: Patient Spontanous Breathing and Patient connected to face mask oxygen  Post-op Assessment: Report given to PACU RN, Post -op Vital signs reviewed and stable and Patient moving all extremities  Post vital signs: Reviewed and stable  Complications: No apparent anesthesia complications

## 2013-12-30 NOTE — Anesthesia Postprocedure Evaluation (Signed)
  Anesthesia Post-op Note  Patient: Marcus [name]  Procedure(s) Performed: Procedure(s): INSERTION OF ARTERIOVENOUS (AV) GORE-TEX GRAFT THIGH (Right)  Patient Location: PACU  Anesthesia Type:General  Level of Consciousness: awake, oriented and patient cooperative  Airway and Oxygen Therapy: Patient Spontanous Breathing and Patient connected to nasal cannula oxygen  Post-op Pain: mild  Post-op Assessment: Post-op Vital signs reviewed, Patient's Cardiovascular Status Stable, Respiratory Function Stable and No signs of Nausea or vomiting  Post-op Vital Signs: Reviewed and stable  Last Vitals:  Filed Vitals:   12/30/13 1015  BP: 154/79  Pulse: 85  Temp:   Resp:     Complications: No apparent anesthesia complications

## 2013-12-30 NOTE — Anesthesia Preprocedure Evaluation (Addendum)
Anesthesia Evaluation  Patient identified by MRN, date of birth, ID band Patient awake    Reviewed: Allergy & Precautions, H&P , NPO status   Airway Mallampati: II TM Distance: >3 FB     Dental  (+) Poor Dentition, Dental Advisory Given,    Pulmonary COPDCurrent Smoker,  breath sounds clear to auscultation        Cardiovascular hypertension, Pt. on medications Rhythm:Regular Rate:Normal  EF 2010 55%   Neuro/Psych Anxiety Depression    GI/Hepatic GERD-  Medicated,  Endo/Other  diabetes, Insulin Dependent  Renal/GU      Musculoskeletal   Abdominal (+)  Abdomen: soft.    Peds  Hematology   Anesthesia Other Findings   Reproductive/Obstetrics                          Anesthesia Physical Anesthesia Plan  ASA: III  Anesthesia Plan: General   Post-op Pain Management:    Induction: Intravenous  Airway Management Planned: LMA and Oral ETT  Additional Equipment:   Intra-op Plan:   Post-operative Plan: Extubation in OR  Informed Consent: I have reviewed the patients History and Physical, chart, labs and discussed the procedure including the risks, benefits and alternatives for the proposed anesthesia with the patient or authorized representative who has indicated his/her understanding and acceptance.     Plan Discussed with:   Anesthesia Plan Comments:         Anesthesia Quick Evaluation

## 2013-12-30 NOTE — Anesthesia Procedure Notes (Signed)
Procedure Name: Intubation Date/Time: 12/30/2013 7:36 AM Performed by: Jerilee Hoh Pre-anesthesia Checklist: Patient identified, Emergency Drugs available, Suction available and Patient being monitored Patient Re-evaluated:Patient Re-evaluated prior to inductionOxygen Delivery Method: Circle system utilized Preoxygenation: Pre-oxygenation with 100% oxygen Intubation Type: IV induction Ventilation: Mask ventilation without difficulty Laryngoscope Size: Mac and 4 Grade View: Grade I Tube type: Oral Tube size: 7.5 mm Number of attempts: 1 Airway Equipment and Method: Stylet Placement Confirmation: ETT inserted through vocal cords under direct vision,  positive ETCO2 and breath sounds checked- equal and bilateral Secured at: 22 cm Tube secured with: Tape Dental Injury: Teeth and Oropharynx as per pre-operative assessment  Comments: Attempted to place #5 LMA unsuccessfully. Unable to obtain adequate seal. DL x 1, AOI.

## 2013-12-30 NOTE — Progress Notes (Signed)
INITIAL NUTRITION ASSESSMENT  DOCUMENTATION CODES Per approved criteria  -Severe malnutrition in the context of chronic illness -Underweight   INTERVENTION: Nepro Shake po BID, each supplement provides 425 kcal and 19 grams protein RD to follow for nutrition care plan  NUTRITION DIAGNOSIS: Increased nutrient needs related to ESRD on HD as evidenced by estimated nutrition needs  Goal: Pt to meet >/= 90% of their estimated nutrition needs   Monitor:  PO & supplemental intake, weight, labs, I/O's  Reason for Assessment: BMI < 18.5   58 y.o. male  Admitting Dx: ESRD  ASSESSMENT: 58 y.o. Male with PMHx significant for CVA, GIB, ESRD (on HD), DM, CHF; presented for placement of R thigh AVG.  Patient s/p procedure 9/15: RIGHT THIGH ARTERIOVENOUS GRAFT   Patient known to Clinical Nutrition during previous hospitalizations; severe malnutrition dx ongoing; no % PO intake available per flowsheet records; pt with increased nutrient needs given ESRD, malnutrition; has received Nepro Shakes and/or Glucerna Shakes during his previous hospital stays; RD to order at this time.  Nutrition Focused Physical Exam:   Subcutaneous Fat:  Orbital Region: WNL  Upper Arm Region: moderate depletion  Thoracic and Lumbar Region: moderate depletion   Muscle:  Temple Region: WNL  Clavicle Bone Region: WNL  Clavicle and Acromion Bone Region: N/A Scapular Bone Region: N/A Dorsal Hand: WNL  Patellar Region: severe depletion  Anterior Thigh Region: severe depletion  Posterior Calf Region: severe delpletion   Edema: none  Patient continues to meet criteria for severe malnutrition in the context of chronic illness as evidenced by severe muscle loss and severe subcutaneous fat loss.  Height: Ht Readings from Last 1 Encounters:  12/30/13  (1.854 m)    Weight: Wt Readings from Last 1 Encounters:  12/30/13 129 lb (58.514 kg)    Ideal Body Weight: 184 lb  % Ideal Body Weight: 70%  Wt  Readings from Last 10 Encounters:  12/30/13 129 lb (58.514 kg)  12/30/13 129 lb (58.514 kg)  11/18/13 129 lb 8 oz (58.741 kg)  07/15/13 133 lb 13.1 oz (60.7 kg)  05/28/13 124 lb 5.4 oz (56.4 kg)  05/12/13 135 lb (61.236 kg)  04/29/13 137 lb 9.6 oz (62.415 kg)  04/21/13 132 lb 7.9 oz (60.099 kg)  04/21/13 132 lb 7.9 oz (60.099 kg)  03/15/13 137 lb 12.6 oz (62.5 kg)    Usual Body Weight: 135-129 lb  % Usual Body Weight: 95-100%  BMI:  Body mass index is 17.02 kg/(m^2).  Estimated Nutritional Needs: Kcal: 1800-2000 Protein: 80-90 gm Fluid: 1200 ml  Skin: Intact  Diet Order: Renal w/1200 ml fluid restriction   EDUCATION NEEDS: -No education needs identified at this time   Intake/Output Summary (Last 24 hours) at 12/30/13 1212 Last data filed at 12/30/13 0932  Gross per 24 hour  Intake    500 ml  Output      0 ml  Net    500 ml   Labs:   Recent Labs Lab 12/30/13 0719  NA 135*  K 3.5*  GLUCOSE 175*    CBG (last 3)   Recent Labs  12/30/13 0607 12/30/13 0956 12/30/13 1132  GLUCAP 249* 125* 102*    Scheduled Meds: . amLODipine  10 mg Oral QHS  . calcium acetate  667 mg Oral TID WC  . cloNIDine  0.1 mg Oral TID  . docusate sodium  100 mg Oral BID  . enoxaparin (LOVENOX) injection  60 mg Subcutaneous Q24H  . [START ON 12/31/2013] FLUoxetine  5 mg Oral q morning - 10a  . insulin aspart  0-5 Units Subcutaneous TID AC  . insulin glargine  8 Units Subcutaneous BID  . [START ON 12/31/2013] levothyroxine  168 mcg Oral QAC breakfast  . metoprolol  75 mg Oral BID  . [START ON 12/31/2013] pantoprazole  40 mg Oral Daily  . potassium chloride  20-40 mEq Oral Once  . risperiDONE  0.5 mg Oral BID  . sevelamer carbonate  800 mg Oral TID WC  . simvastatin  5 mg Oral q1800  . sodium chloride  3 mL Intravenous Q12H  . [START ON 12/31/2013] temazepam  15 mg Oral Once per day on Mon Wed Fri Sat  . warfarin  7.5 mg Oral q1800  . Warfarin - Physician Dosing Inpatient   Does  not apply q1800    Continuous Infusions:   Past Medical History  Diagnosis Date  . Hypothyroidism   . Hyperlipidemia   . CVA (cerebrovascular accident)     hx CVA x 2  . GI bleed     2006, mallory-weiss tear  . Pulmonary edema     nov 2011, dry wt decreased  . Goiter     by CT Apr 2013  . Depression   . Cocaine abuse     past history  . Tobacco abuse     history of  . GERD (gastroesophageal reflux disease)   . Cataract   . Insomnia   . Chronic pain   . HTN (hypertension)     sees Dr. Ardelle Park, Rosalita Levan  . Neuromuscular disorder     diabetic neuropathy  . Occlusion of carotid stent     Per patients nursing aid  . Pneumonia     hx of  . Diabetes mellitus     Sees Dr. Reather Littler [phone] -- fasting blood 70-110s  . DKA (diabetic ketoacidosis)     severe, Dec 2011  . History of blood transfusion   . On home oxygen therapy     2l nasal cannula at night  . Hemodialysis patient     M, W, F  . CHF (congestive heart failure)   . DVT (deep venous thrombosis)   . ESRD (end stage renal disease) on dialysis     hd - [facility] [place]  . COPD (chronic obstructive pulmonary disease)     Past Surgical History  Procedure Laterality Date  . Av fistula placement  03/25/2008    right forearm  . Av fistula placement  04/23/2012    Procedure: INSERTION OF ARTERIOVENOUS (AV) GORE-TEX GRAFT THIGH;  Surgeon: Sherren Kerns, MD;  Location: [facility] OR;  Service: Vascular;  Laterality: Left;  . Arteriovenous graft placement Left     thigh  . Carotid stent insertion Right   . Dyalisis catheter    . Eye surgery Bilateral     cataracts  . Thyroid surgery  01/28/2013    Dr Gerrit Friends  . Lymph node biopsy Right 01/28/2013    Procedure: LYMPH NODE BIOPSY;  Surgeon: Velora Heckler, MD;  Location: [facility] OR;  Service: General;  Laterality: Right;  . Mass biopsy N/A 01/28/2013    Procedure: NECK EXPLORATION;  Surgeon: Velora Heckler, MD;  Location: [facility] OR;  Service: General;  Laterality: N/A;  .  Ligation arteriovenous gortex graft Right 02/27/2013    Procedure: LIGATION ARTERIOVENOUS GORTEX GRAFT;  Surgeon: Chuck Hint, MD;  Location: [facility] OR;  Service: Vascular;  Laterality: Right;  . Ligation arteriovenous gortex graft Left  03/03/2013    Procedure: LIGATION Left ARTERIOVENOUS GORTEX Thigh GRAFT;  Surgeon: Fransisco Hertz, MD;  Location: [facility] OR;  Service: Vascular;  Laterality: Left;  . Insertion of dialysis catheter Right 03/03/2013    Procedure: INSERTION OF DIALYSIS CATHETER;  Surgeon: Fransisco Hertz, MD;  Location: [facility] OR;  Service: Vascular;  Laterality: Right;  . Av fistula placement Left 03/14/2013    Procedure: INSERTION OF  ARTERIOVENOUS (AV) GORE-TEX GRAFT  LEFT THIGH;  Surgeon: Larina Earthly, MD;  Location: [facility] OR;  Service: Vascular;  Laterality: Left;  . Avgg removal Left 04/16/2013    Procedure: REMOVAL OF ARTERIOVENOUS GORETEX GRAFT Left Thigh;  Surgeon: Nada Libman, MD;  Location: [facility] OR;  Service: Vascular;  Laterality: Left;  . Patch angioplasty Left 04/16/2013    Procedure: Patch Angioplasty of left femoral artery;  Surgeon: Nada Libman, MD;  Location: [facility] OR;  Service: Vascular;  Laterality: Left;  . Colonoscopy  09/16/13  . Removal of a dialysis catheter Right 09/18/2013    Procedure: REMOVAL OF A DIALYSIS CATHETER-RIGHT FEMORAL VEIN;  Surgeon: Pryor Ochoa, MD;  Location: [facility] OR;  Service: Vascular;  Laterality: Right;  . Insertion of dialysis catheter Left 09/18/2013    Procedure: INSERTION OF DIALYSIS CATHETER-LEFT NECK VS LEFT THIGH;  Surgeon: Pryor Ochoa, MD;  Location: [facility] OR;  Service: Vascular;  Laterality: Left;    Maureen Chatters, RD, LDN Pager #: [phone] After-Hours Pager #: [phone]

## 2013-12-30 NOTE — Progress Notes (Signed)
Pt received into 2w33, pt responsive to verbal que's but otherwise eyes closed and moaning, tele placed on pt, vitals taken, pt stated no pain, will continue to monitor closely Archie Balboa, RN

## 2013-12-30 NOTE — Op Note (Signed)
OPERATIVE NOTE   PROCEDURE:  right thigh arteriovenous graft  PRE-OPERATIVE DIAGNOSIS: end stage renal disease   POST-OPERATIVE DIAGNOSIS: same as above   SURGEON: Leonides Sake, MD  ASSISTANT(S): Lianne Cure, PAC   ANESTHESIA: general  ESTIMATED BLOOD LOSS: 100 cc  FINDING(S): 1. Somewhat sclerotic common femoral vein  SPECIMEN(S):  none  INDICATIONS:   COAL NEARHOOD is a 58 y.o. male who presents with end stage renal disease.  His last remaining access option was a right thigh arteriovenous graft, where I previous tunneled dialysis catheter had been placed.  Risk, benefits, and alternatives to thigh arteriovenous graft placement were discussed.  The patient is aware the risks include but are not limited to: bleeding, infection, steal syndrome, nerve damage, ischemic monomelic neuropathy, failure to mature, possible development of critical limb ischemia due to steal syndrome and need for additional procedures.  The patient is aware that steal in the legs presents as possible gangrene.  The patient is aware of the risks and elects to proceed forward.  DESCRIPTION: After full informed written consent was obtained from the patient, the patient was brought back to the operating room and placed supine upon the operating table.  The patient was given IV antibiotics prior to proceeding.  After obtaining adequate sedation, the patient was prepped and draped in standard fashion for a right thigh arteriovenous graft placement.  I made an oblique incision over the common femoral artery and dissected down through the subcutaneous tissue until I had access to the common femoral artery and common femoral vein.  The common femoral artery appeared to be 5 mm externally and the common femoral vein appeared to be 5 mm externally.  I obtained a 6 mm goretex graft and stretched it to full length.  I then determined where the apex of the loop of the graft would be located.  I then placed transverse  incisions on the lateral and medial thigh and dissected a small pocket in the subcutaneous tissue at both locations.   Then using a Gore tunneler I tunneled from the groin to the lateral thigh incision and delivered the graft.  I then tunneled from lateral thigh incision to medial thigh incision and delivered the graft.  Finally, I tunneled from medial thigh incision to groin and finished delivering the graft.  The orientation of the graft was maintained throughout this process.  I then gave the patient 5000 units of heparin to gain some limited anticoagulation.  After waiting 3 minutes, I then clamped the common femoral vein proximally and distally and made an venotomy and extended it with a Potts scissor.  The anterior vein surface was hypertrophied and somewhat scarred.  I then spatulate the graft to meet the dimensions of the venotomy and sewed the graft to the vein in an end-to-side configuration with running stitch of CV-6 suture.  I clamped the graft near its venous anastomosis and released the clamps on the common femoral vein.  I then pulled the graft to appropriate tension throughout the tunnel.  I then clamped the common femoral artery proximally and distally and made an arteriotomy , which I extended with a Pott's scissor.  The graft was then spatulated to meet the dimensions of the arteriotomy The graft was sewn to the vein in an end-to-side configuration with a running stitch of CV-6   Prior to completion this anastomosis, I released all clamps on the graft and vein, allowing the graft and vein to backbleed before completing the anastomosis.  There was some venous back bleeding from the graft.  There was excellent backbleeding from the artery.  The anastomosis was completed in the usual fashion.  I placed thrombin and Gelfoam in the wound.  After waiting, a few minutes, I washed out the wound and there was no further active bleeding.  The common femoral artery was examined which demonstrated: strong  pulse.  There was a palpable thrill in the venous outflow.  The groin was repaired with a double layer of 3-0 Vicryl and a running subcuticular of 4-0 Monocryl.  Both the medial and lateral incision were repaired with a layer of 3-0 Vicryl in the subcutaneous tissue and a running subcuticular of 4-0 Monocryl.  All incisions were cleaned, dried, and reinforced with Dermabond .    COMPLICATIONS: none  CONDITION: stable  Leonides Sake, MD 12/30/2013 9:31 AM

## 2013-12-30 NOTE — Progress Notes (Addendum)
  Day of Surgery Note    Subjective:  Sleepy, but wakes very easily to voice.  Wants to eat.  Filed Vitals:   12/30/13 1325  BP: 130/67  Pulse: 86  Temp: 98.3 F (36.8 C)  Resp: 20    Incisions:   C/d/i  Extremities:  Right foot is warm with 2+ palpable right DP; + bruit within graft Lungs:  Non labored   Assessment/Plan:  This is a 58 y.o. male who is s/p right thigh graft  -pt much more alert this afternoon.  He has not had any episodes of desaturations. -palpable pulse right DP and audible bruit within graft. -probable d/c in am   Doreatha Massed, PA-C 12/30/2013 3:23 PM  Addendum  I have independently interviewed and examined the patient, and I agree with the physician assistant's findings.  Palpable DP in R foot.  No hematoma in right groin or exposure sites.  D/C this AM.  HD as outpt.   Leonides Sake, MD [facility] Office: [phone] Pager: [phone]  12/31/2013, 8:14 AM

## 2013-12-30 NOTE — Significant Event (Addendum)
Rapid Response Event Note  Overview:  Called to assist with patient with decreased LOC post-op fem av graft insertion Time Called: 1128 Arrival Time: 1131 Event Type: Neurologic  Initial Focused Assessment:  On arrival patient supine in bed - warm and dry - will arouse to deep stimulus and loud voice - quickly nods back off - answers questions but not accurate - ESRD patient post-op - spoke with staff from PACU - no meds given there - had 150 mcg  Fentanyl in OR around 0815 per PACU staff .  BIl BS present with rhonchi - BP 150/72 HR  85 and regular - RR 6-12 depending on stimulation - O2 sats 100% on 2 liter nasal cannula.  Right thigh incision clean and intact.  Right graft pulse and thrill patent.     Interventions:  Ambulance person spoke with PA Marisue Humble.  Narcan 0.4 mg IV given per order.  Patient becoming more easily aroused - no startle affect with Narcan.  Can answer questions - seems oriented - resps more even and deep.  BIl BS with Rhonchi -will cough to command - fair cough but non-productive.  Placed on continous O2 sat monitor - heart monitor on.   Has previous stroke with left hemiparesis per report.  Will follow commands after Narcan.  After 25 mins becoming sleepy again - some periods of shallow resps - arouses to name but quickly back to sleep.  Maureen PA notified - ABG requested and drawn.  1225 - Now with some periods of apnea again - maintaining O2 sats but with loud snoring - able to respond to voice - can stick out tongue - Narcan 0.4 mg IV given.  ABG results and update with second Narcan called to [place] PA bu Zollie Scale RN.  Patient remains arousable to voice but with periods of shallow resps and snoring followed by deep resps.  No desaturations.  BP stable.  VSS.    Maureen PA here - no changes.   Will watch.      Event Summary: Name of Physician Notified: Dr. Imogene Burn  at 1128    at    Outcome: Stayed in room and stabalized  Event End Time: 1245  [place]

## 2013-12-30 NOTE — Progress Notes (Signed)
Upon assessment pt breathing was shallow and very lethargic, pt will awaken to verbal stimuli and answer name and location but quickly goes back to sleep, Rapid response paged and Maureen PA, rapid at bedside to assess, pt given narcan, pt became a little more alert, ABG taken, Narcan given again, Maureen PA at bedside to assess, pt on continuous pulse ox, tele, and 2L , will continue to monitor closely Archie Balboa, RN

## 2013-12-30 NOTE — H&P (Signed)
Brief History and Physical  History of Present Illness  Marcus [name] is a 58 y.o. male who presents with chief complaint: ESRD.  The patient presents today for placement of R thigh AVG.    Pt was evaluated by Dr. Hart Rochester and found to be safe for such.  Past Medical History  Diagnosis Date  . Hypothyroidism   . Hyperlipidemia   . CVA (cerebrovascular accident)     hx CVA x 2  . GI bleed     2006, mallory-weiss tear  . Pulmonary edema     nov 2011, dry wt decreased  . Goiter     by CT Apr 2013  . Depression   . Cocaine abuse     past history  . Tobacco abuse     history of  . GERD (gastroesophageal reflux disease)   . Cataract   . Insomnia   . Chronic pain   . HTN (hypertension)     sees Dr. Ardelle Park, Rosalita Levan  . Neuromuscular disorder     diabetic neuropathy  . Occlusion of carotid stent     Per patients nursing aid  . Pneumonia     hx of  . Diabetes mellitus     Sees Dr. Reather Littler [phone] -- fasting blood 70-110s  . DKA (diabetic ketoacidosis)     severe, Dec 2011  . History of blood transfusion   . On home oxygen therapy     2l nasal cannula at night  . Hemodialysis patient     M, W, F  . CHF (congestive heart failure)   . DVT (deep venous thrombosis)   . ESRD (end stage renal disease) on dialysis     hd - [facility] - [place]  . COPD (chronic obstructive pulmonary disease)     Past Surgical History  Procedure Laterality Date  . Av fistula placement  03/25/2008    right forearm  . Av fistula placement  04/23/2012    Procedure: INSERTION OF ARTERIOVENOUS (AV) GORE-TEX GRAFT THIGH;  Surgeon: Sherren Kerns, MD;  Location: [facility] OR;  Service: Vascular;  Laterality: Left;  . Arteriovenous graft placement Left     thigh  . Carotid stent insertion Right   . Dyalisis catheter    . Eye surgery Bilateral     cataracts  . Thyroid surgery  01/28/2013    Dr Gerrit Friends  . Lymph node biopsy Right 01/28/2013    Procedure: LYMPH NODE BIOPSY;  Surgeon: Velora Heckler, MD;  Location: [facility] OR;  Service: General;  Laterality: Right;  . Mass biopsy N/A 01/28/2013    Procedure: NECK EXPLORATION;  Surgeon: Velora Heckler, MD;  Location: [facility] OR;  Service: General;  Laterality: N/A;  . Ligation arteriovenous gortex graft Right 02/27/2013    Procedure: LIGATION ARTERIOVENOUS GORTEX GRAFT;  Surgeon: Chuck Hint, MD;  Location: [facility] OR;  Service: Vascular;  Laterality: Right;  . Ligation arteriovenous gortex graft Left 03/03/2013    Procedure: LIGATION Left ARTERIOVENOUS GORTEX Thigh GRAFT;  Surgeon: Fransisco Hertz, MD;  Location: [facility] OR;  Service: Vascular;  Laterality: Left;  . Insertion of dialysis catheter Right 03/03/2013    Procedure: INSERTION OF DIALYSIS CATHETER;  Surgeon: Fransisco Hertz, MD;  Location: [facility] OR;  Service: Vascular;  Laterality: Right;  . Av fistula placement Left 03/14/2013    Procedure: INSERTION OF  ARTERIOVENOUS (AV) GORE-TEX GRAFT  LEFT THIGH;  Surgeon: Larina Earthly, MD;  Location: [facility] OR;  Service: Vascular;  Laterality: Left;  . Avgg removal Left 04/16/2013    Procedure: REMOVAL OF ARTERIOVENOUS GORETEX GRAFT Left Thigh;  Surgeon: Nada Libman, MD;  Location: [facility] OR;  Service: Vascular;  Laterality: Left;  . Patch angioplasty Left 04/16/2013    Procedure: Patch Angioplasty of left femoral artery;  Surgeon: Nada Libman, MD;  Location: [facility] OR;  Service: Vascular;  Laterality: Left;  . Colonoscopy  09/16/13  . Removal of a dialysis catheter Right 09/18/2013    Procedure: REMOVAL OF A DIALYSIS CATHETER-RIGHT FEMORAL VEIN;  Surgeon: Pryor Ochoa, MD;  Location: [facility] OR;  Service: Vascular;  Laterality: Right;  . Insertion of dialysis catheter Left 09/18/2013    Procedure: INSERTION OF DIALYSIS CATHETER-LEFT NECK VS LEFT THIGH;  Surgeon: Pryor Ochoa, MD;  Location: [facility] OR;  Service: Vascular;  Laterality: Left;    History   Social History  . Marital Status: Married    Spouse Name: N/A    Number of Children: N/A  . Years of  Education: N/A   Occupational History  . Not on file.   Social History Main Topics  . Smoking status: Current Every Day Smoker -- 0.25 packs/day    Types: Cigarettes  . Smokeless tobacco: Never Used     Comment: pt states that he has been thinking about giving it up  . Alcohol Use: No  . Drug Use: No     Comment: in the past   . Sexual Activity: Not on file   Other Topics Concern  . Not on file   Social History Narrative  . No narrative on file    Family History  Problem Relation Age of Onset  . Diabetes Sister   . Diabetes Brother   . Hypertension Brother   . Heart disease Brother   . Hypertension Mother   . Heart attack Mother   . Hyperlipidemia Mother   . Heart disease Mother     before age 25  . Hypertension Father     Current Facility-Administered Medications on File Prior to Encounter  Medication Dose Route Frequency Provider Last Rate Last Dose  . vancomycin (VANCOCIN) IVPB 1000 mg/200 mL premix  1,000 mg Intravenous 60 min Pre-Op Nada Libman, MD       Current Outpatient Prescriptions on File Prior to Encounter  Medication Sig Dispense Refill  . amLODipine (NORVASC) 10 MG tablet Take 1 tablet (10 mg total) by mouth at bedtime.  30 tablet  2  . calcium acetate (PHOSLO) 667 MG capsule Take 667 mg by mouth 3 (three) times daily with meals.       . cloNIDine (CATAPRES) 0.1 MG tablet Take 1 tablet (0.1 mg total) by mouth 3 (three) times daily.  60 tablet  11  . docusate sodium (COLACE) 100 MG capsule Take 100 mg by mouth 2 (two) times daily.       Marland Kitchen FLUoxetine (PROZAC) 10 MG tablet Take 0.5 tablets (5 mg total) by mouth every morning.  30 tablet  3  . insulin aspart (NOVOLOG) 100 UNIT/ML injection Inject 0-5 Units into the skin 3 (three) times daily before meals.      . insulin glargine (LANTUS) 100 UNIT/ML injection Inject 0.08 mLs (8 Units total) into the skin 2 (two) times daily.  10 mL  11  . levothyroxine (SYNTHROID, LEVOTHROID) 112 MCG tablet Take 1.5  tablets (168 mcg total) by mouth daily before breakfast.  30 tablet  1  . metoprolol (LOPRESSOR) 50 MG tablet Take 1.5  tablets (75 mg total) by mouth 2 (two) times daily. Hold morning dose on dialysis days  60 tablet  2  . omeprazole (PRILOSEC) 20 MG capsule Take 20 mg by mouth daily.      . pravastatin (PRAVACHOL) 10 MG tablet Take 10 mg by mouth at bedtime.      Marland Kitchen PROAIR HFA 108 (90 BASE) MCG/ACT inhaler Inhale 2 puffs into the lungs every 4 (four) hours as needed for wheezing or shortness of breath.       . sevelamer carbonate (RENVELA) 800 MG tablet Take 800 mg by mouth 3 (three) times daily with meals.       . temazepam (RESTORIL) 15 MG capsule Take 15 mg by mouth See admin instructions. Take on Monday, Wednesday, Friday, and Saturday night      . warfarin (COUMADIN) 7.5 MG tablet Take 1 tablet (7.5 mg total) by mouth daily.  30 tablet  2  . enoxaparin (LOVENOX) 60 MG/0.6ML injection Inject 60 mg into the skin every 12 (twelve) hours.      . metoCLOPramide (REGLAN) 5 MG tablet Take 1 tablet (5 mg total) by mouth every 6 (six) hours as needed for nausea.  30 tablet  0  . oxyCODONE-acetaminophen (PERCOCET/ROXICET) 5-325 MG per tablet Take 1 tablet by mouth every 6 (six) hours as needed.  30 tablet  0  . risperiDONE (RISPERDAL) 0.5 MG tablet Take 0.5 mg by mouth 2 (two) times daily as needed.        Allergies  Allergen Reactions  . Penicillins Other (See Comments)    Unknown reaction    Review of Systems: As listed above, otherwise negative.  Physical Examination  Filed Vitals:   12/30/13 0602  BP: 180/106  Pulse: 70  Temp: 98.8 F (37.1 C)  TempSrc: Oral  Resp: 16  Height:  (1.854 m)  Weight: 129 lb (58.514 kg)  SpO2: 100%    General: A&O x 3, WDWN  Pulmonary: Sym exp, good air movt, CTAB, no rales, rhonchi, & wheezing  Cardiac: RRR, Nl S1, S2, no Murmurs, rubs or gallops  Gastrointestinal: soft, NTND, -G/R, - HSM, - masses, - CVAT B  Musculoskeletal: M/S 5/5  throughout , Extremities without ischemic changes , thombosed L thigh AVG  Laboratory See iStat  Medical Decision Making  Marcus [name] is a 58 y.o. male who presents with: ESRD.   The patient is scheduled for: R thigh AVG  Risk, benefits, and alternatives to access surgery were discussed.  The patient is aware the risks include but are not limited to: bleeding, infection, steal syndrome, nerve damage, ischemic monomelic neuropathy, failure to mature, and need for additional procedures.  The patient is aware that steal in a lower extremity can result in gangrene.  The patient is aware of the risks and agrees to proceed.  Leonides Sake, MD [facility] [place] Office: [phone] Pager: [phone]  12/30/2013, 7:00 AM

## 2013-12-31 ENCOUNTER — Encounter (HOSPITAL_COMMUNITY): Payer: Self-pay | Admitting: General Practice

## 2013-12-31 DIAGNOSIS — I120 Hypertensive chronic kidney disease with stage 5 chronic kidney disease or end stage renal disease: Secondary | ICD-10-CM | POA: Diagnosis not present

## 2013-12-31 LAB — CBC
HEMATOCRIT: 24.4 % — AB (ref 39.0–52.0)
Hemoglobin: 7.8 g/dL — ABNORMAL LOW (ref 13.0–17.0)
MCH: 28.8 pg (ref 26.0–34.0)
MCHC: 32 g/dL (ref 30.0–36.0)
MCV: 90 fL (ref 78.0–100.0)
Platelets: 216 10*3/uL (ref 150–400)
RBC: 2.71 MIL/uL — AB (ref 4.22–5.81)
RDW: 16.6 % — AB (ref 11.5–15.5)
WBC: 5.6 10*3/uL (ref 4.0–10.5)

## 2013-12-31 LAB — PROTIME-INR
INR: 1.95 — ABNORMAL HIGH (ref 0.00–1.49)
Prothrombin Time: 22.2 seconds — ABNORMAL HIGH (ref 11.6–15.2)

## 2013-12-31 LAB — GLUCOSE, CAPILLARY
Glucose-Capillary: 149 mg/dL — ABNORMAL HIGH (ref 70–99)
Glucose-Capillary: 220 mg/dL — ABNORMAL HIGH (ref 70–99)

## 2013-12-31 MED ORDER — INFLUENZA VAC SPLIT QUAD 0.5 ML IM SUSY: 0.5000 mL | PREFILLED_SYRINGE | INTRAMUSCULAR | Status: DC

## 2013-12-31 NOTE — Progress Notes (Signed)
Spoke to pts dialysis center in [place] and arranged for dialysis this afternoon upon discharge, family notified of discharge and need to go straight to dialysis Archie Balboa, RN

## 2013-12-31 NOTE — Progress Notes (Signed)
Discharge education, medication, prescriptions, follow-up appts, and incision care given to pt and caregiver, care giver stated understanding and that she had no questions, tele removed, pt dressed, told pt and caregiver that dialysis is expecting them this afternoon Archie Balboa, RN

## 2013-12-31 NOTE — Progress Notes (Signed)
     Subjective  - Doing much better this am.  Alert and oriented eating breakfast.   Objective 125/61 83 98.2 F (36.8 C) (Oral) 17 100%  Intake/Output Summary (Last 24 hours) at 12/31/13 0931 Last data filed at 12/31/13 0900  Gross per 24 hour  Intake    740 ml  Output      0 ml  Net    740 ml    Right thigh graft with thrill palpable Incisions are clean and dry.  Nursing stated there was drainage from the medial incision distally pinpoint bleeding that has now stopped.  Dry dressing if needed PRN. Right foot warm well perfused with Dp Pulses palpable. Lings non labored breathing SAT 100% RA   Assessment/Planning: POD #1 Right AV thigh graft Discharge home.  He will go to his out patient dialysis center today.  Nursing staff contacted family and center to confirm.   Clinton Gallant [facility] 12/31/2013 9:31 AM --  Laboratory Lab Results:  Recent Labs  12/30/13 0719 12/31/13 0302  WBC  --  5.6  HGB 9.5* 7.8*  HCT 28.0* 24.4*  PLT  --  216   BMET  Recent Labs  12/30/13 0719  NA 135*  K 3.5*  GLUCOSE 175*    COAG Lab Results  Component Value Date   INR 1.95* 12/31/2013   INR 1.93* 12/30/2013   INR 1.05 09/18/2013   No results found for this basename: PTT

## 2013-12-31 NOTE — Discharge Summary (Signed)
[facility] Discharge Summary   Patient ID:  Marcus [name] MRN: [id number] DOB/AGE: [birth year] 58 y.o.  Admit date: 12/30/2013 Discharge date: 12/31/2013 Date of Surgery: 12/30/2013 Surgeon: Surgeon(s): Fransisco Hertz, MD  Admission Diagnosis: End stage renal disease   Discharge Diagnoses:  End stage renal disease   Secondary Diagnoses: Past Medical History  Diagnosis Date  . Hypothyroidism   . Hyperlipidemia   . CVA (cerebrovascular accident)     hx CVA x 2  . GI bleed     2006, mallory-weiss tear  . Pulmonary edema     nov 2011, dry wt decreased  . Goiter     by CT Apr 2013  . Depression   . Cocaine abuse     past history  . Tobacco abuse     history of  . GERD (gastroesophageal reflux disease)   . Cataract   . Insomnia   . Chronic pain   . HTN (hypertension)     sees Dr. Ardelle Park, Rosalita Levan  . Neuromuscular disorder     diabetic neuropathy  . Occlusion of carotid stent     Per patients nursing aid  . Pneumonia     hx of  . Diabetes mellitus     Sees Dr. Reather Littler [phone] -- fasting blood 70-110s  . DKA (diabetic ketoacidosis)     severe, Dec 2011  . History of blood transfusion   . On home oxygen therapy     2l nasal cannula at night  . Hemodialysis patient     M, W, F  . CHF (congestive heart failure)   . DVT (deep venous thrombosis)   . ESRD (end stage renal disease) on dialysis     hd - [facility] - [place]  . COPD (chronic obstructive pulmonary disease)     Procedure(s): INSERTION OF ARTERIOVENOUS (AV) GORE-TEX GRAFT THIGH  Discharged Condition: good  HPI: Marcus [name] is a 58 y.o. male who presents with chief complaint: ESRD. The patient presents today for placement of R thigh AVG.    Hospital Course:  Marcus [name] is a 58 y.o. male is S/P Right Procedure(s): INSERTION OF ARTERIOVENOUS (AV) GORE-TEX GRAFT THIGH Post-op he was admitted for observation He was lethargic and not responsive to verbal cue.  He started  shallow breathing O2 was placed via [place], tele monitoring, and rapid response was called. ABG was ordered and reviewed by Rapid response nurse.  He was at base line, SAT was 100 % on 2L O2 [place].  He was given 2 doses of Narcan.  With in 1 hour patient was aggravated and responsive.  He was monitored over night.  No acute distress over night and this am.  Discharge home in stable condition.     Significant Diagnostic Studies: CBC Lab Results  Component Value Date   WBC 5.6 12/31/2013   HGB 7.8* 12/31/2013   HCT 24.4* 12/31/2013   MCV 90.0 12/31/2013   PLT 216 12/31/2013    BMET    Component Value Date/Time   NA 135* 12/30/2013 0719   K 3.5* 12/30/2013 0719   CL 83* 07/14/2013 1226   CO2 22 07/14/2013 1226   GLUCOSE 175* 12/30/2013 0719   BUN 60* 07/14/2013 1226   CREATININE 8.09* 07/14/2013 1226   CALCIUM 8.9 07/14/2013 1226   CALCIUM 8.8 05/20/2008 0705   GFRNONAA 7* 07/14/2013 1226   GFRAA 8* 07/14/2013 1226   COAG Lab Results  Component Value Date   INR 1.95* 12/31/2013  INR 1.93* 12/30/2013   INR 1.05 09/18/2013     Disposition:  Discharge to :Home Discharge Instructions   Call MD for:  redness, tenderness, or signs of infection (pain, swelling, bleeding, redness, odor or green/yellow discharge around incision site)    Complete by:  As directed      Call MD for:  severe or increased pain, loss or decreased feeling  in affected limb(s)    Complete by:  As directed      Call MD for:  temperature >100.5    Complete by:  As directed      Discharge instructions    Complete by:  As directed   Keep the right thigh clean and dry for 24 hours then you may wash the right leg.     Discharge patient    Complete by:  As directed   Discharge pt to home     Increase activity slowly    Complete by:  As directed   Walk with assistance use walker or cane as needed     Resume previous diet    Complete by:  As directed             Medication List         amLODipine 10 MG tablet  Commonly  known as:  NORVASC  Take 1 tablet (10 mg total) by mouth at bedtime.     calcium acetate 667 MG capsule  Commonly known as:  PHOSLO  Take 667 mg by mouth 3 (three) times daily with meals. Also takes with snacks     cloNIDine 0.1 MG tablet  Commonly known as:  CATAPRES  Take 0.1 mg by mouth 3 (three) times daily. Except on MWF, skip morning dose for dialysis.     docusate sodium 100 MG capsule  Commonly known as:  COLACE  Take 100 mg by mouth 2 (two) times daily.     FLUoxetine 10 MG tablet  Commonly known as:  PROZAC  Take 0.5 tablets (5 mg total) by mouth every morning.     insulin aspart 100 UNIT/ML injection  Commonly known as:  novoLOG  Inject 0-5 Units into the skin 3 (three) times daily before meals. According to sliding scale. Above 150 = 3 units. Above 250 = 4 units. Above 350 = 5 units.     insulin glargine 100 UNIT/ML injection  Commonly known as:  LANTUS  Inject 0.08 mLs (8 Units total) into the skin 2 (two) times daily.     levothyroxine 125 MCG tablet  Commonly known as:  SYNTHROID, LEVOTHROID  Take 250 mcg by mouth daily before breakfast.     metoprolol 50 MG tablet  Commonly known as:  LOPRESSOR  Take 1.5 tablets (75 mg total) by mouth 2 (two) times daily. Hold morning dose on dialysis days     multivitamin Tabs tablet  Take 1 tablet by mouth daily.     omeprazole 20 MG capsule  Commonly known as:  PRILOSEC  Take 20 mg by mouth daily.     oxyCODONE-acetaminophen 5-325 MG per tablet  Commonly known as:  PERCOCET/ROXICET  Take 1 tablet by mouth every 6 (six) hours as needed.     pravastatin 10 MG tablet  Commonly known as:  PRAVACHOL  Take 10 mg by mouth at bedtime.     PROAIR HFA 108 (90 BASE) MCG/ACT inhaler  Generic drug:  albuterol  Inhale 2 puffs into the lungs every 4 (four) hours as needed for wheezing or shortness  of breath.     promethazine 25 MG tablet  Commonly known as:  PHENERGAN  Take 25 mg by mouth every 6 (six) hours as needed for  nausea or vomiting.     sevelamer carbonate 800 MG tablet  Commonly known as:  RENVELA  Take 800 mg by mouth 3 (three) times daily with meals.     temazepam 15 MG capsule  Commonly known as:  RESTORIL  Take 15 mg by mouth at bedtime.     warfarin 6 MG tablet  Commonly known as:  COUMADIN  Take 9 mg by mouth daily.       Verbal and written Discharge instructions given to the patient. Wound care per Discharge AVS     Follow-up Information   Follow up with Nilda Simmer, MD. (As needed)    Specialty:  Vascular Surgery   Contact information:   [address] [place] [address] [phone]       Signed: Clinton Gallant [facility] 12/31/2013, 9:36 AM  Addendum  I have independently interviewed and examined the patient, and I agree with the physician assistant's discharge summary.  This patient underwent an unremarkable Right thigh arteriovenous graft.  He was admitted overnight per the patient's family's wishes.  He is stable for discharge home with palpable pulse in right foot.  He has no evidence of hematoma in any incision and all incision are intact.  He will follow up in the office in 4 weeks for re-evaluation.   Leonides Sake, MD [facility] [place] Office: [phone] Pager: [phone]  12/31/2013, 11:22 AM

## 2014-01-01 ENCOUNTER — Encounter (HOSPITAL_COMMUNITY): Payer: Self-pay | Admitting: Vascular Surgery

## 2014-01-01 NOTE — Progress Notes (Signed)
Late Note: Pt seen by Dr. Berneice Heinrich when ready for discharge from PACU on 12/30/2013  At 1025 . He stated pt was at preop baseline as seen in preop. Release from PACU

## 2014-01-29 ENCOUNTER — Encounter: Payer: Self-pay | Admitting: Vascular Surgery

## 2014-01-30 ENCOUNTER — Inpatient Hospital Stay (HOSPITAL_COMMUNITY)
Admission: AD | Admit: 2014-01-30 | Discharge: 2014-02-02 | DRG: 637 | Disposition: A | Discharge status: Skilled Nursing Facility | Payer: Medicare Other | Source: Other Acute Inpatient Hospital | Attending: Internal Medicine | Admitting: Internal Medicine

## 2014-01-30 ENCOUNTER — Encounter: Payer: Medicare Other | Admitting: Vascular Surgery

## 2014-01-30 ENCOUNTER — Encounter (HOSPITAL_COMMUNITY): Payer: Self-pay | Admitting: General Practice

## 2014-01-30 ENCOUNTER — Inpatient Hospital Stay (HOSPITAL_COMMUNITY): Payer: Medicare Other

## 2014-01-30 DIAGNOSIS — G8929 Other chronic pain: Secondary | ICD-10-CM | POA: Diagnosis present

## 2014-01-30 DIAGNOSIS — Z9981 Dependence on supplemental oxygen: Secondary | ICD-10-CM

## 2014-01-30 DIAGNOSIS — Z86718 Personal history of other venous thrombosis and embolism: Secondary | ICD-10-CM | POA: Diagnosis not present

## 2014-01-30 DIAGNOSIS — F17210 Nicotine dependence, cigarettes, uncomplicated: Secondary | ICD-10-CM | POA: Diagnosis present

## 2014-01-30 DIAGNOSIS — I120 Hypertensive chronic kidney disease with stage 5 chronic kidney disease or end stage renal disease: Secondary | ICD-10-CM | POA: Diagnosis present

## 2014-01-30 DIAGNOSIS — R627 Adult failure to thrive: Secondary | ICD-10-CM | POA: Diagnosis present

## 2014-01-30 DIAGNOSIS — Z8701 Personal history of pneumonia (recurrent): Secondary | ICD-10-CM

## 2014-01-30 DIAGNOSIS — K219 Gastro-esophageal reflux disease without esophagitis: Secondary | ICD-10-CM | POA: Diagnosis present

## 2014-01-30 DIAGNOSIS — Z95820 Peripheral vascular angioplasty status with implants and grafts: Secondary | ICD-10-CM

## 2014-01-30 DIAGNOSIS — E1010 Type 1 diabetes mellitus with ketoacidosis without coma: Secondary | ICD-10-CM | POA: Diagnosis present

## 2014-01-30 DIAGNOSIS — E049 Nontoxic goiter, unspecified: Secondary | ICD-10-CM | POA: Diagnosis present

## 2014-01-30 DIAGNOSIS — E039 Hypothyroidism, unspecified: Secondary | ICD-10-CM | POA: Diagnosis present

## 2014-01-30 DIAGNOSIS — Z992 Dependence on renal dialysis: Secondary | ICD-10-CM

## 2014-01-30 DIAGNOSIS — G709 Myoneural disorder, unspecified: Secondary | ICD-10-CM | POA: Diagnosis present

## 2014-01-30 DIAGNOSIS — H269 Unspecified cataract: Secondary | ICD-10-CM | POA: Diagnosis present

## 2014-01-30 DIAGNOSIS — Z9889 Other specified postprocedural states: Secondary | ICD-10-CM | POA: Diagnosis not present

## 2014-01-30 DIAGNOSIS — J690 Pneumonitis due to inhalation of food and vomit: Secondary | ICD-10-CM

## 2014-01-30 DIAGNOSIS — I10 Essential (primary) hypertension: Secondary | ICD-10-CM

## 2014-01-30 DIAGNOSIS — D631 Anemia in chronic kidney disease: Secondary | ICD-10-CM | POA: Diagnosis present

## 2014-01-30 DIAGNOSIS — E1029 Type 1 diabetes mellitus with other diabetic kidney complication: Secondary | ICD-10-CM | POA: Diagnosis present

## 2014-01-30 DIAGNOSIS — Z794 Long term (current) use of insulin: Secondary | ICD-10-CM | POA: Diagnosis not present

## 2014-01-30 DIAGNOSIS — I6529 Occlusion and stenosis of unspecified carotid artery: Secondary | ICD-10-CM | POA: Diagnosis present

## 2014-01-30 DIAGNOSIS — F329 Major depressive disorder, single episode, unspecified: Secondary | ICD-10-CM | POA: Diagnosis present

## 2014-01-30 DIAGNOSIS — N186 End stage renal disease: Secondary | ICD-10-CM | POA: Diagnosis present

## 2014-01-30 DIAGNOSIS — G4700 Insomnia, unspecified: Secondary | ICD-10-CM | POA: Diagnosis present

## 2014-01-30 DIAGNOSIS — Z880 Allergy status to penicillin: Secondary | ICD-10-CM | POA: Diagnosis not present

## 2014-01-30 DIAGNOSIS — Z79899 Other long term (current) drug therapy: Secondary | ICD-10-CM

## 2014-01-30 DIAGNOSIS — Z833 Family history of diabetes mellitus: Secondary | ICD-10-CM | POA: Diagnosis not present

## 2014-01-30 DIAGNOSIS — F1410 Cocaine abuse, uncomplicated: Secondary | ICD-10-CM | POA: Diagnosis present

## 2014-01-30 DIAGNOSIS — I509 Heart failure, unspecified: Secondary | ICD-10-CM | POA: Diagnosis present

## 2014-01-30 DIAGNOSIS — E785 Hyperlipidemia, unspecified: Secondary | ICD-10-CM | POA: Diagnosis present

## 2014-01-30 DIAGNOSIS — E43 Unspecified severe protein-calorie malnutrition: Secondary | ICD-10-CM | POA: Diagnosis present

## 2014-01-30 DIAGNOSIS — E1021 Type 1 diabetes mellitus with diabetic nephropathy: Secondary | ICD-10-CM

## 2014-01-30 DIAGNOSIS — R4182 Altered mental status, unspecified: Secondary | ICD-10-CM | POA: Diagnosis present

## 2014-01-30 DIAGNOSIS — Z8249 Family history of ischemic heart disease and other diseases of the circulatory system: Secondary | ICD-10-CM | POA: Diagnosis not present

## 2014-01-30 DIAGNOSIS — Z8673 Personal history of transient ischemic attack (TIA), and cerebral infarction without residual deficits: Secondary | ICD-10-CM | POA: Diagnosis not present

## 2014-01-30 DIAGNOSIS — T827XXD Infection and inflammatory reaction due to other cardiac and vascular devices, implants and grafts, subsequent encounter: Secondary | ICD-10-CM

## 2014-01-30 DIAGNOSIS — J449 Chronic obstructive pulmonary disease, unspecified: Secondary | ICD-10-CM | POA: Diagnosis present

## 2014-01-30 DIAGNOSIS — Z7901 Long term (current) use of anticoagulants: Secondary | ICD-10-CM | POA: Diagnosis not present

## 2014-01-30 LAB — GLUCOSE, CAPILLARY
GLUCOSE-CAPILLARY: 547 mg/dL — AB (ref 70–99)
GLUCOSE-CAPILLARY: 99 mg/dL (ref 70–99)
Glucose-Capillary: >600 mg/dL (ref 70–99)

## 2014-01-30 LAB — BASIC METABOLIC PANEL
Anion gap: 10 (ref 5–15)
BUN: 11 mg/dL (ref 6–23)
CO2: 28 mEq/L (ref 19–32)
Calcium: 7.8 mg/dL — ABNORMAL LOW (ref 8.4–10.5)
Chloride: 95 mEq/L — ABNORMAL LOW (ref 96–112)
Creatinine, Ser: 1.83 mg/dL — ABNORMAL HIGH (ref 0.50–1.35)
GFR calc Af Amer: 45 mL/min — ABNORMAL LOW (ref >90)
GFR, EST NON AFRICAN AMERICAN: 39 mL/min — AB (ref >90)
Glucose, Bld: 95 mg/dL (ref 70–99)
POTASSIUM: 3.6 meq/L — AB (ref 3.7–5.3)
SODIUM: 133 meq/L — AB (ref 137–147)

## 2014-01-30 LAB — CBC WITH DIFFERENTIAL/PLATELET
Basophils Absolute: 0 10*3/uL (ref 0.0–0.1)
Basophils Relative: 1 % (ref 0–1)
EOS PCT: 5 % (ref 0–5)
Eosinophils Absolute: 0.3 10*3/uL (ref 0.0–0.7)
HCT: 24.7 % — ABNORMAL LOW (ref 39.0–52.0)
Hemoglobin: 8.1 g/dL — ABNORMAL LOW (ref 13.0–17.0)
LYMPHS PCT: 20 % (ref 12–46)
Lymphs Abs: 1.2 10*3/uL (ref 0.7–4.0)
MCH: 28.3 pg (ref 26.0–34.0)
MCHC: 32.8 g/dL (ref 30.0–36.0)
MCV: 86.4 fL (ref 78.0–100.0)
MONOS PCT: 11 % (ref 3–12)
Monocytes Absolute: 0.7 10*3/uL (ref 0.1–1.0)
Neutro Abs: 3.8 10*3/uL (ref 1.7–7.7)
Neutrophils Relative %: 63 % (ref 43–77)
PLATELETS: 219 10*3/uL (ref 150–400)
RBC: 2.86 MIL/uL — AB (ref 4.22–5.81)
RDW: 16.2 % — ABNORMAL HIGH (ref 11.5–15.5)
WBC: 6.1 10*3/uL (ref 4.0–10.5)

## 2014-01-30 LAB — COMPREHENSIVE METABOLIC PANEL
ALT: 5 U/L (ref 0–53)
AST: 20 U/L (ref 0–37)
Albumin: 2.2 g/dL — ABNORMAL LOW (ref 3.5–5.2)
Alkaline Phosphatase: 91 U/L (ref 39–117)
Anion gap: 19 — ABNORMAL HIGH (ref 5–15)
BILIRUBIN TOTAL: 0.3 mg/dL (ref 0.3–1.2)
BUN: 43 mg/dL — ABNORMAL HIGH (ref 6–23)
CALCIUM: 8 mg/dL — AB (ref 8.4–10.5)
CHLORIDE: 84 meq/L — AB (ref 96–112)
CO2: 19 meq/L (ref 19–32)
Creatinine, Ser: 4.94 mg/dL — ABNORMAL HIGH (ref 0.50–1.35)
GFR calc Af Amer: 14 mL/min — ABNORMAL LOW (ref >90)
GFR calc non Af Amer: 12 mL/min — ABNORMAL LOW (ref >90)
Glucose, Bld: 781 mg/dL (ref 70–99)
POTASSIUM: 3.7 meq/L (ref 3.7–5.3)
Sodium: 122 mEq/L — ABNORMAL LOW (ref 137–147)
Total Protein: 8.8 g/dL — ABNORMAL HIGH (ref 6.0–8.3)

## 2014-01-30 LAB — LACTIC ACID, PLASMA: Lactic Acid, Venous: 2.4 mmol/L — ABNORMAL HIGH (ref 0.5–2.2)

## 2014-01-30 LAB — PROTIME-INR
INR: 1.76 — AB (ref 0.00–1.49)
PROTHROMBIN TIME: 20.7 s — AB (ref 11.6–15.2)

## 2014-01-30 LAB — PROCALCITONIN: Procalcitonin: 0.48 ng/mL

## 2014-01-30 LAB — MAGNESIUM: Magnesium: 1.9 mg/dL (ref 1.5–2.5)

## 2014-01-30 LAB — MRSA PCR SCREENING: MRSA by PCR: NEGATIVE

## 2014-01-30 LAB — PHOSPHORUS: PHOSPHORUS: 2.2 mg/dL — AB (ref 2.3–4.6)

## 2014-01-30 LAB — TROPONIN I: Troponin I: <0.3 ng/mL (ref <0.30)

## 2014-01-30 MED ORDER — NEPRO/CARBSTEADY PO LIQD
237.0000 mL | ORAL | Status: DC | PRN
  Filled 2014-01-30: qty 237

## 2014-01-30 MED ORDER — SODIUM CHLORIDE 0.9 % IV SOLN
INTRAVENOUS | Status: DC
  Administered 2014-01-30: 5.4 [IU]/h via INTRAVENOUS
  Filled 2014-01-30 (×2): qty 2.5

## 2014-01-30 MED ORDER — INSULIN ASPART 100 UNIT/ML ~~LOC~~ SOLN
0.0000 [IU] | SUBCUTANEOUS | Status: DC
  Administered 2014-01-31: 2 [IU] via SUBCUTANEOUS

## 2014-01-30 MED ORDER — PENTAFLUOROPROP-TETRAFLUOROETH EX AERO: 1.0000 "application " | INHALATION_SPRAY | CUTANEOUS | Status: DC | PRN

## 2014-01-30 MED ORDER — AMLODIPINE BESYLATE 10 MG PO TABS
10.0000 mg | ORAL_TABLET | Freq: Every day | ORAL | Status: DC
  Administered 2014-01-30 – 2014-02-01 (×3): 10 mg via ORAL
  Filled 2014-01-30 (×4): qty 1

## 2014-01-30 MED ORDER — CETYLPYRIDINIUM CHLORIDE 0.05 % MT LIQD: 7.0000 mL | Freq: Two times a day (BID) | OROMUCOSAL | Status: DC

## 2014-01-30 MED ORDER — SODIUM CHLORIDE 0.9 % IV SOLN: 100.0000 mL | INTRAVENOUS | Status: DC | PRN

## 2014-01-30 MED ORDER — SODIUM CHLORIDE 0.9 % IV SOLN
INTRAVENOUS | Status: DC
  Administered 2014-01-30: 13:00:00 via INTRAVENOUS

## 2014-01-30 MED ORDER — HEPARIN SODIUM (PORCINE) 1000 UNIT/ML IJ SOLN: 1000.0000 [IU] | INTRAMUSCULAR | Status: DC | PRN

## 2014-01-30 MED ORDER — FLUOXETINE HCL 10 MG PO TABS: 5.0000 mg | ORAL_TABLET | Freq: Every morning | ORAL | Status: DC

## 2014-01-30 MED ORDER — PANTOPRAZOLE SODIUM 40 MG PO TBEC
40.0000 mg | DELAYED_RELEASE_TABLET | Freq: Every day | ORAL | Status: DC
  Administered 2014-01-30 – 2014-02-02 (×4): 40 mg via ORAL
  Filled 2014-01-30 (×4): qty 1

## 2014-01-30 MED ORDER — WARFARIN - PHARMACIST DOSING INPATIENT: Freq: Every day | Status: DC

## 2014-01-30 MED ORDER — HEPARIN SODIUM (PORCINE) 5000 UNIT/ML IJ SOLN: 5000.0000 [IU] | Freq: Three times a day (TID) | INTRAMUSCULAR | Status: DC

## 2014-01-30 MED ORDER — ALBUTEROL SULFATE HFA 108 (90 BASE) MCG/ACT IN AERS: 2.0000 | INHALATION_SPRAY | RESPIRATORY_TRACT | Status: DC | PRN

## 2014-01-30 MED ORDER — LIDOCAINE HCL (PF) 1 % IJ SOLN: 5.0000 mL | INTRAMUSCULAR | Status: DC | PRN

## 2014-01-30 MED ORDER — LEVOTHYROXINE SODIUM 125 MCG PO TABS
250.0000 ug | ORAL_TABLET | Freq: Every day | ORAL | Status: DC
  Administered 2014-01-31 – 2014-02-02 (×3): 250 ug via ORAL
  Filled 2014-01-30 (×4): qty 2

## 2014-01-30 MED ORDER — LIDOCAINE-PRILOCAINE 2.5-2.5 % EX CREA
1.0000 "application " | TOPICAL_CREAM | CUTANEOUS | Status: DC | PRN
  Filled 2014-01-30: qty 5

## 2014-01-30 MED ORDER — DEXTROSE-NACL 5-0.45 % IV SOLN
INTRAVENOUS | Status: DC
Start: 2014-01-30 — End: 2014-01-30

## 2014-01-30 MED ORDER — HEPARIN SODIUM (PORCINE) 1000 UNIT/ML DIALYSIS: 20.0000 [IU]/kg | INTRAMUSCULAR | Status: DC | PRN

## 2014-01-30 MED ORDER — SODIUM CHLORIDE 0.9 % IV SOLN
INTRAVENOUS | Status: AC
Start: 2014-01-30 — End: 2014-01-30
  Administered 2014-01-30: 13:00:00 via INTRAVENOUS

## 2014-01-30 MED ORDER — CALCIUM ACETATE 667 MG PO CAPS
667.0000 mg | ORAL_CAPSULE | Freq: Three times a day (TID) | ORAL | Status: DC
  Filled 2014-01-30 (×2): qty 1

## 2014-01-30 MED ORDER — FLUOXETINE HCL 20 MG/5ML PO SOLN
5.0000 mg | Freq: Every day | ORAL | Status: DC
  Administered 2014-01-30 – 2014-02-02 (×4): 5 mg via ORAL
  Filled 2014-01-30 (×4): qty 5

## 2014-01-30 MED ORDER — PRAVASTATIN SODIUM 10 MG PO TABS
10.0000 mg | ORAL_TABLET | Freq: Every day | ORAL | Status: DC
  Administered 2014-01-30 – 2014-02-01 (×3): 10 mg via ORAL
  Filled 2014-01-30 (×4): qty 1

## 2014-01-30 MED ORDER — ALBUTEROL SULFATE (2.5 MG/3ML) 0.083% IN NEBU: 2.5000 mg | INHALATION_SOLUTION | RESPIRATORY_TRACT | Status: DC | PRN

## 2014-01-30 MED ORDER — DEXTROSE 50 % IV SOLN
25.0000 mL | INTRAVENOUS | Status: DC | PRN
  Filled 2014-01-30: qty 50

## 2014-01-30 MED ORDER — SEVELAMER CARBONATE 800 MG PO TABS
800.0000 mg | ORAL_TABLET | Freq: Three times a day (TID) | ORAL | Status: DC
  Filled 2014-01-30 (×2): qty 1

## 2014-01-30 MED ORDER — HEPARIN SODIUM (PORCINE) 1000 UNIT/ML DIALYSIS: 1000.0000 [IU] | INTRAMUSCULAR | Status: DC | PRN

## 2014-01-30 MED ORDER — CLONIDINE HCL 0.1 MG PO TABS
0.1000 mg | ORAL_TABLET | Freq: Three times a day (TID) | ORAL | Status: DC
  Administered 2014-01-30 – 2014-02-01 (×7): 0.1 mg via ORAL
  Filled 2014-01-30 (×11): qty 1

## 2014-01-30 MED ORDER — SODIUM CHLORIDE 0.9 % IJ SOLN: 3.0000 mL | INTRAMUSCULAR | Status: DC | PRN

## 2014-01-30 MED ORDER — DOCUSATE SODIUM 100 MG PO CAPS
100.0000 mg | ORAL_CAPSULE | Freq: Two times a day (BID) | ORAL | Status: DC
  Administered 2014-01-30 – 2014-02-02 (×7): 100 mg via ORAL
  Filled 2014-01-30 (×8): qty 1

## 2014-01-30 MED ORDER — METOPROLOL TARTRATE 50 MG PO TABS
75.0000 mg | ORAL_TABLET | Freq: Two times a day (BID) | ORAL | Status: DC
  Administered 2014-01-30 – 2014-02-01 (×5): 75 mg via ORAL
  Filled 2014-01-30 (×8): qty 1

## 2014-01-30 MED ORDER — CHLORHEXIDINE GLUCONATE 0.12 % MT SOLN
15.0000 mL | Freq: Two times a day (BID) | OROMUCOSAL | Status: DC
  Administered 2014-01-31: 15 mL via OROMUCOSAL
  Filled 2014-01-30 (×3): qty 15

## 2014-01-30 MED ORDER — ALTEPLASE 2 MG IJ SOLR
2.0000 mg | Freq: Once | INTRAMUSCULAR | Status: DC | PRN
  Filled 2014-01-30: qty 2

## 2014-01-30 MED ORDER — WARFARIN SODIUM 7.5 MG PO TABS
7.5000 mg | ORAL_TABLET | Freq: Once | ORAL | Status: AC
  Administered 2014-01-30: 7.5 mg via ORAL
  Filled 2014-01-30: qty 1

## 2014-01-30 MED ORDER — SODIUM CHLORIDE 0.9 % IJ SOLN
3.0000 mL | Freq: Two times a day (BID) | INTRAMUSCULAR | Status: DC
  Administered 2014-01-31 – 2014-02-02 (×5): 3 mL via INTRAVENOUS

## 2014-01-30 MED ORDER — SODIUM CHLORIDE 0.9 % IV SOLN: 250.0000 mL | INTRAVENOUS | Status: DC | PRN

## 2014-01-30 NOTE — H&P (Signed)
PULMONARY / CRITICAL CARE MEDICINE   Name: Marcus [name] MRN: [id number] DOB: [birth year]    ADMISSION DATE:  01/30/2014   REFERRING MD :  [facility]  CHIEF COMPLAINT:  DKA  INITIAL PRESENTATION: DKA  STUDIES:    SIGNIFICANT EVENTS: 10/16 [facility]   HISTORY OF PRESENT ILLNESS:   Marcus [name] is a 57 yo AAM(appears older) with known IDDM, ESRD, DVT on coumadin, hypothyroidism, depression, HTN, chronic pain and FTT who represents to [facility] from [place] with DKA and AMS. He was at dialysis 10/16 , found to have a glucose >11000 and was transferred to [facility] ED. He was started on insulin drip via his Lt I J tunneled HD cath and transferred to [facility]. PCCM has admitted and will placed him DKA protocol.    PAST MEDICAL HISTORY :   has a past medical history of Hypothyroidism; Hyperlipidemia; CVA (cerebrovascular accident); GI bleed; Pulmonary edema; Goiter; Depression; Cocaine abuse; Tobacco abuse; GERD (gastroesophageal reflux disease); Cataract; Insomnia; Chronic pain; HTN (hypertension); Neuromuscular disorder; Occlusion of carotid stent; Pneumonia; Diabetes mellitus; DKA (diabetic ketoacidosis); History of blood transfusion; On home oxygen therapy; Hemodialysis patient; CHF (congestive heart failure); DVT (deep venous thrombosis); ESRD (end stage renal disease) on dialysis; and COPD (chronic obstructive pulmonary disease).  has past surgical history that includes AV fistula placement (03/25/2008); AV fistula placement (04/23/2012); Arteriovenous graft placement (Left); Carotid stent insertion (Right); dyalisis catheter; Eye surgery (Bilateral); Thyroid surgery (01/28/2013); Lymph node biopsy (Right, 01/28/2013); Mass biopsy (N/A, 01/28/2013); Ligation arteriovenous gortex graft (Right, 02/27/2013); Ligation arteriovenous gortex graft (Left, 03/03/2013); Insertion of dialysis catheter (Right, 03/03/2013); AV fistula placement (Left, 03/14/2013); Arteriovenous goretex graft  removal (Left, 04/16/2013); Patch angioplasty (Left, 04/16/2013); Colonoscopy (09/16/13); Removal of a dialysis catheter (Right, 09/18/2013); Insertion of dialysis catheter (Left, 09/18/2013); and AV fistula placement (Right, 12/30/2013). Prior to Admission medications   Medication Sig Start Date End Date Taking? Authorizing Provider  amLODipine (NORVASC) 10 MG tablet Take 1 tablet (10 mg total) by mouth at bedtime. 07/15/13   Meredeth Ide, MD  calcium acetate (PHOSLO) 667 MG capsule Take 667 mg by mouth 3 (three) times daily with meals. Also takes with snacks    Historical Provider, MD  cloNIDine (CATAPRES) 0.1 MG tablet Take 0.1 mg by mouth 3 (three) times daily. Except on MWF, skip morning dose for dialysis.    Historical Provider, MD  docusate sodium (COLACE) 100 MG capsule Take 100 mg by mouth 2 (two) times daily.     Historical Provider, MD  FLUoxetine (PROZAC) 10 MG tablet Take 0.5 tablets (5 mg total) by mouth every morning. 04/21/13   Esperanza Sheets, MD  insulin aspart (NOVOLOG) 100 UNIT/ML injection Inject 0-5 Units into the skin 3 (three) times daily before meals. According to sliding scale. Above 150 = 3 units. Above 250 = 4 units. Above 350 = 5 units.    Historical Provider, MD  insulin glargine (LANTUS) 100 UNIT/ML injection Inject 0.08 mLs (8 Units total) into the skin 2 (two) times daily. 07/15/13   Meredeth Ide, MD  levothyroxine (SYNTHROID, LEVOTHROID) 125 MCG tablet Take 250 mcg by mouth daily before breakfast.    Historical Provider, MD  metoprolol (LOPRESSOR) 50 MG tablet Take 1.5 tablets (75 mg total) by mouth 2 (two) times daily. Hold morning dose on dialysis days 04/21/13   Esperanza Sheets, MD  multivitamin (RENA-VIT) TABS tablet Take 1 tablet by mouth daily.    Historical Provider, MD  omeprazole (PRILOSEC) 20  MG capsule Take 20 mg by mouth daily.    Historical Provider, MD  oxyCODONE-acetaminophen (PERCOCET/ROXICET) 5-325 MG per tablet Take 1 tablet by mouth every 6 (six) hours as  needed. 12/30/13   Lars MageEmma M Collins, PA-C  pravastatin (PRAVACHOL) 10 MG tablet Take 10 mg by mouth at bedtime.    Historical Provider, MD  PROAIR HFA 108 (90 BASE) MCG/ACT inhaler Inhale 2 puffs into the lungs every 4 (four) hours as needed for wheezing or shortness of breath.  12/08/12   Historical Provider, MD  promethazine (PHENERGAN) 25 MG tablet Take 25 mg by mouth every 6 (six) hours as needed for nausea or vomiting.    Historical Provider, MD  sevelamer carbonate (RENVELA) 800 MG tablet Take 800 mg by mouth 3 (three) times daily with meals.  10/23/12   Historical Provider, MD  temazepam (RESTORIL) 15 MG capsule Take 15 mg by mouth at bedtime.     Historical Provider, MD  warfarin (COUMADIN) 6 MG tablet Take 9 mg by mouth daily.    Historical Provider, MD   Allergies  Allergen Reactions  . Penicillins Other (See Comments)    Unknown reaction    FAMILY HISTORY:  indicated that his mother is alive. He indicated that his father is deceased. He indicated that his sister is alive. He indicated that his brother is alive.  SOCIAL HISTORY:  reports that he has been smoking Cigarettes.  He has been smoking about 0.25 packs per day. He has never used smokeless tobacco. He reports that he does not drink alcohol or use illicit drugs.  REVIEW OF SYSTEMS:  NA  SUBJECTIVE:   VITAL SIGNS: Temp:  [97.2 F (36.2 C)] 97.2 F (36.2 C) (10/16 1158) Pulse Rate:  [63] 63 (10/16 1158) Resp:  [17] 17 (10/16 1158) SpO2:  [100 %] 100 % (10/16 1158) HEMODYNAMICS:   VENTILATOR SETTINGS:   INTAKE / OUTPUT: No intake or output data in the 24 hours ending 01/30/14 1225  PHYSICAL EXAMINATION: General:  Frail wasted AAM Neuro:  Follows commands, somewhat stunned HEENT: No JVD/LAN, left IJ hd cath Cardiovascular:  HSR RRR Lungs:  Mild rhonchi, decreased in bases Abdomen:  Soft +bs Musculoskeletal:  intact Skin:  Dry. Warm. Poor skin tugor  LABS:  CBC No results found for this basename: WBC, HGB, HCT,  PLT,  in the last 168 hours Coag's No results found for this basename: APTT, INR,  in the last 168 hours BMET No results found for this basename: NA, K, CL, CO2, BUN, CREATININE, GLUCOSE,  in the last 168 hours Electrolytes No results found for this basename: CALCIUM, MG, PHOS,  in the last 168 hours Sepsis Markers No results found for this basename: LATICACIDVEN, PROCALCITON, O2SATVEN,  in the last 168 hours ABG No results found for this basename: PHART, PCO2ART, PO2ART,  in the last 168 hours Liver Enzymes No results found for this basename: AST, ALT, ALKPHOS, BILITOT, ALBUMIN,  in the last 168 hours Cardiac Enzymes No results found for this basename: TROPONINI, PROBNP,  in the last 168 hours Glucose No results found for this basename: GLUCAP,  in the last 168 hours  Imaging No results found.   ASSESSMENT / PLAN:  PULMONARY OETT A: No acute issue P:   May be component of aspiration   CARDIOVASCULAR CVLleft i j tunnled hd cath>> A:  Htn as outpatient P:  Keep on home meds  RENAL A:  ESRD  m w f P:   Renal consult  GASTROINTESTINAL A:  GI protection P:   PPI  HEMATOLOGIC A:   Chronic coumadin for dvt P:  Hold coumadin till INR back  INFECTIOUS A:  No overt infection but some labs pending P:   BCx2 10/16>> UC 10/16  Abx: , start date, day /  ENDOCRINE A:   DKA Hypothyroid P:   DKA protocol Check for infection Synthroid   NEUROLOGIC A:   Decreased LOC. presumed P:   RASS goal: 1-2 Hold all sedation May need ct head   Family updated:  No family at bedside  Interdisciplinary Family Meeting v Palliative Care Meeting:    TODAY'S SUMMARY:  Marcus [name] is a 58 yo AAM(appears older) with known IDDM, ESRD, DVT on coumadin, hypothyroidism, depression, HTN, chronic pain and FTT who [facility] and [facility]. He was at dialysis 10/16 , found to have a glucose >11000 and [facility]. He was started  on insulin drip via his Lt I J tunneled HD cath and [facility]. PCCM has admitted and will placed him DKA protocol.   Brett CanalesSteve Minor ACNP [facility] PCCM Pager [phone] till 3 pm If no answer page [phone] 01/30/2014, 12:48 PM   I have personally obtained a history, examined the patient, evaluated laboratory and imaging results, formulated the assessment and plan and placed orders. CRITICAL CARE: The patient is critically ill with multiple organ systems failure and requires high complexity decision making for assessment and support, frequent evaluation and titration of therapies, application of advanced monitoring technologies and extensive interpretation of multiple databases. Critical Care Time devoted to patient care services described in this note is 45 minutes.   Levy Pupaobert Kerrie Timm, MD, PhD 01/30/2014, 3:43 PM [facility] [phone] or if no answer [phone]

## 2014-01-30 NOTE — Care Management Note (Addendum)
    Page 1 of 1   02/02/2014     12:12:41 PM CARE MANAGEMENT NOTE 02/02/2014  Patient:  Marcus [name],Marcus [name]   Account Number:  [id number]  Date Initiated:  01/30/2014  Documentation initiated by:  Junius CreamerWELL,DEBBIE  Subjective/Objective Assessment:   adm w elev glucose  lives with sister, who is incarerated and aide is not coming by to help with meds.     Action/Plan:   lives w wife, pcp dr Karie Schwalbeinran haque   Anticipated DC Date:  02/02/2014   Anticipated DC Plan:  SKILLED NURSING FACILITY  In-house referral  Clinical Social Worker      DC Planning Services  CM consult      Choice offered to / List presented to:             Status of service:  Completed, signed off Medicare Important Message given?  YES (If response is "NO", the following Medicare IM given date fields will be blank) Date Medicare IM given:  02/02/2014 Medicare IM given by:  Letha CapeAYLOR,Burnett Lieber Date Additional Medicare IM given:   Additional Medicare IM given by:    Discharge Disposition:  SKILLED NURSING FACILITY  Per UR Regulation:  Reviewed for med. necessity/level of care/duration of stay  If discussed at Long Length of Stay Meetings, dates discussed:    Comments:  02/02/14 1211 Everardo Pacificeborah Nou Chard Rn, BSN [phone] patient is for dc today to snf, CSW following.

## 2014-01-30 NOTE — Procedures (Signed)
Patient was seen on dialysis and the procedure was supervised.  BFR 400  Via PC ( refused for us to use thigh AVG)  BP is  125/66.   Patient appears to be tolerating treatment well  Sachi Boulay A 01/30/2014

## 2014-01-30 NOTE — Progress Notes (Signed)
Patient in HD upon my arrival this shift. Patient returned to the unit at this time.

## 2014-01-30 NOTE — Progress Notes (Signed)
ANTICOAGULATION CONSULT NOTE - Initial Consult  Pharmacy Consult for Warfarin Indication: DVT - Hx of  Allergies  Allergen Reactions  . Penicillins Other (See Comments)    Unknown reaction    Patient Measurements: Height: 6\' 2"  (188 cm) Weight: 144 lb 6.4 oz (65.5 kg) IBW/kg (Calculated) : 82.2  Vital Signs: Temp: 97.5 F (36.4 C) (10/16 1710) Temp Source: Oral (10/16 1710) BP: 125/66 mmHg (10/16 1730) Pulse Rate: 68 (10/16 1730)  Labs:  Recent Labs  01/30/14 1410  HGB 8.1*  HCT 24.7*  PLT 219  LABPROT 20.7*  INR 1.76*  CREATININE 4.94*  TROPONINI <0.30    Estimated Creatinine Clearance: 15.1 ml/min (by C-G formula based on Cr of 4.94).   Medical History: Past Medical History  Diagnosis Date  . Hypothyroidism   . Hyperlipidemia   . CVA (cerebrovascular accident)     hx CVA x 2  . GI bleed     2006, mallory-weiss tear  . Pulmonary edema     nov 2011, dry wt decreased  . Goiter     by CT Apr 2013  . Depression   . Cocaine abuse     past history  . Tobacco abuse     history of  . GERD (gastroesophageal reflux disease)   . Cataract   . Insomnia   . Chronic pain   . HTN (hypertension)     se[place] Dr. Ardelle [place]aque, [place]  . Neuromuscular disorder     diabetic neuropathy  . Occlusion of carotid stent     Per patients nursing aid  . Pneumonia     hx of  . Diabetes mellitus     Sees Dr. Reather LittlerAjay Kumar [phone] -- fasting blood 70-110s  . DKA (diabetic ketoacidosis)     severe, Dec 2011  . History of blood transfusion   . On home oxygen therapy     2l nasal cannula at night  . Hemodialysis patient     M, W, F  . CHF (congestive heart failure)   . DVT (deep venous thrombosis)   . ESRD (end stage renal disease) on dialysis     hd - mwf - [place]  . COPD (chronic obstructive pulmonary disease)      Assessment: [age] yom with ESRD admitted with DKA.  He was on warfarin as an outpatient for Hx DVT.  Admit INR 1.76 cbc low stable and no bleeding noted.   Discussed with [facility] - restart warfarin.   There are no outpatient EPIC AC notes but last 2 d/c summaries list warfarin 7.5gm daily and 9mg  daily.  Will error on the lower side tonight and titrate dose to response.  Goal of Therapy:  INR 2-3 Monitor platelets by anticoagulation protocol: Yes   Plan:  Warfarin 7.5mg  x1 tonight  Daily Protime  Leota SauersLisa Maghen Group Pharm.D. CPP, BCPS Clinical Pharmacist [phone] 01/30/2014 6:02 PM

## 2014-01-30 NOTE — Progress Notes (Signed)
Patient arrived via EMS. Vitals stable HR 76. RR 17, Bp  134/59, saturation 99% on room air. Insulin gtt infusing at 13.2 units. NS insuing at 100 ml/hr. A/Ox4. Follows commands, lethargic.

## 2014-01-30 NOTE — Consult Note (Signed)
Reason for Consult: To manage dialysis and dialysis related needs Referring Physician: [name] is an 58 y.o. male with PM hx significant for DM, HTN, hyperlipidemia, hx of CVA, cocaine use, COPD as well as ESRD - dialysis in [place] MWF, stays his full time and gets to EDW.  He presented there this AM lethargic- glucose check was too high for monitor so transferred to [facility] ER which confirmed glucose over 1000 and transferred him here for further management.  He is currently somnolent- just getting IV access to get insulin drip and NS bolus   Dialyzes at [facility]  EDW 57.5. HD Bath 2 K, 2.25 calc, Dialyzer 180, Heparin yes. Access PC but has right thigh AVG placed 9/15- will try to use this admit.  Past Medical History  Diagnosis Date  . Hypothyroidism   . Hyperlipidemia   . CVA (cerebrovascular accident)     hx CVA x 2  . GI bleed     2006, mallory-weiss tear  . Pulmonary edema     nov 2011, dry wt decreased  . Goiter     by CT Apr 2013  . Depression   . Cocaine abuse     past history  . Tobacco abuse     history of  . GERD (gastroesophageal reflux disease)   . Cataract   . Insomnia   . Chronic pain   . HTN (hypertension)     sees Dr. Ardelle [name], Marcus [name]  . Neuromuscular disorder     diabetic neuropathy  . Occlusion of carotid stent     Per patients nursing aid  . Pneumonia     hx of  . Diabetes mellitus     Sees Dr. Reather [name] [phone] -- fasting blood 70-110s  . DKA (diabetic ketoacidosis)     severe, Dec 2011  . History of blood transfusion   . On home oxygen therapy     2l nasal cannula at night  . Hemodialysis patient     M, W, F  . CHF (congestive heart failure)   . DVT (deep venous thrombosis)   . ESRD (end stage renal disease) on dialysis     hd - mwf -   . COPD (chronic obstructive pulmonary disease)     Past Surgical History  Procedure Laterality Date  . Av fistula placement  03/25/2008    right forearm  . Av fistula  placement  04/23/2012    Procedure: INSERTION OF ARTERIOVENOUS (AV) GORE-TEX GRAFT THIGH;  Surgeon: Marcus Kerns, MD;  Location: [facility] OR;  Service: Vascular;  Laterality: Left;  . Arteriovenous graft placement Left     thigh  . Carotid stent insertion Right   . Dyalisis catheter    . Eye surgery Bilateral     cataracts  . Thyroid surgery  01/28/2013    Dr Marcus [name]  . Lymph node biopsy Right 01/28/2013    Procedure: LYMPH NODE BIOPSY;  Surgeon: Marcus Heckler, MD;  Location: [facility] OR;  Service: General;  Laterality: Right;  . Mass biopsy N/A 01/28/2013    Procedure: NECK EXPLORATION;  Surgeon: Marcus Heckler, MD;  Location: [facility] OR;  Service: General;  Laterality: N/A;  . Ligation arteriovenous gortex graft Right 02/27/2013    Procedure: LIGATION ARTERIOVENOUS GORTEX GRAFT;  Surgeon: Marcus Hint, MD;  Location[facility] OR;  Service: Vascular;  Laterality: Right;  . Ligation arteriovenous gortex graft Left 03/03/2013    Procedure: LIGATION Left ARTERIOVENOUS GORTEX Thigh GRAFT;  Surgeon: Marcus John  Rolena InfanteL Chen, MD;  [facility];  Service: Vascular;  Laterality: Left;  . Insertion of dialysis catheter Right 03/03/2013    Procedure: INSERTION OF DIALYSIS CATHETER;  Surgeon: Marcus HertzBrian L Chen, MD;  [facility];  Service: Vascular;  Laterality: Right;  . Av fistula placement Left 03/14/2013    Procedure: INSERTION OF  ARTERIOVENOUS (AV) GORE-TEX GRAFT  LEFT THIGH;  Surgeon: Marcus Earthlyodd F Early, MD;  [facility];  Service: Vascular;  Laterality: Left;  . Avgg removal Left 04/16/2013    Procedure: REMOVAL OF ARTERIOVENOUS GORETEX GRAFT Left Thigh;  Surgeon: Marcus LibmanVance W Brabham, MD;  [facility];  Service: Vascular;  Laterality: Left;  . Patch angioplasty Left 04/16/2013    Procedure: Patch Angioplasty of left femoral artery;  Surgeon: Marcus LibmanVance W Brabham, MD;  [facility];  Service: Vascular;  Laterality: Left;  . Colonoscopy  09/16/13  . Removal of a dialysis catheter Right 09/18/2013    Procedure: REMOVAL OF A  DIALYSIS CATHETER-RIGHT FEMORAL VEIN;  Surgeon: Marcus OchoaJames D Lawson, MD;  [facility];  Service: Vascular;  Laterality: Right;  . Insertion of dialysis catheter Left 09/18/2013    Procedure: INSERTION OF DIALYSIS CATHETER-LEFT NECK VS LEFT THIGH;  Surgeon: Marcus OchoaJames D Lawson, MD;  [facility];  Service: Vascular;  Laterality: Left;  . Av fistula placement Right 12/30/2013    Procedure: INSERTION OF ARTERIOVENOUS (AV) GORE-TEX GRAFT THIGH;  Surgeon: Marcus HertzBrian L Chen, MD;  Location: [facility] OR;  Service: Vascular;  Laterality: Right;    Family History  Problem Relation Age of Onset  . Diabetes Sister   . Diabetes Brother   . Hypertension Brother   . Heart disease Brother   . Hypertension Mother   . Heart attack Mother   . Hyperlipidemia Mother   . Heart disease Mother     before age 58  . Hypertension Father     Social History:  reports that he has been smoking Cigarettes.  He has been smoking about 0.25 packs per day. He has never used smokeless tobacco. He reports that he does not drink alcohol or use illicit drugs.  Allergies:  Allergies  Allergen Reactions  . Penicillins Other (See Comments)    Unknown reaction    Medications: I have reviewed the patient's current medications. Calcitriol 0.5 on MWF aranesp 120 weekly   No results found for this or any previous visit (from the past 48 hour(s)).  Dg Chest Port 1 View  01/30/2014   CLINICAL DATA:  Diabetic ketoacidosis, altered mental status. Dialysis earlier today. Evaluate for infiltrates.  EXAM: PORTABLE CHEST - 1 VIEW  COMPARISON:  01/30/2014  FINDINGS: Left dialysis catheter remains in place, unchanged. Heart is upper limits normal in size. No confluent airspace opacities or effusions. No edema.  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 No acute bony abnormality.  IMPRESSION: No acute cardiopulmonary disease.  No change since earlier today.   Electronically Signed   By: Charlett NoseKevin  Dover M.D.   On: 01/30/2014 13:17     ROS: really unable to obtain secondary to decreased mental status  Pulse 63, temperature 97.2 F (36.2 C), temperature source Oral, resp. rate 17, height 6\' 2"  (1.88 m), SpO2 100.00%. General appearance: toxic Eyes: conjunctivae/corneas clear. PERRL, EOM's intact. Fundi benign. Neck: no adenopathy, no carotid bruit, no JVD, supple, symmetrical, trachea midline and thyroid not enlarged, symmetric, no tenderness/mass/nodules Resp: diminished breath sounds bibasilar Cardio: regular rate and rhythm, S1, S2 normal, no murmur, click, rub or gallop  GI: soft, non-tender; bowel sounds normal; no masses,  no organomegaly Extremities: edema some - also looks puffy in face right thigh AVG with good thrill and bruit  Assessment/Plan: 58 year old BM with many medical issues including ESRD presents with DKA 1 DKA- patient does have a history of this.  We will do HD this PM and that should help tremendously- also continue insulin drip but hold off on too much hydration- already got bolus but will stop maintenance fluids 2 ESRD[facility]- but AVG in thigh is a month old and patent so will use with this treatment 3 Hypertension: BP not an issue- he appears volume overloaded by appearance  4. Anemia of ESRD: hgb has been low- on aranesp 120 which is a pretty big dose, will continue here 5. Metabolic Bone Disease: numbers have been good on his current regimen of calcitriol 0.5 three times a week and a combo of phoslo and renvela for binding.  Will not worry about these meds until taking PO's    Thank you for this consult, we will follow with you  Rosalinda Seaman A 01/30/2014, 1:27 PM

## 2014-01-30 NOTE — Progress Notes (Signed)
INITIAL NUTRITION ASSESSMENT  DOCUMENTATION CODES Per approved criteria  -Non-severe (moderate) malnutrition in the context of chronic illness -Underweight   INTERVENTION: - Once diet upgraded, add Nepro Shake po BID, each supplement provides 425 kcal and 19 grams protein - RD to follow for nutrition care plan  NUTRITION DIAGNOSIS: Increased nutrient needs related to ESRD on HD as evidenced by estimated nutrition needs.   Goal: Pt to meet >/= 90% of their estimated nutrition needs   Monitor:  Weight trend, po intake, acceptance of supplements, labs, I/O's  Reason for Assessment: Malnutrition screening tool, Low BMI  58 y.o. male  Admitting Dx: <principal problem not specified>  ASSESSMENT: 58 yo AAM(appears older) with known IDDM, ESRD, DVT on coumadin, hypothyroidism, depression, HTN, chronic pain and FTT who represents to [facility] from [place] with DKA and AMS. He was at dialysis 10/16 , found to have a glucose >11000 and was transferred to [facility] ED.  - Pt known to Clinical Nutrition during previous hospitalizations; severe malnutrition dx ongoing, Pt is currently NPO; pt with increased nutrient needs given ESRD, had received Nepro or Glucerna supplements during previous hospitalizations; RD to order at this time.  - Pt reported that he has not been eating well. He said that he is unsure if he has lost weight or not. He ate a ham sandwich yesterday, but that was it per pt report.   Nutrition Focused Physical Exam:  Subcutaneous Fat:  Orbital Region: WNL Upper Arm Region: moderate depletion Thoracic and Lumbar Region: moderate depletion  Muscle:  Temple Region: moderate depletion Clavicle Bone Region: mild depletion Clavicle and Acromion Bone Region: WNL Scapular Bone Region: n/a Dorsal Hand: WNL Patellar Region: severe depletion Anterior Thigh Region: severe depletion Posterior Calf Region: severe depletion  Edema: none  Height: Ht Readings from Last 1  Encounters:  01/30/14 6\' 2"  (1.88 m)    Weight: Wt Readings from Last 1 Encounters:  12/30/13 129 lb (58.514 kg)    Ideal Body Weight: 82.2 kg  % Ideal Body Weight: 71%  Wt Readings from Last 10 Encounters:  12/30/13 129 lb (58.514 kg)  12/30/13 129 lb (58.514 kg)  11/18/13 129 lb 8 oz (58.741 kg)  07/15/13 133 lb 13.1 oz (60.7 kg)  05/28/13 124 lb 5.4 oz (56.4 kg)  05/12/13 135 lb (61.236 kg)  04/29/13 137 lb 9.6 oz (62.415 kg)  04/21/13 132 lb 7.9 oz (60.099 kg)  04/21/13 132 lb 7.9 oz (60.099 kg)  03/15/13 137 lb 12.6 oz (62.5 kg)    Usual Body Weight: 129 lbs  % Usual Body Weight: 100%  BMI:  There is no weight on file to calculate BMI.  Estimated Nutritional Needs: Kcal: 1800-2000 Protein: 80-90 g Fluid: 1200 ml  Skin: Intact  Diet Order: NPO  EDUCATION NEEDS: -No education needs identified at this time   Intake/Output Summary (Last 24 hours) at 01/30/14 1433 Last data filed at 01/30/14 1400  Gross per 24 hour  Intake 101.25 ml  Output      0 ml  Net 101.25 ml    Last BM:  Prior to admission  Labs:  No results found for this basename: NA, K, CL, CO2, BUN, CREATININE, CALCIUM, MG, PHOS, GLUCOSE,  in the last 168 hours  CBG (last 3)  No results found for this basename: GLUCAP,  in the last 72 hours  Scheduled Meds: . amLODipine  10 mg Oral QHS  . calcium acetate  667 mg Oral TID WC  . cloNIDine  0.1 mg  Oral TID  . docusate sodium  100 mg Oral BID  . FLUoxetine  5 mg Oral Daily  . heparin  5,000 Units Subcutaneous 3 times per day  . [START ON 01/31/2014] levothyroxine  250 mcg Oral QAC breakfast  . metoprolol  75 mg Oral BID  . pantoprazole  40 mg Oral Daily  . pravastatin  10 mg Oral QHS  . sevelamer carbonate  800 mg Oral TID WC  . sodium chloride  3 mL Intravenous Q12H    Continuous Infusions: . sodium chloride Stopped (01/30/14 1342)  . dextrose 5 % and 0.45% NaCl Stopped (01/30/14 1312)  . insulin (NOVOLIN-R) infusion 9.7 Units/hr  (01/30/14 1422)    Past Medical History  Diagnosis Date  . Hypothyroidism   . Hyperlipidemia   . CVA (cerebrovascular accident)     hx CVA x 2  . GI bleed     2006, mallory-weiss tear  . Pulmonary edema     nov 2011, dry wt decreased  . Goiter     by CT Apr 2013  . Depression   . Cocaine abuse     past history  . Tobacco abuse     history of  . GERD (gastroesophageal reflux disease)   . Cataract   . Insomnia   . Chronic pain   . HTN (hypertension)     sees Dr. Ardelle [place]aque, [place]  . Neuromuscular disorder     diabetic neuropathy  . Occlusion of carotid stent     Per patients nursing aid  . Pneumonia     hx of  . Diabetes mellitus     Sees Dr. Reather LittlerAjay Kumar [phone] -- fasting blood 70-110s  . DKA (diabetic ketoacidosis)     severe, Dec 2011  . History of blood transfusion   . On home oxygen therapy     2l nasal cannula at night  . Hemodialysis patient     M, W, F  . CHF (congestive heart failure)   . DVT (deep venous thrombosis)   . ESRD (end stage renal disease) on dialysis     hd - [facility] - [place]  . COPD (chronic obstructive pulmonary disease)     Past Surgical History  Procedure Laterality Date  . Av fistula placement  03/25/2008    right forearm  . Av fistula placement  04/23/2012    Procedure: INSERTION OF ARTERIOVENOUS (AV) GORE-TEX GRAFT THIGH;  Surgeon: Sherren Kernsharles E Fields, MD;  [facility];  Service: Vascular;  Laterality: Left;  . Arteriovenous graft placement Left     thigh  . Carotid stent insertion Right   . Dyalisis catheter    . Eye surgery Bilateral     cataracts  . Thyroid surgery  01/28/2013    Dr Gerrit FriendsGerkin  . Lymph node biopsy Right 01/28/2013    Procedure: LYMPH NODE BIOPSY;  Surgeon: Velora Hecklerodd M Gerkin, MD;  [facility];  Service: General;  Laterality: Right;  . Mass biopsy N/A 01/28/2013    Procedure: NECK EXPLORATION;  Surgeon: Velora Hecklerodd M Gerkin, MD;  [facility];  Service: General;  Laterality: N/A;  . Ligation arteriovenous gortex  graft Right 02/27/2013    Procedure: LIGATION ARTERIOVENOUS GORTEX GRAFT;  Surgeon: Chuck Hinthristopher S Dickson, MD;  [facility];  Service: Vascular;  Laterality: Right;  . Ligation arteriovenous gortex graft Left 03/03/2013    Procedure: LIGATION Left ARTERIOVENOUS GORTEX Thigh GRAFT;  Surgeon: Fransisco HertzBrian L Chen, MD;  [facility];  Service: Vascular;  Laterality: Left;  .  Insertion of dialysis catheter Right 03/03/2013    Procedure: INSERTION OF DIALYSIS CATHETER;  Surgeon: Fransisco HertzBrian L Chen, MD;  [facility];  Service: Vascular;  Laterality: Right;  . Av fistula placement Left 03/14/2013    Procedure: INSERTION OF  ARTERIOVENOUS (AV) GORE-TEX GRAFT  LEFT THIGH;  Surgeon: Larina Earthlyodd F Early, MD;  [facility];  Service: Vascular;  Laterality: Left;  . Avgg removal Left 04/16/2013    Procedure: REMOVAL OF ARTERIOVENOUS GORETEX GRAFT Left Thigh;  Surgeon: Nada LibmanVance W Brabham, MD;  [facility];  Service: Vascular;  Laterality: Left;  . Patch angioplasty Left 04/16/2013    Procedure: Patch Angioplasty of left femoral artery;  Surgeon: Nada LibmanVance W Brabham, MD;  [facility];  Service: Vascular;  Laterality: Left;  . Colonoscopy  09/16/13  . Removal of a dialysis catheter Right 09/18/2013    Procedure: REMOVAL OF A DIALYSIS CATHETER-RIGHT FEMORAL VEIN;  Surgeon: Pryor OchoaJames D Lawson, MD;  [facility];  Service: Vascular;  Laterality: Right;  . Insertion of dialysis catheter Left 09/18/2013    Procedure: INSERTION OF DIALYSIS CATHETER-LEFT NECK VS LEFT THIGH;  Surgeon: Pryor OchoaJames D Lawson, MD;  [facility];  Service: Vascular;  Laterality: Left;  . Av fistula placement Right 12/30/2013    Procedure: INSERTION OF ARTERIOVENOUS (AV) GORE-TEX GRAFT THIGH;  Surgeon: Fransisco HertzBrian L Chen, MD;  [facility];  Service: Vascular;  Laterality: Right;    Emmaline KluverHaley Aloni Chuang RD, LDN

## 2014-01-31 ENCOUNTER — Inpatient Hospital Stay (HOSPITAL_COMMUNITY): Payer: Medicare Other

## 2014-01-31 DIAGNOSIS — E1010 Type 1 diabetes mellitus with ketoacidosis without coma: Principal | ICD-10-CM

## 2014-01-31 LAB — BASIC METABOLIC PANEL
Anion gap: 12 (ref 5–15)
BUN: 12 mg/dL (ref 6–23)
CALCIUM: 7.8 mg/dL — AB (ref 8.4–10.5)
CO2: 27 mEq/L (ref 19–32)
CREATININE: 2.26 mg/dL — AB (ref 0.50–1.35)
Chloride: 96 mEq/L (ref 96–112)
GFR calc non Af Amer: 30 mL/min — ABNORMAL LOW (ref >90)
GFR, EST AFRICAN AMERICAN: 35 mL/min — AB (ref >90)
Glucose, Bld: 98 mg/dL (ref 70–99)
Potassium: 3.8 mEq/L (ref 3.7–5.3)
Sodium: 135 mEq/L — ABNORMAL LOW (ref 137–147)

## 2014-01-31 LAB — PHOSPHORUS: Phosphorus: 1.2 mg/dL — ABNORMAL LOW (ref 2.3–4.6)

## 2014-01-31 LAB — GLUCOSE, CAPILLARY
GLUCOSE-CAPILLARY: 113 mg/dL — AB (ref 70–99)
GLUCOSE-CAPILLARY: 128 mg/dL — AB (ref 70–99)
GLUCOSE-CAPILLARY: 170 mg/dL — AB (ref 70–99)
Glucose-Capillary: 101 mg/dL — ABNORMAL HIGH (ref 70–99)
Glucose-Capillary: 107 mg/dL — ABNORMAL HIGH (ref 70–99)
Glucose-Capillary: 120 mg/dL — ABNORMAL HIGH (ref 70–99)
Glucose-Capillary: 179 mg/dL — ABNORMAL HIGH (ref 70–99)
Glucose-Capillary: 181 mg/dL — ABNORMAL HIGH (ref 70–99)
Glucose-Capillary: 62 mg/dL — ABNORMAL LOW (ref 70–99)

## 2014-01-31 LAB — MAGNESIUM: Magnesium: 1.8 mg/dL (ref 1.5–2.5)

## 2014-01-31 LAB — CBC
HEMATOCRIT: 25.9 % — AB (ref 39.0–52.0)
Hemoglobin: 8.7 g/dL — ABNORMAL LOW (ref 13.0–17.0)
MCH: 28.2 pg (ref 26.0–34.0)
MCHC: 33.6 g/dL (ref 30.0–36.0)
MCV: 84.1 fL (ref 78.0–100.0)
Platelets: 234 10*3/uL (ref 150–400)
RBC: 3.08 MIL/uL — ABNORMAL LOW (ref 4.22–5.81)
RDW: 15.7 % — AB (ref 11.5–15.5)
WBC: 6.8 10*3/uL (ref 4.0–10.5)

## 2014-01-31 LAB — TROPONIN I

## 2014-01-31 LAB — PROTIME-INR
INR: 2.11 — AB (ref 0.00–1.49)
Prothrombin Time: 23.8 seconds — ABNORMAL HIGH (ref 11.6–15.2)

## 2014-01-31 LAB — PROCALCITONIN: Procalcitonin: 0.56 ng/mL

## 2014-01-31 MED ORDER — DARBEPOETIN ALFA-POLYSORBATE 150 MCG/0.3ML IJ SOLN
120.0000 ug | INTRAMUSCULAR | Status: DC
  Administered 2014-02-02: 120 ug via INTRAVENOUS
  Filled 2014-01-31: qty 0.3

## 2014-01-31 MED ORDER — CALCITRIOL 0.25 MCG PO CAPS
0.2500 ug | ORAL_CAPSULE | ORAL | Status: DC
  Administered 2014-02-02: 0.25 ug via ORAL
  Filled 2014-01-31: qty 1

## 2014-01-31 MED ORDER — DEXTROSE 50 % IV SOLN
25.0000 mL | Freq: Once | INTRAVENOUS | Status: AC | PRN
  Administered 2014-01-31: 25 mL via INTRAVENOUS

## 2014-01-31 MED ORDER — INSULIN ASPART 100 UNIT/ML ~~LOC~~ SOLN
0.0000 [IU] | Freq: Three times a day (TID) | SUBCUTANEOUS | Status: DC
  Administered 2014-01-31: 2 [IU] via SUBCUTANEOUS
  Administered 2014-02-01 – 2014-02-02 (×2): 1 [IU] via SUBCUTANEOUS

## 2014-01-31 MED ORDER — WARFARIN SODIUM 7.5 MG PO TABS
7.5000 mg | ORAL_TABLET | Freq: Once | ORAL | Status: AC
  Administered 2014-01-31: 7.5 mg via ORAL
  Filled 2014-01-31: qty 1

## 2014-01-31 MED ORDER — INSULIN GLARGINE 100 UNIT/ML ~~LOC~~ SOLN
5.0000 [IU] | Freq: Two times a day (BID) | SUBCUTANEOUS | Status: DC
  Administered 2014-02-01 – 2014-02-02 (×4): 5 [IU] via SUBCUTANEOUS
  Filled 2014-01-31 (×6): qty 0.05

## 2014-01-31 NOTE — Progress Notes (Signed)
Received Diabetes Coordinator consult. Spoke with Staff RN on the phone while he was in the room with the patient.  Patient was asked about getting his insulin every day at home. He stated that a nurse comes to his house everyday to give him his medicines. Question whether patient took his insulin yesterday when blood sugar was so high.  Will consult case management to check on home status with home health care and living conditions. Patient unable to tell nurse how much insulin he takes at home. Patient takes Lantus 8 units twice a day for home dose per chart review.  The Novolog scale at home is blood sugar >150=3 units, > 250=4 units, >350=5 units.  Recommend starting Lantus 5 units tonight and then continuing Lantus 5 units BID or increasing to 8 units BID if CBGs continue to rise. Continue on Novolog SENSITIVE correction scale TID and a SENSITIVE HS scale.  Smith MinceKendra Shreya Lacasse RN BSN CDE

## 2014-01-31 NOTE — Progress Notes (Signed)
ANTICOAGULATION CONSULT NOTE - Initial Consult  Pharmacy Consult for Warfarin Indication: DVT - Hx of   Labs:  Recent Labs  01/30/14 1410 01/30/14 2231 01/31/14 0239  HGB 8.1*  --  8.7*  HCT 24.7*  --  25.9*  PLT 219  --  234  LABPROT 20.7*  --  23.8*  INR 1.76*  --  2.11*  CREATININE 4.94* 1.83* 2.26*  TROPONINI <0.30 <0.30 <0.30    Estimated Creatinine Clearance: 31.3 ml/min (by C-G formula based on Cr of 2.26).    Assessment: [age] yom with ESRD admitted with DKA.  He was on warfarin as an outpatient for Hx DVT.  INR therapeutic today at 2.11.  CBC is stable.   Goal of Therapy:  INR 2-3 Monitor platelets by anticoagulation protocol: Yes   Plan:  Warfarin 7.5mg  x1 tonight  Daily Protime  Celedonio MiyamotoJeremy Remon Quinto, PharmD, [facility]BCPS Clinical Pharmacist Pager [phone]   01/31/2014 12:56 PM

## 2014-01-31 NOTE — Progress Notes (Signed)
NURSING PROGRESS NOTE  Marcus MuttersCharles O [name] [id number] Transfer Data: 01/31/2014 12:08 PM Attending Provider: Leslye Peerobert S Byrum, MD WGN:FAOZHPCP:[name], Myrene GalasIMRAN P, MD Code Status: Full   Marcus MuttersCharles O [name] is a 58 y.o. male patient transferred from 63M -No acute distress noted.  -No complaints of shortness of breath.  -No complaints of chest pain.   Cardiac Monitoring: Box # 19 in place. Cardiac monitor yields:normal sinus rhythm.  Last Documented Vital Signs: Blood pressure 134/78, pulse 69, temperature 98.6 F (37 C), temperature source Oral, resp. rate 14, height 6\' 2"  (1.88 m), weight 62.1 kg (136 lb 14.5 oz), SpO2 100.00%.  IV Fluids:  IV in place, occlusive dsg intact without redness, IV cath upper arm left, condition patent and no redness normal saline lock.   Allergies:  Penicillins  Past Medical History:   has a past medical history of Hypothyroidism; Hyperlipidemia; CVA (cerebrovascular accident); GI bleed; Pulmonary edema; Goiter; Depression; Cocaine abuse; Tobacco abuse; GERD (gastroesophageal reflux disease); Cataract; Insomnia; Chronic pain; HTN (hypertension); Neuromuscular disorder; Occlusion of carotid stent; Pneumonia; Diabetes mellitus; DKA (diabetic ketoacidosis); History of blood transfusion; On home oxygen therapy; Hemodialysis patient; CHF (congestive heart failure); DVT (deep venous thrombosis); ESRD (end stage renal disease) on dialysis; and COPD (chronic obstructive pulmonary disease).  Past Surgical History:   has past surgical history that includes AV fistula placement (03/25/2008); AV fistula placement (04/23/2012); Arteriovenous graft placement (Left); Carotid stent insertion (Right); dyalisis catheter; Eye surgery (Bilateral); Thyroid surgery (01/28/2013); Lymph node biopsy (Right, 01/28/2013); Mass biopsy (N/A, 01/28/2013); Ligation arteriovenous gortex graft (Right, 02/27/2013); Ligation arteriovenous gortex graft (Left, 03/03/2013); Insertion of dialysis catheter (Right,  03/03/2013); AV fistula placement (Left, 03/14/2013); Arteriovenous goretex graft removal (Left, 04/16/2013); Patch angioplasty (Left, 04/16/2013); Colonoscopy (09/16/13); Removal of a dialysis catheter (Right, 09/18/2013); Insertion of dialysis catheter (Left, 09/18/2013); and AV fistula placement (Right, 12/30/2013).  Social History:   reports that he has been smoking Cigarettes.  He has been smoking about 0.25 packs per day. He has never used smokeless tobacco. He reports that he does not drink alcohol or use illicit drugs.  Skin: dry flaky   Patient/family able to verbalize understanding of risk associated with falls and verbalized understanding to call for assistance before getting out of bed. Call light within reach. Patient/family able to voice and demonstrate understanding of unit orientation instructions.    Will continue to evaluate and treat per MD orders.

## 2014-01-31 NOTE — Progress Notes (Signed)
Hypoglycemic Event  CBG: 62  Treatment: D50 IV 25 mL  Symptoms: None  Follow-up CBG: Time:0042 CBG Result:113  Possible Reasons for Event: Inadequate meal intake  Comments/MD notified:Deterdine    Marcus [name]  Remember to initiate Hypoglycemia Order Set & complete

## 2014-01-31 NOTE — Progress Notes (Signed)
PULMONARY / CRITICAL CARE MEDICINE   Name: Marcus [name] MRN: [id number] DOB: 12-Sep-1955    ADMISSION DATE:  01/30/2014   REFERRING MD :  [facility]  CHIEF COMPLAINT:  DKA  STUDIES:    SIGNIFICANT EVENTS: 10/16 [facility]   HISTORY OF PRESENT ILLNESS:   Marcus [name] is a 58 yo AAM with IDDM, ESRD, DVT on coumadin, hypothyroidism, depression, HTN, chronic pain and FTT who [facility] and AMS. He was at dialysis 10/16 , found to have a glucose >11000 and [facility]. He was started on insulin drip via his Lt I J tunneled HD cath and [facility].   SUBJECTIVE/ OVERNIGHT:  Feeling better but still "rough".  Had HD overnight. Off insulin gtt.  Febrile this am (101)  VITAL SIGNS: Temp:  [97.2 F (36.2 C)-101 F (38.3 C)] 101 F (38.3 C) (10/17 0800) Pulse Rate:  [62-75] 74 (10/17 0800) Resp:  [11-31] 16 (10/17 0800) BP: (110-162)/(54-84) 134/74 mmHg (10/17 0800) SpO2:  [94 %-100 %] 94 % (10/17 0800) Weight:  [136 lb 14.5 oz (62.1 kg)-144 lb 6.4 oz (65.5 kg)] 144 lb 2.9 oz (65.4 kg) (10/16 2127) HEMODYNAMICS:   VENTILATOR SETTINGS:   INTAKE / OUTPUT:  Intake/Output Summary (Last 24 hours) at 01/31/14 1011 Last data filed at 01/31/14 0806  Gross per 24 hour  Intake 215.38 ml  Output      0 ml  Net 215.38 ml    PHYSICAL EXAMINATION: General:  Frail wasted AAM, NAD Neuro: awake, alert, appropriate, flat affect  HEENT: No JVD/LAN, left IJ hd cath Cardiovascular:  HSR RRR Lungs:  resps even non labored on RA, Mild rhonchi, decreased in bases Abdomen:  Soft +bs Musculoskeletal:  intact Skin:  Dry. Warm. Poor skin tugor  LABS:  CBC  Recent Labs Lab 01/30/14 1410 01/31/14 0239  WBC 6.1 6.8  HGB 8.1* 8.7*  HCT 24.7* 25.9*  PLT 219 234   Coag's  Recent Labs Lab 01/30/14 1410 01/31/14 0239  INR 1.76* 2.11*   BMET  Recent Labs Lab 01/30/14 1410 01/30/14 2231  01/31/14 0239  NA 122* 133* 135*  K 3.7 3.6* 3.8  CL 84* 95* 96  CO2 19 28 27   BUN 43* 11 12  CREATININE 4.94* 1.83* 2.26*  GLUCOSE 781* 95 98   Electrolytes  Recent Labs Lab 01/30/14 1410 01/30/14 2231 01/31/14 0239  CALCIUM 8.0* 7.8* 7.8*  MG 1.9  --  1.8  PHOS 2.2*  --  1.2*   Sepsis Markers  Recent Labs Lab 01/30/14 1400 01/30/14 1410 01/31/14 0240  LATICACIDVEN 2.4*  --   --   PROCALCITON  --  0.48 0.56   ABG No results found for this basename: PHART, PCO2ART, PO2ART,  in the last 168 hours Liver Enzymes  Recent Labs Lab 01/30/14 1410  AST 20  ALT 5  ALKPHOS 91  BILITOT 0.3  ALBUMIN 2.2*   Cardiac Enzymes  Recent Labs Lab 01/30/14 1410 01/30/14 2231 01/31/14 0239  TROPONINI <0.30 <0.30 <0.30   Glucose  Recent Labs Lab 01/30/14 1921 01/30/14 2157 01/31/14 0016 01/31/14 0042 01/31/14 0152 01/31/14 0417  GLUCAP 120* 99 62* 113* 101* 107*    Imaging Dg Chest Port 1 View  01/30/2014   CLINICAL DATA:  Diabetic ketoacidosis, altered mental status. Dialysis earlier today. Evaluate for infiltrates.  EXAM: PORTABLE CHEST - 1 VIEW  COMPARISON:  01/30/2014  FINDINGS: Left dialysis  catheter remains in place, unchanged. Heart is upper limits normal in size. No confluent airspace opacities or effusions. No edema.  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 No acute bony abnormality.  IMPRESSION: No acute cardiopulmonary disease.  No change since earlier today.   Electronically Signed   By: Charlett NoseKevin  Dover M.D.   On: 01/30/2014 13:17     ASSESSMENT / PLAN:  PULMONARY  A: No acute issue P:   May be component of aspiration  Aggressive pulm hygiene, mobilize   CARDIOVASCULAR CVL L IJ tunnled hd cath>> A:  HTN  P:  Keep on home meds  RENAL A:  ESRD  m w f P:   Renal following  F/u chem  ?if he will be able to wait until Monday for next HD   GASTROINTESTINAL A:   GI protection P:   PPI Adv diet   HEMATOLOGIC A:    Chronic coumadin for dvt P:  Coumadin per pharmacy   INFECTIOUS A:  Fever - no leukocytosis, no obvious source CXR Clear  P:   BCx2 10/16>> UC 10/16  F/u cultures  Check pct   ENDOCRINE A:   DKA -- ?true DKA  Hypothyroid P:   SSI Cont synthroid  DM coordinator consult  Watch for need to resume home lantus -start 5units tonite then bid 10/18 NEUROLOGIC A:   Decreased LOC - resolved.  P:   Cont home prozac    Family updated:  No family at bedside     TODAY'S SUMMARY:  AMS resolved.  ?True DKA, gap closed now, sugars <100.  Tx tele, will ask [facility] to assume care 10/18 (please call us back if he is ready for d/c 10/18).  Xray clear. No overt signs of sepsis or infection.   I have personally obtained a history, examined the patient, evaluated laboratory and imaging results, formulated the assessment and plan and placed orders. CRITICAL CARE: The patient is critically ill with multiple organ systems failure and requires high complexity decision making for assessment and support, frequent evaluation and titration of therapies, application of advanced monitoring technologies and extensive interpretation of multiple databases. Critical Care Time devoted to patient care services described in this note is 30 minutes.    Dorcas Carrowatrick WrightMD Beeper  [phone]  Cell  [phone]  If no response or cell goes to voicemail, call beeper [phone] 11:36 AM 01/31/2014

## 2014-01-31 NOTE — Progress Notes (Signed)
Subjective:  Had HD last night- refused to use thigh AVG- not much volume removal- feels better today but "rough" Objective Vital signs in last 24 hours: Filed Vitals:   01/31/14 0200 01/31/14 0300 01/31/14 0400 01/31/14 0415  BP: 127/54 137/66 132/62   Pulse: 68 71 74   Temp:    98.6 F (37 C)  TempSrc:    Oral  Resp:      Height:      Weight:      SpO2: 100% 100% 100%    Weight change:   Intake/Output Summary (Last 24 hours) at 01/31/14 0721 Last data filed at 01/31/14 0000  Gross per 24 hour  Intake 182.38 ml  Output      0 ml  Net 182.38 ml   [facility]  HD Bath 2 K, 2.25 calc, Dialyzer 180, Heparin yes. Access PC but has right thigh AVG placed 9/15- will try to use this admit.    Assessment/Plan: 58 year old BM with many medical issues including ESRD presents with DKA  1 DKA- patient does have a history of this. S/P HD last night has helped-  2 [facility] but AVG in thigh is a month old and patent so is ready- he refused use of it yest 3 Hypertension: BP not an issue- on norvasc 10 and clonidine 0.1and metoprolol- may not need all of these meds ?  he appeared slightly volume overloaded by appearance yest- better this AM 4. Anemia of ESRD: hgb has been low- on aranesp 120 which is a pretty big dose, will continue here  5. Metabolic Bone Disease: numbers have been good on his current regimen of calcitriol 0.5 three times a week and a combo of phoslo and renvela for binding. Will not resume binders due to low phos 6. Dispo- most likely move to floor bed today- possibly home over weekend.  Will check labs in AM to make sure can wait til Monday for his next HD    Herny Scurlock A    Labs: Basic Metabolic Panel:  Recent Labs Lab 01/30/14 1410 01/30/14 2231 01/31/14 0239  NA 122* 133* 135*  K 3.7 3.6* 3.8  CL 84* 95* 96  CO2 19 28 27   GLUCOSE 781* 95 98  BUN 43* 11 12  CREATININE 4.94* 1.83* 2.26*  CALCIUM 8.0* 7.8*  7.8*  PHOS 2.2*  --  1.2*   Liver Function Tests:  Recent Labs Lab 01/30/14 1410  AST 20  ALT 5  ALKPHOS 91  BILITOT 0.3  PROT 8.8*  ALBUMIN 2.2*   No results found for this basename: LIPASE, AMYLASE,  in the last 168 hours No results found for this basename: AMMONIA,  in the last 168 hours CBC:  Recent Labs Lab 01/30/14 1410 01/31/14 0239  WBC 6.1 6.8  NEUTROABS 3.8  --   HGB 8.1* 8.7*  HCT 24.7* 25.9*  MCV 86.4 84.1  PLT 219 234   Cardiac Enzymes:  Recent Labs Lab 01/30/14 1410 01/30/14 2231 01/31/14 0239  TROPONINI <0.30 <0.30 <0.30   CBG:  Recent Labs Lab 01/30/14 2157 01/31/14 0016 01/31/14 0042 01/31/14 0152 01/31/14 0417  GLUCAP 99 62* 113* 101* 107*    Iron Studies: No results found for this basename: IRON, TIBC, TRANSFERRIN, FERRITIN,  in the last 72 hours Studies/Results: Dg Chest Port 1 View  01/30/2014   CLINICAL DATA:  Diabetic ketoacidosis, altered mental status. Dialysis earlier today. Evaluate for infiltrates.  EXAM: PORTABLE  CHEST - 1 VIEW  COMPARISON:  01/30/2014  FINDINGS: Left dialysis catheter remains in place, unchanged. Heart is upper limits normal in size. No confluent airspace opacities or effusions. No edema.  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 No acute bony abnormality.  IMPRESSION: No acute cardiopulmonary disease.  No change since earlier today.   Electronically Signed   By: Charlett NoseKevin  Dover M.D.   On: 01/30/2014 13:17   Medications: Infusions:    Scheduled Medications: . amLODipine  10 mg Oral QHS  . antiseptic oral rinse  7 mL Mouth Rinse q12n4p  . chlorhexidine  15 mL Mouth Rinse BID  . cloNIDine  0.1 mg Oral TID  . docusate sodium  100 mg Oral BID  . FLUoxetine  5 mg Oral Daily  . insulin aspart  0-9 Units Subcutaneous 6 times per day  . levothyroxine  250 mcg Oral QAC breakfast  . metoprolol  75 mg Oral BID  . pantoprazole  40 mg Oral Daily  . pravastatin  10 mg Oral QHS  . sodium  chloride  3 mL Intravenous Q12H  . Warfarin - Pharmacist Dosing Inpatient   Does not apply q1800    have reviewed scheduled and prn medications.  Physical Exam: General: more alert, NAD Heart: RRR Lungs: mostly clear Abdomen: soft, non tender Extremities:  Min edema Dialysis Access: chest PC also patent right thigh AVG    01/31/2014,7:21 AM  LOS: 1 day

## 2014-02-01 DIAGNOSIS — N186 End stage renal disease: Secondary | ICD-10-CM

## 2014-02-01 DIAGNOSIS — T827XXD Infection and inflammatory reaction due to other cardiac and vascular devices, implants and grafts, subsequent encounter: Secondary | ICD-10-CM

## 2014-02-01 DIAGNOSIS — E43 Unspecified severe protein-calorie malnutrition: Secondary | ICD-10-CM

## 2014-02-01 DIAGNOSIS — Z992 Dependence on renal dialysis: Secondary | ICD-10-CM

## 2014-02-01 DIAGNOSIS — E1021 Type 1 diabetes mellitus with diabetic nephropathy: Secondary | ICD-10-CM

## 2014-02-01 DIAGNOSIS — I10 Essential (primary) hypertension: Secondary | ICD-10-CM

## 2014-02-01 LAB — PROTIME-INR
INR: 2.14 — AB (ref 0.00–1.49)
Prothrombin Time: 24.1 seconds — ABNORMAL HIGH (ref 11.6–15.2)

## 2014-02-01 LAB — RENAL FUNCTION PANEL
Albumin: 2.2 g/dL — ABNORMAL LOW (ref 3.5–5.2)
Anion gap: 12 (ref 5–15)
BUN: 17 mg/dL (ref 6–23)
CHLORIDE: 94 meq/L — AB (ref 96–112)
CO2: 25 meq/L (ref 19–32)
Calcium: 8.1 mg/dL — ABNORMAL LOW (ref 8.4–10.5)
Creatinine, Ser: 3.95 mg/dL — ABNORMAL HIGH (ref 0.50–1.35)
GFR, EST AFRICAN AMERICAN: 18 mL/min — AB (ref >90)
GFR, EST NON AFRICAN AMERICAN: 15 mL/min — AB (ref >90)
GLUCOSE: 172 mg/dL — AB (ref 70–99)
POTASSIUM: 3.8 meq/L (ref 3.7–5.3)
Phosphorus: 2 mg/dL — ABNORMAL LOW (ref 2.3–4.6)
SODIUM: 131 meq/L — AB (ref 137–147)

## 2014-02-01 LAB — GLUCOSE, CAPILLARY
Glucose-Capillary: 143 mg/dL — ABNORMAL HIGH (ref 70–99)
Glucose-Capillary: 115 mg/dL — ABNORMAL HIGH (ref 70–99)
Glucose-Capillary: 61 mg/dL — ABNORMAL LOW (ref 70–99)
Glucose-Capillary: 84 mg/dL (ref 70–99)
Glucose-Capillary: 111 mg/dL — ABNORMAL HIGH (ref 70–99)

## 2014-02-01 LAB — CBC
HCT: 26.6 % — ABNORMAL LOW (ref 39.0–52.0)
HEMOGLOBIN: 8.6 g/dL — AB (ref 13.0–17.0)
MCH: 28.4 pg (ref 26.0–34.0)
MCHC: 32.3 g/dL (ref 30.0–36.0)
MCV: 87.8 fL (ref 78.0–100.0)
PLATELETS: 221 10*3/uL (ref 150–400)
RBC: 3.03 MIL/uL — ABNORMAL LOW (ref 4.22–5.81)
RDW: 16.5 % — ABNORMAL HIGH (ref 11.5–15.5)
WBC: 5.6 10*3/uL (ref 4.0–10.5)

## 2014-02-01 MED ORDER — WARFARIN SODIUM 7.5 MG PO TABS
7.5000 mg | ORAL_TABLET | Freq: Once | ORAL | Status: AC
  Administered 2014-02-01: 7.5 mg via ORAL
  Filled 2014-02-01: qty 1

## 2014-02-01 NOTE — Progress Notes (Signed)
[facility] PROGRESS NOTE  Marcus [name] WUJ:[id number] DOB: 30-Jan-1956 DOA: 01/30/2014 PCP: Galvin ProfferHAGUE, [name], MD  Assessment/Plan: 1. Diabetic ketoacidosis -Patient reporting requiring assistance with medications at home. He states that due to the lack of assistance he has not been able to receive his insulin.  -Anion gap closing, blood sugars controlled -He is on insulin glargine 5 units subcutaneous twice a day with sliding scale coverage. -Social work consult to assist with disposition planning  2.  End-stage renal disease -Patient undergoing hemodialysis on Mondays Wednesdays and Fridays -Dr. Kathrene BongoGoldsborough of nephrology following  3.  History of deep venous thrombosis -Patient anticoagulated with warfarin therapy, pharmacy has been consulted for dosing -A.m. labs showing therapeutic INR of 2.14  4.  Hypertension -Patient's blood pressure stable this morning -Continue amlodipine 10 mg by mouth daily, metoprolol 75 mg twice a day and clonidine 0.1 mg by mouth 3 times a day  5.  Dyslipidemia -Continue pravastatin 10 mg by mouth daily  Code Status: Full code Family Communication:  Disposition Plan: Patient reporting difficulties getting assistance at home particularly with his insulin, as family and a "nurse" are not always present. Social work consult to assist with dispo. Physical therapy/occupational therapy consultation placed   Consultants:  Pulmonary critical care medicine  Nephrology  Procedures:  Hemodialysis   HPI/Subjective: Patient is a 58 year old gentleman with a past medical history of insulin-dependent diabetes mellitus, end-stage renal disease, admitted to the pulmonary critical care service on 01/30/2014, presenting as a transfer from [facility]. Patient found to be in diabetic ketoacidosis, presenting with a gap of 19 with a blood sugar of 781, and started on IV insulin. Patient's Closing, transferred to telemetry on  01/31/2014.  Objective: Filed Vitals:   02/01/14 0531  BP: 130/72  Pulse: 70  Temp: 98.1 F (36.7 C)  Resp: 14    Intake/Output Summary (Last 24 hours) at 02/01/14 1306 Last data filed at 02/01/14 1126  Gross per 24 hour  Intake   1210 ml  Output      0 ml  Net   1210 ml   Filed Weights   01/30/14 2127 01/31/14 1130 02/01/14 0531  Weight: 65.4 kg (144 lb 2.9 oz) 62.1 kg (136 lb 14.5 oz) 61.372 kg (135 lb 4.8 oz)    Exam:   General:  Chronically ill-appearing, appearing older than stated age  Cardiovascular: Regular rate rhythm normal S1-S2  Respiratory: Normal respiratory effort, few crackles to bases  Abdomen: Soft nontender nondistended  Musculoskeletal: Pedal edema present  Data Reviewed: Basic Metabolic Panel:  Recent Labs Lab 01/30/14 1410 01/30/14 2231 01/31/14 0239 02/01/14 0600  NA 122* 133* 135* 131*  K 3.7 3.6* 3.8 3.8  CL 84* 95* 96 94*  CO2 19 28 27 25   GLUCOSE 781* 95 98 172*  BUN 43* 11 12 17   CREATININE 4.94* 1.83* 2.26* 3.95*  CALCIUM 8.0* 7.8* 7.8* 8.1*  MG 1.9  --  1.8  --   PHOS 2.2*  --  1.2* 2.0*   Liver Function Tests:  Recent Labs Lab 01/30/14 1410 02/01/14 0600  AST 20  --   ALT 5  --   ALKPHOS 91  --   BILITOT 0.3  --   PROT 8.8*  --   ALBUMIN 2.2* 2.2*   No results found for this basename: LIPASE, AMYLASE,  in the last 168 hours No results found for this basename: AMMONIA,  in the last 168 hours CBC:  Recent Labs Lab 01/30/14 1410 01/31/14 0239  02/01/14 0600  WBC 6.1 6.8 5.6  NEUTROABS 3.8  --   --   HGB 8.1* 8.7* 8.6*  HCT 24.7* 25.9* 26.6*  MCV 86.4 84.1 87.8  PLT 219 234 221   Cardiac Enzymes:  Recent Labs Lab 01/30/14 1410 01/30/14 2231 01/31/14 0239  TROPONINI <0.30 <0.30 <0.30   BNP (last 3 results) No results found for this basename: PROBNP,  in the last 8760 hours CBG:  Recent Labs Lab 01/31/14 1153 01/31/14 1702 01/31/14 2122 02/01/14 0758 02/01/14 1214  GLUCAP 170* 128* 181*  143* 115*    Recent Results (from the past 240 hour(s))  MRSA PCR SCREENING     Status: None   Collection Time    01/30/14 11:45 AM      Result Value Ref Range Status   MRSA by PCR NEGATIVE  NEGATIVE Final   Comment:            The GeneXpert MRSA Assay (FDA     approved for NASAL specimens     only), is one component of a     comprehensive MRSA colonization     surveillance program. It is not     intended to diagnose MRSA     infection nor to guide or     monitor treatment for     MRSA infections.  CULTURE, BLOOD (ROUTINE X 2)     Status: None   Collection Time    01/30/14  2:10 PM      Result Value Ref Range Status   Specimen Description BLOOD RIGHT HAND   Final   Special Requests BOTTLES DRAWN AEROBIC ONLY 5CC   Final   Culture  Setup Time     Final   Value: 01/30/2014 20:42     Performed at [facility]   Culture     Final   Value:        BLOOD CULTURE RECEIVED NO GROWTH TO DATE CULTURE WILL BE HELD FOR 5 DAYS BEFORE ISSUING A FINAL NEGATIVE REPORT     Performed at [facility]   Report Status PENDING   Incomplete  CULTURE, BLOOD (ROUTINE X 2)     Status: None   Collection Time    01/30/14  2:30 PM      Result Value Ref Range Status   Specimen Description BLOOD RIGHT HAND   Final   Special Requests BOTTLES DRAWN AEROBIC ONLY 3CC   Final   Culture  Setup Time     Final   Value: 01/30/2014 20:39     Performed at [facility]   Culture     Final   Value:        BLOOD CULTURE RECEIVED NO GROWTH TO DATE CULTURE WILL BE HELD FOR 5 DAYS BEFORE ISSUING A FINAL NEGATIVE REPORT     Performed at [facility]   Report Status PENDING   Incomplete     Studies: Dg Chest Portable 1 View  01/31/2014   CLINICAL DATA:  Aspiration pneumonia subsequent evaluation  EXAM: PORTABLE CHEST - 1 VIEW  COMPARISON:  01/30/2014  FINDINGS: Left central line and right vascular stent stable. Stable mild cardiac enlargement. Vascular pattern normal. No consolidation  or infiltrate. Blunting bilateral costophrenic angle suggesting very small effusions, stable.  IMPRESSION: No change from prior study.  No acute findings.   Electronically Signed   By: Esperanza Heir M.D.   On: 01/31/2014 22:26    Scheduled Meds: . amLODipine  10 mg Oral  QHS  . [START ON 02/02/2014] calcitRIOL  0.25 mcg Oral Q M,W,F  . cloNIDine  0.1 mg Oral TID  . [START ON 02/02/2014] darbepoetin (ARANESP) injection - DIALYSIS  120 mcg Intravenous Q Mon-HD  . docusate sodium  100 mg Oral BID  . FLUoxetine  5 mg Oral Daily  . insulin aspart  0-9 Units Subcutaneous TID AC & HS  . insulin glargine  5 Units Subcutaneous BID  . levothyroxine  250 mcg Oral QAC breakfast  . metoprolol  75 mg Oral BID  . pantoprazole  40 mg Oral Daily  . pravastatin  10 mg Oral QHS  . sodium chloride  3 mL Intravenous Q12H  . warfarin  7.5 mg Oral ONCE-1800  . Warfarin - Pharmacist Dosing Inpatient   Does not apply q1800   Continuous Infusions:   Principal Problem:   DKA, type 1 Active Problems:   ESRD (end stage renal disease) on dialysis   DM type 1 causing renal disease   Protein-calorie malnutrition, severe    Time spent: 35 min    Jeralyn BennettZAMORA, Evea Sheek  [facility] Pager [phone]. If 7PM-7AM, please contact night-coverage at [email or web address], password [email or web address] 02/01/2014, 1:06 PM  LOS: 2 days

## 2014-02-01 NOTE — Progress Notes (Signed)
ANTICOAGULATION CONSULT NOTE - Initial Consult  Pharmacy Consult for Warfarin Indication: DVT - Hx of   Labs:  Recent Labs  01/30/14 1410 01/30/14 2231 01/31/14 0239 02/01/14 0600  HGB 8.1*  --  8.7* 8.6*  HCT 24.7*  --  25.9* 26.6*  PLT 219  --  234 221  LABPROT 20.7*  --  23.8* 24.1*  INR 1.76*  --  2.11* 2.14*  CREATININE 4.94* 1.83* 2.26* 3.95*  TROPONINI <0.30 <0.30 <0.30  --     Estimated Creatinine Clearance: 17.7 ml/min (by C-G formula based on Cr of 3.95).    Assessment: [age] yom with ESRD admitted with DKA.  He was on warfarin as an outpatient for Hx DVT.  INR therapeutic today at 2.14.  CBC is stable.   Goal of Therapy:  INR 2-3 Monitor platelets by anticoagulation protocol: Yes   Plan:  Warfarin 7.5mg  x1 tonight  Daily Protime  Celedonio MiyamotoJeremy Keera Altidor, PharmD, BCPS Clinical Pharmacist Pager [phone]   02/01/2014 11:59 AM

## 2014-02-01 NOTE — Progress Notes (Signed)
Subjective:  Moved to floor bed- sugar reasonable - VS stable- he says that no one is at home to take care of him- his sister and his " nurse" cannot be located ?? Objective Vital signs in last 24 hours: Filed Vitals:   01/31/14 1430 01/31/14 2117 02/01/14 0011 02/01/14 0531  BP: 143/68 127/68 144/72 130/72  Pulse: 70 75  70  Temp: 98.6 F (37 C) 98.5 F (36.9 C)  98.1 F (36.7 C)  TempSrc: Oral Oral  Oral  Resp: 14 15  14   Height:      Weight:    61.372 kg (135 lb 4.8 oz)  SpO2: 100% 100%  100%   Weight change: 0 kg (0 lb)  Intake/Output Summary (Last 24 hours) at 02/01/14 0807 Last data filed at 02/01/14 0400  Gross per 24 hour  Intake    830 ml  Output      0 ml  Net    830 ml   [facility] MWF HD Bath 2 K, 2.25 calc, Dialyzer 180, Heparin yes. Access PC but has right thigh AVG placed 9/15- will try to use this admit.    Assessment/Plan: 58 year old BM with many medical issues including ESRD presents with DKA  1 DKA- patient does have a history of this. This now appears resolved 2 [facility] but AVG in thigh is a month old and patent so is ready- he refused use of it Friday- will try again tomorrow 3 Hypertension: BP not an issue- on norvasc 10 and clonidine 0.1and metoprolol- may not need all of these meds ?  he appeared slightly volume overloaded by appearance yest- better this AM 4. Anemia of ESRD: hgb has been low- on aranesp 120 which is a pretty big dose, will continue here  5. Metabolic Bone Disease: numbers have been good on his current regimen of calcitriol 0.5 three times a week and a combo of phoslo and renvela for binding. Will not resume binders due to low phos currently  6. Dispo-  possibly home over weekend although there seem to be social issues.  Will write orders for HD here tomorrow- but if he goes home will let his home OP unit know   Jonty Morrical A    Labs: Basic Metabolic Panel:  Recent Labs Lab  01/30/14 1410 01/30/14 2231 01/31/14 0239 02/01/14 0600  NA 122* 133* 135* 131*  K 3.7 3.6* 3.8 3.8  CL 84* 95* 96 94*  CO2 19 28 27 25   GLUCOSE 781* 95 98 172*  BUN 43* 11 12 17   CREATININE 4.94* 1.83* 2.26* 3.95*  CALCIUM 8.0* 7.8* 7.8* 8.1*  PHOS 2.2*  --  1.2* 2.0*   Liver Function Tests:  Recent Labs Lab 01/30/14 1410 02/01/14 0600  AST 20  --   ALT 5  --   ALKPHOS 91  --   BILITOT 0.3  --   PROT 8.8*  --   ALBUMIN 2.2* 2.2*   No results found for this basename: LIPASE, AMYLASE,  in the last 168 hours No results found for this basename: AMMONIA,  in the last 168 hours CBC:  Recent Labs Lab 01/30/14 1410 01/31/14 0239 02/01/14 0600  WBC 6.1 6.8 5.6  NEUTROABS 3.8  --   --   HGB 8.1* 8.7* 8.6*  HCT 24.7* 25.9* 26.6*  MCV 86.4 84.1 87.8  PLT 219 234 221   Cardiac Enzymes:  Recent Labs Lab 01/30/14 1410 01/30/14 2231  01/31/14 0239  TROPONINI <0.30 <0.30 <0.30   CBG:  Recent Labs Lab 01/31/14 0803 01/31/14 1153 01/31/14 1702 01/31/14 2122 02/01/14 0758  GLUCAP 179* 170* 128* 181* 143*    Iron Studies: No results found for this basename: IRON, TIBC, TRANSFERRIN, FERRITIN,  in the last 72 hours Studies/Results: Dg Chest Portable 1 View  01/31/2014   CLINICAL DATA:  Aspiration pneumonia subsequent evaluation  EXAM: PORTABLE CHEST - 1 VIEW  COMPARISON:  01/30/2014  FINDINGS: Left central line and right vascular stent stable. Stable mild cardiac enlargement. Vascular pattern normal. No consolidation or infiltrate. Blunting bilateral costophrenic angle suggesting very small effusions, stable.  IMPRESSION: No change from prior study.  No acute findings.   Electronically Signed   By: Esperanza Heiraymond  Rubner M.D.   On: 01/31/2014 22:26   Dg Chest Port 1 View  01/30/2014   CLINICAL DATA:  Diabetic ketoacidosis, altered mental status. Dialysis earlier today. Evaluate for infiltrates.  EXAM: PORTABLE CHEST - 1 VIEW  COMPARISON:  01/30/2014  FINDINGS: Left dialysis  catheter remains in place, unchanged. Heart is upper limits normal in size. No confluent airspace opacities or effusions. No edema.  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 No acute bony abnormality.  IMPRESSION: No acute cardiopulmonary disease.  No change since earlier today.   Electronically Signed   By: Charlett NoseKevin  Dover M.D.   On: 01/30/2014 13:17   Medications: Infusions:    Scheduled Medications: . amLODipine  10 mg Oral QHS  . [START ON 02/02/2014] calcitRIOL  0.25 mcg Oral Q M,W,F  . cloNIDine  0.1 mg Oral TID  . [START ON 02/02/2014] darbepoetin (ARANESP) injection - DIALYSIS  120 mcg Intravenous Q Mon-HD  . docusate sodium  100 mg Oral BID  . FLUoxetine  5 mg Oral Daily  . insulin aspart  0-9 Units Subcutaneous TID AC & HS  . insulin glargine  5 Units Subcutaneous BID  . levothyroxine  250 mcg Oral QAC breakfast  . metoprolol  75 mg Oral BID  . pantoprazole  40 mg Oral Daily  . pravastatin  10 mg Oral QHS  . sodium chloride  3 mL Intravenous Q12H  . Warfarin - Pharmacist Dosing Inpatient   Does not apply q1800    have reviewed scheduled and prn medications.  Physical Exam: General: more alert, NAD Heart: RRR Lungs: mostly clear Abdomen: soft, non tender Extremities:  Min edema Dialysis Access: chest PC also patent right thigh AVG    02/01/2014,8:07 AM  LOS: 2 days

## 2014-02-01 NOTE — Progress Notes (Signed)
Hypoglycemic Event  CBG: 61 Treatment: 15 GM carbohydrate snack  Symptoms: None  Follow-up CBG: Time: 1827 CBG Result: 84  Possible Reasons for Event: Unknown  Comments/MD notified: Dr. Grace BlightZamora     Danica Camarena, Misty StanleyLisa  Remember to initiate Hypoglycemia Order Set & complete

## 2014-02-02 LAB — BASIC METABOLIC PANEL
ANION GAP: 14 (ref 5–15)
BUN: 22 mg/dL (ref 6–23)
CHLORIDE: 95 meq/L — AB (ref 96–112)
CO2: 23 mEq/L (ref 19–32)
CREATININE: 5.14 mg/dL — AB (ref 0.50–1.35)
Calcium: 8 mg/dL — ABNORMAL LOW (ref 8.4–10.5)
GFR calc Af Amer: 13 mL/min — ABNORMAL LOW (ref >90)
GFR calc non Af Amer: 11 mL/min — ABNORMAL LOW (ref >90)
Glucose, Bld: 135 mg/dL — ABNORMAL HIGH (ref 70–99)
POTASSIUM: 4.2 meq/L (ref 3.7–5.3)
Sodium: 132 mEq/L — ABNORMAL LOW (ref 137–147)

## 2014-02-02 LAB — CBC
HCT: 26.5 % — ABNORMAL LOW (ref 39.0–52.0)
HEMOGLOBIN: 8.3 g/dL — AB (ref 13.0–17.0)
MCH: 28.7 pg (ref 26.0–34.0)
MCHC: 31.3 g/dL (ref 30.0–36.0)
MCV: 91.7 fL (ref 78.0–100.0)
Platelets: 198 10*3/uL (ref 150–400)
RBC: 2.89 MIL/uL — AB (ref 4.22–5.81)
RDW: 16.8 % — ABNORMAL HIGH (ref 11.5–15.5)
WBC: 4.4 10*3/uL (ref 4.0–10.5)

## 2014-02-02 LAB — PROCALCITONIN: Procalcitonin: 0.6 ng/mL

## 2014-02-02 LAB — PROTIME-INR
INR: 2.07 — AB (ref 0.00–1.49)
PROTHROMBIN TIME: 23.4 s — AB (ref 11.6–15.2)

## 2014-02-02 LAB — GLUCOSE, CAPILLARY
GLUCOSE-CAPILLARY: 145 mg/dL — AB (ref 70–99)
Glucose-Capillary: 94 mg/dL (ref 70–99)

## 2014-02-02 MED ORDER — DOCUSATE SODIUM 100 MG PO CAPS: 100.0000 mg | ORAL_CAPSULE | Freq: Two times a day (BID) | ORAL | Status: AC

## 2014-02-02 MED ORDER — AMLODIPINE BESYLATE 10 MG PO TABS: 10.0000 mg | ORAL_TABLET | Freq: Every day | ORAL | Status: AC

## 2014-02-02 MED ORDER — METOPROLOL TARTRATE 50 MG PO TABS
50.0000 mg | ORAL_TABLET | Freq: Two times a day (BID) | ORAL | Status: DC
  Filled 2014-02-02 (×2): qty 1

## 2014-02-02 MED ORDER — CALCITRIOL 0.25 MCG PO CAPS: 0.2500 ug | ORAL_CAPSULE | ORAL | Status: DC

## 2014-02-02 MED ORDER — DARBEPOETIN ALFA-POLYSORBATE 150 MCG/0.3ML IJ SOLN
INTRAMUSCULAR | Status: AC
  Filled 2014-02-02: qty 0.3

## 2014-02-02 MED ORDER — HEPARIN SODIUM (PORCINE) 1000 UNIT/ML DIALYSIS
20.0000 [IU]/kg | INTRAMUSCULAR | Status: DC | PRN
  Filled 2014-02-02: qty 2

## 2014-02-02 MED ORDER — DARBEPOETIN ALFA-POLYSORBATE 150 MCG/0.3ML IJ SOLN: 120.0000 ug | INTRAMUSCULAR | Status: DC

## 2014-02-02 MED ORDER — DARBEPOETIN ALFA-POLYSORBATE 60 MCG/0.3ML IJ SOLN
INTRAMUSCULAR | Status: AC
  Filled 2014-02-02: qty 0.6

## 2014-02-02 MED ORDER — METOPROLOL TARTRATE 50 MG PO TABS: 50.0000 mg | ORAL_TABLET | Freq: Two times a day (BID) | ORAL | Status: DC

## 2014-02-02 MED ORDER — LEVOTHYROXINE SODIUM 125 MCG PO TABS
250.0000 ug | ORAL_TABLET | Freq: Every day | ORAL | Status: DC
Start: 2014-02-02 — End: 2014-03-28

## 2014-02-02 MED ORDER — INSULIN ASPART 100 UNIT/ML ~~LOC~~ SOLN: 0.0000 [IU] | Freq: Three times a day (TID) | SUBCUTANEOUS | Status: DC

## 2014-02-02 MED ORDER — INSULIN GLARGINE 100 UNIT/ML ~~LOC~~ SOLN: 5.0000 [IU] | Freq: Two times a day (BID) | SUBCUTANEOUS | Status: DC

## 2014-02-02 MED ORDER — FLUOXETINE HCL 20 MG/5ML PO SOLN: 5.0000 mg | Freq: Every day | ORAL | Status: DC

## 2014-02-02 MED ORDER — LORAZEPAM 2 MG/ML IJ SOLN
1.0000 mg | Freq: Once | INTRAMUSCULAR | Status: AC
  Administered 2014-02-02: 1 mg via INTRAVENOUS

## 2014-02-02 MED ORDER — LORAZEPAM 2 MG/ML IJ SOLN
INTRAMUSCULAR | Status: AC
  Filled 2014-02-02: qty 1

## 2014-02-02 MED ORDER — DARBEPOETIN ALFA-POLYSORBATE 60 MCG/0.3ML IJ SOLN: 60.0000 ug | INTRAMUSCULAR | Status: DC

## 2014-02-02 MED ORDER — PRAVASTATIN SODIUM 10 MG PO TABS: 10.0000 mg | ORAL_TABLET | Freq: Every day | ORAL | Status: AC

## 2014-02-02 NOTE — Clinical Social Work Placement (Signed)
Clinical Social Work Department CLINICAL SOCIAL WORK PLACEMENT NOTE 02/02/2014  Patient:  Marcus MuttersSTALEY,Marcus O  Account Number:  [id number] Admit date:  01/30/2014  Clinical Social Worker:  Lavell LusterJOSEPH BRYANT Donnamarie Shankles, LCSWA  Date/time:  02/02/2014 12:42 PM  Clinical Social Work is seeking post-discharge placement for this patient at the following level of care:   SKILLED NURSING   (*CSW will update this form in Epic as items are completed)   02/02/2014  Patient/family provided with [facility] Department of Clinical Social Work's list of facilities offering this level of care within the geographic area requested by the patient (or if unable, by the patient's family).  02/02/2014  Patient/family informed of their freedom to choose among providers that offer the needed level of care, that participate in Medicare, Medicaid or managed care program needed by the patient, have an available bed and are willing to accept the patient.  02/02/2014  Patient/family informed of [facility]' ownership interest in [facility], as well as of the fact that they are under no obligation to receive care at this facility.  PASARR submitted to EDS on  PASARR number received on   FL2 transmitted to all facilities in geographic area requested by pt/family on  02/02/2014 FL2 transmitted to all facilities within larger geographic area on   Patient informed that his/her managed care company has contracts with or will negotiate with  certain facilities, including the following:     Patient/family informed of bed offers received:   Patient chooses bed at  Physician recommends and patient chooses bed at    Patient to be transferred to  on   Patient to be transferred to facility by  Patient and family notified of transfer on  Name of family member notified:    The following physician request were entered in Epic:   Additional Comments:    Roddie McBryant Susannah Carbin MSW, [place], [place], [phone]

## 2014-02-02 NOTE — Discharge Summary (Signed)
Physician Discharge Summary  Marcus [name]:[id number] DOB: 1955/11/20 DOA: 01/30/2014  PCP: Galvin Proffer, MD  Admit date: 01/30/2014 Discharge date: 02/02/2014  Time spent: 35 minutes  Recommendations for Outpatient Follow-up:  1. Please follow up on PT/INR checks on Tues and Thurs, had a INR of 2.07 on 10/19/20150 2. Accu-Cheks q. a.c. each bedtime 3. Patient undergoing hemodialysis on Mondays Wednesdays and Fridays  Discharge Diagnoses:  Principal Problem:   DKA, type 1 Active Problems:   ESRD (end stage renal disease) on dialysis   DM type 1 causing renal disease   Protein-calorie malnutrition, severe   Discharge Condition: Stable/improved  Diet recommendation: [place]earth modified/renal diet  Filed Weights   01/31/14 1130 02/01/14 0531 02/02/14 0434  Weight: 62.1 kg (136 lb 14.5 oz) 61.372 kg (135 lb 4.8 oz) 61.8 kg (136 lb 3.9 oz)    History of present illness:  [name] is a 58 yo AAM(appears older) with known IDDM, ESRD, DVT on coumadin, hypothyroidism, depression, HTN, chronic pain and FTT who represents to [facility] from [place] with DKA and AMS. He was at dialysis 10/16 , found to have a glucose >11000 and was transferred to [facility] ED. He was started on insulin drip via his Lt I J tunneled HD cath and transferred to [facility]. PCCM has admitted and will placed him DKA protocol.  Hospital Course:  Patient is a 58 year old gentleman with a past medical history of insulin-dependent diabetes mellitus, end-stage renal disease, admitted to the pulmonary critical care service on 01/30/2014, presenting as a transfer from [facility]. Patient found to be in diabetic ketoacidosis, presenting with a gap of 19 with a blood sugar of 781, and started on IV insulin. Patient's anion gap closing, transferred to telemetry on 01/31/2014. Patient had a complex social situation. It appears that his sister had been involved in his care assisting him with his medications including insulin.  His sister unfortunately recently was jailed and could no longer participate in his care and patient subsequently going into diabetic ketoacidosis as a result. Social work and Sports coach were consulted on this case. Patient unable to administer his medications, as it was felt that discharge would be unsafe. Plan to discharge to skilled nursing facility when bed available.  1. Diabetic ketoacidosis -Patient reporting requiring assistance with medications at home. He states that due to the lack of assistance he has not been able to receive his insulin.  -Anion gap now closed, blood sugars controlled  -He is on insulin glargine 5 units subcutaneous twice a day with sliding scale coverage.  -Patient was evaluated by diabetes coordinator during this hospitalization -Please continue Accu-Cheks q. a.c. each bedtime 2. End-stage renal disease  -Patient undergoing hemodialysis on Mondays Wednesdays and Fridays  -Dr. Kathrene Bongo of nephrology following  3. History of deep venous thrombosis  -Patient anticoagulated with warfarin therapy, pharmacy has been consulted for dosing  -A.m. labs on 02/02/2014 showing therapeutic INR of 2.07 -Continue PT/INR checks on Tuesdays and Thursdays  4. Hypertension  -Patient's blood pressure stable this morning  -Continue amlodipine 10 mg by mouth daily and metoprolol 75 mg twice a day  5. Dyslipidemia  -Continue pravastatin 10 mg by mouth daily    Procedures: Hemodialysis  Consultations:  Pulmonary critical care medicine  Nephrology  Discharge Exam: Filed Vitals:   02/02/14 0533  BP: 125/60  Pulse: 63  Temp: 98 F (36.7 C)  Resp: 16   General: Chronically ill-appearing, appearing older than stated age  Cardiovascular: Regular rate rhythm  normal S1-S2  Respiratory: Normal respiratory effort, few crackles to bases  Abdomen: Soft nontender nondistended  Musculoskeletal: Pedal edema present   Discharge Instructions You were cared for by a  hospitalist during your hospital stay. If you have any questions about your discharge medications or the care you received while you were in the hospital after you are discharged, you can call the unit and asked to speak with the hospitalist on call if the hospitalist that took care of you is not available. Once you are discharged, your primary care physician will handle any further medical issues. Please note that NO REFILLS for any discharge medications will be authorized once you are discharged, as it is imperative that you return to your primary care physician (or establish a relationship with a primary care physician if you do not have one) for your aftercare needs so that they can reassess your need for medications and monitor your lab values.  Discharge Instructions   Call MD for:  difficulty breathing, headache or visual disturbances    Complete by:  As directed      Call MD for:  extreme fatigue    Complete by:  As directed      Call MD for:  hives    Complete by:  As directed      Call MD for:  persistant dizziness or light-headedness    Complete by:  As directed      Call MD for:  persistant nausea and vomiting    Complete by:  As directed      Call MD for:  redness, tenderness, or signs of infection (pain, swelling, redness, odor or green/yellow discharge around incision site)    Complete by:  As directed      Call MD for:  severe uncontrolled pain    Complete by:  As directed      Call MD for:  temperature >100.4    Complete by:  As directed      Diet - low sodium heart healthy    Complete by:  As directed      Increase activity slowly    Complete by:  As directed           Current Discharge Medication List    START taking these medications   Details  calcitRIOL (ROCALTROL) 0.25 MCG capsule Take 1 capsule (0.25 mcg total) by mouth every Monday, Wednesday, and Friday. Qty: 30 capsule, Refills: 1    FLUoxetine (PROZAC) 20 MG/5ML solution Take 1.3 mLs (5.2 mg total) by  mouth daily. Qty: 120 mL, Refills: 3    insulin aspart (NOVOLOG) 100 UNIT/ML injection Inject 0-9 Units into the skin 4 (four) times daily -  before meals and at bedtime. Qty: 10 mL, Refills: 11      CONTINUE these medications which have CHANGED   Details  amLODipine (NORVASC) 10 MG tablet Take 1 tablet (10 mg total) by mouth at bedtime. Qty: 30 tablet, Refills: 2    docusate sodium (COLACE) 100 MG capsule Take 1 capsule (100 mg total) by mouth 2 (two) times daily. Qty: 10 capsule, Refills: 0    insulin glargine (LANTUS) 100 UNIT/ML injection Inject 0.05 mLs (5 Units total) into the skin 2 (two) times daily. Qty: 10 mL, Refills: 11    levothyroxine (SYNTHROID, LEVOTHROID) 125 MCG tablet Take 2 tablets (250 mcg total) by mouth daily before breakfast. Qty: 30 tablet, Refills: 1    metoprolol (LOPRESSOR) 50 MG tablet Take 1 tablet (50 mg total) by  mouth 2 (two) times daily. Qty: 60 tablet, Refills: 1    pravastatin (PRAVACHOL) 10 MG tablet Take 1 tablet (10 mg total) by mouth at bedtime. Qty: 30 tablet, Refills: 1      CONTINUE these medications which have NOT CHANGED   Details  PROAIR HFA 108 (90 BASE) MCG/ACT inhaler Inhale 2 puffs into the lungs every 4 (four) hours as needed for wheezing or shortness of breath.     !! warfarin (COUMADIN) 1 MG tablet Take 1 mg by mouth daily.    !! warfarin (COUMADIN) 7.5 MG tablet Take 7.5 mg by mouth daily.     !! - Potential duplicate medications found. Please discuss with provider.     Allergies  Allergen Reactions  . Penicillins Other (See Comments)    Unknown reaction   Follow-up Information   Follow up with [name], Myrene GalasIMRAN P, MD In 1 week.   Specialty:  Internal Medicine   Contact information:   [address] [place] [address] [phone]        The results of significant diagnostics from this hospitalization (including imaging, microbiology, ancillary and laboratory) are listed below for reference.     Significant Diagnostic Studies: Dg Chest Portable 1 View  01/31/2014   CLINICAL DATA:  Aspiration pneumonia subsequent evaluation  EXAM: PORTABLE CHEST - 1 VIEW  COMPARISON:  01/30/2014  FINDINGS: Left central line and right vascular stent stable. Stable mild cardiac enlargement. Vascular pattern normal. No consolidation or infiltrate. Blunting bilateral costophrenic angle suggesting very small effusions, stable.  IMPRESSION: No change from prior study.  No acute findings.   Electronically Signed   By: Esperanza Heiraymond  Rubner M.D.   On: 01/31/2014 22:26   Dg Chest Port 1 View  01/30/2014   CLINICAL DATA:  Diabetic ketoacidosis, altered mental status. Dialysis earlier today. Evaluate for infiltrates.  EXAM: PORTABLE CHEST - 1 VIEW  COMPARISON:  01/30/2014  FINDINGS: Left dialysis catheter remains in place, unchanged. Heart is upper limits normal in size. No confluent airspace opacities or effusions. No edema.  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 No acute bony abnormality.  IMPRESSION: No acute cardiopulmonary disease.  No change since earlier today.   Electronically Signed   By: Charlett NoseKevin  Dover M.D.   On: 01/30/2014 13:17    Microbiology: Recent Results (from the past 240 hour(s))  MRSA PCR SCREENING     Status: None   Collection Time    01/30/14 11:45 AM      Result Value Ref Range Status   MRSA by PCR NEGATIVE  NEGATIVE Final   Comment:            The GeneXpert MRSA Assay (FDA     approved for NASAL specimens     only), is one component of a     comprehensive MRSA colonization     surveillance program. It is not     intended to diagnose MRSA     infection nor to guide or     monitor treatment for     MRSA infections.  CULTURE, BLOOD (ROUTINE X 2)     Status: None   Collection Time    01/30/14  2:10 PM      Result Value Ref Range Status   Specimen Description BLOOD RIGHT HAND   Final   Special Requests BOTTLES DRAWN AEROBIC ONLY 5CC   Final   Culture  Setup Time      Final   Value: 01/30/2014 20:42  Performed at [facility]     Final   Value:        BLOOD CULTURE RECEIVED NO GROWTH TO DATE CULTURE WILL BE HELD FOR 5 DAYS BEFORE ISSUING A FINAL NEGATIVE REPORT     Performed at [facility]   Report Status PENDING   Incomplete  CULTURE, BLOOD (ROUTINE X 2)     Status: None   Collection Time    01/30/14  2:30 PM      Result Value Ref Range Status   Specimen Description BLOOD RIGHT HAND   Final   Special Requests BOTTLES DRAWN AEROBIC ONLY 3CC   Final   Culture  Setup Time     Final   Value: 01/30/2014 20:39     Performed at [facility]   Culture     Final   Value:        BLOOD CULTURE RECEIVED NO GROWTH TO DATE CULTURE WILL BE HELD FOR 5 DAYS BEFORE ISSUING A FINAL NEGATIVE REPORT     Performed at [facility]   Report Status PENDING   Incomplete     Labs: Basic Metabolic Panel:  Recent Labs Lab 01/30/14 1410 01/30/14 2231 01/31/14 0239 02/01/14 0600 02/02/14 0532  NA 122* 133* 135* 131* 132*  K 3.7 3.6* 3.8 3.8 4.2  CL 84* 95* 96 94* 95*  CO2 19 28 27 25 23   GLUCOSE 781* 95 98 172* 135*  BUN 43* 11 12 17 22   CREATININE 4.94* 1.83* 2.26* 3.95* 5.14*  CALCIUM 8.0* 7.8* 7.8* 8.1* 8.0*  MG 1.9  --  1.8  --   --   PHOS 2.2*  --  1.2* 2.0*  --    Liver Function Tests:  Recent Labs Lab 01/30/14 1410 02/01/14 0600  AST 20  --   ALT 5  --   ALKPHOS 91  --   BILITOT 0.3  --   PROT 8.8*  --   ALBUMIN 2.2* 2.2*   No results found for this basename: LIPASE, AMYLASE,  in the last 168 hours No results found for this basename: AMMONIA,  in the last 168 hours CBC:  Recent Labs Lab 01/30/14 1410 01/31/14 0239 02/01/14 0600 02/02/14 0532  WBC 6.1 6.8 5.6 4.4  NEUTROABS 3.8  --   --   --   HGB 8.1* 8.7* 8.6* 8.3*  HCT 24.7* 25.9* 26.6* 26.5*  MCV 86.4 84.1 87.8 91.7  PLT 219 234 221 198   Cardiac Enzymes:  Recent Labs Lab 01/30/14 1410 01/30/14 2231 01/31/14 0239  TROPONINI  <0.30 <0.30 <0.30   BNP: BNP (last 3 results) No results found for this basename: PROBNP,  in the last 8760 hours CBG:  Recent Labs Lab 02/01/14 1214 02/01/14 1748 02/01/14 1827 02/01/14 2108 02/02/14 0741  GLUCAP 115* 61* 84 111* 94       Signed:  Ariz Terrones  [facility] 02/02/2014, 10:56 AM

## 2014-02-02 NOTE — Progress Notes (Signed)
Patient's HD site has been assessed- no bleeding at this time. RN to call for transport for patient.

## 2014-02-02 NOTE — Progress Notes (Signed)
ANTICOAGULATION CONSULT NOTE - Initial Consult  Pharmacy Consult for Warfarin Indication: DVT - Hx of   Labs:  Recent Labs  01/30/14 1410 01/30/14 2231 01/31/14 0239 02/01/14 0600 02/02/14 0532  HGB 8.1*  --  8.7* 8.6* 8.3*  HCT 24.7*  --  25.9* 26.6* 26.5*  PLT 219  --  234 221 198  LABPROT 20.7*  --  23.8* 24.1* 23.4*  INR 1.76*  --  2.11* 2.14* 2.07*  CREATININE 4.94* 1.83* 2.26* 3.95* 5.14*  TROPONINI <0.30 <0.30 <0.30  --   --     Estimated Creatinine Clearance: 13.7 ml/min (by C-G formula based on Cr of 5.14).    Assessment: [age] yom with ESRD admitted with DKA.  He was on warfarin as an outpatient for Hx DVT.  INR therapeutic today at 2.0.  CBC is stable although trending down. No bleeding noted.  Goal of Therapy:  INR 2-3 Monitor platelets by anticoagulation protocol: Yes   Plan:  Warfarin 7.5mg  x1 tonight  Daily Protime  Sheppard CoilFrank Wilson PharmD., BCPS Clinical Pharmacist Pager [phone] 02/02/2014 12:19 PM

## 2014-02-02 NOTE — Clinical Social Work Note (Signed)
RN to call for ambulance transport once patient has received HD. Numbers for transport listed on DC packet. CSW signing off at this time.  Roddie McBryant Nyrah Demos MSW, [place], [place], [phone]

## 2014-02-02 NOTE — Progress Notes (Signed)
Marcus [name] Progress Note   Subjective: Doesn't remember why he is here, no complaints, good appetite  Filed Vitals:   02/01/14 1446 02/01/14 2109 02/02/14 0434 02/02/14 0533  BP: 127/64 137/64  125/60  Pulse: 66 72  63  Temp: 98.7 F (37.1 C) 97.8 F (36.6 C)  98 F (36.7 C)  TempSrc: Oral Oral  Oral  Resp: 16 14  16   Height:      Weight:   61.8 kg (136 lb 3.9 oz)   SpO2: 100% 100%  100%   Exam: Alert, no distress, calm, chronically ill appearing No jvd Chest clear bilat RRR no MRG Abd soft, NTND Ext tender UE's, edema RUE R thigh AVG patent Neuro is nf, Ox 3       [facility]  HD Bath 2 K, 2.25 calc, Dialyzer 180, Heparin yes. 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 Hold binders due to low phos currently  6 Dispo - per primary, poor social situation ?   Plan- will lower BP meds, try to get vol down to dry wt    Vinson Moselleob Tino Ronan MD  pager [phone]    cell [phone]  02/02/2014, 8:32 AM     Recent Labs Lab 01/30/14 1410  01/31/14 0239 02/01/14 0600 02/02/14 0532  NA 122*  < > 135* 131* 132*  K 3.7  < > 3.8 3.8 4.2  CL 84*  < > 96 94* 95*  CO2 19  < > 27 25 23   GLUCOSE 781*  < > 98 172* 135*  BUN 43*  < > 12 17 22   CREATININE 4.94*  < > 2.26* 3.95* 5.14*  CALCIUM 8.0*  < > 7.8* 8.1* 8.0*  PHOS 2.2*  --  1.2* 2.0*  --   < > = values in this interval not displayed.  Recent Labs Lab 01/30/14 1410 02/01/14 0600  AST 20  --   ALT 5  --   ALKPHOS 91  --   BILITOT 0.3  --   PROT 8.8*  --   ALBUMIN 2.2* 2.2*     Recent Labs Lab 01/30/14 1410 01/31/14 0239 02/01/14 0600 02/02/14 0532  WBC 6.1 6.8 5.6 4.4  NEUTROABS 3.8  --   --   --   HGB 8.1* 8.7* 8.6* 8.3*  HCT 24.7* 25.9* 26.6* 26.5*  MCV 86.4 84.1 87.8 91.7  PLT 219 234 221 198   . amLODipine  10 mg Oral QHS  . calcitRIOL  0.25 mcg Oral Q M,W,F  . cloNIDine  0.1 mg Oral TID  . darbepoetin (ARANESP) injection - DIALYSIS  120 mcg Intravenous Q Mon-HD  . docusate sodium  100 mg Oral BID  . FLUoxetine  5 mg Oral Daily  . insulin aspart  0-9 Units Subcutaneous TID AC & HS  . insulin glargine  5 Units Subcutaneous BID  . levothyroxine  250 mcg Oral QAC breakfast  . metoprolol  75  mg Oral BID  . pantoprazole  40 mg Oral Daily  . pravastatin  10 mg Oral QHS  . sodium chloride  3 mL Intravenous Q12H  . Warfarin - Pharmacist Dosing Inpatient   Does not apply q1800     sodium chloride, albuterol, sodium chloride

## 2014-02-02 NOTE — Evaluation (Signed)
Physical Therapy Evaluation Patient Details Name: Marcus MuttersCharles O [name] MRN: [id number] DOB: [birth year] Today's Date: 02/02/2014   History of Present Illness  Marcus [name] is a 58 yo AAM(appears older) with known IDDM, ESRD, DVT on coumadin, hypothyroidism, depression, HTN, chronic pain and FTT [facility]. He was at dialysis 10/16 , found to have a glucose >11000 and [facility].   Clinical Impression  Patient demonstrates deficits in functional mobility as indicated below. Will need continued skilled PT to address deficits and maximize function. Will see as indicated and progress as tolerated. Recommend HHPT upon acute discharge.     Follow Up Recommendations Home health PT;Supervision for mobility/OOB    Equipment Recommendations  None recommended by PT    Recommendations for Other Services       Precautions / Restrictions Precautions Precautions: Fall      Mobility  Bed Mobility Overal bed mobility: Modified Independent             General bed mobility comments: increased time but no physical assist required to perform  Transfers Overall transfer level: Needs assistance Equipment used: Rolling walker (2 wheeled) Transfers: Sit to/from Stand Sit to Stand: Min guard         General transfer comment: no physical assist required  Ambulation/Gait Ambulation/Gait assistance: Min guard Ambulation Distance (Feet): 110 Feet Assistive device: Rolling walker (2 wheeled) Gait Pattern/deviations: Steppage;Trunk flexed;Drifts right/left;Narrow base of support Gait velocity: decreased Gait velocity interpretation: Below normal speed for age/gender General Gait Details: extreme steppage gait secondary to peripheral neuropathy  Stairs            Wheelchair Mobility    Modified Rankin (Stroke Patients Only)       Balance                                             Pertinent  Vitals/Pain Pain Assessment: No/denies pain    Home Living Family/patient expects to be discharged to:: Private residence Living Arrangements: Other relatives Available Help at Discharge: Family;Available PRN/intermittently;Friend(s) Type of Home: Apartment Home Access: Stairs to enter Entrance Stairs-Rails: Right Entrance Stairs-Number of Steps: 4 Home Layout: One level Home Equipment: Walker - 2 wheels;Shower seat      Prior Function Level of Independence: Independent with assistive device(s)         Comments: pt becomes agitated when being asked questions regarding PLOF      Hand Dominance   Dominant Hand: Right    Extremity/Trunk Assessment               Lower Extremity Assessment: Generalized weakness      Cervical / Trunk Assessment: Kyphotic  Communication   Communication: No difficulties  Cognition Arousal/Alertness: Awake/alert Behavior During Therapy: WFL for tasks assessed/performed Overall Cognitive Status: No family/caregiver present to determine baseline cognitive functioning                      General Comments      Exercises        Assessment/Plan    PT Assessment Patient needs continued PT services  PT Diagnosis Abnormality of gait;Generalized weakness   PT Problem List Decreased strength;Decreased activity tolerance;Decreased balance;Decreased mobility;Impaired sensation  PT Treatment Interventions DME instruction;Gait training;Stair training;Functional mobility training;Therapeutic activities;Therapeutic exercise;Balance training;Patient/family education   PT Goals (Current goals can  be found in the Care Plan section) Acute Rehab PT Goals Patient Stated Goal: to be able to relax PT Goal Formulation: With patient Time For Goal Achievement: 02/16/14 Potential to Achieve Goals: Fair    Frequency Min 3X/week   Barriers to discharge        Co-evaluation               End of Session Equipment Utilized During  Treatment: Gait belt Activity Tolerance: Patient tolerated treatment well Patient left: in chair;with call bell/phone within reach Nurse Communication: Mobility status         Time: [id number] PT Time Calculation (min): 15 min   Charges:   PT Evaluation $Initial PT Evaluation Tier I: 1 Procedure PT Treatments $Gait Training: 8-22 mins   PT G CodesFabio Asa:          Chenise Mulvihill J 02/02/2014, 9:21 AM Charlotte Crumbevon Beatrice Sehgal, PT DPT  [phone]

## 2014-02-02 NOTE — Clinical Social Work Psychosocial (Signed)
Clinical Social Work Department BRIEF PSYCHOSOCIAL ASSESSMENT 02/02/2014  Patient:  Marcus [name], Marcus [name]     Account Number:  [id number]     Admit date:  01/30/2014  Clinical Social Worker:  Lovey Newcomer  Date/Time:  02/02/2014 12:30 PM  Referred by:  Physician  Date Referred:  02/02/2014 Referred for  SNF Placement   Other Referral:   NA   Interview type:  Patient Other interview type:   Patient alert and oriented at time of assessment.    PSYCHOSOCIAL DATA Living Status:  ALONE Admitted from facility:   Level of care:   Primary support name:  Rosann Auerbach Primary support relationship to patient:  SIBLING Degree of support available:   Support is fair.    CURRENT CONCERNS Current Concerns  Post-Acute Placement   Other Concerns:   NA    SOCIAL WORK ASSESSMENT / PLAN CSW met with patient, MD, and RNCM at bedside. Patient does not appear optimistic about the recommendation for SNF placement but understands that this is the safest option for him as his sister is currently in jail and will not be in the home to see after him. Patient states that he would want a facility in [place]. CSW explained SNF search/placement process and answered questions. Patient states that he does not understand how to manage his diabetes and needs assistance with this. Patient understands the benefits of short term SNF placement. CSW will assist as appropriate.   Assessment/plan status:  Psychosocial Support/Ongoing Assessment of Needs Other assessment/ plan:   Complete Fl2, Fax, PASRR   Information/referral to community resources:   CSW contact information and SNF list given.    PATIENT'S/FAMILY'S RESPONSE TO PLAN OF CARE: Patient plans to DC to SNF when stable. CSW will assist as appropriate.       Liz Beach MSW, [place], [place], [phone]

## 2014-02-02 NOTE — Clinical Social Work Placement (Signed)
Clinical Social Work Department CLINICAL SOCIAL WORK PLACEMENT NOTE 02/02/2014  Patient:  Marcus [name],Marcus [name]  Account Number:  [id number] Admit date:  01/30/2014  Clinical Social Worker:  Lavell LusterJOSEPH BRYANT Atalya Dano, LCSWA  Date/time:  02/02/2014 12:42 PM  Clinical Social Work is seeking post-discharge placement for this patient at the following level of care:   SKILLED NURSING   (*CSW will update this form in Epic as items are completed)   02/02/2014  Patient/family provided with [facility] Department of Clinical Social Work's list of facilities offering this level of care within the geographic area requested by the patient (or if unable, by the patient's family).  02/02/2014  Patient/family informed of their freedom to choose among providers that offer the needed level of care, that participate in Medicare, Medicaid or managed care program needed by the patient, have an available bed and are willing to accept the patient.  02/02/2014  Patient/family informed of [facility]' ownership interest in [facility], as well as of the fact that they are under no obligation to receive care at this facility.  PASARR submitted to EDS on  PASARR number received on   FL2 transmitted to all facilities in geographic area requested by pt/family on  02/02/2014 FL2 transmitted to all facilities within larger geographic area on   Patient informed that his/her managed care company has contracts with or will negotiate with  certain facilities, including the following:     Patient/family informed of bed offers received:  02/02/2014 [facility] Physician recommends and patient chooses bed at    [facility] be [facility]  02/02/2014 Patient to be transferred to facility by Ambulance Patient and family notified of transfer on 02/02/2014 Name of family member notified:  [name] (sister)  The following physician request were entered in  Epic:   Additional Comments:   Per MD patient [facility]. RN, patient, patient's family, and facility notified of DC. RN given number for report. DC packet on chart. AMbulance transport requested for patient. CSW signing off.    Roddie McBryant Manuelito Poage MSW, [facility] CampusLCSWA, [place], [phone]

## 2014-02-02 NOTE — Progress Notes (Signed)
RN called and has given [facility].

## 2014-02-02 NOTE — Clinical Social Work Placement (Deleted)
Clinical Social Work Department CLINICAL SOCIAL WORK PLACEMENT NOTE 02/02/2014  Patient:  Marcus [name],Marcus [name]  Account Number:  [id number] Admit date:  01/28/2014  Clinical Social Worker:  Lavell LusterJOSEPH BRYANT Zebbie Ace, LCSWA  Date/time:  01/30/2014 07:10 PM  Clinical Social Work is seeking post-discharge placement for this patient at the following level of care:   SKILLED NURSING   (*CSW will update this form in Epic as items are completed)   01/30/2014  Patient/family provided with [facility] Department of Clinical Social Work's list of facilities offering this level of care within the geographic area requested by the patient (or if unable, by the patient's family).  01/30/2014  Patient/family informed of their freedom to choose among providers that offer the needed level of care, that participate in Medicare, Medicaid or managed care program needed by the patient, have an available bed and are willing to accept the patient.  01/30/2014  Patient/family informed of [facility] ownership interest in [facility], as well as of the fact that they are under no obligation to receive care at this facility.  PASARR submitted to EDS on  PASARR number received on   FL2 transmitted to all facilities in geographic area requested by pt/family on  01/30/2014 FL2 transmitted to all facilities within larger geographic area on   Patient informed that his/her managed care company has contracts with or will negotiate with  certain facilities, including the following:     Patient/family informed of bed offers received:  02/02/2014 [facility] Physician recommends and patient chooses bed at    [facility]  02/02/2014 Patient to be transferred to facility by Ambulance Patient and family notified of transfer on 02/02/2014 Name of family member notified:  Patient to notify family  The following physician request were  entered in Epic:   Additional Comments:  Per MD patient [facility]. RN, patient, patient's family, and facility notified of DC. RN given number for report. DC packet on chart. AMbulance transport requested for patient. CSW signing off.   Roddie McBryant Latangela Mccomas MSW, [place] Mile[place]CSWA, [place], [phone]

## 2014-02-02 NOTE — Progress Notes (Signed)
Patient's ride has been called.

## 2014-02-02 NOTE — Progress Notes (Signed)
[facility]. Patient is alert and oriented. Pt is hemodynamically stable- vitals taken, patient assessed. IV taken out. Discharge plan appropriate and in place.

## 2014-02-03 NOTE — Progress Notes (Signed)
Occupational Therapy Evaluation:  Pt presents to OT with generalized weakness resulting in decreased independence with BADLs.  He will benefit from continued OT at SNF level rehab.  All further OT needs can be met at SNF therefore, acute OT will sign off.    02/02/14 1700  OT Visit Information  Last OT Received On 02/02/14  Assistance Needed +1  History of Present Illness Mr. Buffin is a 58 yo AAM(appears older) with known IDDM, ESRD, DVT on coumadin, hypothyroidism, depression, HTN, chronic pain and FTT who represents to [facility] from [place] with DKA and AMS. He was at dialysis 10/16 , found to have a glucose >11000 and was transferred to [facility] ED.   Precautions  Precautions Fall  Home Living  Family/patient expects to be discharged to: Skilled nursing facility  Prior Function  Level of Independence Independent with assistive device(s);Needs assistance  ADL's / Homemaking Assistance Needed Pt indicates he has an aide who assisted him with dressing and bathing, but became irritable with questioning, so difficult to accurately assess   Communication  Communication No difficulties  Pain Assessment  Pain Assessment No/denies pain  Cognition  Arousal/Alertness Awake/alert  Behavior During Therapy WFL for tasks assessed/performed  Overall Cognitive Status No family/caregiver present to determine baseline cognitive functioning  Upper Extremity Assessment  Upper Extremity Assessment RUE deficits/detail;LUE deficits/detail  RUE Deficits / Details Pt with generalized weakness.  He maintains both hands in claw position (instrinsic minus)- MCPs extended, PIPs flexed.  When asked to extend digits, he is unable actively extend at [facility] with what appears to be fixed contractures both hands with PROM; however, when observing pt during functional taks, he demonstrates ability to extend index finger functionally, and does demonstrate some active extension of PIPs  RUE Coordination decreased fine motor   LUE Deficits / Details Pt with generalized weakness.  He maintains both hands in claw position (instrinsic minus)- MCPs extended, PIPs flexed.  When asked to extend digits, he is unable actively extend at [facility] with what appears to be fixed contractures both hands with PROM; however, when observing pt during functional taks, he demonstrates ability to extend index finger functionally, and does demonstrate some active extension of PIPs  LUE Coordination decreased fine motor  Lower Extremity Assessment  Lower Extremity Assessment Defer to PT evaluation  Cervical / Trunk Assessment  Cervical / Trunk Assessment Kyphotic  ADL  Overall ADL's  Needs assistance/impaired  Eating/Feeding Set up;Bed level  Grooming Oral care;Brushing hair;Minimal assistance;Bed level  Upper Body Bathing Minimal assitance;Bed level  Lower Body Bathing Moderate assistance;Bed level  Upper Body Dressing  Moderate assistance;Bed level  Lower Body Dressing Maximal assistance;Bed level  Toilet Transfer Minimal assistance;Ambulation;Comfort height toilet  Toileting- Clothing Manipulation and Hygiene Moderate assistance;Sit to/from stand  Bed Mobility  Overal bed mobility Modified Independent  OT - End of Session  Activity Tolerance Patient tolerated treatment well  Patient left in bed;with call bell/phone within reach;with bed alarm set  OT Assessment  OT Therapy Diagnosis  Generalized weakness;Cognitive deficits  OT Recommendation/Assessment All further OT needs can be met in the next venue of care  OT Problem List Decreased strength;Decreased range of motion;Decreased activity tolerance;Impaired balance (sitting and/or standing);Decreased coordination;Decreased knowledge of use of DME or AE;Impaired UE functional use  OT Recommendation  Follow Up Recommendations SNF  OT Equipment None recommended by OT  Individuals Consulted  Consulted and Agree with Results and Recommendations Patient  Acute Rehab OT Goals  Patient  Stated Goal to get better  OT  Time Calculation  OT Start Time 1345  OT Stop Time 1121  OT Time Calculation (min) 8 min  OT General Charges  $OT Visit 1 Procedure  OT Evaluation  $Initial OT Evaluation Tier I 1 Procedure  Written Expression  Dominant Hand Right  Lucille Passy, OTR/L [phone]

## 2014-02-04 ENCOUNTER — Encounter: Payer: Self-pay | Admitting: Vascular Surgery

## 2014-02-05 ENCOUNTER — Encounter: Payer: Medicare Other | Admitting: Vascular Surgery

## 2014-02-05 LAB — CULTURE, BLOOD (ROUTINE X 2)
CULTURE: NO GROWTH
Culture: NO GROWTH

## 2014-02-12 ENCOUNTER — Other Ambulatory Visit: Payer: Self-pay | Admitting: *Deleted

## 2014-02-18 ENCOUNTER — Encounter: Payer: Self-pay | Admitting: Vascular Surgery

## 2014-02-19 ENCOUNTER — Encounter: Payer: Self-pay | Admitting: Vascular Surgery

## 2014-02-19 ENCOUNTER — Ambulatory Visit (INDEPENDENT_AMBULATORY_CARE_PROVIDER_SITE_OTHER): Payer: Medicare Other | Admitting: Vascular Surgery

## 2014-02-19 VITALS — BP 156/99 | HR 109 | Resp 16 | Ht 74.0 in | Wt 145.0 lb

## 2014-02-19 DIAGNOSIS — M79601 Pain in right arm: Secondary | ICD-10-CM

## 2014-02-19 DIAGNOSIS — M79604 Pain in right leg: Secondary | ICD-10-CM

## 2014-02-19 MED ORDER — OXYCODONE-ACETAMINOPHEN 5-325 MG PO TABS: 1.0000 | ORAL_TABLET | Freq: Four times a day (QID) | ORAL | Status: DC | PRN

## 2014-02-19 NOTE — Progress Notes (Signed)
     CC: right thigh pain s/p right thigh graft 12/30/2013.  He states his thigh hurts all the time, and worse when they stick it for dialysis.  He has no fever, chills no rashes or open ulcers.  He states he just can't take the pain.        Objective Filed Vitals:   02/19/14 1456  BP: 156/99  Pulse: 109  Resp: 16  Height: 6\' 2"  (1.88 m)  Weight: 145 lb (65.772 kg)     PULM  CTAB CV  RRR  VASC  Right thigh graft with palpable thrill.  No erythema or edema surrounding the graft. Incision is well healed.  He has a palpable DP pulse distally without foot ulcers.    Accessment: S/P right thigh graft.  He has had multiple access surgeries and he is running out of options.  He states he can't takt the pain in his thigh.  We recommended discussing this with Dr. Hyman HopesWebb his nephrologist as well.  We gave him #30 percocet to take pre-dialysis to see if this helps his pain.  He will return PRN. He was seen today in conjunction with Dr. Darrick PennaFields today  Thomasena EdisOLLINS, [facility] PA-C  [facility] [place] Office: [phone]   02/19/2014, 3:29 PM   History and exam details as above. The patient complains of pain over his right thigh graft. There is no external evidence of infection. There is no erythema there is no drainage. The incisions well-healed. He has palpable pulses right foot. Unfortunately the patient has limited dialysis access options. I basically had a discussion with the patient today that if we took the graft out of his right leg he would have no other dialysis options. He is going to discuss with Dr. Hyman HopesWebb whether or not to discontinue dialysis. We did give him a prescription for Percocet today number dispensed. He will follow-up on as-needed basis.  Fabienne Brunsharles Fields, MD [facility] [place] Office: [phone] Pager: [phone]

## 2014-03-25 ENCOUNTER — Inpatient Hospital Stay (HOSPITAL_COMMUNITY)
Admission: EM | Admit: 2014-03-25 | Discharge: 2014-03-28 | DRG: 637 | Disposition: A | Discharge status: Skilled Nursing Facility | Payer: Medicare Other | Source: Other Acute Inpatient Hospital | Attending: Internal Medicine | Admitting: Internal Medicine

## 2014-03-25 DIAGNOSIS — E871 Hypo-osmolality and hyponatremia: Secondary | ICD-10-CM | POA: Diagnosis not present

## 2014-03-25 DIAGNOSIS — E1101 Type 2 diabetes mellitus with hyperosmolarity with coma: Secondary | ICD-10-CM | POA: Diagnosis present

## 2014-03-25 DIAGNOSIS — K219 Gastro-esophageal reflux disease without esophagitis: Secondary | ICD-10-CM | POA: Diagnosis present

## 2014-03-25 DIAGNOSIS — Z794 Long term (current) use of insulin: Secondary | ICD-10-CM

## 2014-03-25 DIAGNOSIS — Z86718 Personal history of other venous thrombosis and embolism: Secondary | ICD-10-CM

## 2014-03-25 DIAGNOSIS — R410 Disorientation, unspecified: Secondary | ICD-10-CM | POA: Diagnosis present

## 2014-03-25 DIAGNOSIS — F17210 Nicotine dependence, cigarettes, uncomplicated: Secondary | ICD-10-CM | POA: Diagnosis not present

## 2014-03-25 DIAGNOSIS — N189 Chronic kidney disease, unspecified: Secondary | ICD-10-CM

## 2014-03-25 DIAGNOSIS — E875 Hyperkalemia: Secondary | ICD-10-CM | POA: Diagnosis present

## 2014-03-25 DIAGNOSIS — E1040 Type 1 diabetes mellitus with diabetic neuropathy, unspecified: Secondary | ICD-10-CM | POA: Diagnosis not present

## 2014-03-25 DIAGNOSIS — Z9981 Dependence on supplemental oxygen: Secondary | ICD-10-CM | POA: Diagnosis not present

## 2014-03-25 DIAGNOSIS — F329 Major depressive disorder, single episode, unspecified: Secondary | ICD-10-CM | POA: Diagnosis present

## 2014-03-25 DIAGNOSIS — Z681 Body mass index (BMI) 19 or less, adult: Secondary | ICD-10-CM | POA: Diagnosis not present

## 2014-03-25 DIAGNOSIS — N186 End stage renal disease: Secondary | ICD-10-CM | POA: Diagnosis not present

## 2014-03-25 DIAGNOSIS — E1301 Other specified diabetes mellitus with hyperosmolarity with coma: Secondary | ICD-10-CM | POA: Diagnosis present

## 2014-03-25 DIAGNOSIS — Z8673 Personal history of transient ischemic attack (TIA), and cerebral infarction without residual deficits: Secondary | ICD-10-CM

## 2014-03-25 DIAGNOSIS — R64 Cachexia: Secondary | ICD-10-CM | POA: Diagnosis not present

## 2014-03-25 DIAGNOSIS — N2581 Secondary hyperparathyroidism of renal origin: Secondary | ICD-10-CM | POA: Diagnosis not present

## 2014-03-25 DIAGNOSIS — Z79899 Other long term (current) drug therapy: Secondary | ICD-10-CM

## 2014-03-25 DIAGNOSIS — E785 Hyperlipidemia, unspecified: Secondary | ICD-10-CM | POA: Diagnosis present

## 2014-03-25 DIAGNOSIS — Z992 Dependence on renal dialysis: Secondary | ICD-10-CM | POA: Diagnosis not present

## 2014-03-25 DIAGNOSIS — Z7901 Long term (current) use of anticoagulants: Secondary | ICD-10-CM | POA: Diagnosis not present

## 2014-03-25 DIAGNOSIS — J449 Chronic obstructive pulmonary disease, unspecified: Secondary | ICD-10-CM | POA: Diagnosis not present

## 2014-03-25 DIAGNOSIS — E1022 Type 1 diabetes mellitus with diabetic chronic kidney disease: Secondary | ICD-10-CM

## 2014-03-25 DIAGNOSIS — I509 Heart failure, unspecified: Secondary | ICD-10-CM | POA: Diagnosis present

## 2014-03-25 DIAGNOSIS — I158 Other secondary hypertension: Secondary | ICD-10-CM | POA: Diagnosis not present

## 2014-03-25 DIAGNOSIS — G9340 Encephalopathy, unspecified: Secondary | ICD-10-CM | POA: Diagnosis present

## 2014-03-25 DIAGNOSIS — E039 Hypothyroidism, unspecified: Secondary | ICD-10-CM | POA: Diagnosis not present

## 2014-03-25 DIAGNOSIS — I151 Hypertension secondary to other renal disorders: Secondary | ICD-10-CM

## 2014-03-25 DIAGNOSIS — F17200 Nicotine dependence, unspecified, uncomplicated: Secondary | ICD-10-CM | POA: Diagnosis present

## 2014-03-25 DIAGNOSIS — E1365 Other specified diabetes mellitus with hyperglycemia: Secondary | ICD-10-CM | POA: Diagnosis present

## 2014-03-25 DIAGNOSIS — E1110 Type 2 diabetes mellitus with ketoacidosis without coma: Secondary | ICD-10-CM | POA: Insufficient documentation

## 2014-03-25 DIAGNOSIS — E1029 Type 1 diabetes mellitus with other diabetic kidney complication: Secondary | ICD-10-CM | POA: Diagnosis present

## 2014-03-25 DIAGNOSIS — Z720 Tobacco use: Secondary | ICD-10-CM

## 2014-03-25 DIAGNOSIS — N289 Disorder of kidney and ureter, unspecified: Secondary | ICD-10-CM

## 2014-03-25 LAB — BASIC METABOLIC PANEL
ANION GAP: 15 (ref 5–15)
BUN: 69 mg/dL — ABNORMAL HIGH (ref 6–23)
CO2: 25 mEq/L (ref 19–32)
Calcium: 9.3 mg/dL (ref 8.4–10.5)
Chloride: 83 mEq/L — ABNORMAL LOW (ref 96–112)
Creatinine, Ser: 5.12 mg/dL — ABNORMAL HIGH (ref 0.50–1.35)
GFR, EST AFRICAN AMERICAN: 13 mL/min — AB (ref >90)
GFR, EST NON AFRICAN AMERICAN: 11 mL/min — AB (ref >90)
GLUCOSE: 176 mg/dL — AB (ref 70–99)
POTASSIUM: 5.5 meq/L — AB (ref 3.7–5.3)
SODIUM: 123 meq/L — AB (ref 137–147)

## 2014-03-25 LAB — MRSA PCR SCREENING: MRSA by PCR: NEGATIVE

## 2014-03-25 LAB — GLUCOSE, CAPILLARY: GLUCOSE-CAPILLARY: 209 mg/dL — AB (ref 70–99)

## 2014-03-25 MED ORDER — SODIUM CHLORIDE 0.9 % IV SOLN: INTRAVENOUS | Status: DC

## 2014-03-25 MED ORDER — DARBEPOETIN ALFA 150 MCG/0.3ML IJ SOSY
PREFILLED_SYRINGE | INTRAMUSCULAR | Status: AC
  Administered 2014-03-26: 120 ug via INTRAVENOUS
  Filled 2014-03-25: qty 0.3

## 2014-03-25 MED ORDER — DEXTROSE-NACL 5-0.45 % IV SOLN
INTRAVENOUS | Status: DC
  Administered 2014-03-26: 06:00:00 via INTRAVENOUS

## 2014-03-25 MED ORDER — DARBEPOETIN ALFA 150 MCG/0.3ML IJ SOSY
120.0000 ug | PREFILLED_SYRINGE | INTRAMUSCULAR | Status: DC
  Administered 2014-03-26: 120 ug via INTRAVENOUS
  Filled 2014-03-25: qty 0.3

## 2014-03-25 MED ORDER — SODIUM CHLORIDE 0.9 % IV SOLN
INTRAVENOUS | Status: DC
  Administered 2014-03-26: 2.1 [IU]/h via INTRAVENOUS
  Filled 2014-03-25: qty 2.5

## 2014-03-25 MED ORDER — NA FERRIC GLUC CPLX IN SUCROSE 12.5 MG/ML IV SOLN
62.5000 mg | INTRAVENOUS | Status: DC
  Administered 2014-03-26: 62.5 mg via INTRAVENOUS
  Filled 2014-03-25: qty 5

## 2014-03-25 MED ORDER — CALCITRIOL 0.5 MCG PO CAPS
0.5000 ug | ORAL_CAPSULE | ORAL | Status: DC
  Administered 2014-03-26: 0.5 ug via ORAL
  Filled 2014-03-25 (×2): qty 1

## 2014-03-25 NOTE — Consult Note (Signed)
[facility] RENAL CONSULTATION NOTE Requesting Physician:  Dr. Lovell Sheehan Reason for Consult:  Management of ESRD and dialysis related issues HPI: The patient is a 58 y.o. year-old AAM with DM, ESRD on MWF HD in [place], HTN, HLD, prior stroke, COPD. Had elevated BS's at the SNF today despite repeated doses of insulin, and they were afraid to send him to HD (so he missed his treatment) and was taken to [facility] ED where BS was in the 600's, K 7, Na 120. Received insulin, kayexalate, transferred to [facility] for further management.  We brought him up to the dialysis unit for his treatment. A stat renal panel is pending. Primary service is treating for DKA. In insulin drip.  He has both TDC and right thigh AVG - he refused to allow cannulation of his thigh graft - "it's sore" so his TDC is being used. His graft does not appear infected and the [facility] exit site looks fine as well.   Past Medical History  Diagnosis Date  . Hypothyroidism   . Hyperlipidemia   . CVA (cerebrovascular accident)     hx CVA x 2  . GI bleed     2006, mallory-weiss tear  . Pulmonary edema     nov 2011, dry wt decreased  . Goiter     by CT Apr 2013  . Depression   . Cocaine abuse     past history  . Tobacco abuse     history of  . GERD (gastroesophageal reflux disease)   . Cataract   . Insomnia   . Chronic pain   . HTN (hypertension)     sees Dr. Ardelle Park, Rosalita Levan  . Neuromuscular disorder     diabetic neuropathy  . Occlusion of carotid stent     Per patients nursing aid  . Pneumonia     hx of  . Diabetes mellitus     Sees Dr. Reather Littler [phone] -- fasting blood 70-110s  . DKA (diabetic ketoacidosis)     severe, Dec 2011  . History of blood transfusion   . On home oxygen therapy     2l nasal cannula at night  . Hemodialysis patient     M, W, F  . CHF (congestive heart failure)   . DVT (deep venous thrombosis)   . ESRD (end stage renal disease) on dialysis     hd - [facility] - [place]  . COPD  (chronic obstructive pulmonary disease)     Past Surgical History  Procedure Laterality Date  . Av fistula placement  03/25/2008    right forearm  . Av fistula placement  04/23/2012    Procedure: INSERTION OF ARTERIOVENOUS (AV) GORE-TEX GRAFT THIGH;  Surgeon: Sherren Kerns, MD;  Location: [facility] OR;  Service: Vascular;  Laterality: Left;  . Arteriovenous graft placement Left     thigh  . Carotid stent insertion Right   . Dyalisis catheter    . Eye surgery Bilateral     cataracts  . Thyroid surgery  01/28/2013    Dr Gerrit Friends  . Lymph node biopsy Right 01/28/2013    Procedure: LYMPH NODE BIOPSY;  Surgeon: Velora Heckler, MD;  Location[facility] OR;  Service: General;  Laterality: Right;  . Mass biopsy N/A 01/28/2013    Procedure: NECK EXPLORATION;  Surgeon: Velora Heckler, MD;  Location: [facility] OR;  Service: General;  Laterality: N/A;  . Ligation arteriovenous gortex graft Right 02/27/2013    Procedure: LIGATION ARTERIOVENOUS GORTEX GRAFT;  Surgeon: Di Kindle  Edilia Boickson, MD;  [facility];  Service: Vascular;  Laterality: Right;  . Ligation arteriovenous gortex graft Left 03/03/2013    Procedure: LIGATION Left ARTERIOVENOUS GORTEX Thigh GRAFT;  Surgeon: Fransisco HertzBrian L Chen, MD;  [facility];  Service: Vascular;  Laterality: Left;  . Insertion of dialysis catheter Right 03/03/2013    Procedure: INSERTION OF DIALYSIS CATHETER;  Surgeon: Fransisco HertzBrian L Chen, MD;  [facility];  Service: Vascular;  Laterality: Right;  . Av fistula placement Left 03/14/2013    Procedure: INSERTION OF  ARTERIOVENOUS (AV) GORE-TEX GRAFT  LEFT THIGH;  Surgeon: Larina Earthlyodd F Early, MD;  [facility];  Service: Vascular;  Laterality: Left;  . Avgg removal Left 04/16/2013    Procedure: REMOVAL OF ARTERIOVENOUS GORETEX GRAFT Left Thigh;  Surgeon: Nada LibmanVance W Brabham, MD;  [facility];  Service: Vascular;  Laterality: Left;  . Patch angioplasty Left 04/16/2013    Procedure: Patch Angioplasty of left femoral artery;  Surgeon: Nada LibmanVance W  Brabham, MD;  [facility];  Service: Vascular;  Laterality: Left;  . Colonoscopy  09/16/13  . Removal of a dialysis catheter Right 09/18/2013    Procedure: REMOVAL OF A DIALYSIS CATHETER-RIGHT FEMORAL VEIN;  Surgeon: Pryor OchoaJames D Lawson, MD;  [facility];  Service: Vascular;  Laterality: Right;  . Insertion of dialysis catheter Left 09/18/2013    Procedure: INSERTION OF DIALYSIS CATHETER-LEFT NECK VS LEFT THIGH;  Surgeon: Pryor OchoaJames D Lawson, MD;  [facility];  Service: Vascular;  Laterality: Left;  . Av fistula placement Right 12/30/2013    Procedure: INSERTION OF ARTERIOVENOUS (AV) GORE-TEX GRAFT THIGH;  Surgeon: Fransisco HertzBrian L Chen, MD;  [facility];  Service: Vascular;  Laterality: Right;    Family History  Problem Relation Age of Onset  . Diabetes Sister   . Diabetes Brother   . Hypertension Brother   . Heart disease Brother   . Hypertension Mother   . Heart attack Mother   . Hyperlipidemia Mother   . Heart disease Mother     before age 58  . Hypertension Father    Social History:  reports that he has been smoking Cigarettes.  He has been smoking about 0.25 packs per day. He has never used smokeless tobacco. He reports that he does not drink alcohol or use illicit drugs.  Allergies:  Allergies  Allergen Reactions  . Penicillins Other (See Comments)    Unknown reaction    Home medications: Prior to Admission medications   Medication Sig Start Date End Date Taking? Authorizing Provider  amLODipine (NORVASC) 10 MG tablet Take 1 tablet (10 mg total) by mouth at bedtime. 02/02/14   Jeralyn BennettEzequiel Zamora, MD  calcitRIOL (ROCALTROL) 0.25 MCG capsule Take 1 capsule (0.25 mcg total) by mouth every Monday, Wednesday, and Friday. 02/02/14   Jeralyn BennettEzequiel Zamora, MD  docusate sodium (COLACE) 100 MG capsule Take 1 capsule (100 mg total) by mouth 2 (two) times daily. 02/02/14   Jeralyn BennettEzequiel Zamora, MD  FLUoxetine (PROZAC) 20 MG/5ML solution Take 1.3 mLs (5.2 mg total) by mouth daily. 02/02/14   Jeralyn BennettEzequiel  Zamora, MD  insulin aspart (NOVOLOG) 100 UNIT/ML injection Inject 0-9 Units into the skin 4 (four) times daily -  before meals and at bedtime. 02/02/14   Jeralyn BennettEzequiel Zamora, MD  insulin glargine (LANTUS) 100 UNIT/ML injection Inject 0.05 mLs (5 Units total) into the skin 2 (two) times daily. 02/02/14   Jeralyn BennettEzequiel Zamora, MD  levothyroxine (SYNTHROID, LEVOTHROID) 125 MCG tablet Take 2 tablets (250 mcg total) by mouth  daily before breakfast. 02/02/14   Jeralyn Bennett, MD  metoprolol (LOPRESSOR) 50 MG tablet Take 1 tablet (50 mg total) by mouth 2 (two) times daily. 02/02/14   Jeralyn Bennett, MD  oxyCODONE-acetaminophen (PERCOCET/ROXICET) 5-325 MG per tablet Take 1 tablet by mouth every 6 (six) hours as needed. 02/19/14   Lars Mage, PA-C  pravastatin (PRAVACHOL) 10 MG tablet Take 1 tablet (10 mg total) by mouth at bedtime. 02/02/14   Jeralyn Bennett, MD  PROAIR HFA 108 (90 BASE) MCG/ACT inhaler Inhale 2 puffs into the lungs every 4 (four) hours as needed for wheezing or shortness of breath.  12/08/12   Historical Provider, MD  warfarin (COUMADIN) 1 MG tablet Take 1 mg by mouth daily.    Historical Provider, MD  warfarin (COUMADIN) 7.5 MG tablet Take 7.5 mg by mouth daily.    Historical Provider, MD    Inpatient medications: . [START ON 03/27/2014] calcitRIOL  0.5 mcg Oral Q M,W,F-HD  . darbepoetin (ARANESP) injection - DIALYSIS  120 mcg Intravenous Q Wed-HD  . [START ON 04/01/2014] ferric gluconate (FERRLECIT/NULECIT) IV  62.5 mg Intravenous Q Wed-HD    Review of Systems Difficult to obtain - he denies pain (except that his thigh graft is "too sore to stick") Denies CP, SOB, nausea, vomiting Says aware BS "too high:  Physical Exam:  BP 147/71 mmHg  Pulse 67  Temp(Src) 97.5 F (36.4 C) (Oral)  Resp 16  Ht 6\' 2"  (1.88 m)  Wt 63.2 kg (139 lb 5.3 oz)  BMI 17.88 kg/m2  SpO2 100%  Gen: Elderly AAM NAD.  Tunnelled dialysis catheter in place left IJ. Exit site clean. Suture in place  (removed) Oriented to person, "hospital", not to day or date Edentulous Skin: excoriations bilat LE's Neck: no JVD, no bruits. Large goiter. Much superficial venous prominence. Chest: Lung fields grossly clear Heart: Regular S1S2 No S3  Abdomen: soft, no focal tenderness. BS present Ext: Patent right thigh graft with evidence prior cannulations. No suspicious areas for infection. + bruit. Neuro: Awake, alert, orientation as above  LABS from [facility] pending at this time (From [facility] - glucose 650, K 7, Na 120)  Dialysis prescription: MWF  4.25 hours 400/A1.5 2K 2.25Ca Right thigh AVG (also has tunnelled catheter that is supposed to be removed 12/15) Aranesp 120/week Calcitriol 0.5 mcg TIW Aranesp 120/week Venofer 50/week No heparin  Xrays/Other Studies:  Impression/Plan 1. ESRD - missed HD today d/t uncontrolled DM. Rec'd kayexalate in the ED at [facility]. Stat renal panel pending pre HD. He refused to allow cannulation of the thigh graft tonight.  Will have to push the issue with his next treatment.  TDC needs to come out. 2. Hyperkalemia - repeat K pending. S/p kayexalate.  2K bath on HD pending results 3. Hyponatremia - likely related to elevated BS. Will correct with correction of BS and with HD 4. HHNC - pt is on insulin drip protocol.  However, cannot give large volumes of fluid to HD pt.  Dialysis will likely help with his BS control.  Decrease rate of IVF. 5. IDDM - pt had similar admission last month and has had others in past.  Not sure what precipitated this one.  Need clarification of outpt insulin regimen - should be able to get recs from his living facility 6. HTN - BP not issue right now 7. Anemia - CBC pending. Continue iron and Aranesp 8. Secondary HPT - continue oral calcitriol. Confirm binders in the AM  Camille Bal,  MD  [facility] [phone] pager 03/25/2014, 9:37 PM

## 2014-03-25 NOTE — Procedures (Signed)
I have personally attended this patient's dialysis session.   2K bath - K 5.5 pre HD EDW 57.5 kg - goal 3.5 liters Refused cannulation of thigh AVG - using TDC  Camille Balynthia Wynter Grave, MD [facility] [phone] Pager 03/25/2014, 11:15 PM

## 2014-03-25 NOTE — H&P (Signed)
[facility] Admission History and Physical       Marcus [name] ZOX:[id number] DOB: 07/09/1955 DOA: 03/25/2014  Referring physician: Nunzio Cory Dr Loleta Chance PCP: Galvin Proffer, MD  Specialists:   Chief Complaint: Confusion  HPI: Marcus [name] is a 58 y.o. male with history of IDDM, ESRD on HD (MWF) , HTN, Hypothyroid, COPD, who presented to the ED at [facility] with complaints of increased confusion and was found to have hyperglycemia with a glucose of 619, and electrolyte imbalances of hyperkalemia (K+ =7.0) with Hyponatremia (Na+ = 120).  He missed his dialysis today and was last dialyzed on Monday  He was administered IV Calcium Gluconate, and IV Insulin and oral Kayexalate x1, and the EDP spoke with [employer] and [facility] and transfered him to [facility].    [facility] Eliott Nine was notified when patient arrived and is to have Emergent dialysis this evening.       Review of Systems: Unable to Obtain from the Patient  Past Medical History  Diagnosis Date  . Hypothyroidism   . Hyperlipidemia   . CVA (cerebrovascular accident)     hx CVA x 2  . GI bleed     2006, mallory-weiss tear  . Pulmonary edema     nov 2011, dry wt decreased  . Goiter     by CT Apr 2013  . Depression   . Cocaine abuse     past history  . Tobacco abuse     history of  . GERD (gastroesophageal reflux disease)   . Cataract   . Insomnia   . Chronic pain   . HTN (hypertension)     sees Dr. Ardelle Park, Rosalita Levan  . Neuromuscular disorder     diabetic neuropathy  . Occlusion of carotid stent     Per patients nursing aid  . Pneumonia     hx of  . Diabetes mellitus     Sees Dr. Reather Littler [phone] -- fasting blood 70-110s  . DKA (diabetic ketoacidosis)     severe, Dec 2011  . History of blood transfusion   . On home oxygen therapy     2l nasal cannula at night  . Hemodialysis patient     M, W, F  . CHF (congestive heart failure)     . DVT (deep venous thrombosis)   . ESRD (end stage renal disease) on dialysis     hd - mwf - [place]  . COPD (chronic obstructive pulmonary disease)       Past Surgical History  Procedure Laterality Date  . Av fistula placement  03/25/2008    right forearm  . Av fistula placement  04/23/2012    Procedure: INSERTION OF ARTERIOVENOUS (AV) GORE-TEX GRAFT THIGH;  Surgeon: Sherren Kerns, MD;  Location: [facility] OR;  Service: Vascular;  Laterality: Left;  . Arteriovenous graft placement Left     thigh  . Carotid stent insertion Right   . Dyalisis catheter    . Eye surgery Bilateral     cataracts  . Thyroid surgery  01/28/2013    Dr Gerrit Friends  . Lymph node biopsy Right 01/28/2013    Procedure: LYMPH NODE BIOPSY;  Surgeon: Velora Heckler, MD;  Location: [facility] OR;  Service: General;  Laterality: Right;  . Mass biopsy N/A 01/28/2013    Procedure: NECK EXPLORATION;  Surgeon: Velora Heckler, MD;  Location: [facility] OR;  Service: General;  Laterality: N/A;  . Ligation arteriovenous gortex graft  Right 02/27/2013    Procedure: LIGATION ARTERIOVENOUS GORTEX GRAFT;  Surgeon: Chuck Hinthristopher S Dickson, MD;  [facility];  Service: Vascular;  Laterality: Right;  . Ligation arteriovenous gortex graft Left 03/03/2013    Procedure: LIGATION Left ARTERIOVENOUS GORTEX Thigh GRAFT;  Surgeon: Fransisco HertzBrian L Chen, MD;  [facility];  Service: Vascular;  Laterality: Left;  . Insertion of dialysis catheter Right 03/03/2013    Procedure: INSERTION OF DIALYSIS CATHETER;  Surgeon: Fransisco HertzBrian L Chen, MD;  [facility];  Service: Vascular;  Laterality: Right;  . Av fistula placement Left 03/14/2013    Procedure: INSERTION OF  ARTERIOVENOUS (AV) GORE-TEX GRAFT  LEFT THIGH;  Surgeon: Larina Earthlyodd F Early, MD;  [facility];  Service: Vascular;  Laterality: Left;  . Avgg removal Left 04/16/2013    Procedure: REMOVAL OF ARTERIOVENOUS GORETEX GRAFT Left Thigh;  Surgeon: Nada LibmanVance W Brabham, MD;  [facility];  Service: Vascular;  Laterality: Left;   . Patch angioplasty Left 04/16/2013    Procedure: Patch Angioplasty of left femoral artery;  Surgeon: Nada LibmanVance W Brabham, MD;  [facility];  Service: Vascular;  Laterality: Left;  . Colonoscopy  09/16/13  . Removal of a dialysis catheter Right 09/18/2013    Procedure: REMOVAL OF A DIALYSIS CATHETER-RIGHT FEMORAL VEIN;  Surgeon: Pryor OchoaJames D Lawson, MD;  [facility];  Service: Vascular;  Laterality: Right;  . Insertion of dialysis catheter Left 09/18/2013    Procedure: INSERTION OF DIALYSIS CATHETER-LEFT NECK VS LEFT THIGH;  Surgeon: Pryor OchoaJames D Lawson, MD;  [facility];  Service: Vascular;  Laterality: Left;  . Av fistula placement Right 12/30/2013    Procedure: INSERTION OF ARTERIOVENOUS (AV) GORE-TEX GRAFT THIGH;  Surgeon: Fransisco HertzBrian L Chen, MD;  [facility] OR;  Service: Vascular;  Laterality: Right;       Prior to Admission medications   Medication Sig Start Date End Date Taking? Authorizing Provider  amLODipine (NORVASC) 10 MG tablet Take 1 tablet (10 mg total) by mouth at bedtime. 02/02/14   Jeralyn BennettEzequiel Zamora, MD  calcitRIOL (ROCALTROL) 0.25 MCG capsule Take 1 capsule (0.25 mcg total) by mouth every Monday, Wednesday, and Friday. 02/02/14   Jeralyn BennettEzequiel Zamora, MD  docusate sodium (COLACE) 100 MG capsule Take 1 capsule (100 mg total) by mouth 2 (two) times daily. 02/02/14   Jeralyn BennettEzequiel Zamora, MD  FLUoxetine (PROZAC) 20 MG/5ML solution Take 1.3 mLs (5.2 mg total) by mouth daily. 02/02/14   Jeralyn BennettEzequiel Zamora, MD  insulin aspart (NOVOLOG) 100 UNIT/ML injection Inject 0-9 Units into the skin 4 (four) times daily -  before meals and at bedtime. 02/02/14   Jeralyn BennettEzequiel Zamora, MD  insulin glargine (LANTUS) 100 UNIT/ML injection Inject 0.05 mLs (5 Units total) into the skin 2 (two) times daily. 02/02/14   Jeralyn BennettEzequiel Zamora, MD  levothyroxine (SYNTHROID, LEVOTHROID) 125 MCG tablet Take 2 tablets (250 mcg total) by mouth daily before breakfast. 02/02/14   Jeralyn BennettEzequiel Zamora, MD  metoprolol (LOPRESSOR) 50 MG tablet Take 1  tablet (50 mg total) by mouth 2 (two) times daily. 02/02/14   Jeralyn BennettEzequiel Zamora, MD  oxyCODONE-acetaminophen (PERCOCET/ROXICET) 5-325 MG per tablet Take 1 tablet by mouth every 6 (six) hours as needed. 02/19/14   Lars MageEmma M Collins, PA-C  pravastatin (PRAVACHOL) 10 MG tablet Take 1 tablet (10 mg total) by mouth at bedtime. 02/02/14   Jeralyn BennettEzequiel Zamora, MD  PROAIR HFA 108 (90 BASE) MCG/ACT inhaler Inhale 2 puffs into the lungs every 4 (four) hours as needed for wheezing or shortness of breath.  12/08/12  Historical Provider, MD  warfarin (COUMADIN) 1 MG tablet Take 1 mg by mouth daily.    Historical Provider, MD  warfarin (COUMADIN) 7.5 MG tablet Take 7.5 mg by mouth daily.    Historical Provider, MD      Allergies  Allergen Reactions  . Penicillins Other (See Comments)    Unknown reaction     Social History:  reports that he has been smoking Cigarettes.  He has been smoking about 0.25 packs per day. He has never used smokeless tobacco. He reports that he does not drink alcohol or use illicit drugs.     Family History  Problem Relation Age of Onset  . Diabetes Sister   . Diabetes Brother   . Hypertension Brother   . Heart disease Brother   . Hypertension Mother   . Heart attack Mother   . Hyperlipidemia Mother   . Heart disease Mother     before age 8  . Hypertension Father        Physical Exam:  GEN:  Pleasant and Confused  Cachectic 58 y.o. male  examined  and in no acute distress; cooperative with exam There were no vitals filed for this visit. There were no vitals taken for this visit. PSYCH: He is alert and oriented x 2; does not appear anxious does not appear depressed; affect is normal HEENT: Normocephalic and Atraumatic, Mucous membranes pink; PERRLA; EOM intact; Fundi:  Benign;  No scleral icterus, Nares: Patent, Oropharynx: Clear, Edentulous,    Neck:  FROM, No Cervical Lymphadenopathy nor Thyromegaly or Carotid Bruit; No JVD; Breasts:: Not examined CHEST WALL: No  tenderness CHEST: Normal respiration, clear to auscultation bilaterally HEART: Regular rate and rhythm; no murmurs rubs or gallops BACK: No kyphosis or scoliosis; No CVA tenderness ABDOMEN: Positive Bowel Sounds, Scaphoid,  Soft Non-Tender; No Masses, No Organomegaly,  Rectal Exam: Not done EXTREMITIES: No  Genitalia: not examined PULSES: 2+ and symmetric SKIN: Dry,   CNS:   Vascular: pulses palpable throughout    Labs on Admission:  Basic Metabolic Panel: No results for input(s): NA, K, CL, CO2, GLUCOSE, BUN, CREATININE, CALCIUM, MG, PHOS in the last 168 hours. Liver Function Tests: No results for input(s): AST, ALT, ALKPHOS, BILITOT, PROT, ALBUMIN in the last 168 hours. No results for input(s): LIPASE, AMYLASE in the last 168 hours. No results for input(s): AMMONIA in the last 168 hours. CBC: No results for input(s): WBC, NEUTROABS, HGB, HCT, MCV, PLT in the last 168 hours. Cardiac Enzymes: No results for input(s): CKTOTAL, CKMB, CKMBINDEX, TROPONINI in the last 168 hours.  BNP (last 3 results) No results for input(s): PROBNP in the last 8760 hours. CBG: No results for input(s): GLUCAP in the last 168 hours.  Radiological Exams on Admission: No results found.   EKG: Independently reviewed.    Assessment/Plan:   58 y.o. male with   Principal Problem:   1.   HHNC (hyperglycemic hyperosmolar nonketotic coma)- reviewed ABG and labs from [facility]   IV Insulin Drip Protocol   Monitor Electrolytes   Active Problems:   2.   Hyperkalemia   Administered Kayexalae X 1 dose, along with IV Calcium Gluconate     and IV Insulin x 1 Prior to Transfer   To Receive Emergent Dialysis This Evening     3.   Acute encephalopathy-  due to #1, and Electrolyte Imbalances   Monitor Electrolytes     4.   ESRD (end stage renal disease) on dialysis   Emergent Dialysis This  Evening     5.   Hyponatremia   Should Correct with Dialysis and Treatment for HHNC     6.   DM type 1  causing renal disease   IV Insulin Drip   Monitor Glucose Levels   Verify Home Meds   Transition to Insulin and SSI coverage PRN    7.   COPD (chronic obstructive pulmonary disease)   Albuterol Nebs PRN   Monitor O2 sats   O2 PRN     8.   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   Hypothyroidism   Check TSH   10.  Tobacco use disorder   Smokes 1-2 cigarettes Daily   counseled   11.  DVT Prophylaxis    SCDs     Code Status:     FULL CODE  Family Communication:    No Family Present Disposition Plan:       Inpatient Stepdown Unit  Time spent:   70 MInutes  Ron ParkerJENKINS,Shourya Macpherson C [facility] Pager [phone]   If 7AM -7PM Please Contact the Day Rounding Team MD for [facility]  If 7PM-7AM, Please Contact Night-Floor Coverage  [email or web address] Password [email or web address] 03/25/2014, 8:49 PM

## 2014-03-26 ENCOUNTER — Encounter (HOSPITAL_COMMUNITY): Payer: Self-pay | Admitting: Vascular Surgery

## 2014-03-26 DIAGNOSIS — G9340 Encephalopathy, unspecified: Secondary | ICD-10-CM

## 2014-03-26 LAB — BASIC METABOLIC PANEL
ANION GAP: 13 (ref 5–15)
BUN: 25 mg/dL — ABNORMAL HIGH (ref 6–23)
CHLORIDE: 92 meq/L — AB (ref 96–112)
CO2: 26 mEq/L (ref 19–32)
CREATININE: 2.66 mg/dL — AB (ref 0.50–1.35)
Calcium: 8.8 mg/dL (ref 8.4–10.5)
GFR calc non Af Amer: 25 mL/min — ABNORMAL LOW (ref >90)
GFR, EST AFRICAN AMERICAN: 29 mL/min — AB (ref >90)
Glucose, Bld: 313 mg/dL — ABNORMAL HIGH (ref 70–99)
POTASSIUM: 4.6 meq/L (ref 3.7–5.3)
Sodium: 131 mEq/L — ABNORMAL LOW (ref 137–147)

## 2014-03-26 LAB — GLUCOSE, CAPILLARY
GLUCOSE-CAPILLARY: 120 mg/dL — AB (ref 70–99)
GLUCOSE-CAPILLARY: 350 mg/dL — AB (ref 70–99)
Glucose-Capillary: 131 mg/dL — ABNORMAL HIGH (ref 70–99)
Glucose-Capillary: 172 mg/dL — ABNORMAL HIGH (ref 70–99)
Glucose-Capillary: 274 mg/dL — ABNORMAL HIGH (ref 70–99)
Glucose-Capillary: 269 mg/dL — ABNORMAL HIGH (ref 70–99)
Glucose-Capillary: 306 mg/dL — ABNORMAL HIGH (ref 70–99)
Glucose-Capillary: 312 mg/dL — ABNORMAL HIGH (ref 70–99)
Glucose-Capillary: 183 mg/dL — ABNORMAL HIGH (ref 70–99)
Glucose-Capillary: 104 mg/dL — ABNORMAL HIGH (ref 70–99)
Glucose-Capillary: 140 mg/dL — ABNORMAL HIGH (ref 70–99)
Glucose-Capillary: 103 mg/dL — ABNORMAL HIGH (ref 70–99)

## 2014-03-26 LAB — CBC
HCT: 31.1 % — ABNORMAL LOW (ref 39.0–52.0)
Hemoglobin: 10 g/dL — ABNORMAL LOW (ref 13.0–17.0)
MCH: 27.3 pg (ref 26.0–34.0)
MCHC: 32.2 g/dL (ref 30.0–36.0)
MCV: 85 fL (ref 78.0–100.0)
PLATELETS: 219 10*3/uL (ref 150–400)
RBC: 3.66 MIL/uL — ABNORMAL LOW (ref 4.22–5.81)
RDW: 16.3 % — AB (ref 11.5–15.5)
WBC: 4.8 10*3/uL (ref 4.0–10.5)

## 2014-03-26 LAB — RENAL FUNCTION PANEL
ALBUMIN: 2.6 g/dL — AB (ref 3.5–5.2)
Anion gap: 13 (ref 5–15)
BUN: 23 mg/dL (ref 6–23)
CO2: 26 meq/L (ref 19–32)
CREATININE: 2.38 mg/dL — AB (ref 0.50–1.35)
Calcium: 8.7 mg/dL (ref 8.4–10.5)
Chloride: 90 mEq/L — ABNORMAL LOW (ref 96–112)
GFR, EST AFRICAN AMERICAN: 33 mL/min — AB (ref >90)
GFR, EST NON AFRICAN AMERICAN: 28 mL/min — AB (ref >90)
Glucose, Bld: 187 mg/dL — ABNORMAL HIGH (ref 70–99)
PHOSPHORUS: 3.2 mg/dL (ref 2.3–4.6)
Potassium: 3.9 mEq/L (ref 3.7–5.3)
SODIUM: 129 meq/L — AB (ref 137–147)

## 2014-03-26 LAB — TSH: TSH: 10.36 u[IU]/mL — AB (ref 0.350–4.500)

## 2014-03-26 LAB — PROTIME-INR
INR: 2.76 — AB (ref 0.00–1.49)
Prothrombin Time: 29.4 seconds — ABNORMAL HIGH (ref 11.6–15.2)

## 2014-03-26 MED ORDER — INSULIN GLARGINE 100 UNIT/ML ~~LOC~~ SOLN
5.0000 [IU] | Freq: Two times a day (BID) | SUBCUTANEOUS | Status: DC
  Administered 2014-03-26: 5 [IU] via SUBCUTANEOUS
  Filled 2014-03-26 (×3): qty 0.05

## 2014-03-26 MED ORDER — HEPARIN SODIUM (PORCINE) 1000 UNIT/ML DIALYSIS
20.0000 [IU]/kg | INTRAMUSCULAR | Status: DC | PRN
  Filled 2014-03-26: qty 2

## 2014-03-26 MED ORDER — NEPRO/CARBSTEADY PO LIQD
237.0000 mL | Freq: Two times a day (BID) | ORAL | Status: DC
  Administered 2014-03-26 – 2014-03-28 (×4): 237 mL via ORAL

## 2014-03-26 MED ORDER — ALTEPLASE 2 MG IJ SOLR
2.0000 mg | Freq: Once | INTRAMUSCULAR | Status: AC | PRN
  Filled 2014-03-26: qty 2

## 2014-03-26 MED ORDER — NEPRO/CARBSTEADY PO LIQD: 237.0000 mL | ORAL | Status: DC | PRN

## 2014-03-26 MED ORDER — LIDOCAINE-PRILOCAINE 2.5-2.5 % EX CREA
1.0000 "application " | TOPICAL_CREAM | CUTANEOUS | Status: DC | PRN
  Administered 2014-03-27: 1 via TOPICAL
  Filled 2014-03-26: qty 5

## 2014-03-26 MED ORDER — SODIUM CHLORIDE 0.9 % IV SOLN: 100.0000 mL | INTRAVENOUS | Status: DC | PRN

## 2014-03-26 MED ORDER — PENTAFLUOROPROP-TETRAFLUOROETH EX AERO: 1.0000 "application " | INHALATION_SPRAY | CUTANEOUS | Status: DC | PRN

## 2014-03-26 MED ORDER — INSULIN ASPART 100 UNIT/ML ~~LOC~~ SOLN
0.0000 [IU] | Freq: Three times a day (TID) | SUBCUTANEOUS | Status: DC
  Administered 2014-03-26: 1 [IU] via SUBCUTANEOUS
  Administered 2014-03-27: 3 [IU] via SUBCUTANEOUS
  Administered 2014-03-27: 9 [IU] via SUBCUTANEOUS

## 2014-03-26 MED ORDER — LIDOCAINE HCL (PF) 1 % IJ SOLN: 5.0000 mL | INTRAMUSCULAR | Status: DC | PRN

## 2014-03-26 MED ORDER — HEPARIN SODIUM (PORCINE) 1000 UNIT/ML DIALYSIS
1000.0000 [IU] | INTRAMUSCULAR | Status: DC | PRN
  Filled 2014-03-26: qty 1

## 2014-03-26 NOTE — Plan of Care (Signed)
Problem: Phase I Progression Outcomes Goal: Monitor hydration status Outcome: Progressing Goal: NPO or per MD order Outcome: Completed/Met Date Met:  03/26/14 Goal: Nausea/vomiting controlled with antiemetics Outcome: Completed/Met Date Met:  03/26/14 Goal: Pain controlled with appropriate interventions Outcome: Completed/Met Date Met:  03/26/14

## 2014-03-26 NOTE — Progress Notes (Signed)
INITIAL NUTRITION ASSESSMENT  DOCUMENTATION CODES Per approved criteria  -Severe malnutrition in the context of chronic illness -Underweight   INTERVENTION: Nepro Shake po BID, each supplement provides 425 kcal and 19 grams protein RD to follow for nutrition care plan  NUTRITION DIAGNOSIS: Increased nutrient needs related to ESRD on HD, malnutrition as evidenced by estimated nutrition needs  Goal: Pt to meet >/= 90% of their estimated nutrition needs   Monitor:  PO & supplemental intake, weight, labs, I/O's  Reason for Assessment: Malnutrition Screening Tool Report  58 y.o. male  Admitting Dx: HHNC (hyperglycemic hyperosmolar nonketotic coma)  ASSESSMENT: 58 y.o. male with history of IDDM, ESRD on HD (MWF), HTN, hypothyroid, COPD, who presented to the ED at [facility] with complaints of increased confusion and was found to have hyperglycemia with a glucose of 619, and electrolyte imbalances of hyperkalemia (K+ =7.0) with Hyponatremia (Na+ = 120).   He missed his dialysis 12/9 and was last dialyzed on Monday. He was administered IV Calcium Gluconate, and IV Insulin and oral Kayexalate x1, and the EDP spoke with [employer] and [facility] and transfered him to [facility].    Patient known to Clinical Nutrition during previous hospitalizations.  Severe malnutrition dx ongoing.  Weight has been stable per wt readings below.  Pt eating lunch upon RD visit.  No % PO intake records available.  Pt with hx of variable intake.  Nutrient needs increased given chronic illness.  Would benefit from addition of oral nutrition supplements.  RD to order.  Nutrition Focused Physical Exam:  Subcutaneous Fat:  Orbital Region: N/A Upper Arm Region: moderate-severe depletion Thoracic and Lumbar Region: moderate depletion  Muscle:  Temple Region: moderate depletion Clavicle Bone Region: moderate-severe depletion Clavicle and Acromion Bone Region:  moderate-severe depletion Scapular Bone Region: N/A Dorsal Hand: N/A Patellar Region: severe depletion Anterior Thigh Region: severe depletion Posterior Calf Region: severe depletion  Edema: none  Patient continues to meet criteria for severe malnutrition in the context of chronic illness as evidenced by severe muscle loss and moderate-severe subcutaneous fat loss.  Height: Ht Readings from Last 1 Encounters:  03/26/14 6\' 2"  (1.88 m)    Weight: Wt Readings from Last 1 Encounters:  03/26/14 130 lb 4.7 oz (59.1 kg)    Ideal Body Weight: 82.2 kg  % Ideal Body Weight: 72%  Wt Readings from Last 10 Encounters:  03/26/14 130 lb 4.7 oz (59.1 kg)  02/19/14 145 lb (65.772 kg)  02/02/14 132 lb 4.4 oz (60 kg)  12/30/13 129 lb (58.514 kg)  11/18/13 129 lb 8 oz (58.741 kg)  07/15/13 133 lb 13.1 oz (60.7 kg)  05/28/13 124 lb 5.4 oz (56.4 kg)  05/12/13 135 lb (61.236 kg)  04/29/13 137 lb 9.6 oz (62.415 kg)  04/21/13 132 lb 7.9 oz (60.099 kg)    Usual Body Weight: 132 lb  % Usual Body Weight: 98%  BMI:  Body mass index is 16.72 kg/(m^2).  Estimated Nutritional Needs: Kcal: 1800-2000 Protein: 80-90 gm Fluid: 1200 ml  Skin: Intact  Diet Order: Diet Carb Modified  EDUCATION NEEDS: -No education needs identified at this time   Intake/Output Summary (Last 24 hours) at 03/26/14 1426 Last data filed at 03/26/14 0604  Gross per 24 hour  Intake    2.1 ml  Output   3500 ml  Net -3497.9 ml    Labs:   Recent Labs Lab 03/25/14 2133 03/26/14 0400 03/26/14 0944  NA 123* 129* 131*  K 5.5*  3.9 4.6  CL 83* 90* 92*  CO2 25 26 26   BUN 69* 23 25*  CREATININE 5.12* 2.38* 2.66*  CALCIUM 9.3 8.7 8.8  PHOS  --  3.2  --   GLUCOSE 176* 187* 313*    CBG (last 3)   Recent Labs  03/26/14 0931 03/26/14 1113 03/26/14 1247  GLUCAP 350* 183* 104*    Scheduled Meds: . [START ON 03/27/2014] calcitRIOL  0.5 mcg Oral Q M,W,F-HD  . darbepoetin (ARANESP) injection - DIALYSIS   120 mcg Intravenous Q Wed-HD  . [START ON 04/01/2014] ferric gluconate (FERRLECIT/NULECIT) IV  62.5 mg Intravenous Q Wed-HD  . insulin aspart  0-9 Units Subcutaneous TID WC  . insulin glargine  5 Units Subcutaneous BID    Continuous Infusions:   Past Medical History  Diagnosis Date  . Hypothyroidism   . Hyperlipidemia   . CVA (cerebrovascular accident)     hx CVA x 2  . GI bleed     2006, mallory-weiss tear  . Pulmonary edema     nov 2011, dry wt decreased  . Goiter     by CT Apr 2013  . Depression   . Cocaine abuse     past history  . Tobacco abuse     history of  . GERD (gastroesophageal reflux disease)   . Cataract   . Insomnia   . Chronic pain   . HTN (hypertension)     sees Dr. Ardelle ParkHaque, [place]  . Neuromuscular disorder     diabetic neuropathy  . Occlusion of carotid stent     Per patients nursing aid  . Pneumonia     hx of  . Diabetes mellitus     Sees Dr. Reather LittlerAjay Kumar [phone] -- fasting blood 70-110s  . DKA (diabetic ketoacidosis)     severe, Dec 2011  . History of blood transfusion   . On home oxygen therapy     2l nasal cannula at night  . Hemodialysis patient     M, W, F  . CHF (congestive heart failure)   . DVT (deep venous thrombosis)   . ESRD (end stage renal disease) on dialysis     hd - mwf - [place]  . COPD (chronic obstructive pulmonary disease)     Past Surgical History  Procedure Laterality Date  . Av fistula placement  03/25/2008    right forearm  . Av fistula placement  04/23/2012    Procedure: INSERTION OF ARTERIOVENOUS (AV) GORE-TEX GRAFT THIGH;  Surgeon: Sherren Kernsharles E Fields, MD;  [facility];  Service: Vascular;  Laterality: Left;  . Arteriovenous graft placement Left     thigh  . Carotid stent insertion Right   . Dyalisis catheter    . Eye surgery Bilateral     cataracts  . Thyroid surgery  01/28/2013    Dr Gerrit FriendsGerkin  . Lymph node biopsy Right 01/28/2013    Procedure: LYMPH NODE BIOPSY;  Surgeon: Velora Hecklerodd M Gerkin, MD;  [facility];  Service: General;  Laterality: Right;  . Mass biopsy N/A 01/28/2013    Procedure: NECK EXPLORATION;  Surgeon: Velora Hecklerodd M Gerkin, MD;  [facility];  Service: General;  Laterality: N/A;  . Ligation arteriovenous gortex graft Right 02/27/2013    Procedure: LIGATION ARTERIOVENOUS GORTEX GRAFT;  Surgeon: Chuck Hinthristopher S Dickson, MD;  [facility];  Service: Vascular;  Laterality: Right;  . Ligation arteriovenous gortex graft Left 03/03/2013    Procedure: LIGATION Left ARTERIOVENOUS GORTEX Thigh GRAFT;  Surgeon: Arlys JohnBrian  Rolena InfanteL Chen, MD;  [facility];  Service: Vascular;  Laterality: Left;  . Insertion of dialysis catheter Right 03/03/2013    Procedure: INSERTION OF DIALYSIS CATHETER;  Surgeon: Fransisco HertzBrian L Chen, MD;  [facility];  Service: Vascular;  Laterality: Right;  . Av fistula placement Left 03/14/2013    Procedure: INSERTION OF  ARTERIOVENOUS (AV) GORE-TEX GRAFT  LEFT THIGH;  Surgeon: Larina Earthlyodd F Early, MD;  [facility];  Service: Vascular;  Laterality: Left;  . Avgg removal Left 04/16/2013    Procedure: REMOVAL OF ARTERIOVENOUS GORETEX GRAFT Left Thigh;  Surgeon: Nada LibmanVance W Brabham, MD;  [facility];  Service: Vascular;  Laterality: Left;  . Patch angioplasty Left 04/16/2013    Procedure: Patch Angioplasty of left femoral artery;  Surgeon: Nada LibmanVance W Brabham, MD;  [facility];  Service: Vascular;  Laterality: Left;  . Colonoscopy  09/16/13  . Removal of a dialysis catheter Right 09/18/2013    Procedure: REMOVAL OF A DIALYSIS CATHETER-RIGHT FEMORAL VEIN;  Surgeon: Pryor OchoaJames D Lawson, MD;  [facility];  Service: Vascular;  Laterality: Right;  . Insertion of dialysis catheter Left 09/18/2013    Procedure: INSERTION OF DIALYSIS CATHETER-LEFT NECK VS LEFT THIGH;  Surgeon: Pryor OchoaJames D Lawson, MD;  [facility];  Service: Vascular;  Laterality: Left;  . Av fistula placement Right 12/30/2013    Procedure: INSERTION OF ARTERIOVENOUS (AV) GORE-TEX GRAFT THIGH;  Surgeon: Fransisco HertzBrian L Chen, MD;  [facility];   Service: Vascular;  Laterality: Right;  . Shuntogram Right 03/28/2012    Procedure: SHUNTOGRAM;  Surgeon: Fransisco HertzBrian L Chen, MD;  [facility];  Service: Cardiovascular;  Laterality: Right;    Maureen ChattersKatie Mikenna Bunkley, RD, LDN Pager #: [phone] After-Hours Pager #: [phone]

## 2014-03-26 NOTE — Progress Notes (Signed)
Impression/Plan 1. ESRD - s/p missed HD d/t uncontrolled DM.. 2. TDC and AVG in thigh 3. Hyperkalemia -improved 4. Unc DM                             Plan HD in AM, try EMLA on groin AVGG                                         Subjective: Interval History: Tolerated HD last PM  Objective: Vital signs in last 24 hours: Temp:  [97.3 F (36.3 C)-98.2 F (36.8 C)] 97.9 F (36.6 C) (12/10 0800) Pulse Rate:  [63-82] 82 (12/10 0800) Resp:  [12-19] 12 (12/10 0800) BP: (117-176)/(58-79) 176/78 mmHg (12/10 0800) SpO2:  [97 %-100 %] 97 % (12/10 0800) Weight:  [59.1 kg (130 lb 4.7 oz)-63.2 kg (139 lb 5.3 oz)] 59.1 kg (130 lb 4.7 oz) (12/10 0402) Weight change:   Intake/Output from previous day: 12/09 0701 - 12/10 0700 In: 2.1 [I.V.:2.1] Out: 3500  Intake/Output this shift:    General appearance: alert Resp: clear to auscultation bilaterally Chest wall: no tenderness Cardio: regular rate and rhythm, S1, S2 normal, no murmur, click, rub or gallop Extremities: edema tr  Lab Results:  Recent Labs  03/26/14 0400  WBC 4.8  HGB 10.0*  HCT 31.1*  PLT 219   BMET:  Recent Labs  03/25/14 2133 03/26/14 0400  NA 123* 129*  K 5.5* 3.9  CL 83* 90*  CO2 25 26  GLUCOSE 176* 187*  BUN 69* 23  CREATININE 5.12* 2.38*  CALCIUM 9.3 8.7   No results for input(s): PTH in the last 72 hours. Iron Studies: No results for input(s): IRON, TIBC, TRANSFERRIN, FERRITIN in the last 72 hours. Studies/Results: No results found.  Scheduled: . [START ON 03/27/2014] calcitRIOL  0.5 mcg Oral Q M,W,F-HD  . darbepoetin (ARANESP) injection - DIALYSIS  120 mcg Intravenous Q Wed-HD  . [START ON 04/01/2014] ferric gluconate (FERRLECIT/NULECIT) IV  62.5 mg Intravenous Q Wed-HD     LOS: 1 day   Argelio Granier C 03/26/2014,9:28 AM

## 2014-03-26 NOTE — Progress Notes (Signed)
Utilization Review Completed.  

## 2014-03-26 NOTE — Plan of Care (Signed)
Problem: Phase II Progression Outcomes Goal: CBGs stable on SQ insulin Outcome: Progressing Goal: Acidosis resolved (CO2 > 20, ketones negative) Outcome: Completed/Met Date Met:  03/26/14 Goal: Monitor hydration status Outcome: Completed/Met Date Met:  03/26/14 Goal: Tolerating PO clear liquid diet Outcome: Completed/Met Date Met:  03/26/14 Goal: Potassium level normalizing Outcome: Completed/Met Date Met:  03/26/14 Goal: Nausea & vomiting resolved Outcome: Not Applicable Date Met:  95/39/67

## 2014-03-26 NOTE — Progress Notes (Signed)
Pt stable upon transfer to 5W12. VS obtained and assessment completed. Pt oriented to room, call bell and phone within reach. Bed alarm on. Pt with no further questions upon transfer.

## 2014-03-26 NOTE — Progress Notes (Signed)
Labs resulted, MD notified. Order to restart insulin gtt and D5 0.45 NaCl with follow up BMET.  Insulin/IVF restarted as ordered.  Troy SineWalker, Markayla Reichart M

## 2014-03-26 NOTE — Progress Notes (Signed)
Patient returned from HD arouseable and oriented. Vitals WNL.  Insulin gtt had been discontinued while in dialysis. [name] notified of this on arrival to unit with requests for new orders.  Received order for STAT BMET to determine anion gap with further orders pending.  Will continue to monitor. Marcus [name], Marcus Rappa M

## 2014-03-26 NOTE — Progress Notes (Signed)
[facility] PROGRESS NOTE   Marcus [name] [id number] DOB: 08-17-1955 DOA: 03/25/2014 PCP: Galvin ProfferHAGUE, [name], MD  HPI/Subjective: Feels okay, denies any significant complaints. He was very unclear about how much and what type of insulin he takes in the SNF.  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 Patient started on IV intensive insulin therapy. No aggressive fluid hydration started because of patient ESRD status. Likely to transition to subcutaneous insulin today.  Hyperkalemia Patient received IV insulin as well as Kayexalate and IV calcium gluconate. Patient received emergent dialysis last night, potassium reported to be more than 7.7 in the OSH. Potassium today is 3.9.  Acute encephalopathy Secondary to hyperglycemic hyperosmolar nonketotic state, this is resolved, patient back to his baseline mentation.  Insulin-dependent diabetes mellitus Appears to be uncontrolled, check hemoglobin A1c. See above about IV insulin drip and transitioned to subcutaneous insulin on SSI coverage.  ESRD Nephrology consulted, patient already started dialysis last night for hyperkalemia.  Hypothyroidism TSH was 34.208 in February 2015, recheck TSH. Continue Synthroid for now.   Code Status: Full code Family Communication: Plan discussed with the patient. Disposition Plan: Remains inpatient   Consultants:  None  Procedures:  None  Antibiotics:  None   Objective: Filed Vitals:   03/26/14 0800  BP:  176/78  Pulse: 82  Temp: 97.9 F (36.6 C)  Resp: 12    Intake/Output Summary (Last 24 hours) at 03/26/14 0926 Last data filed at 03/26/14 0604  Gross per 24 hour  Intake    2.1 ml  Output   3500 ml  Net -3497.9 ml   Filed Weights   03/25/14 2120 03/26/14 0225 03/26/14 0402  Weight: 63.1 kg (139 lb 1.8 oz) 59.1 kg (130 lb 4.7 oz) 59.1 kg (130 lb 4.7 oz)    Exam: General: Alert and awake, oriented x3, not in any acute distress. HEENT: anicteric sclera, pupils reactive to light and accommodation, EOMI CVS: S1-S2 clear, no murmur rubs or gallops Chest: clear to auscultation bilaterally, no wheezing, rales or rhonchi Abdomen: soft nontender, nondistended, normal bowel sounds, no organomegaly Extremities: no cyanosis, clubbing or edema noted bilaterally Neuro: Cranial nerves II-XII intact, no focal neurological deficits  Data Reviewed: Basic Metabolic Panel:  Recent Labs Lab 03/25/14 2133 03/26/14 0400  NA 123* 129*  K 5.5* 3.9  CL 83* 90*  CO2 25 26  GLUCOSE 176* 187*  BUN 69* 23  CREATININE 5.12* 2.38*  CALCIUM 9.3 8.7  PHOS  --  3.2   Liver Function Tests:  Recent Labs Lab 03/26/14 0400  ALBUMIN 2.6*   No results for input(s): LIPASE, AMYLASE in the last 168 hours. No results for input(s): AMMONIA in the last 168 hours. CBC:  Recent Labs Lab 03/26/14 0400  WBC 4.8  HGB 10.0*  HCT 31.1*  MCV 85.0  PLT 219   Cardiac Enzymes: No results for input(s): CKTOTAL, CKMB, CKMBINDEX, TROPONINI in the last 168 hours. BNP (last 3 results) No results for input(s): PROBNP in the last 8760 hours. CBG:  Recent Labs Lab 03/26/14 0404 03/26/14 0520  03/26/14 0606 03/26/14 0700 03/26/14 0804  GLUCAP 172* 274* 269* 306* 312*    Micro Recent Results (from the past 240 hour(s))  MRSA PCR Screening     Status: None   Collection Time: 03/25/14  9:29 PM  Result Value Ref Range Status   MRSA by PCR NEGATIVE NEGATIVE Final    Comment:        The GeneXpert MRSA  Assay (FDA approved for NASAL specimens only), is one component of a comprehensive MRSA colonization surveillance program. It is not intended to diagnose MRSA infection nor to guide or monitor treatment for MRSA infections.      Studies: No results found.  Scheduled Meds: . [START ON 03/27/2014] calcitRIOL  0.5 mcg Oral Q M,W,F-HD  . darbepoetin (ARANESP) injection - DIALYSIS  120 mcg Intravenous Q Wed-HD  . [START ON 04/01/2014] ferric gluconate (FERRLECIT/NULECIT) IV  62.5 mg Intravenous Q Wed-HD   Continuous Infusions: . insulin (NOVOLIN-R) infusion 7.6 Units/hr (03/26/14 0814)       Time spent: 35 minutes    [facility] Hospital[facility]MAHI,Nicholas Trompeter A  [facility] Pager [phone] If 7PM-7AM, please contact night-coverage at [email or web address], password [email or web address] 03/26/2014, 9:26 AM  LOS: 1 day

## 2014-03-26 NOTE — Progress Notes (Signed)
        Needs dialysis catheter out while he is here.  He has a working right thigh graft.  He is on coumadin. And his INR is 2.76 today.    We will remove the dialysis catheter once the INR is less than 1.5.    Jeric Slagel MAUREEN PA-C

## 2014-03-27 LAB — GLUCOSE, CAPILLARY
GLUCOSE-CAPILLARY: 382 mg/dL — AB (ref 70–99)
Glucose-Capillary: 77 mg/dL (ref 70–99)
Glucose-Capillary: 244 mg/dL — ABNORMAL HIGH (ref 70–99)
Glucose-Capillary: 325 mg/dL — ABNORMAL HIGH (ref 70–99)

## 2014-03-27 MED ORDER — HYDRALAZINE HCL 20 MG/ML IJ SOLN
10.0000 mg | Freq: Four times a day (QID) | INTRAMUSCULAR | Status: DC | PRN
Start: 2014-03-27 — End: 2014-03-28
  Administered 2014-03-27: 10 mg via INTRAVENOUS
  Filled 2014-03-27: qty 1

## 2014-03-27 MED ORDER — RENA-VITE PO TABS
1.0000 | ORAL_TABLET | Freq: Every day | ORAL | Status: DC
  Administered 2014-03-27: 1 via ORAL
  Filled 2014-03-27 (×2): qty 1

## 2014-03-27 MED ORDER — INSULIN ASPART 100 UNIT/ML ~~LOC~~ SOLN
0.0000 [IU] | Freq: Every day | SUBCUTANEOUS | Status: DC
  Administered 2014-03-27: 4 [IU] via SUBCUTANEOUS

## 2014-03-27 MED ORDER — INSULIN ASPART 100 UNIT/ML ~~LOC~~ SOLN
0.0000 [IU] | Freq: Three times a day (TID) | SUBCUTANEOUS | Status: DC
  Administered 2014-03-28 (×2): 3 [IU] via SUBCUTANEOUS

## 2014-03-27 MED ORDER — LEVOTHYROXINE SODIUM 150 MCG PO TABS
150.0000 ug | ORAL_TABLET | Freq: Every day | ORAL | Status: DC
  Administered 2014-03-27 – 2014-03-28 (×2): 150 ug via ORAL
  Filled 2014-03-27 (×3): qty 1

## 2014-03-27 MED ORDER — LEVOTHYROXINE SODIUM 125 MCG PO TABS
125.0000 ug | ORAL_TABLET | Freq: Every day | ORAL | Status: DC
  Administered 2014-03-27 – 2014-03-28 (×2): 125 ug via ORAL
  Filled 2014-03-27 (×3): qty 1

## 2014-03-27 MED ORDER — INSULIN GLARGINE 100 UNIT/ML ~~LOC~~ SOLN
15.0000 [IU] | Freq: Every day | SUBCUTANEOUS | Status: DC
  Administered 2014-03-27: 15 [IU] via SUBCUTANEOUS
  Filled 2014-03-27 (×2): qty 0.15

## 2014-03-27 MED ORDER — PRAVASTATIN SODIUM 10 MG PO TABS
10.0000 mg | ORAL_TABLET | Freq: Every day | ORAL | Status: DC
  Administered 2014-03-27: 10 mg via ORAL
  Filled 2014-03-27 (×2): qty 1

## 2014-03-27 MED ORDER — FLUOXETINE HCL 20 MG PO CAPS
20.0000 mg | ORAL_CAPSULE | Freq: Every day | ORAL | Status: DC
  Administered 2014-03-27 – 2014-03-28 (×2): 20 mg via ORAL
  Filled 2014-03-27 (×2): qty 1

## 2014-03-27 NOTE — Plan of Care (Signed)
Problem: Phase I Progression Outcomes Goal: Monitor hydration status Outcome: Completed/Met Date Met:  03/27/14 Goal: Acidosis resolving Outcome: Completed/Met Date Met:  03/27/14 Goal: K+ level approaching normal with therapy Outcome: Completed/Met Date Met:  03/27/14 Goal: Voiding-avoid urinary catheter unless indicated Outcome: Not Applicable Date Met:  62/56/38

## 2014-03-27 NOTE — Progress Notes (Signed)
Medicare Important Message given?  YES (If response is "NO", the following Medicare IM given date fields will be blank) Date Medicare IM given:   Medicare IM given by:  Adlyn Fife 

## 2014-03-27 NOTE — Procedures (Signed)
He is tolerating hemodialysis and using leg AVGG without difficulty.  BFR 380, goal 2500cc.  [facility] <1.5.  Warfarin now on hold. Shayleigh Bouldin C

## 2014-03-27 NOTE — Clinical Social Work Psychosocial (Signed)
Clinical Social Work Department BRIEF PSYCHOSOCIAL ASSESSMENT 03/27/2014  Patient:  Marcus [name], Marcus [name]     Account Number:  [id number]     Admit date:  03/25/2014  Clinical Social Worker:  Lovey Newcomer  Date/Time:  03/27/2014 03:14 PM  Referred by:  Physician  Date Referred:  03/27/2014 Referred for  SNF Placement   Other Referral:   NA   Interview type:  Patient Other interview type:   Patient alert and oriented at time of assessment.    PSYCHOSOCIAL DATA Living Status:  FACILITY Admitted from facility:  [facility] [place] Level of care:  [place] Primary support name:  Marlowe Kays Primary support relationship to patient:  PARENT Degree of support available:   Support is good.    CURRENT CONCERNS Current Concerns  Post-Acute Placement   Other Concerns:   NA    SOCIAL WORK ASSESSMENT / PLAN CSW met with patient at bedside to complete assessment. Patient states that he was admitted from [facility] [place] and he plans to return to the facility at discharge. CSW has contact the facility and they are prepared to take the patient at discharge and can assist with weekend discharge if necessary. Patient states that he is happy with the care at [facility]. Patient appeared calm and engaged in assessment.   Assessment/plan status:  Psychosocial Support/Ongoing Assessment of Needs Other assessment/ plan:   Complete Fl2, Fax, PASRR   Information/referral to community resources:   CSW contact information and SNF list given.    PATIENT'S/FAMILY'S RESPONSE TO PLAN OF CARE: Patient plans to return to [facility] at discharge. CSW will assist.       Liz Beach MSW, [place], [place], [phone]

## 2014-03-27 NOTE — Progress Notes (Signed)
[facility] PROGRESS NOTE   Marcus MuttersCharles O [name] [id number] DOB: Oct 16, 1955 DOA: 03/25/2014 PCP: Marcus [name]  HPI/Subjective: Denies any complaints, fasting blood sugar was 187.  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 Patient started on IV intensive insulin therapy. No aggressive fluid hydration started because of patient ESRD status. Restarted on 5 units of insulin BID, I will switch to 15 units daily at bedtime  Hyperkalemia Patient received IV insulin as well as Kayexalate and IV calcium gluconate. Patient received emergent dialysis last night, potassium reported to be more than 7.7 in the OSH. Potassium today is 3.9.  Acute encephalopathy Secondary to hyperglycemic hyperosmolar nonketotic state, this is resolved, patient back to his baseline mentation.  Insulin-dependent diabetes mellitus Appears to be uncontrolled, A1c is pending Will switch to daily at bedtime Lantus  ESRD Nephrology consulted, patient already started dialysis last night for hyperkalemia.  Hypothyroidism TSH was 34.208 in February 2015, recheck TSH. TSH is 10.360, continue Synthroid huge dose of 275 mg. Patient is being worked up before for pituitary adenoma.   Code Status: Full code Family Communication: Plan discussed with the patient. Disposition Plan: Remains inpatient   Consultants:  None  Procedures:  None  Antibiotics:  None   Objective: Filed Vitals:   03/27/14 1130  BP: 176/94  Pulse: 88    Temp:   Resp:     Intake/Output Summary (Last 24 hours) at 03/27/14 1158 Last data filed at 03/26/14 2100  Gross per 24 hour  Intake  276.3 ml  Output      0 ml  Net  276.3 ml   Filed Weights   03/26/14 0402 03/27/14 0500 03/27/14 0933  Weight: 59.1 kg (130 lb 4.7 oz) 61 kg (134 lb 7.7 oz) 61.4 kg (135 lb 5.8 oz)    Exam: General: Alert and awake, oriented x3, not in any acute distress. HEENT: anicteric sclera, pupils reactive to light and accommodation, EOMI CVS: S1-S2 clear, no murmur rubs or gallops Chest: clear to auscultation bilaterally, no wheezing, rales or rhonchi Abdomen: soft nontender, nondistended, normal bowel sounds, no organomegaly Extremities: no cyanosis, clubbing or edema noted bilaterally Neuro: Cranial nerves II-XII intact, no focal neurological deficits  Data Reviewed: Basic Metabolic Panel:  Recent Labs Lab 03/25/14 2133 03/26/14 0400 03/26/14 0944  NA 123* 129* 131*  K 5.5* 3.9 4.6  CL 83* 90* 92*  CO2 25 26 26   GLUCOSE 176* 187* 313*  BUN 69* 23 25*  CREATININE 5.12* 2.38* 2.66*  CALCIUM 9.3 8.7 8.8  PHOS  --  3.2  --    Liver Function Tests:  Recent Labs Lab 03/26/14 0400  ALBUMIN 2.6*   No results for input(s): LIPASE, AMYLASE in the last 168 hours. No results for input(s): AMMONIA in the last 168 hours. CBC:  Recent Labs Lab 03/26/14 0400  WBC 4.8  HGB 10.0*  HCT 31.1*  MCV 85.0  PLT 219   Cardiac Enzymes: No results for input(s): CKTOTAL, CKMB, CKMBINDEX, TROPONINI in the last 168 hours. BNP (last 3 results) No results for input(s):  PROBNP in the last 8760 hours. CBG:  Recent Labs Lab 03/26/14 1113 03/26/14 1247 03/26/14 1631 03/26/14 2128 03/27/14 0838  GLUCAP 183* 104* 140* 103* 382*    Micro Recent Results (from the past 240 hour(s))  MRSA PCR Screening     Status: None   Collection Time: 03/25/14  9:29 PM  Result Value Ref Range Status   MRSA by PCR NEGATIVE NEGATIVE Final    Comment:        The  GeneXpert MRSA Assay (FDA approved for NASAL specimens only), is one component of [name] comprehensive MRSA colonization surveillance program. It is not intended to diagnose MRSA infection nor to guide or monitor treatment for MRSA infections.      Studies: No results found.  Scheduled Meds: . calcitRIOL  0.5 mcg Oral Q M,W,F-HD  . darbepoetin (ARANESP) injection - DIALYSIS  120 mcg Intravenous Q Wed-HD  . feeding supplement (NEPRO CARB STEADY)  237 mL Oral BID BM  . [START ON 04/01/2014] ferric gluconate (FERRLECIT/NULECIT) IV  62.5 mg Intravenous Q Wed-HD  . insulin aspart  0-9 Units Subcutaneous TID WC  . insulin glargine  5 Units Subcutaneous BID   Continuous Infusions:       Time spent: 35 minutes    [facility] LLCELMAHI,Marcus [name]  [facility] Pager [phone] If 7PM-7AM, please contact night-coverage at [email or web address], password [email or web address] 03/27/2014, 11:58 AM  LOS: 2 days

## 2014-03-28 LAB — BASIC METABOLIC PANEL
Anion gap: 15 (ref 5–15)
BUN: 19 mg/dL (ref 6–23)
CHLORIDE: 93 meq/L — AB (ref 96–112)
CO2: 24 meq/L (ref 19–32)
CREATININE: 2.82 mg/dL — AB (ref 0.50–1.35)
Calcium: 8.8 mg/dL (ref 8.4–10.5)
GFR calc Af Amer: 27 mL/min — ABNORMAL LOW (ref >90)
GFR calc non Af Amer: 23 mL/min — ABNORMAL LOW (ref >90)
GLUCOSE: 83 mg/dL (ref 70–99)
Potassium: 3.9 mEq/L (ref 3.7–5.3)
Sodium: 132 mEq/L — ABNORMAL LOW (ref 137–147)

## 2014-03-28 LAB — GLUCOSE, CAPILLARY
Glucose-Capillary: 71 mg/dL (ref 70–99)
Glucose-Capillary: 238 mg/dL — ABNORMAL HIGH (ref 70–99)
Glucose-Capillary: 210 mg/dL — ABNORMAL HIGH (ref 70–99)

## 2014-03-28 LAB — HEMOGLOBIN A1C
Hgb A1c MFr Bld: 8.6 % — ABNORMAL HIGH (ref <5.7)
MEAN PLASMA GLUCOSE: 200 mg/dL — AB (ref <117)

## 2014-03-28 LAB — PROTIME-INR
INR: 1.56 — ABNORMAL HIGH (ref 0.00–1.49)
PROTHROMBIN TIME: 18.8 s — AB (ref 11.6–15.2)

## 2014-03-28 MED ORDER — INSULIN GLARGINE 100 UNIT/ML ~~LOC~~ SOLN: 15.0000 [IU] | Freq: Every day | SUBCUTANEOUS | Status: DC

## 2014-03-28 MED ORDER — INSULIN ASPART 100 UNIT/ML ~~LOC~~ SOLN: 5.0000 [IU] | Freq: Three times a day (TID) | SUBCUTANEOUS | Status: AC

## 2014-03-28 NOTE — Discharge Summary (Addendum)
Physician Discharge Summary  ALANI LACIVITA BJY:[id number] DOB: 14-Nov-1955 DOA: 03/25/2014  PCP: Galvin Proffer, MD  Admit date: 03/25/2014 Discharge date: 03/28/2014  Time spent: 40 minutes  Recommendations for Outpatient Follow-up:  1. Follow-up with primary care physician within one week. 2. Consider referral to endocrinology as outpatient. 3. Follow-up with nephrology and vascular surgery as outpatient for dialysis catheter removal.  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 male with history of IDDM, ESRD on HD (MWF) , HTN, Hypothyroid, COPD, who presented to the ED at [facility] with complaints of increased confusion and was found to have hyperglycemia with a glucose of 619, and electrolyte imbalances of hyperkalemia (K+ =7.0) with Hyponatremia (Na+ = 120). He missed his dialysis today and was last dialyzed on Monday He was administered IV Calcium Gluconate, and IV Insulin and oral Kayexalate x1, and the EDP spoke with [employer] and [facility] and transfered him to [facility] was notified when patient arrived and is to have Emergent dialysis this evening.   Hospital Course:    Hyperglycemic hyperosmolar nonketotic state Patient presented with blood glucose of 619, with no  evidence of acidosis. Patient started on IV intensive insulin therapy. No aggressive fluid hydration started because of patient ESRD status. On discharge insulin regimen adjusted, patient will be on 15 units of Lantus at bedtime, 5 units of NovoLog with meals.  Hyperkalemia Patient received IV insulin as well as Kayexalate and IV calcium gluconate. Patient received emergent dialysis last night, potassium reported to be more than 7.7 in the OSH. Potassium today is 3.9.  Acute encephalopathy Secondary to hyperglycemic hyperosmolar nonketotic state, this is resolved, patient back to his baseline mentation.  Insulin-dependent diabetes mellitus Uncontrolled diabetes mellitus, with hemoglobin A1c is 8.6, correlating with mean plasma glucose of 200. Lantus insulin increased to 15 units at night and 5 units of NovoLog with meals.  ESRD Nephrology consulted, patient already placed back on his hemodialysis schedule.  Hypothyroidism TSH was 34.208 in February 2015, recheck TSH. TSH is 10.360, patient is on huge doses of 275 g of Synthroid, continue. Patient had been worked up before for pituitary adenoma. Consider referral to endocrine as outpatient. Please check TSH in 6 weeks around the last week of January 2016.  Anticoagulation with warfarin Patient is taking warfarin for history of DVT, unclear when he was diagnosed with it. Patient seen by the vascular surgery PA and they were planning to discontinue dialysis catheter (as he has working graft). Please follow-up with nephrology and vascular surgery as outpatient, he might need to stop Coumadin for good. Follow-up for catheter removal, on discharge Coumadin restarted.  Procedures:  None  Consultations:  Nephrology  Discharge Exam: Filed Vitals:   03/28/14 0528  BP: 144/68  Pulse: 87  Temp: 98.6 F (37  C)  Resp: 20   General: Alert and awake, oriented x3, not in any acute distress. HEENT: anicteric sclera, pupils reactive to  light and accommodation, EOMI CVS: S1-S2 clear, no murmur rubs or gallops Chest: clear to auscultation bilaterally, no wheezing, rales or rhonchi Abdomen: soft nontender, nondistended, normal bowel sounds, no organomegaly Extremities: no cyanosis, clubbing or edema noted bilaterally Neuro: Cranial nerves II-XII intact, no focal neurological deficits.  Discharge Instructions You were cared for by a hospitalist during your hospital stay. If you have any questions about your discharge medications or the care you received while you were in the hospital after you are discharged, you can call the unit and asked to speak with the hospitalist on call if the hospitalist that took care of you is not available. Once you are discharged, your primary care physician will handle any further medical issues. Please note that NO REFILLS for any discharge medications will be authorized once you are discharged, as it is imperative that you return to your primary care physician (or establish a relationship with a primary care physician if you do not have one) for your aftercare needs so that they can reassess your need for medications and monitor your lab values.  Discharge Instructions    Diet Carb Modified    Complete by:  As directed      Increase activity slowly    Complete by:  As directed           Current Discharge Medication List    CONTINUE these medications which have CHANGED   Details  insulin aspart (NOVOLOG) 100 UNIT/ML injection Inject 5 Units into the skin 3 (three) times daily with meals. Qty: 10 mL, Refills: 11    insulin glargine (LANTUS) 100 UNIT/ML injection Inject 0.15 mLs (15 Units total) into the skin at bedtime. Qty: 10 mL, Refills: 11      CONTINUE these medications which have NOT CHANGED   Details  albuterol (VENTOLIN HFA) 108 (90 BASE) MCG/ACT inhaler Inhale 2 puffs into the lungs every 4 (four) hours as needed for wheezing or shortness of breath.    amLODipine (NORVASC) 10 MG  tablet Take 1 tablet (10 mg total) by mouth at bedtime. Qty: 30 tablet, Refills: 2    calcitRIOL (ROCALTROL) 0.25 MCG capsule Take 1 capsule (0.25 mcg total) by mouth every Monday, Wednesday, and Friday. Qty: 30 capsule, Refills: 1    docusate sodium (COLACE) 100 MG capsule Take 1 capsule (100 mg total) by mouth 2 (two) times daily. Qty: 10 capsule, Refills: 0    ferrous sulfate 325 (65 FE) MG tablet Take 325 mg by mouth 2 (two) times daily with a meal.    FLUoxetine (PROZAC) 20 MG capsule Take 20 mg by mouth daily.    !! levothyroxine (SYNTHROID, LEVOTHROID) 125 MCG tablet Take 125 mcg by mouth daily before breakfast.    !! levothyroxine (SYNTHROID, LEVOTHROID) 150 MCG tablet Take 150 mcg by mouth daily before breakfast.    metoprolol (LOPRESSOR) 50 MG tablet Take 1 tablet (50 mg total) by mouth 2 (two) times daily. Qty: 60 tablet, Refills: 1    multivitamin (RENA-VIT) TABS tablet Take 1 tablet by mouth daily.    Nutritional Supplements (NUTRITIONAL SHAKE PO) Take 1 Bottle by mouth 3 (three) times daily with meals.    ondansetron (ZOFRAN) 4 MG tablet Take 4 mg by mouth every 6 (six) hours as needed for nausea or vomiting.    oxyCODONE (OXY IR/ROXICODONE) 5 MG immediate release tablet Take  5 mg by mouth every 6 (six) hours as needed for severe pain.    pravastatin (PRAVACHOL) 10 MG tablet Take 1 tablet (10 mg total) by mouth at bedtime. Qty: 30 tablet, Refills: 1    promethazine (PHENADOZ) 25 MG suppository Place 25 mg rectally every 8 (eight) hours as needed for nausea or vomiting.    traZODone (DESYREL) 50 MG tablet Take 25 mg by mouth at bedtime.    warfarin (COUMADIN) 2.5 MG tablet Take 2.5 mg by mouth daily.     !! - Potential duplicate medications found. Please discuss with provider.    STOP taking these medications     FLUoxetine (PROZAC) 20 MG/5ML solution      oxyCODONE-acetaminophen (PERCOCET/ROXICET) 5-325 MG per tablet        Allergies  Allergen Reactions   . Penicillins Other (See Comments)    Unknown reaction   Follow-up Information    Follow up with [name], Myrene GalasIMRAN P, MD In 1 week.   Specialty:  Internal Medicine   Contact information:   [address] [place] [address] [phone]        The results of significant diagnostics from this hospitalization (including imaging, microbiology, ancillary and laboratory) are listed below for reference.    Significant Diagnostic Studies: No results found.  Microbiology: Recent Results (from the past 240 hour(s))  MRSA PCR Screening     Status: None   Collection Time: 03/25/14  9:29 PM  Result Value Ref Range Status   MRSA by PCR NEGATIVE NEGATIVE Final    Comment:        The GeneXpert MRSA Assay (FDA approved for NASAL specimens only), is one component of a comprehensive MRSA colonization surveillance program. It is not intended to diagnose MRSA infection nor to guide or monitor treatment for MRSA infections.      Labs: Basic Metabolic Panel:  Recent Labs Lab 03/25/14 2133 03/26/14 0400 03/26/14 0944 03/28/14 0523  NA 123* 129* 131* 132*  K 5.5* 3.9 4.6 3.9  CL 83* 90* 92* 93*  CO2 25 26 26 24   GLUCOSE 176* 187* 313* 83  BUN 69* 23 25* 19  CREATININE 5.12* 2.38* 2.66* 2.82*  CALCIUM 9.3 8.7 8.8 8.8  PHOS  --  3.2  --   --    Liver Function Tests:  Recent Labs Lab 03/26/14 0400  ALBUMIN 2.6*   No results for input(s): LIPASE, AMYLASE in the last 168 hours. No results for input(s): AMMONIA in the last 168 hours. CBC:  Recent Labs Lab 03/26/14 0400  WBC 4.8  HGB 10.0*  HCT 31.1*  MCV 85.0  PLT 219   Cardiac Enzymes: No results for input(s): CKTOTAL, CKMB, CKMBINDEX, TROPONINI in the last 168 hours. BNP: BNP (last 3 results) No results for input(s): PROBNP in the last 8760 hours. CBG:  Recent Labs Lab 03/27/14 0838 03/27/14 1452 03/27/14 1647 03/27/14 2105 03/28/14 0801  GLUCAP 382* 77 244* 325* 71        Signed:  Wojciech Willetts A  [facility] 03/28/2014, 9:24 AM

## 2014-03-28 NOTE — Clinical Social Work Note (Signed)
CSW made aware patient is ready for d/c to [facility] [place] by MD Hartford Poli). CSW contacted facility and spoke with Fransisco Beau. Fransisco Beau confirmed bed availability. CSW met with patient who is agreeable to d/c plan and gave CSW permission to contact his mother. CSW contacted patient's mother Vira Agar [phone]) who is agreeable to d/c plan. CSW faxed d/c summary to facility and prepared d/c packet. CSW placed d/c packet in patient's shadow chart and provided RN with number for report [phone], station 2 RN). CSW to arrange transportation via [place]. No further needs. CSW signing off.   [place], [place] Weekend Clinical Social Worker [phone]

## 2014-03-28 NOTE — Progress Notes (Signed)
NURSING PROGRESS NOTE  Marcus MuttersCharles O Carmer [id number] Discharge Data: 03/28/2014 5:53 PM Attending Provider: Clydia LlanoMutaz Elmahi, MD OZH:[id number]:[name], Myrene GalasIMRAN P, MD     Marcus [name] to be D/C'd Skilled nursing facility per MD order.  Discussed with the patient the After Visit Summary and all questions fully answered. All IV's discontinued with no bleeding noted. All belongings returned to patient for patient to take home.   Last Vital Signs:  Blood pressure 163/80, pulse 89, temperature 98 F (36.7 C), temperature source Axillary, resp. rate 19, height 6\' 2"  (1.88 m), weight 59.5 kg (131 lb 2.8 oz), SpO2 100 %.  Discharge Medication List   Medication List    STOP taking these medications        oxyCODONE-acetaminophen 5-325 MG per tablet  Commonly known as:  PERCOCET/ROXICET      TAKE these medications        amLODipine 10 MG tablet  Commonly known as:  NORVASC  Take 1 tablet (10 mg total) by mouth at bedtime.     calcitRIOL 0.25 MCG capsule  Commonly known as:  ROCALTROL  Take 1 capsule (0.25 mcg total) by mouth every Monday, Wednesday, and Friday.     docusate sodium 100 MG capsule  Commonly known as:  COLACE  Take 1 capsule (100 mg total) by mouth 2 (two) times daily.     ferrous sulfate 325 (65 FE) MG tablet  Take 325 mg by mouth 2 (two) times daily with a meal.     FLUoxetine 20 MG capsule  Commonly known as:  PROZAC  Take 20 mg by mouth daily.     insulin aspart 100 UNIT/ML injection  Commonly known as:  novoLOG  Inject 5 Units into the skin 3 (three) times daily with meals.     insulin glargine 100 UNIT/ML injection  Commonly known as:  LANTUS  Inject 0.15 mLs (15 Units total) into the skin at bedtime.     levothyroxine 125 MCG tablet  Commonly known as:  SYNTHROID, LEVOTHROID  Take 125 mcg by mouth daily before breakfast.     levothyroxine 150 MCG tablet  Commonly known as:  SYNTHROID, LEVOTHROID  Take 150 mcg by mouth daily before breakfast.     metoprolol 50 MG  tablet  Commonly known as:  LOPRESSOR  Take 1 tablet (50 mg total) by mouth 2 (two) times daily.     multivitamin Tabs tablet  Take 1 tablet by mouth daily.     NUTRITIONAL SHAKE PO  Take 1 Bottle by mouth 3 (three) times daily with meals.     ondansetron 4 MG tablet  Commonly known as:  ZOFRAN  Take 4 mg by mouth every 6 (six) hours as needed for nausea or vomiting.     oxyCODONE 5 MG immediate release tablet  Commonly known as:  Oxy IR/ROXICODONE  Take 5 mg by mouth every 6 (six) hours as needed for severe pain.     PHENADOZ 25 MG suppository  Generic drug:  promethazine  Place 25 mg rectally every 8 (eight) hours as needed for nausea or vomiting.     pravastatin 10 MG tablet  Commonly known as:  PRAVACHOL  Take 1 tablet (10 mg total) by mouth at bedtime.     traZODone 50 MG tablet  Commonly known as:  DESYREL  Take 25 mg by mouth at bedtime.     VENTOLIN HFA 108 (90 BASE) MCG/ACT inhaler  Generic drug:  albuterol  Inhale 2 puffs into the lungs  every 4 (four) hours as needed for wheezing or shortness of breath.     warfarin 2.5 MG tablet  Commonly known as:  COUMADIN  Take 2.5 mg by mouth daily.

## 2014-03-28 NOTE — Progress Notes (Signed)
Plans for discharge noted.  He is taking low dose warfarin for uncertain reasons, perhaps catheter patency.  We are not sure. [facility] and we will try to investigate the anticoagulant question further. Marcus [name]

## 2014-04-02 NOTE — Care Management Note (Signed)
    Page 1 of 1   04/02/2014     8:07:24 AM CARE MANAGEMENT NOTE 04/02/2014  Patient:  Vicie MuttersSTALEY,Larz O   Account Number:  [id number]  Date Initiated:  04/02/2014  Documentation initiated by:  Letha CapeAYLOR,Alira Fretwell  Subjective/Objective Assessment:   dx hyperglyemia,  admit     Action/Plan:   Anticipated DC Date:  03/28/2014   Anticipated DC Plan:  SKILLED NURSING FACILITY  In-house referral  Clinical Social Worker      DC Planning Services  CM consult      Choice offered to / List presented to:             Status of service:  Completed, signed off Medicare Important Message given?  YES (If response is "NO", the following Medicare IM given date fields will be blank) Date Medicare IM given:  03/27/2014 Medicare IM given by:  Letha CapeAYLOR,Biridiana Twardowski Date Additional Medicare IM given:   Additional Medicare IM given by:    Discharge Disposition:  SKILLED NURSING FACILITY  Per UR Regulation:  Reviewed for med. necessity/level of care/duration of stay  If discussed at Long Length of Stay Meetings, dates discussed:    Comments:

## 2014-06-07 IMAGING — CR DG CHEST 1V PORT
1 series · 1 of 1 positions shown · non-contrast
Comparison: CT ANGIO CHEST dated 05/19/2013; DG CHEST PORTABLE dated
05/15/2013

CLINICAL DATA: Shortness of breath.  DVT.

EXAM:
PORTABLE CHEST - 1 VIEW

[AP]
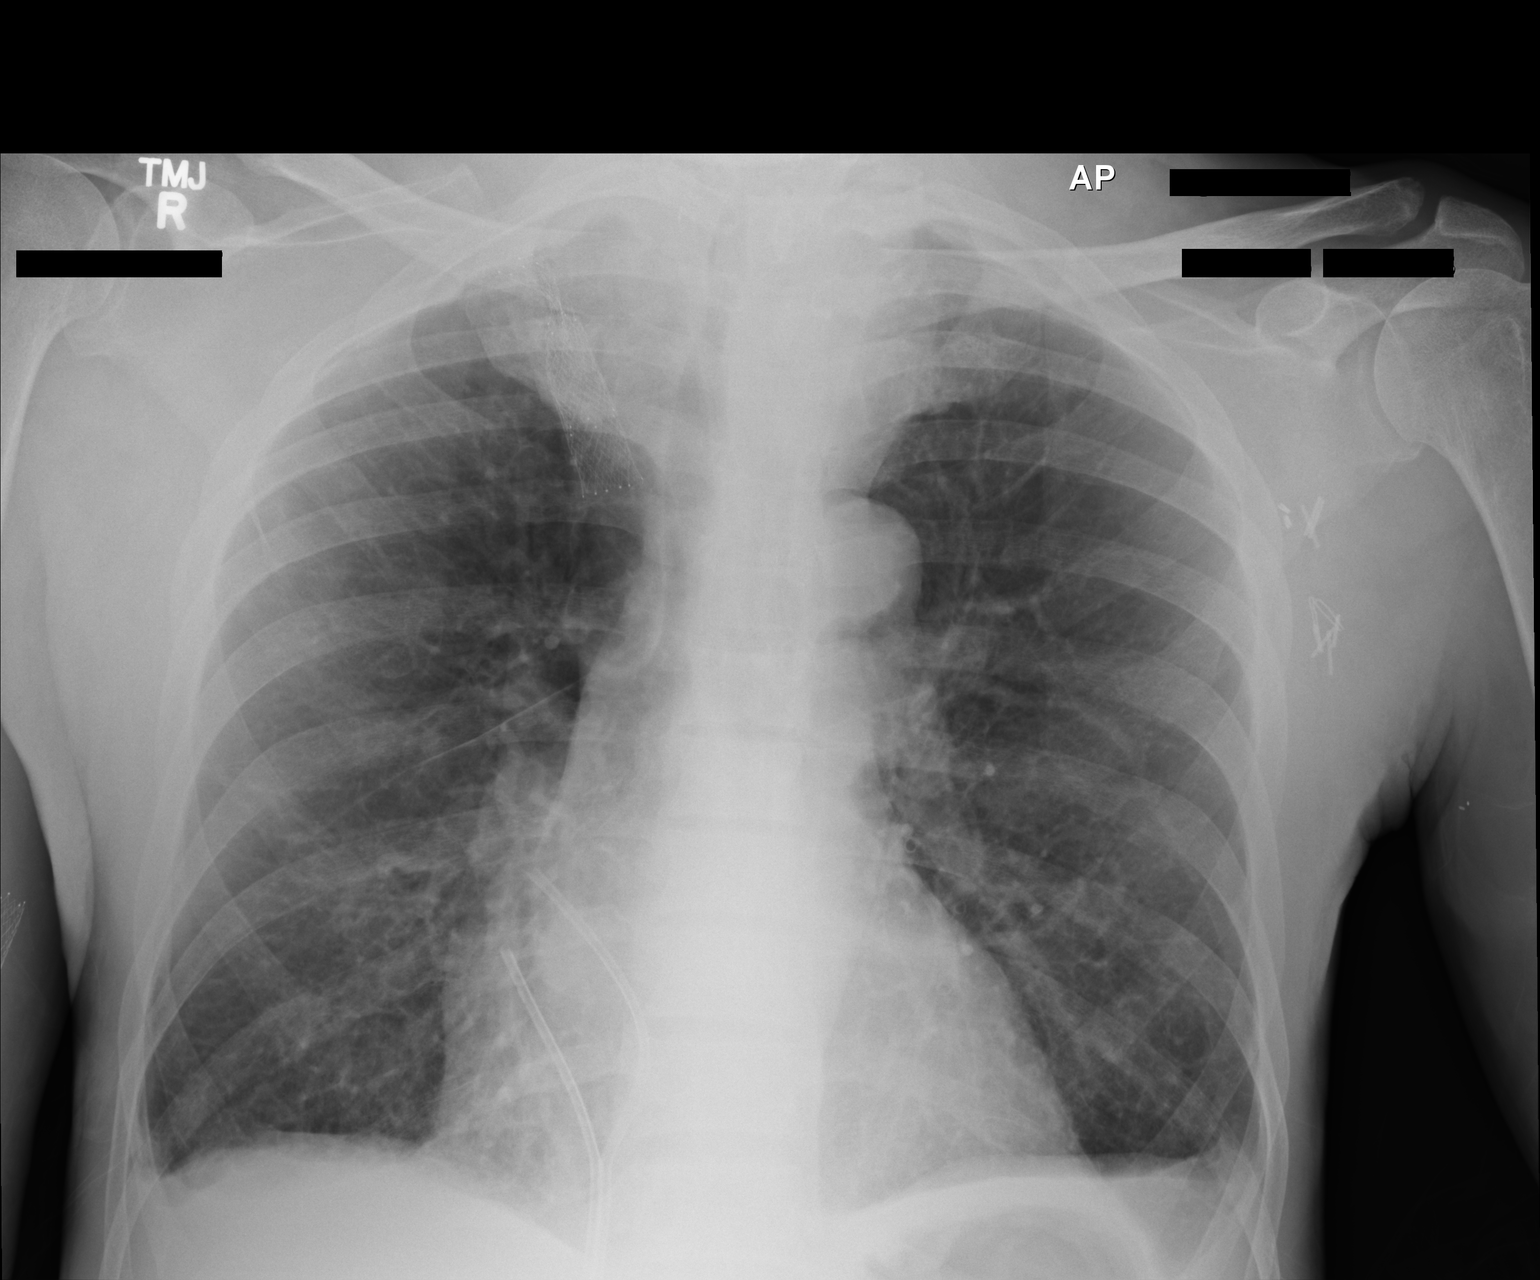

[1 of 1 positions shown; findings below may reference images not displayed]

FINDINGS: 7616 hr. The heart size and mediastinal contours are stable. There
is superior mediastinal widening corresponding with thyromegaly on
CT. Right brachiocephalic venous stent is noted, occluded on CT.
Dialysis catheters are present within the right atrium, inserted
inferiorly. The lungs are clear. There is stable blunting of the
left costophrenic angle. No pleural effusion or pneumothorax is
identified.
IMPRESSION: Stable postoperative findings with chronic blunting of the left
costophrenic angle. No acute cardiopulmonary process.

## 2014-06-10 IMAGING — US US SOFT TISSUE HEAD/NECK
1 series · 14 of 25 positions shown · non-contrast
Comparison: CT 05/13/2013

CLINICAL DATA: Neck fullness. Thyromegaly on previous neck CT.
History of thyroidectomy.

EXAM:
THYROID ULTRASOUND
TECHNIQUE: Ultrasound examination of the thyroid gland and adjacent soft
tissues was performed.

[Series 1: us soft tissue head/neck · 0.07mm/px · 14 of 42 slices shown]
[im 1/42]
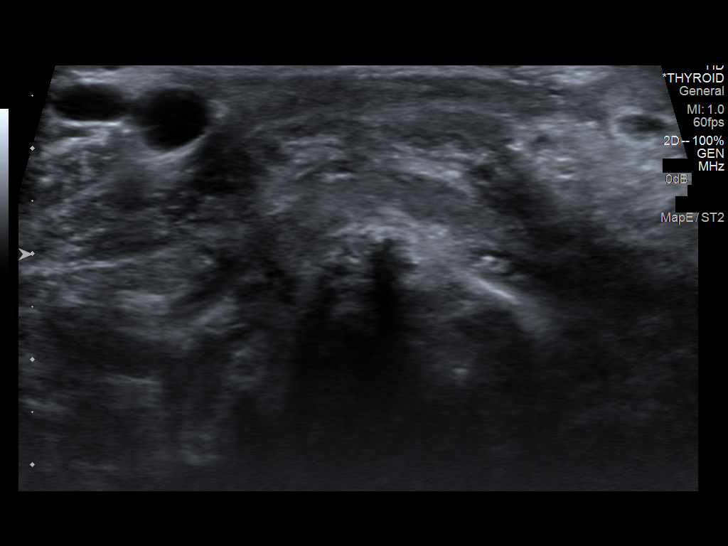
[im 4/42]
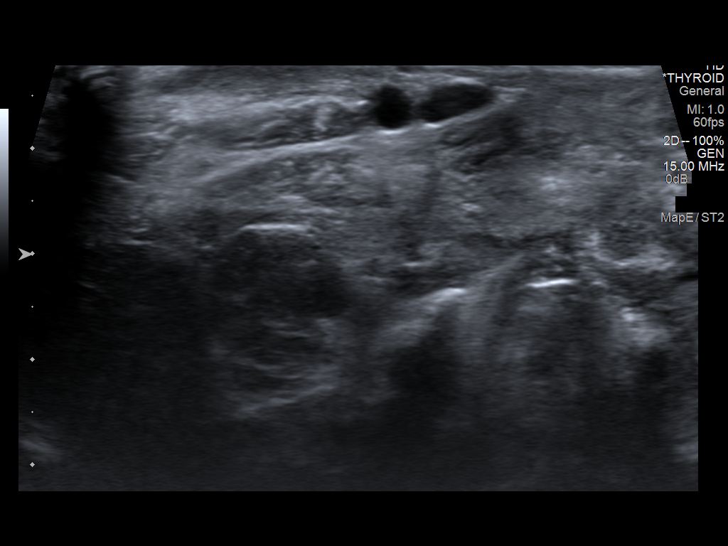
[im 7/42]
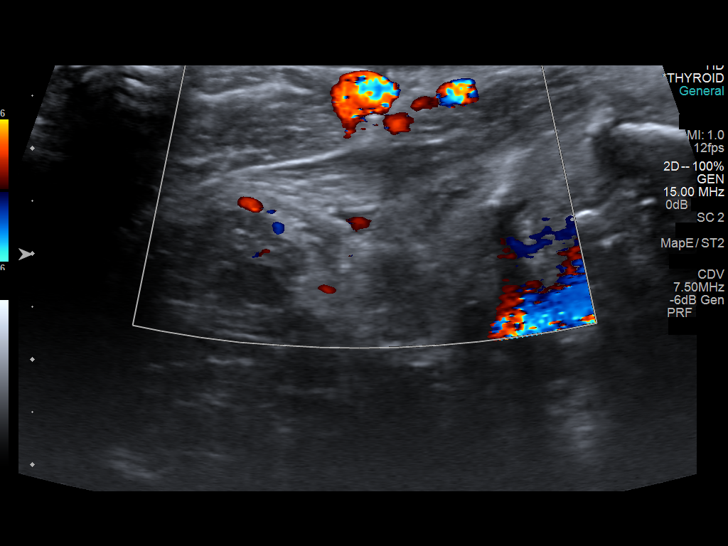
[im 11/42]
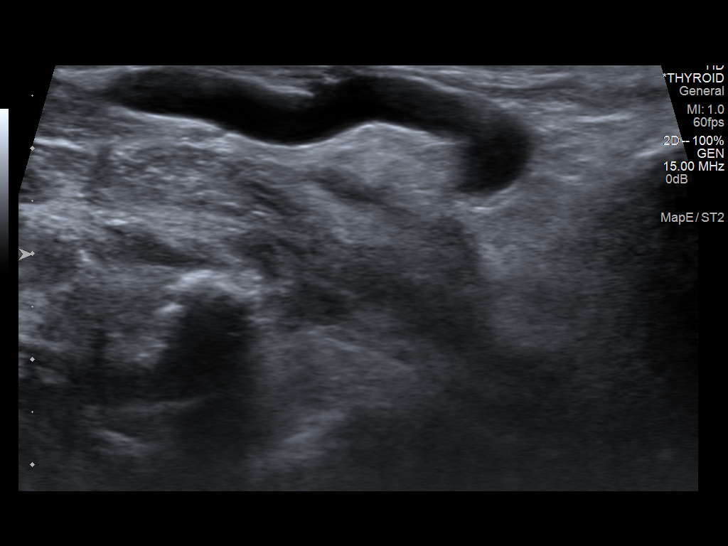
[im 14/42]
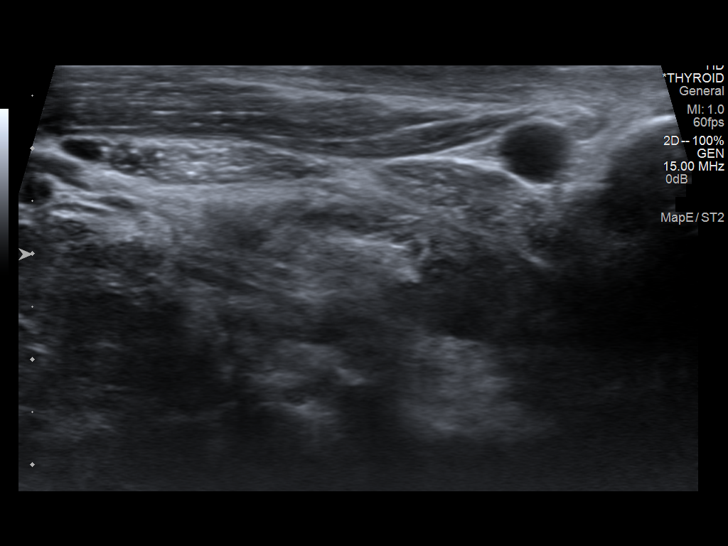
[im 16/42]
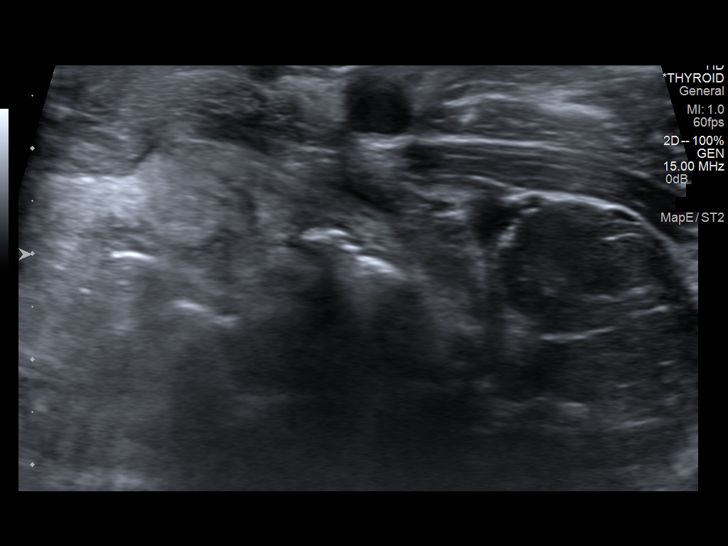
[im 19/42]
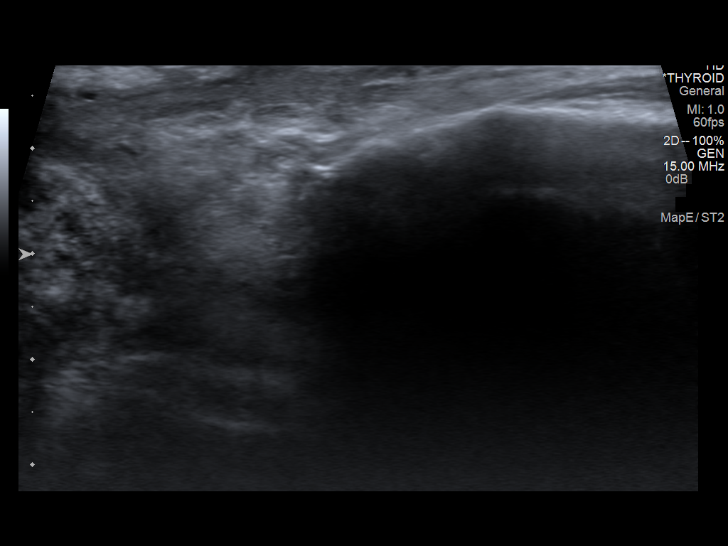
[im 23/42]
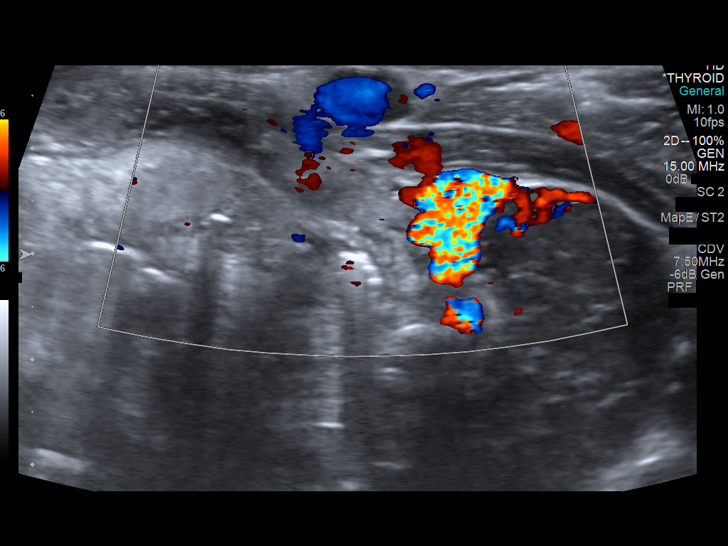
[im 26/42]
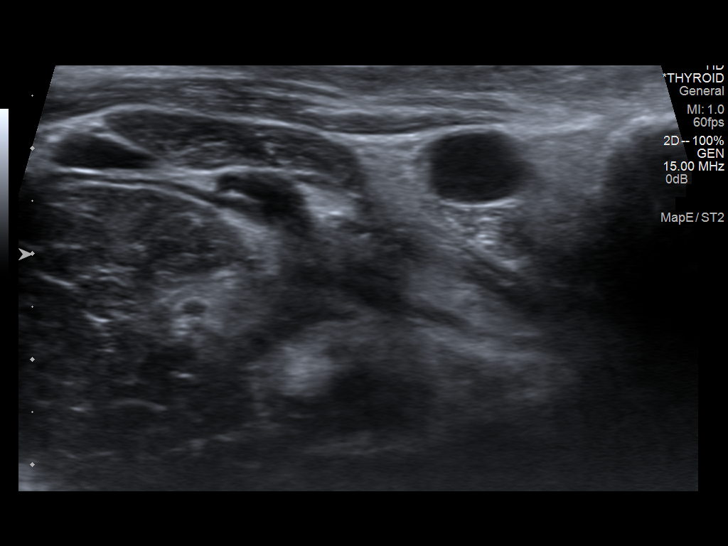
[im 28/42]
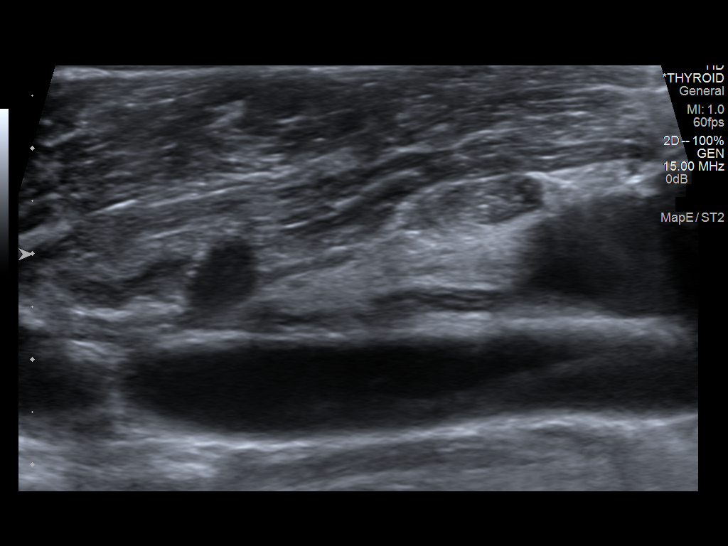
[im 31/42]
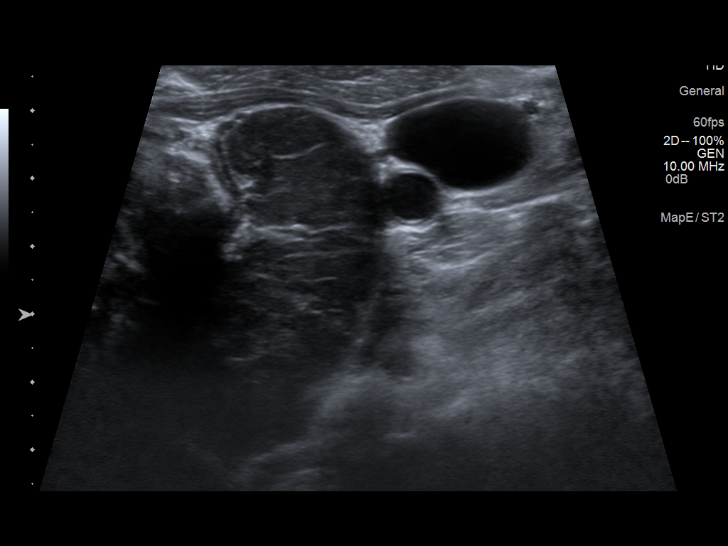
[im 35/42]
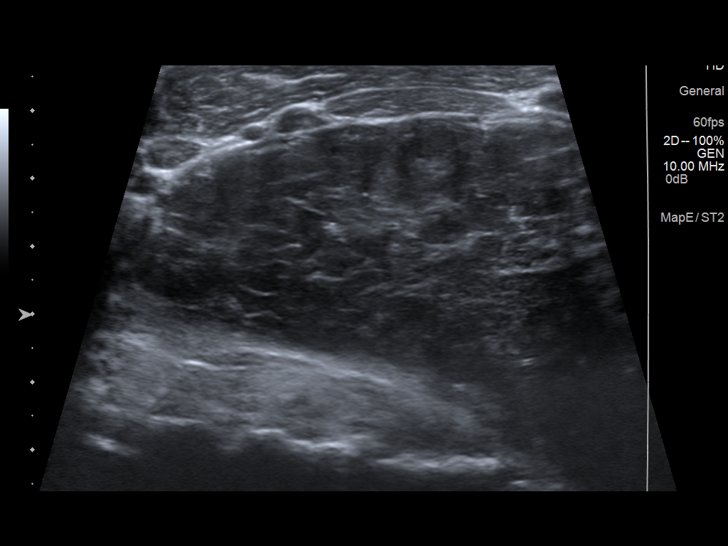
[im 38/42]
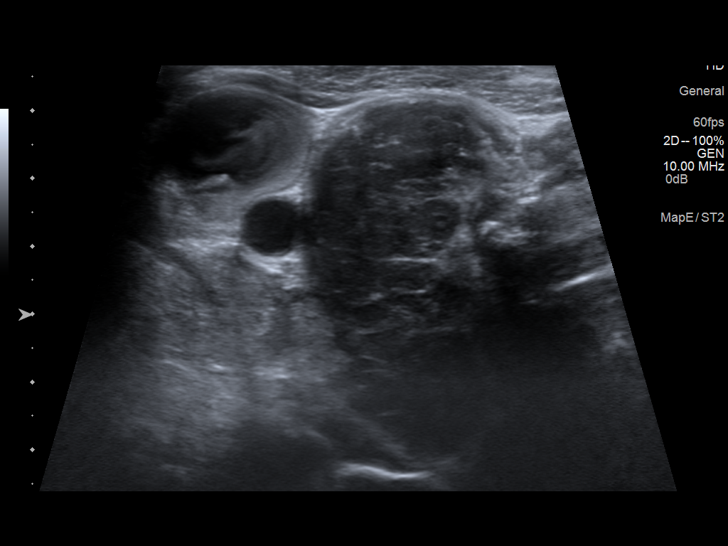
[im 42/42]
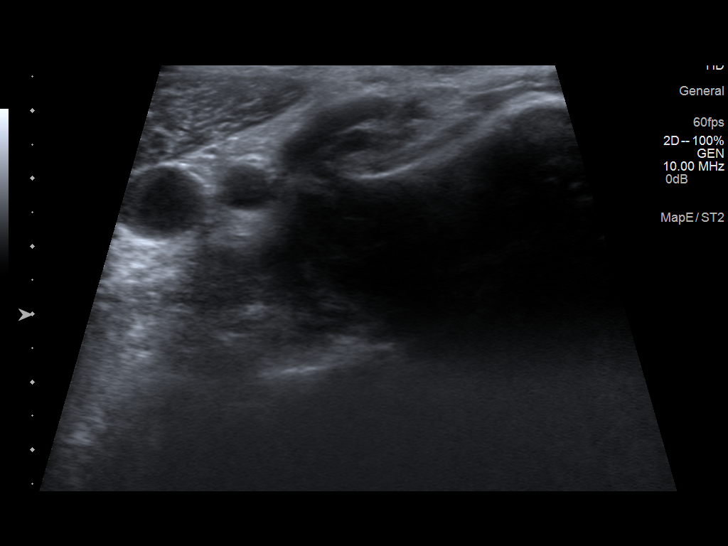

[14 of 25 positions shown; findings below may reference images not displayed]

FINDINGS: Right thyroid lobe

Measurements: 86 x 46 x 31 mm. Heterogeneous echotexture with
hyperemia on color Doppler.

Left thyroid lobe

Measurements: 113 x 49 x 29 mm. Hyperemic; heterogeneous echotexture

Isthmus

Not discretely demarcated

Lymphadenopathy

None visualized.
IMPRESSION: Marked thyromegaly without dominant nodule or lesion.

## 2015-02-19 IMAGING — CR DG CHEST 1V PORT
1 series · 1 of 1 positions shown · non-contrast
Comparison: 01/30/2014

CLINICAL DATA: Aspiration pneumonia subsequent evaluation

EXAM:
PORTABLE CHEST - 1 VIEW

[AP]
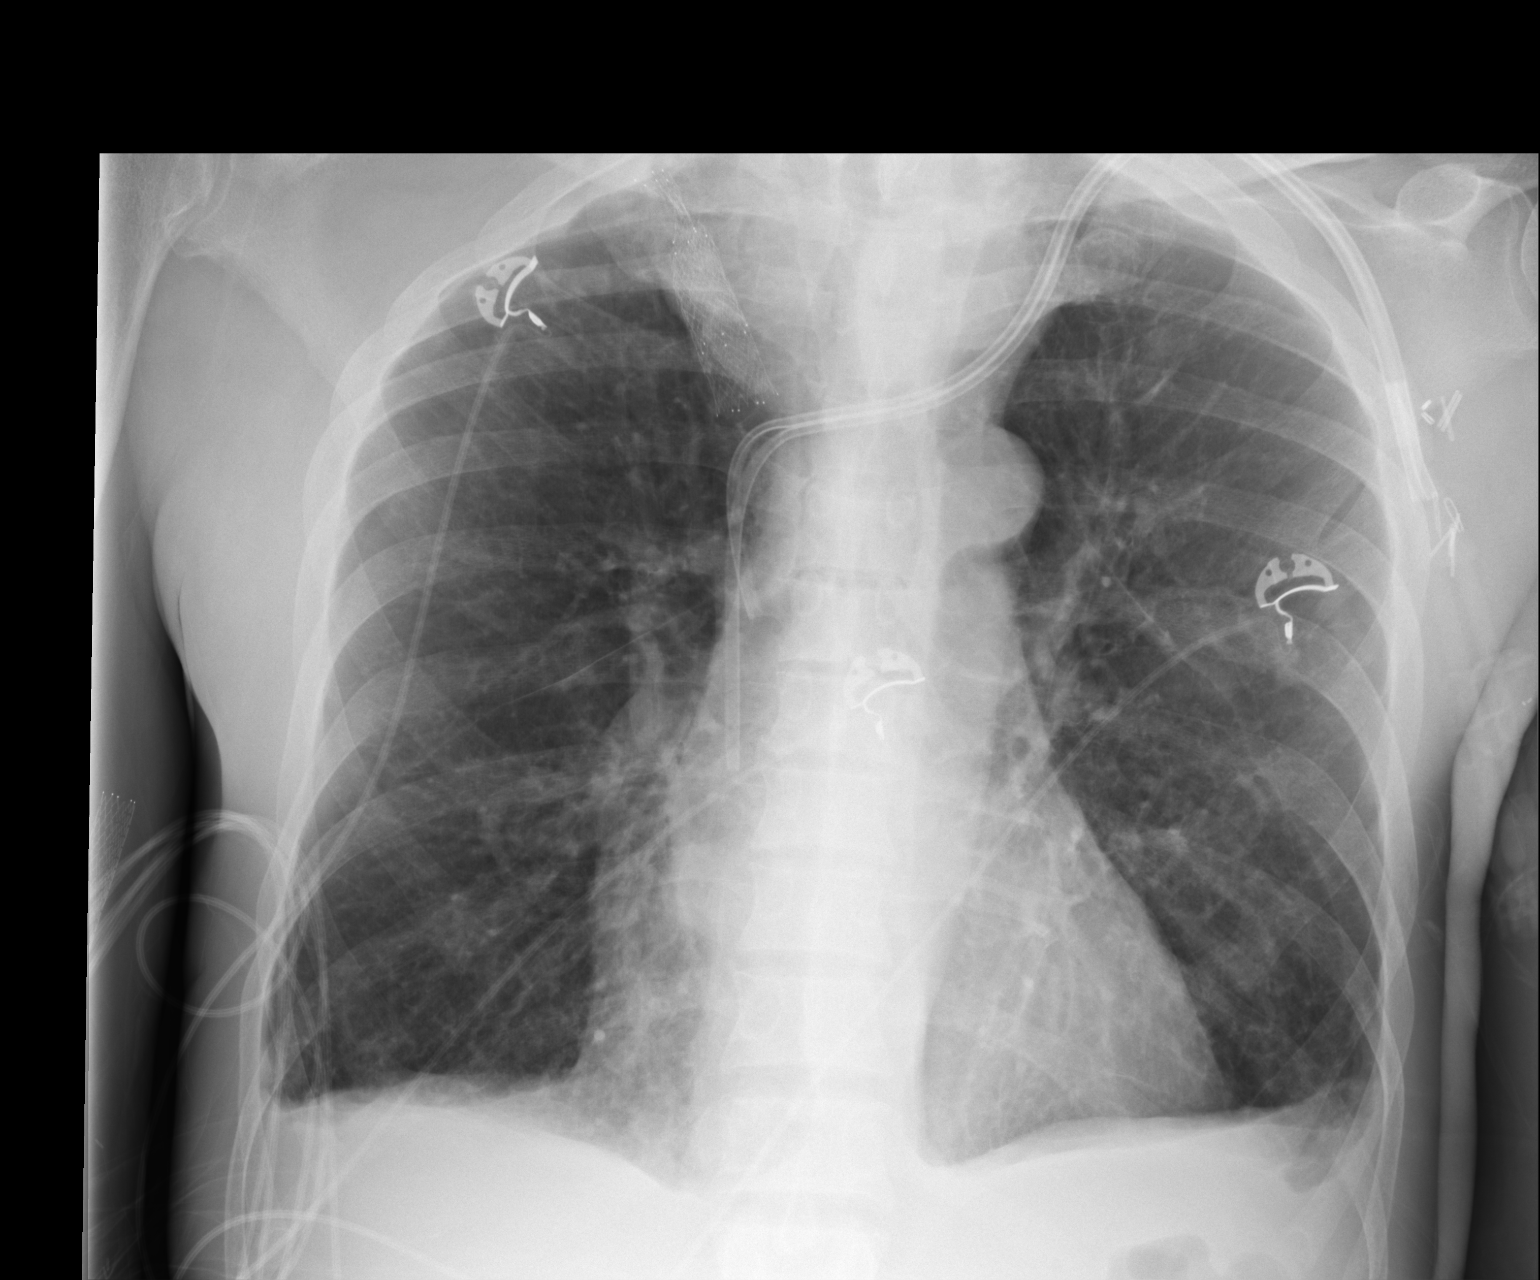

[1 of 1 positions shown; findings below may reference images not displayed]

FINDINGS: Left central line and right vascular stent stable. Stable mild
cardiac enlargement. Vascular pattern normal. No consolidation or
infiltrate. Blunting bilateral costophrenic angle suggesting very
small effusions, stable.
IMPRESSION: No change from prior study.  No acute findings.

## 2016-11-23 ENCOUNTER — Encounter: Payer: Medicare Other | Attending: Physician Assistant | Admitting: Physician Assistant

## 2016-11-23 ENCOUNTER — Encounter: Payer: Self-pay | Admitting: Emergency Medicine

## 2016-11-23 ENCOUNTER — Inpatient Hospital Stay
Admission: EM | Admit: 2016-11-23 | Discharge: 2016-11-30 | DRG: 617 | Disposition: A | Discharge status: Skilled Nursing Facility | Payer: Medicare Other | Attending: Specialist | Admitting: Specialist

## 2016-11-23 ENCOUNTER — Other Ambulatory Visit: Payer: Self-pay

## 2016-11-23 ENCOUNTER — Emergency Department: Payer: Medicare Other

## 2016-11-23 DIAGNOSIS — Z8249 Family history of ischemic heart disease and other diseases of the circulatory system: Secondary | ICD-10-CM

## 2016-11-23 DIAGNOSIS — L089 Local infection of the skin and subcutaneous tissue, unspecified: Secondary | ICD-10-CM | POA: Diagnosis present

## 2016-11-23 DIAGNOSIS — D631 Anemia in chronic kidney disease: Secondary | ICD-10-CM | POA: Diagnosis present

## 2016-11-23 DIAGNOSIS — Z87891 Personal history of nicotine dependence: Secondary | ICD-10-CM | POA: Insufficient documentation

## 2016-11-23 DIAGNOSIS — E1040 Type 1 diabetes mellitus with diabetic neuropathy, unspecified: Secondary | ICD-10-CM | POA: Diagnosis present

## 2016-11-23 DIAGNOSIS — Z9981 Dependence on supplemental oxygen: Secondary | ICD-10-CM

## 2016-11-23 DIAGNOSIS — Z992 Dependence on renal dialysis: Secondary | ICD-10-CM | POA: Insufficient documentation

## 2016-11-23 DIAGNOSIS — I96 Gangrene, not elsewhere classified: Secondary | ICD-10-CM | POA: Diagnosis present

## 2016-11-23 DIAGNOSIS — Z8349 Family history of other endocrine, nutritional and metabolic diseases: Secondary | ICD-10-CM

## 2016-11-23 DIAGNOSIS — Z8673 Personal history of transient ischemic attack (TIA), and cerebral infarction without residual deficits: Secondary | ICD-10-CM

## 2016-11-23 DIAGNOSIS — E785 Hyperlipidemia, unspecified: Secondary | ICD-10-CM | POA: Diagnosis present

## 2016-11-23 DIAGNOSIS — I70262 Atherosclerosis of native arteries of extremities with gangrene, left leg: Secondary | ICD-10-CM | POA: Diagnosis not present

## 2016-11-23 DIAGNOSIS — Z7982 Long term (current) use of aspirin: Secondary | ICD-10-CM

## 2016-11-23 DIAGNOSIS — E10621 Type 1 diabetes mellitus with foot ulcer: Secondary | ICD-10-CM | POA: Diagnosis present

## 2016-11-23 DIAGNOSIS — L97529 Non-pressure chronic ulcer of other part of left foot with unspecified severity: Secondary | ICD-10-CM | POA: Diagnosis present

## 2016-11-23 DIAGNOSIS — E1052 Type 1 diabetes mellitus with diabetic peripheral angiopathy with gangrene: Secondary | ICD-10-CM | POA: Diagnosis present

## 2016-11-23 DIAGNOSIS — J449 Chronic obstructive pulmonary disease, unspecified: Secondary | ICD-10-CM | POA: Insufficient documentation

## 2016-11-23 DIAGNOSIS — L0390 Cellulitis, unspecified: Secondary | ICD-10-CM | POA: Diagnosis present

## 2016-11-23 DIAGNOSIS — E1069 Type 1 diabetes mellitus with other specified complication: Principal | ICD-10-CM | POA: Diagnosis present

## 2016-11-23 DIAGNOSIS — Z794 Long term (current) use of insulin: Secondary | ICD-10-CM | POA: Insufficient documentation

## 2016-11-23 DIAGNOSIS — F17210 Nicotine dependence, cigarettes, uncomplicated: Secondary | ICD-10-CM | POA: Diagnosis present

## 2016-11-23 DIAGNOSIS — G8929 Other chronic pain: Secondary | ICD-10-CM | POA: Diagnosis present

## 2016-11-23 DIAGNOSIS — E876 Hypokalemia: Secondary | ICD-10-CM | POA: Diagnosis present

## 2016-11-23 DIAGNOSIS — N186 End stage renal disease: Secondary | ICD-10-CM | POA: Diagnosis present

## 2016-11-23 DIAGNOSIS — G4700 Insomnia, unspecified: Secondary | ICD-10-CM | POA: Diagnosis present

## 2016-11-23 DIAGNOSIS — L97528 Non-pressure chronic ulcer of other part of left foot with other specified severity: Secondary | ICD-10-CM | POA: Insufficient documentation

## 2016-11-23 DIAGNOSIS — E1022 Type 1 diabetes mellitus with diabetic chronic kidney disease: Secondary | ICD-10-CM | POA: Diagnosis present

## 2016-11-23 DIAGNOSIS — L03116 Cellulitis of left lower limb: Secondary | ICD-10-CM | POA: Insufficient documentation

## 2016-11-23 DIAGNOSIS — E039 Hypothyroidism, unspecified: Secondary | ICD-10-CM | POA: Diagnosis present

## 2016-11-23 DIAGNOSIS — Z880 Allergy status to penicillin: Secondary | ICD-10-CM

## 2016-11-23 DIAGNOSIS — Z833 Family history of diabetes mellitus: Secondary | ICD-10-CM

## 2016-11-23 DIAGNOSIS — F329 Major depressive disorder, single episode, unspecified: Secondary | ICD-10-CM | POA: Diagnosis present

## 2016-11-23 DIAGNOSIS — L8990 Pressure ulcer of unspecified site, unspecified stage: Secondary | ICD-10-CM | POA: Insufficient documentation

## 2016-11-23 DIAGNOSIS — Z79899 Other long term (current) drug therapy: Secondary | ICD-10-CM

## 2016-11-23 DIAGNOSIS — N2581 Secondary hyperparathyroidism of renal origin: Secondary | ICD-10-CM | POA: Diagnosis present

## 2016-11-23 DIAGNOSIS — Z8719 Personal history of other diseases of the digestive system: Secondary | ICD-10-CM

## 2016-11-23 DIAGNOSIS — I1311 Hypertensive heart and chronic kidney disease without heart failure, with stage 5 chronic kidney disease, or end stage renal disease: Secondary | ICD-10-CM | POA: Diagnosis present

## 2016-11-23 DIAGNOSIS — M869 Osteomyelitis, unspecified: Secondary | ICD-10-CM | POA: Diagnosis present

## 2016-11-23 DIAGNOSIS — K219 Gastro-esophageal reflux disease without esophagitis: Secondary | ICD-10-CM | POA: Diagnosis present

## 2016-11-23 DIAGNOSIS — Z86718 Personal history of other venous thrombosis and embolism: Secondary | ICD-10-CM

## 2016-11-23 LAB — COMPREHENSIVE METABOLIC PANEL
ALK PHOS: 138 U/L — AB (ref 38–126)
ALT: 41 U/L (ref 17–63)
ANION GAP: 9 (ref 5–15)
AST: 172 U/L — ABNORMAL HIGH (ref 15–41)
Albumin: 2.8 g/dL — ABNORMAL LOW (ref 3.5–5.0)
BUN: 13 mg/dL (ref 6–20)
CALCIUM: 8 mg/dL — AB (ref 8.9–10.3)
CHLORIDE: 98 mmol/L — AB (ref 101–111)
CO2: 29 mmol/L (ref 22–32)
Creatinine, Ser: 3.09 mg/dL — ABNORMAL HIGH (ref 0.61–1.24)
GFR calc non Af Amer: 20 mL/min — ABNORMAL LOW (ref >60)
GFR, EST AFRICAN AMERICAN: 23 mL/min — AB (ref >60)
Glucose, Bld: 214 mg/dL — ABNORMAL HIGH (ref 65–99)
Potassium: 3.9 mmol/L (ref 3.5–5.1)
Sodium: 136 mmol/L (ref 135–145)
Total Bilirubin: 0.6 mg/dL (ref 0.3–1.2)
Total Protein: 7.4 g/dL (ref 6.5–8.1)

## 2016-11-23 LAB — CBC WITH DIFFERENTIAL/PLATELET
Basophils Absolute: 0.1 10*3/uL (ref 0–0.1)
Basophils Relative: 2 %
Eosinophils Absolute: 0.2 10*3/uL (ref 0–0.7)
Eosinophils Relative: 5 %
HCT: 36.4 % — ABNORMAL LOW (ref 40.0–52.0)
Hemoglobin: 11.8 g/dL — ABNORMAL LOW (ref 13.0–18.0)
Lymphocytes Relative: 18 %
Lymphs Abs: 0.6 10*3/uL — ABNORMAL LOW (ref 1.0–3.6)
MCH: 29.6 pg (ref 26.0–34.0)
MCHC: 32.4 g/dL (ref 32.0–36.0)
MCV: 91.4 fL (ref 80.0–100.0)
MONOS PCT: 11 %
Monocytes Absolute: 0.4 10*3/uL (ref 0.2–1.0)
NEUTROS PCT: 64 %
Neutro Abs: 2.3 10*3/uL (ref 1.4–6.5)
PLATELETS: 185 10*3/uL (ref 150–440)
RBC: 3.98 MIL/uL — ABNORMAL LOW (ref 4.40–5.90)
RDW: 16.8 % — ABNORMAL HIGH (ref 11.5–14.5)
WBC: 3.5 10*3/uL — ABNORMAL LOW (ref 3.8–10.6)

## 2016-11-23 LAB — GLUCOSE, CAPILLARY
GLUCOSE-CAPILLARY: 225 mg/dL — AB (ref 65–99)
Glucose-Capillary: 258 mg/dL — ABNORMAL HIGH (ref 65–99)

## 2016-11-23 LAB — MRSA PCR SCREENING: MRSA by PCR: POSITIVE — AB

## 2016-11-23 MED ORDER — VANCOMYCIN HCL 500 MG IV SOLR
500.0000 mg | Freq: Once | INTRAVENOUS | Status: AC
  Administered 2016-11-23: 500 mg via INTRAVENOUS
  Filled 2016-11-23: qty 500

## 2016-11-23 MED ORDER — ACETAMINOPHEN 650 MG RE SUPP: 650.0000 mg | Freq: Four times a day (QID) | RECTAL | Status: DC | PRN

## 2016-11-23 MED ORDER — POLYETHYLENE GLYCOL 3350 17 G PO PACK
17.0000 g | PACK | Freq: Every day | ORAL | Status: DC
  Administered 2016-11-23 – 2016-11-30 (×5): 17 g via ORAL
  Filled 2016-11-23 (×5): qty 1

## 2016-11-23 MED ORDER — ALBUTEROL SULFATE (2.5 MG/3ML) 0.083% IN NEBU: 2.5000 mg | INHALATION_SOLUTION | RESPIRATORY_TRACT | Status: DC | PRN

## 2016-11-23 MED ORDER — LORATADINE 10 MG PO TABS
10.0000 mg | ORAL_TABLET | Freq: Every day | ORAL | Status: DC
  Administered 2016-11-25 – 2016-11-30 (×6): 10 mg via ORAL
  Filled 2016-11-23 (×6): qty 1

## 2016-11-23 MED ORDER — TRAZODONE HCL 100 MG PO TABS
100.0000 mg | ORAL_TABLET | Freq: Every day | ORAL | Status: DC
  Administered 2016-11-23 – 2016-11-29 (×7): 100 mg via ORAL
  Filled 2016-11-23 (×7): qty 1

## 2016-11-23 MED ORDER — B COMPLEX-C PO TABS
1.0000 | ORAL_TABLET | Freq: Every day | ORAL | Status: DC
  Administered 2016-11-25 – 2016-11-30 (×6): 1 via ORAL
  Filled 2016-11-23 (×7): qty 1

## 2016-11-23 MED ORDER — ALBUTEROL SULFATE HFA 108 (90 BASE) MCG/ACT IN AERS: 2.0000 | INHALATION_SPRAY | RESPIRATORY_TRACT | Status: DC | PRN

## 2016-11-23 MED ORDER — INSULIN GLARGINE 100 UNIT/ML ~~LOC~~ SOLN: 5.0000 [IU] | Freq: Every day | SUBCUTANEOUS | Status: DC

## 2016-11-23 MED ORDER — NUTRITIONAL SHAKE PO LIQD: 1.0000 | Freq: Three times a day (TID) | ORAL | Status: DC

## 2016-11-23 MED ORDER — CARVEDILOL 25 MG PO TABS
25.0000 mg | ORAL_TABLET | Freq: Two times a day (BID) | ORAL | Status: DC
  Administered 2016-11-23 – 2016-11-26 (×6): 25 mg via ORAL
  Filled 2016-11-23 (×6): qty 1

## 2016-11-23 MED ORDER — DOCUSATE SODIUM 100 MG PO CAPS
100.0000 mg | ORAL_CAPSULE | Freq: Two times a day (BID) | ORAL | Status: DC
  Administered 2016-11-23 – 2016-11-30 (×11): 100 mg via ORAL
  Filled 2016-11-23 (×12): qty 1

## 2016-11-23 MED ORDER — PROMETHAZINE HCL 25 MG RE SUPP: 25.0000 mg | Freq: Three times a day (TID) | RECTAL | Status: DC | PRN

## 2016-11-23 MED ORDER — ONDANSETRON HCL 4 MG PO TABS: 4.0000 mg | ORAL_TABLET | Freq: Four times a day (QID) | ORAL | Status: DC | PRN

## 2016-11-23 MED ORDER — MELATONIN 5 MG PO TABS
5.0000 mg | ORAL_TABLET | Freq: Every day | ORAL | Status: DC
  Administered 2016-11-23 – 2016-11-29 (×7): 5 mg via ORAL
  Filled 2016-11-23 (×8): qty 1

## 2016-11-23 MED ORDER — INSULIN ASPART 100 UNIT/ML ~~LOC~~ SOLN
0.0000 [IU] | Freq: Three times a day (TID) | SUBCUTANEOUS | Status: DC
  Administered 2016-11-23: 3 [IU] via SUBCUTANEOUS
  Filled 2016-11-23: qty 1

## 2016-11-23 MED ORDER — LEVOTHYROXINE SODIUM 50 MCG PO TABS: 125.0000 ug | ORAL_TABLET | Freq: Every day | ORAL | Status: DC

## 2016-11-23 MED ORDER — ACETAMINOPHEN 325 MG PO TABS: 650.0000 mg | ORAL_TABLET | Freq: Four times a day (QID) | ORAL | Status: DC | PRN

## 2016-11-23 MED ORDER — CALCIUM ACETATE (PHOS BINDER) 667 MG PO CAPS
1334.0000 mg | ORAL_CAPSULE | Freq: Two times a day (BID) | ORAL | Status: DC
  Administered 2016-11-23 – 2016-11-24 (×2): 1334 mg via ORAL
  Filled 2016-11-23 (×2): qty 2

## 2016-11-23 MED ORDER — FLUOXETINE HCL 20 MG PO CAPS
20.0000 mg | ORAL_CAPSULE | Freq: Every day | ORAL | Status: DC
  Administered 2016-11-25 – 2016-11-30 (×6): 20 mg via ORAL
  Filled 2016-11-23 (×7): qty 1

## 2016-11-23 MED ORDER — PRAVASTATIN SODIUM 20 MG PO TABS
10.0000 mg | ORAL_TABLET | Freq: Every day | ORAL | Status: DC
  Administered 2016-11-23 – 2016-11-29 (×7): 10 mg via ORAL
  Filled 2016-11-23 (×8): qty 1

## 2016-11-23 MED ORDER — MIRTAZAPINE 15 MG PO TABS
15.0000 mg | ORAL_TABLET | Freq: Every day | ORAL | Status: DC
  Administered 2016-11-23 – 2016-11-29 (×7): 15 mg via ORAL
  Filled 2016-11-23 (×7): qty 1

## 2016-11-23 MED ORDER — ASPIRIN 81 MG PO CHEW
81.0000 mg | CHEWABLE_TABLET | Freq: Every day | ORAL | Status: DC
  Administered 2016-11-25 – 2016-11-30 (×6): 81 mg via ORAL
  Filled 2016-11-23 (×6): qty 1

## 2016-11-23 MED ORDER — INSULIN ASPART 100 UNIT/ML ~~LOC~~ SOLN
0.0000 [IU] | Freq: Every day | SUBCUTANEOUS | Status: DC
  Administered 2016-11-23: 3 [IU] via SUBCUTANEOUS
  Filled 2016-11-23: qty 1

## 2016-11-23 MED ORDER — HYDROCODONE-ACETAMINOPHEN 5-325 MG PO TABS
1.0000 | ORAL_TABLET | Freq: Four times a day (QID) | ORAL | Status: DC | PRN
  Administered 2016-11-23 – 2016-11-29 (×4): 1 via ORAL
  Filled 2016-11-23 (×4): qty 1

## 2016-11-23 MED ORDER — INSULIN GLARGINE 100 UNIT/ML ~~LOC~~ SOLN
4.0000 [IU] | Freq: Every day | SUBCUTANEOUS | Status: DC
  Administered 2016-11-23: 4 [IU] via SUBCUTANEOUS
  Filled 2016-11-23 (×2): qty 0.04

## 2016-11-23 MED ORDER — LEVOTHYROXINE SODIUM 50 MCG PO TABS
275.0000 ug | ORAL_TABLET | Freq: Every day | ORAL | Status: DC
  Administered 2016-11-24 – 2016-11-30 (×5): 275 ug via ORAL
  Filled 2016-11-23 (×6): qty 2

## 2016-11-23 MED ORDER — VANCOMYCIN HCL IN DEXTROSE 1-5 GM/200ML-% IV SOLN
1000.0000 mg | Freq: Once | INTRAVENOUS | Status: AC
  Administered 2016-11-23: 1000 mg via INTRAVENOUS
  Filled 2016-11-23: qty 200

## 2016-11-23 MED ORDER — VANCOMYCIN HCL 500 MG IV SOLR
500.0000 mg | INTRAVENOUS | Status: DC
  Administered 2016-11-24 – 2016-11-27 (×2): 500 mg via INTRAVENOUS
  Filled 2016-11-23 (×4): qty 500

## 2016-11-23 MED ORDER — NEPRO/CARBSTEADY PO LIQD
237.0000 mL | Freq: Three times a day (TID) | ORAL | Status: DC
  Administered 2016-11-23 – 2016-11-30 (×14): 237 mL via ORAL

## 2016-11-23 MED ORDER — LEVOTHYROXINE SODIUM 50 MCG PO TABS: 150.0000 ug | ORAL_TABLET | Freq: Every day | ORAL | Status: DC

## 2016-11-23 MED ORDER — DEXTROSE 5 % IV SOLN
1.0000 g | Freq: Once | INTRAVENOUS | Status: AC
  Administered 2016-11-23: 1 g via INTRAVENOUS
  Filled 2016-11-23: qty 1

## 2016-11-23 MED ORDER — DEXTROSE 5 % IV SOLN
1.0000 g | INTRAVENOUS | Status: DC
  Administered 2016-11-25 – 2016-11-26 (×2): 1 g via INTRAVENOUS
  Filled 2016-11-23 (×6): qty 1

## 2016-11-23 MED ORDER — AMLODIPINE BESYLATE 10 MG PO TABS
10.0000 mg | ORAL_TABLET | Freq: Every day | ORAL | Status: DC
  Administered 2016-11-23 – 2016-11-26 (×3): 10 mg via ORAL
  Filled 2016-11-23 (×2): qty 1
  Filled 2016-11-23: qty 2
  Filled 2016-11-23: qty 1

## 2016-11-23 MED ORDER — SACCHAROMYCES BOULARDII 250 MG PO CAPS
250.0000 mg | ORAL_CAPSULE | Freq: Every day | ORAL | Status: DC
  Administered 2016-11-23 – 2016-11-30 (×7): 250 mg via ORAL
  Filled 2016-11-23 (×8): qty 1

## 2016-11-23 NOTE — Progress Notes (Signed)
Inpatient Diabetes Program Recommendations  AACE/ADA: New Consensus Statement on Inpatient Glycemic Control (2015)  Target Ranges:  Prepandial:   less than 140 mg/dL      Peak postprandial:   less than 180 mg/dL (1-2 hours)      Critically ill patients:  140 - 180 mg/dL   Lab Results  Component Value Date   GLUCAP 210 (H) 03/28/2014   HGBA1C 8.6 (H) 03/27/2014   Fructosamine 657 on 10/23/16 (normal 200-285 mcmol/L  Review of Glycemic Control- lab glucose  Results for [name], Marcus O (MRN [id number]) as of 11/23/2016 12:58  Ref. Range 11/23/2016 10:44  Glucose Latest Ref Range: 65 - 99 mg/dL 409214 (H)    [facility]: [facility]  Outpatient Diabetes medications: Reports Lantus 5 units qhs, Novolog 1-5 units tid with meals Current orders for Inpatient glycemic control: none  Inpatient Diabetes Program Recommendations:  If the patient remains in the ED for an extended period or if he is fed, please consider ordering Lantus 4 units qhs, Novolog 2 units tid for meals. Please order CBG checks tid and hs.  Patient has Type 1 diabetes and makes NO insulin.   Susette RacerJulie Kyrin Gratz, RN, BA, MHA, CDE Diabetes Coordinator Inpatient Diabetes Program  [phone] (Team Pager) [phone] [facility] Office) 11/23/2016 1:05 PM

## 2016-11-23 NOTE — Consult Note (Signed)
Reason for Consult: Osteomyelitis left foot Referring Physician: [place]  Marcus [name] is an 61 y.o. male.  HPI: This is a 61-year-old male with a history of a chronic sore on the outside of his left foot for at least the past couple of months. States he has been in a nursing home where this is been cared for. Has been wearing some pressure relief boots. Does not relate any history of injury. Denies any pain with this. Evaluated earlier today in the emergency department where x-ray showed osteomyelitis and he was admitted for definitive debridement.  Past Medical History:  Diagnosis Date  . Cataract   . CHF (congestive heart failure) (HCC)   . Chronic pain   . Cocaine abuse    past history  . COPD (chronic obstructive pulmonary disease) (HCC)   . CVA (cerebrovascular accident) (HCC)    hx CVA x 2  . Depression   . Diabetes mellitus (HCC)    Sees Dr. Ajay Kumar [phone] -- fasting blood 70-110s  . DKA (diabetic ketoacidosis) (HCC)    severe, Dec 2011  . DVT (deep venous thrombosis) (HCC)   . ESRD (end stage renal disease) on dialysis (HCC)    hd - [facility] [place]  . GERD (gastroesophageal reflux disease)   . GI bleed    2006, mallory-weiss tear  . Goiter    by CT Apr 2013  . Hemodialysis patient (HCC)    M, W, F  . History of blood transfusion   . HTN (hypertension)    sees Dr. Haque, [place]  . Hyperlipidemia   . Hypothyroidism   . Insomnia   . Neuromuscular disorder (HCC)    diabetic neuropathy  . Occlusion of carotid stent (HCC)    Per patients nursing aid  . On home oxygen therapy    2l nasal cannula at night  . Pneumonia    hx of  . Pulmonary edema    nov 2011, dry wt decreased  . Tobacco abuse    history of    Past Surgical History:  Procedure Laterality Date  . ARTERIOVENOUS GRAFT PLACEMENT Left    thigh  . AV FISTULA PLACEMENT  03/25/2008   right forearm  . AV FISTULA PLACEMENT  04/23/2012   Procedure: INSERTION OF ARTERIOVENOUS (AV) GORE-TEX  GRAFT THIGH;  Surgeon: Bardia E Fields, MD;  Location: [facility] OR;  Service: Vascular;  Laterality: Left;  . AV FISTULA PLACEMENT Left 03/14/2013   Procedure: INSERTION OF  ARTERIOVENOUS (AV) GORE-TEX GRAFT  LEFT THIGH;  Surgeon: Nira Visscher F Early, MD;  Location: [facility] OR;  Service: Vascular;  Laterality: Left;  . AV FISTULA PLACEMENT Right 12/30/2013   Procedure: INSERTION OF ARTERIOVENOUS (AV) GORE-TEX GRAFT THIGH;  Surgeon: Brian L Chen, MD;  Location: [facility] OR;  Service: Vascular;  Laterality: Right;  . AVGG REMOVAL Left 04/16/2013   Procedure: REMOVAL OF ARTERIOVENOUS GORETEX GRAFT Left Thigh;  Surgeon: Vance W Brabham, MD;  Location: [facility] OR;  Service: Vascular;  Laterality: Left;  . CAROTID STENT INSERTION Right   . COLONOSCOPY  09/16/13  . dyalisis catheter    . EYE SURGERY Bilateral    cataracts  . INSERTION OF DIALYSIS CATHETER Right 03/03/2013   Procedure: INSERTION OF DIALYSIS CATHETER;  Surgeon: Brian L Chen, MD;  Location: [facility];  Service: Vascular;  Laterality: Right;  . INSERTION OF DIALYSIS CATHETER Left 09/18/2013   Procedure: INSERTION OF DIALYSIS CATHETER-LEFT NECK VS LEFT THIGH;  Surgeon: James D Lawson, MD;  Location: [facility];    Service: Vascular;  Laterality: Left;  . LIGATION ARTERIOVENOUS GORTEX GRAFT Right 02/27/2013   Procedure: LIGATION ARTERIOVENOUS GORTEX GRAFT;  Surgeon: Christopher S Dickson, MD;  Location: [facility];  Service: Vascular;  Laterality: Right;  . LIGATION ARTERIOVENOUS GORTEX GRAFT Left 03/03/2013   Procedure: LIGATION Left ARTERIOVENOUS GORTEX Thigh GRAFT;  Surgeon: Brian L Chen, MD;  Location: [facility];  Service: Vascular;  Laterality: Left;  . LYMPH NODE BIOPSY Right 01/28/2013   Procedure: LYMPH NODE BIOPSY;  Surgeon: Misha Vanoverbeke M Gerkin, MD;  Location: [facility];  Service: General;  Laterality: Right;  . MASS BIOPSY N/A 01/28/2013   Procedure: NECK EXPLORATION;  Surgeon: Aarti Mankowski M Gerkin, MD;  Location: [facility];  Service: General;  Laterality: N/A;  . PATCH ANGIOPLASTY Left 04/16/2013    Procedure: Patch Angioplasty of left femoral artery;  Surgeon: Vance W Brabham, MD;  Location: [facility];  Service: Vascular;  Laterality: Left;  . REMOVAL OF A DIALYSIS CATHETER Right 09/18/2013   Procedure: REMOVAL OF A DIALYSIS CATHETER-RIGHT FEMORAL VEIN;  Surgeon: James D Lawson, MD;  Location: [facility];  Service: Vascular;  Laterality: Right;  . SHUNTOGRAM Right 03/28/2012   Procedure: SHUNTOGRAM;  Surgeon: Brian L Chen, MD;  [facility]: [facility];  Service: Cardiovascular;  Laterality: Right;  . THYROID SURGERY  01/28/2013   Dr Gerkin    Family History  Problem Relation Age of Onset  . Diabetes Sister   . Diabetes Brother   . Hypertension Brother   . Heart disease Brother   . Hypertension Mother   . Heart attack Mother   . Hyperlipidemia Mother   . Heart disease Mother        before age 60  . Hypertension Father     Social History:  reports that he has been smoking Cigarettes.  He has been smoking about 0.25 packs per day. He has never used smokeless tobacco. He reports that he does not drink alcohol or use drugs.  Allergies:  Allergies  Allergen Reactions  . Penicillins Other (See Comments)    Unknown reaction    Medications:  Scheduled: . amLODipine  10 mg Oral QHS  . [START ON 11/25/2016] aspirin  81 mg Oral Daily  . [START ON 11/24/2016] B-complex with vitamin C  1 tablet Oral Daily  . calcium acetate  1,334 mg Oral BID WC  . carvedilol  25 mg Oral BID WC  . docusate sodium  100 mg Oral BID  . feeding supplement (NEPRO CARB STEADY)  237 mL Oral TID WC  . [START ON 11/24/2016] FLUoxetine  20 mg Oral Daily  . insulin aspart  0-5 Units Subcutaneous QHS  . insulin aspart  0-9 Units Subcutaneous TID WC  . insulin glargine  4 Units Subcutaneous QHS  . [START ON 11/24/2016] levothyroxine  275 mcg Oral QAC breakfast  . [START ON 11/24/2016] loratadine  10 mg Oral Daily  . Melatonin  5 mg Oral QHS  . mirtazapine  15 mg Oral QHS  . polyethylene glycol  17 g Oral Daily  .  pravastatin  10 mg Oral QHS  . saccharomyces boulardii  250 mg Oral Daily  . traZODone  100 mg Oral QHS    Results for orders placed or performed during the hospital encounter of 11/23/16 (from the past 48 hour(s))  Comprehensive metabolic panel     Status: Abnormal   Collection Time: 11/23/16 10:44 AM  Result Value Ref Range   Sodium 136 135 - 145 mmol/L   Potassium 3.9   3.5 - 5.1 mmol/L   Chloride 98 (L) 101 - 111 mmol/L   CO2 29 22 - 32 mmol/L   Glucose, Bld 214 (H) 65 - 99 mg/dL   BUN 13 6 - 20 mg/dL   Creatinine, Ser 3.09 (H) 0.61 - 1.24 mg/dL   Calcium 8.0 (L) 8.9 - 10.3 mg/dL   Total Protein 7.4 6.5 - 8.1 g/dL   Albumin 2.8 (L) 3.5 - 5.0 g/dL   AST 172 (H) 15 - 41 U/L   ALT 41 17 - 63 U/L   Alkaline Phosphatase 138 (H) 38 - 126 U/L   Total Bilirubin 0.6 0.3 - 1.2 mg/dL   GFR calc non Af Amer 20 (L) >60 mL/min   GFR calc Af Amer 23 (L) >60 mL/min    Comment: (NOTE) The eGFR has been calculated using the CKD EPI equation. This calculation has not been validated in all clinical situations. eGFR's persistently <60 mL/min signify possible Chronic Kidney Disease.    Anion gap 9 5 - 15  CBC with Differential     Status: Abnormal   Collection Time: 11/23/16 10:44 AM  Result Value Ref Range   WBC 3.5 (L) 3.8 - 10.6 K/uL   RBC 3.98 (L) 4.40 - 5.90 MIL/uL   Hemoglobin 11.8 (L) 13.0 - 18.0 g/dL   HCT 36.4 (L) 40.0 - 52.0 %   MCV 91.4 80.0 - 100.0 fL   MCH 29.6 26.0 - 34.0 pg   MCHC 32.4 32.0 - 36.0 g/dL   RDW 16.8 (H) 11.5 - 14.5 %   Platelets 185 150 - 440 K/uL   Neutrophils Relative % 64 %   Neutro Abs 2.3 1.4 - 6.5 K/uL   Lymphocytes Relative 18 %   Lymphs Abs 0.6 (L) 1.0 - 3.6 K/uL   Monocytes Relative 11 %   Monocytes Absolute 0.4 0.2 - 1.0 K/uL   Eosinophils Relative 5 %   Eosinophils Absolute 0.2 0 - 0.7 K/uL   Basophils Relative 2 %   Basophils Absolute 0.1 0 - 0.1 K/uL  Glucose, capillary     Status: Abnormal   Collection Time: 11/23/16  4:53 PM  Result  Value Ref Range   Glucose-Capillary 225 (H) 65 - 99 mg/dL    Dg Foot 2 Views Left  Result Date: 11/23/2016 CLINICAL DATA:  Fifth toe infection. EXAM: LEFT FOOT - 2 VIEW COMPARISON:  Left foot x-rays dated Sep 05, 2012. FINDINGS: There is extensive bony destruction of the fifth metatarsal and proximal phalanx with surrounding soft tissue gas. Permeative appearance of the proximal fifth metatarsal. No additional sites of cortical destruction. No acute fracture. Hyperflexion at the first IP joint. Osteopenia. IMPRESSION: 1. Extensive bony destruction of the fifth metatarsal and proximal phalanx, consistent with osteomyelitis. Electronically Signed   By: William T Derry M.D.   On: 11/23/2016 11:45    Review of Systems  Constitutional: Negative for chills and fever.  HENT: Negative.   Eyes: Negative.   Respiratory: Negative.   Cardiovascular: Negative.   Gastrointestinal: Negative for nausea and vomiting.  Genitourinary: Negative.   Musculoskeletal: Negative.   Skin:       Chronic draining wound on his left foot  Neurological:       Patient does relate some numbness in the feet related to his diabetes.  Endo/Heme/Allergies: Negative.   Psychiatric/Behavioral: Negative.    Blood pressure 114/60, pulse 66, temperature 97.6 F (36.4 C), temperature source Oral, resp. rate 17, height 5' 9" (1.753 m), weight 56.9 kg (  125 lb 6.4 oz), SpO2 100 %. Physical Exam  Cardiovascular:  DP pulse is 2 over 4 bilateral. PT pulse is trace bilateral.  Musculoskeletal:  Stiff range of motion in the pedal joints. Muscle atrophy in the lower extremities with some mild contracture. Muscle testing is deferred.  Neurological:  Complete loss of protective threshold with a monofilament wire from the lower leg distal to the toes. Proprioception is completely impaired.  Skin:  The skin is then dry and atrophic with absent hair growth. Full-thickness ulceration with necrosis down to the level of bone on the lateral  aspect of the left fifth metatarsal. Some significant erythema and edema laterally.    Assessment/Plan: Assessment: 1. Osteomyelitis left fifth metatarsal. 2. Diabetes with associated neuropathy.  Plan: Dressing reapplied to the left foot. Discussed with the patient the need for debridement of the infected bone with amputation of the left foot and a ray resection. Discussed possible risks and complications of the procedure including inability to heal due to his diabetes or peripheral vascular disease as well as continued infection. Discussed that he would be at significant risk for limb loss. Questions invited. Obtain consent for debridement of infected bone with fifth ray resection left foot. Nothing by mouth after early breakfast tomorrow. Plan for surgery tomorrow late afternoon or evening  Annikah Lovins W Dhruva Orndoff 11/23/2016, 6:06 PM     

## 2016-11-23 NOTE — ED Provider Notes (Signed)
[facility] Emergency Department Provider Note   ____________________________________________   I have reviewed the triage vital signs and the nursing notes.   HISTORY  Chief Complaint Wound Infection   History limited by: Poor historian    HPI Marcus [name] is a 61 y.o. male who presents to the emergency department today because of concerns for wound to his left fifth toe. The patient is unsure just how long it is been there but it sounds like it is been there at least 4 weeks. Denies any significant pain with going. He denies any associated fevers nausea or vomiting. Patient does have a history of diabetes. He went to wound care center today where they sent to the emergency department for further eval.   Past Medical History:  Diagnosis Date  . Cataract   . CHF (congestive heart failure) (HCC)   . Chronic pain   . Cocaine abuse    past history  . COPD (chronic obstructive pulmonary disease) (HCC)   . CVA (cerebrovascular accident) (HCC)    hx CVA x 2  . Depression   . Diabetes mellitus (HCC)    Sees Dr. Reather Littler [phone] -- fasting blood 70-110s  . DKA (diabetic ketoacidosis) (HCC)    severe, Dec 2011  . DVT (deep venous thrombosis) (HCC)   . ESRD (end stage renal disease) on dialysis (HCC)    hd - [facility] [place]  . GERD (gastroesophageal reflux disease)   . GI bleed    2006, mallory-weiss tear  . Goiter    by CT Apr 2013  . Hemodialysis patient (HCC)    M, W, F  . History of blood transfusion   . HTN (hypertension)    sees Dr. Ardelle Park, Rosalita Levan  . Hyperlipidemia   . Hypothyroidism   . Insomnia   . Neuromuscular disorder (HCC)    diabetic neuropathy  . Occlusion of carotid stent [facility])    Per patients nursing aid  . On home oxygen therapy    2l nasal cannula at night  . Pneumonia    hx of  . Pulmonary edema    nov 2011, dry wt decreased  . Tobacco abuse    history of    Patient Active Problem List   Diagnosis Date Noted   . DKA (diabetic ketoacidoses) (HCC) 03/25/2014  . Hyperkalemia 03/25/2014  . COPD (chronic obstructive pulmonary disease) (HCC) 03/25/2014  . Tobacco use disorder 03/25/2014  . 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 Acute encephalopathy 03/25/2014  . DKA, type 1 (HCC) 01/30/2014  . HHNC (hyperglycemic hyperosmolar nonketotic coma) (HCC) 07/13/2013  . 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 Protein-calorie malnutrition, severe (HCC) 02/27/2013  . Thyroid mass 02/25/2013  . Thyroid goiter 01/16/2013  . Unspecified essential hypertension 01/02/2013  . Hypothyroidism 12/31/2012  . ESRD (end stage renal disease) on dialysis (HCC) 09/22/2011  . 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 Hyponatremia 09/22/2011    Past Surgical History:  Procedure Laterality Date  . ARTERIOVENOUS GRAFT PLACEMENT Left    thigh  . AV FISTULA PLACEMENT  03/25/2008   right forearm  . AV FISTULA PLACEMENT  04/23/2012   Procedure: INSERTION OF ARTERIOVENOUS (AV) GORE-TEX GRAFT THIGH;  Surgeon: Sherren Kerns, MD;  Location: [facility] OR;  Service: Vascular;  Laterality: Left;  . AV FISTULA PLACEMENT Left 03/14/2013   Procedure: INSERTION OF  ARTERIOVENOUS (AV) GORE-TEX GRAFT  LEFT THIGH;  Surgeon: Larina Earthly, MD;  Location:  [facility] OR;  Service: Vascular;  Laterality: Left;  . AV FISTULA PLACEMENT Right 12/30/2013   Procedure: INSERTION OF ARTERIOVENOUS (AV) GORE-TEX GRAFT THIGH;  Surgeon: Fransisco Hertz, MD;  Location: [facility] OR;  Service: Vascular;  Laterality: Right;  . AVGG REMOVAL Left 04/16/2013   Procedure: REMOVAL OF ARTERIOVENOUS GORETEX GRAFT Left Thigh;  Surgeon: Nada Libman, MD;  Location: [facility] OR;  Service: Vascular;  Laterality: Left;  . CAROTID STENT INSERTION Right   . COLONOSCOPY  09/16/13  . dyalisis catheter    . EYE SURGERY Bilateral    cataracts  . INSERTION OF DIALYSIS CATHETER Right 03/03/2013   Procedure: INSERTION OF DIALYSIS CATHETER;  Surgeon: Fransisco Hertz, MD;  Location: [facility] OR;  Service: Vascular;  Laterality: Right;  . INSERTION OF DIALYSIS CATHETER Left 09/18/2013   Procedure: INSERTION OF DIALYSIS CATHETER-LEFT NECK VS LEFT THIGH;  Surgeon: Pryor Ochoa, MD;  Location: [facility] OR;  Service: Vascular;  Laterality: Left;  . LIGATION ARTERIOVENOUS GORTEX GRAFT Right 02/27/2013   Procedure: LIGATION ARTERIOVENOUS GORTEX GRAFT;  Surgeon: Chuck Hint, MD;  Location[facility] OR;  Service: Vascular;  Laterality: Right;  . LIGATION ARTERIOVENOUS GORTEX GRAFT Left 03/03/2013   Procedure: LIGATION Left ARTERIOVENOUS GORTEX Thigh GRAFT;  Surgeon: Fransisco Hertz, MD;  Location: [facility] OR;  Service: Vascular;  Laterality: Left;  . LYMPH NODE BIOPSY Right 01/28/2013   Procedure: LYMPH NODE BIOPSY;  Surgeon: Velora Heckler, MD;  Location: [facility] OR;  Service: General;  Laterality: Right;  . MASS BIOPSY N/A 01/28/2013   Procedure: NECK EXPLORATION;  Surgeon: Velora Heckler, MD;  Location: [facility] OR;  Service: General;  Laterality: N/A;  . PATCH ANGIOPLASTY Left 04/16/2013   Procedure: Patch Angioplasty of left femoral artery;  Surgeon: Nada Libman, MD;  Location: [facility] OR;  Service: Vascular;  Laterality: Left;  . REMOVAL OF A DIALYSIS CATHETER Right 09/18/2013   Procedure: REMOVAL OF A DIALYSIS CATHETER-RIGHT FEMORAL VEIN;  Surgeon: Pryor Ochoa, MD;  Location: [facility] OR;  Service: Vascular;  Laterality: Right;  . SHUNTOGRAM Right 03/28/2012   Procedure: SHUNTOGRAM;  Surgeon: Fransisco Hertz, MD;  Location: [facility] CATH LAB;  Service: Cardiovascular;  Laterality: Right;  . THYROID SURGERY  01/28/2013   Dr Gerrit Friends    Prior to Admission medications   Medication Sig Start Date End Date Taking? Authorizing Provider  albuterol (VENTOLIN HFA) 108 (90 BASE) MCG/ACT inhaler Inhale 2 puffs into the lungs every 4 (four) hours as needed for wheezing or shortness of breath.    [provider]  amLODipine (NORVASC) 10 MG tablet Take 1 tablet (10 mg total) by mouth at bedtime.  02/02/14   Jeralyn Bennett, MD  calcitRIOL (ROCALTROL) 0.25 MCG capsule Take 1 capsule (0.25 mcg total) by mouth every Monday, Wednesday, and Friday. 02/02/14   Jeralyn Bennett, MD  docusate sodium (COLACE) 100 MG capsule Take 1 capsule (100 mg total) by mouth 2 (two) times daily. 02/02/14   Jeralyn Bennett, MD  ferrous sulfate 325 (65 FE) MG tablet Take 325 mg by mouth 2 (two) times daily with a meal.    [provider]  FLUoxetine (PROZAC) 20 MG capsule Take 20 mg by mouth daily.    [provider]  insulin aspart (NOVOLOG) 100 UNIT/ML injection Inject 5 Units into the skin 3 (three) times daily with meals. 03/28/14   Clydia Llano, MD  insulin glargine (LANTUS) 100 UNIT/ML injection Inject 0.15 mLs (15 Units total) into the skin at bedtime.  03/28/14   Clydia LlanoElmahi, Mutaz, MD  levothyroxine (SYNTHROID, LEVOTHROID) 125 MCG tablet Take 125 mcg by mouth daily before breakfast.    [provider]  levothyroxine (SYNTHROID, LEVOTHROID) 150 MCG tablet Take 150 mcg by mouth daily before breakfast.    [provider]  metoprolol (LOPRESSOR) 50 MG tablet Take 1 tablet (50 mg total) by mouth 2 (two) times daily. 02/02/14   Jeralyn BennettZamora, Ezequiel, MD  multivitamin (RENA-VIT) TABS tablet Take 1 tablet by mouth daily.    [provider]  Nutritional Supplements (NUTRITIONAL SHAKE PO) Take 1 Bottle by mouth 3 (three) times daily with meals.    [provider]  ondansetron (ZOFRAN) 4 MG tablet Take 4 mg by mouth every 6 (six) hours as needed for nausea or vomiting.    [provider]  oxyCODONE (OXY IR/ROXICODONE) 5 MG immediate release tablet Take 5 mg by mouth every 6 (six) hours as needed for severe pain.    [provider]  pravastatin (PRAVACHOL) 10 MG tablet Take 1 tablet (10 mg total) by mouth at bedtime. 02/02/14   Jeralyn BennettZamora, Ezequiel, MD  promethazine (PHENADOZ) 25 MG suppository Place 25 mg rectally every 8 (eight) hours as needed for nausea or  vomiting.    [provider]  traZODone (DESYREL) 50 MG tablet Take 25 mg by mouth at bedtime.    [provider]  warfarin (COUMADIN) 2.5 MG tablet Take 2.5 mg by mouth daily.    [provider]    Allergies Penicillins  Family History  Problem Relation Age of Onset  . Diabetes Sister   . Diabetes Brother   . Hypertension Brother   . Heart disease Brother   . Hypertension Mother   . Heart attack Mother   . Hyperlipidemia Mother   . Heart disease Mother        before age 61  . Hypertension Father     Social History Social History  Substance Use Topics  . Smoking status: Current Every Day Smoker    Packs/day: 0.25    Types: Cigarettes  . Smokeless tobacco: Never Used     Comment: pt states that he has been thinking about giving it up  . Alcohol use No    Review of Systems Constitutional: No fever/chills Eyes: No visual changes. ENT: No sore throat. Cardiovascular: Denies chest pain. Respiratory: Denies shortness of breath. Gastrointestinal: No abdominal pain.  No nausea, no vomiting.  No diarrhea.   Genitourinary: Negative for dysuria. Musculoskeletal: Negative for back pain. Skin: Positive for wound to his left foot.  Neurological: Negative for headaches, focal weakness or numbness.  ____________________________________________   PHYSICAL EXAM:  VITAL SIGNS: ED Triage Vitals  Enc Vitals Group     BP 11/23/16 1029 (!) 100/57     Pulse Rate 11/23/16 1029 66     Resp 11/23/16 1029 18     Temp 11/23/16 1029 98 F (36.7 C)     Temp Source 11/23/16 1029 Oral     SpO2 11/23/16 1029 98 %     Weight 11/23/16 1030 131 lb (59.4 kg)     Height 11/23/16 1030 5\' 9"  (1.753 m)    Constitutional: Alert and oriented. Well appearing and in no distress. Eyes: Conjunctivae are normal.  ENT   Head: Normocephalic and atraumatic.   Nose: No congestion/rhinnorhea.   Mouth/Throat: Mucous membranes are moist.   Neck: No  stridor. Hematological/Lymphatic/Immunilogical: No cervical lymphadenopathy. Cardiovascular: Normal rate, regular rhythm.  No murmurs, rubs, or gallops.  Respiratory:  Normal respiratory effort without tachypnea nor retractions. Breath sounds are clear and equal bilaterally. No wheezes/rales/rhonchi. Gastrointestinal: Soft and non tender. No rebound. No guarding.  Genitourinary: Deferred Musculoskeletal: Positive for left lateral ulcer to base of left fifth toe. Foul smelling. Neurologic:  Normal speech and language. No gross focal neurologic deficits are appreciated.  Skin:  Wound to base of left fifth toe.  Psychiatric: Mood and affect are normal. Speech and behavior are normal. Patient exhibits appropriate insight and judgment.  ____________________________________________    LABS (pertinent positives/negatives)  Labs Reviewed  COMPREHENSIVE METABOLIC PANEL - Abnormal; Notable for the following:       Result Value   Chloride 98 (*)    Glucose, Bld 214 (*)    Creatinine, Ser 3.09 (*)    Calcium 8.0 (*)    Albumin 2.8 (*)    AST 172 (*)    Alkaline Phosphatase 138 (*)    GFR calc non Af Amer 20 (*)    GFR calc Af Amer 23 (*)    All other components within normal limits  CBC WITH DIFFERENTIAL/PLATELET - Abnormal; Notable for the following:    WBC 3.5 (*)    RBC 3.98 (*)    Hemoglobin 11.8 (*)    HCT 36.4 (*)    RDW 16.8 (*)    Lymphs Abs 0.6 (*)    All other components within normal limits     ____________________________________________   EKG  None  ____________________________________________    RADIOLOGY  Left foot IMPRESSION:  1. Extensive bony destruction of the fifth metatarsal and proximal  phalanx, consistent with osteomyelitis.    ____________________________________________   PROCEDURES  Procedures  ____________________________________________   INITIAL IMPRESSION / ASSESSMENT AND PLAN / ED COURSE  Pertinent labs & imaging results that  were available during my care of the patient were reviewed by me and considered in my medical decision making (see chart for details).  Patient presents to the emergency department today because of concerns for wound to the left fifth toe. X-ray does show extensive bony destruction. Discussed with Dr. Alberteen Spindle. Will start IV abx and admit.  ____________________________________________   FINAL CLINICAL IMPRESSION(S) / ED DIAGNOSES  Final diagnoses:  Osteomyelitis, unspecified site, unspecified type [facility])     Note: This dictation was prepared with Dragon dictation. Any transcriptional errors that result from this process are unintentional     Phineas Semen, MD 11/23/16 1239

## 2016-11-23 NOTE — Progress Notes (Addendum)
Pt was admitted to room 207. Pt.'s VSS. Skin was assessment was performed with Buddy DutyJosh Simser, RN. Pt has a stage II to his sacrum, sacrum foam was applied. Pt has a open sore to his left side of his foot, dressing was applied. Prevelon boots were applied. Will continue to monitor. Pt brought in a pack of cigarettes and a lighter, RN placed cigarettes and lighter in pt.'s bin in the med room. Pt also brought in 122 dollars, pt wanted to keep at the bedside. RN placed 122 dollars back in his coat pocket in the cabinet under the sink.   Sharika Mosquera [employer] [place]

## 2016-11-23 NOTE — Consult Note (Signed)
[facility] CONSULT NOTE    Date: 11/23/2016                  Patient Name:  Marcus [name]  MRN: [id number]  DOB: [birth year]  Age / Sex: 61 y.o., male         PCP: Lindwood Qua, MD                 Service Requesting Consult: Hospitalist                 Reason for Consult: Management of ESRD            History of Present Illness: Patient is a 61 y.o. male with a PMHx of ESRD on HD MWF, diabetes mellitus type 1, COPD, tobacco abuse, hypertension, anemia of chronic kidney disease, secondary hyperparathyroidism,history of CVA, prior episodes of DKA, who was admitted to [facility] on 11/23/2016 for evaluation of left fifth toe infection.  He's had significant redness and pain surrounding his left fifth toe.  He also has an open ulcer and drainage from this area.  X-ray of the foot was consistent with osteomyelitis.  Patient is a rather poor historian.  He is unclear as to whether he had dialysis yesterday.  His potassium was acceptable at 3.9.  The patient has a left femoral PermCath in place.   Medications: Outpatient medications: Prescriptions Prior to Admission  Medication Sig Dispense Refill Last Dose  . amLODipine (NORVASC) 10 MG tablet Take 1 tablet (10 mg total) by mouth at bedtime. 30 tablet 2 11/22/2016 at 2100  . aspirin 81 MG chewable tablet Chew 81 mg by mouth daily.   11/22/2016 at 0800  . b complex vitamins tablet Take 1 tablet by mouth daily.   11/22/2016 at 0800  . calcium acetate (PHOSLO) 667 MG capsule Take by mouth 2 (two) times daily.   11/22/2016 at 1800  . carvedilol (COREG) 12.5 MG tablet Take 25 mg by mouth 2 (two) times daily with a meal.   11/22/2016 at 1800  . cetirizine (ZYRTEC) 10 MG tablet Take 10 mg by mouth daily.   11/22/2016 at 0800  . FLUoxetine (PROZAC) 20 MG capsule Take 20 mg by mouth daily.   11/22/2016 at 0800  . HYDROcodone-acetaminophen (NORCO/VICODIN) 5-325 MG tablet Take 1 tablet by mouth every 6 (six) hours as needed for moderate pain.    prn at prn  . insulin glargine (LANTUS) 100 UNIT/ML injection Inject 0.15 mLs (15 Units total) into the skin at bedtime. (Patient taking differently: Inject 5 Units into the skin at bedtime. ) 10 mL 11 11/22/2016 at 2100  . levothyroxine (SYNTHROID, LEVOTHROID) 125 MCG tablet Take 125 mcg by mouth daily before breakfast.   11/22/2016 at 0800  . levothyroxine (SYNTHROID, LEVOTHROID) 150 MCG tablet Take 150 mcg by mouth daily before breakfast.   11/22/2016 at 0800  . Melatonin 3 MG TABS Take 3 mg by mouth at bedtime.   11/22/2016 at 2100  . mirtazapine (REMERON) 15 MG tablet Take 15 mg by mouth at bedtime.   11/22/2016 at 2100  . pravastatin (PRAVACHOL) 10 MG tablet Take 1 tablet (10 mg total) by mouth at bedtime. 30 tablet 1 11/22/2016 at 2100  . saccharomyces boulardii (FLORASTOR) 250 MG capsule Take 250 mg by mouth daily.   11/22/2016 at 0800  . traZODone (DESYREL) 100 MG tablet Take 100 mg by mouth at bedtime.    11/22/2016 at 0800  . albuterol (VENTOLIN HFA) 108 (90 BASE)  MCG/ACT inhaler Inhale 2 puffs into the lungs every 4 (four) hours as needed for wheezing or shortness of breath.   prn at prn  . docusate sodium (COLACE) 100 MG capsule Take 1 capsule (100 mg total) by mouth 2 (two) times daily. 10 capsule 0 prn at prn  . insulin aspart (NOVOLOG) 100 UNIT/ML injection Inject 5 Units into the skin 3 (three) times daily with meals. (Patient taking differently: Inject 1-5 Units into the skin 3 (three) times daily with meals. ) 10 mL 11 prn at prn  . Nutritional Supplements (NUTRITIONAL SHAKE PO) Take 1 Bottle by mouth 3 (three) times daily with meals.   Unknown  . ondansetron (ZOFRAN) 4 MG tablet Take 4 mg by mouth every 6 (six) hours as needed for nausea or vomiting.   prn at prn  . polyethylene glycol (MIRALAX / GLYCOLAX) packet Take 17 g by mouth daily.   prn at prn  . promethazine (PHENADOZ) 25 MG suppository Place 25 mg rectally every 8 (eight) hours as needed for nausea or vomiting.   prn at prn    Current  medications: Current Facility-Administered Medications  Medication Dose Route Frequency Provider Last Rate Last Dose  . acetaminophen (TYLENOL) tablet 650 mg  650 mg Oral Q6H PRN Alford HighlandWieting, Richard, MD       Or  . acetaminophen (TYLENOL) suppository 650 mg  650 mg Rectal Q6H PRN Wieting, Richard, MD      . albuterol (PROVENTIL) (2.5 MG/3ML) 0.083% nebulizer solution 2.5 mg  2.5 mg Nebulization Q4H PRN Alford HighlandWieting, Richard, MD      . amLODipine (NORVASC) tablet 10 mg  10 mg Oral QHS Alford HighlandWieting, Richard, MD      . Melene Muller[START ON 11/25/2016] aspirin chewable tablet 81 mg  81 mg Oral Daily Alford HighlandWieting, Richard, MD      . Melene Muller[START ON 11/24/2016] B-complex with vitamin C tablet 1 tablet  1 tablet Oral Daily Wieting, Richard, MD      . calcium acetate (PHOSLO) capsule 1,334 mg  1,334 mg Oral BID WC Wieting, Richard, MD      . carvedilol (COREG) tablet 25 mg  25 mg Oral BID WC Alford HighlandWieting, Richard, MD      . Melene Muller[START ON 11/24/2016] ceFEPIme (MAXIPIME) 1 g in dextrose 5 % 50 mL IVPB  1 g Intravenous Q24H Maccia, Melissa D, RPH      . ceFEPIme (MAXIPIME) 1 g in dextrose 5 % 50 mL IVPB  1 g Intravenous Once Olene FlossMaccia, Melissa D, RPH      . docusate sodium (COLACE) capsule 100 mg  100 mg Oral BID Wieting, Richard, MD      . feeding supplement (NEPRO CARB STEADY) liquid 237 mL  237 mL Oral TID WC Alford HighlandWieting, Richard, MD      . Melene Muller[START ON 11/24/2016] FLUoxetine (PROZAC) capsule 20 mg  20 mg Oral Daily Wieting, Richard, MD      . HYDROcodone-acetaminophen (NORCO/VICODIN) 5-325 MG per tablet 1 tablet  1 tablet Oral Q6H PRN Wieting, Richard, MD      . insulin aspart (novoLOG) injection 0-5 Units  0-5 Units Subcutaneous QHS Wieting, Richard, MD      . insulin aspart (novoLOG) injection 0-9 Units  0-9 Units Subcutaneous TID WC Wieting, Richard, MD      . insulin glargine (LANTUS) injection 4 Units  4 Units Subcutaneous QHS Wieting, Richard, MD      . Melene Muller[START ON 11/24/2016] levothyroxine (SYNTHROID, LEVOTHROID) tablet 275 mcg  275 mcg Oral QAC breakfast  Alford Highland, MD      . Melene Muller ON 11/24/2016] loratadine (CLARITIN) tablet 10 mg  10 mg Oral Daily Wieting, Richard, MD      . Melatonin TABS 5 mg  5 mg Oral QHS Wieting, Richard, MD      . mirtazapine (REMERON) tablet 15 mg  15 mg Oral QHS Wieting, Richard, MD      . ondansetron [facility]) tablet 4 mg  4 mg Oral Q6H PRN Wieting, Richard, MD      . polyethylene glycol (MIRALAX / GLYCOLAX) packet 17 g  17 g Oral Daily Wieting, Richard, MD      . pravastatin (PRAVACHOL) tablet 10 mg  10 mg Oral QHS Wieting, Richard, MD      . promethazine (PHENERGAN) suppository 25 mg  25 mg Rectal Q8H PRN Wieting, Richard, MD      . saccharomyces boulardii (FLORASTOR) capsule 250 mg  250 mg Oral Daily Wieting, Richard, MD      . traZODone (DESYREL) tablet 100 mg  100 mg Oral QHS Wieting, Richard, MD      . vancomycin (VANCOCIN) 500 mg in sodium chloride 0.9 % 100 mL IVPB  500 mg Intravenous Once Olene Floss, RPH      . [START ON 11/24/2016] vancomycin (VANCOCIN) 500 mg in sodium chloride 0.9 % 100 mL IVPB  500 mg Intravenous Q M,W,F-HD Maccia, Melissa D, RPH       Facility-Administered Medications Ordered in Other Encounters  Medication Dose Route Frequency Provider Last Rate Last Dose  . vancomycin (VANCOCIN) IVPB 1000 mg/200 mL premix  1,000 mg Intravenous 60 min Pre-Op Nada Libman, MD          Allergies: Allergies  Allergen Reactions  . Penicillins Other (See Comments)    Unknown reaction      Past Medical History: Past Medical History:  Diagnosis Date  . Cataract   . CHF (congestive heart failure) (HCC)   . Chronic pain   . Cocaine abuse    past history  . COPD (chronic obstructive pulmonary disease) (HCC)   . CVA (cerebrovascular accident) (HCC)    hx CVA x 2  . Depression   . Diabetes mellitus (HCC)    Sees Dr. Reather Littler [phone] -- fasting blood 70-110s  . DKA (diabetic ketoacidosis) (HCC)    severe, Dec 2011  . DVT (deep venous thrombosis) (HCC)   . ESRD (end stage  renal disease) on dialysis (HCC)    hd - [facility] - [place]  . GERD (gastroesophageal reflux disease)   . GI bleed    2006, mallory-weiss tear  . Goiter    by CT Apr 2013  . Hemodialysis patient (HCC)    M, W, F  . History of blood transfusion   . HTN (hypertension)    sees Dr. Ardelle Park, Rosalita Levan  . Hyperlipidemia   . Hypothyroidism   . Insomnia   . Neuromuscular disorder (HCC)    diabetic neuropathy  . Occlusion of carotid stent [facility])    Per patients nursing aid  . On home oxygen therapy    2l nasal cannula at night  . Pneumonia    hx of  . Pulmonary edema    nov 2011, dry wt decreased  . Tobacco abuse    history of     Past Surgical History: Past Surgical History:  Procedure Laterality Date  . ARTERIOVENOUS GRAFT PLACEMENT Left    thigh  . AV FISTULA PLACEMENT  03/25/2008   right forearm  .  AV FISTULA PLACEMENT  04/23/2012   Procedure: INSERTION OF ARTERIOVENOUS (AV) GORE-TEX GRAFT THIGH;  Surgeon: Sherren Kerns, MD;  Location: [facility] OR;  Service: Vascular;  Laterality: Left;  . AV FISTULA PLACEMENT Left 03/14/2013   Procedure: INSERTION OF  ARTERIOVENOUS (AV) GORE-TEX GRAFT  LEFT THIGH;  Surgeon: Larina Earthly, MD;  Location: [facility] OR;  Service: Vascular;  Laterality: Left;  . AV FISTULA PLACEMENT Right 12/30/2013   Procedure: INSERTION OF ARTERIOVENOUS (AV) GORE-TEX GRAFT THIGH;  Surgeon: Fransisco Hertz, MD;  Location: [facility] OR;  Service: Vascular;  Laterality: Right;  . AVGG REMOVAL Left 04/16/2013   Procedure: REMOVAL OF ARTERIOVENOUS GORETEX GRAFT Left Thigh;  Surgeon: Nada Libman, MD;  Location: [facility] OR;  Service: Vascular;  Laterality: Left;  . CAROTID STENT INSERTION Right   . COLONOSCOPY  09/16/13  . dyalisis catheter    . EYE SURGERY Bilateral    cataracts  . INSERTION OF DIALYSIS CATHETER Right 03/03/2013   Procedure: INSERTION OF DIALYSIS CATHETER;  Surgeon: Fransisco Hertz, MD;  Location: [facility] OR;  Service: Vascular;  Laterality: Right;  . INSERTION OF DIALYSIS CATHETER  Left 09/18/2013   Procedure: INSERTION OF DIALYSIS CATHETER-LEFT NECK VS LEFT THIGH;  Surgeon: Pryor Ochoa, MD;  Location: [facility] OR;  Service: Vascular;  Laterality: Left;  . LIGATION ARTERIOVENOUS GORTEX GRAFT Right 02/27/2013   Procedure: LIGATION ARTERIOVENOUS GORTEX GRAFT;  Surgeon: Chuck Hint, MD;  Location: [facility] OR;  Service: Vascular;  Laterality: Right;  . LIGATION ARTERIOVENOUS GORTEX GRAFT Left 03/03/2013   Procedure: LIGATION Left ARTERIOVENOUS GORTEX Thigh GRAFT;  Surgeon: Fransisco Hertz, MD;  Location: [facility] OR;  Service: Vascular;  Laterality: Left;  . LYMPH NODE BIOPSY Right 01/28/2013   Procedure: LYMPH NODE BIOPSY;  Surgeon: Velora Heckler, MD;  Location: [facility] OR;  Service: General;  Laterality: Right;  . MASS BIOPSY N/A 01/28/2013   Procedure: NECK EXPLORATION;  Surgeon: Velora Heckler, MD;  Location: [facility] OR;  Service: General;  Laterality: N/A;  . PATCH ANGIOPLASTY Left 04/16/2013   Procedure: Patch Angioplasty of left femoral artery;  Surgeon: Nada Libman, MD;  Location: [facility] OR;  Service: Vascular;  Laterality: Left;  . REMOVAL OF A DIALYSIS CATHETER Right 09/18/2013   Procedure: REMOVAL OF A DIALYSIS CATHETER-RIGHT FEMORAL VEIN;  Surgeon: Pryor Ochoa, MD;  Location: [facility] OR;  Service: Vascular;  Laterality: Right;  . SHUNTOGRAM Right 03/28/2012   Procedure: SHUNTOGRAM;  Surgeon: Fransisco Hertz, MD;  Location: [facility] CATH LAB;  Service: Cardiovascular;  Laterality: Right;  . THYROID SURGERY  01/28/2013   Dr Gerrit Friends     Family History: Family History  Problem Relation Age of Onset  . Diabetes Sister   . Diabetes Brother   . Hypertension Brother   . Heart disease Brother   . Hypertension Mother   . Heart attack Mother   . Hyperlipidemia Mother   . Heart disease Mother        before age 78  . Hypertension Father      Social History: Social History   Social History  . Marital status: Single    Spouse name: N/A  . Number of children: N/A  . Years of education: N/A    Occupational History  . Not on file.   Social History Main Topics  . Smoking status: Current Every Day Smoker    Packs/day: 0.25    Types: Cigarettes  . Smokeless tobacco: Never Used     Comment: pt  states that he has been thinking about giving it up  . Alcohol use No  . Drug use: No     Comment: in the past   . Sexual activity: Not on file   Other Topics Concern  . Not on file   Social History Narrative  . No narrative on file     Review of Systems: Review of Systems  Constitutional: Positive for malaise/fatigue. Negative for chills and fever.  HENT: Negative for congestion, hearing loss and nosebleeds.   Eyes: Positive for blurred vision. Negative for double vision.  Respiratory: Negative for cough, hemoptysis and sputum production.   Cardiovascular: Negative for chest pain, palpitations and orthopnea.  Gastrointestinal: Negative for abdominal pain, heartburn, nausea and vomiting.  Genitourinary: Negative for dysuria and urgency.  Musculoskeletal: Positive for joint pain.  Skin: Negative for itching and rash.  Neurological: Positive for weakness. Negative for dizziness and focal weakness.  Endo/Heme/Allergies: Negative for polydipsia. Does not bruise/bleed easily.  Psychiatric/Behavioral: Negative for depression. The patient is not nervous/anxious.      Vital Signs: Blood pressure 114/60, pulse 66, temperature 97.6 F (36.4 C), temperature source Oral, resp. rate 17, height 5\' 9"  (1.753 m), weight 59.4 kg (131 lb), SpO2 100 %.  Weight trends: Filed Weights   11/23/16 1030  Weight: 59.4 kg (131 lb)    Physical Exam: General: Chronically ill appearing  Head: Normocephalic, atraumatic.  Eyes: Anicteric, EOMI  Nose: Mucous membranes moist, not inflammed, nonerythematous.  Throat: Oropharynx nonerythematous, no exudate appreciated.   Neck: Supple, trachea midline.  Lungs:  Normal respiratory effort. Clear to auscultation BL without crackles or wheezes.   Heart: RRR. S1 and S2 normal without gallop, murmur, or rubs.  Abdomen:  BS normoactive. Soft, Nondistended, non-tender.  No masses or organomegaly.  Extremities: Left fifth toe with erythema, ulceration noted as well.  Neurologic: A&O X3, Motor strength is 5/5 in the all 4 extremities  Skin: No visible rashes, scars.    Lab results: Basic Metabolic Panel:  Recent Labs Lab 11/23/16 1044  NA 136  K 3.9  CL 98*  CO2 29  GLUCOSE 214*  BUN 13  CREATININE 3.09*  CALCIUM 8.0*    Liver Function Tests:  Recent Labs Lab 11/23/16 1044  AST 172*  ALT 41  ALKPHOS 138*  BILITOT 0.6  PROT 7.4  ALBUMIN 2.8*   No results for input(s): LIPASE, AMYLASE in the last 168 hours. No results for input(s): AMMONIA in the last 168 hours.  CBC:  Recent Labs Lab 11/23/16 1044  WBC 3.5*  NEUTROABS 2.3  HGB 11.8*  HCT 36.4*  MCV 91.4  PLT 185    Cardiac Enzymes: No results for input(s): CKTOTAL, CKMB, CKMBINDEX, TROPONINI in the last 168 hours.  BNP: Invalid input(s): POCBNP  CBG: No results for input(s): GLUCAP in the last 168 hours.  Microbiology: Results for orders placed or performed during the hospital encounter of 03/25/14  MRSA PCR Screening     Status: None   Collection Time: 03/25/14  9:29 PM  Result Value Ref Range Status   MRSA by PCR NEGATIVE NEGATIVE Final    Comment:        The GeneXpert MRSA Assay (FDA approved for NASAL specimens only), is one component of a comprehensive MRSA colonization surveillance program. It is not intended to diagnose MRSA infection nor to guide or monitor treatment for MRSA infections.     Coagulation Studies: No results for input(s): LABPROT, INR in the last 72 hours.  Urinalysis:  No results for input(s): COLORURINE, LABSPEC, PHURINE, GLUCOSEU, HGBUR, BILIRUBINUR, KETONESUR, PROTEINUR, UROBILINOGEN, NITRITE, LEUKOCYTESUR in the last 72 hours.  Invalid input(s): APPERANCEUR    Imaging: Dg Foot 2 Views Left  Result  Date: 11/23/2016 CLINICAL DATA:  Fifth toe infection. EXAM: LEFT FOOT - 2 VIEW COMPARISON:  Left foot x-rays dated Sep 05, 2012. FINDINGS: There is extensive bony destruction of the fifth metatarsal and proximal phalanx with surrounding soft tissue gas. Permeative appearance of the proximal fifth metatarsal. No additional sites of cortical destruction. No acute fracture. Hyperflexion at the first IP joint. Osteopenia. IMPRESSION: 1. Extensive bony destruction of the fifth metatarsal and proximal phalanx, consistent with osteomyelitis. Electronically Signed   By: Obie Dredge M.D.   On: 11/23/2016 11:45      Assessment & Plan: Pt is a 61 y.o. male with a PMHx of ESRD on HD MWF, diabetes mellitus type 1, COPD, tobacco abuse, hypertension, anemia of chronic kidney disease, secondary hyperparathyroidism,history of CVA, prior episodes of DKA, who was admitted to [facility] on 11/23/2016 for evaluation of left fifth toe infection.   MWF/[place]/L femoral permcath  1.  ESRD on HD and WF.  Potassium currently acceptable.  Volume status is also acceptable.  No urgent indication for dialysis at the moment.  We will plan for hemodialysis again tomorrow.  2.anemia chronic kidney disease.  Hemoglobin currently 11.8.  Hold off on Epogen.  3.  Secondary hyperparathyroidism. Check intact PTH and phosphorus with next dialysis treatment.  ontinue PhosLo 2 tablets by mouth 3 times Daily.  4.  Hypertension.  We will maintain the patient on amlodipine, carvedilol.  5.  Left foot osteomyelitis.  Patient has been started on cefepime as well as vancomycin.  Also recommend podiatry consultation as well as infectious disease consultation.

## 2016-11-23 NOTE — Consult Note (Signed)
Pharmacy Antibiotic Note  Marcus [name] is a 61 y.o. male admitted on 11/23/2016 with diabetic foot infection.  Pharmacy has been consulted for vancomycin and cefepime dosing. Pt is an ESRD on HD MWF  Plan: Pt received 1g of vancomycin in the ED. Will give another 500mg  for a total load of 1500mg . Vancomycin 500mg  q MWF w/ HD Level prior to the 3rd HD session, goal 15-25 mcg/ml Cefepime 1g q 24hr  Height: 5\' 9"  (175.3 cm) Weight: 131 lb (59.4 kg) IBW/kg (Calculated) : 70.7  Temp (24hrs), Avg:98 F (36.7 C), Min:98 F (36.7 C), Max:98 F (36.7 C)   Recent Labs Lab 11/23/16 1044  WBC 3.5*  CREATININE 3.09*    Estimated Creatinine Clearance: 21.1 mL/min (A) (by C-G formula based on SCr of 3.09 mg/dL (H)).    Allergies  Allergen Reactions  . Penicillins Other (See Comments)    Unknown reaction    Antimicrobials this admission: vancomycin 8/9 >>  zosyn 8/9 >>   Dose adjustments this admission:   Microbiology results:   Thank you for allowing pharmacy to be a part of this patient's care.  Olene FlossMelissa D Maryl Blalock, Pharm.D, BCPS Clinical Pharmacist  11/23/2016 1:50 PM

## 2016-11-23 NOTE — H&P (Signed)
[facility] [place] at [facility]   PATIENT NAME: Marcus [name]    MR#:  [id number]  DATE OF BIRTH:  Nov 30, 1955  DATE OF ADMISSION:  11/23/2016  PRIMARY CARE PHYSICIAN: Lindwood Qua, MD   REQUESTING/REFERRING PHYSICIAN: Dr Phineas Semen  CHIEF COMPLAINT:   Chief Complaint  Patient presents with  . Wound Infection    HISTORY OF PRESENT ILLNESS:  Marcus [name]  is a 61 y.o. male presented with fifth toe infection. The patient is a poor historian and is unable to tell me how long this has been going on. He has an open ulcer and drainage from that area on his left foot lateral side. States he's been having some weight loss. No fever or chills. No pain in his foot. Hospitalist services were contacted for further evaluation with an x-ray of the foot was consistent with osteomyelitis.  PAST MEDICAL HISTORY:   Past Medical History:  Diagnosis Date  . Cataract   . CHF (congestive heart failure) (HCC)   . Chronic pain   . Cocaine abuse    past history  . COPD (chronic obstructive pulmonary disease) (HCC)   . CVA (cerebrovascular accident) (HCC)    hx CVA x 2  . Depression   . Diabetes mellitus (HCC)    Sees Dr. Reather Littler [phone] -- fasting blood 70-110s  . DKA (diabetic ketoacidosis) (HCC)    severe, Dec 2011  . DVT (deep venous thrombosis) (HCC)   . ESRD (end stage renal disease) on dialysis (HCC)    hd - [facility] [place]  . GERD (gastroesophageal reflux disease)   . GI bleed    2006, mallory-weiss tear  . Goiter    by CT Apr 2013  . Hemodialysis patient (HCC)    M, W, F  . History of blood transfusion   . HTN (hypertension)    sees Dr. Ardelle Park, Rosalita Levan  . Hyperlipidemia   . Hypothyroidism   . Insomnia   . Neuromuscular disorder (HCC)    diabetic neuropathy  . Occlusion of carotid stent [facility])    Per patients nursing aid  . On home oxygen therapy    2l nasal cannula at night  . Pneumonia    hx of  . Pulmonary edema    nov 2011,  dry wt decreased  . Tobacco abuse    history of    PAST SURGICAL HISTORY:   Past Surgical History:  Procedure Laterality Date  . ARTERIOVENOUS GRAFT PLACEMENT Left    thigh  . AV FISTULA PLACEMENT  03/25/2008   right forearm  . AV FISTULA PLACEMENT  04/23/2012   Procedure: INSERTION OF ARTERIOVENOUS (AV) GORE-TEX GRAFT THIGH;  Surgeon: Sherren Kerns, MD;  Location: [facility] OR;  Service: Vascular;  Laterality: Left;  . AV FISTULA PLACEMENT Left 03/14/2013   Procedure: INSERTION OF  ARTERIOVENOUS (AV) GORE-TEX GRAFT  LEFT THIGH;  Surgeon: Larina Earthly, MD;  Location: [facility] OR;  Service: Vascular;  Laterality: Left;  . AV FISTULA PLACEMENT Right 12/30/2013   Procedure: INSERTION OF ARTERIOVENOUS (AV) GORE-TEX GRAFT THIGH;  Surgeon: Fransisco Hertz, MD;  Location: [facility] OR;  Service: Vascular;  Laterality: Right;  . AVGG REMOVAL Left 04/16/2013   Procedure: REMOVAL OF ARTERIOVENOUS GORETEX GRAFT Left Thigh;  Surgeon: Nada Libman, MD;  Location: [facility] OR;  Service: Vascular;  Laterality: Left;  . CAROTID STENT INSERTION Right   . COLONOSCOPY  09/16/13  . dyalisis catheter    . EYE SURGERY Bilateral  cataracts  . INSERTION OF DIALYSIS CATHETER Right 03/03/2013   Procedure: INSERTION OF DIALYSIS CATHETER;  Surgeon: Fransisco Hertz, MD;  Location[facility] OR;  Service: Vascular;  Laterality: Right;  . INSERTION OF DIALYSIS CATHETER Left 09/18/2013   Procedure: INSERTION OF DIALYSIS CATHETER-LEFT NECK VS LEFT THIGH;  Surgeon: Pryor Ochoa, MD;  Location: [facility] OR;  Service: Vascular;  Laterality: Left;  . LIGATION ARTERIOVENOUS GORTEX GRAFT Right 02/27/2013   Procedure: LIGATION ARTERIOVENOUS GORTEX GRAFT;  Surgeon: Chuck Hint, MD;  Location: [facility] OR;  Service: Vascular;  Laterality: Right;  . LIGATION ARTERIOVENOUS GORTEX GRAFT Left 03/03/2013   Procedure: LIGATION Left ARTERIOVENOUS GORTEX Thigh GRAFT;  Surgeon: Fransisco Hertz, MD;  Location: [facility] OR;  Service: Vascular;  Laterality: Left;  . LYMPH NODE  BIOPSY Right 01/28/2013   Procedure: LYMPH NODE BIOPSY;  Surgeon: Velora Heckler, MD;  Location: [facility] OR;  Service: General;  Laterality: Right;  . MASS BIOPSY N/A 01/28/2013   Procedure: NECK EXPLORATION;  Surgeon: Velora Heckler, MD;  Location: [facility] OR;  Service: General;  Laterality: N/A;  . PATCH ANGIOPLASTY Left 04/16/2013   Procedure: Patch Angioplasty of left femoral artery;  Surgeon: Nada Libman, MD;  Location: [facility] OR;  Service: Vascular;  Laterality: Left;  . REMOVAL OF A DIALYSIS CATHETER Right 09/18/2013   Procedure: REMOVAL OF A DIALYSIS CATHETER-RIGHT FEMORAL VEIN;  Surgeon: Pryor Ochoa, MD;  Location: [facility] OR;  Service: Vascular;  Laterality: Right;  . SHUNTOGRAM Right 03/28/2012   Procedure: SHUNTOGRAM;  Surgeon: Fransisco Hertz, MD;  Location: [facility] CATH LAB;  Service: Cardiovascular;  Laterality: Right;  . THYROID SURGERY  01/28/2013   Dr Gerrit Friends    SOCIAL HISTORY:   Social History  Substance Use Topics  . Smoking status: Current Every Day Smoker    Packs/day: 0.25    Types: Cigarettes  . Smokeless tobacco: Never Used     Comment: pt states that he has been thinking about giving it up  . Alcohol use No    FAMILY HISTORY:   Family History  Problem Relation Age of Onset  . Diabetes Sister   . Diabetes Brother   . Hypertension Brother   . Heart disease Brother   . Hypertension Mother   . Heart attack Mother   . Hyperlipidemia Mother   . Heart disease Mother        before age 17  . Hypertension Father     DRUG ALLERGIES:   Allergies  Allergen Reactions  . Penicillins Other (See Comments)    Unknown reaction    REVIEW OF SYSTEMS:  CONSTITUTIONAL: No fever, fatigue or weakness. Positive for weight loss. EYES: No blurred or double vision.  EARS, NOSE, AND THROAT: No tinnitus or ear pain. No sore throat. RESPIRATORY: No cough, shortness of breath, wheezing or hemoptysis.  CARDIOVASCULAR: No chest pain, orthopnea, edema.  GASTROINTESTINAL: No nausea, vomiting, or  abdominal pain. No blood in bowel movements. Positive for diarrhea. GENITOURINARY: No urine ENDOCRINE: No polyuria, nocturia,  HEMATOLOGY: No anemia, easy bruising or bleeding SKIN: No rash or lesion. MUSCULOSKELETAL: No joint pain or arthritis.   NEUROLOGIC: No tingling, numbness, weakness.  PSYCHIATRY: No anxiety or depression.   MEDICATIONS AT HOME:   Prior to Admission medications   Medication Sig Start Date End Date Taking? Authorizing Provider  amLODipine (NORVASC) 10 MG tablet Take 1 tablet (10 mg total) by mouth at bedtime. 02/02/14  Yes Jeralyn Bennett, MD  aspirin 81 MG chewable tablet  Chew 81 mg by mouth daily.   Yes [provider]  b complex vitamins tablet Take 1 tablet by mouth daily.   Yes [provider]  calcium acetate (PHOSLO) 667 MG capsule Take by mouth 2 (two) times daily.   Yes [provider]  carvedilol (COREG) 12.5 MG tablet Take 25 mg by mouth 2 (two) times daily with a meal.   Yes [provider]  cetirizine (ZYRTEC) 10 MG tablet Take 10 mg by mouth daily.   Yes [provider]  FLUoxetine (PROZAC) 20 MG capsule Take 20 mg by mouth daily.   Yes [provider]  HYDROcodone-acetaminophen (NORCO/VICODIN) 5-325 MG tablet Take 1 tablet by mouth every 6 (six) hours as needed for moderate pain.   Yes [provider]  insulin glargine (LANTUS) 100 UNIT/ML injection Inject 0.15 mLs (15 Units total) into the skin at bedtime. Patient taking differently: Inject 5 Units into the skin at bedtime.  03/28/14  Yes Clydia Llano, MD  levothyroxine (SYNTHROID, LEVOTHROID) 125 MCG tablet Take 125 mcg by mouth daily before breakfast.   Yes [provider]  levothyroxine (SYNTHROID, LEVOTHROID) 150 MCG tablet Take 150 mcg by mouth daily before breakfast.   Yes [provider]  Melatonin 3 MG TABS Take 3 mg by mouth at bedtime.   Yes [provider]  mirtazapine (REMERON) 15 MG tablet Take 15  mg by mouth at bedtime.   Yes [provider]  pravastatin (PRAVACHOL) 10 MG tablet Take 1 tablet (10 mg total) by mouth at bedtime. 02/02/14  Yes Jeralyn Bennett, MD  saccharomyces boulardii (FLORASTOR) 250 MG capsule Take 250 mg by mouth daily.   Yes [provider]  traZODone (DESYREL) 100 MG tablet Take 100 mg by mouth at bedtime.    Yes [provider]  albuterol (VENTOLIN HFA) 108 (90 BASE) MCG/ACT inhaler Inhale 2 puffs into the lungs every 4 (four) hours as needed for wheezing or shortness of breath.    [provider]  docusate sodium (COLACE) 100 MG capsule Take 1 capsule (100 mg total) by mouth 2 (two) times daily. 02/02/14   Jeralyn Bennett, MD  insulin aspart (NOVOLOG) 100 UNIT/ML injection Inject 5 Units into the skin 3 (three) times daily with meals. Patient taking differently: Inject 1-5 Units into the skin 3 (three) times daily with meals.  03/28/14   Clydia Llano, MD  Nutritional Supplements (NUTRITIONAL SHAKE PO) Take 1 Bottle by mouth 3 (three) times daily with meals.    [provider]  ondansetron (ZOFRAN) 4 MG tablet Take 4 mg by mouth every 6 (six) hours as needed for nausea or vomiting.    [provider]  polyethylene glycol (MIRALAX / GLYCOLAX) packet Take 17 g by mouth daily.    [provider]  promethazine (PHENADOZ) 25 MG suppository Place 25 mg rectally every 8 (eight) hours as needed for nausea or vomiting.    [provider]      VITAL SIGNS:  Blood pressure 120/65, pulse 65, temperature 98 F (36.7 C), temperature source Oral, resp. rate 18, height 5\' 9"  (1.753 m), weight 59.4 kg (131 lb), SpO2 100 %.  PHYSICAL EXAMINATION:  GENERAL:  61 y.o.-year-old patient lying in the bed with no acute distress.  EYES: Pupils equal, round, reactive to light and accommodation. No scleral icterus. Extraocular muscles intact.  HEENT: Head atraumatic, normocephalic. Oropharynx and nasopharynx clear.   NECK:  Supple, no jugular venous distention. No thyroid enlargement, no tenderness.  LUNGS: Normal breath sounds bilaterally, no wheezing, rales,rhonchi or crepitation. No use of accessory muscles of respiration.  CARDIOVASCULAR: S1, S2 normal. No murmurs, rubs, or gallops.  ABDOMEN: Soft, nontender, nondistended. Bowel sounds present. No organomegaly or mass.  EXTREMITIES: No pedal edema, cyanosis, or clubbing.  NEUROLOGIC: Cranial nerves II through XII are intact. Muscle strength 5/5 in all extremities. Sensation intact. Gait not checked.  PSYCHIATRIC: The patient is alert and oriented x 3.  SKIN: Open lesion left lateral foot along the fifth metatarsal and MT joint. Some drainage there.  LABORATORY PANEL:   CBC  Recent Labs Lab 11/23/16 1044  WBC 3.5*  HGB 11.8*  HCT 36.4*  PLT 185   ------------------------------------------------------------------------------------------------------------------  Chemistries   Recent Labs Lab 11/23/16 1044  NA 136  K 3.9  CL 98*  CO2 29  GLUCOSE 214*  BUN 13  CREATININE 3.09*  CALCIUM 8.0*  AST 172*  ALT 41  ALKPHOS 138*  BILITOT 0.6   ------------------------------------------------------------------------------------------------------------------   RADIOLOGY:  Dg Foot 2 Views Left  Result Date: 11/23/2016 CLINICAL DATA:  Fifth toe infection. EXAM: LEFT FOOT - 2 VIEW COMPARISON:  Left foot x-rays dated Sep 05, 2012. FINDINGS: There is extensive bony destruction of the fifth metatarsal and proximal phalanx with surrounding soft tissue gas. Permeative appearance of the proximal fifth metatarsal. No additional sites of cortical destruction. No acute fracture. Hyperflexion at the first IP joint. Osteopenia. IMPRESSION: 1. Extensive bony destruction of the fifth metatarsal and proximal phalanx, consistent with osteomyelitis. Electronically Signed   By: Obie DredgeWilliam T Derry M.D.   On: 11/23/2016 11:45    EKG:   P waves seen on this EKG  so this is normal sinus rhythm 65 bpm.  IMPRESSION AND PLAN:   1. Preop consultation for osteomyelitis of the left fifth toe. Case discussed with podiatry Dr. Graciela HusbandsKlein to evaluate the patient. No contraindications to surgery at this time. Surgery must be done to prevent sepsis and death. Empiric antibiotics with vancomycin and either cefepime or Zosyn depending on allergies 2. End-stage renal disease on dialysis Monday Wednesday Friday. Case discussed with Dr. Cherylann RatelLateef nephrology to set up for dialysis. 3. Essential hypertension on Norvasc 4. Hyperlipidemia unspecified on pravastatin 5. Hypothyroidism unspecified on Synthroid 6. Type 2 diabetes mellitus on Lantus and I will put on sliding scale since will be nothing by mouth after midnight for surgery tomorrow 7. Oxygen 2 L at night 8. Tobacco abuse. Smoking cessation counseling done 4 minutes by me    All the records are reviewed and case discussed with ED provider. Management plans discussed with the patient, and he is in agreement.  CODE STATUS: Full code  TOTAL TIME TAKING CARE OF THIS PATIENT: 50 minutes.    Alford HighlandWIETING, Audryana Hockenberry M.D on 11/23/2016 at 2:30 PM  Between 7am to 6pm - Pager - [phone]  After 6pm call admission pager [phone]  [facility] Office  [phone]  CC: Primary care physician; Lindwood QuaHoffman, Byron, MD

## 2016-11-23 NOTE — ED Triage Notes (Signed)
Patient from [facility] via [facility]. Patient was being treated for wound on left foot. Patient sent for further evaluation of wound. Patient lives at [facility]. Patient alert and oriented at baseline.

## 2016-11-24 ENCOUNTER — Inpatient Hospital Stay: Payer: Medicare Other | Admitting: Anesthesiology

## 2016-11-24 ENCOUNTER — Encounter
Admission: EM | Disposition: A | Discharge status: Skilled Nursing Facility | Payer: Self-pay | Source: Home / Self Care | Attending: Internal Medicine

## 2016-11-24 DIAGNOSIS — L8990 Pressure ulcer of unspecified site, unspecified stage: Secondary | ICD-10-CM | POA: Insufficient documentation

## 2016-11-24 HISTORY — PX: AMPUTATION: SHX166

## 2016-11-24 LAB — BASIC METABOLIC PANEL
ANION GAP: 9 (ref 5–15)
BUN: 19 mg/dL (ref 6–20)
CHLORIDE: 100 mmol/L — AB (ref 101–111)
CO2: 29 mmol/L (ref 22–32)
CREATININE: 3.5 mg/dL — AB (ref 0.61–1.24)
Calcium: 8.1 mg/dL — ABNORMAL LOW (ref 8.9–10.3)
GFR calc non Af Amer: 17 mL/min — ABNORMAL LOW (ref >60)
GFR, EST AFRICAN AMERICAN: 20 mL/min — AB (ref >60)
Glucose, Bld: 71 mg/dL (ref 65–99)
POTASSIUM: 3.3 mmol/L — AB (ref 3.5–5.1)
SODIUM: 138 mmol/L (ref 135–145)

## 2016-11-24 LAB — CBC
HEMATOCRIT: 34.5 % — AB (ref 40.0–52.0)
HEMOGLOBIN: 11.3 g/dL — AB (ref 13.0–18.0)
MCH: 29.7 pg (ref 26.0–34.0)
MCHC: 32.7 g/dL (ref 32.0–36.0)
MCV: 90.9 fL (ref 80.0–100.0)
PLATELETS: 180 10*3/uL (ref 150–440)
RBC: 3.8 MIL/uL — AB (ref 4.40–5.90)
RDW: 17.2 % — ABNORMAL HIGH (ref 11.5–14.5)
WBC: 4 10*3/uL (ref 3.8–10.6)

## 2016-11-24 LAB — GLUCOSE, CAPILLARY
GLUCOSE-CAPILLARY: 87 mg/dL (ref 65–99)
GLUCOSE-CAPILLARY: 64 mg/dL — AB (ref 65–99)
Glucose-Capillary: 47 mg/dL — ABNORMAL LOW (ref 65–99)
Glucose-Capillary: 38 mg/dL — CL (ref 65–99)
Glucose-Capillary: 132 mg/dL — ABNORMAL HIGH (ref 65–99)
Glucose-Capillary: 107 mg/dL — ABNORMAL HIGH (ref 65–99)

## 2016-11-24 LAB — PHOSPHORUS

## 2016-11-24 SURGERY — AMPUTATION, FOOT, RAY
Anesthesia: General | Site: Foot | Laterality: Left | Wound class: Dirty or Infected

## 2016-11-24 MED ORDER — SODIUM CHLORIDE 0.9 % IV SOLN
INTRAVENOUS | Status: DC | PRN
  Administered 2016-11-24: 19:00:00 via INTRAVENOUS

## 2016-11-24 MED ORDER — FENTANYL CITRATE (PF) 100 MCG/2ML IJ SOLN
INTRAMUSCULAR | Status: AC
  Filled 2016-11-24: qty 2

## 2016-11-24 MED ORDER — PENTAFLUOROPROP-TETRAFLUOROETH EX AERO
1.0000 "application " | INHALATION_SPRAY | CUTANEOUS | Status: DC | PRN
  Filled 2016-11-24: qty 30

## 2016-11-24 MED ORDER — FENTANYL CITRATE (PF) 100 MCG/2ML IJ SOLN
INTRAMUSCULAR | Status: DC | PRN
  Administered 2016-11-24: 25 ug via INTRAVENOUS

## 2016-11-24 MED ORDER — CHLORHEXIDINE GLUCONATE CLOTH 2 % EX PADS
6.0000 | MEDICATED_PAD | Freq: Every day | CUTANEOUS | Status: AC
  Administered 2016-11-24 – 2016-11-27 (×3): 6 via TOPICAL

## 2016-11-24 MED ORDER — HEPARIN SODIUM (PORCINE) 1000 UNIT/ML DIALYSIS
1000.0000 [IU] | INTRAMUSCULAR | Status: DC | PRN
  Filled 2016-11-24: qty 1

## 2016-11-24 MED ORDER — DEXTROSE 50 % IV SOLN
INTRAVENOUS | Status: AC
  Administered 2016-11-24: 25 mL
  Filled 2016-11-24: qty 50

## 2016-11-24 MED ORDER — INSULIN GLARGINE 100 UNIT/ML ~~LOC~~ SOLN
3.0000 [IU] | Freq: Every day | SUBCUTANEOUS | Status: DC
  Administered 2016-11-24 – 2016-11-25 (×2): 3 [IU] via SUBCUTANEOUS
  Filled 2016-11-24 (×2): qty 0.03

## 2016-11-24 MED ORDER — NEOMYCIN-POLYMYXIN B GU 40-200000 IR SOLN
Status: DC | PRN
  Administered 2016-11-24: 2 mL

## 2016-11-24 MED ORDER — SODIUM CHLORIDE 0.9 % IV SOLN: 100.0000 mL | INTRAVENOUS | Status: DC | PRN

## 2016-11-24 MED ORDER — DEXTROSE 50 % IV SOLN: 25.0000 mL | Freq: Once | INTRAVENOUS | Status: AC

## 2016-11-24 MED ORDER — BUPIVACAINE HCL 0.5 % IJ SOLN
INTRAMUSCULAR | Status: DC | PRN
  Administered 2016-11-24: 10 mL

## 2016-11-24 MED ORDER — LIDOCAINE HCL (PF) 1 % IJ SOLN
5.0000 mL | INTRAMUSCULAR | Status: DC | PRN
  Filled 2016-11-24: qty 5

## 2016-11-24 MED ORDER — MIDAZOLAM HCL 2 MG/2ML IJ SOLN
INTRAMUSCULAR | Status: AC
  Filled 2016-11-24: qty 2

## 2016-11-24 MED ORDER — OXYCODONE-ACETAMINOPHEN 5-325 MG PO TABS: 1.0000 | ORAL_TABLET | ORAL | Status: DC | PRN

## 2016-11-24 MED ORDER — PROPOFOL 10 MG/ML IV BOLUS
INTRAVENOUS | Status: AC
  Filled 2016-11-24: qty 20

## 2016-11-24 MED ORDER — SODIUM PHOSPHATES 45 MMOLE/15ML IV SOLN
20.0000 mmol | Freq: Once | INTRAVENOUS | Status: AC
  Administered 2016-11-24: 20 mmol via INTRAVENOUS
  Filled 2016-11-24: qty 6.67

## 2016-11-24 MED ORDER — ALTEPLASE 2 MG IJ SOLR: 2.0000 mg | Freq: Once | INTRAMUSCULAR | Status: DC | PRN

## 2016-11-24 MED ORDER — FENTANYL CITRATE (PF) 100 MCG/2ML IJ SOLN: 25.0000 ug | INTRAMUSCULAR | Status: DC | PRN

## 2016-11-24 MED ORDER — ONDANSETRON HCL 4 MG/2ML IJ SOLN: 4.0000 mg | Freq: Once | INTRAMUSCULAR | Status: DC | PRN

## 2016-11-24 MED ORDER — MIDAZOLAM HCL 2 MG/2ML IJ SOLN
INTRAMUSCULAR | Status: DC | PRN
  Administered 2016-11-24: 1 mg via INTRAVENOUS

## 2016-11-24 MED ORDER — MUPIROCIN 2 % EX OINT
1.0000 "application " | TOPICAL_OINTMENT | Freq: Two times a day (BID) | CUTANEOUS | Status: AC
  Administered 2016-11-24 – 2016-11-28 (×9): 1 via NASAL
  Filled 2016-11-24: qty 22

## 2016-11-24 MED ORDER — PROPOFOL 10 MG/ML IV BOLUS
INTRAVENOUS | Status: DC | PRN
  Administered 2016-11-24: 100 mg via INTRAVENOUS

## 2016-11-24 MED ORDER — VANCOMYCIN HCL 1000 MG IV SOLR
INTRAVENOUS | Status: AC
  Filled 2016-11-24: qty 1000

## 2016-11-24 MED ORDER — LIDOCAINE HCL (CARDIAC) 20 MG/ML IV SOLN
INTRAVENOUS | Status: DC | PRN
  Administered 2016-11-24: 50 mg via INTRAVENOUS

## 2016-11-24 MED ORDER — LIDOCAINE-PRILOCAINE 2.5-2.5 % EX CREA
1.0000 "application " | TOPICAL_CREAM | CUTANEOUS | Status: DC | PRN
  Filled 2016-11-24: qty 5

## 2016-11-24 SURGICAL SUPPLY — 55 items
BANDAGE ACE 4X5 VEL STRL LF (GAUZE/BANDAGES/DRESSINGS) ×3 IMPLANT
BLADE MED AGGRESSIVE (BLADE) ×3 IMPLANT
BLADE OSC/SAGITTAL MD 5.5X18 (BLADE) ×1 IMPLANT
BLADE SURG 15 STRL LF DISP TIS (BLADE) ×2 IMPLANT
BLADE SURG 15 STRL SS (BLADE) ×6
BLADE SURG MINI STRL (BLADE) ×3 IMPLANT
BNDG CMPR 75X21 PLY HI ABS (MISCELLANEOUS) ×1
BNDG ESMARK 4X12 TAN STRL LF (GAUZE/BANDAGES/DRESSINGS) ×3 IMPLANT
BNDG GAUZE 4.5X4.1 6PLY STRL (MISCELLANEOUS) ×5 IMPLANT
CANISTER SUCT 1200ML W/VALVE (MISCELLANEOUS) ×5 IMPLANT
CLOSURE WOUND 1/4X4 (GAUZE/BANDAGES/DRESSINGS)
CNTNR SPEC 2.5X3XGRAD LEK (MISCELLANEOUS) ×1
CONT SPEC 4OZ STER OR WHT (MISCELLANEOUS) ×2
CONT SPEC 4OZ STRL OR WHT (MISCELLANEOUS) ×1
CONTAINER SPEC 2.5X3XGRAD LEK (MISCELLANEOUS) IMPLANT
CUFF TOURN 18 STER (MISCELLANEOUS) ×3 IMPLANT
CUFF TOURN DUAL PL 12 NO SLV (MISCELLANEOUS) ×1 IMPLANT
DRAPE FLUOR MINI C-ARM 54X84 (DRAPES) ×1 IMPLANT
DURAPREP 26ML APPLICATOR (WOUND CARE) ×3 IMPLANT
ELECT REM PT RETURN 9FT ADLT (ELECTROSURGICAL) ×3
ELECTRODE REM PT RTRN 9FT ADLT (ELECTROSURGICAL) ×1 IMPLANT
GAUZE PETRO XEROFOAM 1X8 (MISCELLANEOUS) ×3 IMPLANT
GAUZE SPONGE 4X4 12PLY STRL (GAUZE/BANDAGES/DRESSINGS) ×5 IMPLANT
GAUZE STRETCH 2X75IN STRL (MISCELLANEOUS) ×3 IMPLANT
GAUZE XEROFORM 4X4 STRL (GAUZE/BANDAGES/DRESSINGS) ×2 IMPLANT
GLOVE BIO SURGEON STRL SZ7.5 (GLOVE) ×3 IMPLANT
GLOVE INDICATOR 8.0 STRL GRN (GLOVE) ×3 IMPLANT
GOWN STRL REUS W/ TWL LRG LVL3 (GOWN DISPOSABLE) ×2 IMPLANT
GOWN STRL REUS W/TWL LRG LVL3 (GOWN DISPOSABLE) ×9
HANDPIECE VERSAJET DEBRIDEMENT (MISCELLANEOUS) ×3 IMPLANT
KIT RM TURNOVER STRD PROC AR (KITS) ×3 IMPLANT
KIT STIMULAN RAPID CURE 5CC (Orthopedic Implant) ×2 IMPLANT
LABEL OR SOLS (LABEL) ×1 IMPLANT
NDL FILTER BLUNT 18X1 1/2 (NEEDLE) ×1 IMPLANT
NDL HYPO 25X1 1.5 SAFETY (NEEDLE) ×2 IMPLANT
NEEDLE FILTER BLUNT 18X 1/2SAF (NEEDLE) ×2
NEEDLE FILTER BLUNT 18X1 1/2 (NEEDLE) ×1 IMPLANT
NEEDLE HYPO 25X1 1.5 SAFETY (NEEDLE) ×6 IMPLANT
NS IRRIG 500ML POUR BTL (IV SOLUTION) ×3 IMPLANT
PACK EXTREMITY ARMC (MISCELLANEOUS) ×3 IMPLANT
PAD ABD DERMACEA PRESS 5X9 (GAUZE/BANDAGES/DRESSINGS) ×4 IMPLANT
SOL .9 NS 3000ML IRR  AL (IV SOLUTION) ×2
SOL .9 NS 3000ML IRR AL (IV SOLUTION) ×1
SOL .9 NS 3000ML IRR UROMATIC (IV SOLUTION) ×1 IMPLANT
SOL PREP PVP 2OZ (MISCELLANEOUS) ×3
SOLUTION PREP PVP 2OZ (MISCELLANEOUS) ×1 IMPLANT
STOCKINETTE STRL 6IN 960660 (GAUZE/BANDAGES/DRESSINGS) ×3 IMPLANT
STRIP CLOSURE SKIN 1/4X4 (GAUZE/BANDAGES/DRESSINGS) ×1 IMPLANT
SUT ETHILON 3-0 FS-10 30 BLK (SUTURE) ×3
SUT ETHILON 4-0 (SUTURE) ×3
SUT ETHILON 4-0 FS2 18XMFL BLK (SUTURE) ×1
SUTURE EHLN 3-0 FS-10 30 BLK (SUTURE) ×1 IMPLANT
SUTURE ETHLN 4-0 FS2 18XMF BLK (SUTURE) IMPLANT
SWAB DUAL CULTURE TRANS RED ST (MISCELLANEOUS) ×3 IMPLANT
SYRINGE 10CC LL (SYRINGE) ×3 IMPLANT

## 2016-11-24 NOTE — Progress Notes (Signed)
Pre hd assessment  

## 2016-11-24 NOTE — Progress Notes (Addendum)
Inpatient Diabetes Program Recommendations  AACE/ADA: New Consensus Statement on Inpatient Glycemic Control (2015)  Target Ranges:  Prepandial:   less than 140 mg/dL      Peak postprandial:   less than 180 mg/dL (1-2 hours)      Critically ill patients:  140 - 180 mg/dL   Lab Results  Component Value Date   GLUCAP 258 (H) 11/23/2016   HGBA1C 8.6 (H) 03/27/2014     Fructosamine 657 on 10/23/16 (normal 200-285) mcmol/L  Review of Glycemic Control- lab glucose  Results for Vicie MuttersSTALEY, Marcus O (MRN [id number]) as of 11/24/2016 07:48  Ref. Range 11/23/2016 16:53 11/23/2016 21:20  Glucose-Capillary Latest Ref Range: 65 - 99 mg/dL 409225 (H) 811258 (H)   Fasting CBG 47mg /dl  Di[facility]: [facility] on 10/23/16  Outpatient Diabetes medications: Reports Lantus 5 units qhs, Novolog 1-5 units tid with meals  Current orders for Inpatient glycemic control: Lantus 4 units qhs, Novolog 0-9 units tid, Novolog 0-5 units qhs  Inpatient Diabetes Program Recommendations:   Patient has Type 1 diabetes and makes NO insulin.   Please consider d/c Novolog 0-9 units and 0-5 units and order Novolog 2 units tid with meals (if patient eats greater than 50%)- he appears to be very sensitive to insulin.    Low lab glucose/finger stick this am- likely as a result of 6 units of Novolog insulin.  Dr. Sherryll BurgerShah text paged regarding recommendations  Susette RacerJulie Sakinah Rosamond, RN, BA, MHA, CDE Diabetes Coordinator Inpatient Diabetes Program  [phone] (Team Pager) [phone] [facility] Office) 11/24/2016 7:52 AM

## 2016-11-24 NOTE — Consult Note (Signed)
Pharmacy Antibiotic Note  Marcus [name] is a 61 y.o. male admitted on 11/23/2016 with diabetic foot infection.  Pharmacy has been consulted for vancomycin and cefepime dosing. Pt is an ESRD on HD MWF  Plan: Vancomycin 500 mg IV qMWF  Level prior to the 3rd HD session, goal 15-25 mcg/ml Cefepime 1g q 24hr  Height: 5\' 9"  (175.3 cm) Weight: 125 lb 10.6 oz (57 kg) IBW/kg (Calculated) : 70.7  Temp (24hrs), Avg:97.7 F (36.5 C), Min:97.3 F (36.3 C), Max:98.4 F (36.9 C)   Recent Labs Lab 11/23/16 1044 11/24/16 0500  WBC 3.5* 4.0  CREATININE 3.09* 3.50*    Estimated Creatinine Clearance: 17.9 mL/min (A) (by C-G formula based on SCr of 3.5 mg/dL (H)).    Allergies  Allergen Reactions  . Penicillins Other (See Comments)    Unknown reaction    Antimicrobials this admission: vancomycin 8/9 >>  zosyn 8/9 >>   Dose adjustments this admission:   Microbiology results:   Thank you for allowing pharmacy to be a part of this patient's care. Demetrius Charityeldrin D. Omega Durante, PharmD  Clinical Pharmacist  11/24/2016 11:39 AM

## 2016-11-24 NOTE — Plan of Care (Signed)
Problem: Education: Goal: Knowledge of [facility] Education information/materials will improve Outcome: Not Progressing Patient needs assistance.   Problem: Health Behavior/Discharge Planning: Goal: Ability to manage health-related needs will improve Outcome: Not Progressing Patient needs assistance.   

## 2016-11-24 NOTE — Anesthesia Procedure Notes (Signed)
Procedure Name: LMA Insertion Performed by: Gavinn Collard Pre-anesthesia Checklist: Patient identified, Patient being monitored, Timeout performed, Emergency Drugs available and Suction available Patient Re-evaluated:Patient Re-evaluated prior to induction Oxygen Delivery Method: Circle system utilized Preoxygenation: Pre-oxygenation with 100% oxygen Induction Type: IV induction LMA: LMA inserted LMA Size: 4.5 Tube type: Oral Number of attempts: 1 Placement Confirmation: positive ETCO2 and breath sounds checked- equal and bilateral Tube secured with: Tape Dental Injury: Teeth and Oropharynx as per pre-operative assessment        

## 2016-11-24 NOTE — Interval H&P Note (Signed)
History and Physical Interval Note:  11/24/2016 6:34 PM  Marcus [name]  has presented today for surgery, with the diagnosis of N/A  The various methods of treatment have been discussed with the patient and family. After consideration of risks, benefits and other options for treatment, the patient has consented to  Procedure(s): RAY RESECTION-LEFT 5TH RAY (Left) as a surgical intervention .  The patient's history has been reviewed, patient examined, no change in status, stable for surgery.  I have reviewed the patient's chart and labs.  Questions were answered to the patient's satisfaction.     [name] W Marzella Miracle

## 2016-11-24 NOTE — Progress Notes (Signed)
11/24/2016   Notified by lab that pt had critical phosphorous value of <1.0.  Paged attending MD Sherryll BurgerShah via voice and text.  Pt currently receiving hemodialysis treatment.  Awaiting response.  Bradly Chrisougherty, Negar Sieler E, RN

## 2016-11-24 NOTE — Anesthesia Preprocedure Evaluation (Signed)
Anesthesia Evaluation  Patient identified by MRN, date of birth, ID band Patient awake    Reviewed: Allergy & Precautions, NPO status , Patient's Chart, lab work & pertinent test results  History of Anesthesia Complications Negative for: history of anesthetic complications  Airway Mallampati: III       Dental  (+) Edentulous Upper, Edentulous Lower   Pulmonary COPD,  COPD inhaler, Current Smoker,           Cardiovascular hypertension, Pt. on medications +CHF       Neuro/Psych Depression CVA    GI/Hepatic GERD  Medicated and Controlled,  Endo/Other  diabetes, Type 2, Oral Hypoglycemic AgentsHypothyroidism   Renal/GU ESRF and DialysisRenal disease     Musculoskeletal   Abdominal   Peds  Hematology   Anesthesia Other Findings   Reproductive/Obstetrics                             Anesthesia Physical Anesthesia Plan  ASA: IV  Anesthesia Plan: General   Post-op Pain Management:    Induction:   PONV Risk Score and Plan: 1 and Ondansetron and Dexamethasone  Airway Management Planned: LMA  Additional Equipment:   Intra-op Plan:   Post-operative Plan:   Informed Consent: I have reviewed the patients History and Physical, chart, labs and discussed the procedure including the risks, benefits and alternatives for the proposed anesthesia with the patient or authorized representative who has indicated his/her understanding and acceptance.     Plan Discussed with:   Anesthesia Plan Comments:         Anesthesia Quick Evaluation

## 2016-11-24 NOTE — [place] FL2 (Signed)
[place] MEDICAID FL2 LEVEL OF CARE SCREENING TOOL     IDENTIFICATION  Patient Name: Marcus [name] Birthdate: 04/24/1955 Sex: male Admission Date (Current Location): 11/23/2016  [place]   Facility and Address:  [facility], [address], [place], [place] [address]      Provider Number: [id number]  Attending Physician Name and Address:  Delfino LovettShah, Vipul, MD  Relative Name and Phone Number:       Current Level of Care: Hospital Recommended Level of Care: Skilled Nursing Facility Prior Approval Number:    Date Approved/Denied:   PASRR Number: [id number]  Discharge Plan: SNF    Current Diagnoses: Patient Active Problem List   Diagnosis Date Noted  . Pressure injury of skin 11/24/2016  . Osteomyelitis (HCC) 11/23/2016  . DKA (diabetic ketoacidoses) (HCC) 03/25/2014  . Hyperkalemia 03/25/2014  . COPD (chronic obstructive pulmonary disease) (HCC) 03/25/2014  . Tobacco use disorder 03/25/2014  . 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 Acute encephalopathy 03/25/2014  . DKA, type 1 (HCC) 01/30/2014  . HHNC (hyperglycemic hyperosmolar nonketotic coma) (HCC) 07/13/2013  . 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 Protein-calorie malnutrition, severe (HCC) 02/27/2013  . Thyroid mass 02/25/2013  . Thyroid goiter 01/16/2013  . Unspecified essential hypertension 01/02/2013  . Hypothyroidism 12/31/2012  . ESRD (end stage renal disease) on dialysis (HCC) 09/22/2011  . 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 Hyponatremia 09/22/2011    Orientation RESPIRATION BLADDER Height & Weight     Self, Time, Situation, Place  Normal, O2 (2 liters) Continent Weight: 125 lb 10.6 oz (57 kg) Height:  5\' 9"  (175.3 cm)  BEHAVIORAL SYMPTOMS/MOOD NEUROLOGICAL BOWEL NUTRITION STATUS   (none)  (none) Continent Diet (currently npo but to be advanced)  AMBULATORY STATUS COMMUNICATION OF NEEDS Skin    Extensive Assist Verbally Normal                       Personal Care Assistance Level of Assistance  Bathing, Dressing Bathing Assistance: Limited assistance   Dressing Assistance: Limited assistance     Functional Limitations Info   (none)          SPECIAL CARE FACTORS FREQUENCY                       Contractures Contractures Info: Not present    Additional Factors Info  Code Status, Allergies Code Status Info: full Allergies Info: pcn's           Current Medications (11/24/2016):  This is the current hospital active medication list Current Facility-Administered Medications  Medication Dose Route Frequency Provider Last Rate Last Dose  . 0.9 %  sodium chloride infusion  100 mL Intravenous PRN Lateef, Munsoor, MD      . 0.9 %  sodium chloride infusion  100 mL Intravenous PRN Lateef, Munsoor, MD      . acetaminophen (TYLENOL) tablet 650 mg  650 mg Oral Q6H PRN Wieting, Richard, MD       Or  . acetaminophen (TYLENOL) suppository 650 mg  650 mg Rectal Q6H PRN Wieting, Richard, MD      . albuterol (PROVENTIL) (2.5 MG/3ML) 0.083% nebulizer solution 2.5 mg  2.5 mg Nebulization Q4H PRN Wieting, Richard, MD      . alteplase (CATHFLO ACTIVASE) injection 2 mg  2 mg Intracatheter Once PRN Cherylann RatelLateef, Munsoor, MD      . amLODipine (NORVASC) tablet  10 mg  10 mg Oral QHS Alford Highland, MD   10 mg at 11/23/16 2133  . [START ON 11/25/2016] aspirin chewable tablet 81 mg  81 mg Oral Daily Wieting, Richard, MD      . B-complex with vitamin C tablet 1 tablet  1 tablet Oral Daily Alford Highland, MD   Stopped at 11/24/16 1000  . calcium acetate (PHOSLO) capsule 1,334 mg  1,334 mg Oral BID WC Alford Highland, MD   1,334 mg at 11/24/16 9604  . carvedilol (COREG) tablet 25 mg  25 mg Oral BID WC Alford Highland, MD   25 mg at 11/24/16 5409  . ceFEPIme (MAXIPIME) 1 g in dextrose 5 % 50 mL IVPB  1 g Intravenous Q24H Olene Floss, RPH   Stopped at 11/24/16 1000  . Chlorhexidine  Gluconate Cloth 2 % PADS 6 each  6 each Topical Q0600 Alford Highland, MD   6 each at 11/24/16 0600  . docusate sodium (COLACE) capsule 100 mg  100 mg Oral BID Alford Highland, MD   Stopped at 11/24/16 1000  . feeding supplement (NEPRO CARB STEADY) liquid 237 mL  237 mL Oral TID WC Alford Highland, MD   Stopped at 11/24/16 1200  . FLUoxetine (PROZAC) capsule 20 mg  20 mg Oral Daily Alford Highland, MD   Stopped at 11/24/16 1000  . heparin injection 1,000 Units  1,000 Units Dialysis PRN Lateef, Munsoor, MD      . HYDROcodone-acetaminophen (NORCO/VICODIN) 5-325 MG per tablet 1 tablet  1 tablet Oral Q6H PRN Alford Highland, MD   1 tablet at 11/23/16 1724  . insulin aspart (novoLOG) injection 0-5 Units  0-5 Units Subcutaneous QHS Alford Highland, MD   3 Units at 11/23/16 2134  . insulin aspart (novoLOG) injection 0-9 Units  0-9 Units Subcutaneous TID WC Alford Highland, MD   3 Units at 11/23/16 1726  . levothyroxine (SYNTHROID, LEVOTHROID) tablet 275 mcg  275 mcg Oral QAC breakfast Alford Highland, MD   275 mcg at 11/24/16 0827  . lidocaine (PF) (XYLOCAINE) 1 % injection 5 mL  5 mL Intradermal PRN Lateef, Munsoor, MD      . lidocaine-prilocaine (EMLA) cream 1 application  1 application Topical PRN Lateef, Munsoor, MD      . loratadine (CLARITIN) tablet 10 mg  10 mg Oral Daily Alford Highland, MD   Stopped at 11/24/16 1000  . Melatonin TABS 5 mg  5 mg Oral QHS Alford Highland, MD   5 mg at 11/23/16 2133  . mirtazapine (REMERON) tablet 15 mg  15 mg Oral QHS Alford Highland, MD   15 mg at 11/23/16 2134  . mupirocin ointment (BACTROBAN) 2 % 1 application  1 application Nasal BID Alford Highland, MD   Stopped at 11/24/16 1000  . ondansetron (ZOFRAN) tablet 4 mg  4 mg Oral Q6H PRN Alford Highland, MD      . pentafluoroprop-tetrafluoroeth (GEBAUERS) aerosol 1 application  1 application Topical PRN Lateef, Munsoor, MD      . polyethylene glycol (MIRALAX / GLYCOLAX) packet 17 g  17 g Oral Daily  Alford Highland, MD   Stopped at 11/24/16 1000  . pravastatin (PRAVACHOL) tablet 10 mg  10 mg Oral QHS Alford Highland, MD   10 mg at 11/23/16 2133  . promethazine (PHENERGAN) suppository 25 mg  25 mg Rectal Q8H PRN Wieting, Richard, MD      . saccharomyces boulardii (FLORASTOR) capsule 250 mg  250 mg Oral Daily Alford Highland, MD   Stopped at  11/24/16 1000  . traZODone (DESYREL) tablet 100 mg  100 mg Oral QHS Alford Highland, MD   100 mg at 11/23/16 2133  . vancomycin (VANCOCIN) 500 mg in sodium chloride 0.9 % 100 mL IVPB  500 mg Intravenous Q M,W,F-HD Maccia, Melissa D, RPH       Facility-Administered Medications Ordered in Other Encounters  Medication Dose Route Frequency Provider Last Rate Last Dose  . vancomycin (VANCOCIN) IVPB 1000 mg/200 mL premix  1,000 mg Intravenous 60 min Pre-Op Nada Libman, MD         Discharge Medications: Please see discharge summary for a list of discharge medications.  Relevant Imaging Results:  Relevant Lab Results:   Additional Information    York Spaniel, LCSW

## 2016-11-24 NOTE — Transfer of Care (Signed)
Immediate Anesthesia Transfer of Care Note  Patient: Marcus [name]  Procedure(s) Performed: Procedure(s): RAY RESECTION-LEFT 5TH RAY (Left)  Patient Location: PACU  Anesthesia Type:General  Level of Consciousness: sedated and responds to stimulation  Airway & Oxygen Therapy: Patient Spontanous Breathing and Patient connected to face mask oxygen  Post-op Assessment: Report given to RN and Post -op Vital signs reviewed and stable  Post vital signs: Reviewed and stable  Last Vitals:  Vitals:   11/24/16 1405 11/24/16 1958  BP: (!) 93/47 132/65  Pulse: 62 (!) 56  Resp:  15  Temp:    SpO2: 100% 100%    Last Pain:  Vitals:   11/24/16 1330  TempSrc: Axillary  PainSc:       Patients Stated Pain Goal: 1 (11/23/16 1724)  Complications: No apparent anesthesia complications

## 2016-11-24 NOTE — H&P (View-Only) (Signed)
Reason for Consult: Osteomyelitis left foot Referring Physician: Wieting  Marcus [name] is an 61 y.o. male.  HPI: This is a 61-year-old male with a history of a chronic sore on the outside of his left foot for at least the past couple of months. States he has been in a nursing home where this is been cared for. Has been wearing some pressure relief boots. Does not relate any history of injury. Denies any pain with this. Evaluated earlier today in the emergency department where x-ray showed osteomyelitis and he was admitted for definitive debridement.  Past Medical History:  Diagnosis Date  . Cataract   . CHF (congestive heart failure) (HCC)   . Chronic pain   . Cocaine abuse    past history  . COPD (chronic obstructive pulmonary disease) (HCC)   . CVA (cerebrovascular accident) (HCC)    hx CVA x 2  . Depression   . Diabetes mellitus (HCC)    Sees Dr. Ajay Kumar [phone] -- fasting blood 70-110s  . DKA (diabetic ketoacidosis) (HCC)    severe, Dec 2011  . DVT (deep venous thrombosis) (HCC)   . ESRD (end stage renal disease) on dialysis (HCC)    hd - [facility] [place]  . GERD (gastroesophageal reflux disease)   . GI bleed    2006, mallory-weiss tear  . Goiter    by CT Apr 2013  . Hemodialysis patient (HCC)    M, W, F  . History of blood transfusion   . HTN (hypertension)    sees Dr. Haque, [place]  . Hyperlipidemia   . Hypothyroidism   . Insomnia   . Neuromuscular disorder (HCC)    diabetic neuropathy  . Occlusion of carotid stent (HCC)    Per patients nursing aid  . On home oxygen therapy    2l nasal cannula at night  . Pneumonia    hx of  . Pulmonary edema    nov 2011, dry wt decreased  . Tobacco abuse    history of    Past Surgical History:  Procedure Laterality Date  . ARTERIOVENOUS GRAFT PLACEMENT Left    thigh  . AV FISTULA PLACEMENT  03/25/2008   right forearm  . AV FISTULA PLACEMENT  04/23/2012   Procedure: INSERTION OF ARTERIOVENOUS (AV) GORE-TEX  GRAFT THIGH;  Surgeon: Nicola E Fields, MD;  Location: [facility] OR;  Service: Vascular;  Laterality: Left;  . AV FISTULA PLACEMENT Left 03/14/2013   Procedure: INSERTION OF  ARTERIOVENOUS (AV) GORE-TEX GRAFT  LEFT THIGH;  Surgeon:  F Early, MD;  Location: [facility] OR;  Service: Vascular;  Laterality: Left;  . AV FISTULA PLACEMENT Right 12/30/2013   Procedure: INSERTION OF ARTERIOVENOUS (AV) GORE-TEX GRAFT THIGH;  Surgeon: Brian L Chen, MD;  Location: [facility] OR;  Service: Vascular;  Laterality: Right;  . AVGG REMOVAL Left 04/16/2013   Procedure: REMOVAL OF ARTERIOVENOUS GORETEX GRAFT Left Thigh;  Surgeon: Vance W Brabham, MD;  Location: [facility] OR;  Service: Vascular;  Laterality: Left;  . CAROTID STENT INSERTION Right   . COLONOSCOPY  09/16/13  . dyalisis catheter    . EYE SURGERY Bilateral    cataracts  . INSERTION OF DIALYSIS CATHETER Right 03/03/2013   Procedure: INSERTION OF DIALYSIS CATHETER;  Surgeon: Brian L Chen, MD;  Location: [facility];  Service: Vascular;  Laterality: Right;  . INSERTION OF DIALYSIS CATHETER Left 09/18/2013   Procedure: INSERTION OF DIALYSIS CATHETER-LEFT NECK VS LEFT THIGH;  Surgeon: James D Lawson, MD;  Location: [facility];    Service: Vascular;  Laterality: Left;  . LIGATION ARTERIOVENOUS GORTEX GRAFT Right 02/27/2013   Procedure: LIGATION ARTERIOVENOUS GORTEX GRAFT;  Surgeon: Christopher S Dickson, MD;  Location: [facility];  Service: Vascular;  Laterality: Right;  . LIGATION ARTERIOVENOUS GORTEX GRAFT Left 03/03/2013   Procedure: LIGATION Left ARTERIOVENOUS GORTEX Thigh GRAFT;  Surgeon: Brian L Chen, MD;  Location: [facility];  Service: Vascular;  Laterality: Left;  . LYMPH NODE BIOPSY Right 01/28/2013   Procedure: LYMPH NODE BIOPSY;  Surgeon:  M Gerkin, MD;  Location: [facility];  Service: General;  Laterality: Right;  . MASS BIOPSY N/A 01/28/2013   Procedure: NECK EXPLORATION;  Surgeon:  M Gerkin, MD;  Location: [facility];  Service: General;  Laterality: N/A;  . PATCH ANGIOPLASTY Left 04/16/2013    Procedure: Patch Angioplasty of left femoral artery;  Surgeon: Vance W Brabham, MD;  Location: [facility];  Service: Vascular;  Laterality: Left;  . REMOVAL OF A DIALYSIS CATHETER Right 09/18/2013   Procedure: REMOVAL OF A DIALYSIS CATHETER-RIGHT FEMORAL VEIN;  Surgeon: James D Lawson, MD;  Location: [facility];  Service: Vascular;  Laterality: Right;  . SHUNTOGRAM Right 03/28/2012   Procedure: SHUNTOGRAM;  Surgeon: Brian L Chen, MD;  Location: [facility] CATH LAB;  Service: Cardiovascular;  Laterality: Right;  . THYROID SURGERY  01/28/2013   Dr Gerkin    Family History  Problem Relation Age of Onset  . Diabetes Sister   . Diabetes Brother   . Hypertension Brother   . Heart disease Brother   . Hypertension Mother   . Heart attack Mother   . Hyperlipidemia Mother   . Heart disease Mother        before age 60  . Hypertension Father     Social History:  reports that he has been smoking Cigarettes.  He has been smoking about 0.25 packs per day. He has never used smokeless tobacco. He reports that he does not drink alcohol or use drugs.  Allergies:  Allergies  Allergen Reactions  . Penicillins Other (See Comments)    Unknown reaction    Medications:  Scheduled: . amLODipine  10 mg Oral QHS  . [START ON 11/25/2016] aspirin  81 mg Oral Daily  . [START ON 11/24/2016] B-complex with vitamin C  1 tablet Oral Daily  . calcium acetate  1,334 mg Oral BID WC  . carvedilol  25 mg Oral BID WC  . docusate sodium  100 mg Oral BID  . feeding supplement (NEPRO CARB STEADY)  237 mL Oral TID WC  . [START ON 11/24/2016] FLUoxetine  20 mg Oral Daily  . insulin aspart  0-5 Units Subcutaneous QHS  . insulin aspart  0-9 Units Subcutaneous TID WC  . insulin glargine  4 Units Subcutaneous QHS  . [START ON 11/24/2016] levothyroxine  275 mcg Oral QAC breakfast  . [START ON 11/24/2016] loratadine  10 mg Oral Daily  . Melatonin  5 mg Oral QHS  . mirtazapine  15 mg Oral QHS  . polyethylene glycol  17 g Oral Daily  .  pravastatin  10 mg Oral QHS  . saccharomyces boulardii  250 mg Oral Daily  . traZODone  100 mg Oral QHS    Results for orders placed or performed during the hospital encounter of 11/23/16 (from the past 48 hour(s))  Comprehensive metabolic panel     Status: Abnormal   Collection Time: 11/23/16 10:44 AM  Result Value Ref Range   Sodium 136 135 - 145 mmol/L   Potassium 3.9   3.5 - 5.1 mmol/L   Chloride 98 (L) 101 - 111 mmol/L   CO2 29 22 - 32 mmol/L   Glucose, Bld 214 (H) 65 - 99 mg/dL   BUN 13 6 - 20 mg/dL   Creatinine, Ser 3.09 (H) 0.61 - 1.24 mg/dL   Calcium 8.0 (L) 8.9 - 10.3 mg/dL   Total Protein 7.4 6.5 - 8.1 g/dL   Albumin 2.8 (L) 3.5 - 5.0 g/dL   AST 172 (H) 15 - 41 U/L   ALT 41 17 - 63 U/L   Alkaline Phosphatase 138 (H) 38 - 126 U/L   Total Bilirubin 0.6 0.3 - 1.2 mg/dL   GFR calc non Af Amer 20 (L) >60 mL/min   GFR calc Af Amer 23 (L) >60 mL/min    Comment: (NOTE) The eGFR has been calculated using the CKD EPI equation. This calculation has not been validated in all clinical situations. eGFR's persistently <60 mL/min signify possible Chronic Kidney Disease.    Anion gap 9 5 - 15  CBC with Differential     Status: Abnormal   Collection Time: 11/23/16 10:44 AM  Result Value Ref Range   WBC 3.5 (L) 3.8 - 10.6 K/uL   RBC 3.98 (L) 4.40 - 5.90 MIL/uL   Hemoglobin 11.8 (L) 13.0 - 18.0 g/dL   HCT 36.4 (L) 40.0 - 52.0 %   MCV 91.4 80.0 - 100.0 fL   MCH 29.6 26.0 - 34.0 pg   MCHC 32.4 32.0 - 36.0 g/dL   RDW 16.8 (H) 11.5 - 14.5 %   Platelets 185 150 - 440 K/uL   Neutrophils Relative % 64 %   Neutro Abs 2.3 1.4 - 6.5 K/uL   Lymphocytes Relative 18 %   Lymphs Abs 0.6 (L) 1.0 - 3.6 K/uL   Monocytes Relative 11 %   Monocytes Absolute 0.4 0.2 - 1.0 K/uL   Eosinophils Relative 5 %   Eosinophils Absolute 0.2 0 - 0.7 K/uL   Basophils Relative 2 %   Basophils Absolute 0.1 0 - 0.1 K/uL  Glucose, capillary     Status: Abnormal   Collection Time: 11/23/16  4:53 PM  Result  Value Ref Range   Glucose-Capillary 225 (H) 65 - 99 mg/dL    Dg Foot 2 Views Left  Result Date: 11/23/2016 CLINICAL DATA:  Fifth toe infection. EXAM: LEFT FOOT - 2 VIEW COMPARISON:  Left foot x-rays dated Sep 05, 2012. FINDINGS: There is extensive bony destruction of the fifth metatarsal and proximal phalanx with surrounding soft tissue gas. Permeative appearance of the proximal fifth metatarsal. No additional sites of cortical destruction. No acute fracture. Hyperflexion at the first IP joint. Osteopenia. IMPRESSION: 1. Extensive bony destruction of the fifth metatarsal and proximal phalanx, consistent with osteomyelitis. Electronically Signed   By: Titus Dubin M.D.   On: 11/23/2016 11:45    Review of Systems  Constitutional: Negative for chills and fever.  HENT: Negative.   Eyes: Negative.   Respiratory: Negative.   Cardiovascular: Negative.   Gastrointestinal: Negative for nausea and vomiting.  Genitourinary: Negative.   Musculoskeletal: Negative.   Skin:       Chronic draining wound on his left foot  Neurological:       Patient does relate some numbness in the feet related to his diabetes.  Endo/Heme/Allergies: Negative.   Psychiatric/Behavioral: Negative.    Blood pressure 114/60, pulse 66, temperature 97.6 F (36.4 C), temperature source Oral, resp. rate 17, height 5' 9" (1.753 m), weight 56.9 kg (  125 lb 6.4 oz), SpO2 100 %. Physical Exam  Cardiovascular:  DP pulse is 2 over 4 bilateral. PT pulse is trace bilateral.  Musculoskeletal:  Stiff range of motion in the pedal joints. Muscle atrophy in the lower extremities with some mild contracture. Muscle testing is deferred.  Neurological:  Complete loss of protective threshold with a monofilament wire from the lower leg distal to the toes. Proprioception is completely impaired.  Skin:  The skin is then dry and atrophic with absent hair growth. Full-thickness ulceration with necrosis down to the level of bone on the lateral  aspect of the left fifth metatarsal. Some significant erythema and edema laterally.    Assessment/Plan: Assessment: 1. Osteomyelitis left fifth metatarsal. 2. Diabetes with associated neuropathy.  Plan: Dressing reapplied to the left foot. Discussed with the patient the need for debridement of the infected bone with amputation of the left foot and a ray resection. Discussed possible risks and complications of the procedure including inability to heal due to his diabetes or peripheral vascular disease as well as continued infection. Discussed that he would be at significant risk for limb loss. Questions invited. Obtain consent for debridement of infected bone with fifth ray resection left foot. Nothing by mouth after early breakfast tomorrow. Plan for surgery tomorrow late afternoon or evening  Durward Fortes 11/23/2016, 6:06 PM

## 2016-11-24 NOTE — Clinical Social Work Note (Signed)
Clinical Social Work Assessment  Patient Details  Name: Marcus MuttersCharles O [name] MRN: [id number] Date of Birth: May 20, 1955  Date of referral:  11/24/16               Reason for consult:  Facility Placement                Permission sought to share information with:    Permission granted to share information::     Name::        Agency::     Relationship::     Contact Information:     Housing/Transportation Living arrangements for the past 2 months:  Skilled Building [facility]ursing Facility Source of Information:  Facility Patient Interpreter Needed:  None Criminal Activity/Legal Involvement Pertinent to Current Situation/Hospitalization:  No - Comment as needed Significant Relationships:  None Lives with:  Facility Resident Do you feel safe going back to the place where you live?    Need for family participation in patient care:     Care giving concerns:  None   Social Worker assessment / plan:  Patient admitted to the hospital from [facility]. CSW contacted their Admission's Coordinator, Eunice BlaseDebbie, at [phone], and Eunice BlaseDebbie stated that patient is a long term resident at their facility and that they will be able to take patient back at discharge. CSW to follow up with patient as patient is currently off the unit for hemodialysis.   Employment status:  Disabled (Comment on whether or not currently receiving Disability) Insurance information:  Medicare PT Recommendations:    Information / Referral to community resources:     Patient/Family's Response to care:  N/A at this time.  Patient/Family's Understanding of and Emotional Response to Diagnosis, Current Treatment, and Prognosis:  N/A at this time.  Emotional Assessment Appearance:    Attitude/Demeanor/Rapport:    Affect (typically observed):    Orientation:  Oriented to Self, Oriented to Place, Oriented to  Time, Oriented to Situation Alcohol / Substance use:  Not Applicable Psych involvement (Current and /or in the community):  No  (Comment)  Discharge Needs  Concerns to be addressed:  Care Coordination Readmission within the last 30 days:  No Current discharge risk:  None Barriers to Discharge:  No Barriers Identified   York SpanielMonica Matilda Fleig, LCSW 11/24/2016, 11:30 AM

## 2016-11-24 NOTE — Progress Notes (Signed)
Pre hd info 

## 2016-11-24 NOTE — Progress Notes (Signed)
11/24/2016  Hypoglycemic Event  CBG: 39  Treatment: D50 IV 25 mL  Symptoms: None  Follow-up CBG: Time:0930 CBG Result:132  Possible Reasons for Event: Inadequate meal intake  Comments/MD notified:Pt was asymptomatic, treatment was effective.  Will discuss when MD rounds.      Bradly Chrisougherty, Neeley Sedivy E

## 2016-11-24 NOTE — Progress Notes (Signed)
POST DIALYSIS ASSESSMENT 

## 2016-11-24 NOTE — Op Note (Signed)
Date of operation: 11/24/2016.  Surgeon: [name] W Ricca Melgarejo DPM.  Preoperative diagnosis: Osteomyelitis left fifth metatarsal.  Postoperative diagnosis: Same.  Procedure: Amputation left fifth toe with partial ray resection.  Anesthesia: LMA.  Hemostasis: Pneumatic tourniquet left ankle 250 mmHg.  Estimated blood loss: Less than 5 cc.  Pathology: Left fifth toe and metatarsal.  Cultures: Bone cultures left fifth metatarsal.  Implants: Stimulan rapid cure antibiotic beads impregnated with vancomycin.  Injectables: 10 cc 0.5% bupivacaine plain.  Complications: None apparent.  Operative indications: This is a 61 year old male with chronic ulceration on his left foot recently admitted for osteomyelitis. Decision made for debridement with ray resection on his left foot.  Operative procedure: Patient was taken to the operating room and placed on the table in the supine position. Following satisfactory LMA anesthesia a pneumatic tourniquet was applied at the level of the left ankle and foot was prepped and draped in usual sterile fashion. The foot was exsanguinated and the tourniquet inflated to 250 mmHg.    Attention was directed towards the lateral aspect of the left foot where a large necrotic ulceration was noted over the fifth metatarsal head area. An elliptical incision was made coursing medial to lateral around the base of the fifth toe and then the incision was communicated with the open ulceration. The fifth toe was then dissected from the normal surrounding anatomy and removed in total. Significant necrotic tissue was noted in the area. Next an approximate 5 cm incision was made proximally from the ulceration along the fifth metatarsal shaft. This was carried down directly to bone and the fifth metatarsal was freed from the surrounding normal anatomy with care taken to leave the articulation with the fourth metatarsal and cuboid intact. The bone was incised at the proximal aspect using a  sagittal saw and the remaining distal portion of the fifth metatarsal was removed in toto. Significant fragmentation of the head area. A sample of bone was taken for culture from the shaft. Surrounding soft tissues were debrided sharply using a ronguer down to the level of the previous bone and an excisional manner. All of the infected wound edges were then debrided with a versa jet debrider on a setting of 5 and then the wound was thoroughly irrigated with the versa jet on a setting of 2. The proximal portion of the incision was closed using 3-0 nylon simple interrupted sutures and then the wound was flushed with copious amounts of sterile saline and Stimulan rapid cure antibiotic beads placed into the remaining base of the fifth metatarsal bone as well as into the wound. The toe incision was closed using 3-0 nylon simple interrupted sutures and the remainder of the beads placed in the wound and then the ulcerative area was reapproximated using 3-0 nylon simple interrupted sutures. 10 cc of 0.5% bupivacaine plain was then injected for postoperative analgesia followed by Xeroform 4 x 4's ABDs Kerlix and Ace wrap. Tourniquet was released and blood flow noted to return to medially to the left foot. Patient tolerated the procedure and anesthesia well and was transported to the PACU with vital signs stable and in good condition.

## 2016-11-24 NOTE — Progress Notes (Signed)
[facility]  ROUNDING NOTE   Subjective:  Patient seen and evaluated during hemodialysis. Serum phosphorous noted to be low. Orders have been given to replace phosphorous postdialysis so as to avoid removal of phosphorus during dialysis.   Objective:  Vital signs in last 24 hours:  Temp:  [97.3 F (36.3 C)-98.4 F (36.9 C)] 98 F (36.7 C) (08/10 0950) Pulse Rate:  [60-66] 65 (08/10 1300) Resp:  [10-17] 14 (08/10 1300) BP: (100-125)/(50-95) 118/95 (08/10 1300) SpO2:  [100 %] 100 % (08/10 1200) Weight:  [56.9 kg (125 lb 6.4 oz)-57 kg (125 lb 10.6 oz)] 57 kg (125 lb 10.6 oz) (08/10 0950)  Weight change:  Filed Weights   11/23/16 1030 11/23/16 1721 11/24/16 0950  Weight: 59.4 kg (131 lb) 56.9 kg (125 lb 6.4 oz) 57 kg (125 lb 10.6 oz)    Intake/Output: I/O last 3 completed shifts: In: 305 [IV Piggyback:305] Out: -    Intake/Output this shift:  No intake/output data recorded.  Physical Exam: General: Chronically ill appearing  Head: Normocephalic, atraumatic. Moist oral mucosal membranes  Eyes: Anicteric  Neck: Supple, trachea midline  Lungs:  Clear to auscultation, normal effort  Heart: S1S2 no rubs  Abdomen:  Soft, nontender, bowel sounds present  Extremities: trace peripheral edema, both feet in heel protectors  Neurologic: Awake, alert, following commands  Skin: No lesions  Access: Left femoral permcath    Basic Metabolic Panel:  Recent Labs Lab 11/23/16 1044 11/24/16 0500 11/24/16 1007  NA 136 138  --   K 3.9 3.3*  --   CL 98* 100*  --   CO2 29 29  --   GLUCOSE 214* 71  --   BUN 13 19  --   CREATININE 3.09* 3.50*  --   CALCIUM 8.0* 8.1*  --   PHOS  --   --  <1.0*    Liver Function Tests:  Recent Labs Lab 11/23/16 1044  AST 172*  ALT 41  ALKPHOS 138*  BILITOT 0.6  PROT 7.4  ALBUMIN 2.8*   No results for input(s): LIPASE, AMYLASE in the last 168 hours. No results for input(s): AMMONIA in the last 168 hours.  CBC:  Recent  Labs Lab 11/23/16 1044 11/24/16 0500  WBC 3.5* 4.0  NEUTROABS 2.3  --   HGB 11.8* 11.3*  HCT 36.4* 34.5*  MCV 91.4 90.9  PLT 185 180    Cardiac Enzymes: No results for input(s): CKTOTAL, CKMB, CKMBINDEX, TROPONINI in the last 168 hours.  BNP: Invalid input(s): POCBNP  CBG:  Recent Labs Lab 11/23/16 1653 11/23/16 2120 11/24/16 0751 11/24/16 0831 11/24/16 0938  GLUCAP 225* 258* 47* 38* 132*    Microbiology: Results for orders placed or performed during the hospital encounter of 11/23/16  MRSA PCR Screening     Status: Abnormal   Collection Time: 11/23/16  6:37 PM  Result Value Ref Range Status   MRSA by PCR POSITIVE (A) NEGATIVE Final    Comment:        The GeneXpert MRSA Assay (FDA approved for NASAL specimens only), is one component of a comprehensive MRSA colonization surveillance program. It is not intended to diagnose MRSA infection nor to guide or monitor treatment for MRSA infections. RESULT CALLED TO, READ BACK BY AND VERIFIED WITH: STACEY CLAY 11/23/16 @ 2054  MLK     Coagulation Studies: No results for input(s): LABPROT, INR in the last 72 hours.  Urinalysis: No results for input(s): COLORURINE, LABSPEC, PHURINE, GLUCOSEU, HGBUR, BILIRUBINUR, KETONESUR, PROTEINUR,  UROBILINOGEN, NITRITE, LEUKOCYTESUR in the last 72 hours.  Invalid input(s): APPERANCEUR    Imaging: Dg Foot 2 Views Left  Result Date: 11/23/2016 CLINICAL DATA:  Fifth toe infection. EXAM: LEFT FOOT - 2 VIEW COMPARISON:  Left foot x-rays dated Sep 05, 2012. FINDINGS: There is extensive bony destruction of the fifth metatarsal and proximal phalanx with surrounding soft tissue gas. Permeative appearance of the proximal fifth metatarsal. No additional sites of cortical destruction. No acute fracture. Hyperflexion at the first IP joint. Osteopenia. IMPRESSION: 1. Extensive bony destruction of the fifth metatarsal and proximal phalanx, consistent with osteomyelitis. Electronically Signed   By:  Obie DredgeWilliam T Derry M.D.   On: 11/23/2016 11:45     Medications:   . sodium chloride    . sodium chloride    . ceFEPime (MAXIPIME) IV Stopped (11/24/16 1000)  . sodium phosphate  Dextrose 5% IVPB    . vancomycin 500 mg (11/24/16 1227)   . amLODipine  10 mg Oral QHS  . [START ON 11/25/2016] aspirin  81 mg Oral Daily  . B-complex with vitamin C  1 tablet Oral Daily  . calcium acetate  1,334 mg Oral BID WC  . carvedilol  25 mg Oral BID WC  . Chlorhexidine Gluconate Cloth  6 each Topical Q0600  . docusate sodium  100 mg Oral BID  . feeding supplement (NEPRO CARB STEADY)  237 mL Oral TID WC  . FLUoxetine  20 mg Oral Daily  . insulin aspart  0-5 Units Subcutaneous QHS  . insulin aspart  0-9 Units Subcutaneous TID WC  . levothyroxine  275 mcg Oral QAC breakfast  . loratadine  10 mg Oral Daily  . Melatonin  5 mg Oral QHS  . mirtazapine  15 mg Oral QHS  . mupirocin ointment  1 application Nasal BID  . polyethylene glycol  17 g Oral Daily  . pravastatin  10 mg Oral QHS  . saccharomyces boulardii  250 mg Oral Daily  . traZODone  100 mg Oral QHS   sodium chloride, sodium chloride, acetaminophen **OR** acetaminophen, albuterol, alteplase, heparin, HYDROcodone-acetaminophen, lidocaine (PF), lidocaine-prilocaine, ondansetron, pentafluoroprop-tetrafluoroeth, promethazine  Assessment/ Plan:  61 y.o. male with a PMHx of ESRD on HD MWF, diabetes mellitus type 1, COPD, tobacco abuse, hypertension, anemia of chronic kidney disease, secondary hyperparathyroidism, history of CVA, prior episodes of DKA, who [facility] for evaluation of left fifth toe infection.   MWF/[place]/L femoral permcath  1.  ESRD on HD  MWF.  patient seen evaluated during hemodialysis.  We plan to complete dialysis today.  2. anemia chronic kidney disease.  continue to hold Epogen at this time.  3.  Secondary hyperparathyroidism. Phosphorus noted to be quite low at less than 1.0.  Discontinue PhosLo.   Administer sodium phosphorus 20 mmol IV x1.  4.  Hypertension.   Continue amlodipine and carvedilol.  5.  Left foot osteomyelitis. Continue the patient on cefepime and vancomycin.   LOS: 1 Toccara Alford 8/10/20181:26 PM

## 2016-11-24 NOTE — Progress Notes (Signed)
Inpatient Diabetes Program Recommendations  AACE/ADA: New Consensus Statement on Inpatient Glycemic Control (2015)  Target Ranges:  Prepandial:   less than 140 mg/dL      Peak postprandial:   less than 180 mg/dL (1-2 hours)      Critically ill patients:  140 - 180 mg/dL   Lab Results  Component Value Date   GLUCAP 107 (H) 11/24/2016   HGBA1C 8.6 (H) 03/27/2014   Fructosamine 657 on 10/23/16 (normal 200-285 mcmol/L)    Review of Glycemic Control  Diabetes history: Type 1 since age 61- [facility] seen on 10/23/16  Outpatient Diabetes medications: Reports Lantus 5 units qhs, Novolog 1-5 units tid with meals (based on pre-meal blood sugar and amount to be eaten)  Current orders for Inpatient glycemic control: Novolog 0-9 units tid  Inpatient Diabetes Program Recommendations: Please consider ordering Lantus 3 units qhs and Novolog custom mealtime insulin 0-5 units, with a note that states "patient will determine amount of mealtime Novolog insulin based on pre-meal blood sugar and amount to be eaten"  Patient has Type 1 diabetes and makes NO insulin- with no insulin administered today- I am fearful of putting the patient in DKA.  Discussed with RN Ree KidaJack and paged Dr. Migdalia DkShah  Nealy Hickmon, RN, BA, MHA, CDE Diabetes Coordinator Inpatient Diabetes Program  [phone] (Team Pager) [phone] [facility] Office) 11/24/2016 3:11 PM

## 2016-11-24 NOTE — Progress Notes (Signed)
HD COMPLETED  

## 2016-11-24 NOTE — Anesthesia Postprocedure Evaluation (Signed)
Anesthesia Post Note  Patient: Vicie MuttersCharles O Verbrugge  Procedure(s) Performed: Procedure(s) (LRB): RAY RESECTION-LEFT 5TH RAY (Left)  Patient location during evaluation: PACU Anesthesia Type: General Level of consciousness: sedated Pain management: pain level controlled Vital Signs Assessment: post-procedure vital signs reviewed and stable Respiratory status: spontaneous breathing and respiratory function stable Cardiovascular status: stable Anesthetic complications: no     Last Vitals:  Vitals:   11/24/16 1958 11/24/16 2012  BP: 132/65 108/64  Pulse: (!) 56 (!) 56  Resp: 15 10  Temp:    SpO2: 100% 100%    Last Pain:  Vitals:   11/24/16 2012  TempSrc:   PainSc: Asleep                 Homer Miller K

## 2016-11-24 NOTE — Progress Notes (Signed)
Pt seen chart reviewed and case discussed with Dr Alberteen Spindleline  There is concern that his foot is not salvageable and consent has been an issue  I would recommend angiogram to both improve perfusion for healing and better plan amputation if needed.    Timing for angiogram would be for this Tuesday so that appropriate consent can be obtained.  Full consult to follow

## 2016-11-24 NOTE — Progress Notes (Signed)
[employer] [facility] [place] [facility]   PATIENT NAME: Marcus JacksonCharles Dutan    MR#:  [id number]  DATE OF BIRTH:  06/17/1955  SUBJECTIVE:  CHIEF COMPLAINT:   Chief Complaint  Patient presents with  . Wound Infection  Seen at dialysis, no complaints, hungry, hypotensive REVIEW OF SYSTEMS:  Review of Systems  Constitutional: Negative for chills, fever and weight loss.  HENT: Negative for nosebleeds and sore throat.   Eyes: Negative for blurred vision.  Respiratory: Negative for cough, shortness of breath and wheezing.   Cardiovascular: Negative for chest pain, orthopnea, leg swelling and PND.  Gastrointestinal: Negative for abdominal pain, constipation, diarrhea, heartburn, nausea and vomiting.  Genitourinary: Negative for dysuria and urgency.  Musculoskeletal: Positive for joint pain. Negative for back pain.  Skin: Negative for rash.  Neurological: Negative for dizziness, speech change, focal weakness and headaches.  Endo/Heme/Allergies: Does not bruise/bleed easily.  Psychiatric/Behavioral: Negative for depression.    DRUG ALLERGIES:   Allergies  Allergen Reactions  . Penicillins Other (See Comments)    Unknown reaction   VITALS:  Blood pressure (!) 93/47, pulse 62, temperature 97.6 F (36.4 C), temperature source Axillary, resp. rate (!) 9, height 5\' 9"  (1.753 m), weight 57 kg (125 lb 10.6 oz), SpO2 100 %. PHYSICAL EXAMINATION:  Physical Exam  Constitutional: He is oriented to person, place, and time. He appears malnourished. He appears unhealthy. He has a sickly appearance.  HENT:  Head: Normocephalic and atraumatic.  Eyes: Pupils are equal, round, and reactive to light. Conjunctivae and EOM are normal.  Neck: Normal range of motion. Neck supple. No tracheal deviation present. No thyromegaly present.  Cardiovascular: Normal rate, regular rhythm and normal heart sounds.   Pulmonary/Chest: Effort normal and breath sounds normal. No respiratory distress. He has  no wheezes. He exhibits no tenderness.  Abdominal: Soft. Bowel sounds are normal. He exhibits no distension. There is no tenderness.  Musculoskeletal: Normal range of motion.  Neurological: He is alert and oriented to person, place, and time. No cranial nerve deficit.  Skin: Skin is warm and dry. Rash noted.  Open lesion left lateral foot along the fifth metatarsal and MT joint.  Dressing in place.  Psychiatric: Mood and affect normal.   LABORATORY PANEL:  Male CBC  Recent Labs Lab 11/24/16 0500  WBC 4.0  HGB 11.3*  HCT 34.5*  PLT 180   ------------------------------------------------------------------------------------------------------------------ Chemistries   Recent Labs Lab 11/23/16 1044 11/24/16 0500  NA 136 138  K 3.9 3.3*  CL 98* 100*  CO2 29 29  GLUCOSE 214* 71  BUN 13 19  CREATININE 3.09* 3.50*  CALCIUM 8.0* 8.1*  AST 172*  --   ALT 41  --   ALKPHOS 138*  --   BILITOT 0.6  --    RADIOLOGY:  No results found. ASSESSMENT AND PLAN:   1. osteomyelitis of the left fifth toe - Continue vancomycin and cefepime - Appreciate vascular surgery and podiatry input - Plan for debridement later today and possible angiogram next Tuesday  2. End-stage renal disease on dialysis Monday Wednesday Friday - Just had dialysis earlier today  3. Essential hypertension on Norvasc and Coreg  4.  Hypoglycemia with history of diabetes: Hold Lantus and NovoLog, cover with sliding scale.  This is likely due to poor by mouth intake/nothing by mouth  5. Hypothyroidism unspecified on Synthroid  6.  Hyperlipidemia, on statin  7.  Hypokalemia: Should get corrected with dialysis  8. Tobacco abuse. Smoking cessation counseling done  4 minutes by admitting Dr.     All the records are reviewed and case discussed with Care Management/Social Worker. Management plans discussed with the patient, nursing and they are in agreement.  CODE STATUS: Full Code  TOTAL TIME TAKING CARE OF  THIS PATIENT: 25 minutes.   More than 50% of the time was spent in counseling/coordination of care: YES  POSSIBLE D/C early next week, DEPENDING ON CLINICAL CONDITION.  And podiatry and vascular surgery evaluation   Delfino Lovett M.D on 11/24/2016 at 2:33 PM  Between 7am to 6pm - Pager - [phone]  After 6pm go to [email or web address] - Social research officer, government  [facility] [place] [facility]  Office  [phone]  CC: Primary care physician; Lindwood Qua, MD  Note: This dictation was prepared with Dragon dictation along with smaller phrase technology. Any transcriptional errors that result from this process are unintentional.

## 2016-11-24 NOTE — Anesthesia Post-op Follow-up Note (Signed)
Anesthesia QCDR form completed.        

## 2016-11-24 NOTE — Progress Notes (Signed)
Hd start 

## 2016-11-25 LAB — GLUCOSE, CAPILLARY
GLUCOSE-CAPILLARY: 99 mg/dL (ref 65–99)
GLUCOSE-CAPILLARY: 334 mg/dL — AB (ref 65–99)
GLUCOSE-CAPILLARY: 350 mg/dL — AB (ref 65–99)
Glucose-Capillary: 186 mg/dL — ABNORMAL HIGH (ref 65–99)
Glucose-Capillary: 87 mg/dL (ref 65–99)

## 2016-11-25 LAB — HEPATITIS B SURFACE ANTIGEN: Hepatitis B Surface Ag: NEGATIVE

## 2016-11-25 LAB — BASIC METABOLIC PANEL
ANION GAP: 9 (ref 5–15)
BUN: 10 mg/dL (ref 6–20)
CALCIUM: 7.8 mg/dL — AB (ref 8.9–10.3)
CO2: 30 mmol/L (ref 22–32)
Chloride: 99 mmol/L — ABNORMAL LOW (ref 101–111)
Creatinine, Ser: 2.58 mg/dL — ABNORMAL HIGH (ref 0.61–1.24)
GFR, EST AFRICAN AMERICAN: 29 mL/min — AB (ref >60)
GFR, EST NON AFRICAN AMERICAN: 25 mL/min — AB (ref >60)
Glucose, Bld: 141 mg/dL — ABNORMAL HIGH (ref 65–99)
Potassium: 3.5 mmol/L (ref 3.5–5.1)
SODIUM: 138 mmol/L (ref 135–145)

## 2016-11-25 LAB — HEPATITIS B SURFACE ANTIBODY,QUALITATIVE: HEP B S AB: REACTIVE

## 2016-11-25 LAB — CBC
HEMATOCRIT: 36.4 % — AB (ref 40.0–52.0)
Hemoglobin: 11.7 g/dL — ABNORMAL LOW (ref 13.0–18.0)
MCH: 29 pg (ref 26.0–34.0)
MCHC: 32.1 g/dL (ref 32.0–36.0)
MCV: 90.4 fL (ref 80.0–100.0)
Platelets: 192 10*3/uL (ref 150–440)
RBC: 4.02 MIL/uL — ABNORMAL LOW (ref 4.40–5.90)
RDW: 16.9 % — AB (ref 11.5–14.5)
WBC: 5.6 10*3/uL (ref 3.8–10.6)

## 2016-11-25 LAB — PARATHYROID HORMONE, INTACT (NO CA): PTH: 107 pg/mL — ABNORMAL HIGH (ref 15–65)

## 2016-11-25 MED ORDER — INSULIN ASPART 100 UNIT/ML ~~LOC~~ SOLN: 0.0000 [IU] | Freq: Three times a day (TID) | SUBCUTANEOUS | Status: DC

## 2016-11-25 MED ORDER — INSULIN GLARGINE 100 UNIT/ML ~~LOC~~ SOLN
15.0000 [IU] | Freq: Every day | SUBCUTANEOUS | Status: DC
  Administered 2016-11-26: 15 [IU] via SUBCUTANEOUS
  Filled 2016-11-25 (×2): qty 0.15

## 2016-11-25 MED ORDER — INSULIN ASPART 100 UNIT/ML ~~LOC~~ SOLN
0.0000 [IU] | Freq: Three times a day (TID) | SUBCUTANEOUS | Status: DC
  Administered 2016-11-25: 2 [IU] via SUBCUTANEOUS
  Administered 2016-11-25: 7 [IU] via SUBCUTANEOUS
  Administered 2016-11-26: 5 [IU] via SUBCUTANEOUS
  Administered 2016-11-26: 3 [IU] via SUBCUTANEOUS
  Administered 2016-11-28: 5 [IU] via SUBCUTANEOUS
  Filled 2016-11-25 (×5): qty 1

## 2016-11-25 NOTE — Plan of Care (Signed)
Problem: Education: Goal: Knowledge of [facility] Education information/materials will improve Outcome: Not Progressing Patient needs assistance.   Problem: Health Behavior/Discharge Planning: Goal: Ability to manage health-related needs will improve Outcome: Not Progressing Patient needs assistance.   

## 2016-11-25 NOTE — Progress Notes (Signed)
[employer] - [place] at [facility]   PATIENT NAME: Marcus [name]    MR#:  [id number]  DATE OF BIRTH:  1955/08/23  SUBJECTIVE:  CHIEF COMPLAINT:   Chief Complaint  Patient presents with  . Wound Infection   Patient currently has no complaints of pain in the leg improved   REVIEW OF SYSTEMS:  Review of Systems  Constitutional: Negative for chills, fever and weight loss.  HENT: Negative for nosebleeds and sore throat.   Eyes: Negative for blurred vision.  Respiratory: Negative for cough, shortness of breath and wheezing.   Cardiovascular: Negative for chest pain, orthopnea, leg swelling and PND.  Gastrointestinal: Negative for abdominal pain, constipation, diarrhea, heartburn, nausea and vomiting.  Genitourinary: Negative for dysuria and urgency.  Musculoskeletal: Positive for joint pain. Negative for back pain.  Skin: Negative for rash.  Neurological: Negative for dizziness, speech change, focal weakness and headaches.  Endo/Heme/Allergies: Does not bruise/bleed easily.  Psychiatric/Behavioral: Negative for depression.    DRUG ALLERGIES:   Allergies  Allergen Reactions  . Penicillins Other (See Comments)    Unknown reaction   VITALS:  Blood pressure 129/60, pulse 73, temperature 99.1 F (37.3 C), temperature source Oral, resp. rate 16, height 5\' 9"  (1.753 m), weight 125 lb 10.6 oz (57 kg), SpO2 97 %. PHYSICAL EXAMINATION:  Physical Exam  Constitutional: He is oriented to person, place, and time. He appears malnourished. He appears unhealthy. He has a sickly appearance.  HENT:  Head: Normocephalic and atraumatic.  Eyes: Pupils are equal, round, and reactive to light. Conjunctivae and EOM are normal.  Neck: Normal range of motion. Neck supple. No tracheal deviation present. No thyromegaly present.  Cardiovascular: Normal rate, regular rhythm and normal heart sounds.   Pulmonary/Chest: Effort normal and breath sounds normal. No respiratory distress.  He has no wheezes. He exhibits no tenderness.  Abdominal: Soft. Bowel sounds are normal. He exhibits no distension. There is no tenderness.  Musculoskeletal: Normal range of motion.  Neurological: He is alert and oriented to person, place, and time. No cranial nerve deficit.  Skin: Skin is warm and dry. Rash noted.  Dressing in place  Psychiatric: Mood and affect normal.   LABORATORY PANEL:  Male CBC  Recent Labs Lab 11/25/16 0416  WBC 5.6  HGB 11.7*  HCT 36.4*  PLT 192   ------------------------------------------------------------------------------------------------------------------ Chemistries   Recent Labs Lab 11/23/16 1044  11/25/16 0416  NA 136  < > 138  K 3.9  < > 3.5  CL 98*  < > 99*  CO2 29  < > 30  GLUCOSE 214*  < > 141*  BUN 13  < > 10  CREATININE 3.09*  < > 2.58*  CALCIUM 8.0*  < > 7.8*  AST 172*  --   --   ALT 41  --   --   ALKPHOS 138*  --   --   BILITOT 0.6  --   --   < > = values in this interval not displayed. RADIOLOGY:  No results found. ASSESSMENT AND PLAN:   1. osteomyelitis of the left fifth toe - Continue vancomycin and cefepime - Appreciate vascular surgery and podiatry input - status post surgery yesterday - Plan for vascular evaluation hopefully next Tuesday  2. End-stage renal disease on dialysis Monday Wednesday Friday - Just had dialysis earlier today  3. Essential hypertension continueon Norvasc and Coreg  4.  Hypoglycemia with history of diabetes: patient's blood sugars are now elevated I will place patient  back on sliding scale insulin increase his dose of Lantus to his baseline, patient now eating better she'll sugars should improve her stay higher  5. Hypothyroidism unspecified continue on Synthroid  6.  Hyperlipidemia, on statin  7.  Hypokalemia: Should get corrected with dialysis  8. Tobacco abuse. Smoking cessation counseling done     All the records are reviewed and case discussed with Care Management/Social  Worker. Management plans discussed with the patient, nursing and they are in agreement.  CODE STATUS: Full Code  TOTAL TIME TAKING CARE OF THIS PATIENT: 25 minutes.   More than 50% of the time was spent in counseling/coordination of care: YES  POSSIBLE D/C early next week, DEPENDING ON CLINICAL CONDITION.  And podiatry and vascular surgery evaluation   Auburn BilberryPATEL, Stepen Prins M.D on 11/25/2016 at 1:02 PM  Between 7am to 6pm - Pager - [phone]  After 6pm go to [email or web address] - Social research officer, governmentpassword EPAS [facility]  [facility] [place] [facility]  Office  [phone]  CC: Primary care physician; Lindwood QuaHoffman, Byron, MD  Note: This dictation was prepared with Dragon dictation along with smaller phrase technology. Any transcriptional errors that result from this process are unintentional.

## 2016-11-25 NOTE — Progress Notes (Signed)
TENNIS, MCKINNON ([id number]) Visit Report for 11/23/2016 Chief Complaint Document Details Patient Name: Marcus [name], Marcus [name]. Date of Service: 11/23/2016 8:00 AM Medical Record Number: [id number] Patient Account Number: [id number] Date of Birth/Sex: 11/12/1955 (61 y.o. Male) Treating RN: Curtis Sites Primary Care Provider: Lindwood Qua Other Clinician: Referring Provider: Lindwood Qua Treating Provider/Extender: Linwood Dibbles, HOYT Weeks in Treatment: 0 Information Obtained from: Patient Chief Complaint Left 5th Metatarsal Ulcer Electronic Signature(s) Signed: 11/24/2016 10:18:42 AM By: Lenda Kelp PA-C Entered By: Lenda Kelp on 11/24/2016 09:12:26 Marcus [name] ([id number]) -------------------------------------------------------------------------------- HPI Details Patient Name: Marcus [name] Date of Service: 11/23/2016 8:00 AM Medical Record Number: [id number] Patient Account Number: [id number] Date of Birth/Sex: 08/05/1955 (61 y.o. Male) Treating RN: Curtis Sites Primary Care Provider: Lindwood Qua Other Clinician: Referring Provider: Lindwood Qua Treating Provider/Extender: Linwood Dibbles, HOYT Weeks in Treatment: 0 History of Present Illness HPI Description: 11/23/16 on evaluation today patient presented with a significant ulcer of his left lateral foot at the fifth metatarsal region. From best I can tell based on the skin integrity report received from his nursing facility it appears that he has had this wound since 10/04/16 or at least that's when it was initially evaluated. The wound appears to have consistently been enlarging according to documentation with some notation of odor occasionally. Fortunately he has not been experiencing pain significantly at the site according to documentation as well. With that being said on evaluation today there appears to be frothy bloody discharge from the wound. There is also surrounding erythema noted on evaluation today. No  fevers, chills, nausea, or vomiting noted at this time. Otherwise patient was not able to provide much more in the way of history. He does not appear to be having pain on evaluation today. Electronic Signature(s) Signed: 11/24/2016 10:18:42 AM By: Lenda Kelp PA-C Entered By: Lenda Kelp on 11/24/2016 09:48:25 Marcus [name] ([id number]) -------------------------------------------------------------------------------- Physical Exam Details Patient Name: Marcus [name], Marcus [name] Date of Service: 11/23/2016 8:00 AM Medical Record Number: [id number] Patient Account Number: [id number] Date of Birth/Sex: 1955-09-09 (61 y.o. Male) Treating RN: Curtis Sites Primary Care Provider: Lindwood Qua Other Clinician: Referring Provider: Lindwood Qua Treating Provider/Extender: STONE III, HOYT Weeks in Treatment: 0 Constitutional Thin and well-hydrated in no acute distress. Eyes conjunctiva clear no eyelid edema noted. pupils equal round and reactive to light and accommodation. Ears, Nose, Mouth, and Throat no gross abnormality of ear auricles or external auditory canals. normal hearing noted during conversation. mucus membranes moist. Respiratory normal breathing without difficulty. clear to auscultation bilaterally. Cardiovascular regular rate and rhythm with normal S1, S2. 1+ dorsalis pedis/posterior tibialis pulses. no clubbing, cyanosis, significant edema, <3 sec cap refill. Gastrointestinal (GI) soft, non-tender, non-distended, +BS. no ventral hernia noted. Musculoskeletal Patient unable to walk without assistance. Psychiatric Patient is not able to cooperate in decision making regarding care. Patient is oriented to person only. pleasant and cooperative. Notes Patient's wound shows erythema surrounding the wound currently that seems to be evidence of infection coupled with the fact that he has prostate gas-filled serosanguineous drainage from the wound itself. The wound bed as far  as the hyper granulation noted is extremely friable to even light touch. There is a large central necrotic region noted at this point but the wound also appears to probe down to bone. Electronic Signature(s) Signed: 11/24/2016 10:18:42 AM By: Lenda Kelp PA-C Entered By: Lenda Kelp on 11/24/2016 09:55:48 Marcus [name] ([id number]) -------------------------------------------------------------------------------- Physician Orders Details Patient Name: Marcus [name]  Date of Service: 11/23/2016 8:00 AM Medical Record Number: [id number] Patient Account Number: [id number] Date of Birth/Sex: [birth year] (61 y.o. Male) Treating RN: Curtis Sites Primary Care Provider: Lindwood Qua Other Clinician: Referring Provider: Lindwood Qua Treating Provider/Extender: STONE III, HOYT Weeks in Treatment: 0 Verbal / Phone Orders: No Diagnosis Coding Wound Cleansing Wound #1 Left,Lateral Foot o Clean wound with Normal Saline. Anesthetic Wound #1 Left,Lateral Foot o Topical Lidocaine 4% cream applied to wound bed prior to debridement Primary Wound Dressing Wound #1 Left,Lateral Foot o Aquacel Ag Secondary Dressing Wound #1 Left,Lateral Foot o Gauze, ABD and Kerlix/Conform Dressing Change Frequency Wound #1 Left,Lateral Foot o Change dressing every day. Follow-up Appointments Wound #1 Left,Lateral Foot o Other: - Please return if needed once you are released from the hospital Off-Loading Wound #1 Left,Lateral Foot o Other: - continue wearing off loading boots Notes Patient to go to the ER for evaluation of wound on foot r/t wound probing to bone, purulent drainage, erythema and warmth Electronic Signature(s) Signed: 11/23/2016 4:54:09 PM By: Rennie Natter ([id number]) Signed: 11/24/2016 10:18:42 AM By: Lenda Kelp PA-C Entered By: Curtis Sites on 11/23/2016 09:30:54 Marcus [name]  ([id number]) -------------------------------------------------------------------------------- Problem List Details Patient Name: Marcus [name] Date of Service: 11/23/2016 8:00 AM Medical Record Number: [id number] Patient Account Number: [id number] Date of Birth/Sex: 17-Nov-1955 (61 y.o. 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 Male) Treating RN: Curtis Sites Primary Care Provider: Lindwood Qua Other Clinician: Referring Provider: Lindwood Qua Treating Provider/Extender: Linwood Dibbles, HOYT Weeks in Treatment: 0 Subjective Chief Complaint Information obtained from Patient Left 5th Metatarsal Ulcer History of Present Illness (HPI) 11/23/16 on evaluation today patient presented with a significant ulcer of his left lateral foot at the fifth metatarsal region. From best I can tell based on the skin integrity report received from his nursing facility it appears that he has had this wound since 10/04/16 or at least that's when it was initially evaluated. The wound appears to have  consistently been enlarging according to documentation with some notation of odor occasionally. Fortunately he has not been experiencing pain significantly at the site according to documentation as well. With that being said on evaluation today there appears to be frothy bloody discharge from the wound. There is also surrounding erythema noted on evaluation today. No fevers, chills, nausea, or vomiting noted at this time. Otherwise patient was not able to provide much more in the way of history. He does not appear to be having pain on evaluation today. Wound History Patient presents with 1 open wound that has been present for approximately unknown. Patient has been treating wound in the following manner: medihoney. Laboratory tests have not been performed in the last month. Patient reportedly has not tested positive for an antibiotic resistant organism. Patient reportedly has not tested positive  for osteomyelitis. Patient reportedly has not had testing performed to evaluate circulation in the legs. Patient History Information obtained from Patient. Allergies penicillin, azithromycin Social History Former smoker, Marital Status - Single, Alcohol Use - Never, Drug Use - No History, Caffeine Use - Daily. Medical History Respiratory Patient has history of Chronic Obstructive Pulmonary Disease (COPD) Cardiovascular Patient has history of Hypertension Marcus [name], [name] ([id number]) Endocrine Patient has history of Type I Diabetes Neurologic Patient has history of Neuropathy Oncologic Denies history of Received Chemotherapy, Received Radiation Psychiatric Denies history of Anorexia/bulimia, Confinement Anxiety Patient is treated with Insulin. Medical And Surgical History Notes Eyes retinopathy Review of Systems (ROS) Constitutional Symptoms (General Health) The patient has no complaints or symptoms. Eyes The patient has no complaints or symptoms. Ear/Nose/Mouth/Throat The patient  has no complaints or symptoms. Hematologic/Lymphatic The patient has no complaints or symptoms. Respiratory The patient has no complaints or symptoms. Cardiovascular The patient has no complaints or symptoms. Gastrointestinal The patient has no complaints or symptoms. Endocrine The patient has no complaints or symptoms. 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 Immunological The patient has no complaints or symptoms. Integumentary (Skin) The patient has no complaints or symptoms. Neurologic The patient has no complaints or symptoms. Oncologic The patient has no complaints or symptoms. Psychiatric The patient has no complaints or symptoms. Marcus [name], [name] ([id number]) Objective Constitutional Thin and well-hydrated in no acute distress. Vitals Time Taken: 8:21 AM, Height: 74 in, Source: Measured, Weight: 152 lbs, Source: Measured, BMI: 19.5, Temperature: 98.5 F, Pulse: 70 bpm, Respiratory Rate: 18 breaths/min, Blood Pressure: 120/59 mmHg. Eyes conjunctiva clear no eyelid edema noted. pupils equal round and reactive to light and accommodation. Ears, Nose, Mouth, and Throat no gross abnormality of ear auricles or external auditory canals. normal hearing noted during conversation. mucus membranes moist. Respiratory normal breathing without difficulty. clear to auscultation bilaterally. Cardiovascular regular rate and rhythm with normal S1, S2. 1+ dorsalis pedis/posterior tibialis pulses. no clubbing, cyanosis, significant edema, Gastrointestinal (GI) soft, non-tender, non-distended, +BS. no ventral hernia noted. Musculoskeletal Patient unable to walk without assistance. Psychiatric Patient is not able to cooperate in decision making regarding care. Patient is oriented to person only. pleasant and cooperative. General Notes: Patient's wound shows erythema surrounding the wound currently that seems to be evidence of  infection coupled with the fact that he has prostate gas-filled serosanguineous drainage from the wound itself. The wound bed as far as the hyper granulation noted is extremely friable to even light touch. There is a large central necrotic region noted at this point but the wound also appears to probe down to bone. Integumentary (Hair, Skin) Wound #1 status is Open. Original cause of wound was Not Known. The wound is located on the Left,Lateral Foot. The wound measures 2.7cm length x 4.2cm width x 1.7cm depth; 8.906cm^2 area and 15.141cm^3 volume. There is Fat Layer (Subcutaneous Tissue) Exposed exposed. There is no tunneling or undermining noted. There is a large amount of purulent drainage noted. The wound margin is flat and intact. There is no granulation within the wound bed. There is a large (67-100%) amount of necrotic tissue within the wound bed including Eschar and Adherent Slough. The periwound skin appearance exhibited: Marcus [name], [name] ([id number]) Erythema. The periwound skin appearance did not exhibit: Callus, Crepitus, Excoriation, Induration, Rash, Scarring, Dry/Scaly, Maceration, Atrophie Blanche, Cyanosis, Ecchymosis, Hemosiderin Staining, Mottled, Pallor, Rubor. The surrounding wound skin color is noted with erythema which is circumferential. Periwound temperature  was noted as Hot. 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 Anesthetic: Wound #1 Left,Lateral Foot: Topical Lidocaine 4% cream applied to wound bed prior to debridement Primary Wound Dressing: Wound #1 Left,Lateral Foot: Aquacel Ag Secondary Dressing: Wound #1 Left,Lateral Foot: Gauze, ABD and Kerlix/Conform Dressing Change Frequency: Wound #1  Left,Lateral Foot: Change dressing every day. Follow-up Appointments: Wound #1 Left,Lateral Foot: Other: - Please return if needed once you are released from the hospital Off-Loading: Wound #1 Left,Lateral Foot: Other: - continue wearing off loading boots General Notes: Patient to go to the ER for evaluation of wound on foot r/t wound probing to bone, purulent drainage, erythema and warmth Marcus [name], HAMRE. ([id number]) Following evaluation today I recommended that we send patient to the ER as soon as possible due to the severity of this wound. I contacted the facility as well which was [facility] and spoke with Kennyth Arnold as the Interior and spatial designer of nursing [employer] as well as the nursing supervisor Morrie Sheldon were not available at that point. I was advised to go ahead and send him from here to the department per my recommendation. Therefore the order was completed and EMS actually was called to transport him to the ER. We will see him back to pin on the evaluation helping proceed. Electronic Signature(s) Signed: 11/24/2016 10:18:42 AM By: Lenda Kelp PA-C Entered By: Lenda Kelp on 11/24/2016 09:57:51 Marcus [name] ([id number]) -------------------------------------------------------------------------------- ROS/PFSH Details Patient Name: Marcus [name] Date of Service: 11/23/2016 8:00 AM Medical Record Number: [id number] Patient Account Number: [id number] Date of Birth/Sex: 1955-12-25 (61 y.o. Male) Treating RN: Curtis Sites Primary Care Provider: Lindwood Qua Other Clinician: Referring Provider: Lindwood Qua Treating Provider/Extender: STONE III, HOYT Weeks in Treatment: 0 Information Obtained From Patient Wound History Do you currently have one or more open woundso Yes How many open wounds do you currently haveo 1 Approximately how long have you had your woundso unknown How have you been treating your wound(s) until nowo medihoney Has your wound(s) ever healed  and then re-openedo No Have you had any lab work done in the past montho No Have you tested positive for an antibiotic resistant organism (MRSA, VRE)o No Have you tested positive for osteomyelitis (bone infection)o No Have you had any tests for circulation on your legso No Genitourinary Complaints and Symptoms: Positive for: 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 ([id number]) Respiratory Complaints and Symptoms: No Complaints or Symptoms Medical History: Positive for: Chronic Obstructive Pulmonary Disease (COPD) Cardiovascular Complaints and Symptoms: No Complaints or Symptoms Medical History: Positive for: Hypertension Gastrointestinal Complaints and Symptoms: No Complaints or Symptoms Endocrine Complaints and Symptoms: No Complaints or Symptoms Medical History: Positive for: Type I Diabetes Time with diabetes: since age 51 Treated with: Insulin Immunological Complaints and Symptoms: No Complaints or Symptoms Integumentary (Skin) Complaints and Symptoms: No Complaints or Symptoms Neurologic Complaints and Symptoms: No Complaints or Symptoms Medical History: Positive for: Neuropathy Oncologic DAJON, LAZAR ([id number]) Complaints and Symptoms: No Complaints or Symptoms Medical  History: Negative for: Received Chemotherapy; Received Radiation Psychiatric Complaints and Symptoms: No Complaints or Symptoms Medical History: Negative for: Anorexia/bulimia; Confinement Anxiety Immunizations Pneumococcal Vaccine: Received Pneumococcal Vaccination: Yes Immunization Notes: up to  date Family and Social History Former smoker; Marital Status - Single; Alcohol Use: Never; Drug Use: No History; Caffeine Use: Daily; Financial Concerns: No; Food, Clothing or Shelter Needs: No; Support System Lacking: No; Transportation Concerns: No; Do not resuscitate: Yes Electronic Signature(s) Signed: 11/23/2016 4:54:09 PM By: Curtis Sites Signed: 11/24/2016 10:18:42 AM By: Lenda Kelp PA-C Entered By: Curtis Sites on 11/23/2016 08:30:08 Marcus [name] ([id number]) -------------------------------------------------------------------------------- SuperBill Details Patient Name: Marcus [name] Date of Service: 11/23/2016 Medical Record Number: [id number] Patient Account Number: [id number] Date of Birth/Sex: December 29, 1955 (61 y.o. Male) Treating RN: Curtis Sites Primary Care Provider: Lindwood Qua Other Clinician: Referring Provider: Lindwood Qua Treating Provider/Extender: Linwood Dibbles, HOYT Weeks in Treatment: 0 Diagnosis Coding ICD-10 Codes Code Description E11.621 Type 2 diabetes mellitus with foot ulcer L97.528 Non-pressure chronic ulcer of other part of left foot with other specified severity L03.116 Cellulitis of left lower limb Facility Procedures CPT4 Code: [id number] Description: 99214 - WOUND CARE VISIT-LEV 4 EST PT Modifier: Quantity: 1 Physician Procedures CPT4: Description Modifier Quantity Code [id number] - WC PHYS LEVEL 4 - NEW PT 1 ICD-10 Description Diagnosis E11.621 Type 2 diabetes mellitus with foot ulcer L97.528 Non-pressure chronic ulcer of other part of left foot with other specified severity  L03.116 Cellulitis of left lower limb Electronic Signature(s) Unsigned Previous Signature: 11/24/2016 10:18:42 AM Version By: Lenda Kelp PA-C Previous Signature: 11/24/2016 9:58:01 AM Version By: Curtis Sites Entered By: Francie Massing on 11/24/2016 13:03:26 Signature(s): Date(s):

## 2016-11-25 NOTE — Progress Notes (Signed)
Marcus [name], [name] ([id number]) Visit Report for 11/23/2016 Abuse/Suicide Risk Screen Details Patient Name: Marcus [name], [name] Date of Service: 11/23/2016 8:00 AM Medical Record Number: [id number] Patient Account Number: [id number] Date of Birth/Sex: [birth year] [age] y.o. Male) Treating RN: Curtis Sitesorthy, Joanna Primary Care Kenneith Stief: Lindwood QuaHOFFMAN, BYRON Other Clinician: Referring Pearson Picou: Lindwood QuaHOFFMAN, BYRON Treating Kamdon Reisig/Extender: STONE III, HOYT Weeks in Treatment: 0 Abuse/Suicide Risk Screen Items Answer ABUSE/SUICIDE RISK SCREEN: Has anyone close to you tried to hurt or harm you recentlyo No Do you feel uncomfortable with anyone in your familyo No Has anyone forced you do things that you didnot want to doo No Do you have any thoughts of harming yourselfo No Patient displays signs or symptoms of abuse and/or neglect. No Electronic Signature(s) Signed: 11/23/2016 4:54:09 PM By: Curtis Sitesorthy, Joanna Entered By: Curtis Sitesorthy, Joanna on 11/23/2016 08:22:39 Marcus [name], [name] ([id number]) -------------------------------------------------------------------------------- Activities of Daily Living Details Patient Name: Marcus [name], [name] Date of Service: 11/23/2016 8:00 AM Medical Record Number: [id number] Patient Account Number: [id number] Date of Birth/Sex: [birth year] [age] y.o. Male) Treating RN: Curtis Sitesorthy, Joanna Primary Care Leilany Digeronimo: Lindwood QuaHOFFMAN, BYRON Other Clinician: Referring Endy Easterly: Lindwood QuaHOFFMAN, BYRON Treating Kiaria Quinnell/Extender: STONE III, HOYT Weeks in Treatment: 0 Activities of Daily Living Items Answer Activities of Daily Living (Please select one for each item) Drive Automobile Not Able Take Medications Need Assistance Use Telephone Need Assistance Care for Appearance Need Assistance Use Toilet Need Assistance Bath / Shower Need Assistance Dress Self Need Assistance Feed Self Completely Able Walk Not Able Get In / Out Bed Need Assistance Housework Not Able Prepare Meals Not Able Handle Money Not  Able Shop for Self Need Assistance Electronic Signature(s) Signed: 11/23/2016 4:54:09 PM By: Curtis Sitesorthy, Joanna Entered By: Curtis Sitesorthy, Joanna on 11/23/2016 08:23:22 Marcus [name], [name] ([id number]) -------------------------------------------------------------------------------- Education Assessment Details Patient Name: Marcus [name], [name] Date of Service: 11/23/2016 8:00 AM Medical Record Number: [id number] Patient Account Number: [id number] Date of Birth/Sex: [birth year] [age] y.o. Male) Treating RN: Curtis Sitesorthy, Joanna Primary Care Caedence Snowden: Lindwood QuaHOFFMAN, BYRON Other Clinician: Referring Shavar Gorka: Lindwood QuaHOFFMAN, BYRON Treating Shakara Tweedy/Extender: Linwood DibblesSTONE III, HOYT Weeks in Treatment: 0 Primary Learner Assessed: Caregiver SNF nurses Learning Preferences/Education Level/Primary Language Learning Preference: Printed Material Highest Education Level: College or Above Preferred Language: English Cognitive Barrier Assessment/Beliefs Language Barrier: No Translator Needed: No Memory Deficit: No Emotional Barrier: No Cultural/Religious Beliefs Affecting Medical No Care: Physical Barrier Assessment Impaired Vision: No Impaired Hearing: No Decreased Hand dexterity: No Knowledge/Comprehension Assessment Knowledge Level: Medium Comprehension Level: Medium Ability to understand written Medium instructions: Ability to understand verbal Medium instructions: Motivation Assessment Anxiety Level: Calm Cooperation: Cooperative Education Importance: Acknowledges Need Interest in Health Problems: Asks Questions Perception: Coherent Willingness to Engage in Self- Medium Management Activities: Readiness to Engage in Self- Medium Management Activities: Electronic Signature(s) Marcus [name], [name] ([id number]) Signed: 11/23/2016 4:54:09 PM By: Curtis Sitesorthy, Joanna Entered By: Curtis Sitesorthy, Joanna on 11/23/2016 08:23:58 Marcus [name], [name]  ([id number]) -------------------------------------------------------------------------------- Fall Risk Assessment Details Patient Name: Marcus [name], Marcus [name] O. Date of Service: 11/23/2016 8:00 AM Medical Record Number: [id number] Patient Account Number: [id number] Date of Birth/Sex: [birth year] [age] y.o. Male) Treating RN: Curtis Sitesorthy, Joanna Primary Care Camielle Sizer: Lindwood QuaHOFFMAN, BYRON Other Clinician: Referring Jaquayla Hege: Lindwood QuaHOFFMAN, BYRON Treating Renardo Cheatum/Extender: STONE III, HOYT Weeks in Treatment: 0 Fall Risk Assessment Items Have you had 2 or more falls in the last 12 monthso 0 No Have you had any fall that resulted in injury in the last 12 monthso 0 No FALL RISK ASSESSMENT: History of falling - immediate or within 3 months 0 No Secondary diagnosis 0 No Ambulatory aid None/bed rest/wheelchair/nurse  0 No Crutches/cane/walker 15 Yes Furniture 0 No IV Access/Saline Lock 0 No Gait/Training Normal/bed rest/immobile 0 No Weak 10 Yes Impaired 0 No Mental Status Oriented to own ability 0 Yes Electronic Signature(s) Signed: 11/23/2016 4:54:09 PM By: Curtis Sites Entered By: Curtis Sites on 11/23/2016 08:24:11 Marcus Mutters ([id number]) -------------------------------------------------------------------------------- Nutrition Risk Assessment Details Patient Name: Marcus Mutters Date of Service: 11/23/2016 8:00 AM Medical Record Number: [id number] Patient Account Number: [id number] Date of Birth/Sex: 1955-10-18 (61 y.o. Male) Treating RN: Curtis Sites Primary Care Suheyb Raucci: Lindwood Qua Other Clinician: Referring Atley Neubert: Lindwood Qua Treating Jahmiyah Dullea/Extender: STONE III, HOYT Weeks in Treatment: 0 Height (in): 74 Weight (lbs): 152 Body Mass Index (BMI): 19.5 Nutrition Risk Assessment Items NUTRITION RISK SCREEN: I have an illness or condition that made me change the kind and/or 0 No amount of food I eat I eat fewer than two meals per day 0 No I eat few fruits and vegetables,  or milk products 0 No I have three or more drinks of beer, liquor or wine almost every day 0 No I have tooth or mouth problems that make it hard for me to eat 0 No I don't always have enough money to buy the food I need 0 No I eat alone most of the time 0 No I take three or more different prescribed or over-the-counter drugs a 1 Yes day Without wanting to, I have lost or gained 10 pounds in the last six 0 No months I am not always physically able to shop, cook and/or feed myself 0 No Nutrition Protocols Good Risk Protocol 0 No interventions needed Moderate Risk Protocol Electronic Signature(s) Signed: 11/23/2016 4:54:09 PM By: Curtis Sites Entered By: Curtis Sites on 11/23/2016 08:24:56

## 2016-11-25 NOTE — Progress Notes (Signed)
1 Day Post-Op   Subjective/Chief Complaint: Patient seen. Relates some significant pain in the left foot, approximately 6-7.   Objective: Vital signs in last 24 hours: Temp:  [97.2 F (36.2 C)-98.4 F (36.9 C)] 98.4 F (36.9 C) (08/11 0538) Pulse Rate:  [56-67] 67 (08/11 0538) Resp:  [4-16] 16 (08/11 0538) BP: (93-140)/(43-95) 140/67 (08/11 0538) SpO2:  [100 %] 100 % (08/11 0538) Last BM Date: 11/23/16  Intake/Output from previous day: 08/10 0701 - 08/11 0700 In: 100 [I.V.:100] Out: 1503 [Blood:3] Intake/Output this shift: No intake/output data recorded.  The bandage on the left foot is dry and intact. No evidence of any strike through.  Lab Results:   Recent Labs  11/24/16 0500 11/25/16 0416  WBC 4.0 5.6  HGB 11.3* 11.7*  HCT 34.5* 36.4*  PLT 180 192   BMET  Recent Labs  11/24/16 0500 11/25/16 0416  NA 138 138  K 3.3* 3.5  CL 100* 99*  CO2 29 30  GLUCOSE 71 141*  BUN 19 10  CREATININE 3.50* 2.58*  CALCIUM 8.1* 7.8*   PT/INR No results for input(s): LABPROT, INR in the last 72 hours. ABG No results for input(s): PHART, HCO3 in the last 72 hours.  Invalid input(s): PCO2, PO2  Studies/Results: No results found.  Anti-infectives: Anti-infectives    Start     Dose/Rate Route Frequency Ordered Stop   11/24/16 1200  vancomycin (VANCOCIN) 500 mg in sodium chloride 0.9 % 100 mL IVPB     500 mg 100 mL/hr over 60 Minutes Intravenous Every M-[name]-F (Hemodialysis) 11/23/16 1545     11/24/16 1000  ceFEPIme (MAXIPIME) 1 g in dextrose 5 % 50 mL IVPB     1 g 100 mL/hr over 30 Minutes Intravenous Every 24 hours 11/23/16 1545     11/23/16 1600  vancomycin (VANCOCIN) 500 mg in sodium chloride 0.9 % 100 mL IVPB     500 mg 100 mL/hr over 60 Minutes Intravenous  Once 11/23/16 1545 11/23/16 1835   11/23/16 1400  ceFEPIme (MAXIPIME) 1 g in dextrose 5 % 50 mL IVPB     1 g 100 mL/hr over 30 Minutes Intravenous  Once 11/23/16 1348 11/23/16 1905   11/23/16 1200   vancomycin (VANCOCIN) IVPB 1000 mg/200 mL premix     1,000 mg 200 mL/hr over 60 Minutes Intravenous  Once 11/23/16 1151 11/23/16 1256      Assessment/Plan: s/p Procedure(s): RAY RESECTION-LEFT 5TH RAY (Left) Assessment: Osteomyelitis left foot, stable status post surgery   Plan: Dressing was left intact. We'll plan for dressing change and wound evaluation tomorrow. Patient may have bathroom privileges with assistance and pressure only on the heel of the left foot.  LOS: 2 days    Marcus [name] Marcus [name] 11/25/2016

## 2016-11-26 LAB — PHOSPHORUS: PHOSPHORUS: 1.3 mg/dL — AB (ref 2.5–4.6)

## 2016-11-26 LAB — GLUCOSE, CAPILLARY
GLUCOSE-CAPILLARY: 213 mg/dL — AB (ref 65–99)
GLUCOSE-CAPILLARY: 110 mg/dL — AB (ref 65–99)
GLUCOSE-CAPILLARY: 122 mg/dL — AB (ref 65–99)
Glucose-Capillary: 290 mg/dL — ABNORMAL HIGH (ref 65–99)

## 2016-11-26 LAB — HIV ANTIBODY (ROUTINE TESTING W REFLEX): HIV Screen 4th Generation wRfx: NONREACTIVE

## 2016-11-26 NOTE — Progress Notes (Signed)
[facility] [place] [facility]   PATIENT NAME: Marcus [name]    MR#:  [id number]  DATE OF BIRTH:  Aug 28, 1955  SUBJECTIVE:  CHIEF COMPLAINT:   Chief Complaint  Patient presents with  . Wound Infection   Patient's pain is under control otherwise denies any symptoms   REVIEW OF SYSTEMS:  Review of Systems  Constitutional: Negative for chills, fever and weight loss.  HENT: Negative for nosebleeds and sore throat.   Eyes: Negative for blurred vision.  Respiratory: Negative for cough, shortness of breath and wheezing.   Cardiovascular: Negative for chest pain, orthopnea, leg swelling and PND.  Gastrointestinal: Negative for abdominal pain, constipation, diarrhea, heartburn, nausea and vomiting.  Genitourinary: Negative for dysuria and urgency.  Musculoskeletal: Positive for joint pain. Negative for back pain.  Skin: Negative for rash.  Neurological: Negative for dizziness, speech change, focal weakness and headaches.  Endo/Heme/Allergies: Does not bruise/bleed easily.  Psychiatric/Behavioral: Negative for depression.    DRUG ALLERGIES:   Allergies  Allergen Reactions  . Penicillins Other (See Comments)    Unknown reaction   VITALS:  Blood pressure (!) 100/51, pulse 63, temperature 98.4 F (36.9 C), temperature source Oral, resp. rate 18, height 5\' 9"  (1.753 m), weight 125 lb 10.6 oz (57 kg), SpO2 100 %. PHYSICAL EXAMINATION:  Physical Exam  Constitutional: He is oriented to person, place, and time. He appears malnourished. He appears unhealthy. He has a sickly appearance.  HENT:  Head: Normocephalic and atraumatic.  Eyes: Pupils are equal, round, and reactive to light. Conjunctivae and EOM are normal.  Neck: Normal range of motion. Neck supple. No tracheal deviation present. No thyromegaly present.  Cardiovascular: Normal rate, regular rhythm and normal heart sounds.   Pulmonary/Chest: Effort normal and breath sounds normal. No respiratory  distress. He has no wheezes. He exhibits no tenderness.  Abdominal: Soft. Bowel sounds are normal. He exhibits no distension. There is no tenderness.  Musculoskeletal: Normal range of motion.  Neurological: He is alert and oriented to person, place, and time. No cranial nerve deficit.  Skin: Skin is warm and dry. Rash noted.  Dressing in place  Psychiatric: Mood and affect normal.   LABORATORY PANEL:  Male CBC  Recent Labs Lab 11/25/16 0416  WBC 5.6  HGB 11.7*  HCT 36.4*  PLT 192   ------------------------------------------------------------------------------------------------------------------ Chemistries   Recent Labs Lab 11/23/16 1044  11/25/16 0416  NA 136  < > 138  K 3.9  < > 3.5  CL 98*  < > 99*  CO2 29  < > 30  GLUCOSE 214*  < > 141*  BUN 13  < > 10  CREATININE 3.09*  < > 2.58*  CALCIUM 8.0*  < > 7.8*  AST 172*  --   --   ALT 41  --   --   ALKPHOS 138*  --   --   BILITOT 0.6  --   --   < > = values in this interval not displayed. RADIOLOGY:  No results found. ASSESSMENT AND PLAN:   1. osteomyelitis of the left fifth toe - Continue vancomycin and cefepime - Appreciate vascular surgery and podiatry input - status post surgery   - Await culture results - Plan for vascular evaluation hopefully next Tuesday - We have to keep patient until the vascular evaluation is done  2. End-stage renal disease on dialysis Monday Wednesday Friday - Continue hemodialysis  3. Essential hypertension continueon Norvasc and Coreg  4.  Hypoglycemia with  history of diabetes: Patient now eating blood sugars are back to being fluctuant continue Lantus 15 units  5. Hypothyroidism unspecified continue on Synthroid  6.  Hyperlipidemia, on statin  7.  Hypokalemia: stable with dialysis  8. Tobacco abuse. Smoking cessation counseling done     All the records are reviewed and case discussed with Care Management/Social Worker. Management plans discussed with the patient,  nursing and they are in agreement.  CODE STATUS: Full Code  TOTAL TIME TAKING CARE OF THIS PATIENT: 25 minutes.   More than 50% of the time was spent in counseling/coordination of care: YES  POSSIBLE D/C early next week, DEPENDING ON CLINICAL CONDITION.  And podiatry and vascular surgery evaluation   Auburn Bilberry M.D on 11/26/2016 at 12:20 PM  Between 7am to 6pm - Pager - [phone]  After 6pm go to [email or web address] - Social research officer, government  [facility]  Office  [phone]  CC: Primary care physician; Lindwood Qua, MD  Note: This dictation was prepared with Dragon dictation along with smaller phrase technology. Any transcriptional errors that result from this process are unintentional.

## 2016-11-26 NOTE — Progress Notes (Signed)
Marcus [name] ([id number]) Visit Report for 11/23/2016 Allergy List Details Patient Name: Marcus [name], Marcus [name]. Date of Service: 11/23/2016 8:00 AM Medical Record Number: [id number] Patient Account Number: [id number] Date of Birth/Sex: December 07, 1955 (61 y.o. Male) Treating RN: Marcus [name] Primary Care Marcus [name]: Marcus [name] Other Clinician: Referring Marcus [name]: Marcus [name] Treating Marcus Marcus [name] Weeks in Treatment: 0 Allergies Active Allergies penicillin azithromycin Allergy Notes Electronic Signature(s) Signed: 11/23/2016 4:54:09 PM By: Marcus [name] Entered By: Marcus [name] on 11/23/2016 08:22:28 Marcus [name] ([id number]) -------------------------------------------------------------------------------- Arrival Information Details Patient Name: Marcus [name] Date of Service: 11/23/2016 8:00 AM Medical Record Number: [id number] Patient Account Number: [id number] Date of Birth/Sex: 1955/07/24 (61 y.o. Male) Treating RN: Marcus [name] Primary Care Marcus [name]: Marcus [name] Other Clinician: Referring Marcus [name]: Marcus [name] Treating Samaiya Awadallah/Extender: Marcus [name], [name] Weeks in Treatment: 0 Visit Information Patient Arrived: Wheel Chair Arrival Time: 08:17 Accompanied By: caregiver Transfer Assistance: Manual Patient Identification Verified: Yes Secondary Verification Process Yes Completed: Patient Has Alerts: Yes Patient Alerts: Patient on Blood Thinner DMI Heparin Electronic Signature(s) Signed: 11/23/2016 4:54:09 PM By: Marcus [name] Entered By: Marcus [name] on 11/23/2016 08:25:39 Marcus [name] ([id number]) -------------------------------------------------------------------------------- Clinic Level of Care Assessment Details Patient Name: Marcus [name] Date of Service: 11/23/2016 8:00 AM Medical Record Number: [id number] Patient Account Number: [id number] Date of Birth/Sex: Oct 27, 1955 (61 y.o. Male) Treating RN: Marcus [name] Primary Care Sruthi Maurer: Marcus [name] Other Clinician: Referring Gaspard Isbell: Marcus [name] Treating Phallon Haydu/Extender: Marcus [name] Weeks in Treatment: 0 Clinic Level of Care Assessment Items TOOL 2 Quantity Score []  - Use when only an EandM is performed on the INITIAL visit 0 ASSESSMENTS - Nursing Assessment / Reassessment X - General Physical Exam (combine w/ comprehensive assessment (listed just 1 20 below) when performed on new pt. evals) X - Comprehensive Assessment (HX, ROS, Risk Assessments, Wounds Hx, etc.) 1 25 ASSESSMENTS - Wound and Skin Assessment / Reassessment X - Simple Wound Assessment / Reassessment - one wound 1 5 []  - Complex Wound Assessment / Reassessment - multiple wounds 0 []  - Dermatologic / Skin Assessment (not related to wound area) 0 ASSESSMENTS - Ostomy and/or Continence Assessment and Care []  - Incontinence Assessment and Management 0 []  - Ostomy Care Assessment and Management (repouching, etc.) 0 PROCESS - Coordination of Care X - Simple Patient / Family Education for ongoing care 1 15 []  - Complex (extensive) Patient / Family Education for ongoing care 0 X - Staff obtains Chiropractor, Records, Test Results / Process Orders 1 10 []  - Staff telephones HHA, Nursing Homes / Clarify orders / etc 0 []  - Routine Transfer to another Facility (non-emergent condition) 0 []  - Routine Hospital Admission (non-emergent condition) 0 X - New Admissions / Manufacturing engineer / Ordering NPWT, Apligraf, etc. 1 15 []  - Emergency Hospital Admission (emergent condition) 0 X - Simple Discharge Coordination 1 10 Marcus [name], KROHN. ([id number]) []  - Complex (extensive) Discharge Coordination 0 PROCESS - Special Needs []  - Pediatric / Minor Patient Management 0 []  - Isolation Patient Management 0 []  - Hearing / Language / Visual special needs 0 []  - Assessment of Community assistance (transportation, D/C planning, etc.) 0 []  - Additional assistance / Altered  mentation 0 []  - Support Surface(s) Assessment (bed, cushion, seat, etc.) 0 INTERVENTIONS - Wound Cleansing / Measurement X - Wound Imaging (photographs - any number of wounds) 1 5 []  - Wound Tracing (instead of photographs) 0 X - Simple Wound Measurement - one wound 1 5 []  - Complex Wound Measurement -  multiple wounds 0 X - Simple Wound Cleansing - one wound 1 5 []  - Complex Wound Cleansing - multiple wounds 0 INTERVENTIONS - Wound Dressings []  - Small Wound Dressing one or multiple wounds 0 X - Medium Wound Dressing one or multiple wounds 1 15 []  - Large Wound Dressing one or multiple wounds 0 []  - Application of Medications - injection 0 INTERVENTIONS - Miscellaneous []  - External ear exam 0 []  - Specimen Collection (cultures, biopsies, blood, body fluids, etc.) 0 []  - Specimen(s) / Culture(s) sent or taken to Lab for analysis 0 []  - Patient Transfer (multiple staff / Michiel [name] Lift / Similar devices) 0 []  - Simple Staple / Suture removal (25 or less) 0 []  - Complex Staple / Suture removal (26 or more) 0 [name], [name] ([id number]) []  - Hypo / Hyperglycemic Management (close monitor of Blood Glucose) 0 X - Ankle / Brachial Index (ABI) - do not check if billed separately 1 15 Has the patient been seen at the hospital within the last three years: Yes Total Score: 145 Level Of Care: New/Established - Level 4 Electronic Signature(s) Signed: 11/24/2016 5:10:53 PM By: Marcus [name] Entered By: Marcus [name] on 11/24/2016 09:57:56 Marcus [name] ([id number]) -------------------------------------------------------------------------------- Encounter Discharge Information Details Patient Name: Marcus [name] Date of Service: 11/23/2016 8:00 AM Medical Record Number: [id number] Patient Account Number: [id number] Date of Birth/Sex: 1955-09-09 (61 y.o. Male) Treating RN: Marcus [name] Primary Care Page Pucciarelli: Marcus [name] Other Clinician: Referring Gaetan Spieker: Marcus [name] Treating  Elener Custodio/Extender: Marcus [name], [name] Weeks in Treatment: 0 Encounter Discharge Information Items Discharge Pain Level: 0 Discharge Condition: Stable Ambulatory Status: Stretcher Emergency Discharge Destination: Room Transportation: Ambulance Accompanied By: ems Schedule Follow-up Appointment: Yes Medication Reconciliation completed and provided to Patient/Care No Conrado Nance: Provided on Clinical Summary of Care: 11/23/2016 Form Type Recipient Paper Patient CS Electronic Signature(s) Signed: 11/24/2016 9:58:56 AM By: Marcus [name] Entered By: Marcus [name] on 11/24/2016 09:58:56 Marcus [name] ([id number]) -------------------------------------------------------------------------------- Lower Extremity Assessment Details Patient Name: Marcus [name] Date of Service: 11/23/2016 8:00 AM Medical Record Number: [id number] Patient Account Number: [id number] Date of Birth/Sex: 09/28/1955 (61 y.o. Male) Treating RN: Marcus [name] Primary Care Vala Raffo: Marcus [name] Other Clinician: Referring Janeane Cozart: Marcus [name] Treating Lafaye Mcelmurry/Extender: Marcus [name] Weeks in Treatment: 0 Edema Assessment Assessed: [Left: No] [Right: No] E[Left: dema] [Right: :] Calf Left: Right: Point of Measurement: 35 cm From Medial Instep 25.6 cm cm Ankle Left: Right: Point of Measurement: 14 cm From Medial Instep 19.2 cm cm Vascular Assessment Pulses: Dorsalis Pedis Palpable: [Left:Yes] Doppler Audible: [Left:Yes] Posterior Tibial Palpable: [Left:Yes] Doppler Audible: [Left:Yes] Extremity colors, hair growth, and conditions: Extremity Color: [Left:Mottled] Hair Growth on Extremity: [Left:No] Temperature of Extremity: [Left:Warm] Capillary Refill: [Left:< 3 seconds] Blood Pressure: Brachial: [Left:118] Dorsalis Pedis: 112 [Left:Dorsalis Pedis:] Ankle: Posterior Tibial: 110 [Left:Posterior Tibial: 0.95] Toe Nail Assessment Left: Right: Thick: Yes Discolored: Yes Deformed:  Yes Improper Length and Hygiene: No Marcus [name], Marcus [name] ([id number]) Electronic Signature(s) Signed: 11/23/2016 4:54:09 PM By: Marcus [name] Entered By: Marcus [name] on 11/23/2016 08:49:29 Marcus [name] ([id number]) -------------------------------------------------------------------------------- Multi Wound Chart Details Patient Name: Marcus [name] Date of Service: 11/23/2016 8:00 AM Medical Record Number: [id number] Patient Account Number: [id number] Date of Birth/Sex: 1955-12-01 (61 y.o. Male) Treating RN: Marcus [name] Primary Care Rakiyah Esch: Marcus [name] Other Clinician: Referring Derwin Reddy: Marcus [name] Treating Ruhee Enck/Extender: Marcus [name] Weeks in Treatment: 0 Vital Signs Height(in): 74 Pulse(bpm): 70 Weight(lbs): 152 Blood Pressure 120/59 (mmHg): Body Mass Index(BMI): 20 Temperature(F): 98.5 Respiratory Rate  18 (breaths/min): Photos: [1:No Photos] [N/A:N/A] Wound Location: [1:Left Foot - Lateral] [N/A:N/A] Wounding Event: [1:Not Known] [N/A:N/A] Primary Etiology: [1:Diabetic Wound/Ulcer of the Lower Extremity] [N/A:N/A] Comorbid History: [1:Chronic Obstructive Pulmonary Disease (COPD), Hypertension, Type I Diabetes, Neuropathy] [N/A:N/A] Date Acquired: [1:11/20/2016] [N/A:N/A] Weeks of Treatment: [1:0] [N/A:N/A] Wound Status: [1:Open] [N/A:N/A] Pending Amputation on Yes [N/A:N/A] Presentation: Measurements L x W x D 2.7x4.2x1.7 [N/A:N/A] (cm) Area (cm) : [1:8.906] [N/A:N/A] Volume (cm) : [1:15.141] [N/A:N/A] Classification: [1:Grade 1] [N/A:N/A] Exudate Amount: [1:Large] [N/A:N/A] Exudate Type: [1:Purulent] [N/A:N/A] Exudate Color: [1:yellow, brown, green] [N/A:N/A] Wound Margin: [1:Flat and Intact] [N/A:N/A] Granulation Amount: [1:None Present (0%)] [N/A:N/A] Necrotic Amount: [1:Large (67-100%)] [N/A:N/A] Necrotic Tissue: [1:Eschar, Adherent Slough] [N/A:N/A] Exposed Structures: [1:Fat Layer (Subcutaneous Tissue) Exposed: Yes Fascia: No]  [N/A:N/A] Tendon: No Muscle: No Joint: No Bone: No Epithelialization: None N/A N/A Periwound Skin Texture: Excoriation: No N/A N/A Induration: No Callus: No Crepitus: No Rash: No Scarring: No Periwound Skin Maceration: No N/A N/A Moisture: Dry/Scaly: No Periwound Skin Color: Erythema: Yes N/A N/A Atrophie Blanche: No Cyanosis: No Ecchymosis: No Hemosiderin Staining: No Mottled: No Pallor: No Rubor: No Erythema Location: Circumferential N/A N/A Temperature: Hot N/A N/A Tenderness on No N/A N/A Palpation: Wound Preparation: Ulcer Cleansing: N/A N/A Rinsed/Irrigated with Saline Topical Anesthetic Applied: Other: lidocaine 4% Treatment Notes Electronic Signature(s) Signed: 11/23/2016 4:54:09 PM By: Marcus [name] Entered By: Marcus [name] on 11/23/2016 09:20:17 Marcus [name] ([id number]) -------------------------------------------------------------------------------- Multi-Disciplinary Care Plan Details Patient Name: Marcus [name] Date of Service: 11/23/2016 8:00 AM Medical Record Number: [id number] Patient Account Number: [id number] Date of Birth/Sex: May 13, 1955 (61 y.o. Male) Treating RN: Marcus [name] Primary Care Nneka Blanda: Marcus [name] Other Clinician: Referring Sosaia Pittinger: Marcus [name] Treating Idris Edmundson/Extender: Marcus [name], [name] Weeks in Treatment: 0 Active Inactive Electronic Signature(s) Signed: 11/24/2016 11:53:33 AM By: Elliot Gurney, BSN, RN, CWS, Kim RN, BSN Signed: 11/24/2016 5:10:53 PM By: Marcus [name] Previous Signature: 11/23/2016 4:54:09 PM Version By: Marcus [name] Entered By: Elliot Gurney BSN, RN, CWS, Kim on 11/24/2016 11:53:32 Marcus [name], Marcus [name] ([id number]) -------------------------------------------------------------------------------- Pain Assessment Details Patient Name: Marcus [name], Marcus [name] Date of Service: 11/23/2016 8:00 AM Medical Record Number: [id number] Patient Account Number: [id number] Date of Birth/Sex: [birth year] (61 y.o. Male) Treating  RN: Marcus [name] Primary Care Demarious Kapur: Marcus [name] Other Clinician: Referring Makayle Krahn: Marcus [name] Treating Jarell Mcewen/Extender: Marcus [name] Weeks in Treatment: 0 Active Problems Location of Pain Severity and Description of Pain Patient Has Paino No Site Locations Pain Management and Medication Current Pain Management: Notes Topical or injectable lidocaine is offered to patient for acute pain when surgical debridement is performed. If needed, Patient is instructed to use over the counter pain medication for the following 24-48 hours after debridement. Wound care MDs do not prescribed pain medications. Patient has chronic pain or uncontrolled pain. Patient has been instructed to make an appointment with their Primary Care Physician for pain management. Electronic Signature(s) Signed: 11/23/2016 4:54:09 PM By: Marcus [name] Entered By: Marcus [name] on 11/23/2016 08:21:25 Marcus [name] ([id number]) -------------------------------------------------------------------------------- Patient/Caregiver Education Details Patient Name: Marcus [name] Date of Service: 11/23/2016 8:00 AM Medical Record Number: [id number] Patient Account Number: [id number] Date of Birth/Gender: 23-Jun-1955 (61 y.o. Male) Treating RN: Marcus [name] Primary Care Physician: Marcus [name] Other Clinician: Referring Physician: Lindwood [name] Treating Physician/Extender: Skeet Simmer in Treatment: 0 Education Assessment Education Provided To: Caregiver SNF staff via written orders Education Topics Provided Wound/Skin Impairment: Handouts: Other: wound care orders - may change once patient is discharged from [facility] Methods: Clinical cytogeneticist) Signed: 11/24/2016  5:10:53 PM By: Marcus Sitesorthy, Joanna Entered By: Marcus Sitesorthy, Joanna on 11/24/2016 09:59:36 Marcus [name], [name] ([id number]) -------------------------------------------------------------------------------- Wound Assessment  Details Patient Name: Marcus [name], [name] Date of Service: 11/23/2016 8:00 AM Medical Record Number: [id number] Patient Account Number: [id number] Date of Birth/Sex: 1955/10/06 24(61 y.o. Male) Treating RN: Marcus Sitesorthy, Joanna Primary Care Saman Umstead: Marcus QuaHOFFMAN, BYRON Other Clinician: Referring Barbar Brede: Marcus QuaHOFFMAN, BYRON Treating Karianna Gusman/Extender: Marcus [name] Weeks in Treatment: 0 Wound Status Wound Number: 1 Primary Diabetic Wound/Ulcer of the Lower Etiology: Extremity Wound Location: Left Foot - Lateral Wound Open Wounding Event: Not Known Status: Date Acquired: 11/20/2016 Comorbid Chronic Obstructive Pulmonary Weeks Of Treatment: 0 History: Disease (COPD), Hypertension, Type I Clustered Wound: No Diabetes, Neuropathy Pending Amputation On Presentation Photos Photo Uploaded By: Marcus Sitesorthy, Joanna on 11/23/2016 11:52:23 Wound Measurements Length: (cm) 2.7 Width: (cm) 4.2 Depth: (cm) 1.7 Area: (cm) 8.906 Volume: (cm) 15.141 % Reduction in Area: % Reduction in Volume: Epithelialization: None Tunneling: No Undermining: No Wound Description Classification: Grade 1 Wound Margin: Flat and Intact Exudate Amount: Large Exudate Type: Purulent Exudate Color: yellow, brown, green Foul Odor After Cleansing: No Slough/Fibrino Yes Wound Bed Granulation Amount: None Present (0%) Exposed Structure Necrotic Amount: Large (67-100%) Fascia Exposed: No Necrotic Quality: Eschar, Adherent Slough Fat Layer (Subcutaneous Tissue) Exposed: Yes Marcus [name], [name] ([id number]) Tendon Exposed: No Muscle Exposed: No Joint Exposed: No Bone Exposed: No Periwound Skin Texture Texture Color No Abnormalities Noted: No No Abnormalities Noted: No Callus: No Atrophie Blanche: No Crepitus: No Cyanosis: No Excoriation: No Ecchymosis: No Induration: No Erythema: Yes Rash: No Erythema Location: Circumferential Scarring: No Hemosiderin Staining: No Mottled: No Moisture Pallor: No No Abnormalities  Noted: No Rubor: No Dry / Scaly: No Maceration: No Temperature / Pain Temperature: Hot Wound Preparation Ulcer Cleansing: Rinsed/Irrigated with Saline Topical Anesthetic Applied: Other: lidocaine 4%, Electronic Signature(s) Signed: 11/23/2016 4:54:09 PM By: Marcus Sitesorthy, Joanna Entered By: Marcus Sitesorthy, Joanna on 11/23/2016 08:50:45 Marcus [name], [name] ([id number]) -------------------------------------------------------------------------------- Vitals Details Patient Name: Marcus [name], Clearence O. Date of Service: 11/23/2016 8:00 AM Medical Record Number: [id number] Patient Account Number: [id number] Date of Birth/Sex: 1955/10/06 29(61 y.o. Male) Treating RN: Marcus Sitesorthy, Joanna Primary Care Vincenta Steffey: Marcus QuaHOFFMAN, BYRON Other Clinician: Referring Antino Mayabb: Marcus QuaHOFFMAN, BYRON Treating Romel Dumond/Extender: Marcus [name] Weeks in Treatment: 0 Vital Signs Time Taken: 08:21 Temperature (F): 98.5 Height (in): 74 Pulse (bpm): 70 Source: Measured Respiratory Rate (breaths/min): 18 Weight (lbs): 152 Blood Pressure (mmHg): 120/59 Source: Measured Reference Range: 80 - 120 mg / dl Body Mass Index (BMI): 19.5 Electronic Signature(s) Signed: 11/23/2016 4:54:09 PM By: Marcus Sitesorthy, Joanna Entered By: Marcus Sitesorthy, Joanna on 11/23/2016 08:22:05

## 2016-11-26 NOTE — Plan of Care (Signed)
Problem: Education: Goal: Knowledge of [facility] Education information/materials will improve Outcome: Not Progressing Patient needs assistance.   Problem: Health Behavior/Discharge Planning: Goal: Ability to manage health-related needs will improve Outcome: Not Progressing Patient needs assistance.   

## 2016-11-26 NOTE — Progress Notes (Signed)
2 Days Post-Op   Subjective/Chief Complaint: Asian seen. Does not really complain of any pain.   Objective: Vital signs in last 24 hours: Temp:  [98 F (36.7 C)-100.7 F (38.2 C)] 98 F (36.7 C) (08/12 0810) Pulse Rate:  [66-73] 66 (08/12 0810) Resp:  [16-18] 16 (08/12 0810) BP: (107-129)/(52-60) 119/52 (08/12 0810) SpO2:  [97 %-100 %] 100 % (08/12 0810) Last BM Date: 11/26/16  Intake/Output from previous day: No intake/output data recorded. Intake/Output this shift: No intake/output data recorded.  Bandages dry and intact. Upon removal there is moderate to heavy bleeding but no significant signs of purulence on the bandaging. The incision is well coapted with significant reduction in erythema and edema.  Lab Results:   Recent Labs  11/24/16 0500 11/25/16 0416  WBC 4.0 5.6  HGB 11.3* 11.7*  HCT 34.5* 36.4*  PLT 180 192   BMET  Recent Labs  11/24/16 0500 11/25/16 0416  NA 138 138  K 3.3* 3.5  CL 100* 99*  CO2 29 30  GLUCOSE 71 141*  BUN 19 10  CREATININE 3.50* 2.58*  CALCIUM 8.1* 7.8*   PT/INR No results for input(s): LABPROT, INR in the last 72 hours. ABG No results for input(s): PHART, HCO3 in the last 72 hours.  Invalid input(s): PCO2, PO2  Studies/Results: No results found.  Anti-infectives: Anti-infectives    Start     Dose/Rate Route Frequency Ordered Stop   11/24/16 1200  vancomycin (VANCOCIN) 500 mg in sodium chloride 0.9 % 100 mL IVPB     500 mg 100 mL/hr over 60 Minutes Intravenous Every M-W-F (Hemodialysis) 11/23/16 1545     11/24/16 1000  ceFEPIme (MAXIPIME) 1 g in dextrose 5 % 50 mL IVPB     1 g 100 mL/hr over 30 Minutes Intravenous Every 24 hours 11/23/16 1545     11/23/16 1600  vancomycin (VANCOCIN) 500 mg in sodium chloride 0.9 % 100 mL IVPB     500 mg 100 mL/hr over 60 Minutes Intravenous  Once 11/23/16 1545 11/23/16 1835   11/23/16 1400  ceFEPIme (MAXIPIME) 1 g in dextrose 5 % 50 mL IVPB     1 g 100 mL/hr over 30 Minutes  Intravenous  Once 11/23/16 1348 11/23/16 1905   11/23/16 1200  vancomycin (VANCOCIN) IVPB 1000 mg/200 mL premix     1,000 mg 200 mL/hr over 60 Minutes Intravenous  Once 11/23/16 1151 11/23/16 1256      Assessment/Plan: s/p Procedure(s): RAY RESECTION-LEFT 5TH RAY (Left) Assessment: Osteomyelitis, stable status post debridement and ray resection.   Plan: Betadine and a sterile bandage applied. Patient should be stable for discharge pending culture results and appropriate antibiotics. Will need either skilled nursing or home health care for dressing change and wound evaluation 3 times a week initially. Plan for follow-up in a couple of weeks outpatient  LOS: 3 days    [name] W Treonna Klee 11/26/2016

## 2016-11-26 NOTE — Progress Notes (Signed)
[facility]  ROUNDING NOTE   Subjective:  Patient last had dialysis on Monday. Resting comfortably in bed at the moment. Left foot wrapped.   Objective:  Vital signs in last 24 hours:  Temp:  [98 F (36.7 C)-100.7 F (38.2 C)] 98 F (36.7 C) (08/12 0810) Pulse Rate:  [66-73] 66 (08/12 0810) Resp:  [16-18] 16 (08/12 0810) BP: (107-129)/(52-60) 119/52 (08/12 0810) SpO2:  [97 %-100 %] 100 % (08/12 0810)  Weight change:  Filed Weights   11/23/16 1030 11/23/16 1721 11/24/16 0950  Weight: 59.4 kg (131 lb) 56.9 kg (125 lb 6.4 oz) 57 kg (125 lb 10.6 oz)    Intake/Output: I/O last 3 completed shifts: In: 100 [I.V.:100] Out: 3 [Blood:3]   Intake/Output this shift:  No intake/output data recorded.  Physical Exam: General: Chronically ill appearing  Head: Normocephalic, atraumatic. Moist oral mucosal membranes  Eyes: Anicteric  Neck: Supple, trachea midline  Lungs:  Clear to auscultation, normal effort  Heart: S1S2 no rubs  Abdomen:  Soft, nontender, bowel sounds present  Extremities: trace peripheral edema, Left foot wrapped   Neurologic: Awake, alert, following commands  Skin: No lesions  Access: Left femoral permcath    Basic Metabolic Panel:  Recent Labs Lab 11/23/16 1044 11/24/16 0500 11/24/16 1007 11/25/16 0416  NA 136 138  --  138  K 3.9 3.3*  --  3.5  CL 98* 100*  --  99*  CO2 29 29  --  30  GLUCOSE 214* 71  --  141*  BUN 13 19  --  10  CREATININE 3.09* 3.50*  --  2.58*  CALCIUM 8.0* 8.1*  --  7.8*  PHOS  --   --  <1.0*  --     Liver Function Tests:  Recent Labs Lab 11/23/16 1044  AST 172*  ALT 41  ALKPHOS 138*  BILITOT 0.6  PROT 7.4  ALBUMIN 2.8*   No results for input(s): LIPASE, AMYLASE in the last 168 hours. No results for input(s): AMMONIA in the last 168 hours.  CBC:  Recent Labs Lab 11/23/16 1044 11/24/16 0500 11/25/16 0416  WBC 3.5* 4.0 5.6  NEUTROABS 2.3  --   --   HGB 11.8* 11.3* 11.7*  HCT 36.4* 34.5*  36.4*  MCV 91.4 90.9 90.4  PLT 185 180 192    Cardiac Enzymes: No results for input(s): CKTOTAL, CKMB, CKMBINDEX, TROPONINI in the last 168 hours.  BNP: Invalid input(s): POCBNP  CBG:  Recent Labs Lab 11/25/16 1154 11/25/16 1356 11/25/16 1656 11/25/16 2131 11/26/16 0738  GLUCAP 334* 350* 186* 87 290*    Microbiology: Results for orders placed or performed during the hospital encounter of 11/23/16  MRSA PCR Screening     Status: Abnormal   Collection Time: 11/23/16  6:37 PM  Result Value Ref Range Status   MRSA by PCR POSITIVE (A) NEGATIVE Final    Comment:        The GeneXpert MRSA Assay (FDA approved for NASAL specimens only), is one component of a comprehensive MRSA colonization surveillance program. It is not intended to diagnose MRSA infection nor to guide or monitor treatment for MRSA infections. RESULT CALLED TO, READ BACK BY AND VERIFIED WITH: STACEY CLAY 11/23/16 @ 2054  MLK   Aerobic Culture (superficial specimen)     Status: None (Preliminary result)   Collection Time: 11/24/16  7:19 PM  Result Value Ref Range Status   Specimen Description BONE LEFT FOOT 5TH RAY  Final   Special Requests  NONE  Final   Gram Stain   Final    NO WBC SEEN NO ORGANISMS SEEN Performed at [facility], [address] [place], [place] [address]    Culture PENDING  Incomplete   Report Status PENDING  Incomplete    Coagulation Studies: No results for input(s): LABPROT, INR in the last 72 hours.  Urinalysis: No results for input(s): COLORURINE, LABSPEC, PHURINE, GLUCOSEU, HGBUR, BILIRUBINUR, KETONESUR, PROTEINUR, UROBILINOGEN, NITRITE, LEUKOCYTESUR in the last 72 hours.  Invalid input(s): APPERANCEUR    Imaging: No results found.   Medications:   . ceFEPime (MAXIPIME) IV Stopped (11/25/16 1103)  . vancomycin Stopped (11/24/16 1327)   . amLODipine  10 mg Oral QHS  . aspirin  81 mg Oral Daily  . B-complex with vitamin C  1 tablet Oral Daily  . carvedilol   25 mg Oral BID WC  . Chlorhexidine Gluconate Cloth  6 each Topical Q0600  . docusate sodium  100 mg Oral BID  . feeding supplement (NEPRO CARB STEADY)  237 mL Oral TID WC  . FLUoxetine  20 mg Oral Daily  . insulin aspart  0-9 Units Subcutaneous TID WC  . insulin glargine  15 Units Subcutaneous Daily  . levothyroxine  275 mcg Oral QAC breakfast  . loratadine  10 mg Oral Daily  . Melatonin  5 mg Oral QHS  . mirtazapine  15 mg Oral QHS  . mupirocin ointment  1 application Nasal BID  . polyethylene glycol  17 g Oral Daily  . pravastatin  10 mg Oral QHS  . saccharomyces boulardii  250 mg Oral Daily  . traZODone  100 mg Oral QHS   acetaminophen **OR** acetaminophen, albuterol, HYDROcodone-acetaminophen, ondansetron, oxyCODONE-acetaminophen, promethazine  Assessment/ Plan:  61 y.o. male with a PMHx of ESRD on HD MWF, diabetes mellitus type 1, COPD, tobacco abuse, hypertension, anemia of chronic kidney disease, secondary hyperparathyroidism, history of CVA, prior episodes of DKA, who was [facility]2016 for evaluation of left fifth toe infection, s/p amputation left fifth toe 11/24/16.  MWF/[place]/L femoral permcath  1.  ESRD on HD  MWF.   patient last had dialysis on Friday. We will plan for dialysis again tomorrow.  2. anemia chronic kidney disease.   hemoglobin currently acceptable at 11.7. Hold off on Epogen.  3.  Secondary hyperparathyroidism. Serum phosphorus was quite low at less than 1.0. We will repeat serum phosphorus tomorrow with dialysis. Patient was given IV phosphorus on Friday.  4.  Hypertension.   Blood pressure under good control at 119/52. Continue the patient carvedilol and amlodipine.  5.  Left foot osteomyelitis. Patient status post left fifth toe amputation on 11/24/2016. Continue the patient on cefepime and vancomycin.   LOS: 3 Matyas Baisley 8/12/201810:13 AM

## 2016-11-26 NOTE — Progress Notes (Signed)
   11/26/16 1304  PT Visit Information  Last PT Received On 11/26/16  Reason Eval/Treat Not Completed Fatigue/lethargy limiting ability to participate;Other (comment) (patient refused x2 today; reports feeling too tired and not wanting to get out of bed; RN informed; will re-attempt PT evaluation when patient is ready and willing to participate. )  Milus MallickMargaret E. Baron Hamperrotter, PT, DPT

## 2016-11-27 ENCOUNTER — Encounter: Payer: Self-pay | Admitting: Podiatry

## 2016-11-27 LAB — CBC
HCT: 34.6 % — ABNORMAL LOW (ref 40.0–52.0)
Hemoglobin: 11 g/dL — ABNORMAL LOW (ref 13.0–18.0)
MCH: 28.9 pg (ref 26.0–34.0)
MCHC: 32 g/dL (ref 32.0–36.0)
MCV: 90.3 fL (ref 80.0–100.0)
Platelets: 202 10*3/uL (ref 150–440)
RBC: 3.83 MIL/uL — ABNORMAL LOW (ref 4.40–5.90)
RDW: 17.1 % — AB (ref 11.5–14.5)
WBC: 8.1 10*3/uL (ref 3.8–10.6)

## 2016-11-27 LAB — RENAL FUNCTION PANEL
ANION GAP: 8 (ref 5–15)
Albumin: 2.7 g/dL — ABNORMAL LOW (ref 3.5–5.0)
BUN: 35 mg/dL — ABNORMAL HIGH (ref 6–20)
CALCIUM: 8.4 mg/dL — AB (ref 8.9–10.3)
CHLORIDE: 95 mmol/L — AB (ref 101–111)
CO2: 31 mmol/L (ref 22–32)
CREATININE: 4.33 mg/dL — AB (ref 0.61–1.24)
GFR, EST AFRICAN AMERICAN: 16 mL/min — AB (ref >60)
GFR, EST NON AFRICAN AMERICAN: 13 mL/min — AB (ref >60)
Glucose, Bld: 28 mg/dL — CL (ref 65–99)
Phosphorus: 1.7 mg/dL — ABNORMAL LOW (ref 2.5–4.6)
Potassium: 4.4 mmol/L (ref 3.5–5.1)
SODIUM: 134 mmol/L — AB (ref 135–145)

## 2016-11-27 LAB — GLUCOSE, CAPILLARY
GLUCOSE-CAPILLARY: 137 mg/dL — AB (ref 65–99)
GLUCOSE-CAPILLARY: 24 mg/dL — AB (ref 65–99)
GLUCOSE-CAPILLARY: 35 mg/dL — AB (ref 65–99)
GLUCOSE-CAPILLARY: 494 mg/dL — AB (ref 65–99)
GLUCOSE-CAPILLARY: 70 mg/dL (ref 65–99)
GLUCOSE-CAPILLARY: 82 mg/dL (ref 65–99)
GLUCOSE-CAPILLARY: 97 mg/dL (ref 65–99)
Glucose-Capillary: 162 mg/dL — ABNORMAL HIGH (ref 65–99)
Glucose-Capillary: 182 mg/dL — ABNORMAL HIGH (ref 65–99)
Glucose-Capillary: 451 mg/dL — ABNORMAL HIGH (ref 65–99)
Glucose-Capillary: 515 mg/dL (ref 65–99)

## 2016-11-27 MED ORDER — INSULIN ASPART 100 UNIT/ML ~~LOC~~ SOLN
9.0000 [IU] | Freq: Once | SUBCUTANEOUS | Status: AC
  Administered 2016-11-27: 9 [IU] via SUBCUTANEOUS
  Filled 2016-11-27: qty 1

## 2016-11-27 MED ORDER — CEFAZOLIN SODIUM-DEXTROSE 2-4 GM/100ML-% IV SOLN
2.0000 g | INTRAVENOUS | Status: DC
  Administered 2016-11-27 – 2016-11-29 (×2): 2 g via INTRAVENOUS
  Filled 2016-11-27 (×2): qty 100

## 2016-11-27 MED ORDER — DEXTROSE 5 % IV SOLN: 3.0000 g | INTRAVENOUS | Status: DC

## 2016-11-27 MED ORDER — INSULIN GLARGINE 100 UNIT/ML ~~LOC~~ SOLN: 9.0000 [IU] | Freq: Every day | SUBCUTANEOUS | Status: DC

## 2016-11-27 MED ORDER — DEXTROSE 50 % IV SOLN
1.0000 | Freq: Once | INTRAVENOUS | Status: AC
  Administered 2016-11-27: 50 mL via INTRAVENOUS

## 2016-11-27 MED ORDER — DEXTROSE 50 % IV SOLN
INTRAVENOUS | Status: AC
  Filled 2016-11-27: qty 50

## 2016-11-27 MED ORDER — HEPARIN SODIUM (PORCINE) 5000 UNIT/ML IJ SOLN
5000.0000 [IU] | Freq: Three times a day (TID) | INTRAMUSCULAR | Status: DC
  Administered 2016-11-27 – 2016-11-30 (×6): 5000 [IU] via SUBCUTANEOUS
  Filled 2016-11-27 (×7): qty 1

## 2016-11-27 NOTE — Progress Notes (Addendum)
Inpatient Diabetes Program Recommendations  AACE/ADA: New Consensus Statement on Inpatient Glycemic Control (2015)  Target Ranges:  Prepandial:   less than 140 mg/dL      Peak postprandial:   less than 180 mg/dL (1-2 hours)      Critically ill patients:  140 - 180 mg/dL   Results for Marcus [name], Marcus [name] (MRN [id number]) as of 11/27/2016 07:34  Ref. Range 11/26/2016 07:38 11/26/2016 11:52 11/26/2016 16:28 11/26/2016 22:02  Glucose-Capillary Latest Ref Range: 65 - 99 mg/dL 366290 (H) 440213 (H) 347110 (H) 122 (H)   Results for Marcus [name], Marcus [name] (MRN [id number]) as of 11/27/2016 07:34  Ref. Range 11/27/2016 05:36  Glucose-Capillary Latest Ref Range: 65 - 99 mg/dL 24 (LL)    Diabetes history: Type 1 since age 61- [facility] (recent visit on 11/16/16)  Outpatient DM meds: Lantus 5 units qhs               Novolog 1-5 units tid with meals (based on pre-meal blood sugar and amount to be eaten)  Current Insulin Orders: Novolog Sensitive Correction Scale/ SSI (0-9 units) TID AC      Lantus 15 units daily       MD- Patient has Type 1 diabetes and makes NO insulin.  [facility].  At that visit, pt was instructed to take Lantus 5 units QHS and to continue Novolog at home.  Note patient was given 15 units Lantus yesterday morning.  CBG down to 24 mg/dl this AM.  Please consider the following insulin adjustments:  1. Reduce Lantus dose to 5 units daily (start this AM)  2. Start low dose Novolog Meal Coverage: Novolog 2 units TID with meals (hold if pt eats <50% of meal)     --Will follow patient during hospitalization--  Ambrose FinlandJeannine Johnston Roen Macgowan RN, MSN, CDE Diabetes Coordinator Inpatient Glycemic Control Team Team Pager: [phone] (8a-5p)

## 2016-11-27 NOTE — Progress Notes (Signed)
Pre HD  

## 2016-11-27 NOTE — Progress Notes (Signed)
Pre hd 

## 2016-11-27 NOTE — Progress Notes (Signed)
HD completed without issue. Total UF 1L. Patient tolerated well. Received vancomycin with treatment.

## 2016-11-27 NOTE — Progress Notes (Signed)
Infectious Disease Long Term IV Antibiotic Orders  Diagnosis: DM foot ulcer and osteomyelitis   Culture results Results for orders placed or performed during the hospital encounter of 11/23/16  MRSA PCR Screening     Status: Abnormal   Collection Time: 11/23/16  6:37 PM  Result Value Ref Range Status   MRSA by PCR POSITIVE (A) NEGATIVE Final    Comment:        The GeneXpert MRSA Assay (FDA approved for NASAL specimens only), is one component of a comprehensive MRSA colonization surveillance program. It is not intended to diagnose MRSA infection nor to guide or monitor treatment for MRSA infections. RESULT CALLED TO, READ BACK BY AND VERIFIED WITH: STACEY CLAY 11/23/16 @ 2054  MLK   Anaerobic culture     Status: None (Preliminary result)   Collection Time: 11/24/16  7:19 PM  Result Value Ref Range Status   Specimen Description BONE LEFT FOOT 5TH RAY  Final   Special Requests NONE  Final   Culture   Final    CULTURE REINCUBATED FOR BETTER GROWTH Performed at [facility], [address] [place], [place] [address]    Report Status PENDING  Incomplete  Aerobic Culture (superficial specimen)     Status: None (Preliminary result)   Collection Time: 11/24/16  7:19 PM  Result Value Ref Range Status   Specimen Description BONE LEFT FOOT 5TH RAY  Final   Special Requests NONE  Final   Gram Stain NO WBC SEEN NO ORGANISMS SEEN   Final   Culture   Final    FEW PROTEUS MIRABILIS CULTURE REINCUBATED FOR BETTER GROWTH Performed at [facility], [address] [place], [place] [address]    Report Status PENDING  Incomplete   Organism ID, Bacteria PROTEUS MIRABILIS  Final      Susceptibility   Proteus mirabilis - MIC*    AMPICILLIN <=2 SENSITIVE Sensitive     CEFAZOLIN <=4 SENSITIVE Sensitive     CEFEPIME <=1 SENSITIVE Sensitive     CEFTAZIDIME <=1 SENSITIVE Sensitive     CEFTRIAXONE <=1 SENSITIVE Sensitive     CIPROFLOXACIN <=0.25 SENSITIVE Sensitive     GENTAMICIN  <=1 SENSITIVE Sensitive     IMIPENEM 1 SENSITIVE Sensitive     TRIMETH/SULFA <=20 SENSITIVE Sensitive     AMPICILLIN/SULBACTAM <=2 SENSITIVE Sensitive     PIP/TAZO <=4 SENSITIVE Sensitive     * FEW PROTEUS MIRABILIS     Allergies:  Allergies  Allergen Reactions  . Penicillins Other (See Comments)    Unknown reaction    Discharge antibiotics Cefazolin    2  grams every  HD session MW, but 3 gm on Friday HD session  PICC Care per protocol Labs weekly while on IV antibiotics      CBC w diff    Planned duration of antibiotics 4 weeks from 8/10    Stop date  12/22/16  Follow up clinic date Can fu with podiatry I can see if needed  FAX weekly labs to [phone]  Mick Sellavid P Fitzgerald, MD

## 2016-11-27 NOTE — Progress Notes (Signed)
3 Days Post-Op   Subjective/Chief Complaint: Patient seen. No complaints of pain.   Objective: Vital signs in last 24 hours: Temp:  [99.1 F (37.3 C)] 99.1 F (37.3 C) (08/12 2203) Pulse Rate:  [62-73] 62 (08/13 0540) Resp:  [17-18] 17 (08/13 0540) BP: (99-112)/(47-50) 99/50 (08/13 0540) SpO2:  [94 %-100 %] 94 % (08/13 0540) Last BM Date: 11/26/16  Intake/Output from previous day: No intake/output data recorded. Intake/Output this shift: No intake/output data recorded.  Dressing on the left foot is dry and intact. No evidence of any strikethrough.  Lab Results:   Recent Labs  11/25/16 0416  WBC 5.6  HGB 11.7*  HCT 36.4*  PLT 192   BMET  Recent Labs  11/25/16 0416 11/27/16 0415  NA 138 134*  K 3.5 4.4  CL 99* 95*  CO2 30 31  GLUCOSE 141* 28*  BUN 10 35*  CREATININE 2.58* 4.33*  CALCIUM 7.8* 8.4*   PT/INR No results for input(s): LABPROT, INR in the last 72 hours. ABG No results for input(s): PHART, HCO3 in the last 72 hours.  Invalid input(s): PCO2, PO2  Studies/Results: No results found.  Anti-infectives: Anti-infectives    Start     Dose/Rate Route Frequency Ordered Stop   11/24/16 1200  vancomycin (VANCOCIN) 500 mg in sodium chloride 0.9 % 100 mL IVPB     500 mg 100 mL/hr over 60 Minutes Intravenous Every M-W-F (Hemodialysis) 11/23/16 1545     11/24/16 1000  ceFEPIme (MAXIPIME) 1 g in dextrose 5 % 50 mL IVPB     1 g 100 mL/hr over 30 Minutes Intravenous Every 24 hours 11/23/16 1545     11/23/16 1600  vancomycin (VANCOCIN) 500 mg in sodium chloride 0.9 % 100 mL IVPB     500 mg 100 mL/hr over 60 Minutes Intravenous  Once 11/23/16 1545 11/23/16 1835   11/23/16 1400  ceFEPIme (MAXIPIME) 1 g in dextrose 5 % 50 mL IVPB     1 g 100 mL/hr over 30 Minutes Intravenous  Once 11/23/16 1348 11/23/16 1905   11/23/16 1200  vancomycin (VANCOCIN) IVPB 1000 mg/200 mL premix     1,000 mg 200 mL/hr over 60 Minutes Intravenous  Once 11/23/16 1151 11/23/16 1256       Assessment/Plan: s/p Procedure(s): RAY RESECTION-LEFT 5TH RAY (Left) Assessment: Stable status post fifth ray resection left foot   Plan: Dressing left intact. We'll changed tomorrow if still here. Patient should be stable for discharge on appropriate antibiotics. Plan for follow-up in 1-2 weeks  LOS: 4 days    [name] W Maliea Grandmaison 11/27/2016

## 2016-11-27 NOTE — Care Management (Signed)
HD information e faxed to Amanda Morris HD liaison  

## 2016-11-27 NOTE — Consult Note (Signed)
Pharmacy Antibiotic Note  Marcus [name] is a 61 y.o. male admitted on 11/23/2016 with diabetic foot infection.  Pharmacy has been consulted for vancomycin and cefepime dosing. Pt is an ESRD on HD MWF  Plan: Will discuss possible need for addition of metronidazole to this regiment with MD.  Vancomycin 500 mg IV qMWF  Level prior to the 3rd HD session, goal 15-25 mcg/ml Cefepime 1g q 24hr  8/13: Per Dr. Sampson GoonFitzgerald: d/c cefepime and start Cefazolin. See ID note.  Will order Cefazolin 2 gram IV after Hemodialysis on Mondays and Wednesdays and Cefazolin 3 gram after Hemodialysis on Fridays.    Height: 5\' 9"  (175.3 cm) Weight:  (bed scale broken) IBW/kg (Calculated) : 70.7  Temp (24hrs), Avg:99.1 F (37.3 C), Min:99 F (37.2 C), Max:99.1 F (37.3 C)   Recent Labs Lab 11/23/16 1044 11/24/16 0500 11/25/16 0416 11/27/16 0415 11/27/16 1404  WBC 3.5* 4.0 5.6  --  8.1  CREATININE 3.09* 3.50* 2.58* 4.33*  --     Estimated Creatinine Clearance: 14.4 mL/min (A) (by C-G formula based on SCr of 4.33 mg/dL (H)).    Allergies  Allergen Reactions  . Penicillins Other (See Comments)    Unknown reaction    Antimicrobials this admission: vancomycin 8/9 >>  zosyn 8/9 >>  Cefepime  >> 8/13 Cefazolin 8/13 >>  Dose adjustments this admission:   Microbiology results: MRSA PCR: positive Wound cx: Proteus mirabilis  Thank you for allowing pharmacy to be a part of this patient's care.  Luisa HartScott Christy, PharmD Clinical Pharmacist  11/27/2016 5:15 PM

## 2016-11-27 NOTE — Progress Notes (Signed)
Post hd Assessment unchanged

## 2016-11-27 NOTE — Consult Note (Signed)
[facility] Infectious Disease     Reason for Consult DM foot infection    Referring Physician: Ulysees Barns Date of Admission:  11/23/2016   Active Problems:   Osteomyelitis ([place])   Pressure injury of skin   HPI: Marcus [name] is a 61 y.o. male admitted from NH with with DM Foot ulcer and Osteomyelitis. He is a poor historian and cannot tell me the course of the infection.  S/p surgery 8/10 with amputation of L fifth toe and partial ray resection with abx bead placement. Cx with Proteus.    Past Medical History:  Diagnosis Date  . Cataract   . CHF (congestive heart failure) ([place])   . Chronic pain   . Cocaine abuse    past history  . COPD (chronic obstructive pulmonary disease) ([place])   . CVA (cerebrovascular accident) ([place])    hx CVA x 2  . Depression   . Diabetes mellitus ([place])    Sees Dr. Elayne Snare [phone] -- fasting blood 70-110s  . DKA (diabetic ketoacidosis) ([place])    severe, Dec 2011  . DVT (deep venous thrombosis) ([place])   . ESRD (end stage renal disease) on dialysis (HCC)    hd - mwf - [place]  . GERD (gastroesophageal reflux disease)   . GI bleed    2006, mallory-weiss tear  . Goiter    by CT Apr 2013  . Hemodialysis patient ([place])    M, W, F  . History of blood transfusion   . HTN (hypertension)    sees Dr. Jannette Fogo, [place] Alert  . Hyperlipidemia   . Hypothyroidism   . Insomnia   . Neuromuscular disorder ([place])    diabetic neuropathy  . Occlusion of carotid stent [facility])    Per patients nursing aid  . On home oxygen therapy    2l nasal cannula at night  . Pneumonia    hx of  . Pulmonary edema    nov 2011, dry wt decreased  . Tobacco abuse    history of   Past Surgical History:  Procedure Laterality Date  . AMPUTATION Left 11/24/2016   Procedure: RAY RESECTION-LEFT 5TH RAY;  Surgeon: Sharlotte Alamo, DPM;  Location: [facility] OR[facility];  Service: Podiatry;  Laterality: Left;  . ARTERIOVENOUS GRAFT PLACEMENT Left    thigh  . AV FISTULA PLACEMENT  03/25/2008    right forearm  . AV FISTULA PLACEMENT  04/23/2012   Procedure: INSERTION OF ARTERIOVENOUS (AV) GORE-TEX GRAFT THIGH;  Surgeon: Elam Dutch, MD;  Location: [place];  Service: Vascular;  Laterality: Left;  . AV FISTULA PLACEMENT Left 03/14/2013   Procedure: INSERTION OF  ARTERIOVENOUS (AV) GORE-TEX GRAFT  LEFT THIGH;  Surgeon: Rosetta Posner, MD;  Location: [place];  Service: Vascular;  Laterality: Left;  . AV FISTULA PLACEMENT Right 12/30/2013   Procedure: INSERTION OF ARTERIOVENOUS (AV) GORE-TEX GRAFT THIGH;  Surgeon: Conrad Nanuet, MD;  Location: [place];  Service: Vascular;  Laterality: Right;  . [place] REMOVAL Left 04/16/2013   Procedure: REMOVAL OF ARTERIOVENOUS GORETEX GRAFT Left Thigh;  Surgeon: Serafina Mitchell, MD;  Location: [place];  Service: Vascular;  Laterality: Left;  . CAROTID STENT INSERTION Right   . COLONOSCOPY  09/16/13  . dyalisis catheter    . EYE SURGERY Bilateral    cataracts  . INSERTION OF DIALYSIS CATHETER Right 03/03/2013   Procedure: INSERTION OF DIALYSIS CATHETER;  Surgeon: Conrad Tilleda, MD;  Location: [place];  Service: Vascular;  Laterality: Right;  . INSERTION  OF DIALYSIS CATHETER Left 09/18/2013   Procedure: INSERTION OF DIALYSIS CATHETER-LEFT NECK VS LEFT THIGH;  Surgeon: Mal Misty, MD;  Location: [place];  Service: Vascular;  Laterality: Left;  . LIGATION ARTERIOVENOUS GORTEX GRAFT Right 02/27/2013   Procedure: LIGATION ARTERIOVENOUS GORTEX GRAFT;  Surgeon: Angelia Mould, MD;  Location: [place];  Service: Vascular;  Laterality: Right;  . LIGATION ARTERIOVENOUS GORTEX GRAFT Left 03/03/2013   Procedure: LIGATION Left ARTERIOVENOUS GORTEX Thigh GRAFT;  Surgeon: Conrad Carbon, MD;  Location: [place];  Service: Vascular;  Laterality: Left;  . LYMPH NODE BIOPSY Right 01/28/2013   Procedure: LYMPH NODE BIOPSY;  Surgeon: Earnstine Regal, MD;  Location: [place];  Service: General;  Laterality: Right;  . MASS BIOPSY N/A 01/28/2013   Procedure: NECK EXPLORATION;  Surgeon: Earnstine Regal, MD;  Location: [place];  Service: General;  Laterality: N/A;  . PATCH ANGIOPLASTY Left 04/16/2013   Procedure: Patch Angioplasty of left femoral artery;  Surgeon: Serafina Mitchell, MD;  Location: [place];  Service: Vascular;  Laterality: Left;  . REMOVAL OF A DIALYSIS CATHETER Right 09/18/2013   Procedure: REMOVAL OF A DIALYSIS CATHETER-RIGHT FEMORAL VEIN;  Surgeon: Mal Misty, MD;  Location: [place];  Service: Vascular;  Laterality: Right;  . SHUNTOGRAM Right 03/28/2012   Procedure: SHUNTOGRAM;  Surgeon: Conrad , MD;  Location: [facility] CATH LAB;  Service: Cardiovascular;  Laterality: Right;  . THYROID SURGERY  01/28/2013   Dr Harlow Asa   Social History  Substance Use Topics  . Smoking status: Current Every Day Smoker    Packs/day: 0.25    Types: Cigarettes  . Smokeless tobacco: Never Used     Comment: pt states that he has been thinking about giving it up  . Alcohol use No   Family History  Problem Relation Age of Onset  . Diabetes Sister   . Diabetes Brother   . Hypertension Brother   . Heart disease Brother   . Hypertension Mother   . Heart attack Mother   . Hyperlipidemia Mother   . Heart disease Mother        before age 62  . Hypertension Father     Allergies:  Allergies  Allergen Reactions  . Penicillins Other (See Comments)    Unknown reaction    Current antibiotics: Antibiotics Given (last 72 hours)    Date/Time Action Medication Dose Rate   11/25/16 1033 New Bag/Given   ceFEPIme (MAXIPIME) 1 g in dextrose 5 % 50 mL IVPB 1 g 100 mL/hr   11/26/16 1050 New Bag/Given   ceFEPIme (MAXIPIME) 1 g in dextrose 5 % 50 mL IVPB 1 g 100 mL/hr      MEDICATIONS: . aspirin  81 mg Oral Daily  . B-complex with vitamin C  1 tablet Oral Daily  . Chlorhexidine Gluconate Cloth  6 each Topical Q0600  . dextrose      . dextrose      . docusate sodium  100 mg Oral BID  . feeding supplement (NEPRO CARB STEADY)  237 mL Oral TID WC  . FLUoxetine  20 mg Oral Daily  . heparin  subcutaneous  5,000 Units Subcutaneous Q8H  . insulin aspart  0-9 Units Subcutaneous TID WC  . levothyroxine  275 mcg Oral QAC breakfast  . loratadine  10 mg Oral Daily  . Melatonin  5 mg Oral QHS  . mirtazapine  15 mg Oral QHS  . mupirocin ointment  1 application Nasal BID  .  polyethylene glycol  17 g Oral Daily  . pravastatin  10 mg Oral QHS  . saccharomyces boulardii  250 mg Oral Daily  . traZODone  100 mg Oral QHS    Review of Systems - unable to obtain   OBJECTIVE: Temp:  [99 F (37.2 C)-99.1 F (37.3 C)] 99 F (37.2 C) (08/13 1355) Pulse Rate:  [61-73] 69 (08/13 1600) Resp:  [10-18] 13 (08/13 1600) BP: (99-122)/(47-71) 121/66 (08/13 1600) SpO2:  [94 %-100 %] 96 % (08/13 1355) Physical Exam  Constitutional: disheveled, disoriented,  HENT: anicteric,  Mouth/Throat: Oropharynx is clear and moist. No oropharyngeal exudate.  Cardiovascular: Normal rate, regular rhythm and normal heart sounds.  Pulmonary/Chest: Effort normal and breath sounds normal. No respiratory distress. He has no wheezes.  Abdominal: Soft. Bowel sounds are normal. He exhibits no distension. There is no tenderness.  Lymphadenopathy:  He has no cervical adenopathy.  Neurological: disoriented  Skin: L foot wrapped post op, no edema  Psychiatric: He has a normal mood and affect. His behavior is normal.     LABS: Results for orders placed or performed during the hospital encounter of 11/23/16 (from the past 48 hour(s))  Glucose, capillary     Status: Abnormal   Collection Time: 11/25/16  4:56 PM  Result Value Ref Range   Glucose-Capillary 186 (H) 65 - 99 mg/dL   Comment 1 Notify RN   Glucose, capillary     Status: None   Collection Time: 11/25/16  9:31 PM  Result Value Ref Range   Glucose-Capillary 87 65 - 99 mg/dL  Glucose, capillary     Status: Abnormal   Collection Time: 11/26/16  7:38 AM  Result Value Ref Range   Glucose-Capillary 290 (H) 65 - 99 mg/dL   Comment 1 Notify RN   Phosphorus      Status: Abnormal   Collection Time: 11/26/16 10:36 AM  Result Value Ref Range   Phosphorus 1.3 (L) 2.5 - 4.6 mg/dL  Glucose, capillary     Status: Abnormal   Collection Time: 11/26/16 11:52 AM  Result Value Ref Range   Glucose-Capillary 213 (H) 65 - 99 mg/dL   Comment 1 Notify RN   Glucose, capillary     Status: Abnormal   Collection Time: 11/26/16  4:28 PM  Result Value Ref Range   Glucose-Capillary 110 (H) 65 - 99 mg/dL  Glucose, capillary     Status: Abnormal   Collection Time: 11/26/16 10:02 PM  Result Value Ref Range   Glucose-Capillary 122 (H) 65 - 99 mg/dL  Renal function panel     Status: Abnormal   Collection Time: 11/27/16  4:15 AM  Result Value Ref Range   Sodium 134 (L) 135 - 145 mmol/L   Potassium 4.4 3.5 - 5.1 mmol/L   Chloride 95 (L) 101 - 111 mmol/L   CO2 31 22 - 32 mmol/L   Glucose, Bld 28 (LL) 65 - 99 mg/dL    Comment: CRITICAL RESULT CALLED TO, READ BACK BY AND VERIFIED WITH DAWN SONGSTER AT 0532 11/27/16.PMH   BUN 35 (H) 6 - 20 mg/dL   Creatinine, Ser 4.33 (H) 0.61 - 1.24 mg/dL   Calcium 8.4 (L) 8.9 - 10.3 mg/dL   Phosphorus 1.7 (L) 2.5 - 4.6 mg/dL   Albumin 2.7 (L) 3.5 - 5.0 g/dL   GFR calc non Af Amer 13 (L) >60 mL/min   GFR calc Af Amer 16 (L) >60 mL/min    Comment: (NOTE) The eGFR has been calculated using the  CKD EPI equation. This calculation has not been validated in all clinical situations. eGFR's persistently <60 mL/min signify possible Chronic Kidney Disease.    Anion gap 8 5 - 15  Glucose, capillary     Status: Abnormal   Collection Time: 11/27/16  5:36 AM  Result Value Ref Range   Glucose-Capillary 24 (LL) 65 - 99 mg/dL  Glucose, capillary     Status: Abnormal   Collection Time: 11/27/16  5:57 AM  Result Value Ref Range   Glucose-Capillary 137 (H) 65 - 99 mg/dL  Glucose, capillary     Status: None   Collection Time: 11/27/16  6:23 AM  Result Value Ref Range   Glucose-Capillary 97 65 - 99 mg/dL  Glucose, capillary     Status: None    Collection Time: 11/27/16  7:06 AM  Result Value Ref Range   Glucose-Capillary 82 65 - 99 mg/dL  Glucose, capillary     Status: None   Collection Time: 11/27/16  8:08 AM  Result Value Ref Range   Glucose-Capillary 70 65 - 99 mg/dL  Glucose, capillary     Status: Abnormal   Collection Time: 11/27/16 12:02 PM  Result Value Ref Range   Glucose-Capillary 35 (LL) 65 - 99 mg/dL   Comment 1 Notify RN   Glucose, capillary     Status: Abnormal   Collection Time: 11/27/16 12:30 PM  Result Value Ref Range   Glucose-Capillary 162 (H) 65 - 99 mg/dL  CBC     Status: Abnormal   Collection Time: 11/27/16  2:04 PM  Result Value Ref Range   WBC 8.1 3.8 - 10.6 K/uL   RBC 3.83 (L) 4.40 - 5.90 MIL/uL   Hemoglobin 11.0 (L) 13.0 - 18.0 g/dL   HCT 34.6 (L) 40.0 - 52.0 %   MCV 90.3 80.0 - 100.0 fL   MCH 28.9 26.0 - 34.0 pg   MCHC 32.0 32.0 - 36.0 g/dL   RDW 17.1 (H) 11.5 - 14.5 %   Platelets 202 150 - 440 K/uL   No components found for: ESR, C REACTIVE PROTEIN MICRO: Recent Results (from the past 720 hour(s))  MRSA PCR Screening     Status: Abnormal   Collection Time: 11/23/16  6:37 PM  Result Value Ref Range Status   MRSA by PCR POSITIVE (A) NEGATIVE Final    Comment:        The GeneXpert MRSA Assay (FDA approved for NASAL specimens only), is one component of a comprehensive MRSA colonization surveillance program. It is not intended to diagnose MRSA infection nor to guide or monitor treatment for MRSA infections. RESULT CALLED TO, READ BACK BY AND VERIFIED WITH: STACEY CLAY 11/23/16 @ 2054  [place]   Anaerobic culture     Status: None (Preliminary result)   Collection Time: 11/24/16  7:19 PM  Result Value Ref Range Status   Specimen Description BONE LEFT FOOT 5TH RAY  Final   Special Requests NONE  Final   Culture   Final    CULTURE REINCUBATED FOR BETTER GROWTH Performed at [facility], [place] [address], [place], [place] [address]    Report Status PENDING  Incomplete  Aerobic  Culture (superficial specimen)     Status: None (Preliminary result)   Collection Time: 11/24/16  7:19 PM  Result Value Ref Range Status   Specimen Description BONE LEFT FOOT 5TH RAY  Final   Special Requests NONE  Final   Gram Stain NO WBC SEEN NO ORGANISMS SEEN  Final   Culture   Final    FEW PROTEUS MIRABILIS CULTURE REINCUBATED FOR BETTER GROWTH Performed at [facility], [place] [address] [place], [place] [address]    Report Status PENDING  Incomplete   Organism ID, Bacteria PROTEUS MIRABILIS  Final      Susceptibility   Proteus mirabilis - MIC*    AMPICILLIN <=2 SENSITIVE Sensitive     CEFAZOLIN <=4 SENSITIVE Sensitive     CEFEPIME <=1 SENSITIVE Sensitive     CEFTAZIDIME <=1 SENSITIVE Sensitive     CEFTRIAXONE <=1 SENSITIVE Sensitive     CIPROFLOXACIN <=0.25 SENSITIVE Sensitive     GENTAMICIN <=1 SENSITIVE Sensitive     IMIPENEM 1 SENSITIVE Sensitive     TRIMETH/SULFA <=20 SENSITIVE Sensitive     AMPICILLIN/SULBACTAM <=2 SENSITIVE Sensitive     PIP/TAZO <=4 SENSITIVE Sensitive     * FEW PROTEUS MIRABILIS    IMAGING: Dg Foot 2 Views Left  Result Date: 11/23/2016 CLINICAL DATA:  Fifth toe infection. EXAM: LEFT FOOT - 2 VIEW COMPARISON:  Left foot x-rays dated Sep 05, 2012. FINDINGS: There is extensive bony destruction of the fifth metatarsal and proximal phalanx with surrounding soft tissue gas. Permeative appearance of the proximal fifth metatarsal. No additional sites of cortical destruction. No acute fracture. Hyperflexion at the first IP joint. Osteopenia. IMPRESSION: 1. Extensive bony destruction of the fifth metatarsal and proximal phalanx, consistent with osteomyelitis. Electronically Signed   By: Titus Dubin M.D.   On: 11/23/2016 11:45    Assessment:   Marcus [name] is a 61 y.o. male with ESRD, admit with DM Foot ulcer and Osteomyelitis. S/p surgery 8/10 with amputation of L fifth toe and partial ray resection with abx bead placement. Cx with Proteus.    Recommendations At this point would suggest ancef at HD for a total of 4 weeks post op.  Can follow up with podiatry and I can see in 3-4 weeks time See abx order sheet Thank you very much for allowing me to participate in the care of this patient. Please call with questions.   Cheral Marker. Ola Spurr, MD

## 2016-11-27 NOTE — Progress Notes (Signed)
Hypoglycemic Event  CBG: 35 at 1202.   Treatment: amp of D50, nepro shake.   Symptoms: Asymptomatic   Follow-up CBG: Time:1230 CBG Result:162  Possible Reasons for Event: Dialysis pt, not eating very much.   Comments/MD notified: MD ordered amp of D50.     Marijo Conceptionenton, Barron Vanloan F

## 2016-11-27 NOTE — Progress Notes (Signed)
Patient had two amps of D50 earlier in the day along with several Nepro's and a extra sandwich at change of shift.  Glucose reading 451, spoke with Dr. Anne HahnWillis regarding fluctuating levels from last night until this reading.  No coverage per Dr. Anne HahnWillis at this time.  Plan to recheck at 2200 and determine coverage at that time if needed.

## 2016-11-27 NOTE — Care Management Important Message (Signed)
Important Message  Patient Details  Name: Vicie MuttersCharles O Parada MRN: [id number] Date of Birth: 1955-05-13   Medicare Important Message Given:  Yes    Chapman FitchBOWEN, Averey Trompeter T, RN 11/27/2016, 11:08 AM

## 2016-11-27 NOTE — Progress Notes (Signed)
Pt with critical BS of 28 per lab this AM, finger stick BS 24 on recheck. MD notified  1 ampule of Dextrose ordered and admin. On recheck pt BS 137 but trended down on subsequent checks to 97,  apple juice given but BS  down to 82. Will continue to monitor.

## 2016-11-27 NOTE — Progress Notes (Signed)
[facility]  ROUNDING NOTE   Subjective:   Patient states he feels cold.   Tmax 100.7  Hemodialysis for later today  Objective:  Vital signs in last 24 hours:  Temp:  [98.4 F (36.9 C)-99.1 F (37.3 C)] 99.1 F (37.3 C) (08/12 2203) Pulse Rate:  [62-73] 62 (08/13 0540) Resp:  [17-18] 17 (08/13 0540) BP: (99-112)/(47-51) 99/50 (08/13 0540) SpO2:  [94 %-100 %] 94 % (08/13 0540)  Weight change:  Filed Weights   11/23/16 1030 11/23/16 1721 11/24/16 0950  Weight: 59.4 kg (131 lb) 56.9 kg (125 lb 6.4 oz) 57 kg (125 lb 10.6 oz)    Intake/Output: No intake/output data recorded.   Intake/Output this shift:  No intake/output data recorded.  Physical Exam: General: Chronically ill appearing  Head: Normocephalic, atraumatic. Moist oral mucosal membranes  Eyes: Anicteric  Neck: Supple, trachea midline  Lungs:  Clear to auscultation, normal effort  Heart: S1S2 no rubs  Abdomen:  Soft, nontender, bowel sounds present  Extremities: Right foot in boot, Left foot wrapped   Neurologic: Awake, alert, following commands  Skin: No lesions  Access: Left femoral permcath    Basic Metabolic Panel:  Recent Labs Lab 11/23/16 1044 11/24/16 0500 11/24/16 1007 11/25/16 0416 11/26/16 1036 11/27/16 0415  NA 136 138  --  138  --  134*  K 3.9 3.3*  --  3.5  --  4.4  CL 98* 100*  --  99*  --  95*  CO2 29 29  --  30  --  31  GLUCOSE 214* 71  --  141*  --  28*  BUN 13 19  --  10  --  35*  CREATININE 3.09* 3.50*  --  2.58*  --  4.33*  CALCIUM 8.0* 8.1*  --  7.8*  --  8.4*  PHOS  --   --  <1.0*  --  1.3* 1.7*    Liver Function Tests:  Recent Labs Lab 11/23/16 1044 11/27/16 0415  AST 172*  --   ALT 41  --   ALKPHOS 138*  --   BILITOT 0.6  --   PROT 7.4  --   ALBUMIN 2.8* 2.7*   No results for input(s): LIPASE, AMYLASE in the last 168 hours. No results for input(s): AMMONIA in the last 168 hours.  CBC:  Recent Labs Lab 11/23/16 1044 11/24/16 0500  11/25/16 0416  WBC 3.5* 4.0 5.6  NEUTROABS 2.3  --   --   HGB 11.8* 11.3* 11.7*  HCT 36.4* 34.5* 36.4*  MCV 91.4 90.9 90.4  PLT 185 180 192    Cardiac Enzymes: No results for input(s): CKTOTAL, CKMB, CKMBINDEX, TROPONINI in the last 168 hours.  BNP: Invalid input(s): POCBNP  CBG:  Recent Labs Lab 11/27/16 0536 11/27/16 0557 11/27/16 0623 11/27/16 0706 11/27/16 0808  GLUCAP 24* 137* 97 82 70    Microbiology: Results for orders placed or performed during the hospital encounter of 11/23/16  MRSA PCR Screening     Status: Abnormal   Collection Time: 11/23/16  6:37 PM  Result Value Ref Range Status   MRSA by PCR POSITIVE (A) NEGATIVE Final    Comment:        The GeneXpert MRSA Assay (FDA approved for NASAL specimens only), is one component of a comprehensive MRSA colonization surveillance program. It is not intended to diagnose MRSA infection nor to guide or monitor treatment for MRSA infections. RESULT CALLED TO, READ BACK BY AND VERIFIED WITH: STACEY CLAY  11/23/16 @ 2054  [place]   Aerobic Culture (superficial specimen)     Status: None (Preliminary result)   Collection Time: 11/24/16  7:19 PM  Result Value Ref Range Status   Specimen Description BONE LEFT FOOT 5TH RAY  Final   Special Requests NONE  Final   Gram Stain   Final    NO WBC SEEN NO ORGANISMS SEEN Performed at [facility], [address] [place], [place] [address]    Culture FEW PROTEUS MIRABILIS  Final   Report Status PENDING  Incomplete    Coagulation Studies: No results for input(s): LABPROT, INR in the last 72 hours.  Urinalysis: No results for input(s): COLORURINE, LABSPEC, PHURINE, GLUCOSEU, HGBUR, BILIRUBINUR, KETONESUR, PROTEINUR, UROBILINOGEN, NITRITE, LEUKOCYTESUR in the last 72 hours.  Invalid input(s): APPERANCEUR    Imaging: No results found.   Medications:   . ceFEPime (MAXIPIME) IV Stopped (11/26/16 1120)  . vancomycin Stopped (11/24/16 1327)   . amLODipine  10  mg Oral QHS  . aspirin  81 mg Oral Daily  . B-complex with vitamin C  1 tablet Oral Daily  . carvedilol  25 mg Oral BID WC  . Chlorhexidine Gluconate Cloth  6 each Topical Q0600  . dextrose      . docusate sodium  100 mg Oral BID  . feeding supplement (NEPRO CARB STEADY)  237 mL Oral TID WC  . FLUoxetine  20 mg Oral Daily  . insulin aspart  0-9 Units Subcutaneous TID WC  . [START ON 11/28/2016] insulin glargine  9 Units Subcutaneous Daily  . levothyroxine  275 mcg Oral QAC breakfast  . loratadine  10 mg Oral Daily  . Melatonin  5 mg Oral QHS  . mirtazapine  15 mg Oral QHS  . mupirocin ointment  1 application Nasal BID  . polyethylene glycol  17 g Oral Daily  . pravastatin  10 mg Oral QHS  . saccharomyces boulardii  250 mg Oral Daily  . traZODone  100 mg Oral QHS   acetaminophen **OR** acetaminophen, albuterol, HYDROcodone-acetaminophen, ondansetron, oxyCODONE-acetaminophen, promethazine  Assessment/ Plan:  [name] is 61 y.o. black side effects with End Stage Renal Disease on hemodialysis MWF,  diabetes mellitus type 1, COPD, tobacco abuse, hypertension, anemia of chronic kidney disease, secondary hyperparathyroidism, history of CVA, prior episodes of DKA, who was admitted to [facility] on 11/23/2016 for evaluation of left fifth toe infection, s/p amputation left fifth toe 11/24/16 Dr. Alberteen Spindle  MWF/ [facility]/ [facility] L femoral permcath  1.  ESRD on HD MWF: hemodialysis for later today. Orders prepared.   2. Anemia chronic kidney disease: hemoglobin 11.7.  - holding EPO  3.  Secondary hyperparathyroidism: phosphorus 1.7, calcium low at 8.4 ( corrected at 9.4) - holding phos binders  4.  Hypertension: currently hypotensive 99/50 - Hold amlodipine and carvedilol  5.  Left foot osteomyelitis. Patient status post left fifth toe amputation on 11/24/2016 Dr. Alberteen Spindle - Vancomycin and cefepime   LOS: 4 Marcus [name] 8/13/201810:45 AM

## 2016-11-27 NOTE — Progress Notes (Signed)
[employer] [facility] [place] [facility]   PATIENT NAME: Marcus [name]    MR#:  [id number]  DATE OF BIRTH:  1955/11/14  SUBJECTIVE:  CHIEF COMPLAINT:   Chief Complaint  Patient presents with  . Wound Infection   Patient's blood sugars are dropping again today he is not eating much   REVIEW OF SYSTEMS:  Review of Systems  Constitutional: Negative for chills, fever and weight loss.  HENT: Negative for nosebleeds and sore throat.   Eyes: Negative for blurred vision.  Respiratory: Negative for cough, shortness of breath and wheezing.   Cardiovascular: Negative for chest pain, orthopnea, leg swelling and PND.  Gastrointestinal: Negative for abdominal pain, constipation, diarrhea, heartburn, nausea and vomiting.  Genitourinary: Negative for dysuria and urgency.  Musculoskeletal: Positive for joint pain. Negative for back pain.  Skin: Negative for rash.  Neurological: Negative for dizziness, speech change, focal weakness and headaches.  Endo/Heme/Allergies: Does not bruise/bleed easily.  Psychiatric/Behavioral: Negative for depression.    DRUG ALLERGIES:   Allergies  Allergen Reactions  . Penicillins Other (See Comments)    Unknown reaction   VITALS:  Blood pressure (!) 99/50, pulse 62, temperature 99.1 F (37.3 C), temperature source Oral, resp. rate 17, height 5\' 9"  (1.753 m), weight 125 lb 10.6 oz (57 kg), SpO2 94 %. PHYSICAL EXAMINATION:  Physical Exam  Constitutional: He is oriented to person, place, and time. He appears malnourished. He appears unhealthy. He has a sickly appearance.  HENT:  Head: Normocephalic and atraumatic.  Eyes: Pupils are equal, round, and reactive to light. Conjunctivae and EOM are normal.  Neck: Normal range of motion. Neck supple. No tracheal deviation present. No thyromegaly present.  Cardiovascular: Normal rate, regular rhythm and normal heart sounds.   Pulmonary/Chest: Effort normal and breath sounds normal. No respiratory  distress. He has no wheezes. He exhibits no tenderness.  Abdominal: Soft. Bowel sounds are normal. He exhibits no distension. There is no tenderness.  Musculoskeletal: Normal range of motion.  Neurological: He is alert and oriented to person, place, and time. No cranial nerve deficit.  Skin: Skin is warm and dry. Rash noted.  Dressing in place  Psychiatric: Mood and affect normal.   LABORATORY PANEL:  Male CBC  Recent Labs Lab 11/25/16 0416  WBC 5.6  HGB 11.7*  HCT 36.4*  PLT 192   ------------------------------------------------------------------------------------------------------------------ Chemistries   Recent Labs Lab 11/23/16 1044  11/27/16 0415  NA 136  < > 134*  K 3.9  < > 4.4  CL 98*  < > 95*  CO2 29  < > 31  GLUCOSE 214*  < > 28*  BUN 13  < > 35*  CREATININE 3.09*  < > 4.33*  CALCIUM 8.0*  < > 8.4*  AST 172*  --   --   ALT 41  --   --   ALKPHOS 138*  --   --   BILITOT 0.6  --   --   < > = values in this interval not displayed. RADIOLOGY:  No results found. ASSESSMENT AND PLAN:   1. osteomyelitis of the left fifth toe - Continue vancomycin and cefepime - Appreciate vascular surgery and podiatry input - status post surgery   - Await culture results - Plan for vascular evaluation hopefully next Tuesday - We have to keep patient until the vascular evaluation is done  2. End-stage renal disease on dialysis Monday Wednesday Friday - Continue hemodialysis  3. Essential hypertension continueon Norvasc and Coreg  4.  Hypoglycemia with history of diabetes: Patient now eating blood sugars are back to being fluctuant continue Lantus 15 units  5. Hypothyroidism unspecified continue on Synthroid  6.  Hyperlipidemia, on statin  7.  Hypokalemia: stable with dialysis  8. Tobacco abuse. Smoking cessation counseling done     All the records are reviewed and case discussed with Care Management/Social Worker. Management plans discussed with the patient,  nursing and they are in agreement.  CODE STATUS: Full Code  TOTAL TIME TAKING CARE OF THIS PATIENT: 25 minutes.   More than 50% of the time was spent in counseling/coordination of care: YES  POSSIBLE D/C early next week, DEPENDING ON CLINICAL CONDITION.  And podiatry and vascular surgery evaluation   Auburn Bilberry M.D on 11/27/2016 at 1:51 PM  Between 7am to 6pm - Pager - [phone]  After 6pm go to [email or web address] - Social research officer, government  [facility]  Office  [phone]  CC: Primary care physician; Lindwood Qua, MD  Note: This dictation was prepared with Dragon dictation along with smaller phrase technology. Any transcriptional errors that result from this process are unintentional.

## 2016-11-27 NOTE — Progress Notes (Signed)
HD initiated via L Fem cath without issue. Patient having multiple liquid stools. Linens changed x2 prior to initiation of dialysis.

## 2016-11-27 NOTE — Consult Note (Signed)
Pharmacy Antibiotic Note  Vicie MuttersCharles O Ruby is a 61 y.o. male admitted on 11/23/2016 with diabetic foot infection.  Pharmacy has been consulted for vancomycin and cefepime dosing. Pt is an ESRD on HD MWF  Plan: Will discuss possible need for addition of metronidazole to this regiment with MD.  Vancomycin 500 mg IV qMWF  Level prior to the 3rd HD session, goal 15-25 mcg/ml Cefepime 1g q 24hr  Height: 5\' 9"  (175.3 cm) Weight: 125 lb 10.6 oz (57 kg) IBW/kg (Calculated) : 70.7  Temp (24hrs), Avg:98.8 F (37.1 C), Min:98.4 F (36.9 C), Max:99.1 F (37.3 C)   Recent Labs Lab 11/23/16 1044 11/24/16 0500 11/25/16 0416 11/27/16 0415  WBC 3.5* 4.0 5.6  --   CREATININE 3.09* 3.50* 2.58* 4.33*    Estimated Creatinine Clearance: 14.4 mL/min (A) (by C-G formula based on SCr of 4.33 mg/dL (H)).    Allergies  Allergen Reactions  . Penicillins Other (See Comments)    Unknown reaction    Antimicrobials this admission: vancomycin 8/9 >>  zosyn 8/9 >>   Dose adjustments this admission:   Microbiology results: MRSA PCR: positive Wound cx: Proteus mirabilis  Thank you for allowing pharmacy to be a part of this patient's care.  Luisa HartScott Devany Aja, PharmD Clinical Pharmacist  11/27/2016 8:38 AM

## 2016-11-27 NOTE — Progress Notes (Signed)
PT Cancellation Note  Patient Details Name: Marcus [name] MRN: [id number] DOB: 02-15-1956   Cancelled Treatment:    Reason Eval/Treat Not Completed: Patient at procedure or test/unavailable (Patient now off unit for dialysis; will re-attempt next date as patient available and medically appropriate.)   [name] H. Manson PasseyBrown, PT, DPT, NCS 11/27/16, 3:47 PM [phone]

## 2016-11-27 NOTE — Progress Notes (Signed)
PT Cancellation Note  Patient Details Name: Marcus MuttersCharles O Pettis MRN: [id number] DOB: [birth year]   Cancelled Treatment:    Reason Eval/Treat Not Completed: Medical issues which prohibited therapy (Evaluation re-attempted.  CNA in during interivew for finger-stick; FSBS noted at 35.  Primary RN informed/aware.  Will hold exertional activity at this time and re-attempt as medically appropriate.)  Patient scheduled for dialysis this PM.  Of note, patient long-term care resident of skilled nursing facility; [facility] level as primary mobility (transfers only, stand pivot) with assist from staff as needed.    Per telephone clarification with Dr. Alberteen Spindleline, patient okay for bathroom privileges with heel WBing, flat surgical shoe with mobility efforts.    Jeremy Ditullio H. Manson PasseyBrown, PT, DPT, NCS 11/27/16, 12:09 PM [phone]

## 2016-11-28 ENCOUNTER — Encounter: Payer: Self-pay | Admitting: *Deleted

## 2016-11-28 ENCOUNTER — Encounter
Admission: EM | Disposition: A | Discharge status: Skilled Nursing Facility | Payer: Self-pay | Source: Home / Self Care | Attending: Internal Medicine

## 2016-11-28 LAB — GLUCOSE, CAPILLARY
GLUCOSE-CAPILLARY: 240 mg/dL — AB (ref 65–99)
GLUCOSE-CAPILLARY: 280 mg/dL — AB (ref 65–99)
GLUCOSE-CAPILLARY: 176 mg/dL — AB (ref 65–99)
Glucose-Capillary: 297 mg/dL — ABNORMAL HIGH (ref 65–99)
Glucose-Capillary: 174 mg/dL — ABNORMAL HIGH (ref 65–99)
Glucose-Capillary: 177 mg/dL — ABNORMAL HIGH (ref 65–99)

## 2016-11-28 LAB — SURGICAL PATHOLOGY

## 2016-11-28 SURGERY — ABDOMINAL AORTOGRAM W/LOWER EXTREMITY
Anesthesia: Moderate Sedation | Laterality: Left

## 2016-11-28 MED ORDER — INSULIN ASPART 100 UNIT/ML ~~LOC~~ SOLN
0.0000 [IU] | Freq: Every day | SUBCUTANEOUS | Status: DC
  Administered 2016-11-29: 3 [IU] via SUBCUTANEOUS
  Filled 2016-11-28: qty 1

## 2016-11-28 MED ORDER — INSULIN ASPART 100 UNIT/ML ~~LOC~~ SOLN
0.0000 [IU] | Freq: Three times a day (TID) | SUBCUTANEOUS | Status: DC
  Administered 2016-11-28: 3 [IU] via SUBCUTANEOUS
  Administered 2016-11-29: 15 [IU] via SUBCUTANEOUS
  Administered 2016-11-30: 2 [IU] via SUBCUTANEOUS
  Filled 2016-11-28 (×3): qty 1

## 2016-11-28 MED ORDER — INSULIN GLARGINE 100 UNIT/ML ~~LOC~~ SOLN: 7.0000 [IU] | Freq: Every day | SUBCUTANEOUS | Status: DC

## 2016-11-28 MED ORDER — INSULIN REGULAR HUMAN 100 UNIT/ML IJ SOLN
5.0000 [IU] | Freq: Once | INTRAMUSCULAR | Status: AC
  Administered 2016-11-28: 5 [IU] via INTRAVENOUS
  Filled 2016-11-28: qty 0.05

## 2016-11-28 NOTE — Evaluation (Signed)
Physical Therapy Evaluation Patient Details Name: Marcus [name] MRN: [id number] DOB: 01/15/1956 Today's Date: 11/28/2016   History of Present Illness  presented to ER secondary to non-healing wound to L foot; admitted with osteomyelitis.  Status post L 5th toe amputation and partial ray resection.  Clinical Impression  Patient alert and oriented to basic information; generally frustrated with situation and requests of therapist (however, agrees to continued efforts). Patient generally weak and deconditioned throughout all extremities with greater weakness distally > proximally.  Requiring mod assist for bed mobility, min assist for unsupported sitting balance and mod assist for squat pivot transfer from bed/chair.  Extensive assist for forward weight shift, lift off and lateral movement.  Maintains L LE anterior to BOS with primary WBing through R LE. Voices understanding of L LE WBing precautions; however, question full understanding of functional implications.  Will continue to reinforce. Would benefit from skilled PT to address above deficits and promote optimal return to PLOF; recommend transition to STR upon discharge from acute hospitalization.     Follow Up Recommendations SNF    Equipment Recommendations       Recommendations for Other Services       Precautions / Restrictions Precautions Precautions: Fall Precaution Comments: Contact isolation Restrictions Weight Bearing Restrictions: Yes LLE Partial Weight Bearing Percentage or Pounds: Bathroom privileges with heel WBing, post-op shoe L LE      Mobility  Bed Mobility Overal bed mobility: Needs Assistance Bed Mobility: Supine to Sit     Supine to sit: Min assist;Mod assist     General bed mobility comments: limited active effort/engagement with movement transition; agitated with mobility efforts  Transfers Overall transfer level: Needs assistance   Transfers: Squat Pivot Transfers     Squat pivot transfers:  Mod assist     General transfer comment: assist from therapist for forward weight shift, lift off and initiation of lateral movement.  Tends to maintain L LE anterior to BOS with primary WBing through R LE  Ambulation/Gait             General Gait Details: unsafe/unable; non-ambulatory at baseline  Stairs            Wheelchair Mobility    Modified Rankin (Stroke Patients Only)       Balance Overall balance assessment: Needs assistance Sitting-balance support: No upper extremity supported;Feet supported Sitting balance-Leahy Scale: Fair                                       Pertinent Vitals/Pain Pain Assessment: No/denies pain    Home Living Family/patient expects to be discharged to:: Skilled nursing facility                      Prior Function           Comments: Patient long-term resident of care facility; [facility] level as primary mobility. Basic transfers via scoot/squat pivot with assist from staff as needed.     Hand Dominance        Extremity/Trunk Assessment   Upper Extremity Assessment Upper Extremity Assessment: Generalized weakness (generalized weakness, 3-/5 throughout; maintains 2-5 digits in flexion at PIP and DIPs (contracted))    Lower Extremity Assessment Lower Extremity Assessment: Generalized weakness (bilat hips, knees grossly 3-/5, ankle PF/DF 0/5.  bilat ankle DF to neutral)       Communication   Communication: No difficulties  Cognition  Arousal/Alertness: Awake/alert Behavior During Therapy: Agitated Overall Cognitive Status: Difficult to assess                                 General Comments: patient generally agitated with questions, assessment      General Comments      Exercises Other Exercises Other Exercises: Educated in use of post-op shoe, WBing restrictions and functional implications; patient voiced understanding, but question full understanding and adherence.    Assessment/Plan    PT Assessment Patient needs continued PT services  PT Problem List Decreased strength;Decreased range of motion;Decreased activity tolerance;Decreased balance;Decreased mobility;Decreased coordination;Decreased cognition;Decreased knowledge of use of DME;Decreased safety awareness;Decreased knowledge of precautions;Decreased skin integrity;Pain       PT Treatment Interventions DME instruction;Functional mobility training;Therapeutic activities;Therapeutic exercise;Balance training;Patient/family education    PT Goals (Current goals can be found in the Care Plan section)  Acute Rehab PT Goals Patient Stated Goal: "to get this shit over with" PT Goal Formulation: With patient Time For Goal Achievement: 12/12/16 Potential to Achieve Goals: Fair    Frequency Min 2X/week   Barriers to discharge Decreased caregiver support      Co-evaluation               AM-PAC PT "6 Clicks" Daily Activity  Outcome Measure Difficulty turning over in bed (including adjusting bedclothes, sheets and blankets)?: Total Difficulty moving from lying on back to sitting on the side of the bed? : Total Difficulty sitting down on and standing up from a chair with arms (e.g., wheelchair, bedside commode, etc,.)?: Total Help needed moving to and from a bed to chair (including a wheelchair)?: A Lot Help needed walking in hospital room?: A Lot Help needed climbing 3-5 steps with a railing? : Total 6 Click Score: 8    End of Session   Activity Tolerance: Treatment limited secondary to agitation Patient left: in chair;with call bell/phone within reach;with chair alarm set Nurse Communication: Mobility status PT Visit Diagnosis: Muscle weakness (generalized) (M62.81)    Time: 7829-56211112-1130 PT Time Calculation (min) (ACUTE ONLY): 18 min   Charges:   PT Evaluation $PT Eval Low Complexity: 1 Low PT Treatments $Therapeutic Activity: 8-22 mins   PT G Codes:   PT G-Codes **NOT FOR  INPATIENT CLASS** Functional Assessment Tool Used: AM-PAC 6 Clicks Basic Mobility Functional Limitation: Mobility: Walking and moving around Mobility: Walking and Moving Around Current Status (H0865(G8978): At least 60 percent but less than 80 percent impaired, limited or restricted Mobility: Walking and Moving Around Goal Status [phone](G8979): At least 20 percent but less than 40 percent impaired, limited or restricted    Vontae Court H. Manson PasseyBrown, PT, DPT, NCS 11/28/16, 4:17 PM [phone]

## 2016-11-28 NOTE — Progress Notes (Signed)
Day of Surgery   Subjective/Chief Complaint: Patient seen. No real complaints with his foot.   Objective: Vital signs in last 24 hours: Temp:  [98.4 F (36.9 C)-99.4 F (37.4 C)] 98.5 F (36.9 C) (08/14 1509) Pulse Rate:  [67-79] 67 (08/14 1509) Resp:  [12-20] 14 (08/14 1509) BP: (116-134)/(52-66) 116/60 (08/14 1509) SpO2:  [95 %-100 %] 100 % (08/14 1509) Weight:  [56.7 kg (125 lb)] 56.7 kg (125 lb) (08/14 1211) Last BM Date: 11/27/16  Intake/Output from previous day: 08/13 0701 - 08/14 0700 In: 817 [P.O.:480; NG/GT:237; IV Piggyback:100] Out: 1000  Intake/Output this shift: No intake/output data recorded.  The bandages dry and intact. Upon removal there is heavy bleeding still noted on the bandaging. The incision is well coapted. No purulence is noted on the bandaging. Cellulitis and edema are significantly improved.  Lab Results:   Recent Labs  11/27/16 1404  WBC 8.1  HGB 11.0*  HCT 34.6*  PLT 202   BMET  Recent Labs  11/27/16 0415  NA 134*  K 4.4  CL 95*  CO2 31  GLUCOSE 28*  BUN 35*  CREATININE 4.33*  CALCIUM 8.4*   PT/INR No results for input(s): LABPROT, INR in the last 72 hours. ABG No results for input(s): PHART, HCO3 in the last 72 hours.  Invalid input(s): PCO2, PO2  Studies/Results: No results found.  Anti-infectives: Anti-infectives    Start     Dose/Rate Route Frequency Ordered Stop   12/01/16 1800  ceFAZolin (ANCEF) 3 g in dextrose 5 % 50 mL IVPB     3 g 130 mL/hr over 30 Minutes Intravenous Every Fri (Hemodialysis) 11/27/16 1712     11/27/16 1800  ceFAZolin (ANCEF) IVPB 2g/100 mL premix     2 g 200 mL/hr over 30 Minutes Intravenous Once per day on Mon Wed 11/27/16 1710     11/24/16 1200  vancomycin (VANCOCIN) 500 mg in sodium chloride 0.9 % 100 mL IVPB  Status:  Discontinued     500 mg 100 mL/hr over 60 Minutes Intravenous Every M-W-F (Hemodialysis) 11/23/16 1545 11/28/16 0933   11/24/16 1000  ceFEPIme (MAXIPIME) 1 g in dextrose  5 % 50 mL IVPB  Status:  Discontinued     1 g 100 mL/hr over 30 Minutes Intravenous Every 24 hours 11/23/16 1545 11/27/16 1707   11/23/16 1600  vancomycin (VANCOCIN) 500 mg in sodium chloride 0.9 % 100 mL IVPB     500 mg 100 mL/hr over 60 Minutes Intravenous  Once 11/23/16 1545 11/23/16 1835   11/23/16 1400  ceFEPIme (MAXIPIME) 1 g in dextrose 5 % 50 mL IVPB     1 g 100 mL/hr over 30 Minutes Intravenous  Once 11/23/16 1348 11/23/16 1905   11/23/16 1200  vancomycin (VANCOCIN) IVPB 1000 mg/200 mL premix     1,000 mg 200 mL/hr over 60 Minutes Intravenous  Once 11/23/16 1151 11/23/16 1256      Assessment/Plan: s/p Procedure(s): ABDOMINAL AORTOGRAM W/LOWER EXTREMITY (Left) Assessment: Good progress status post fifth ray resection left foot   Plan: Betadine and a sterile bandage reapplied to the left foot. Patient is stable for discharge at this point on appropriate antibiotics. Recommend dressing changes 3 times a week either with home health care or at a skilled facility depending on where the patient is placed. Follow-up in 1 week outpatient.  LOS: 5 days    [name] W Wendelin Reader 11/28/2016

## 2016-11-28 NOTE — Progress Notes (Signed)
Notified Dr. Allena KatzPatel that patient was brought back to room and they were unable to do vascular procedure. Per MD okay to place order for previous diet.

## 2016-11-28 NOTE — Progress Notes (Signed)
Inpatient Diabetes Program Recommendations  AACE/ADA: New Consensus Statement on Inpatient Glycemic Control (2015)  Target Ranges:  Prepandial:   less than 140 mg/dL      Peak postprandial:   less than 180 mg/dL (1-2 hours)      Critically ill patients:  140 - 180 mg/dL   Results for Vicie MuttersSTALEY, Capers O (MRN [id number]) as of 11/28/2016 08:54  Ref. Range 11/27/2016 05:36 11/27/2016 05:57 11/27/2016 06:23 11/27/2016 07:06 11/27/2016 08:08 11/27/2016 12:02 11/27/2016 12:30 11/27/2016 16:25 11/27/2016 20:37 11/27/2016 22:13 11/27/2016 23:38  Glucose-Capillary Latest Ref Range: 65 - 99 mg/dL 24 (LL) 696137 (H) 97 82 70 35 (LL) 162 (H) 182 (H) 451 (H) 515 (HH) 494 (H)   Results for Vicie MuttersSTALEY, Dmario O (MRN [id number]) as of 11/28/2016 08:54  Ref. Range 11/28/2016 04:49  Glucose-Capillary Latest Ref Range: 65 - 99 mg/dL 440240 (H)    Diabetes history:Type 1 since age 61-[facility] (recent visit on 11/16/16)  Outpatient DM meds: Lantus 5 units qhs                                     Novolog 1-5 units tid with meals (based on pre-meal blood sugar and amount to be eaten)  Current Insulin Orders: Novolog Sensitive Correction Scale/ SSI (0-9 units) TID AC       MD- Patient has Type 1 diabetes and makes NO insulin.  [facility] on 11/16/16.  At that visit, pt was instructed to take Lantus 5 units QHS and to continue Novolog at home.  Note patient was given 15 units Lantus the morning of 08/12.  CBG down to 24 mg/dl yesterday AM (01/2707/13).  Patient then had another Hypoglycemic event yesterday at 12pm.  No Lantus administered yesterday (08/13).  Note that CBG extremely elevated yesterday at bedtime.  Patient given 9 units Novolog at 10:30pm and then 5 units Regular Insulin IV at 2:30am.   Please consider the following insulin adjustments:  1. Start back Lantus at 5 units daily (start this AM) (patient's home dose)  2. Start low dose Novolog Meal Coverage once  patient resumes diet today: Novolog 2 units TID with meals (hold if pt eats <50% of meal)       --Will follow patient during hospitalization--  Ambrose FinlandJeannine Johnston Lyndsay Talamante RN, MSN, CDE Diabetes Coordinator Inpatient Glycemic Control Team Team Pager: [phone] (8a-5p)

## 2016-11-28 NOTE — Progress Notes (Signed)
Notified MD that patient is refusing to allow fingersticks and is NPO at this time. Will continue to monitor and no new orders received. Pt stated that he would notify RN if he felt that his sugar was low.

## 2016-11-28 NOTE — Progress Notes (Signed)
[facility]  ROUNDING NOTE   Subjective:   Afebrile.   Angiogram for today.   Hemodialysis treatment yesterday. Tolerated treatment well. UF of 1 liter  Objective:  Vital signs in last 24 hours:  Temp:  [98.4 F (36.9 C)-99.4 F (37.4 C)] 98.4 F (36.9 C) (08/14 0512) Pulse Rate:  [61-79] 70 (08/14 0512) Resp:  [10-20] 20 (08/14 0512) BP: (115-134)/(52-71) 127/65 (08/14 0512) SpO2:  [95 %-100 %] 100 % (08/14 0512)  Weight change:  Filed Weights   11/23/16 1030 11/23/16 1721 11/24/16 0950  Weight: 59.4 kg (131 lb) 56.9 kg (125 lb 6.4 oz) 57 kg (125 lb 10.6 oz)    Intake/Output: I/O last 3 completed shifts: In: 817 [P.O.:480; NG/GT:237; IV Piggyback:100] Out: 1000 [Other:1000]   Intake/Output this shift:  No intake/output data recorded.  Physical Exam: General: Chronically ill appearing  Head: Normocephalic, atraumatic. Moist oral mucosal membranes  Eyes: Anicteric  Neck: Supple, trachea midline  Lungs:  Clear to auscultation, normal effort  Heart: S1S2 no rubs  Abdomen:  Soft, nontender, bowel sounds present  Extremities: Right foot in boot, Left foot wrapped   Neurologic: Awake, alert, following commands  Skin: No lesions  Access: Left femoral permcath    Basic Metabolic Panel:  Recent Labs Lab 11/23/16 1044 11/24/16 0500 11/24/16 1007 11/25/16 0416 11/26/16 1036 11/27/16 0415  NA 136 138  --  138  --  134*  K 3.9 3.3*  --  3.5  --  4.4  CL 98* 100*  --  99*  --  95*  CO2 29 29  --  30  --  31  GLUCOSE 214* 71  --  141*  --  28*  BUN 13 19  --  10  --  35*  CREATININE 3.09* 3.50*  --  2.58*  --  4.33*  CALCIUM 8.0* 8.1*  --  7.8*  --  8.4*  PHOS  --   --  <1.0*  --  1.3* 1.7*    Liver Function Tests:  Recent Labs Lab 11/23/16 1044 11/27/16 0415  AST 172*  --   ALT 41  --   ALKPHOS 138*  --   BILITOT 0.6  --   PROT 7.4  --   ALBUMIN 2.8* 2.7*   No results for input(s): LIPASE, AMYLASE in the last 168 hours. No results  for input(s): AMMONIA in the last 168 hours.  CBC:  Recent Labs Lab 11/23/16 1044 11/24/16 0500 11/25/16 0416 11/27/16 1404  WBC 3.5* 4.0 5.6 8.1  NEUTROABS 2.3  --   --   --   HGB 11.8* 11.3* 11.7* 11.0*  HCT 36.4* 34.5* 36.4* 34.6*  MCV 91.4 90.9 90.4 90.3  PLT 185 180 192 202    Cardiac Enzymes: No results for input(s): CKTOTAL, CKMB, CKMBINDEX, TROPONINI in the last 168 hours.  BNP: Invalid input(s): POCBNP  CBG:  Recent Labs Lab 11/27/16 2037 11/27/16 2213 11/27/16 2338 11/28/16 0449 11/28/16 0940  GLUCAP 451* 515* 494* 240* 280*    Microbiology: Results for orders placed or performed during the hospital encounter of 11/23/16  MRSA PCR Screening     Status: Abnormal   Collection Time: 11/23/16  6:37 PM  Result Value Ref Range Status   MRSA by PCR POSITIVE (A) NEGATIVE Final    Comment:        The GeneXpert MRSA Assay (FDA approved for NASAL specimens only), is one component of a comprehensive MRSA colonization surveillance program. It is not intended  to diagnose MRSA infection nor to guide or monitor treatment for MRSA infections. RESULT CALLED TO, READ BACK BY AND VERIFIED WITH: STACEY CLAY 11/23/16 @ 2054  MLK   Anaerobic culture     Status: None (Preliminary result)   Collection Time: 11/24/16  7:19 PM  Result Value Ref Range Status   Specimen Description BONE LEFT FOOT 5TH RAY  Final   Special Requests NONE  Final   Culture   Final    CULTURE REINCUBATED FOR BETTER GROWTH Performed at [facility], [address], [place], [place] [address]    Report Status PENDING  Incomplete  Aerobic Culture (superficial specimen)     Status: None (Preliminary result)   Collection Time: 11/24/16  7:19 PM  Result Value Ref Range Status   Specimen Description BONE LEFT FOOT 5TH RAY  Final   Special Requests NONE  Final   Gram Stain NO WBC SEEN NO ORGANISMS SEEN   Final   Culture   Final    FEW PROTEUS MIRABILIS CULTURE REINCUBATED FOR BETTER  GROWTH Performed at [facility], [address], [place], [place] [address]    Report Status PENDING  Incomplete   Organism ID, Bacteria PROTEUS MIRABILIS  Final      Susceptibility   Proteus mirabilis - MIC*    AMPICILLIN <=2 SENSITIVE Sensitive     CEFAZOLIN <=4 SENSITIVE Sensitive     CEFEPIME <=1 SENSITIVE Sensitive     CEFTAZIDIME <=1 SENSITIVE Sensitive     CEFTRIAXONE <=1 SENSITIVE Sensitive     CIPROFLOXACIN <=0.25 SENSITIVE Sensitive     GENTAMICIN <=1 SENSITIVE Sensitive     IMIPENEM 1 SENSITIVE Sensitive     TRIMETH/SULFA <=20 SENSITIVE Sensitive     AMPICILLIN/SULBACTAM <=2 SENSITIVE Sensitive     PIP/TAZO <=4 SENSITIVE Sensitive     * FEW PROTEUS MIRABILIS    Coagulation Studies: No results for input(s): LABPROT, INR in the last 72 hours.  Urinalysis: No results for input(s): COLORURINE, LABSPEC, PHURINE, GLUCOSEU, HGBUR, BILIRUBINUR, KETONESUR, PROTEINUR, UROBILINOGEN, NITRITE, LEUKOCYTESUR in the last 72 hours.  Invalid input(s): APPERANCEUR    Imaging: No results found.   Medications:   . [START ON 12/01/2016]  ceFAZolin (ANCEF) IV    .  ceFAZolin (ANCEF) IV 2 g (11/27/16 1755)   . aspirin  81 mg Oral Daily  . B-complex with vitamin C  1 tablet Oral Daily  . Chlorhexidine Gluconate Cloth  6 each Topical Q0600  . docusate sodium  100 mg Oral BID  . feeding supplement (NEPRO CARB STEADY)  237 mL Oral TID WC  . FLUoxetine  20 mg Oral Daily  . heparin subcutaneous  5,000 Units Subcutaneous Q8H  . insulin aspart  0-9 Units Subcutaneous TID WC  . levothyroxine  275 mcg Oral QAC breakfast  . loratadine  10 mg Oral Daily  . Melatonin  5 mg Oral QHS  . mirtazapine  15 mg Oral QHS  . mupirocin ointment  1 application Nasal BID  . polyethylene glycol  17 g Oral Daily  . pravastatin  10 mg Oral QHS  . saccharomyces boulardii  250 mg Oral Daily  . traZODone  100 mg Oral QHS   acetaminophen **OR** acetaminophen, albuterol, HYDROcodone-acetaminophen,  ondansetron, oxyCODONE-acetaminophen, promethazine  Assessment/ Plan:  [name] is 61 y.o. black side effects with End Stage Renal Disease on hemodialysis MWF,  diabetes mellitus type 1, COPD, tobacco abuse, hypertension, anemia of chronic kidney disease, secondary hyperparathyroidism, history  of CVA, prior episodes of DKA, who was [facility] 11[facility]23/2016 for evaluation of left fifth toe infection, s/p amputation left fifth toe 11/24/16 Dr. Alberteen Spindleline  [facility]  1.  ESRD on HD MWF: Hemodialysis yesterday. Tolerated treatment well. Uf of 1 liter.  - Next treatment for tomorrow.    2. Anemia chronic kidney disease: hemoglobin 11 - holding EPO - Mircera as outpatient  3.  Secondary hyperparathyroidism: phosphorus 1.7, calcium low at 8.4 ( corrected at 9.4) - holding phos binders  4.  Hypertension: holding home medications  5.  Left foot osteomyelitis. Patient status post left fifth toe amputation on 11/24/2016 Dr. Alberteen Spindleline Appreciate ID input.  - Vancomycin 500mg  IV for 4 weeks. End date 9/7.    LOS: 5 Darril Patriarca 8/14/201810:44 AM

## 2016-11-28 NOTE — Progress Notes (Signed)
[facility] [place] [facility]   PATIENT NAME: Marcus [name]    MR#:  [id number]  DATE OF BIRTH:  Nov 10, 1955  SUBJECTIVE:  CHIEF COMPLAINT:   Chief Complaint  Patient presents with  . Wound Infection   Little agitated and refuses to have his blood sugar checked Awaiting arteriogram   REVIEW OF SYSTEMS:  Review of Systems  Constitutional: Negative for chills, fever and weight loss.  HENT: Negative for nosebleeds and sore throat.   Eyes: Negative for blurred vision.  Respiratory: Negative for cough, shortness of breath and wheezing.   Cardiovascular: Negative for chest pain, orthopnea, leg swelling and PND.  Gastrointestinal: Negative for abdominal pain, constipation, diarrhea, heartburn, nausea and vomiting.  Genitourinary: Negative for dysuria and urgency.  Musculoskeletal: Positive for joint pain. Negative for back pain.  Skin: Negative for rash.  Neurological: Negative for dizziness, speech change, focal weakness and headaches.  Endo/Heme/Allergies: Does not bruise/bleed easily.  Psychiatric/Behavioral: Negative for depression.    DRUG ALLERGIES:   Allergies  Allergen Reactions  . Penicillins Other (See Comments)    Unknown reaction   VITALS:  Blood pressure 127/65, pulse 70, temperature 98.4 F (36.9 C), temperature source Oral, resp. rate 20, height 5\' 9"  (1.753 m), weight 125 lb 10.6 oz (57 kg), SpO2 100 %. PHYSICAL EXAMINATION:  Physical Exam  Constitutional: He is oriented to person, place, and time. He appears malnourished. He appears unhealthy. He has a sickly appearance.  HENT:  Head: Normocephalic and atraumatic.  Eyes: Pupils are equal, round, and reactive to light. Conjunctivae and EOM are normal.  Neck: Normal range of motion. Neck supple. No tracheal deviation present. No thyromegaly present.  Cardiovascular: Normal rate, regular rhythm and normal heart sounds.   Pulmonary/Chest: Effort normal and breath sounds normal. No  respiratory distress. He has no wheezes. He exhibits no tenderness.  Abdominal: Soft. Bowel sounds are normal. He exhibits no distension. There is no tenderness.  Musculoskeletal: Normal range of motion.  Neurological: He is alert and oriented to person, place, and time. No cranial nerve deficit.  Skin: Skin is warm and dry. Rash noted.  Dressing in place  Psychiatric: Mood and affect normal.   LABORATORY PANEL:  Male CBC  Recent Labs Lab 11/27/16 1404  WBC 8.1  HGB 11.0*  HCT 34.6*  PLT 202   ------------------------------------------------------------------------------------------------------------------ Chemistries   Recent Labs Lab 11/23/16 1044  11/27/16 0415  NA 136  < > 134*  K 3.9  < > 4.4  CL 98*  < > 95*  CO2 29  < > 31  GLUCOSE 214*  < > 28*  BUN 13  < > 35*  CREATININE 3.09*  < > 4.33*  CALCIUM 8.0*  < > 8.4*  AST 172*  --   --   ALT 41  --   --   ALKPHOS 138*  --   --   BILITOT 0.6  --   --   < > = values in this interval not displayed. RADIOLOGY:  No results found. ASSESSMENT AND PLAN:   1. osteomyelitis of the left fifth toe - Ancef per ID with hemodialysis - Appreciate vascular surgery and podiatry input - status post surgery   - Await culture results - Arteriogram today further recommendations post procedure   2. End-stage renal disease on dialysis Monday Wednesday Friday - Continue hemodialysis  3. Essential hypertension continueon Norvasc and Coreg  4.  Hypoglycemia with history of diabetes: Patient blood sugars are elevated diabetic  coordinator recommended resumption of Lantus at lower dose however patient was nothing by mouth for his procedure so we'll hold off on Lantus until postprocedure  5. Hypothyroidism unspecified continue on Synthroid  6.  Hyperlipidemia, on statin  7.  Hypokalemia: stable with dialysis  8. Tobacco abuse. Smoking cessation counseling done     All the records are reviewed and case discussed with Care  Management/Social Worker. Management plans discussed with the patient, nursing and they are in agreement.  CODE STATUS: Full Code  TOTAL TIME TAKING CARE OF THIS PATIENT: 25 minutes.   More than 50% of the time was spent in counseling/coordination of care: YES  POSSIBLE D/C early next week, DEPENDING ON CLINICAL CONDITION.  And podiatry and vascular surgery evaluation   Auburn BilberryPATEL, Talya Quain M.D on 11/28/2016 at 11:32 AM  Between 7am to 6pm - Pager - [phone]  After 6pm go to [email or web address] - Social research officer, governmentpassword [email or web address]  [facility]  Office  [phone]  CC: Primary care physician; Lindwood QuaHoffman, Byron, MD  Note: This dictation was prepared with Dragon dictation along with smaller phrase technology. Any transcriptional errors that result from this process are unintentional.

## 2016-11-29 ENCOUNTER — Ambulatory Visit
Admission: EM | Disposition: A | Discharge status: Skilled Nursing Facility | Payer: Self-pay | Source: Home / Self Care | Attending: Internal Medicine

## 2016-11-29 DIAGNOSIS — I70262 Atherosclerosis of native arteries of extremities with gangrene, left leg: Secondary | ICD-10-CM

## 2016-11-29 HISTORY — PX: LOWER EXTREMITY INTERVENTION: CATH118252

## 2016-11-29 HISTORY — PX: ABDOMINAL AORTOGRAM W/LOWER EXTREMITY: CATH118223

## 2016-11-29 LAB — CBC
HCT: 31 % — ABNORMAL LOW (ref 40.0–52.0)
Hemoglobin: 10.1 g/dL — ABNORMAL LOW (ref 13.0–18.0)
MCH: 29.1 pg (ref 26.0–34.0)
MCHC: 32.6 g/dL (ref 32.0–36.0)
MCV: 89.3 fL (ref 80.0–100.0)
PLATELETS: 217 10*3/uL (ref 150–440)
RBC: 3.47 MIL/uL — ABNORMAL LOW (ref 4.40–5.90)
RDW: 16.7 % — AB (ref 11.5–14.5)
WBC: 6.5 10*3/uL (ref 3.8–10.6)

## 2016-11-29 LAB — RENAL FUNCTION PANEL
Albumin: 2.4 g/dL — ABNORMAL LOW (ref 3.5–5.0)
Anion gap: 11 (ref 5–15)
BUN: 36 mg/dL — AB (ref 6–20)
CALCIUM: 7.9 mg/dL — AB (ref 8.9–10.3)
CO2: 25 mmol/L (ref 22–32)
CREATININE: 4.24 mg/dL — AB (ref 0.61–1.24)
Chloride: 93 mmol/L — ABNORMAL LOW (ref 101–111)
GFR calc Af Amer: 16 mL/min — ABNORMAL LOW (ref >60)
GFR calc non Af Amer: 14 mL/min — ABNORMAL LOW (ref >60)
GLUCOSE: 422 mg/dL — AB (ref 65–99)
PHOSPHORUS: 1.1 mg/dL — AB (ref 2.5–4.6)
Potassium: 4.4 mmol/L (ref 3.5–5.1)
Sodium: 129 mmol/L — ABNORMAL LOW (ref 135–145)

## 2016-11-29 LAB — AEROBIC CULTURE  (SUPERFICIAL SPECIMEN): GRAM STAIN: NONE SEEN

## 2016-11-29 LAB — GLUCOSE, CAPILLARY
GLUCOSE-CAPILLARY: 401 mg/dL — AB (ref 65–99)
GLUCOSE-CAPILLARY: 111 mg/dL — AB (ref 65–99)
GLUCOSE-CAPILLARY: 27 mg/dL — AB (ref 65–99)
GLUCOSE-CAPILLARY: 122 mg/dL — AB (ref 65–99)
Glucose-Capillary: 451 mg/dL — ABNORMAL HIGH (ref 65–99)
Glucose-Capillary: 164 mg/dL — ABNORMAL HIGH (ref 65–99)
Glucose-Capillary: 105 mg/dL — ABNORMAL HIGH (ref 65–99)
Glucose-Capillary: 291 mg/dL — ABNORMAL HIGH (ref 65–99)

## 2016-11-29 LAB — ANAEROBIC CULTURE

## 2016-11-29 LAB — AEROBIC CULTURE W GRAM STAIN (SUPERFICIAL SPECIMEN)

## 2016-11-29 SURGERY — ABDOMINAL AORTOGRAM W/LOWER EXTREMITY
Anesthesia: Moderate Sedation | Laterality: Left

## 2016-11-29 MED ORDER — IOPAMIDOL (ISOVUE-300) INJECTION 61%
INTRAVENOUS | Status: DC | PRN
  Administered 2016-11-29: 40 mL via INTRA_ARTERIAL

## 2016-11-29 MED ORDER — INSULIN GLARGINE 100 UNIT/ML ~~LOC~~ SOLN
5.0000 [IU] | Freq: Every day | SUBCUTANEOUS | Status: DC
  Administered 2016-11-29: 5 [IU] via SUBCUTANEOUS
  Filled 2016-11-29 (×2): qty 0.05

## 2016-11-29 MED ORDER — MIDAZOLAM HCL 5 MG/5ML IJ SOLN
INTRAMUSCULAR | Status: AC | PRN
  Administered 2016-11-29: 1 mg via INTRAVENOUS

## 2016-11-29 MED ORDER — CARVEDILOL 25 MG PO TABS
25.0000 mg | ORAL_TABLET | Freq: Two times a day (BID) | ORAL | Status: DC
  Administered 2016-11-29 – 2016-11-30 (×2): 25 mg via ORAL
  Filled 2016-11-29 (×4): qty 1

## 2016-11-29 MED ORDER — MIDAZOLAM HCL 5 MG/5ML IJ SOLN
INTRAMUSCULAR | Status: AC
  Filled 2016-11-29: qty 5

## 2016-11-29 MED ORDER — DEXTROSE 5 % IV SOLN
INTRAVENOUS | Status: DC
Start: 2016-11-29 — End: 2016-11-30
  Administered 2016-11-29: 17:00:00 via INTRAVENOUS

## 2016-11-29 MED ORDER — FENTANYL CITRATE (PF) 100 MCG/2ML IJ SOLN
INTRAMUSCULAR | Status: AC
  Filled 2016-11-29: qty 2

## 2016-11-29 MED ORDER — AMLODIPINE BESYLATE 10 MG PO TABS
10.0000 mg | ORAL_TABLET | Freq: Every day | ORAL | Status: DC
  Administered 2016-11-30: 10 mg via ORAL
  Filled 2016-11-29: qty 1

## 2016-11-29 MED ORDER — HEPARIN (PORCINE) IN NACL 2-0.9 UNIT/ML-% IJ SOLN
INTRAMUSCULAR | Status: AC
  Filled 2016-11-29: qty 1000

## 2016-11-29 MED ORDER — DEXTROSE 50 % IV SOLN
1.0000 | Freq: Once | INTRAVENOUS | Status: AC
  Administered 2016-11-29: 1 via INTRAVENOUS

## 2016-11-29 MED ORDER — SODIUM CHLORIDE 0.9 % IV SOLN: INTRAVENOUS | Status: DC

## 2016-11-29 MED ORDER — MORPHINE SULFATE (PF) 2 MG/ML IV SOLN: 2.0000 mg | INTRAVENOUS | Status: DC | PRN

## 2016-11-29 MED ORDER — INSULIN ASPART 100 UNIT/ML ~~LOC~~ SOLN
2.0000 [IU] | Freq: Three times a day (TID) | SUBCUTANEOUS | Status: DC
  Administered 2016-11-30: 2 [IU] via SUBCUTANEOUS
  Filled 2016-11-29 (×2): qty 1

## 2016-11-29 MED ORDER — DEXTROSE 50 % IV SOLN
INTRAVENOUS | Status: AC
  Administered 2016-11-29: 1 via INTRAVENOUS
  Filled 2016-11-29: qty 50

## 2016-11-29 MED ORDER — LIDOCAINE HCL (PF) 1 % IJ SOLN
INTRAMUSCULAR | Status: AC
  Filled 2016-11-29: qty 30

## 2016-11-29 MED ORDER — INSULIN GLARGINE 100 UNIT/ML ~~LOC~~ SOLN
7.0000 [IU] | Freq: Every day | SUBCUTANEOUS | Status: DC
  Filled 2016-11-29: qty 0.07

## 2016-11-29 SURGICAL SUPPLY — 12 items
CATH ANGIO 5F 100CM .035 PIG (CATHETERS) ×2 IMPLANT
CATH IMAGER II S 5FR 65CM (MISCELLANEOUS) ×2 IMPLANT
COVER PROBE U/S 5X48 (MISCELLANEOUS) ×2 IMPLANT
DEVICE CLOSURE MYNXGRIP 5F (Vascular Products) ×2 IMPLANT
GLIDEWIRE ANGLED SS 035X260CM (WIRE) ×2 IMPLANT
NDL ENTRY 21GA 7CM ECHOTIP (NEEDLE) IMPLANT
NEEDLE ENTRY 21GA 7CM ECHOTIP (NEEDLE) ×4 IMPLANT
PACK ANGIOGRAPHY (CUSTOM PROCEDURE TRAY) ×2 IMPLANT
SET INTRO CAPELLA COAXIAL (SET/KITS/TRAYS/PACK) ×2 IMPLANT
SHEATH BRITE TIP 5FRX11 (SHEATH) ×2 IMPLANT
TUBING CONTRAST HIGH PRESS 72 (TUBING) ×2 IMPLANT
WIRE J 3MM .035X145CM (WIRE) ×2 IMPLANT

## 2016-11-29 NOTE — Progress Notes (Signed)
Chaplain rounding unit visit with pt, but pt was not in the Rm at the time of this visit. CH will follow up with pt to provide pastoral care as needed.   11/29/16 1428  Clinical Encounter Type  Visited With Patient  Visit Type Initial  Referral From Chaplain  Spiritual Encounters  Spiritual Needs Other (Comment)  Advance Directives (For Healthcare)  Does Patient Have a Medical Advance Directive? No  Would patient like information on creating a medical advance directive? No - Patient declined

## 2016-11-29 NOTE — Progress Notes (Signed)
[facility]  ROUNDING NOTE   Subjective:   Seen and examined on hemodialysis. Tolerating treatment well.     HEMODIALYSIS FLOWSHEET:  Blood Flow Rate (mL/min): 350 mL/min Arterial Pressure (mmHg): -170 mmHg Venous Pressure (mmHg): 150 mmHg Transmembrane Pressure (mmHg): 50 mmHg Ultrafiltration Rate (mL/min): 330 mL/min Dialysate Flow Rate (mL/min): 600 ml/min Conductivity: Machine : 14 Conductivity: Machine : 14 Dialysis Fluid Bolus: Normal Saline Bolus Amount (mL): 250 mL Dialysate Change:  (3k 2.5ca)   Objective:  Vital signs in last 24 hours:  Temp:  [97.8 F (36.6 C)-98.6 F (37 C)] 98 F (36.7 C) (08/15 0954) Pulse Rate:  [67-80] 75 (08/15 1124) Resp:  [14-18] 16 (08/15 1124) BP: (116-157)/(60-86) 117/68 (08/15 1124) SpO2:  [98 %-100 %] 100 % (08/15 1124) Weight:  [56.7 kg (125 lb)-59.7 kg (131 lb 11.2 oz)] 59.7 kg (131 lb 11.2 oz) (08/15 0954)  Weight change:  Filed Weights   11/28/16 1211 11/29/16 0954  Weight: 56.7 kg (125 lb) 59.7 kg (131 lb 11.2 oz)    Intake/Output: I/O last 3 completed shifts: In: 1434 [P.O.:1194; NG/GT:240] Out: 0    Intake/Output this shift:  No intake/output data recorded.  Physical Exam: General: Chronically ill appearing  Head: Normocephalic, atraumatic. Moist oral mucosal membranes  Eyes: Anicteric  Neck: Supple, trachea midline  Lungs:  Clear to auscultation, normal effort  Heart: S1S2 no rubs  Abdomen:  Soft, nontender, bowel sounds present  Extremities: Right foot in boot, Left foot wrapped   Neurologic: Awake, alert, following commands  Skin: No lesions  Access: Left femoral permcath    Basic Metabolic Panel:  Recent Labs Lab 11/23/16 1044 11/24/16 0500 11/24/16 1007 11/25/16 0416 11/26/16 1036 11/27/16 0415 11/29/16 0957  NA 136 138  --  138  --  134* 129*  K 3.9 3.3*  --  3.5  --  4.4 4.4  CL 98* 100*  --  99*  --  95* 93*  CO2 29 29  --  30  --  31 25  GLUCOSE 214* 71  --  141*  --   28* 422*  BUN 13 19  --  10  --  35* 36*  CREATININE 3.09* 3.50*  --  2.58*  --  4.33* 4.24*  CALCIUM 8.0* 8.1*  --  7.8*  --  8.4* 7.9*  PHOS  --   --  <1.0*  --  1.3* 1.7* 1.1*    Liver Function Tests:  Recent Labs Lab 11/23/16 1044 11/27/16 0415 11/29/16 0957  AST 172*  --   --   ALT 41  --   --   ALKPHOS 138*  --   --   BILITOT 0.6  --   --   PROT 7.4  --   --   ALBUMIN 2.8* 2.7* 2.4*   No results for input(s): LIPASE, AMYLASE in the last 168 hours. No results for input(s): AMMONIA in the last 168 hours.  CBC:  Recent Labs Lab 11/23/16 1044 11/24/16 0500 11/25/16 0416 11/27/16 1404 11/29/16 0957  WBC 3.5* 4.0 5.6 8.1 6.5  NEUTROABS 2.3  --   --   --   --   HGB 11.8* 11.3* 11.7* 11.0* 10.1*  HCT 36.4* 34.5* 36.4* 34.6* 31.0*  MCV 91.4 90.9 90.4 90.3 89.3  PLT 185 180 192 202 217    Cardiac Enzymes: No results for input(s): CKTOTAL, CKMB, CKMBINDEX, TROPONINI in the last 168 hours.  BNP: Invalid input(s): POCBNP  CBG:  Recent Labs Lab  11/28/16 1422 11/28/16 1700 11/28/16 2123 11/29/16 0750 11/29/16 0939  GLUCAP 174* 177* 176* 451* 401*    Microbiology: Results for orders placed or performed during the hospital encounter of 11/23/16  MRSA PCR Screening     Status: Abnormal   Collection Time: 11/23/16  6:37 PM  Result Value Ref Range Status   MRSA by PCR POSITIVE (A) NEGATIVE Final    Comment:        The GeneXpert MRSA Assay (FDA approved for NASAL specimens only), is one component of a comprehensive MRSA colonization surveillance program. It is not intended to diagnose MRSA infection nor to guide or monitor treatment for MRSA infections. RESULT CALLED TO, READ BACK BY AND VERIFIED WITH: STACEY CLAY 11/23/16 @ 2054  MLK   Anaerobic culture     Status: None   Collection Time: 11/24/16  7:19 PM  Result Value Ref Range Status   Specimen Description BONE LEFT FOOT 5TH RAY  Final   Special Requests NONE  Final   Culture   Final    FEW  BACTEROIDES FRAGILIS BETA LACTAMASE POSITIVE Performed at [facility], [address] [place], [place] [address]    Report Status 11/29/2016 FINAL  Final  Aerobic Culture (superficial specimen)     Status: None (Preliminary result)   Collection Time: 11/24/16  7:19 PM  Result Value Ref Range Status   Specimen Description BONE LEFT FOOT 5TH RAY  Final   Special Requests NONE  Final   Gram Stain NO WBC SEEN NO ORGANISMS SEEN   Final   Culture   Final    FEW PROTEUS MIRABILIS FEW STAPHYLOCOCCUS AUREUS SUSCEPTIBILITIES TO FOLLOW FOR ORGANISM 2 Performed at [facility], [address] [place], [place] [address]    Report Status PENDING  Incomplete   Organism ID, Bacteria PROTEUS MIRABILIS  Final      Susceptibility   Proteus mirabilis - MIC*    AMPICILLIN <=2 SENSITIVE Sensitive     CEFAZOLIN <=4 SENSITIVE Sensitive     CEFEPIME <=1 SENSITIVE Sensitive     CEFTAZIDIME <=1 SENSITIVE Sensitive     CEFTRIAXONE <=1 SENSITIVE Sensitive     CIPROFLOXACIN <=0.25 SENSITIVE Sensitive     GENTAMICIN <=1 SENSITIVE Sensitive     IMIPENEM 1 SENSITIVE Sensitive     TRIMETH/SULFA <=20 SENSITIVE Sensitive     AMPICILLIN/SULBACTAM <=2 SENSITIVE Sensitive     PIP/TAZO <=4 SENSITIVE Sensitive     * FEW PROTEUS MIRABILIS    Coagulation Studies: No results for input(s): LABPROT, INR in the last 72 hours.  Urinalysis: No results for input(s): COLORURINE, LABSPEC, PHURINE, GLUCOSEU, HGBUR, BILIRUBINUR, KETONESUR, PROTEINUR, UROBILINOGEN, NITRITE, LEUKOCYTESUR in the last 72 hours.  Invalid input(s): APPERANCEUR    Imaging: No results found.   Medications:   . [START ON 12/01/2016]  ceFAZolin (ANCEF) IV    .  ceFAZolin (ANCEF) IV 2 g (11/27/16 1755)   . aspirin  81 mg Oral Daily  . B-complex with vitamin C  1 tablet Oral Daily  . docusate sodium  100 mg Oral BID  . feeding supplement (NEPRO CARB STEADY)  237 mL Oral TID WC  . FLUoxetine  20 mg Oral Daily  . heparin  subcutaneous  5,000 Units Subcutaneous Q8H  . insulin aspart  0-15 Units Subcutaneous TID WC  . insulin aspart  0-5 Units Subcutaneous QHS  . levothyroxine  275 mcg Oral QAC breakfast  . loratadine  10 mg Oral Daily  . Melatonin  5 mg Oral QHS  . mirtazapine  15 mg Oral QHS  . polyethylene glycol  17 g Oral Daily  . pravastatin  10 mg Oral QHS  . saccharomyces boulardii  250 mg Oral Daily  . traZODone  100 mg Oral QHS   acetaminophen **OR** acetaminophen, albuterol, HYDROcodone-acetaminophen, morphine injection, ondansetron, oxyCODONE-acetaminophen, promethazine  Assessment/ Plan:  Marcus [name] is 61 y.o. black side effects with End Stage Renal Disease on hemodialysis MWF,  diabetes mellitus type 1, COPD, tobacco abuse, hypertension, anemia of chronic kidney disease, secondary hyperparathyroidism, history of CVA, prior episodes of DKA, who [facility] for evaluation of left fifth toe infection, s/p amputation left fifth toe 11/24/16 Dr. Alberteen Spindleline  [facility]  1.  ESRD on HD MWF: Hemodialysis treatment. Tolerating treatment well.   2. Anemia chronic kidney disease: hemoglobin 10.1 - holding EPO - Mircera as outpatient  3.  Secondary hyperparathyroidism: phosphorus 1.1, calcium low at 8.4 ( corrected at 9.4) - holding phos binders. May need to liberalize diet  4.  Hypertension: holding home medications.  5.  Left foot osteomyelitis. Patient status post left fifth toe amputation on 11/24/2016 Dr. Alberteen Spindleline Appreciate ID input.  -Cefazolin 2grams on Mondays and Fridays and 3grams on Wednesdays. for 4 weeks. End date 9/7.    LOS: 6 Marcus [name] 8/15/201811:30 AM

## 2016-11-29 NOTE — Progress Notes (Signed)
Pre hd 

## 2016-11-29 NOTE — Progress Notes (Signed)
HD completed without issue. Goal met. Patient tolerated well 

## 2016-11-29 NOTE — Op Note (Signed)
[facility]  Percutaneous Study/Intervention Procedural Note   Date of Surgery: 11/29/2016,6:14 PM  Surgeon:Harrie Cazarez, Latina Craver   Pre-operative Diagnosis: gangrene left foot associated with osteomyelitis  Post-operative diagnosis:  Same  Procedure(s) Performed:  1.  Abdominal aortogram  2.  Left lower extremity angiography third order catheter placement  3.  Minx closure right arteriotomy site   Anesthesia: Conscious sedation was administered by the interventional radiology RN under my direct supervision. IV Versed plus fentanyl were utilized. Continuous ECG, pulse oximetry and blood pressure was monitored throughout the entire procedure.  Conscious sedation was administered for a total of 37 minutes.  Sheath: 5 French right common femoral  Contrast: 40 cc   Fluoroscopy Time: 4.1 minutes  Indications:  Patient has developed osteomyelitis and gangrenous changes of the left foot. He is therefore undergoing angiography to ensure he has optimal perfusionfor wound healing.  Procedure:  GARRETTE CAINE a 61 y.o. male who was identified and appropriate procedural time out was performed.  The patient was then placed supine on the table and prepped and draped in the usual sterile fashion.  Ultrasound was used to evaluate the right common femoral artery.  It was echolucent and pulsatile indicating it is patent .  An ultrasound image was acquired for the permanent record.  A micropuncture needle was used to access the right common femoral artery under direct ultrasound guidance.  The microwire was then advanced under fluoroscopic guidance without difficulty followed by the micro-sheath.  A 0.035 J wire was advanced without resistance and a 5Fr sheath was placed.    Pigtail catheter was then advanced to the level of T12 and AP projection of the aorta was obtained. Pigtail catheter was then repositioned to above the bifurcation and RAO view of the pelvis was obtained. Stiff  angled Glidewire and a VCF catheter was then used across the bifurcation and the catheter was positioned in the distal external iliac artery.  LAO of the eft groin was then obtained. Wire was reintroduced and negotiated into the SFA and the catheter was advanced into the SFA. Distal runoff was then performed.  After review of the images the catheter was removed over wire and an RAO view of the groin was obtained. minx device was deployed without difficulty.   Findings:   Aortogram:  Widely patent renal arteries are nonvisualized there is diffuse disease noted but no hemodynamically significant lesions. The common iliac arteries demonstrate mild atherosclerotic disease bilaterally. The external iliac arteries are widely patent.  Right Lower Extremity:  The right common femoral is patent, as is the visualized portion of the profunda femoris.there are stents noted in the venous system with rapid filling of the venous system consistent with his AV graft in the right thigh which remains patent.  Left Lower Extremity:  The left common femoral profunda femoris and superficial femoral arteries and straight mild atherosclerotic changes there are no focal hemodynamically significant lesions. The trifurcation is patent and the anterior tibial is the dominant artery filling the foot however the posterior tibial remains patent as well. Pedal arch is visualized as digital vessels. The peroneal is patent but appears to occlude distally and does not contribute significantly to the foot.Tunneled catheter is noted in the left common femoral vein and it is in good position with its tip in the proximal inferior vena cava.  It should be noted that injection of contrast at the common femoral level takes greater than 20 seconds to get down to the foot is  markedly delayed and consistent with very poor cardiac output.  SUMMARY: the patient does not have hemodynamically significant atherosclerotic occlusive disease. He does appear  to have very poor cardiac output and this may be contributing to his poor wound healing   Disposition: Patient was taken to the recovery room in stable condition having tolerated the procedure well.  Earl LitesGregory Naziyah Tieszen 11/29/2016,6:14 PM

## 2016-11-29 NOTE — Progress Notes (Addendum)
Per Dr. Allena KatzPatel okay to discontinue D5 that was started in vascular procedure. Okay per MD to give aspirin as well. Pt is now has a diet so he should be able to keep his sugar up.

## 2016-11-29 NOTE — Progress Notes (Signed)
Inpatient Diabetes Program Recommendations  AACE/ADA: New Consensus Statement on Inpatient Glycemic Control (2015)  Target Ranges:  Prepandial:   less than 140 mg/dL      Peak postprandial:   less than 180 mg/dL (1-2 hours)      Critically ill patients:  140 - 180 mg/dL   Lab Results  Component Value Date   GLUCAP 401 (H) 11/29/2016   HGBA1C 8.6 (H) 03/27/2014    Review of Glycemic ControlResults for Marcus MuttersSTALEY, Terris O (MRN [id number]) as of 11/29/2016 10:11  Ref. Range 11/28/2016 04:49 11/28/2016 09:40 11/28/2016 11:36 11/28/2016 14:22 11/28/2016 17:00 11/28/2016 21:23 11/29/2016 07:50 11/29/2016 09:39  Glucose-Capillary Latest Ref Range: 65 - 99 mg/dL 562240 (H) 130280 (H) 865297 (H) 174 (H) 177 (H) 176 (H) 451 (H) 401 (H)  Diabetes [facility]:Type [facility] age 61[facility] (recent visiton 11/16/16)  Outpatient DMmeds: Lantus 5 units qhs Novolog 1-5 units tid with meals (based on pre-meal blood sugar and amount to be eaten)  Current Insulin Orders: Novolog Sensitive Correction Scale/ SSI (0-9 units) TID [facility]  Inpatient Diabetes Program Recommendations:   Please consider the following insulin adjustments:  1. Start back Lantus at 5 units daily (start this AM) (patient's home dose)  2. Start low dose Novolog Meal Coverage once patient resumes diet today: Novolog 2 units TID with meals (hold if pt eats <50% of meal)  Text page sent to MD.    Thanks, Beryl MeagerJenny Novis League, RN, BC-ADM Inpatient Diabetes Coordinator Pager [phone] (8a-5p)

## 2016-11-29 NOTE — Progress Notes (Signed)
[facility] - [place] [facility]   PATIENT NAME: Marcus JacksonCharles Roen    MR#:  [id number]  DATE OF BIRTH:  Sep 20, 1955  SUBJECTIVE:   Patient is here due to left foot osteomyelitis, status post fifth ray resection per podiatry. She had hemodialysis today, going for angiogram later today. No other acute complaints or events overnight.  REVIEW OF SYSTEMS:    Review of Systems  Constitutional: Negative for chills and fever.  HENT: Negative for congestion and tinnitus.   Eyes: Negative for blurred vision and double vision.  Respiratory: Negative for cough, shortness of breath and wheezing.   Cardiovascular: Negative for chest pain, orthopnea and PND.  Gastrointestinal: Negative for abdominal pain, diarrhea, nausea and vomiting.  Genitourinary: Negative for dysuria and hematuria.  Neurological: Negative for dizziness, sensory change and focal weakness.  All other systems reviewed and are negative.   Nutrition: Carb control Tolerating Diet: Yes Tolerating PT: Await Eval.   DRUG ALLERGIES:   Allergies  Allergen Reactions  . Penicillins Other (See Comments)    Unknown reaction    VITALS:  Blood pressure 125/62, pulse 70, temperature 98.5 F (36.9 C), temperature source Oral, resp. rate 12, height 5\' 9"  (1.753 m), weight 59.4 kg (131 lb), SpO2 100 %.  PHYSICAL EXAMINATION:   Physical Exam  GENERAL:  61 y.o.-year-old patient lying in bed in no acute distress.  EYES: Pupils equal, round, reactive to light and accommodation. No scleral icterus. Extraocular muscles intact.  HEENT: Head atraumatic, normocephalic. Oropharynx and nasopharynx clear.  NECK:  Supple, no jugular venous distention. No thyroid enlargement, no tenderness.  LUNGS: Normal breath sounds bilaterally, no wheezing, rales, rhonchi. No use of accessory muscles of respiration.  CARDIOVASCULAR: S1, S2 normal. No murmurs, rubs, or gallops.  ABDOMEN: Soft, nontender, nondistended. Bowel sounds present. No  organomegaly or mass.  EXTREMITIES: No cyanosis, clubbing or edema b/l.   Left lower ext. Dressing in place with no acute drainage.  NEUROLOGIC: Cranial nerves II through XII are intact. No focal Motor or sensory deficits b/l.  Globally weak. PSYCHIATRIC: The patient is alert and oriented x 3.  SKIN: No obvious rash, lesion, or ulcer.    LABORATORY PANEL:   CBC  Recent Labs Lab 11/29/16 0957  WBC 6.5  HGB 10.1*  HCT 31.0*  PLT 217   ------------------------------------------------------------------------------------------------------------------  Chemistries   Recent Labs Lab 11/23/16 1044  11/29/16 0957  NA 136  < > 129*  K 3.9  < > 4.4  CL 98*  < > 93*  CO2 29  < > 25  GLUCOSE 214*  < > 422*  BUN 13  < > 36*  CREATININE 3.09*  < > 4.24*  CALCIUM 8.0*  < > 7.9*  AST 172*  --   --   ALT 41  --   --   ALKPHOS 138*  --   --   BILITOT 0.6  --   --   < > = values in this interval not displayed. ------------------------------------------------------------------------------------------------------------------  Cardiac Enzymes No results for input(s): TROPONINI in the last 168 hours. ------------------------------------------------------------------------------------------------------------------  RADIOLOGY:  No results found.   ASSESSMENT AND PLAN:   61 year old male with past medical history of end-stage renal disease on hemodialysis, diabetes, hypothyroidism, GERD, essential hypertension, COPD, history of previous CVA, history of previous DVT who presents to the hospital due to left foot osteomyelitis.  1. Osteomyelitis of the left fifth toe-status post fifth ray amputation per podiatry. -Wound cultures are consistent with staph and Proteus.  Initially was on broad-spectrum antibiotics but now on IV Ancef. -Appreciate podiatry and infectious disease input. -Going for angiogram later today.  2. End-stage renal disease on hemodialysis- cont. Dialysis as per  Nephrology.  - cont. Dialysis on MWF.   3. Essential HTN - cont. Norvasc, Coreg.   4. Hypothyroidism - cont. Synthroid.   5. Diabetes type 2 without complication-blood sugars have been somewhat labile. -Appreciate diabetes coronary input. We will resume Lantus, and low-dose NovoLog with meals. Continue sliding scale for now.  6. Hyperlipidemia-continue Pravachol.  7. depression-continue Prozac.  All the records are reviewed and case discussed with Care Management/Social Worker. Management plans discussed with the patient, family and they are in agreement.  CODE STATUS: Full code  DVT Prophylaxis: Hep. SQ  TOTAL TIME TAKING CARE OF THIS PATIENT: 30 minutes.   POSSIBLE D/C IN 1-2 DAYS, DEPENDING ON CLINICAL CONDITION.   Houston Siren M.D on 11/29/2016 at 1:52 PM  Between 7am to 6pm - Pager - [phone]  After 6pm go to [email or web address] - Social research officer, government  [facility] [place] [facility]  Office  [phone]  CC: Primary care physician; Lindwood Qua, MD

## 2016-11-29 NOTE — Progress Notes (Signed)
HD initiated via L Fem HD cath without issue. Dressing changed this am already. Clean dry and intact. Labs sent per order. Patient currently has no complaints.

## 2016-11-29 NOTE — Progress Notes (Signed)
Notified Dr. Cherlynn KaiserSainani that blood glucose was 415. Per MD give sliding scale coverage of 15 units even though he is NPO.

## 2016-11-30 ENCOUNTER — Encounter: Payer: Self-pay | Admitting: Vascular Surgery

## 2016-11-30 LAB — GLUCOSE, CAPILLARY
GLUCOSE-CAPILLARY: 59 mg/dL — AB (ref 65–99)
GLUCOSE-CAPILLARY: 86 mg/dL (ref 65–99)
Glucose-Capillary: 31 mg/dL — CL (ref 65–99)
Glucose-Capillary: 126 mg/dL — ABNORMAL HIGH (ref 65–99)

## 2016-11-30 MED ORDER — INSULIN GLARGINE 100 UNIT/ML ~~LOC~~ SOLN: 5.0000 [IU] | Freq: Every day | SUBCUTANEOUS | 11 refills | Status: AC

## 2016-11-30 MED ORDER — CEFAZOLIN SODIUM-DEXTROSE 2-4 GM/100ML-% IV SOLN: 2.0000 g | INTRAVENOUS | 0 refills | Status: AC | PRN

## 2016-11-30 MED ORDER — HYDROCODONE-ACETAMINOPHEN 5-325 MG PO TABS: 1.0000 | ORAL_TABLET | Freq: Four times a day (QID) | ORAL | 0 refills | Status: AC | PRN

## 2016-11-30 MED ORDER — METRONIDAZOLE 500 MG PO TABS: 500.0000 mg | ORAL_TABLET | Freq: Three times a day (TID) | ORAL | 0 refills | Status: AC

## 2016-11-30 MED ORDER — DEXTROSE 5 % IV SOLN: 3.0000 g | INTRAVENOUS | Status: AC

## 2016-11-30 NOTE — Clinical Social Work Note (Signed)
Patient discharged today to [facility]. CSW notified Debbie at [facility] and sent discharge information. Patient sent via EMS to [facility]. York SpanielMonica Orra Nolde MSW,LCSW [phone]

## 2016-11-30 NOTE — Progress Notes (Signed)
1 Day Post-Op   Subjective/Chief Complaint: Patient seen. No complaints with the foot.   Objective: Vital signs in last 24 hours: Temp:  [97.7 F (36.5 C)-98.5 F (36.9 C)] 97.9 F (36.6 C) (08/16 0517) Pulse Rate:  [61-80] 66 (08/16 0517) Resp:  [12-16] 16 (08/16 0517) BP: (98-151)/(62-127) 149/90 (08/16 0517) SpO2:  [98 %-100 %] 100 % (08/16 0517) Weight:  [59.4 kg (131 lb)-59.7 kg (131 lb 11.2 oz)] 59.4 kg (131 lb) (08/15 1255) Last BM Date: 11/28/16  Intake/Output from previous day: 08/15 0701 - 08/16 0700 In: 340 [P.O.:240; IV Piggyback:100] Out: 508  Intake/Output this shift: No intake/output data recorded.  The bandages dry and intact. Upon removal there is still moderate to heavy bleeding on the bandaging. The incision is well coapted with no signs of purulence on the bandage.  Lab Results:   Recent Labs  11/27/16 1404 11/29/16 0957  WBC 8.1 6.5  HGB 11.0* 10.1*  HCT 34.6* 31.0*  PLT 202 217   BMET  Recent Labs  11/29/16 0957  NA 129*  K 4.4  CL 93*  CO2 25  GLUCOSE 422*  BUN 36*  CREATININE 4.24*  CALCIUM 7.9*   PT/INR No results for input(s): LABPROT, INR in the last 72 hours. ABG No results for input(s): PHART, HCO3 in the last 72 hours.  Invalid input(s): PCO2, PO2  Studies/Results: No results found.  Anti-infectives: Anti-infectives    Start     Dose/Rate Route Frequency Ordered Stop   12/01/16 1800  ceFAZolin (ANCEF) 3 g in dextrose 5 % 50 mL IVPB     3 g 130 mL/hr over 30 Minutes Intravenous Every Fri (Hemodialysis) 11/27/16 1712     11/27/16 1800  ceFAZolin (ANCEF) IVPB 2g/100 mL premix     2 g 200 mL/hr over 30 Minutes Intravenous Once per day on Mon Wed 11/27/16 1710     11/24/16 1200  vancomycin (VANCOCIN) 500 mg in sodium chloride 0.9 % 100 mL IVPB  Status:  Discontinued     500 mg 100 mL/hr over 60 Minutes Intravenous Every M-W-F (Hemodialysis) 11/23/16 1545 11/28/16 0933   11/24/16 1000  ceFEPIme (MAXIPIME) 1 g in  dextrose 5 % 50 mL IVPB  Status:  Discontinued     1 g 100 mL/hr over 30 Minutes Intravenous Every 24 hours 11/23/16 1545 11/27/16 1707   11/23/16 1600  vancomycin (VANCOCIN) 500 mg in sodium chloride 0.9 % 100 mL IVPB     500 mg 100 mL/hr over 60 Minutes Intravenous  Once 11/23/16 1545 11/23/16 1835   11/23/16 1400  ceFEPIme (MAXIPIME) 1 g in dextrose 5 % 50 mL IVPB     1 g 100 mL/hr over 30 Minutes Intravenous  Once 11/23/16 1348 11/23/16 1905   11/23/16 1200  vancomycin (VANCOCIN) IVPB 1000 mg/200 mL premix     1,000 mg 200 mL/hr over 60 Minutes Intravenous  Once 11/23/16 1151 11/23/16 1256      Assessment/Plan: s/p Procedure(s): ABDOMINAL AORTOGRAM W/LOWER EXTREMITY (Left) Lower Extremity Intervention Assessment: Good progress status post debridement bone left foot   Plan: Betadine and sterile dressing change applied. Patient stable for discharge or transfer. We'll need dry sterile dressing changes 3 times a week. Follow-up outpatient in 1-2 weeks  LOS: 7 days    Marcus [name] 11/30/2016

## 2016-11-30 NOTE — Progress Notes (Signed)
[facility]  ROUNDING NOTE   Subjective:   Hemodialysis treatment yesterday. Tolerated treatment well. UF of  Angiogram yesterday  Objective:  Vital signs in last 24 hours:  Temp:  [97.7 F (36.5 C)-98.5 F (36.9 C)] 97.9 F (36.6 C) (08/16 0517) Pulse Rate:  [61-78] 66 (08/16 0517) Resp:  [12-16] 16 (08/16 0517) BP: (98-151)/(62-127) 149/90 (08/16 0517) SpO2:  [98 %-100 %] 100 % (08/16 0517) Weight:  [59.4 kg (131 lb)] 59.4 kg (131 lb) (08/15 1255)  Weight change: 3.039 kg (6 lb 11.2 oz) Filed Weights   11/28/16 1211 11/29/16 0954 11/29/16 1255  Weight: 56.7 kg (125 lb) 59.7 kg (131 lb 11.2 oz) 59.4 kg (131 lb)    Intake/Output: I/O last 3 completed shifts: In: 820 [P.O.:480; NG/GT:240; IV Piggyback:100] Out: 508 [Other:508]   Intake/Output this shift:  No intake/output data recorded.  Physical Exam: General: Chronically ill appearing  Head: Normocephalic, atraumatic. Moist oral mucosal membranes  Eyes: Anicteric  Neck: Supple, trachea midline  Lungs:  Clear to auscultation, normal effort  Heart: S1S2 no rubs  Abdomen:  Soft, nontender, bowel sounds present  Extremities: Right foot in boot, Left foot wrapped   Neurologic: Awake, alert, following commands  Skin: No lesions  Access: Left femoral permcath    Basic Metabolic Panel:  Recent Labs Lab 11/23/16 1044 11/24/16 0500 11/24/16 1007 11/25/16 0416 11/26/16 1036 11/27/16 0415 11/29/16 0957  NA 136 138  --  138  --  134* 129*  K 3.9 3.3*  --  3.5  --  4.4 4.4  CL 98* 100*  --  99*  --  95* 93*  CO2 29 29  --  30  --  31 25  GLUCOSE 214* 71  --  141*  --  28* 422*  BUN 13 19  --  10  --  35* 36*  CREATININE 3.09* 3.50*  --  2.58*  --  4.33* 4.24*  CALCIUM 8.0* 8.1*  --  7.8*  --  8.4* 7.9*  PHOS  --   --  <1.0*  --  1.3* 1.7* 1.1*    Liver Function Tests:  Recent Labs Lab 11/23/16 1044 11/27/16 0415 11/29/16 0957  AST 172*  --   --   ALT 41  --   --   ALKPHOS 138*  --    --   BILITOT 0.6  --   --   PROT 7.4  --   --   ALBUMIN 2.8* 2.7* 2.4*   No results for input(s): LIPASE, AMYLASE in the last 168 hours. No results for input(s): AMMONIA in the last 168 hours.  CBC:  Recent Labs Lab 11/23/16 1044 11/24/16 0500 11/25/16 0416 11/27/16 1404 11/29/16 0957  WBC 3.5* 4.0 5.6 8.1 6.5  NEUTROABS 2.3  --   --   --   --   HGB 11.8* 11.3* 11.7* 11.0* 10.1*  HCT 36.4* 34.5* 36.4* 34.6* 31.0*  MCV 91.4 90.9 90.4 90.3 89.3  PLT 185 180 192 202 217    Cardiac Enzymes: No results for input(s): CKTOTAL, CKMB, CKMBINDEX, TROPONINI in the last 168 hours.  BNP: Invalid input(s): POCBNP  CBG:  Recent Labs Lab 11/29/16 1726 11/29/16 1804 11/29/16 2127 11/30/16 0809 11/30/16 0845  GLUCAP 111* 122* 291* 59* 86    Microbiology: Results for orders placed or performed during the hospital encounter of 11/23/16  MRSA PCR Screening     Status: Abnormal   Collection Time: 11/23/16  6:37 PM  Result  Value Ref Range Status   MRSA by PCR POSITIVE (A) NEGATIVE Final    Comment:        The GeneXpert MRSA Assay (FDA approved for NASAL specimens only), is one component of a comprehensive MRSA colonization surveillance program. It is not intended to diagnose MRSA infection nor to guide or monitor treatment for MRSA infections. RESULT CALLED TO, READ BACK BY AND VERIFIED WITH: STACEY CLAY 11/23/16 @ 2054  MLK   Anaerobic culture     Status: None   Collection Time: 11/24/16  7:19 PM  Result Value Ref Range Status   Specimen Description BONE LEFT FOOT 5TH RAY  Final   Special Requests NONE  Final   Culture   Final    FEW BACTEROIDES FRAGILIS BETA LACTAMASE POSITIVE Performed at [facility], [address], [place], [place] [address]    Report Status 11/29/2016 FINAL  Final  Aerobic Culture (superficial specimen)     Status: None   Collection Time: 11/24/16  7:19 PM  Result Value Ref Range Status   Specimen Description BONE LEFT FOOT 5TH RAY   Final   Special Requests NONE  Final   Gram Stain   Final    NO WBC SEEN NO ORGANISMS SEEN Performed at [facility], [address], [place], [place] [address]    Culture FEW PROTEUS MIRABILIS FEW STAPHYLOCOCCUS AUREUS   Final   Report Status 11/29/2016 FINAL  Final   Organism ID, Bacteria PROTEUS MIRABILIS  Final   Organism ID, Bacteria STAPHYLOCOCCUS AUREUS  Final      Susceptibility   Proteus mirabilis - MIC*    AMPICILLIN <=2 SENSITIVE Sensitive     CEFAZOLIN <=4 SENSITIVE Sensitive     CEFEPIME <=1 SENSITIVE Sensitive     CEFTAZIDIME <=1 SENSITIVE Sensitive     CEFTRIAXONE <=1 SENSITIVE Sensitive     CIPROFLOXACIN <=0.25 SENSITIVE Sensitive     GENTAMICIN <=1 SENSITIVE Sensitive     IMIPENEM 1 SENSITIVE Sensitive     TRIMETH/SULFA <=20 SENSITIVE Sensitive     AMPICILLIN/SULBACTAM <=2 SENSITIVE Sensitive     PIP/TAZO <=4 SENSITIVE Sensitive     * FEW PROTEUS MIRABILIS   Staphylococcus aureus - MIC*    CIPROFLOXACIN >=8 RESISTANT Resistant     ERYTHROMYCIN >=8 RESISTANT Resistant     GENTAMICIN <=0.5 SENSITIVE Sensitive     OXACILLIN 0.5 SENSITIVE Sensitive     TETRACYCLINE <=1 SENSITIVE Sensitive     VANCOMYCIN <=0.5 SENSITIVE Sensitive     TRIMETH/SULFA <=10 SENSITIVE Sensitive     CLINDAMYCIN <=0.25 SENSITIVE Sensitive     RIFAMPIN <=0.5 SENSITIVE Sensitive     Inducible Clindamycin NEGATIVE Sensitive     * FEW STAPHYLOCOCCUS AUREUS    Coagulation Studies: No results for input(s): LABPROT, INR in the last 72 hours.  Urinalysis: No results for input(s): COLORURINE, LABSPEC, PHURINE, GLUCOSEU, HGBUR, BILIRUBINUR, KETONESUR, PROTEINUR, UROBILINOGEN, NITRITE, LEUKOCYTESUR in the last 72 hours.  Invalid input(s): APPERANCEUR    Imaging: No results found.   Medications:   . sodium chloride    . [START ON 12/01/2016]  ceFAZolin (ANCEF) IV    .  ceFAZolin (ANCEF) IV Stopped (11/29/16 1908)  . dextrose Stopped (11/29/16 1805)   . amLODipine  10 mg Oral  Daily  . aspirin  81 mg Oral Daily  . B-complex with vitamin C  1 tablet Oral Daily  . carvedilol  25 mg Oral BID WC  . docusate sodium  100 mg Oral BID  .  feeding supplement (NEPRO CARB STEADY)  237 mL Oral TID WC  . FLUoxetine  20 mg Oral Daily  . heparin subcutaneous  5,000 Units Subcutaneous Q8H  . insulin aspart  0-15 Units Subcutaneous TID WC  . insulin aspart  0-5 Units Subcutaneous QHS  . insulin aspart  2 Units Subcutaneous TID WC  . insulin glargine  5 Units Subcutaneous QHS  . levothyroxine  275 mcg Oral QAC breakfast  . loratadine  10 mg Oral Daily  . Melatonin  5 mg Oral QHS  . mirtazapine  15 mg Oral QHS  . polyethylene glycol  17 g Oral Daily  . pravastatin  10 mg Oral QHS  . saccharomyces boulardii  250 mg Oral Daily  . traZODone  100 mg Oral QHS   acetaminophen **OR** acetaminophen, albuterol, HYDROcodone-acetaminophen, morphine injection, ondansetron, oxyCODONE-acetaminophen, promethazine  Assessment/ Plan:  Marcus [name] is 61 y.o. black side effects with End Stage Renal Disease on hemodialysis MWF,  diabetes mellitus type 1, COPD, tobacco abuse, hypertension, anemia of chronic kidney disease, secondary hyperparathyroidism, history of CVA, prior episodes of DKA, who [facility] for evaluation of left fifth toe infection, s/p amputation left fifth toe 11/24/16 Dr. Alberteen Spindleline  [facility] permc[facility]  1.  ESRD on HD MWF: Continue MWF schedule.   2. Anemia chronic kidney disease: hemoglobin 10.1 - holding EPO - Mircera as outpatient  3.  Secondary hyperparathyroidism: phosphorus 1.1, calcium low at 8.4 ( corrected at 9.4) - holding phos binders. Liberalized diet  4.  Hypertension: holding home medications.  5.  Left foot osteomyelitis. Patient status post left fifth toe amputation on 11/24/2016 Dr. Alberteen Spindleline Appreciate ID input.  -Cefazolin 2grams on Mondays and Fridays and 3grams on Wednesdays. for 4  weeks. End date 9/7.    LOS: 7 Marcus [name] 8/16/201810:35 AM

## 2016-11-30 NOTE — Progress Notes (Signed)
Patient just left via EMS to [facility]

## 2016-11-30 NOTE — Progress Notes (Signed)
[facility] called to Rosey Batheresa [facility].  EMS notified to transport the patient

## 2016-11-30 NOTE — Progress Notes (Signed)
Inpatient Diabetes Program Recommendations  AACE/ADA: New Consensus Statement on Inpatient Glycemic Control (2015)  Target Ranges:  Prepandial:   less than 140 mg/dL      Peak postprandial:   less than 180 mg/dL (1-2 hours)      Critically ill patients:  140 - 180 mg/dL   Lab Results  Component Value Date   GLUCAP 291 (H) 11/29/2016   HGBA1C 8.6 (H) 03/27/2014    Review of Glycemic Control      Fasting CBG mg/dl  Diabetes history:Type [facility]- [facility] seen on 10/23/16  Outpatient Diabetes medications: Reports Lantus 5 units qhs, Novolog 1-5 units tid with meals  Current orders for Inpatient glycemic control: Lantus 5 units qhs, Novolog 0-15 units tid, Novolog 0-5 units qhs, Novolog 2 units tid with meals  Inpatient Diabetes Program Recommendations:   Please consider d/c Novolog 0-15 units and order Novolog 09 units tid- sever low blood sugar yesterday likely as a result of Novolog 15 units.  Consider Lantus 7 units qam (up from 5 units qam)  Susette RacerJulie Kamaree Wheatley, RN, BA, [place]HA, CDE Diabetes Coordinator Inpatient Diabetes Program  [phone] (Team Pager) [phone] [facility] Office) 11/30/2016 7:17 AM

## 2016-11-30 NOTE — Discharge Summary (Addendum)
[facility] [place] [facility]   PATIENT NAME: Marcus JacksonCharles [name]    MR#:  [id number]  DATE OF BIRTH:  Sep 12, 1955  DATE OF ADMISSION:  11/23/2016 ADMITTING PHYSICIAN: Alford Highlandichard Wieting, MD  DATE OF DISCHARGE: 11/30/2016  PRIMARY CARE PHYSICIAN: Lindwood QuaHoffman, Byron, MD    ADMISSION DIAGNOSIS:  Osteomyelitis, unspecified site, unspecified type (HCC) [M86.9]  DISCHARGE DIAGNOSIS:  Active Problems:   Osteomyelitis (HCC)   Pressure injury of skin   SECONDARY DIAGNOSIS:   Past Medical History:  Diagnosis Date  . Cataract   . CHF (congestive heart failure) (HCC)   . Chronic pain   . Cocaine abuse    past history  . COPD (chronic obstructive pulmonary disease) (HCC)   . CVA (cerebrovascular accident) (HCC)    hx CVA x 2  . Depression   . Diabetes mellitus (HCC)    Sees Dr. Reather LittlerAjay Kumar [phone] [phone] -- fasting blood 70-110s  . DKA (diabetic ketoacidosis) (HCC)    severe, Dec 2011  . DVT (deep venous thrombosis) (HCC)   . ESRD (end stage renal disease) on dialysis (HCC)    hd - [facility] [place]  . GERD (gastroesophageal reflux disease)   . GI bleed    2006, mallory-weiss tear  . Goiter    by CT Apr 2013  . Hemodialysis patient (HCC)    M, W, F  . History of blood transfusion   . HTN (hypertension)    sees Dr. Ardelle ParkHaque, [place]  . Hyperlipidemia   . Hypothyroidism   . Insomnia   . Neuromuscular disorder (HCC)    diabetic neuropathy  . Occlusion of carotid stent [facility](HCC)    Per patients nursing aid  . On home oxygen therapy    2l nasal cannula at night  . Pneumonia    hx of  . Pulmonary edema    nov 2011, dry wt decreased  . Tobacco abuse    history of    HOSPITAL COURSE:   61 year old male with past medical history of end-stage renal disease on hemodialysis, diabetes, hypothyroidism, GERD, essential hypertension, COPD, history of previous CVA, history of previous DVT who presents to the hospital due to left foot osteomyelitis.  1. Osteomyelitis of the  left fifth toe-status post fifth ray amputation per podiatry. -Wound cultures are consistent with staph and Proteus and anaerobic culture is growing bacteroides. Initially was on broad-spectrum antibiotics but now on IV Ancef, Flagyl. -appreciate infectious disease and podiatry input. Patient is now being discharged on IV Ancef at dialysis, oral Flagyl with stop date being 12/22/2016. -Patient had angiogram yesterday which showed no significant atherosclerotic disease.  2. End-stage renal disease on hemodialysis- cont. Dialysis as per Nephrology.  - cont. Dialysis on MWF.   3. Essential HTN - pt. Will cont. Norvasc, Coreg.   4. Hypothyroidism - pt,. Will cont. Synthroid.   5. Diabetes type 2 without complication- pt. Will resume his Lantus and Novolog with meals upon discharge. His Lantus dose has been adjusted and lowered to 5 units at bedtime.  - further adjustment to his diabetic regimen can be done as an outpatient.  6. Hyperlipidemia- pt. Will continue Pravachol.  7. depression- pt. Will continue Prozac.  Pt. Is being discharged to SNF.    DISCHARGE CONDITIONS:   Stable.   CONSULTS OBTAINED:  Treatment Team:  Mady HaagensenLateef, Munsoor, MD Schnier, Latina CraverGregory G, MD Linus Galasline, Todd, DPM Mick SellFitzgerald, David P, MD  DRUG ALLERGIES:   Allergies  Allergen Reactions  . Penicillins Other (See Comments)  Unknown reaction    DISCHARGE MEDICATIONS:   Allergies as of 11/30/2016      Reactions   Penicillins Other (See Comments)   Unknown reaction      Medication List    TAKE these medications   amLODipine 10 MG tablet Commonly known as:  NORVASC Take 1 tablet (10 mg total) by mouth at bedtime.   aspirin 81 MG chewable tablet Chew 81 mg by mouth daily.   b complex vitamins tablet Take 1 tablet by mouth daily.   calcium acetate 667 MG capsule Commonly known as:  PHOSLO Take by mouth 2 (two) times daily.   carvedilol 12.5 MG tablet Commonly known as:  COREG Take 25 mg by  mouth 2 (two) times daily with a meal.   ceFAZolin 2-4 GM/100ML-% IVPB Commonly known as:  ANCEF Inject 100 mLs (2 g total) into the vein every dialysis.   ceFAZolin 3 g in dextrose 5 % 50 mL Inject 3 g into the vein every Friday with hemodialysis.   cetirizine 10 MG tablet Commonly known as:  ZYRTEC Take 10 mg by mouth daily.   docusate sodium 100 MG capsule Commonly known as:  COLACE Take 1 capsule (100 mg total) by mouth 2 (two) times daily.   FLUoxetine 20 MG capsule Commonly known as:  PROZAC Take 20 mg by mouth daily.   HYDROcodone-acetaminophen 5-325 MG tablet Commonly known as:  NORCO/VICODIN Take 1 tablet by mouth every 6 (six) hours as needed for moderate pain.   insulin aspart 100 UNIT/ML injection Commonly known as:  novoLOG Inject 5 Units into the skin 3 (three) times daily with meals. What changed:  how much to take   insulin glargine 100 UNIT/ML injection Commonly known as:  LANTUS Inject 0.05 mLs (5 Units total) into the skin at bedtime.   levothyroxine 125 MCG tablet Commonly known as:  SYNTHROID, LEVOTHROID Take 125 mcg by mouth daily before breakfast.   levothyroxine 150 MCG tablet Commonly known as:  SYNTHROID, LEVOTHROID Take 150 mcg by mouth daily before breakfast.   Melatonin 3 MG Tabs Take 3 mg by mouth at bedtime.   metroNIDAZOLE 500 MG tablet Commonly known as:  FLAGYL Take 1 tablet (500 mg total) by mouth 3 (three) times daily.   mirtazapine 15 MG tablet Commonly known as:  REMERON Take 15 mg by mouth at bedtime.   NUTRITIONAL SHAKE PO Take 1 Bottle by mouth 3 (three) times daily with meals.   ondansetron 4 MG tablet Commonly known as:  ZOFRAN Take 4 mg by mouth every 6 (six) hours as needed for nausea or vomiting.   PHENADOZ 25 MG suppository Generic drug:  promethazine Place 25 mg rectally every 8 (eight) hours as needed for nausea or vomiting.   polyethylene glycol packet Commonly known as:  MIRALAX / GLYCOLAX Take 17 g  by mouth daily.   pravastatin 10 MG tablet Commonly known as:  PRAVACHOL Take 1 tablet (10 mg total) by mouth at bedtime.   saccharomyces boulardii 250 MG capsule Commonly known as:  FLORASTOR Take 250 mg by mouth daily.   traZODone 100 MG tablet Commonly known as:  DESYREL Take 100 mg by mouth at bedtime.   VENTOLIN HFA 108 (90 Base) MCG/ACT inhaler Generic drug:  albuterol Inhale 2 puffs into the lungs every 4 (four) hours as needed for wheezing or shortness of breath.         DISCHARGE INSTRUCTIONS:   DIET:  Cardiac diet and Diabetic diet  DISCHARGE CONDITION:  Stable  ACTIVITY:  Activity as tolerated  OXYGEN:  Home Oxygen: No.   Oxygen Delivery: room air  DISCHARGE LOCATION:  nursing home   If you experience worsening of your admission symptoms, develop shortness of breath, life threatening emergency, suicidal or homicidal thoughts you must seek medical attention immediately by calling 911 or calling your MD immediately  if symptoms less severe.  You Must read complete instructions/literature along with all the possible adverse reactions/side effects for all the Medicines you take and that have been prescribed to you. Take any new Medicines after you have completely understood and accpet all the possible adverse reactions/side effects.   Please note  You were cared for by a hospitalist during your hospital stay. If you have any questions about your discharge medications or the care you received while you were in the hospital after you are discharged, you can call the unit and asked to speak with the hospitalist on call if the hospitalist that took care of you is not available. Once you are discharged, your primary care physician will handle any further medical issues. Please note that NO REFILLS for any discharge medications will be authorized once you are discharged, as it is imperative that you return to your primary care physician (or establish a relationship  with a primary care physician if you do not have one) for your aftercare needs so that they can reassess your need for medications and monitor your lab values.     Today   No acute events overnight. Afebrile.Being discharged to skilled nursing facility for further care. Patient to get IV antibiotics at dialysis.  VITAL SIGNS:  Blood pressure (!) 149/90, pulse 66, temperature 97.9 F (36.6 C), temperature source Oral, resp. rate 16, height 5\' 9"  (1.753 m), weight 59.4 kg (131 lb), SpO2 100 %.  I/O:    Intake/Output Summary (Last 24 hours) at 11/30/16 1152 Last data filed at 11/30/16 0500  Gross per 24 hour  Intake              340 ml  Output              508 ml  Net             -168 ml    PHYSICAL EXAMINATION:   GENERAL:  61 y.o.-year-old patient lying in bed in no acute distress.  EYES: Pupils equal, round, reactive to light and accommodation. No scleral icterus. Extraocular muscles intact.  HEENT: Head atraumatic, normocephalic. Oropharynx and nasopharynx clear.  NECK:  Supple, no jugular venous distention. No thyroid enlargement, no tenderness.  LUNGS: Normal breath sounds bilaterally, no wheezing, rales, rhonchi. No use of accessory muscles of respiration.  CARDIOVASCULAR: S1, S2 normal. No murmurs, rubs, or gallops.  ABDOMEN: Soft, nontender, nondistended. Bowel sounds present. No organomegaly or mass.  EXTREMITIES: No cyanosis, clubbing or edema b/l.   Left lower ext. Dressing in place with no acute drainage.  NEUROLOGIC: Cranial nerves II through XII are intact. No focal Motor or sensory deficits b/l.  Globally weak. PSYCHIATRIC: The patient is alert and oriented x 3.  SKIN: No obvious rash, lesion, or ulcer.   Left groin perm-cath in place.   DATA REVIEW:   CBC  Recent Labs Lab 11/29/16 0957  WBC 6.5  HGB 10.1*  HCT 31.0*  PLT 217    Chemistries   Recent Labs Lab 11/29/16 0957  NA 129*  K 4.4  CL 93*  CO2 25  GLUCOSE 422*  BUN 36*  CREATININE  4.24*  CALCIUM 7.9*    Cardiac Enzymes No results for input(s): TROPONINI in the last 168 hours.  Microbiology Results  Results for orders placed or performed during the hospital encounter of 11/23/16  MRSA PCR Screening     Status: Abnormal   Collection Time: 11/23/16  6:37 PM  Result Value Ref Range Status   MRSA by PCR POSITIVE (A) NEGATIVE Final    Comment:        The GeneXpert MRSA Assay (FDA approved for NASAL specimens only), is one component of a comprehensive MRSA colonization surveillance program. It is not intended to diagnose MRSA infection nor to guide or monitor treatment for MRSA infections. RESULT CALLED TO, READ BACK BY AND VERIFIED WITH: STACEY CLAY 11/23/16 @ 2054  MLK   Anaerobic culture     Status: None   Collection Time: 11/24/16  7:19 PM  Result Value Ref Range Status   Specimen Description BONE LEFT FOOT 5TH RAY  Final   Special Requests NONE  Final   Culture   Final    FEW BACTEROIDES FRAGILIS BETA LACTAMASE POSITIVE Performed at [facility], [address], [place], [place] [address]    Report Status 11/29/2016 FINAL  Final  Aerobic Culture (superficial specimen)     Status: None   Collection Time: 11/24/16  7:19 PM  Result Value Ref Range Status   Specimen Description BONE LEFT FOOT 5TH RAY  Final   Special Requests NONE  Final   Gram Stain   Final    NO WBC SEEN NO ORGANISMS SEEN Performed at [facility], [address], [place], [place] [address]    Culture FEW PROTEUS MIRABILIS FEW STAPHYLOCOCCUS AUREUS   Final   Report Status 11/29/2016 FINAL  Final   Organism ID, Bacteria PROTEUS MIRABILIS  Final   Organism ID, Bacteria STAPHYLOCOCCUS AUREUS  Final      Susceptibility   Proteus mirabilis - MIC*    AMPICILLIN <=2 SENSITIVE Sensitive     CEFAZOLIN <=4 SENSITIVE Sensitive     CEFEPIME <=1 SENSITIVE Sensitive     CEFTAZIDIME <=1 SENSITIVE Sensitive     CEFTRIAXONE <=1 SENSITIVE Sensitive     CIPROFLOXACIN <=0.25  SENSITIVE Sensitive     GENTAMICIN <=1 SENSITIVE Sensitive     IMIPENEM 1 SENSITIVE Sensitive     TRIMETH/SULFA <=20 SENSITIVE Sensitive     AMPICILLIN/SULBACTAM <=2 SENSITIVE Sensitive     PIP/TAZO <=4 SENSITIVE Sensitive     * FEW PROTEUS MIRABILIS   Staphylococcus aureus - MIC*    CIPROFLOXACIN >=8 RESISTANT Resistant     ERYTHROMYCIN >=8 RESISTANT Resistant     GENTAMICIN <=0.5 SENSITIVE Sensitive     OXACILLIN 0.5 SENSITIVE Sensitive     TETRACYCLINE <=1 SENSITIVE Sensitive     VANCOMYCIN <=0.5 SENSITIVE Sensitive     TRIMETH/SULFA <=10 SENSITIVE Sensitive     CLINDAMYCIN <=0.25 SENSITIVE Sensitive     RIFAMPIN <=0.5 SENSITIVE Sensitive     Inducible Clindamycin NEGATIVE Sensitive     * FEW STAPHYLOCOCCUS AUREUS    RADIOLOGY:  No results found.    Management plans discussed with the patient, family and they are in agreement.  CODE STATUS:     Code Status Orders        Start     Ordered   11/23/16 1334  Full code  Continuous     11/23/16 1333      TOTAL TIME TAKING CARE OF THIS PATIENT: 40 minutes.  Houston Siren M.D on 11/30/2016 at 11:52 AM  Between 7am to 6pm - Pager - [phone]  After 6pm go to [email or web address] - Social research officer, government  [facility] [place] [facility]  Office  [phone]  CC: Primary care physician; Lindwood Qua, MD

## 2016-12-14 ENCOUNTER — Encounter: Payer: Medicare Other | Admitting: Surgery

## 2016-12-14 DIAGNOSIS — E1040 Type 1 diabetes mellitus with diabetic neuropathy, unspecified: Secondary | ICD-10-CM | POA: Diagnosis not present

## 2016-12-14 DIAGNOSIS — L03116 Cellulitis of left lower limb: Secondary | ICD-10-CM | POA: Diagnosis not present

## 2016-12-14 DIAGNOSIS — Z794 Long term (current) use of insulin: Secondary | ICD-10-CM | POA: Diagnosis not present

## 2016-12-14 DIAGNOSIS — E10621 Type 1 diabetes mellitus with foot ulcer: Secondary | ICD-10-CM | POA: Diagnosis not present

## 2016-12-14 DIAGNOSIS — Z880 Allergy status to penicillin: Secondary | ICD-10-CM | POA: Diagnosis not present

## 2016-12-14 DIAGNOSIS — J449 Chronic obstructive pulmonary disease, unspecified: Secondary | ICD-10-CM | POA: Diagnosis not present

## 2016-12-14 DIAGNOSIS — E1022 Type 1 diabetes mellitus with diabetic chronic kidney disease: Secondary | ICD-10-CM | POA: Diagnosis not present

## 2016-12-14 DIAGNOSIS — Z87891 Personal history of nicotine dependence: Secondary | ICD-10-CM | POA: Diagnosis not present

## 2016-12-14 DIAGNOSIS — L97528 Non-pressure chronic ulcer of other part of left foot with other specified severity: Secondary | ICD-10-CM | POA: Diagnosis not present

## 2016-12-14 DIAGNOSIS — Z992 Dependence on renal dialysis: Secondary | ICD-10-CM | POA: Diagnosis not present

## 2016-12-14 DIAGNOSIS — N186 End stage renal disease: Secondary | ICD-10-CM | POA: Diagnosis not present

## 2016-12-16 NOTE — Progress Notes (Signed)
Marcus MuttersSTALEY, [name] ([id number]) Visit Report for 12/14/2016 Arrival Information Details Patient Name: Marcus MuttersSTALEY, [name] Date of Service: 12/14/2016 9:15 AM Medical Record Number: [id number] Patient Account Number: [id number] Date of Birth/Sex: 1956-01-24 20(61 y.o. Male) Treating RN: Curtis Sitesorthy, Joanna Primary Care Armando Bukhari: Lindwood QuaHOFFMAN, BYRON Other Clinician: Referring Sahej Schrieber: Lindwood QuaHOFFMAN, BYRON Treating Eliora Nienhuis/Extender: Rudene ReBritto, Errol Weeks in Treatment: 3 Visit Information History Since Last Visit Added or deleted any medications: No Patient Arrived: Wheel Chair Any new allergies or adverse reactions: No Arrival Time: 08:42 Had a fall or experienced change in No Accompanied By: staff activities of daily living that may affect Transfer Assistance: None risk of falls: Patient Identification Verified: Yes Signs or symptoms of abuse/neglect since last No Secondary Verification Process Yes visito Completed: Hospitalized since last visit: No Patient Has Alerts: Yes Has Dressing in Place as Prescribed: Yes Patient Alerts: Patient on Blood Pain Present Now: No Thinner DMI Heparin Electronic Signature(s) Signed: 12/14/2016 4:50:02 PM By: Curtis Sitesorthy, Joanna Entered By: Curtis Sitesorthy, Joanna on 12/14/2016 08:42:50 Marcus MuttersSTALEY, [name] ([id number]) -------------------------------------------------------------------------------- Encounter Discharge Information Details Patient Name: Marcus MuttersSTALEY, [name] Date of Service: 12/14/2016 9:15 AM Medical Record Number: [id number] Patient Account Number: [id number] Date of Birth/Sex: 1956-01-24 23(61 y.o. Male) Treating RN: Curtis Sitesorthy, Joanna Primary Care Eutimio Gharibian: Lindwood QuaHOFFMAN, BYRON Other Clinician: Referring Kyrielle Urbanski: Lindwood QuaHOFFMAN, BYRON Treating Yeslin Delio/Extender: Rudene ReBritto, Errol Weeks in Treatment: 3 Encounter Discharge Information Items Discharge Pain Level: 0 Discharge Condition: Stable Ambulatory Status: Wheelchair Discharge Destination: Nursing Home Transportation: Private  Auto Accompanied By: staff Schedule Follow-up Appointment: No Medication Reconciliation completed and provided to Patient/Care No Iam Lipson: Provided on Clinical Summary of Care: 12/14/2016 Form Type Recipient Paper Patient CS Electronic Signature(s) Signed: 12/14/2016 1:33:55 PM By: Curtis Sitesorthy, Joanna Entered By: Curtis Sitesorthy, Joanna on 12/14/2016 13:33:55 Marcus MuttersSTALEY, [name] ([id number]) -------------------------------------------------------------------------------- Lower Extremity Assessment Details Patient Name: Marcus MuttersSTALEY, [name] Date of Service: 12/14/2016 9:15 AM Medical Record Number: [id number] Patient Account Number: [id number] Date of Birth/Sex: 1956-01-24 [age] y.o. Male) Treating RN: Curtis Sitesorthy, Joanna Primary Care Yuval Rubens: Lindwood QuaHOFFMAN, BYRON Other Clinician: Referring Aline Wesche: Lindwood QuaHOFFMAN, BYRON Treating Shalondra Wunschel/Extender: Rudene ReBritto, Errol Weeks in Treatment: 3 Vascular Assessment Pulses: Dorsalis Pedis Palpable: [Left:Yes] Posterior Tibial Extremity colors, hair growth, and conditions: Extremity Color: [Left:Hyperpigmented] Hair Growth on Extremity: [Left:No] Temperature of Extremity: [Left:Warm] Capillary Refill: [Left:< 3 seconds] Electronic Signature(s) Signed: 12/14/2016 4:50:02 PM By: Curtis Sitesorthy, Joanna Entered By: Curtis Sitesorthy, Joanna on 12/14/2016 09:02:04 Marcus MuttersSTALEY, [name] ([id number]) -------------------------------------------------------------------------------- Multi Wound Chart Details Patient Name: Marcus MuttersSTALEY, [name] Date of Service: 12/14/2016 9:15 AM Medical Record Number: [id number] Patient Account Number: [id number] Date of Birth/Sex: 1956-01-24 26(61 y.o. Male) Treating RN: Curtis Sitesorthy, Joanna Primary Care Maanasa Aderhold: Lindwood QuaHOFFMAN, BYRON Other Clinician: Referring Telma Pyeatt: Lindwood QuaHOFFMAN, BYRON Treating Han Vejar/Extender: Rudene ReBritto, Errol Weeks in Treatment: 3 Vital Signs Height(in): 74 Pulse(bpm): 67 Weight(lbs): 152 Blood Pressure 96/48 (mmHg): Body Mass Index(BMI): 20 Temperature(F):  97.9 Respiratory Rate 16 (breaths/min): Photos: [1:No Photos] [2:No Photos] [N/A:N/A] Wound Location: [1:Left, Lateral Foot] [2:Left Amputation Site - Digit] [N/A:N/A] Wounding Event: [1:Not Known] [2:Surgical Injury] [N/A:N/A] Primary Etiology: [1:Diabetic Wound/Ulcer of the Lower Extremity] [2:Open Surgical Wound] [N/A:N/A] Comorbid History: [1:N/A] [2:Chronic Obstructive Pulmonary Disease (COPD), Hypertension, Type I Diabetes, Neuropathy] [N/A:N/A] Date Acquired: [1:11/20/2016] [2:11/25/2016] [N/A:N/A] Weeks of Treatment: [1:3] [2:0] [N/A:N/A] Wound Status: [1:Amputation] [2:Open] [N/A:N/A] Pending Amputation on Yes [2:No] [N/A:N/A] Presentation: Measurements L x W x D N/A [2:2.5x10x0.1] [N/A:N/A] (cm) Area (cm) : [1:N/A] [2:19.635] [N/A:N/A] Volume (cm) : [1:N/A] [2:1.963] [N/A:N/A] Classification: [1:Grade 1] [2:Full Thickness Without Exposed Support Structures] [N/A:N/A] Exudate Amount: [1:N/A] [2:Large] [N/A:N/A] Exudate Type: [1:N/A] [2:Sanguinous] [N/A:N/A] Exudate  Color: [1:N/A] [2:red] [N/A:N/A] Foul Odor After [1:N/A] [2:Yes] [N/A:N/A] Cleansing: Odor Anticipated Due to [1:N/A] [2:No] [N/A:N/A] Product Use: ORIE, BAXENDALE ([id number]) Wound Margin: N/A Flat and Intact N/A Granulation Amount: N/A None Present (0%) N/A Necrotic Amount: N/A None Present (0%) N/A Epithelialization: N/A None N/A Periwound Skin Texture: No Abnormalities Noted Excoriation: No N/A Induration: No Callus: No Crepitus: No Rash: No Scarring: No Periwound Skin No Abnormalities Noted Maceration: No N/A Moisture: Dry/Scaly: No Periwound Skin Color: No Abnormalities Noted Atrophie Blanche: No N/A Cyanosis: No Ecchymosis: No Erythema: No Hemosiderin Staining: No Mottled: No Pallor: No Rubor: No Temperature: N/A No Abnormality N/A Tenderness on No Yes N/A Palpation: Wound Preparation: N/A Ulcer Cleansing: N/A Rinsed/Irrigated with Saline Topical Anesthetic Applied:  None Procedures Performed: N/A Incision and Drainage N/A Treatment Notes Electronic Signature(s) Signed: 12/14/2016 4:28:32 PM By: Evlyn Kanner MD, FACS Entered By: Evlyn Kanner on 12/14/2016 09:25:37 Marcus [name] ([id number]) -------------------------------------------------------------------------------- Pain Assessment Details Patient Name: Marcus [name] Date of Service: 12/14/2016 9:15 AM Medical Record Number: [id number] Patient Account Number: [id number] Date of Birth/Sex: 24-Apr-1955 (61 y.o. Male) Treating RN: Curtis Sites Primary Care Noor Witte: Lindwood Qua Other Clinician: Referring Teaghan Melrose: Lindwood Qua Treating Lynnel Zanetti/Extender: Rudene Re in Treatment: 3 Active Problems Location of Pain Severity and Description of Pain Patient Has Paino No Site Locations Pain Management and Medication Current Pain Management: Notes Topical or injectable lidocaine is offered to patient for acute pain when surgical debridement is performed. If needed, Patient is instructed to use over the counter pain medication for the following 24-48 hours after debridement. Wound care MDs do not prescribed pain medications. Patient has chronic pain or uncontrolled pain. Patient has been instructed to make an appointment with their Primary Care Physician for pain management. Electronic Signature(s) Signed: 12/14/2016 4:50:02 PM By: Curtis Sites Entered By: Curtis Sites on 12/14/2016 08:42:57 Marcus [name] ([id number]) -------------------------------------------------------------------------------- Patient/Caregiver Education Details Patient Name: Marcus [name] Date of Service: 12/14/2016 9:15 AM Medical Record Number: [id number] Patient Account Number: [id number] Date of Birth/Gender: 21-Feb-1956 (61 y.o. Male) Treating RN: Curtis Sites Primary Care Physician: Lindwood Qua Other Clinician: Referring Physician: Lindwood Qua Treating Physician/Extender:  Rudene Re in Treatment: 3 Education Assessment Education Provided To: Caregiver SNF nurse via written orders Education Topics Provided Wound/Skin Impairment: Handouts: Other: wound care orders Methods: Printed Electronic Signature(s) Signed: 12/14/2016 4:50:02 PM By: Curtis Sites Entered By: Curtis Sites on 12/14/2016 13:34:20 Marcus [name] ([id number]) -------------------------------------------------------------------------------- Wound Assessment Details Patient Name: Marcus [name] Date of Service: 12/14/2016 9:15 AM Medical Record Number: [id number] Patient Account Number: [id number] Date of Birth/Sex: 24-Dec-1955 (61 y.o. Male) Treating RN: Curtis Sites Primary Care Jenyfer Trawick: Lindwood Qua Other Clinician: Referring Jaicey Sweaney: Lindwood Qua Treating Zilda No/Extender: Rudene Re in Treatment: 3 Wound Status Wound Number: 1 Primary Diabetic Wound/Ulcer of the Lower Etiology: Extremity Wound Location: Left, Lateral Foot Wound Status: Amputation Wounding Event: Not Known Outcome Digit/Ray Date Acquired: 11/20/2016 Level: Weeks Of Treatment: 3 Clustered Wound: No Pending Amputation On Presentation Wound Description Classification: Grade 1 Periwound Skin Texture Texture Color No Abnormalities Noted: No No Abnormalities Noted: No Moisture No Abnormalities Noted: No Electronic Signature(s) Signed: 12/14/2016 4:50:02 PM By: Curtis Sites Entered By: Curtis Sites on 12/14/2016 08:58:35 Marcus [name] ([id number]) -------------------------------------------------------------------------------- Wound Assessment Details Patient Name: Marcus [name] Date of Service: 12/14/2016 9:15 AM Medical Record Number: [id number] Patient Account Number: [id number] Date of Birth/Sex: [birth year] (61 y.o. Male) Treating RN: Curtis Sites Primary Care Fahim Kats: Lindwood Qua Other Clinician: Referring  Alizay Bronkema: Lindwood Qua Treating  Taelor Waymire/Extender: Rudene Re in Treatment: 3 Wound Status Wound Number: 2 Primary Open Surgical Wound Etiology: Wound Location: Left Amputation Site - Digit Wound Open Wounding Event: Surgical Injury Status: Date Acquired: 11/25/2016 Comorbid Chronic Obstructive Pulmonary Weeks Of Treatment: 0 History: Disease (COPD), Hypertension, Type I Clustered Wound: No Diabetes, Neuropathy Photos Photo Uploaded By: Curtis Sites on 12/14/2016 13:09:55 Wound Measurements Length: (cm) 2.5 Width: (cm) 10 Depth: (cm) 0.1 Area: (cm) 19.635 Volume: (cm) 1.963 % Reduction in Area: % Reduction in Volume: Epithelialization: None Tunneling: No Undermining: No Wound Description Full Thickness Without Exposed Classification: Support Structures Wound Margin: Flat and Intact Exudate Large Amount: Exudate Type: Sanguinous Exudate Color: red Foul Odor After Cleansing: Yes Due to Product Use: No Slough/Fibrino No Wound Bed Granulation Amount: None Present (0%) Exposed Structure Necrotic Amount: None Present (0%) Fascia Exposed: No Younge, Edword O. ([id number]) Fat Layer (Subcutaneous Tissue) Exposed: No Tendon Exposed: No Muscle Exposed: No Joint Exposed: No Bone Exposed: No Periwound Skin Texture Texture Color No Abnormalities Noted: No No Abnormalities Noted: No Callus: No Atrophie Blanche: No Crepitus: No Cyanosis: No Excoriation: No Ecchymosis: No Induration: No Erythema: No Rash: No Hemosiderin Staining: No Scarring: No Mottled: No Pallor: No Moisture Rubor: No No Abnormalities Noted: No Dry / Scaly: No Temperature / Pain Maceration: No Temperature: No Abnormality Tenderness on Palpation: Yes Wound Preparation Ulcer Cleansing: Rinsed/Irrigated with Saline Topical Anesthetic Applied: None Treatment Notes Wound #2 (Left Amputation Site - Digit) 1. Cleansed with: Clean wound with Normal Saline 4. Dressing Applied: Dry Gauze 5. Secondary  Dressing Applied ABD Pad Kerlix/Conform 7. Secured with Secretary/administrator) Signed: 12/14/2016 4:50:02 PM By: Curtis Sites Entered By: Curtis Sites on 12/14/2016 09:01:45 Marcus [name] ([id number]) -------------------------------------------------------------------------------- Vitals Details Patient Name: Marcus [name] Date of Service: 12/14/2016 9:15 AM Medical Record Number: [id number] Patient Account Number: [id number] Date of Birth/Sex: 1955/04/24 (61 y.o. Male) Treating RN: Curtis Sites Primary Care Shakeda Pearse: Lindwood Qua Other Clinician: Referring Kauri Garson: Lindwood Qua Treating Khaniya Tenaglia/Extender: Rudene Re in Treatment: 3 Vital Signs Time Taken: 08:43 Temperature (F): 97.9 Height (in): 74 Pulse (bpm): 67 Source: Measured Respiratory Rate (breaths/min): 16 Weight (lbs): 152 Blood Pressure (mmHg): 96/48 Source: Measured Reference Range: 80 - 120 mg / dl Body Mass Index (BMI): 19.5 Electronic Signature(s) Signed: 12/14/2016 4:50:02 PM By: Curtis Sites Entered By: Curtis Sites on 12/14/2016 08:44:45

## 2016-12-16 NOTE — Progress Notes (Signed)
ROCKIE, SCHNOOR ([id number]) Visit Report for 12/14/2016 Chief Complaint Document Details Patient Name: Marcus [name], Marcus [name]. Date of Service: 12/14/2016 9:15 AM Medical Record Number: [id number] Patient Account Number: [id number] Date of Birth/Sex: 1955/05/27 (61 y.o. Male) Treating RN: Curtis Sites Primary Care Provider: Lindwood Qua Other Clinician: Referring Provider: Lindwood Qua Treating Provider/Extender: Rudene Re in Treatment: 3 Information Obtained from: Patient Chief Complaint Left 5th Metatarsal Ulcer Electronic Signature(s) Signed: 12/14/2016 4:28:32 PM By: Evlyn Kanner MD, FACS Entered By: Evlyn Kanner on 12/14/2016 09:27:15 Marcus [name] ([id number]) -------------------------------------------------------------------------------- HPI Details Patient Name: Marcus [name] Date of Service: 12/14/2016 9:15 AM Medical Record Number: [id number] Patient Account Number: [id number] Date of Birth/Sex: 1956-01-05 (61 y.o. Male) Treating RN: Curtis Sites Primary Care Provider: Lindwood Qua Other Clinician: Referring Provider: Lindwood Qua Treating Provider/Extender: Rudene Re in Treatment: 3 History of Present Illness HPI Description: 11/23/16 on evaluation today patient presented with a significant ulcer of his left lateral foot at the fifth metatarsal region. From best I can tell based on the skin integrity report received from his nursing facility it appears that he has had this wound since 10/04/16 or at least that's when it was initially evaluated. The wound appears to have consistently been enlarging according to documentation with some notation of odor occasionally. Fortunately he has not been experiencing pain significantly at the site according to documentation as well. With that being said on evaluation today there appears to be frothy bloody discharge from the wound. There is also surrounding erythema noted on evaluation today. No  fevers, chills, nausea, or vomiting noted at this time. Otherwise patient was not able to provide much more in the way of history. He does not appear to be having pain on evaluation today. 12/14/16 -- was recently admitted to the hospital between 11/23/2016 and 11/30/2016 with osteomyelitis and pressure injury of the skin. He is known to have end-stage renal disease on hemodialysis, diabetes mellitus, hypothyroidism, GERD, essential hypertension, COPD, history of DVT, previous CVA and was admitted to the hospital due to left foot osteomyelitis which was in the fifth toe. 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 He was treated with broad-spectrum antibiotics including IV Ancef, Flagyl and was seen by infectious disease and was discharged on IV Ancef at dialysis and oral Flagyl to be stopped on 12/22/2016. A angiogram was done which showed no significant atherosclerotic disease. The patient was just attached to skilled nursing facility. Abdominal aortogram done on 11/29/2016 with the left lower extremity angiography. The patient did not have hemodynamically significant atherosclerotic occlusive disease and has a very poor cardiac output and this may be contributing to his poor wound healing. after seeing the patient I have spoken to Dr. Liz Beach who is his surgeon and discussed with him the condition of the wound and the fact that the patient came here by mistake instead of seeing Dr. Graciela Husbands in the postoperative period. He has asked me to send him to his office for an early appointment Electronic Signature(s) Signed: 12/14/2016 4:28:32 PM By: Evlyn Kanner MD, FACS Entered By: Evlyn Kanner on 12/14/2016 09:27:51 Marcus [name] ([id number]) -------------------------------------------------------------------------------- Incision and Drainage Details Patient Name: Marcus [name] Date of Service: 12/14/2016 9:15 AM Medical Record Number: [id number] Patient Account Number: [id number] Date of Birth/Sex: Feb 12, 1956 (61 y.o. Male) Treating RN: Curtis Sites Primary Care Provider: Lindwood Qua Other Clinician: Referring Provider: Lindwood Qua Treating Provider/Extender:  Shaden Lacher Weeks in Treatment: 3 Incision And Drainage Wound #2 Left Amputation Site - Digit Performed for: Performed By: Physician Evlyn Kanner, MD Incision And Drainage Hematoma / Seroma Type: Location: wound number 2 - left 5th toe amputation site Pre-procedure Yes - 09:10 Verification/Time Out Taken: Pain Control: Lidocaine 4% Topical Solution Drainage Of: Sero-Sanguineous Bleeding: Moderate Hemostasis Achieved: Pressure Culture Sent: None Procedural Pain: 0 Post Procedural Pain: 0 Response to Procedure was tolerated well Treatment: Post Procedure Diagnosis Same as Pre-procedure Notes the patient has a sutured wound on the left lateral forefoot in the region of the ray amputation of the fifth metatarsal. There was a fluctuant collection under the skin which was easily removed between the sutures and about 50 mL of altered hematoma was removed. No sutures were removed and a dressing applied in place appropriately. Electronic Signature(s) Signed: 12/14/2016 4:28:32 PM By: Evlyn Kanner MD, FACS Entered By: Evlyn Kanner on 12/14/2016 09:27:08 Marcus [name] ([id number]) -------------------------------------------------------------------------------- Physical Exam Details Patient Name: Marcus [name] Date of Service: 12/14/2016 9:15 AM Medical Record Number: [id number] Patient Account Number: [id number] Date of Birth/Sex: 09/09/1955 (61 y.o. Male) Treating RN: Curtis Sites Primary Care Provider: Lindwood Qua Other Clinician: Referring Provider: Lindwood Qua Treating Provider/Extender: Rudene Re in Treatment: 3 Constitutional . Pulse regular.  Respirations normal and unlabored. Afebrile. . Eyes Nonicteric. Reactive to light. Ears, Nose, Mouth, and Throat Lips, teeth, and gums WNL.Marland Kitchen Moist mucosa without lesions. Neck supple and nontender. No palpable supraclavicular or cervical adenopathy. Normal sized without goiter. Respiratory WNL. No retractions.. Cardiovascular Pedal Pulses WNL. No clubbing, cyanosis or edema. Lymphatic No adneopathy. No adenopathy. No adenopathy. Musculoskeletal Adexa without tenderness or enlargement.. Digits and nails w/o clubbing, cyanosis, infection, petechiae, ischemia, or inflammatory conditions.. Integumentary (Hair, Skin) No suspicious lesions. No crepitus or fluctuance. No peri-wound warmth or erythema. No masses.Marland Kitchen Psychiatric Judgement and insight Intact.. No evidence of depression, anxiety, or agitation.. Notes the patient has a sutured wound on the left lateral forefoot in the region of the ray amputation of the fifth metatarsal. There was a fluctuant collection under the skin which was easily removed between the sutures and about 50 mL of altered hematoma was removed. No sutures were removed and a dressing applied in place appropriately. Electronic Signature(s) Signed: 12/14/2016 4:28:32 PM By: Evlyn Kanner MD, FACS Entered By: Evlyn Kanner on 12/14/2016 78:29:56 Marcus [name] ([id number]) -------------------------------------------------------------------------------- Physician Orders Details Patient Name: Marcus [name] Date of Service: 12/14/2016 9:15 AM Medical Record Number: [id number] Patient Account Number: [id number] Date of Birth/Sex: Jan 06, 1956 (60 y.o. Male) Treating RN: Curtis Sites Primary Care Provider: Lindwood Qua Other Clinician: Referring Provider: Lindwood Qua Treating Provider/Extender: Rudene Re in Treatment: 3 Verbal / Phone Orders: No Diagnosis Coding Wound Cleansing Wound #2 Left Amputation Site - Digit o Cleanse wound with mild  soap and water o May Shower, gently pat wound dry prior to applying new dressing. o No tub bath. Primary Wound Dressing Wound #2 Left Amputation Site - Digit o Dry Gauze - DO NOT USE FOAM - THIS STICKS TO WOUND AND SUTURES Secondary Dressing Wound #2 Left Amputation Site - Digit o ABD and Kerlix/Conform - DO NOT WRAP TIGHT Dressing Change Frequency Wound #2 Left Amputation Site - Digit o Change dressing every day. Follow-up Appointments Wound #2 Left Amputation Site - Digit o Other: - Please follow up with Dr Alberteen Spindle and return to [facility] only if needed Electronic Signature(s) Signed: 12/14/2016 4:28:32 PM By: Evlyn Kanner MD, FACS  Signed: 12/14/2016 4:50:02 PM By: Curtis Sitesorthy, Joanna Entered By: Curtis Sitesorthy, Joanna on 12/14/2016 09:20:16 Marcus [name], Vashawn O. ([id number]) -------------------------------------------------------------------------------- Problem List Details Patient Name: Marcus [name], Eri O. Date of Service: 12/14/2016 9:15 AM Medical Record Number: [id number] Patient Account Number: [id number] Date of Birth/Sex: 13-Mar-1956 [age] y.o. Male) Treating RN: Curtis Sitesorthy, Joanna Primary Care Provider: Lindwood QuaHOFFMAN, BYRON Other Clinician: Referring Provider: Lindwood QuaHOFFMAN, BYRON Treating Provider/Extender: Rudene ReBritto, Kammy Klett Weeks in Treatment: 3 Active Problems ICD-10 Encounter Code Description Active Date Diagnosis E11.621 Type 2 diabetes mellitus with foot ulcer 11/24/2016 Yes L97.528 Non-pressure chronic ulcer of other part of left foot with 11/24/2016 Yes other specified severity L03.116 Cellulitis of left lower limb 11/24/2016 Yes T87.81 Dehiscence of amputation stump 12/14/2016 Yes Inactive Problems Resolved Problems Electronic Signature(s) Signed: 12/14/2016 9:25:22 AM By: Evlyn KannerBritto, Christphor Groft MD, FACS Entered By: Evlyn KannerBritto, Indio Santilli on 12/14/2016 09:25:21 Marcus [name], Garet O. ([id number]) -------------------------------------------------------------------------------- Progress Note  Details Patient Name: Marcus [name], [name] Date of Service: 12/14/2016 9:15 AM Medical Record Number: [id number] Patient Account Number: [id number] Date of Birth/Sex: 13-Mar-1956 [age] y.o. Male) Treating RN: Curtis Sitesorthy, Joanna Primary Care Provider: Lindwood QuaHOFFMAN, BYRON Other Clinician: Referring Provider: Lindwood QuaHOFFMAN, BYRON Treating Provider/Extender: Rudene ReBritto, Quinterious Walraven Weeks in Treatment: 3 Subjective Chief Complaint Information obtained from Patient Left 5th Metatarsal Ulcer History of Present Illness (HPI) 11/23/16 on evaluation today patient presented with a significant ulcer of his left lateral foot at the fifth metatarsal region. From best I can tell based on the skin integrity report received from his nursing facility it appears that he has had this wound since 10/04/16 or at least that's when it was initially evaluated. The wound appears to have consistently been enlarging according to documentation with some notation of odor occasionally. Fortunately he has not been experiencing pain significantly at the site according to documentation as well. With that being said on evaluation today there appears to be frothy bloody discharge from the wound. There is also surrounding erythema noted on evaluation today. No fevers, chills, nausea, or vomiting noted at this time. Otherwise patient was not able to provide much more in the way of history. He does not appear to be having pain on evaluation today. 12/14/16 -- was recently admitted to the hospital between 11/23/2016 and 11/30/2016 with osteomyelitis and pressure injury of the skin. He is known to have end-stage renal disease on hemodialysis, diabetes mellitus, hypothyroidism, GERD, essential hypertension, COPD, history of DVT, previous CVA and was admitted to the hospital due to left foot osteomyelitis which was in the fifth toe. 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 He was treated with broad-spectrum antibiotics including IV Ancef, Flagyl and was seen by infectious disease and was discharged on IV Ancef at dialysis and oral Flagyl to be stopped on 12/22/2016. A angiogram was done which showed no significant atherosclerotic disease. The patient was just attached to skilled nursing facility. Abdominal aortogram done on 11/29/2016 with the left lower extremity angiography. The patient did not have hemodynamically significant atherosclerotic occlusive disease and has a very poor cardiac output and this may be contributing to his poor wound healing. after seeing the patient I have spoken to Dr. Liz Beachodd Klein who is his surgeon and discussed with him the condition of the wound and the fact that the patient came here by mistake instead of seeing Dr. Graciela HusbandsKlein in the postoperative period. He has asked me to send him to his office for an early appointment Marcus [name], [name] ([id number]) Objective  Constitutional Pulse regular. Respirations normal and unlabored. Afebrile. Vitals Time Taken: 8:43 AM, Height: 74 in, Source: Measured, Weight: 152 lbs, Source: Measured, BMI: 19.5, Temperature: 97.9 F, Pulse: 67 bpm, Respiratory Rate: 16 breaths/min, Blood Pressure: 96/48 mmHg. Eyes Nonicteric. Reactive to light. Ears, Nose, Mouth, and Throat Lips, teeth, and gums WNL.Marland Kitchen Moist mucosa without lesions. Neck supple and nontender. No palpable supraclavicular or cervical adenopathy. Normal sized without goiter. Respiratory WNL. No retractions.. Cardiovascular Pedal Pulses WNL. No clubbing, cyanosis or edema. Lymphatic No adneopathy. No adenopathy. No adenopathy. Musculoskeletal Adexa without tenderness or enlargement.. Digits and nails w/o clubbing, cyanosis, infection, petechiae, ischemia, or inflammatory conditions.Marland Kitchen Psychiatric Judgement and insight Intact.. No evidence of depression, anxiety, or agitation.. General Notes: the patient has a sutured  wound on the left lateral forefoot in the region of the ray amputation of the fifth metatarsal. There was a fluctuant collection under the skin which was easily removed between the sutures and about 50 mL of altered hematoma was removed. No sutures were removed and a dressing applied in place appropriately. Integumentary (Hair, Skin) No suspicious lesions. No crepitus or fluctuance. No peri-wound warmth or erythema. No masses.. Wound #1 status is Amputation. Original cause of wound was Not Known. The wound is located on the Left,Lateral Foot. Marcus [name], Marcus [name] ([id number]) Wound #2 status is Open. Original cause of wound was Surgical Injury. The wound is located on the Left Amputation Site - Digit. The wound measures 2.5cm length x 10cm width x 0.1cm depth; 19.635cm^2 area and 1.963cm^3 volume. There is no tunneling or undermining noted. There is a large amount of sanguinous drainage noted. Foul odor after cleansing was noted. The wound margin is flat and intact. There is no granulation within the wound bed. There is no necrotic tissue within the wound bed. The periwound skin appearance did not exhibit: Callus, Crepitus, Excoriation, Induration, Rash, Scarring, Dry/Scaly, Maceration, Atrophie Blanche, Cyanosis, Ecchymosis, Hemosiderin Staining, Mottled, Pallor, Rubor, Erythema. Periwound temperature was noted as No Abnormality. The periwound has tenderness on palpation. Assessment Active Problems ICD-10 E11.621 - Type 2 diabetes mellitus with foot ulcer L97.528 - Non-pressure chronic ulcer of other part of left foot with other specified severity L03.116 - Cellulitis of left lower limb T87.81 - Dehiscence of amputation stump Procedures Wound #2 Pre-procedure diagnosis of Wound #2 is an Open Surgical Wound located on the Left Amputation Site - Digit . Hematoma / Seroma incision and drainage was provided by Evlyn Kanner, MD. The skin was cleansed and prepped with anti-septic followed by  pain control using Lidocaine 4% Topical Solution. An incision was made in the wound number 2 - left 5th toe amputation site. There was an immediate release of Sero- Sanguineous fluid. A Moderate amount of bleeding was controlled with Pressure. A time out was conducted at 09:10, prior to the start of the procedure. No culture was sent. The procedure was tolerated well with a pain level of 0 throughout and a pain level of 0 following the procedure. Post procedure Diagnosis Wound #2: Same as Pre-Procedure General Notes: the patient has a sutured wound on the left lateral forefoot in the region of the ray amputation of the fifth metatarsal. There was a fluctuant collection under the skin which was easily removed between the sutures and about 50 mL of altered hematoma was removed. No sutures were removed and a dressing applied in place appropriately.[address] Marcus [name], Marcus [name] ([id number]) Wound Cleansing: Wound #2 Left Amputation Site - Digit: Cleanse wound with mild soap and water May  Shower, gently pat wound dry prior to applying new dressing. No tub bath. Primary Wound Dressing: Wound #2 Left Amputation Site - Digit: Dry Gauze - DO NOT USE FOAM - THIS STICKS TO WOUND AND SUTURES Secondary Dressing: Wound #2 Left Amputation Site - Digit: ABD and Kerlix/Conform - DO NOT WRAP TIGHT Dressing Change Frequency: Wound #2 Left Amputation Site - Digit: Change dressing every day. Follow-up Appointments: Wound #2 Left Amputation Site - Digit: Other: - Please follow up with Dr Alberteen Spindle and return to [facility] only if needed the patient is here brought by the skilled nurse facility staff, in error instead of going to his podiatry surgeon Dr. Odessa Fleming office. After reviewing the wound and doing an appropriate dressing have spoken to Dr. Graciela Husbands and he agrees to see the patient in follow-up as soon as possible. The patient and transports staff have been given instructions  appropriately. Electronic Signature(s) Signed: 12/14/2016 4:28:32 PM By: Evlyn Kanner MD, FACS Entered By: Evlyn Kanner on 12/14/2016 09:31:15 Marcus [name] ([id number]) -------------------------------------------------------------------------------- SuperBill Details Patient Name: Marcus [name] Date of Service: 12/14/2016 Medical Record Number: [id number] Patient Account Number: [id number] Date of Birth/Sex: Jan 13, 1956 (61 y.o. Male) Treating RN: Curtis Sites Primary Care Provider: Lindwood Qua Other Clinician: Referring Provider: Lindwood Qua Treating Provider/Extender: Rudene Re in Treatment: 3 Diagnosis Coding ICD-10 Codes Code Description E11.621 Type 2 diabetes mellitus with foot ulcer L97.528 Non-pressure chronic ulcer of other part of left foot with other specified severity L03.116 Cellulitis of left lower limb T87.81 Dehiscence of amputation stump Facility Procedures CPT4: Description Modifier Quantity Code [id number] 10140 - IandD HEMATOMA SEROMA 1 ICD-10 Description Diagnosis E11.621 Type 2 diabetes mellitus with foot ulcer L97.528 Non-pressure chronic ulcer of other part of left foot with other specified severity  T87.81 Dehiscence of amputation stump Physician Procedures CPT4: Description Modifier Quantity Code [id number] 10140 - WC PHYS TX OF IandD HEMATOMA SEROMA 1 ICD-10 Description Diagnosis E11.621 Type 2 diabetes mellitus with foot ulcer L97.528 Non-pressure chronic ulcer of other part of left foot with other  specified severity T87.81 Dehiscence of amputation stump Electronic Signature(s) Signed: 12/14/2016 4:28:32 PM By: Evlyn Kanner MD, FACS Entered By: Evlyn Kanner on 12/14/2016 09:32:38

## 2017-01-15 DEATH — deceased
